# Patient Record
Sex: Male | Born: 1935
Health system: Southern US, Community
[De-identification: ages and names within clinical notes are randomized; demographics above are authoritative.]

## PROBLEM LIST (undated history)

## (undated) DIAGNOSIS — N189 Chronic kidney disease, unspecified: Secondary | ICD-10-CM

## (undated) DIAGNOSIS — S82401A Unspecified fracture of shaft of right fibula, initial encounter for closed fracture: Secondary | ICD-10-CM

## (undated) DIAGNOSIS — T884XXA Failed or difficult intubation, initial encounter: Secondary | ICD-10-CM

## (undated) DIAGNOSIS — R296 Repeated falls: Secondary | ICD-10-CM

## (undated) DIAGNOSIS — T4145XA Adverse effect of unspecified anesthetic, initial encounter: Secondary | ICD-10-CM

## (undated) DIAGNOSIS — E7800 Pure hypercholesterolemia, unspecified: Secondary | ICD-10-CM

## (undated) DIAGNOSIS — E039 Hypothyroidism, unspecified: Secondary | ICD-10-CM

## (undated) DIAGNOSIS — Z9289 Personal history of other medical treatment: Secondary | ICD-10-CM

## (undated) DIAGNOSIS — I2510 Atherosclerotic heart disease of native coronary artery without angina pectoris: Secondary | ICD-10-CM

## (undated) DIAGNOSIS — I82409 Acute embolism and thrombosis of unspecified deep veins of unspecified lower extremity: Secondary | ICD-10-CM

## (undated) DIAGNOSIS — I8390 Asymptomatic varicose veins of unspecified lower extremity: Secondary | ICD-10-CM

## (undated) DIAGNOSIS — J302 Other seasonal allergic rhinitis: Secondary | ICD-10-CM

## (undated) DIAGNOSIS — R32 Unspecified urinary incontinence: Secondary | ICD-10-CM

## (undated) DIAGNOSIS — I499 Cardiac arrhythmia, unspecified: Secondary | ICD-10-CM

## (undated) DIAGNOSIS — J439 Emphysema, unspecified: Secondary | ICD-10-CM

## (undated) DIAGNOSIS — IMO0001 Reserved for inherently not codable concepts without codable children: Secondary | ICD-10-CM

## (undated) DIAGNOSIS — I4891 Unspecified atrial fibrillation: Secondary | ICD-10-CM

## (undated) DIAGNOSIS — F32A Depression, unspecified: Secondary | ICD-10-CM

## (undated) DIAGNOSIS — R7989 Other specified abnormal findings of blood chemistry: Secondary | ICD-10-CM

## (undated) DIAGNOSIS — N400 Enlarged prostate without lower urinary tract symptoms: Secondary | ICD-10-CM

## (undated) DIAGNOSIS — F419 Anxiety disorder, unspecified: Secondary | ICD-10-CM

## (undated) DIAGNOSIS — T8859XA Other complications of anesthesia, initial encounter: Secondary | ICD-10-CM

## (undated) DIAGNOSIS — K219 Gastro-esophageal reflux disease without esophagitis: Secondary | ICD-10-CM

## (undated) DIAGNOSIS — R55 Syncope and collapse: Secondary | ICD-10-CM

## (undated) DIAGNOSIS — R011 Cardiac murmur, unspecified: Secondary | ICD-10-CM

## (undated) DIAGNOSIS — J449 Chronic obstructive pulmonary disease, unspecified: Secondary | ICD-10-CM

## (undated) DIAGNOSIS — I10 Essential (primary) hypertension: Secondary | ICD-10-CM

## (undated) DIAGNOSIS — M722 Plantar fascial fibromatosis: Secondary | ICD-10-CM

## (undated) DIAGNOSIS — F329 Major depressive disorder, single episode, unspecified: Secondary | ICD-10-CM

## (undated) DIAGNOSIS — M1990 Unspecified osteoarthritis, unspecified site: Secondary | ICD-10-CM

## (undated) DIAGNOSIS — G4733 Obstructive sleep apnea (adult) (pediatric): Secondary | ICD-10-CM

## (undated) DIAGNOSIS — I351 Nonrheumatic aortic (valve) insufficiency: Secondary | ICD-10-CM

## (undated) DIAGNOSIS — I429 Cardiomyopathy, unspecified: Secondary | ICD-10-CM

## (undated) DIAGNOSIS — R350 Frequency of micturition: Secondary | ICD-10-CM

## (undated) HISTORY — DX: Asymptomatic varicose veins of unspecified lower extremity: I83.90

## (undated) HISTORY — DX: Essential (primary) hypertension: I10

## (undated) HISTORY — DX: Personal history of other medical treatment: Z92.89

## (undated) HISTORY — DX: Nonrheumatic aortic (valve) insufficiency: I35.1

## (undated) HISTORY — DX: Cardiomyopathy, unspecified: I42.9

## (undated) HISTORY — DX: Hypothyroidism, unspecified: E03.9

## (undated) HISTORY — DX: Depression, unspecified: F32.A

## (undated) HISTORY — PX: CARDIAC CATHETERIZATION: SHX172

## (undated) HISTORY — DX: Other specified abnormal findings of blood chemistry: R79.89

## (undated) HISTORY — DX: Other seasonal allergic rhinitis: J30.2

## (undated) HISTORY — DX: Unspecified fracture of shaft of right fibula, initial encounter for closed fracture: S82.401A

## (undated) HISTORY — PX: TONSILLECTOMY: SUR1361

## (undated) HISTORY — PX: NOSE SURGERY: SHX723

## (undated) HISTORY — DX: Syncope and collapse: R55

## (undated) HISTORY — DX: Obstructive sleep apnea (adult) (pediatric): G47.33

## (undated) HISTORY — DX: Pure hypercholesterolemia, unspecified: E78.00

## (undated) HISTORY — PX: OTHER SURGICAL HISTORY: SHX169

## (undated) HISTORY — DX: Plantar fascial fibromatosis: M72.2

## (undated) HISTORY — DX: Major depressive disorder, single episode, unspecified: F32.9

## (undated) HISTORY — PX: HERNIA REPAIR: SHX51

## (undated) HISTORY — PX: ABDOMINAL AORTIC ANEURYSM REPAIR: SUR1152

## (undated) HISTORY — PX: BRAIN SURGERY: SHX531

---

## 1958-08-30 DIAGNOSIS — Z9289 Personal history of other medical treatment: Secondary | ICD-10-CM

## 1958-08-30 HISTORY — DX: Personal history of other medical treatment: Z92.89

## 2006-01-19 ENCOUNTER — Encounter: Admission: RE | Admit: 2006-01-19 | Discharge: 2006-01-19 | Payer: Self-pay | Admitting: Vascular Surgery

## 2006-01-31 ENCOUNTER — Ambulatory Visit (HOSPITAL_COMMUNITY): Admission: RE | Admit: 2006-01-31 | Discharge: 2006-01-31 | Payer: Self-pay | Admitting: Vascular Surgery

## 2006-03-10 ENCOUNTER — Inpatient Hospital Stay (HOSPITAL_COMMUNITY): Admission: RE | Admit: 2006-03-10 | Discharge: 2006-03-14 | Payer: Self-pay | Admitting: Vascular Surgery

## 2006-03-10 ENCOUNTER — Encounter (INDEPENDENT_AMBULATORY_CARE_PROVIDER_SITE_OTHER): Payer: Self-pay | Admitting: Specialist

## 2006-04-11 ENCOUNTER — Inpatient Hospital Stay (HOSPITAL_COMMUNITY): Admission: EM | Admit: 2006-04-11 | Discharge: 2006-04-13 | Payer: Self-pay | Admitting: Emergency Medicine

## 2007-06-13 ENCOUNTER — Ambulatory Visit: Payer: Self-pay | Admitting: Vascular Surgery

## 2008-11-06 ENCOUNTER — Encounter: Admission: RE | Admit: 2008-11-06 | Discharge: 2008-11-06 | Payer: Self-pay | Admitting: Specialist

## 2009-07-22 ENCOUNTER — Encounter: Admission: RE | Admit: 2009-07-22 | Discharge: 2009-07-22 | Payer: Self-pay | Admitting: Surgery

## 2009-08-30 HISTORY — PX: LAPAROSCOPIC CHOLECYSTECTOMY: SUR755

## 2009-09-16 ENCOUNTER — Ambulatory Visit (HOSPITAL_COMMUNITY): Admission: RE | Admit: 2009-09-16 | Discharge: 2009-09-17 | Payer: Self-pay | Admitting: Surgery

## 2009-09-16 ENCOUNTER — Encounter (INDEPENDENT_AMBULATORY_CARE_PROVIDER_SITE_OTHER): Payer: Self-pay | Admitting: Surgery

## 2009-10-10 ENCOUNTER — Emergency Department (HOSPITAL_COMMUNITY): Admission: EM | Admit: 2009-10-10 | Discharge: 2009-10-11 | Payer: Self-pay | Admitting: Emergency Medicine

## 2009-11-08 ENCOUNTER — Inpatient Hospital Stay (HOSPITAL_COMMUNITY): Admission: EM | Admit: 2009-11-08 | Discharge: 2009-11-15 | Payer: Self-pay | Admitting: Emergency Medicine

## 2009-11-28 HISTORY — PX: ERCP: SHX60

## 2009-12-03 ENCOUNTER — Ambulatory Visit (HOSPITAL_COMMUNITY): Admission: RE | Admit: 2009-12-03 | Discharge: 2009-12-03 | Payer: Self-pay | Admitting: Gastroenterology

## 2010-02-27 HISTORY — PX: LAPAROSCOPIC INCISIONAL / UMBILICAL / VENTRAL HERNIA REPAIR: SUR789

## 2010-03-27 ENCOUNTER — Inpatient Hospital Stay (HOSPITAL_COMMUNITY): Admission: RE | Admit: 2010-03-27 | Discharge: 2010-03-30 | Payer: Self-pay | Admitting: Surgery

## 2010-09-02 ENCOUNTER — Encounter
Admission: RE | Admit: 2010-09-02 | Discharge: 2010-09-02 | Payer: Self-pay | Source: Home / Self Care | Attending: Family Medicine | Admitting: Family Medicine

## 2010-11-14 LAB — CBC
HCT: 36.8 % — ABNORMAL LOW (ref 39.0–52.0)
HCT: 41.5 % (ref 39.0–52.0)
MCHC: 34.4 g/dL (ref 30.0–36.0)
MCV: 92.3 fL (ref 78.0–100.0)
Platelets: 180 10*3/uL (ref 150–400)
Platelets: 194 10*3/uL (ref 150–400)
RBC: 4.5 MIL/uL (ref 4.22–5.81)
RDW: 13.1 % (ref 11.5–15.5)
WBC: 6.2 10*3/uL (ref 4.0–10.5)
WBC: 6.4 10*3/uL (ref 4.0–10.5)

## 2010-11-14 LAB — BASIC METABOLIC PANEL
BUN: 24 mg/dL — ABNORMAL HIGH (ref 6–23)
Calcium: 8.6 mg/dL (ref 8.4–10.5)
Creatinine, Ser: 1.89 mg/dL — ABNORMAL HIGH (ref 0.4–1.5)
GFR calc non Af Amer: 35 mL/min — ABNORMAL LOW (ref >60)
Glucose, Bld: 86 mg/dL (ref 70–99)
Potassium: 4.2 mEq/L (ref 3.5–5.1)

## 2010-11-14 LAB — DIFFERENTIAL
Basophils Absolute: 0 10*3/uL (ref 0.0–0.1)
Basophils Relative: 1 % (ref 0–1)
Eosinophils Absolute: 0.1 10*3/uL (ref 0.0–0.7)
Monocytes Absolute: 0.4 10*3/uL (ref 0.1–1.0)
Neutro Abs: 3.4 10*3/uL (ref 1.7–7.7)
Neutrophils Relative %: 54 % (ref 43–77)

## 2010-11-14 LAB — COMPREHENSIVE METABOLIC PANEL
AST: 22 U/L (ref 0–37)
Alkaline Phosphatase: 72 U/L (ref 39–117)
Chloride: 108 mEq/L (ref 96–112)
GFR calc non Af Amer: 36 mL/min — ABNORMAL LOW (ref >60)
Potassium: 4.5 mEq/L (ref 3.5–5.1)
Sodium: 141 mEq/L (ref 135–145)
Total Bilirubin: 0.9 mg/dL (ref 0.3–1.2)

## 2010-11-14 LAB — SURGICAL PCR SCREEN: Staphylococcus aureus: NEGATIVE

## 2010-11-16 LAB — DIFFERENTIAL
Basophils Relative: 0 % (ref 0–1)
Eosinophils Absolute: 0.1 10*3/uL (ref 0.0–0.7)
Eosinophils Relative: 2 % (ref 0–5)
Lymphs Abs: 2.3 10*3/uL (ref 0.7–4.0)
Neutrophils Relative %: 53 % (ref 43–77)

## 2010-11-16 LAB — COMPREHENSIVE METABOLIC PANEL
ALT: 21 U/L (ref 0–53)
AST: 23 U/L (ref 0–37)
Alkaline Phosphatase: 72 U/L (ref 39–117)
CO2: 27 mEq/L (ref 19–32)
Calcium: 9.2 mg/dL (ref 8.4–10.5)
Chloride: 106 mEq/L (ref 96–112)
GFR calc Af Amer: 52 mL/min — ABNORMAL LOW (ref >60)
GFR calc non Af Amer: 43 mL/min — ABNORMAL LOW (ref >60)
Glucose, Bld: 84 mg/dL (ref 70–99)
Potassium: 4.5 mEq/L (ref 3.5–5.1)
Sodium: 141 mEq/L (ref 135–145)

## 2010-11-16 LAB — CBC
Hemoglobin: 13.7 g/dL (ref 13.0–17.0)
RBC: 4.43 MIL/uL (ref 4.22–5.81)
WBC: 6 10*3/uL (ref 4.0–10.5)

## 2010-11-18 LAB — COMPREHENSIVE METABOLIC PANEL
ALT: 31 U/L (ref 0–53)
AST: 41 U/L — ABNORMAL HIGH (ref 0–37)
Albumin: 3.4 g/dL — ABNORMAL LOW (ref 3.5–5.2)
Calcium: 8.9 mg/dL (ref 8.4–10.5)
Chloride: 107 mEq/L (ref 96–112)
Creatinine, Ser: 1.86 mg/dL — ABNORMAL HIGH (ref 0.4–1.5)
GFR calc Af Amer: 43 mL/min — ABNORMAL LOW (ref >60)
Sodium: 136 mEq/L (ref 135–145)

## 2010-11-18 LAB — URINALYSIS, ROUTINE W REFLEX MICROSCOPIC
Bilirubin Urine: NEGATIVE
Glucose, UA: NEGATIVE mg/dL
Hgb urine dipstick: NEGATIVE
Nitrite: NEGATIVE
Specific Gravity, Urine: 1.028 (ref 1.005–1.030)
pH: 5 (ref 5.0–8.0)

## 2010-11-18 LAB — DIFFERENTIAL
Eosinophils Absolute: 0.1 10*3/uL (ref 0.0–0.7)
Eosinophils Relative: 1 % (ref 0–5)
Lymphocytes Relative: 19 % (ref 12–46)
Lymphs Abs: 1.5 10*3/uL (ref 0.7–4.0)
Monocytes Absolute: 0.3 10*3/uL (ref 0.1–1.0)

## 2010-11-18 LAB — CBC
MCHC: 34.4 g/dL (ref 30.0–36.0)
MCV: 92.2 fL (ref 78.0–100.0)
Platelets: 272 10*3/uL (ref 150–400)
RBC: 4.17 MIL/uL — ABNORMAL LOW (ref 4.22–5.81)
WBC: 8.1 10*3/uL (ref 4.0–10.5)

## 2010-11-18 LAB — POCT CARDIAC MARKERS
CKMB, poc: 2.2 ng/mL (ref 1.0–8.0)
Myoglobin, poc: 140 ng/mL (ref 12–200)
Troponin i, poc: <0.05 ng/mL (ref 0.00–0.09)

## 2010-11-22 LAB — DIFFERENTIAL
Basophils Absolute: 0 10*3/uL (ref 0.0–0.1)
Eosinophils Absolute: 0 10*3/uL (ref 0.0–0.7)
Eosinophils Relative: 0 % (ref 0–5)
Eosinophils Relative: 1 % (ref 0–5)
Lymphocytes Relative: 15 % (ref 12–46)
Lymphs Abs: 1.2 10*3/uL (ref 0.7–4.0)
Lymphs Abs: 2 10*3/uL (ref 0.7–4.0)
Monocytes Relative: 5 % (ref 3–12)
Neutrophils Relative %: 78 % — ABNORMAL HIGH (ref 43–77)

## 2010-11-22 LAB — HEPATIC FUNCTION PANEL
ALT: 64 U/L — ABNORMAL HIGH (ref 0–53)
AST: 62 U/L — ABNORMAL HIGH (ref 0–37)
Bilirubin, Direct: 0.4 mg/dL — ABNORMAL HIGH (ref 0.0–0.3)
Indirect Bilirubin: 0.3 mg/dL (ref 0.3–0.9)
Total Bilirubin: 0.7 mg/dL (ref 0.3–1.2)
Total Bilirubin: 1.3 mg/dL — ABNORMAL HIGH (ref 0.3–1.2)
Total Protein: 6 g/dL (ref 6.0–8.3)

## 2010-11-22 LAB — COMPREHENSIVE METABOLIC PANEL
ALT: 216 U/L — ABNORMAL HIGH (ref 0–53)
AST: 171 U/L — ABNORMAL HIGH (ref 0–37)
AST: 22 U/L (ref 0–37)
AST: 246 U/L — ABNORMAL HIGH (ref 0–37)
Albumin: 2.5 g/dL — ABNORMAL LOW (ref 3.5–5.2)
BUN: 13 mg/dL (ref 6–23)
BUN: 13 mg/dL (ref 6–23)
CO2: 22 mEq/L (ref 19–32)
CO2: 26 mEq/L (ref 19–32)
Calcium: 8.3 mg/dL — ABNORMAL LOW (ref 8.4–10.5)
Calcium: 8.3 mg/dL — ABNORMAL LOW (ref 8.4–10.5)
Calcium: 9.2 mg/dL (ref 8.4–10.5)
Chloride: 103 mEq/L (ref 96–112)
Chloride: 99 mEq/L (ref 96–112)
Creatinine, Ser: 1.3 mg/dL (ref 0.4–1.5)
Creatinine, Ser: 1.38 mg/dL (ref 0.4–1.5)
GFR calc Af Amer: 53 mL/min — ABNORMAL LOW (ref >60)
GFR calc Af Amer: >60 mL/min (ref >60)
GFR calc non Af Amer: 44 mL/min — ABNORMAL LOW (ref >60)
GFR calc non Af Amer: 50 mL/min — ABNORMAL LOW (ref >60)
Glucose, Bld: 87 mg/dL (ref 70–99)
Potassium: 3.7 mEq/L (ref 3.5–5.1)
Sodium: 137 mEq/L (ref 135–145)
Total Bilirubin: 1 mg/dL (ref 0.3–1.2)
Total Protein: 6.1 g/dL (ref 6.0–8.3)

## 2010-11-22 LAB — CK TOTAL AND CKMB (NOT AT ARMC)
CK, MB: 2.1 ng/mL (ref 0.3–4.0)
Relative Index: INVALID (ref 0.0–2.5)

## 2010-11-22 LAB — CBC
HCT: 38.8 % — ABNORMAL LOW (ref 39.0–52.0)
Hemoglobin: 13.1 g/dL (ref 13.0–17.0)
MCHC: 33.9 g/dL (ref 30.0–36.0)
MCHC: 34.2 g/dL (ref 30.0–36.0)
MCHC: 34.8 g/dL (ref 30.0–36.0)
MCV: 93.7 fL (ref 78.0–100.0)
MCV: 93.9 fL (ref 78.0–100.0)
Platelets: 244 10*3/uL (ref 150–400)
RBC: 4.13 MIL/uL — ABNORMAL LOW (ref 4.22–5.81)
RBC: 4.23 MIL/uL (ref 4.22–5.81)
RDW: 14.7 % (ref 11.5–15.5)
WBC: 6.9 10*3/uL (ref 4.0–10.5)
WBC: 7.7 10*3/uL (ref 4.0–10.5)
WBC: 8.1 10*3/uL (ref 4.0–10.5)

## 2010-11-22 LAB — URINALYSIS, ROUTINE W REFLEX MICROSCOPIC
Glucose, UA: NEGATIVE mg/dL
Hgb urine dipstick: NEGATIVE
Specific Gravity, Urine: 1.01 (ref 1.005–1.030)

## 2010-11-22 LAB — RAPID URINE DRUG SCREEN, HOSP PERFORMED
Amphetamines: NOT DETECTED
Barbiturates: NOT DETECTED
Benzodiazepines: NOT DETECTED
Opiates: NOT DETECTED

## 2010-11-22 LAB — HEPATITIS PANEL, ACUTE: Hep B C IgM: NEGATIVE

## 2010-11-22 LAB — APTT: aPTT: 30 seconds (ref 24–37)

## 2010-11-22 LAB — LIPASE, BLOOD
Lipase: 1429 U/L — ABNORMAL HIGH (ref 11–59)
Lipase: 165 U/L — ABNORMAL HIGH (ref 11–59)
Lipase: 6078 U/L — ABNORMAL HIGH (ref 11–59)

## 2011-01-05 ENCOUNTER — Other Ambulatory Visit: Payer: Self-pay | Admitting: Family Medicine

## 2011-01-05 DIAGNOSIS — I714 Abdominal aortic aneurysm, without rupture: Secondary | ICD-10-CM

## 2011-01-08 ENCOUNTER — Other Ambulatory Visit: Payer: Self-pay | Admitting: Family Medicine

## 2011-01-08 ENCOUNTER — Ambulatory Visit
Admission: RE | Admit: 2011-01-08 | Discharge: 2011-01-08 | Disposition: A | Discharge status: Home / Self Care | Payer: Medicare Other | Source: Ambulatory Visit | Attending: Family Medicine | Admitting: Family Medicine

## 2011-01-08 ENCOUNTER — Other Ambulatory Visit: Payer: Medicare Other

## 2011-01-08 DIAGNOSIS — Z8679 Personal history of other diseases of the circulatory system: Secondary | ICD-10-CM

## 2011-01-08 DIAGNOSIS — I714 Abdominal aortic aneurysm, without rupture, unspecified: Secondary | ICD-10-CM

## 2011-01-08 DIAGNOSIS — Z9889 Other specified postprocedural states: Secondary | ICD-10-CM

## 2011-01-12 NOTE — Assessment & Plan Note (Signed)
OFFICE VISIT   MADOC, HOLQUIN  DOB:  [birth year]                                       06/13/2007  GEXBM#:[id number]   I saw the patient in the office today for continued followup after  aneurysm repair back in July of 2007.  Since I saw him last, he has had  no significant abdominal or back pain.  He did injure his left calf  muscle and tore this.   REVIEW OF SYSTEMS:  He has had no chest pain, chest pressure,  palpitations, or arrhythmias.  He does admit to some dyspnea on  exertion.  PULMONARY:  He has had no bronchitis, asthma, or wheezing.   PHYSICAL EXAMINATION:  Blood pressure 127/79, heart rate is 76.  I do  not detect any carotid bruits.  Lungs are clear bilaterally to  auscultation.  On cardiac exam, he has a regular rate and rhythm.  Abdomen is obese.  His abdomen is nontender.  He does have a ventral  hernia, which is easily reducible.  He has palpable femoral pulses and  palpable pedal pulses on the right, the left foot is in a splint.   Overall, I am pleased with his progress and I think he has done well  from the standpoint of repair of his aneurysm.  I will see him back  p.r.n.  I have stressed the importance of receiving prophylactic  antibiotics for any invasive procedures.   Di Kindle. Edilia Bo, M.D.  Electronically Signed   CSD/MEDQ  D:  06/13/2007  T:  06/14/2007  Job:  [id number]

## 2011-01-15 NOTE — Discharge Summary (Signed)
NAMEALBINO, [name]                ACCOUNT NO.:  [id number]   MEDICAL RECORD NO.:  [id number]          PATIENT TYPE:  INP   LOCATION:  2008                         FACILITY:  [facility]   PHYSICIAN:  Di Kindle. Edilia Bo, M.D.DATE OF BIRTH:  04-03-1932   DATE OF ADMISSION:  03/10/2006  DATE OF DISCHARGE:                                 DISCHARGE SUMMARY   PRIMARY DIAGNOSIS:  5.5 cm abdominal aortic aneurysm.   SECONDARY DIAGNOSES:  1.  Hypertension.  2.  History of obesity.  3.  Hyperlipidemia.   HOSPITAL OPERATIONS AND PROCEDURES:  Repair of abdominal aortic aneurysm  with 60 mm Dacron tube graft.   HISTORY AND PHYSICAL AND HOSPITAL COURSE:  The patient is a pleasant 77-year-  old Caucasian male who was seen originally by Dr. Edilia Bo in November 2006  with a 4.5 cm abdominal aortic aneurysm.  At that time, it was recommended  to follow up CT scan in 6 months.  When the patient returned in May for a  follow-up, the aneurysm had enlarged to 5.5 cm.  He subsequently had  undergone cardiac workup with Cardiolite by Cardiology which showed a normal  stress nuclear test with no ischemia.  There was normal LV function.  The  patient also underwent arteriogram.  Due to the size of the aneurysm, Dr.  Edilia Bo discussed with the patient undergoing elective repair.  He discussed  the risks and benefits of surgery.  The patient acknowledged understanding  and agreed to proceed.  Surgery was scheduled for March 10, 2006.   The patient was taken to the operating room March 10, 2006, where he  underwent repair of abdominal aortic aneurysm with 16 mm Dacron tube graft.  The patient tolerated this procedure well and was transferred up to the  intensive care unit in stable condition.  Following surgery, the patient was  seen to be hemodynamically stable.  Hematocrit was at 37%.  The patient was  extubated late evening following surgery.  Following extubation, the patient  was seen to be alert and  oriented x3.  Neuro intact.  The patient's  postoperative course was pretty much unremarkable.  He was transferred out  to __________  postop day #1.  He was out of bed ambulating well.  Nasogastric tube was DCd postop day #2 and the patient was started on clear  liquid diet due to the patient's return of bowel sounds.  The patient  tolerated clear liquids without any nausea, vomiting.  He received a  Dulcolax suppository postop day #2.  The patient was able to have a bowel  movement following.  By postop day #3, the patient was doing well.  He was  advanced to a regular diet.  The patient had no nausea or vomiting following  initiation of regular diet.  He continued to increase activity.  Vital signs  were monitored and seen to be stable.  He was able to be weaned off oxygen  sating greater than 90% on room air.  The patient was afebrile during  postoperative course.  Incisions are clean, dry and intact and healing well.  Bilateral extremities are warm and well-perfused.  The patient remained  hemodynamically stable.   The patient is tentatively ready for discharge home March 14, 2006, postop  day #4.  A follow-up appointment will be arranged with Dr. Edilia Bo in 3  weeks.  Office will contact the patient with this information.  An  appointment with the nurse for staple removal will be in 2 weeks and office  will again contact the patient with this information.  [name] received  instructions on diet, activity and incisional care.  He was told no driving  until released to do so, no heavy lifting over 10 pounds.  The patient is  told he is allowed to shower washing his incisions using soap and water.  He  is to contact the office if he develops any drainage or opening from any of  his incision sites.  He was told to ambulate 3-4 times per day, progress as  tolerated, and to continue his breathing exercises.  The patient was  educated on diet to be low-fat, low-salt.  He acknowledges  understanding.   DISCHARGE MEDICATIONS:  1.  Tylox 1-2 tabs q.4-6 h p.r.n. pain.  2.  Flomax 0.4 mg daily.  3.  Lexapro 30 mg daily.  4.  Synthroid 175 mcg daily.  5.  Lisinopril 40 mg daily.  6.  Enteric-coated aspirin 81 mg daily.  7.  Potassium, magnesium daily.  8.  Multivitamin daily.  9.  Astelin nasal spray as used at home.  10. Advair as used at home.  11. Fish oil daily.  12. Vitamin B daily.      Theda Belfast, PA      Di Kindle. Edilia Bo, M.D.  Electronically Signed    KMD/MEDQ  D:  03/13/2006  T:  03/13/2006  Job:  [id number]
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
## 2011-01-15 NOTE — Op Note (Signed)
NAMEDEMETRIO, LEIGHTY                ACCOUNT NO.:  [id number]   MEDICAL RECORD NO.:  [id number]          PATIENT TYPE:  INP   LOCATION:  2550                         FACILITY:  [facility]   PHYSICIAN:  Di Kindle. Edilia Bo, M.D.DATE OF BIRTH:  [birth year]   DATE OF PROCEDURE:  03/10/2006  DATE OF DISCHARGE:                                 OPERATIVE REPORT   PREOPERATIVE DIAGNOSIS:  5.5 cm abdominal aortic aneurysm.   POSTOPERATIVE DIAGNOSIS:  5.5 cm abdominal aortic aneurysm.   PROCEDURE:  Repair of abdominal aortic aneurysm with 16 mm Dacron tube  graft.   SURGEON:  Di Kindle. Edilia Bo, M.D.   ASSISTANT:  Rowe Clack, P.A.-C.   ANESTHESIA:  General.   INDICATIONS:  This is a 77 year old gentleman whom I had originally seen in  November 2006 with a 75 cm aneurysm.  On a 75-month follow-up study, this  had enlarged to 5.5 cm.  Given the approximate 5-10% per year risk of  rupture, elective repair was recommended.  We discussed open repair versus  endovascular repair, and after extensive discussion ultimately the patient  elected to proceed with open repair.  The procedure and potential  complications had been discussed with the patient preoperatively.  All of  his questions were answered, and he was agreeable to proceed.   TECHNIQUE:  The patient was taken to the operating room after [place]  catheter and arterial line were placed by anesthesia.  The patient received  a general anesthetic.  Of note, the intubation was somewhat difficult, and a  stylet was used, but the patient was intubated uneventfully.  The groins  were then prepped and draped in the usual sterile fashion.  The abdomen was  entered via a midline incision, and on exploration no other intra-abdominal  pathology was noted except for the large abdominal aortic aneurysm.  The  patient was quite obese and required extensive dissection of the  retroperitoneal tissue to get down to the aorta. The neck of the  aneurysm  which was quite long was dissected free up to the level of the renal  arteries.  This was dissected free circumferentially, and an umbilical tape  was passed around the neck.  The dissection was continued down to the  bifurcation.  The aneurysm ended above the bifurcation, and it looked that  we could sew a tube graft.  There was some calcium distally, so I elected to  clamp it distal.  It looks like there was an area to clamp above the  bifurcation.  The patient was then heparinized and received 25 grams of  mannitol.  The infrarenal aorta was clamped, and then the distal aorta was  clamped.  The aneurysm was opened, and lumbars were oversewn with 2-0 silk  ties.  Proximal neck was T-ed off circumferentially, as was the distal  aorta.  A 16 mm graft was selected and was sewn end-to-end to the infrarenal  aorta using a felt cuff with 3-0 Prolene suture.  The proximal anastomosis  was tested and was hemostatic, and the graft was flushed.  The graft was  then pulled  to the appropriate length.  The aortic aorta was then reclamped,  and then the graft pulled for anastomosis to the distal aorta.  The graft  was cut to the appropriate length and sewn end-to-end to the distal aorta  using continuous 3-0 Prolene suture.  Again, prior to completing the  anastomosis, the arteries were backbled and flushed appropriately and the  anastomosis completed.  Flow was reestablished to the leg which the patient  tolerated from a hemodynamic standpoint.  Hemostasis was obtained in the  wounds, and the heparin was reversed with protamine.  The IMA had been  ligated.  The aneurysm sac was closed over the graft with running 2-0 Vicryl  suture.  The retroperitoneal tissue was closed with running 2-0 Vicryl  suture.  The abdominal contents were returned to their normal position, and  the fascial layer was closed with two #1 PDS sutures.  The skin was closed  with staples.  Subcutaneous layer was closed  with running 3-0 Vicryl.  Sterile dressing was applied.  The patient tolerated the procedure well and  was transferred to the recovery room in satisfactory condition.  All needle  and sponge counts were correct.      Di Kindle. Edilia Bo, M.D.  Electronically Signed     CSD/MEDQ  D:  03/10/2006  T:  03/10/2006  Job:  [id number]   cc:   Vesta Mixer, M.D.  Fax: [phone]   Lavonda Jumbo, M.D.  Fax: [phone]

## 2011-01-15 NOTE — H&P (Signed)
[name] [name], [name] [name]                ACCOUNT NO.:  [id number]   MEDICAL RECORD NO.:  [id number]          PATIENT TYPE:  INP   LOCATION:  2008                         FACILITY:  [facility]   PHYSICIAN:  Di Kindle. Edilia Bo, M.D.DATE OF BIRTH:  1931-09-24   DATE OF ADMISSION:  03/10/2006  DATE OF DISCHARGE:  03/14/2006                                HISTORY & PHYSICAL   REASON FOR ADMISSION:  Abdominal aortic aneurysm.   HISTORY:  This is a pleasant 78 year old gentleman who I had seen in  consultation in November with a 4.5 cm abdominal aortic aneurysm.  He had  been evaluated for pancreatitis and this was an incidental finding.  I  recommended follow-up ultrasound in 6 months.  He came in for his follow-up  ultrasound on Jan 19, 2006, and at that time he was asymptomatic.  However,  the aneurysm had enlarged to 5.5 cm.  He has had no further problems with  pancreatitis.  He has had no abdominal or back pain.  He was scheduled for  elective surgery after we had discussed endovascular versus open repair.   PAST MEDICAL HISTORY:  1. Hypertension.  2. He denies any history of diabetes, history of previous myocardial      infarction, history of congestive heart failure or history of      emphysema.  3. He does have a history of elevated cholesterol.  4. He is followed by Dr. Duard Brady in [place], [place], with a heart      murmur and apparently has some valvular insufficiency and he was      cleared by Dr. Duard Brady preoperatively.   REVIEW OF SYSTEMS:  GENERAL:  He has had no weight loss, weight gain,  problems with his appetite or fever.  CARDIAC:  He had no chest pain, chest  pressure.  He does admit to dyspnea on exertion.  He had no history of  arrhythmias.  PULMONARY:  He has had no bronchitis, asthma or wheezing.  GI:  He has had no recent change in his bowel habits and has no history of peptic  ulcer disease.  GU: He has had no dysuria or frequency VASCULAR:  He has had  no  claudication, rest pain or nonhealing ulcers.  He has had a history of  phlebitis in the past.  NEURO:  He has occasional dizziness.  He has had no  blackouts, headaches or seizures. ORTHO/SKIN:  He has a history of arthritis  and occasional muscle pain.  PSYCHIATRIC:  History of depression in the  past.  HEMATOLOGIC:  No bleeding problems or clotting disorders.   MEDICATIONS:  1. Flomax 0.4 mg p.o. daily.  2. Lexapro 30 mg p.o. daily.  3. Synthroid 175 mcg p.o. daily.  4. Lisinopril 40 mg p.o. daily.  5. Aspirin 81 mg p.o. daily.   ALLERGIES:  NO KNOWN DRUG ALLERGIES.   SOCIAL HISTORY:  He is married.  He has one child.  He quit tobacco 25 years  ago.   FAMILY HISTORY:  No history of premature cardiovascular disease.   PHYSICAL EXAMINATION:  VITAL SIGNS:  Blood pressure 120/70, heart rate 72.  NECK:  Do not detect any carotid bruits.  LUNGS:  Clear bilaterally to auscultation.  CARDIAC:  He has a regular rate and rhythm.  ABDOMEN:  Soft and nontender.  His aneurysm is nontender.  VASCULAR:  He has palpable femoral, popliteal and posterior tibial pulses  bilaterally.  EXTREMITIES:  He has no significant lower extremity swelling.   This patient presents with a 5.5 cm abdominal aortic aneurysm which is  enlarged fairly significantly over a six month period.  Elective repair was  recommended and after extensive discussion, he elected proceed with open  repair.  He underwent preoperative cardiac clearance and scheduled for  elective surgery.  The procedure and potential complications were discussed  with the patient preoperatively, all his questions were answered and he is  agreeable to proceed.      Di Kindle. Edilia Bo, M.D.  Electronically Signed     CSD/MEDQ  D:  05/17/2006  T:  05/17/2006  Job:  [id number]

## 2011-01-15 NOTE — H&P (Signed)
[name] [name], [name] [name]                ACCOUNT NO.:  [id number]   MEDICAL RECORD NO.:  [id number]          PATIENT TYPE:  INP   LOCATION:  4733                         FACILITY:  [facility]   PHYSICIAN:  Kela Millin, M.D.DATE OF BIRTH:  08/12/1932   DATE OF ADMISSION:  04/11/2006  DATE OF DISCHARGE:                                HISTORY & PHYSICAL   PRIMARY CARE PHYSICIAN:  Lavonda Jumbo, M.D.   CHIEF COMPLAINT:  Upper abdominal pain.   HISTORY OF PRESENT ILLNESS:  The patient is a 77 year old white male with  past medical history significant for hypertension, hypothyroidism,  hyperlipidemia and obesity who presents with complaints of upper abdominal  pain x30 minutes.  He describes the pain as a cramp, 10/10 in intensity,  associated with eating and shortness of breath.  He admits that the pain  radiated to his back.  He denies nausea, vomiting, diarrhea, fevers,  dysuria, melena.  No hematochezia.  The patient denies chest pain.  He  states that he had surgery about a month ago by Dr. Edilia Bo for an aortic  aneurysm and he has had some lower abdominal pain and some pain around the  incision area periodically but this pain today was different from any of  that.  The patient resolved by the time he was seen in the ER.  The patient  also states that he was diagnosed with pancreatitis in the past and was told  that it was caused by a virus.  He denies alcohol.   In the ER, the patient's lab work showed a mildly elevated lipase at 60 and  his urinalysis was negative for infection.  D-dimer was elevated at 1.26.  He is admitted to the [facility] for further evaluation and  management.   PAST MEDICAL HISTORY:  1. As stated above.  2. History of pancreatitis.  3. History of BPH.  4. History of depression.   MEDICATIONS:  1. Aspirin 81 mg.  2. Flomax 0.4 mg daily.  3. Lexapro 30 mg.  4. Lisinopril 40 mg daily.  5. Synthroid 175 mcg.   ALLERGIES:  NKDA.   SOCIAL  HISTORY:  He denies alcohol.  States that he quit tobacco about 30  years ago.   FAMILY HISTORY:  Father deceased at age 76 with coronary artery disease and  renal dysfunction.   REVIEW OF SYSTEMS:  As per HPI.   PHYSICAL EXAMINATION:  GENERAL:  The patient is a pleasant, obese, elderly  white male.  Alert and oriented in no apparent distress.  VITAL SIGNS:  Temperature is 98.2, blood pressure is 153/74 with a pulse of  67, respiratory rate 22.  O2 saturation of 97%.  HEENT:  PERRL.  EOMI.  Sclerae anicteric.  Moist mucus membranes.  No oral  exudates.  NECK:  Supple.  No adenopathy.  No thyromegaly.  No JVD.  LUNGS:  Clear to auscultation bilaterally.  No crackles and no wheezes.  CARDIOVASCULAR:  Regular rate and rhythm.  Normal S1 and S2 nor S3  appreciated.  ABDOMEN:  Obese.  He has a well-healed midline  scar, soft, nondistended.  Left upper quadrant tenderness.  No rebound tenderness.  Also a mild right  lower quadrant tenderness.  No rebound tenderness.  No masses palpable and  no organomegaly.  EXTREMITIES:  No cyanosis and no edema.  NEUROLOGICAL:  Alert and oriented x3.  Cranial nerves II through XII grossly  intact.  Nonfocal exam.   LABORATORY DATA:  Urinalysis:  Specific gravity 1.020, leukocyte esterase is  negative.  Urine WBC is 0 to 2, urine RBC 7 to 10, bacteria rare.  Point of  care markers negative.  D-dimer 1.26.  His sodium is 140, potassium 4.5,  chloride 107, CO2 26, glucose 143, BUN 16, creatinine 1.2, calcium 8.7.  AST  is 87, ALT 59, alkaline phosphate 104.  His amylase is 68, lipase 60, white  cell count 5.8, hemoglobin 12.8, hematocrit 37.4, platelets 184,000.  Neutrophil count 63%.  Abdominal films and chest x-ray:  Elevated right  hemidiaphragm.  No bowel obstruction.  No free air.   ASSESSMENT/PLAN:  1. Pancreatitis, mild:  We will keep the patient n.p.o., pain management,      hydrate with IV fluids.  Obtain an abdominal ultrasound.  Also recheck       amylase and lipase in the morning.  The patient denies alcohol use.  2. Abnormal liver function tests:  Following review, abdominal ultrasound      as above.  Consider further evaluation and management pending results.  3. Elevated d-dimer:  It is noted that the patient is status post      abdominal aortic aneurysm about a month ago.  We will obtain CT      angiogram of chest.  Also obtain cardiac enzymes on follow up.  4. Hypertension:  IV Vasotec while n.p.o.  Follow and resume medications      when appropriate.  5. Hypothyroidism:  Continue Synthroid.  6. Status post abdominal aortic aneurysm repair:  Per Dr. Edilia Bo.      Kela Millin, M.D.  Electronically Signed     ACV/MEDQ  D:  04/12/2006  T:  04/12/2006  Job:  [id number]   cc:   Lavonda Jumbo, M.D.  Di Kindle. Edilia Bo, M.D.

## 2011-01-15 NOTE — Discharge Summary (Addendum)
[name] [name], HENDERSHOTT                ACCOUNT NO.:  [id number]   MEDICAL RECORD NO.:  [id number]          PATIENT TYPE:  INP   LOCATION:  4733                         FACILITY:  [facility]   PHYSICIAN:  Jackie Plum, M.D.DATE OF BIRTH:  [birth year]   DATE OF ADMISSION:  04/11/2006  DATE OF DISCHARGE:                                 DISCHARGE SUMMARY   FINAL DIAGNOSES:  1. Abdominal pain felt to be secondary to pancreatitis probably cause      unrelated.  Resolved.  2. History of hypertension.  3. Hypothyroidism.  4. Dyslipidemia.  5. Obesity.  6. Benign prostatic hypertrophy.  7. Depression.   DISCHARGE MEDICATIONS:  The patient is going to continue his aspirin,  Flomax, Lexapro, lisinopril and Synthroid as previously.   CONSULTANTS:  Not applicable.   PROCEDURES:  Not applicable.   CONDITION ON DISCHARGE:  Improve, satisfactory.   WORKUP OF NOTE:  1. CT scan of the chest, which was done because of in-stent and elevated D-      dimer, indicated no acute cardiopulmonary process.  There was      questionable colicystitis in the setting of abdominal pain on      presentation with indistinctiveness of the gallbladder with      pericholecystic stranding.  2. Followup abdominal ultrasound done on April 12, 2006.  Indicated      gallbladder sludge of fatty liver.  No evidence of acute cholecystitis.      There was poor visualization of the pancreas.   DISCHARGE LABS:  Sodium 138, potassium 4.0, chloride 105, CO2 24, glucose  95, BUN 13, creatinine 1.3.  Total bilirubin 0.7, alkaline phosphatase 107.  SGOT 46, SGPT 82 (peak of alkaline phosphatase was 120 during  hospitalization, peak of AST was 103 and peak of ALT was 107).  Total  protein 5.9, albumin 3.0.  Amylase 60 with lipase of 46.   REASON FOR ADMISSION:  Abdominal pain without any nausea and vomiting,  diarrhea, dysuria or any fever.   HISTORY OF PRESENT ILLNESS:  During my initial rounds with him he had  indicated  that his abdominal pain had actually almost resolved and had  improved significantly by the time he reached the ED.  In the emergency  room, the patient was noted to have a mildly elevated lipase.  His UA was  negative for any infection.  He was therefore admitted by the [facility] for further evaluation and management of his  pancreatitis.  Please see regarding patient's presenting signs and symptoms  and assessment and plan at the time of evaluation and admitting H&P dictated  by Dr. Donna Bernard, dated April 11, 2006.   On admission, the patient was continued on IV supplementation which  supported, ____ QA MARKER: 299 ____.  He   the time I saw the patient, they said the patient's abdomen was  significantly improved in terms of tenderness.  His bowel sounds are present  and was feeling better already.  We started him on liquids and advanced his  diet, which he tolerated very well.  Patient was able to  tolerate food diet  today without any problems.  He is pain free.  His abdomen is soft today.  Bowel sounds are present, moving more active.  There was no rebound  tenderness.  Did not have any guarding on exam today and lab work done today  indicated normal cardiac enzymes with significant improvement in his liver  function testing.  Because of patient's abdominal pain, it is not very  clear, but I believe that respectively the patient might have passed a  stone, which is quite sequential secondary ____ QA MARKER: 360 ____ which  was mild.  He will need continued followup of his liver function testing in  outpatient setting.  Patient to followup with his PCP in 7 to 10 days.  In  the meantime, he will need followup liver function testing at the time.  The  patient has had a previous history of pancreatitis.  He is obese and the  risk of gallstones and it might be expedient to refer him to  gastroenterology for further evaluation in terms of possible avenues  including  cholecystectomy if deemed appropriate, but I will defer this to  PCP, Elesa Massed.  Patient is discharged in stable condition.  On exam today,  he is alert and oriented x3.  Comfortable at discharge.  His discharge blood  pressure was 136/64.  Pulse 61.  Respirations 20.  Temp 98.0 degrees  Fahrenheit.  O2 saturation of 96% on room air.  Clear to auscultation.  Abdomen is soft as dictated above and extremities have no cyanosis.  In  addition to his regular medications, we will prescribe for him to take  Protonix daily.  Patient declines an antidepressant at this time.      Jackie Plum, M.D.  Electronically Signed     GO/MEDQ  D:  04/13/2006  T:  04/13/2006  Job:  [id number]   cc:   Lavonda Jumbo, M.D.

## 2011-01-15 NOTE — Op Note (Signed)
[name], [name]                ACCOUNT NO.:  [id number]   MEDICAL RECORD NO.:  [id number]          PATIENT TYPE:  AMB   LOCATION:  SDS                          FACILITY:  [facility]   PHYSICIAN:  Di Kindle. Edilia Bo, M.D.DATE OF BIRTH:  1931/11/29   DATE OF PROCEDURE:  01/31/2006  DATE OF DISCHARGE:                                 OPERATIVE REPORT   HISTORY:  This is a 77 year old gentleman who was originally seen in  November with a 4.5 cm abdominal aortic aneurysm.  On a six-month follow-up  study this had expanded to 5.5 cm.  He is brought for diagnostic  arteriography in order to plan elective repair and to determine if he is a  candidate for endovascular versus open repair.  The procedure and potential  complications including but not limited to bleeding, arterial injury, dye  reaction, and kidney failure were discussed with the patient.  All his  questions were answered.  He was agreeable to proceed.   TECHNIQUE:  The patient was taken to the PV lab at [facility] and sedated with a  milligram of Versed and 50 mcg of fentanyl.  Both groins were prepped and  draped in usual sterile fashion.  After the skin was infiltrated with 1%  lidocaine, the right common femoral artery was cannulated and guidewire  introduced into the infrarenal aorta under fluoroscopic control.  The 5-  French sheath was introduced over the wire and then the dilator removed.  Pigtail catheter was positioned at the L1 vertebral body and flush aortogram  obtained.  The catheter was then repositioned above the aortic bifurcation.  Oblique iliac projections were obtained.  Bilateral lower extremity runoff  films were then obtained.   FINDINGS:  There are single renal arteries bilaterally with no significant  renal artery stenosis identified.  The neck of the aneurysm measured  approximately 2.5 cm in length.  The aneurysm ends above the aortic  bifurcation.  The common iliac artery, external iliac and hypogastric  artery  on the right are widely patent.  On the left side there is irregular plaque  at the proximal left common iliac artery that does not produce significant  narrowing.  The common iliac artery below this is patent and the hypogastric  and external iliac arteries are patent on the left.   Lateral projection demonstrates that the superior mesenteric artery is  widely patent.  The celiac axis is poorly visualized at its origin, but  appears to have brisk flow through the celiac axis.   The LAO projection demonstrates the ulcerated plaque in the left common  iliac artery the best.  This shows a deeply ulcerated plaque in the proximal  common iliac artery.  Next bilaterally, the common femoral, superficial  femoral and deep femoral arteries are widely patent as are the popliteal  arteries.  Next, there is three-vessel runoff bilaterally though distally  there is poor visualization because of his size.   CONCLUSIONS:  1.  Infrarenal abdominal aortic aneurysm with a 2.5 cm neck below the renal      arteries.  2.  The aneurysm  appears to end above the bifurcation.  3.  Ulcerated plaque in the proximal left common iliac artery but no      significant infrainguinal arterial occlusive disease visualized on this      study.      Di Kindle. Edilia Bo, M.D.  Electronically Signed     CSD/MEDQ  D:  01/31/2006  T:  01/31/2006  Job:  [id number]   cc:   Lavonda Jumbo, M.D.  Fax: [phone]

## 2011-09-06 DIAGNOSIS — N401 Enlarged prostate with lower urinary tract symptoms: Secondary | ICD-10-CM | POA: Diagnosis not present

## 2011-09-08 DIAGNOSIS — N400 Enlarged prostate without lower urinary tract symptoms: Secondary | ICD-10-CM | POA: Insufficient documentation

## 2011-09-08 DIAGNOSIS — K219 Gastro-esophageal reflux disease without esophagitis: Secondary | ICD-10-CM | POA: Insufficient documentation

## 2011-09-08 DIAGNOSIS — I714 Abdominal aortic aneurysm, without rupture: Secondary | ICD-10-CM | POA: Insufficient documentation

## 2011-10-27 DIAGNOSIS — I129 Hypertensive chronic kidney disease with stage 1 through stage 4 chronic kidney disease, or unspecified chronic kidney disease: Secondary | ICD-10-CM | POA: Diagnosis not present

## 2011-10-27 DIAGNOSIS — D649 Anemia, unspecified: Secondary | ICD-10-CM | POA: Diagnosis not present

## 2011-10-27 DIAGNOSIS — N2581 Secondary hyperparathyroidism of renal origin: Secondary | ICD-10-CM | POA: Diagnosis not present

## 2012-01-13 DIAGNOSIS — I10 Essential (primary) hypertension: Secondary | ICD-10-CM | POA: Diagnosis not present

## 2012-01-13 DIAGNOSIS — Z79899 Other long term (current) drug therapy: Secondary | ICD-10-CM | POA: Diagnosis not present

## 2012-01-13 DIAGNOSIS — E7800 Pure hypercholesterolemia, unspecified: Secondary | ICD-10-CM | POA: Diagnosis not present

## 2012-01-27 DIAGNOSIS — E7800 Pure hypercholesterolemia, unspecified: Secondary | ICD-10-CM | POA: Diagnosis not present

## 2012-01-27 DIAGNOSIS — I10 Essential (primary) hypertension: Secondary | ICD-10-CM | POA: Diagnosis not present

## 2012-01-27 DIAGNOSIS — L989 Disorder of the skin and subcutaneous tissue, unspecified: Secondary | ICD-10-CM | POA: Diagnosis not present

## 2012-01-27 DIAGNOSIS — N183 Chronic kidney disease, stage 3 unspecified: Secondary | ICD-10-CM | POA: Diagnosis not present

## 2012-01-27 DIAGNOSIS — G4733 Obstructive sleep apnea (adult) (pediatric): Secondary | ICD-10-CM | POA: Diagnosis not present

## 2012-01-27 DIAGNOSIS — E039 Hypothyroidism, unspecified: Secondary | ICD-10-CM | POA: Diagnosis not present

## 2012-01-27 DIAGNOSIS — E663 Overweight: Secondary | ICD-10-CM | POA: Diagnosis not present

## 2012-02-15 DIAGNOSIS — G4733 Obstructive sleep apnea (adult) (pediatric): Secondary | ICD-10-CM | POA: Diagnosis not present

## 2012-03-09 DIAGNOSIS — F339 Major depressive disorder, recurrent, unspecified: Secondary | ICD-10-CM | POA: Diagnosis not present

## 2012-03-17 DIAGNOSIS — E785 Hyperlipidemia, unspecified: Secondary | ICD-10-CM | POA: Diagnosis not present

## 2012-03-17 DIAGNOSIS — I10 Essential (primary) hypertension: Secondary | ICD-10-CM | POA: Diagnosis not present

## 2012-03-17 DIAGNOSIS — I359 Nonrheumatic aortic valve disorder, unspecified: Secondary | ICD-10-CM | POA: Diagnosis not present

## 2012-03-17 DIAGNOSIS — Z79899 Other long term (current) drug therapy: Secondary | ICD-10-CM | POA: Diagnosis not present

## 2012-03-17 DIAGNOSIS — I714 Abdominal aortic aneurysm, without rupture: Secondary | ICD-10-CM | POA: Diagnosis not present

## 2012-03-17 DIAGNOSIS — R0609 Other forms of dyspnea: Secondary | ICD-10-CM | POA: Diagnosis not present

## 2012-03-26 DIAGNOSIS — F331 Major depressive disorder, recurrent, moderate: Secondary | ICD-10-CM | POA: Diagnosis present

## 2012-03-26 DIAGNOSIS — R0989 Other specified symptoms and signs involving the circulatory and respiratory systems: Secondary | ICD-10-CM | POA: Diagnosis not present

## 2012-03-26 DIAGNOSIS — K219 Gastro-esophageal reflux disease without esophagitis: Secondary | ICD-10-CM | POA: Diagnosis present

## 2012-03-26 DIAGNOSIS — I509 Heart failure, unspecified: Secondary | ICD-10-CM | POA: Diagnosis not present

## 2012-03-26 DIAGNOSIS — R918 Other nonspecific abnormal finding of lung field: Secondary | ICD-10-CM | POA: Diagnosis not present

## 2012-03-26 DIAGNOSIS — J479 Bronchiectasis, uncomplicated: Secondary | ICD-10-CM | POA: Diagnosis not present

## 2012-03-26 DIAGNOSIS — I10 Essential (primary) hypertension: Secondary | ICD-10-CM | POA: Diagnosis present

## 2012-03-26 DIAGNOSIS — R45851 Suicidal ideations: Secondary | ICD-10-CM | POA: Diagnosis not present

## 2012-03-26 DIAGNOSIS — J9819 Other pulmonary collapse: Secondary | ICD-10-CM | POA: Diagnosis not present

## 2012-03-26 DIAGNOSIS — E785 Hyperlipidemia, unspecified: Secondary | ICD-10-CM | POA: Diagnosis present

## 2012-03-26 DIAGNOSIS — N400 Enlarged prostate without lower urinary tract symptoms: Secondary | ICD-10-CM | POA: Diagnosis present

## 2012-03-26 DIAGNOSIS — IMO0002 Reserved for concepts with insufficient information to code with codable children: Secondary | ICD-10-CM | POA: Diagnosis not present

## 2012-03-26 DIAGNOSIS — E039 Hypothyroidism, unspecified: Secondary | ICD-10-CM | POA: Diagnosis present

## 2012-03-26 DIAGNOSIS — I498 Other specified cardiac arrhythmias: Secondary | ICD-10-CM | POA: Diagnosis present

## 2012-03-26 DIAGNOSIS — I359 Nonrheumatic aortic valve disorder, unspecified: Secondary | ICD-10-CM | POA: Diagnosis not present

## 2012-03-26 DIAGNOSIS — R0602 Shortness of breath: Secondary | ICD-10-CM | POA: Diagnosis not present

## 2012-03-27 DIAGNOSIS — I509 Heart failure, unspecified: Secondary | ICD-10-CM | POA: Diagnosis not present

## 2012-03-27 DIAGNOSIS — R0602 Shortness of breath: Secondary | ICD-10-CM | POA: Diagnosis not present

## 2012-03-27 DIAGNOSIS — I359 Nonrheumatic aortic valve disorder, unspecified: Secondary | ICD-10-CM | POA: Diagnosis not present

## 2012-04-18 DIAGNOSIS — I872 Venous insufficiency (chronic) (peripheral): Secondary | ICD-10-CM | POA: Diagnosis not present

## 2012-04-28 DIAGNOSIS — I428 Other cardiomyopathies: Secondary | ICD-10-CM | POA: Diagnosis not present

## 2012-04-28 DIAGNOSIS — I129 Hypertensive chronic kidney disease with stage 1 through stage 4 chronic kidney disease, or unspecified chronic kidney disease: Secondary | ICD-10-CM | POA: Diagnosis not present

## 2012-04-28 DIAGNOSIS — I10 Essential (primary) hypertension: Secondary | ICD-10-CM | POA: Diagnosis not present

## 2012-05-12 DIAGNOSIS — M79609 Pain in unspecified limb: Secondary | ICD-10-CM | POA: Diagnosis not present

## 2012-05-12 DIAGNOSIS — B351 Tinea unguium: Secondary | ICD-10-CM | POA: Diagnosis not present

## 2012-05-15 DIAGNOSIS — I8310 Varicose veins of unspecified lower extremity with inflammation: Secondary | ICD-10-CM | POA: Diagnosis not present

## 2012-05-19 DIAGNOSIS — I359 Nonrheumatic aortic valve disorder, unspecified: Secondary | ICD-10-CM | POA: Diagnosis not present

## 2012-05-19 DIAGNOSIS — I714 Abdominal aortic aneurysm, without rupture: Secondary | ICD-10-CM | POA: Diagnosis not present

## 2012-05-19 DIAGNOSIS — I10 Essential (primary) hypertension: Secondary | ICD-10-CM | POA: Diagnosis not present

## 2012-05-19 DIAGNOSIS — E785 Hyperlipidemia, unspecified: Secondary | ICD-10-CM | POA: Diagnosis not present

## 2012-05-25 DIAGNOSIS — I781 Nevus, non-neoplastic: Secondary | ICD-10-CM | POA: Diagnosis not present

## 2012-05-25 DIAGNOSIS — L219 Seborrheic dermatitis, unspecified: Secondary | ICD-10-CM | POA: Diagnosis not present

## 2012-06-01 DIAGNOSIS — F339 Major depressive disorder, recurrent, unspecified: Secondary | ICD-10-CM | POA: Diagnosis not present

## 2012-06-02 DIAGNOSIS — N401 Enlarged prostate with lower urinary tract symptoms: Secondary | ICD-10-CM | POA: Diagnosis not present

## 2012-06-02 DIAGNOSIS — N433 Hydrocele, unspecified: Secondary | ICD-10-CM | POA: Diagnosis not present

## 2012-06-22 DIAGNOSIS — J019 Acute sinusitis, unspecified: Secondary | ICD-10-CM | POA: Diagnosis not present

## 2012-06-22 DIAGNOSIS — M545 Low back pain, unspecified: Secondary | ICD-10-CM | POA: Diagnosis not present

## 2012-06-22 DIAGNOSIS — E039 Hypothyroidism, unspecified: Secondary | ICD-10-CM | POA: Diagnosis not present

## 2012-06-22 DIAGNOSIS — E7800 Pure hypercholesterolemia, unspecified: Secondary | ICD-10-CM | POA: Diagnosis not present

## 2012-06-22 DIAGNOSIS — I10 Essential (primary) hypertension: Secondary | ICD-10-CM | POA: Diagnosis not present

## 2012-06-22 DIAGNOSIS — Z79899 Other long term (current) drug therapy: Secondary | ICD-10-CM | POA: Diagnosis not present

## 2012-06-22 DIAGNOSIS — Z23 Encounter for immunization: Secondary | ICD-10-CM | POA: Diagnosis not present

## 2012-06-22 DIAGNOSIS — N183 Chronic kidney disease, stage 3 unspecified: Secondary | ICD-10-CM | POA: Diagnosis not present

## 2012-07-04 DIAGNOSIS — F339 Major depressive disorder, recurrent, unspecified: Secondary | ICD-10-CM | POA: Diagnosis not present

## 2012-07-14 DIAGNOSIS — N433 Hydrocele, unspecified: Secondary | ICD-10-CM | POA: Insufficient documentation

## 2012-07-14 DIAGNOSIS — N401 Enlarged prostate with lower urinary tract symptoms: Secondary | ICD-10-CM | POA: Diagnosis not present

## 2012-08-03 DIAGNOSIS — R9431 Abnormal electrocardiogram [ECG] [EKG]: Secondary | ICD-10-CM | POA: Diagnosis not present

## 2012-08-03 DIAGNOSIS — F411 Generalized anxiety disorder: Secondary | ICD-10-CM | POA: Diagnosis not present

## 2012-08-03 DIAGNOSIS — I10 Essential (primary) hypertension: Secondary | ICD-10-CM | POA: Diagnosis not present

## 2012-08-03 DIAGNOSIS — F419 Anxiety disorder, unspecified: Secondary | ICD-10-CM | POA: Insufficient documentation

## 2012-08-03 DIAGNOSIS — K219 Gastro-esophageal reflux disease without esophagitis: Secondary | ICD-10-CM | POA: Diagnosis not present

## 2012-08-03 DIAGNOSIS — F329 Major depressive disorder, single episode, unspecified: Secondary | ICD-10-CM | POA: Diagnosis not present

## 2012-08-03 DIAGNOSIS — G4733 Obstructive sleep apnea (adult) (pediatric): Secondary | ICD-10-CM | POA: Diagnosis not present

## 2012-08-03 DIAGNOSIS — E039 Hypothyroidism, unspecified: Secondary | ICD-10-CM | POA: Diagnosis not present

## 2012-08-08 DIAGNOSIS — F411 Generalized anxiety disorder: Secondary | ICD-10-CM | POA: Diagnosis not present

## 2012-08-08 DIAGNOSIS — Z79899 Other long term (current) drug therapy: Secondary | ICD-10-CM | POA: Diagnosis not present

## 2012-08-08 DIAGNOSIS — N4340 Spermatocele of epididymis, unspecified: Secondary | ICD-10-CM | POA: Diagnosis not present

## 2012-08-08 DIAGNOSIS — G4733 Obstructive sleep apnea (adult) (pediatric): Secondary | ICD-10-CM | POA: Diagnosis not present

## 2012-08-08 DIAGNOSIS — F329 Major depressive disorder, single episode, unspecified: Secondary | ICD-10-CM | POA: Diagnosis not present

## 2012-08-08 DIAGNOSIS — N401 Enlarged prostate with lower urinary tract symptoms: Secondary | ICD-10-CM | POA: Diagnosis not present

## 2012-08-08 DIAGNOSIS — E039 Hypothyroidism, unspecified: Secondary | ICD-10-CM | POA: Diagnosis not present

## 2012-08-08 DIAGNOSIS — N432 Other hydrocele: Secondary | ICD-10-CM | POA: Diagnosis not present

## 2012-08-08 DIAGNOSIS — Z6836 Body mass index (BMI) 36.0-36.9, adult: Secondary | ICD-10-CM | POA: Diagnosis not present

## 2012-08-08 DIAGNOSIS — K219 Gastro-esophageal reflux disease without esophagitis: Secondary | ICD-10-CM | POA: Diagnosis not present

## 2012-08-08 DIAGNOSIS — I10 Essential (primary) hypertension: Secondary | ICD-10-CM | POA: Diagnosis not present

## 2012-08-08 DIAGNOSIS — E669 Obesity, unspecified: Secondary | ICD-10-CM | POA: Diagnosis not present

## 2012-09-02 DIAGNOSIS — S5000XA Contusion of unspecified elbow, initial encounter: Secondary | ICD-10-CM | POA: Diagnosis not present

## 2012-09-20 DIAGNOSIS — S2341XA Sprain of ribs, initial encounter: Secondary | ICD-10-CM | POA: Diagnosis not present

## 2012-10-03 DIAGNOSIS — S20219A Contusion of unspecified front wall of thorax, initial encounter: Secondary | ICD-10-CM | POA: Diagnosis not present

## 2012-10-26 DIAGNOSIS — I129 Hypertensive chronic kidney disease with stage 1 through stage 4 chronic kidney disease, or unspecified chronic kidney disease: Secondary | ICD-10-CM | POA: Diagnosis not present

## 2012-10-26 DIAGNOSIS — D649 Anemia, unspecified: Secondary | ICD-10-CM | POA: Diagnosis not present

## 2012-11-27 DIAGNOSIS — F339 Major depressive disorder, recurrent, unspecified: Secondary | ICD-10-CM | POA: Diagnosis not present

## 2012-11-30 DIAGNOSIS — Z79899 Other long term (current) drug therapy: Secondary | ICD-10-CM | POA: Diagnosis not present

## 2012-11-30 DIAGNOSIS — J019 Acute sinusitis, unspecified: Secondary | ICD-10-CM | POA: Diagnosis not present

## 2012-11-30 DIAGNOSIS — N183 Chronic kidney disease, stage 3 unspecified: Secondary | ICD-10-CM | POA: Diagnosis not present

## 2012-11-30 DIAGNOSIS — N400 Enlarged prostate without lower urinary tract symptoms: Secondary | ICD-10-CM | POA: Diagnosis not present

## 2012-11-30 DIAGNOSIS — G4733 Obstructive sleep apnea (adult) (pediatric): Secondary | ICD-10-CM | POA: Diagnosis not present

## 2012-11-30 DIAGNOSIS — E7800 Pure hypercholesterolemia, unspecified: Secondary | ICD-10-CM | POA: Diagnosis not present

## 2012-11-30 DIAGNOSIS — I10 Essential (primary) hypertension: Secondary | ICD-10-CM | POA: Diagnosis not present

## 2012-11-30 DIAGNOSIS — E039 Hypothyroidism, unspecified: Secondary | ICD-10-CM | POA: Diagnosis not present

## 2012-12-12 DIAGNOSIS — F339 Major depressive disorder, recurrent, unspecified: Secondary | ICD-10-CM | POA: Diagnosis not present

## 2012-12-18 DIAGNOSIS — R05 Cough: Secondary | ICD-10-CM | POA: Diagnosis not present

## 2012-12-18 DIAGNOSIS — J209 Acute bronchitis, unspecified: Secondary | ICD-10-CM | POA: Diagnosis not present

## 2013-01-01 DIAGNOSIS — F339 Major depressive disorder, recurrent, unspecified: Secondary | ICD-10-CM | POA: Diagnosis not present

## 2013-01-26 DIAGNOSIS — H2510 Age-related nuclear cataract, unspecified eye: Secondary | ICD-10-CM | POA: Diagnosis not present

## 2013-01-26 DIAGNOSIS — H524 Presbyopia: Secondary | ICD-10-CM | POA: Diagnosis not present

## 2013-01-26 DIAGNOSIS — H25019 Cortical age-related cataract, unspecified eye: Secondary | ICD-10-CM | POA: Diagnosis not present

## 2013-01-26 DIAGNOSIS — H52209 Unspecified astigmatism, unspecified eye: Secondary | ICD-10-CM | POA: Diagnosis not present

## 2013-02-06 DIAGNOSIS — F339 Major depressive disorder, recurrent, unspecified: Secondary | ICD-10-CM | POA: Diagnosis not present

## 2013-02-16 DIAGNOSIS — Z136 Encounter for screening for cardiovascular disorders: Secondary | ICD-10-CM | POA: Diagnosis not present

## 2013-02-16 DIAGNOSIS — Z0000 Encounter for general adult medical examination without abnormal findings: Secondary | ICD-10-CM | POA: Diagnosis not present

## 2013-02-16 DIAGNOSIS — Z1211 Encounter for screening for malignant neoplasm of colon: Secondary | ICD-10-CM | POA: Diagnosis not present

## 2013-03-05 DIAGNOSIS — E039 Hypothyroidism, unspecified: Secondary | ICD-10-CM | POA: Diagnosis not present

## 2013-03-09 DIAGNOSIS — N433 Hydrocele, unspecified: Secondary | ICD-10-CM | POA: Diagnosis not present

## 2013-04-25 DIAGNOSIS — N2581 Secondary hyperparathyroidism of renal origin: Secondary | ICD-10-CM | POA: Diagnosis not present

## 2013-04-25 DIAGNOSIS — I129 Hypertensive chronic kidney disease with stage 1 through stage 4 chronic kidney disease, or unspecified chronic kidney disease: Secondary | ICD-10-CM | POA: Diagnosis not present

## 2013-04-25 DIAGNOSIS — I10 Essential (primary) hypertension: Secondary | ICD-10-CM | POA: Diagnosis not present

## 2013-04-25 DIAGNOSIS — N183 Chronic kidney disease, stage 3 unspecified: Secondary | ICD-10-CM | POA: Diagnosis not present

## 2013-04-25 DIAGNOSIS — D649 Anemia, unspecified: Secondary | ICD-10-CM | POA: Diagnosis not present

## 2013-05-01 DIAGNOSIS — I8310 Varicose veins of unspecified lower extremity with inflammation: Secondary | ICD-10-CM | POA: Diagnosis not present

## 2013-05-07 DIAGNOSIS — F339 Major depressive disorder, recurrent, unspecified: Secondary | ICD-10-CM | POA: Diagnosis not present

## 2013-08-17 DIAGNOSIS — E039 Hypothyroidism, unspecified: Secondary | ICD-10-CM | POA: Diagnosis not present

## 2013-08-17 DIAGNOSIS — Z23 Encounter for immunization: Secondary | ICD-10-CM | POA: Diagnosis not present

## 2013-08-17 DIAGNOSIS — G4733 Obstructive sleep apnea (adult) (pediatric): Secondary | ICD-10-CM | POA: Diagnosis not present

## 2013-08-17 DIAGNOSIS — I719 Aortic aneurysm of unspecified site, without rupture: Secondary | ICD-10-CM | POA: Diagnosis not present

## 2013-08-17 DIAGNOSIS — E7800 Pure hypercholesterolemia, unspecified: Secondary | ICD-10-CM | POA: Diagnosis not present

## 2013-08-17 DIAGNOSIS — N183 Chronic kidney disease, stage 3 unspecified: Secondary | ICD-10-CM | POA: Diagnosis not present

## 2013-08-17 DIAGNOSIS — N400 Enlarged prostate without lower urinary tract symptoms: Secondary | ICD-10-CM | POA: Diagnosis not present

## 2013-08-17 DIAGNOSIS — G4700 Insomnia, unspecified: Secondary | ICD-10-CM | POA: Diagnosis not present

## 2013-08-17 DIAGNOSIS — I10 Essential (primary) hypertension: Secondary | ICD-10-CM | POA: Diagnosis not present

## 2013-09-05 DIAGNOSIS — I8310 Varicose veins of unspecified lower extremity with inflammation: Secondary | ICD-10-CM | POA: Diagnosis not present

## 2013-09-10 DIAGNOSIS — N401 Enlarged prostate with lower urinary tract symptoms: Secondary | ICD-10-CM | POA: Diagnosis not present

## 2013-09-10 DIAGNOSIS — N139 Obstructive and reflux uropathy, unspecified: Secondary | ICD-10-CM | POA: Diagnosis not present

## 2013-09-10 DIAGNOSIS — R972 Elevated prostate specific antigen [PSA]: Secondary | ICD-10-CM | POA: Diagnosis not present

## 2013-09-24 DIAGNOSIS — N401 Enlarged prostate with lower urinary tract symptoms: Secondary | ICD-10-CM | POA: Diagnosis not present

## 2013-09-25 DIAGNOSIS — K5730 Diverticulosis of large intestine without perforation or abscess without bleeding: Secondary | ICD-10-CM | POA: Diagnosis not present

## 2013-09-25 DIAGNOSIS — Z09 Encounter for follow-up examination after completed treatment for conditions other than malignant neoplasm: Secondary | ICD-10-CM | POA: Diagnosis not present

## 2013-09-25 DIAGNOSIS — Z86010 Personal history of colonic polyps: Secondary | ICD-10-CM | POA: Diagnosis not present

## 2013-09-25 LAB — HM COLONOSCOPY

## 2013-10-02 DIAGNOSIS — I10 Essential (primary) hypertension: Secondary | ICD-10-CM | POA: Diagnosis not present

## 2013-10-02 DIAGNOSIS — N183 Chronic kidney disease, stage 3 unspecified: Secondary | ICD-10-CM | POA: Diagnosis not present

## 2013-10-02 DIAGNOSIS — E785 Hyperlipidemia, unspecified: Secondary | ICD-10-CM | POA: Diagnosis not present

## 2013-10-19 DIAGNOSIS — L820 Inflamed seborrheic keratosis: Secondary | ICD-10-CM | POA: Diagnosis not present

## 2013-10-26 DIAGNOSIS — S5290XA Unspecified fracture of unspecified forearm, initial encounter for closed fracture: Secondary | ICD-10-CM | POA: Diagnosis not present

## 2013-10-26 DIAGNOSIS — S63509A Unspecified sprain of unspecified wrist, initial encounter: Secondary | ICD-10-CM | POA: Diagnosis not present

## 2013-10-29 DIAGNOSIS — S52599A Other fractures of lower end of unspecified radius, initial encounter for closed fracture: Secondary | ICD-10-CM | POA: Diagnosis not present

## 2013-11-06 DIAGNOSIS — F339 Major depressive disorder, recurrent, unspecified: Secondary | ICD-10-CM | POA: Diagnosis not present

## 2013-11-08 DIAGNOSIS — S52599A Other fractures of lower end of unspecified radius, initial encounter for closed fracture: Secondary | ICD-10-CM | POA: Diagnosis not present

## 2013-11-13 DIAGNOSIS — I359 Nonrheumatic aortic valve disorder, unspecified: Secondary | ICD-10-CM | POA: Diagnosis not present

## 2013-11-13 DIAGNOSIS — F17200 Nicotine dependence, unspecified, uncomplicated: Secondary | ICD-10-CM | POA: Diagnosis not present

## 2013-11-13 DIAGNOSIS — E785 Hyperlipidemia, unspecified: Secondary | ICD-10-CM | POA: Diagnosis not present

## 2013-11-13 DIAGNOSIS — I10 Essential (primary) hypertension: Secondary | ICD-10-CM | POA: Diagnosis not present

## 2013-11-22 DIAGNOSIS — S52599A Other fractures of lower end of unspecified radius, initial encounter for closed fracture: Secondary | ICD-10-CM | POA: Diagnosis not present

## 2013-12-11 DIAGNOSIS — S52599A Other fractures of lower end of unspecified radius, initial encounter for closed fracture: Secondary | ICD-10-CM | POA: Diagnosis not present

## 2014-01-10 DIAGNOSIS — S52599A Other fractures of lower end of unspecified radius, initial encounter for closed fracture: Secondary | ICD-10-CM | POA: Diagnosis not present

## 2014-01-31 DIAGNOSIS — F339 Major depressive disorder, recurrent, unspecified: Secondary | ICD-10-CM | POA: Diagnosis not present

## 2014-02-08 DIAGNOSIS — I714 Abdominal aortic aneurysm, without rupture, unspecified: Secondary | ICD-10-CM | POA: Diagnosis not present

## 2014-02-08 DIAGNOSIS — I4891 Unspecified atrial fibrillation: Secondary | ICD-10-CM | POA: Diagnosis not present

## 2014-02-08 DIAGNOSIS — I10 Essential (primary) hypertension: Secondary | ICD-10-CM | POA: Diagnosis not present

## 2014-02-08 DIAGNOSIS — E785 Hyperlipidemia, unspecified: Secondary | ICD-10-CM | POA: Diagnosis not present

## 2014-02-08 DIAGNOSIS — I359 Nonrheumatic aortic valve disorder, unspecified: Secondary | ICD-10-CM | POA: Diagnosis not present

## 2014-02-08 DIAGNOSIS — I499 Cardiac arrhythmia, unspecified: Secondary | ICD-10-CM | POA: Diagnosis not present

## 2014-02-20 DIAGNOSIS — I359 Nonrheumatic aortic valve disorder, unspecified: Secondary | ICD-10-CM | POA: Diagnosis not present

## 2014-02-20 DIAGNOSIS — I10 Essential (primary) hypertension: Secondary | ICD-10-CM | POA: Diagnosis not present

## 2014-02-20 DIAGNOSIS — I4891 Unspecified atrial fibrillation: Secondary | ICD-10-CM | POA: Diagnosis not present

## 2014-04-05 ENCOUNTER — Emergency Department (HOSPITAL_COMMUNITY): Payer: Medicare Other

## 2014-04-05 ENCOUNTER — Emergency Department (HOSPITAL_COMMUNITY)
Admission: EM | Admit: 2014-04-05 | Discharge: 2014-04-05 | Disposition: A | Discharge status: Home / Self Care | Payer: Medicare Other | Attending: Emergency Medicine | Admitting: Emergency Medicine

## 2014-04-05 ENCOUNTER — Encounter (HOSPITAL_COMMUNITY): Payer: Self-pay | Admitting: Emergency Medicine

## 2014-04-05 DIAGNOSIS — R062 Wheezing: Secondary | ICD-10-CM | POA: Diagnosis not present

## 2014-04-05 DIAGNOSIS — IMO0002 Reserved for concepts with insufficient information to code with codable children: Secondary | ICD-10-CM | POA: Insufficient documentation

## 2014-04-05 DIAGNOSIS — I4891 Unspecified atrial fibrillation: Secondary | ICD-10-CM | POA: Diagnosis not present

## 2014-04-05 DIAGNOSIS — Z87891 Personal history of nicotine dependence: Secondary | ICD-10-CM | POA: Diagnosis not present

## 2014-04-05 DIAGNOSIS — R0602 Shortness of breath: Secondary | ICD-10-CM | POA: Diagnosis not present

## 2014-04-05 DIAGNOSIS — Z79899 Other long term (current) drug therapy: Secondary | ICD-10-CM | POA: Diagnosis not present

## 2014-04-05 DIAGNOSIS — J9819 Other pulmonary collapse: Secondary | ICD-10-CM | POA: Diagnosis not present

## 2014-04-05 DIAGNOSIS — J984 Other disorders of lung: Secondary | ICD-10-CM | POA: Diagnosis not present

## 2014-04-05 HISTORY — DX: Unspecified atrial fibrillation: I48.91

## 2014-04-05 LAB — CBC WITH DIFFERENTIAL/PLATELET
BASOS ABS: 0 10*3/uL (ref 0.0–0.1)
BASOS PCT: 0 % (ref 0–1)
Eosinophils Absolute: 0.1 10*3/uL (ref 0.0–0.7)
Eosinophils Relative: 2 % (ref 0–5)
HCT: 42 % (ref 39.0–52.0)
Hemoglobin: 13.8 g/dL (ref 13.0–17.0)
Lymphocytes Relative: 31 % (ref 12–46)
Lymphs Abs: 1.8 10*3/uL (ref 0.7–4.0)
MCH: 30.2 pg (ref 26.0–34.0)
MCHC: 32.9 g/dL (ref 30.0–36.0)
MCV: 91.9 fL (ref 78.0–100.0)
MONO ABS: 0.4 10*3/uL (ref 0.1–1.0)
Monocytes Relative: 7 % (ref 3–12)
NEUTROS ABS: 3.5 10*3/uL (ref 1.7–7.7)
NEUTROS PCT: 60 % (ref 43–77)
PLATELETS: 185 10*3/uL (ref 150–400)
RBC: 4.57 MIL/uL (ref 4.22–5.81)
RDW: 13.6 % (ref 11.5–15.5)
WBC: 5.8 10*3/uL (ref 4.0–10.5)

## 2014-04-05 LAB — BASIC METABOLIC PANEL
Anion gap: 14 (ref 5–15)
BUN: 22 mg/dL (ref 6–23)
CO2: 23 mEq/L (ref 19–32)
Calcium: 9.5 mg/dL (ref 8.4–10.5)
Chloride: 104 mEq/L (ref 96–112)
Creatinine, Ser: 1.55 mg/dL — ABNORMAL HIGH (ref 0.50–1.35)
GFR, EST AFRICAN AMERICAN: 47 mL/min — AB (ref >90)
GFR, EST NON AFRICAN AMERICAN: 41 mL/min — AB (ref >90)
Glucose, Bld: 106 mg/dL — ABNORMAL HIGH (ref 70–99)
POTASSIUM: 4.3 meq/L (ref 3.7–5.3)
SODIUM: 141 meq/L (ref 137–147)

## 2014-04-05 LAB — PRO B NATRIURETIC PEPTIDE: Pro B Natriuretic peptide (BNP): 4566 pg/mL — ABNORMAL HIGH (ref 0–450)

## 2014-04-05 MED ORDER — ALBUTEROL SULFATE (2.5 MG/3ML) 0.083% IN NEBU
5.0000 mg | INHALATION_SOLUTION | Freq: Once | RESPIRATORY_TRACT | Status: AC
  Administered 2014-04-05: 5 mg via RESPIRATORY_TRACT
  Filled 2014-04-05: qty 6

## 2014-04-05 MED ORDER — PREDNISONE 10 MG PO TABS: 40.0000 mg | ORAL_TABLET | Freq: Every day | ORAL | Status: DC

## 2014-04-05 MED ORDER — ALBUTEROL SULFATE HFA 108 (90 BASE) MCG/ACT IN AERS
2.0000 | INHALATION_SPRAY | RESPIRATORY_TRACT | Status: DC | PRN
  Administered 2014-04-05: 2 via RESPIRATORY_TRACT
  Filled 2014-04-05: qty 6.7

## 2014-04-05 MED ORDER — PREDNISONE 20 MG PO TABS
60.0000 mg | ORAL_TABLET | Freq: Once | ORAL | Status: AC
  Administered 2014-04-05: 60 mg via ORAL
  Filled 2014-04-05: qty 3

## 2014-04-05 MED ORDER — IPRATROPIUM BROMIDE 0.02 % IN SOLN
0.5000 mg | Freq: Once | RESPIRATORY_TRACT | Status: AC
  Administered 2014-04-05: 0.5 mg via RESPIRATORY_TRACT
  Filled 2014-04-05: qty 2.5

## 2014-04-05 NOTE — ED Provider Notes (Signed)
Pt seen and evaluated.  Lungs clear after neb.  C/o Dyspnea over several days, now resolved.  Care D/W PA T. Neva SeatGreene. I agree with her assessment and plan.  Rolland PorterMark Charisma Charlot, MD 04/15/14 [phone]

## 2014-04-05 NOTE — ED Provider Notes (Signed)
Medical screening examination/treatment/procedure(s) were performed by non-physician practitioner and as supervising physician I was immediately available for consultation/collaboration.   EKG Interpretation   Date/Time:  Friday April 05 2014 11:13:39 EDT Ventricular Rate:  105 PR Interval:    QRS Duration: 107 QT Interval:  348 QTC Calculation: 460 R Axis:   -8 Text Interpretation:  Atrial fibrillation LVH with secondary  repolarization abnormality Confirmed by Fayrene FearingJAMES  MD, Yavonne Kiss ([id number]) on  04/05/2014 11:27:09 AM        Rolland PorterMark Melania Kirks, MD 04/05/14 1539

## 2014-04-05 NOTE — Discharge Instructions (Signed)
How to Use an Inhaler Proper inhaler technique is very important. Good technique ensures that the medicine reaches the lungs. Poor technique results in depositing the medicine on the tongue and back of the throat rather than in the airways. If you do not use the inhaler with good technique, the medicine will not help you. STEPS TO FOLLOW IF USING AN INHALER WITHOUT AN EXTENSION TUBE 1. Remove the cap from the inhaler. 2. If you are using the inhaler for the first time, you will need to prime it. Shake the inhaler for 5 seconds and release four puffs into the air, away from your face. Ask your health care provider or pharmacist if you have questions about priming your inhaler. 3. Shake the inhaler for 5 seconds before each breath in (inhalation). 4. Position the inhaler so that the top of the canister faces up. 5. Put your index finger on the top of the medicine canister. Your thumb supports the bottom of the inhaler. 6. Open your mouth. 7. Either place the inhaler between your teeth and place your lips tightly around the mouthpiece, or hold the inhaler 1-2 inches away from your open mouth. If you are unsure of which technique to use, ask your health care provider. 8. Breathe out (exhale) normally and as completely as possible. 9. Press the canister down with your index finger to release the medicine. 10. At the same time as the canister is pressed, inhale deeply and slowly until your lungs are completely filled. This should take 4-6 seconds. Keep your tongue down. 11. Hold the medicine in your lungs for 5-10 seconds (10 seconds is best). This helps the medicine get into the small airways of your lungs. 12. Breathe out slowly, through pursed lips. Whistling is an example of pursed lips. 13. Wait at least 15-30 seconds between puffs. Continue with the above steps until you have taken the number of puffs your health care provider has ordered. Do not use the inhaler more than your health care provider  tells you. 14. Replace the cap on the inhaler. 15. Follow the directions from your health care provider or the inhaler insert for cleaning the inhaler. STEPS TO FOLLOW IF USING AN INHALER WITH AN EXTENSION (SPACER) 1. Remove the cap from the inhaler. 2. If you are using the inhaler for the first time, you will need to prime it. Shake the inhaler for 5 seconds and release four puffs into the air, away from your face. Ask your health care provider or pharmacist if you have questions about priming your inhaler. 3. Shake the inhaler for 5 seconds before each breath in (inhalation). 4. Place the open end of the spacer onto the mouthpiece of the inhaler. 5. Position the inhaler so that the top of the canister faces up and the spacer mouthpiece faces you. 6. Put your index finger on the top of the medicine canister. Your thumb supports the bottom of the inhaler and the spacer. 7. Breathe out (exhale) normally and as completely as possible. 8. Immediately after exhaling, place the spacer between your teeth and into your mouth. Close your lips tightly around the spacer. 9. Press the canister down with your index finger to release the medicine. 10. At the same time as the canister is pressed, inhale deeply and slowly until your lungs are completely filled. This should take 4-6 seconds. Keep your tongue down and out of the way. 11. Hold the medicine in your lungs for 5-10 seconds (10 seconds is best). This helps the   medicine get into the small airways of your lungs. Exhale. 12. Repeat inhaling deeply through the spacer mouthpiece. Again hold that breath for up to 10 seconds (10 seconds is best). Exhale slowly. If it is difficult to take this second deep breath through the spacer, breathe normally several times through the spacer. Remove the spacer from your mouth. 13. Wait at least 15-30 seconds between puffs. Continue with the above steps until you have taken the number of puffs your health care provider has  ordered. Do not use the inhaler more than your health care provider tells you. 14. Remove the spacer from the inhaler, and place the cap on the inhaler. 15. Follow the directions from your health care provider or the inhaler insert for cleaning the inhaler and spacer. If you are using different kinds of inhalers, use your quick relief medicine to open the airways 10-15 minutes before using a steroid if instructed to do so by your health care provider. If you are unsure which inhalers to use and the order of using them, ask your health care provider, nurse, or respiratory therapist. If you are using a steroid inhaler, always rinse your mouth with water after your last puff, then gargle and spit out the water. Do not swallow the water. AVOID:  Inhaling before or after starting the spray of medicine. It takes practice to coordinate your breathing with triggering the spray.  Inhaling through the nose (rather than the mouth) when triggering the spray. HOW TO DETERMINE IF YOUR INHALER IS FULL OR NEARLY EMPTY You cannot know when an inhaler is empty by shaking it. A few inhalers are now being made with dose counters. Ask your health care provider for a prescription that has a dose counter if you feel you need that extra help. If your inhaler does not have a counter, ask your health care provider to help you determine the date you need to refill your inhaler. Write the refill date on a calendar or your inhaler canister. Refill your inhaler 7-10 days before it runs out. Be sure to keep an adequate supply of medicine. This includes making sure it is not expired, and that you have a spare inhaler.  SEEK MEDICAL CARE IF:   Your symptoms are only partially relieved with your inhaler.  You are having trouble using your inhaler.  You have some increase in phlegm. SEEK IMMEDIATE MEDICAL CARE IF:   You feel little or no relief with your inhalers. You are still wheezing and are feeling shortness of breath or  tightness in your chest or both.  You have dizziness, headaches, or a fast heart rate.  You have chills, fever, or night sweats.  You have a noticeable increase in phlegm production, or there is blood in the phlegm. MAKE SURE YOU:   Understand these instructions.  Will watch your condition.  Will get help right away if you are not doing well or get worse. Document Released: 08/13/2000 Document Revised: 06/06/2013 Document Reviewed: 03/15/2013 Exit[employer] Patient Information 2015 [employer]. This information is not intended to replace advice given to you by your health care provider. Make sure you discuss any questions you have with your health care provider.  

## 2014-04-05 NOTE — ED Provider Notes (Signed)
CSN: [id number]     Arrival date & time 04/05/14  1042 History   First MD Initiated Contact with Patient 04/05/14 1056     Chief Complaint  Patient presents with  . Shortness of Breath     (Consider location/radiation/quality/duration/timing/severity/associated sxs/prior Treatment) HPI  PCP: DR. Hildred Priest  Patient to the ER with the complaints of shortness of breath and fatigue for the past 1 month that his progressively worsening. The past 5 days he says have been severe with sputum production, and extertional SOB. He says he was told a week or so ago that he has atrial fibrillation but what given medicine for it. He has never been symptomatic from this. He is having bilateral lower rib pain and orthopnea. Vital signs are stable and he is in NAD.  Past Medical History  Diagnosis Date  . Atrial fibrillation    Past Surgical History  Procedure Laterality Date  . Hernia repair    . Cholecystectomy    . Abdominal aortic aneurysm repair     No family history on file. History  Substance Use Topics  . Smoking status: Former Smoker -- 1.00 packs/day for 20 years    Types: Cigarettes  . Smokeless tobacco: Not on file  . Alcohol Use: No    Review of Systems   Review of Systems  Gen: no weight loss, fevers, chills, night sweats  Eyes: no discharge or drainage, no occular pain or visual changes  Nose: no epistaxis or rhinorrhea  Mouth: no dental pain, no sore throat  Neck: no neck pain  Lungs: No hemoptysis + coughing, wheezing and orthopnea CV:  No palpitations, dependent edema or orthopnea. No chest pain Abd: no diarrhea. No nausea or vomiting, No abdominal pain  GU: no dysuria or gross hematuria  MSK:  No muscle weakness, No  pain Neuro: no headache, no focal neurologic deficits  Skin: no rash , no wounds Psyche: no complaints    Allergies  Review of patient's allergies indicates no known allergies.  Home Medications   Prior to Admission medications   Medication  Sig Start Date End Date Taking? Authorizing Provider  apixaban (ELIQUIS) 5 MG TABS tablet Take 5 mg by mouth 2 (two) times daily.   Yes Historical Provider, MD  buPROPion (WELLBUTRIN XL) 300 MG 24 hr tablet Take 300 mg by mouth daily.   Yes Historical Provider, MD  busPIRone (BUSPAR) 15 MG tablet Take 15 mg by mouth 2 (two) times daily.   Yes Historical Provider, MD  cholecalciferol (VITAMIN D) 1000 UNITS tablet Take 2,000 Units by mouth daily.   Yes Historical Provider, MD  clonazePAM (KLONOPIN) 0.5 MG tablet Take 0.5 mg by mouth at bedtime as needed for anxiety.   Yes Historical Provider, MD  diltiazem (CARDIZEM CD) 180 MG 24 hr capsule Take 180 mg by mouth daily.   Yes Historical Provider, MD  diphenhydrAMINE (BENADRYL) 25 mg capsule Take 25 mg by mouth every 6 (six) hours as needed for sleep.   Yes Historical Provider, MD  dutasteride (AVODART) 0.5 MG capsule Take 0.5 mg by mouth daily.   Yes Historical Provider, MD  escitalopram (LEXAPRO) 20 MG tablet Take 20 mg by mouth daily.   Yes Historical Provider, MD  Eszopiclone (ESZOPICLONE) 3 MG TABS Take 6 mg by mouth at bedtime. Take immediately before bedtime   Yes Historical Provider, MD  folic acid (FOLVITE) 1 MG tablet Take 1 mg by mouth daily.   Yes Historical Provider, MD  levothyroxine (SYNTHROID, LEVOTHROID)  150 MCG tablet Take 150 mcg by mouth daily before breakfast.   Yes Historical Provider, MD  lisinopril (PRINIVIL,ZESTRIL) 40 MG tablet Take 40 mg by mouth daily.   Yes Historical Provider, MD  omega-3 acid ethyl esters (LOVAZA) 1 G capsule Take 2 g by mouth 2 (two) times daily.   Yes Historical Provider, MD  simvastatin (ZOCOR) 20 MG tablet Take 20 mg by mouth daily at 6 PM.   Yes Historical Provider, MD  predniSONE (DELTASONE) 10 MG tablet Take 4 tablets (40 mg total) by mouth daily. 04/05/14   Geovani Tootle Irine Seal, PA-C   BP 132/78  Pulse 106  Temp(Src) 97.6 F (36.4 C) (Oral)  Resp 17  SpO2 99% Physical Exam  Nursing note and vitals  reviewed. Constitutional: He appears well-developed and well-nourished. No distress.  HENT:  Head: Normocephalic and atraumatic.  Eyes: Pupils are equal, round, and reactive to light.  Neck: Normal range of motion. Neck supple.  Cardiovascular: Normal rate and regular rhythm.   Pulmonary/Chest: Effort normal. He has decreased breath sounds. He has wheezes. He has rhonchi (left lower lung base).  Pt has mildly increased effort of breathing  Abdominal: Soft.  Neurological: He is alert.  Skin: Skin is warm and dry.    ED Course  Procedures (including critical care time) Labs Review Labs Reviewed  PRO B NATRIURETIC PEPTIDE - Abnormal; Notable for the following:    Pro B Natriuretic peptide (BNP) 4566.0 (*)    All other components within normal limits  BASIC METABOLIC PANEL - Abnormal; Notable for the following:    Glucose, Bld 106 (*)    Creatinine, Ser 1.55 (*)    GFR calc non Af Amer 41 (*)    GFR calc Af Amer 47 (*)    All other components within normal limits  CBC WITH DIFFERENTIAL    Imaging Review Dg Chest 2 View  04/05/2014   CLINICAL DATA:  Cough, congestion and shortness of breath.  EXAM: CHEST - 2 VIEW  COMPARISON:  09/15/2009  FINDINGS: Lungs show scattered areas of parenchymal scarring and atelectasis, right greater than left. There is elevation of the right hemidiaphragm. There is no evidence of pulmonary edema, consolidation, pneumothorax, nodule or pleural fluid.  The heart size is at the upper limits of normal. The aorta shows mild tortuosity. The bony thorax shows stable mild degenerative changes of the thoracic spine.  IMPRESSION: No active disease. Scattered parenchymal scarring and atelectasis bilaterally, right greater than left.   Electronically Signed   By: Irish Lack M.D.   On: 04/05/2014 11:48     EKG Interpretation   Date/Time:  Friday April 05 2014 11:13:39 EDT Ventricular Rate:  105 PR Interval:    QRS Duration: 107 QT Interval:  348 QTC  Calculation: 460 R Axis:   -8 Text Interpretation:  Atrial fibrillation LVH with secondary  repolarization abnormality Confirmed by Fayrene Fearing  MD, MARK ([id number]) on  04/05/2014 11:27:09 AM      MDM   Final diagnoses:  Wheezing    Medications  albuterol (PROVENTIL HFA;VENTOLIN HFA) 108 (90 BASE) MCG/ACT inhaler 2 puff (not administered)  albuterol (PROVENTIL) (2.5 MG/3ML) 0.083% nebulizer solution 5 mg (5 mg Nebulization Given 04/05/14 1116)  ipratropium (ATROVENT) nebulizer solution 0.5 mg (0.5 mg Nebulization Given 04/05/14 1116)  predniSONE (DELTASONE) tablet 60 mg (60 mg Oral Given 04/05/14 1116)  albuterol (PROVENTIL) (2.5 MG/3ML) 0.083% nebulizer solution 5 mg (5 mg Nebulization Given 04/05/14 1339)  ipratropium (ATROVENT) nebulizer solution 0.5 mg (0.5  mg Nebulization Given 04/05/14 1339)    14:13:23 ED Notes  Pt was at 91%-92% while ambulating in hallway.   Patient seen by Dr. Fayrene FearingJames, M. Patients exam is consistent with COPD exacerbation, his labs and xray are reassuring. No previous BNP to compare but chest xray and PE are not consistent with CHF. Patient feels like he is able to ambulate much easier and is comfortable going home. Dr. Fayrene FearingJames agrees with my physical exam findings, work -up and plan.  80 y.o.[name] [name]'s evaluation in the Emergency Department is complete. It has been determined that no acute conditions requiring further emergency intervention are present at this time. The patient/guardian have been advised of the diagnosis and plan. We have discussed signs and symptoms that warrant return to the ED, such as changes or worsening in symptoms.  Vital signs are stable at discharge. Filed Vitals:   04/05/14 1439  BP: 132/78  Pulse: 106  Temp:   Resp: 17    Patient/guardian has voiced understanding and agreed to follow-up with the PCP or specialist.   Dorthula Matasiffany G Delancey Moraes, PA-C 04/05/14 1447

## 2014-04-05 NOTE — ED Notes (Signed)
Pt reports productive cough white sputum x1 month, increased SOB x5 days, for last 3 days weakness has just been laying on the couch. Reports lower bil rib pain 3/10.

## 2014-04-05 NOTE — ED Notes (Signed)
Pt was at 91%-92% while ambulating in hallway.

## 2014-04-09 DIAGNOSIS — J449 Chronic obstructive pulmonary disease, unspecified: Secondary | ICD-10-CM | POA: Diagnosis not present

## 2014-04-09 DIAGNOSIS — R4789 Other speech disturbances: Secondary | ICD-10-CM | POA: Diagnosis not present

## 2014-04-09 DIAGNOSIS — J4489 Other specified chronic obstructive pulmonary disease: Secondary | ICD-10-CM | POA: Diagnosis not present

## 2014-04-09 DIAGNOSIS — I714 Abdominal aortic aneurysm, without rupture, unspecified: Secondary | ICD-10-CM | POA: Diagnosis not present

## 2014-04-24 DIAGNOSIS — E785 Hyperlipidemia, unspecified: Secondary | ICD-10-CM | POA: Diagnosis not present

## 2014-04-24 DIAGNOSIS — I10 Essential (primary) hypertension: Secondary | ICD-10-CM | POA: Diagnosis not present

## 2014-04-24 DIAGNOSIS — N183 Chronic kidney disease, stage 3 unspecified: Secondary | ICD-10-CM | POA: Diagnosis not present

## 2014-04-29 ENCOUNTER — Encounter: Payer: Self-pay | Admitting: Pulmonary Disease

## 2014-04-29 ENCOUNTER — Ambulatory Visit (INDEPENDENT_AMBULATORY_CARE_PROVIDER_SITE_OTHER): Payer: Medicare Other | Admitting: Pulmonary Disease

## 2014-04-29 VITALS — BP 122/64 | HR 99 | Ht 72.0 in | Wt 255.0 lb

## 2014-04-29 DIAGNOSIS — R059 Cough, unspecified: Secondary | ICD-10-CM

## 2014-04-29 DIAGNOSIS — R0602 Shortness of breath: Secondary | ICD-10-CM | POA: Diagnosis not present

## 2014-04-29 DIAGNOSIS — R05 Cough: Secondary | ICD-10-CM | POA: Diagnosis not present

## 2014-04-29 DIAGNOSIS — J441 Chronic obstructive pulmonary disease with (acute) exacerbation: Secondary | ICD-10-CM

## 2014-04-29 DIAGNOSIS — R0609 Other forms of dyspnea: Secondary | ICD-10-CM

## 2014-04-29 NOTE — Progress Notes (Signed)
Subjective:    Patient ID: [name] [name], male    DOB: [birth year], 80 y.o.   MRN: [id number]  HPI  [name] [name] is here to see me so he "can have a scan of his aneurysm".  He has been seeing a heart doctor at [facility] and was sent to back to his PCP for further management.  It sounds lke he is confused as to why he is here as he has recently had an episode trouble breathing.    He tells me that 2 weeks ago he was seen in the emergency department for the sudden onset of shortness of breath with chest congestion, chest tightness, wheezing.  He was discharged after receiving albuterol which apparently helped. His dyspnea improved.  However, he was noted to have an elevated proBNP and apparently he was given a diuretic recently.  He says that the chest congestion has returned a bit in the last few days  He notes that dyspnea has worsened in the last few years.  He states that trying to climb a flight of stairs with groceries is very difficult.  He has experienced a cough recently, but this has not been a long standing complaint.  He has recently had some leg swelling that has improved with the recent addition of a diuretic.  He has afib and follows with a cardiologist at [facility].    Past Medical History  Diagnosis Date  . Atrial fibrillation   . OSA (obstructive sleep apnea)   . Hypertension   . Hypothyroidism   . Depression   . Hypercholesteremia   . Plantar fasciitis   . Seasonal allergies   . Decreased testosterone level   . H/O ventral hernia   . emphysema   . Varicose veins      Family History  Problem Relation Age of Onset  . Heart disease Father   . Rheum arthritis Mother   . Rheum arthritis Father   . Cancer Sister      History   Social History  . Marital Status: Married    Spouse Name: N/A    Number of Children: N/A  . Years of Education: N/A   Occupational History  . Not on file.   Social History Main Topics  . Smoking status: Former Smoker -- 2.00 packs/day  for 22 years    Types: Cigarettes    Quit date: 04/29/1974  . Smokeless tobacco: Never Used  . Alcohol Use: No  . Drug Use: No  . Sexual Activity: Not on file   Other Topics Concern  . Not on file   Social History Narrative  . No narrative on file     No Known Allergies   Outpatient Prescriptions Prior to Visit  Medication Sig Dispense Refill  . apixaban (ELIQUIS) 5 MG TABS tablet Take 5 mg by mouth 2 (two) times daily.      Marland Kitchen buPROPion (WELLBUTRIN XL) 300 MG 24 hr tablet Take 300 mg by mouth daily.      . busPIRone (BUSPAR) 15 MG tablet Take 15 mg by mouth 2 (two) times daily.      . cholecalciferol (VITAMIN D) 1000 UNITS tablet Take 2,000 Units by mouth daily.      . clonazePAM (KLONOPIN) 0.5 MG tablet Take 0.5 mg by mouth at bedtime as needed for anxiety.      Marland Kitchen diltiazem (CARDIZEM CD) 180 MG 24 hr capsule Take 180 mg by mouth daily.      . diphenhydrAMINE (BENADRYL) 25 mg  capsule Take 25 mg by mouth every 6 (six) hours as needed for sleep.      Marland Kitchen dutasteride (AVODART) 0.5 MG capsule Take 0.5 mg by mouth daily.      Marland Kitchen escitalopram (LEXAPRO) 20 MG tablet Take 20 mg by mouth daily.      . Eszopiclone (ESZOPICLONE) 3 MG TABS Take 6 mg by mouth at bedtime. Take immediately before bedtime      . folic acid (FOLVITE) 1 MG tablet Take 1 mg by mouth daily.      Marland Kitchen levothyroxine (SYNTHROID, LEVOTHROID) 150 MCG tablet Take 150 mcg by mouth daily before breakfast.      . lisinopril (PRINIVIL,ZESTRIL) 40 MG tablet Take 40 mg by mouth daily.      Marland Kitchen omega-3 acid ethyl esters (LOVAZA) 1 G capsule Take 2 g by mouth 2 (two) times daily.      . simvastatin (ZOCOR) 20 MG tablet Take 20 mg by mouth daily at 6 PM.      . predniSONE (DELTASONE) 10 MG tablet Take 4 tablets (40 mg total) by mouth daily.  20 tablet  0   No facility-administered medications prior to visit.      Review of Systems  Constitutional: Negative for fever and unexpected weight change.  HENT: Positive for dental problem,  sinus pressure and trouble swallowing. Negative for congestion, ear pain, nosebleeds, postnasal drip, rhinorrhea, sneezing and sore throat.   Eyes: Negative for redness and itching.  Respiratory: Positive for cough and shortness of breath. Negative for chest tightness and wheezing.   Cardiovascular: Negative for palpitations and leg swelling.  Gastrointestinal: Negative for nausea and vomiting.  Genitourinary: Negative for dysuria.  Musculoskeletal: Negative for joint swelling.  Skin: Negative for rash.  Neurological: Negative for headaches.  Hematological: Does not bruise/bleed easily.  Psychiatric/Behavioral: Positive for dysphoric mood. The patient is nervous/anxious.        Objective:   Physical Exam Filed Vitals:   04/29/14 1540  BP: 122/64  Pulse: 99  Height: 6' (1.829 m)  Weight: 255 lb (115.667 kg)  SpO2: 94%   RA  Gen: chronically il appearing, no acute distress HEENT: NCAT, PERRL, EOMi, OP clear, neck supple without masses PULM: CTA B CV: Irreg irreg, no mgr, no JVD AB: BS+, soft, nontender, no hsm Ext: warm, no edema, no clubbing, no cyanosis Derm: no rash or skin breakdown Neuro: A&Ox4, CN II-XII intact, strength 5/5 in all 4 extremities  02/2014 CXR > mild scarring RLL, no infiltrate, no pleural thickening      Assessment & Plan:   Cough Mr. Squier was referred to me for the possibility of COPD because he was recently seen for cough, shortness of breath, and chest congestion. Today on physical exam his lungs were normal, his recent chest x-ray only showed mild scarring in the right lung, his ambulatory oximetry was normal, and his simple spirometry was completely normal. Therefore based on this I do not see evidence of COPD nor is there clear evidence of other underlying lung disease.  I believe that the recent cough and chest congestion are do to a viral illness and will pass. However, if the cough persists then we could give thought to discontinuing his ACE  inhibitor.    Shortness of breath I worry that his poorly-controlled atrial fibrillation is contributing to shortness of breath. Just sitting at rest today in clinic he briefly had a spike in his heart rate to the 120 range. He describes worsening dyspnea over the last  several years. As noted above he does not have evidence of COPD.  Plan: -I have asked him to discuss this with his cardiologist and to consider whether or not his diltiazem either needs to be titrated or he needs an alternative agent for rate control of his atrial fibrillation -Continue Eliquis    Updated Medication List Outpatient Encounter Prescriptions as of 04/29/2014  Medication Sig  . apixaban (ELIQUIS) 5 MG TABS tablet Take 5 mg by mouth 2 (two) times daily.  Marland Kitchen buPROPion (WELLBUTRIN XL) 300 MG 24 hr tablet Take 300 mg by mouth daily.  . busPIRone (BUSPAR) 15 MG tablet Take 15 mg by mouth 2 (two) times daily.  . cholecalciferol (VITAMIN D) 1000 UNITS tablet Take 2,000 Units by mouth daily.  . clonazePAM (KLONOPIN) 0.5 MG tablet Take 0.5 mg by mouth at bedtime as needed for anxiety.  Marland Kitchen diltiazem (CARDIZEM CD) 180 MG 24 hr capsule Take 180 mg by mouth daily.  . diphenhydrAMINE (BENADRYL) 25 mg capsule Take 25 mg by mouth every 6 (six) hours as needed for sleep.  Marland Kitchen dutasteride (AVODART) 0.5 MG capsule Take 0.5 mg by mouth daily.  Marland Kitchen escitalopram (LEXAPRO) 20 MG tablet Take 20 mg by mouth daily.  . Eszopiclone (ESZOPICLONE) 3 MG TABS Take 6 mg by mouth at bedtime. Take immediately before bedtime  . folic acid (FOLVITE) 1 MG tablet Take 1 mg by mouth daily.  Marland Kitchen levothyroxine (SYNTHROID, LEVOTHROID) 150 MCG tablet Take 150 mcg by mouth daily before breakfast.  . lisinopril (PRINIVIL,ZESTRIL) 40 MG tablet Take 40 mg by mouth daily.  Marland Kitchen omega-3 acid ethyl esters (LOVAZA) 1 G capsule Take 2 g by mouth 2 (two) times daily.  . simvastatin (ZOCOR) 20 MG tablet Take 20 mg by mouth daily at 6 PM.  . [DISCONTINUED] predniSONE  (DELTASONE) 10 MG tablet Take 4 tablets (40 mg total) by mouth daily.

## 2014-04-29 NOTE — Patient Instructions (Signed)
You do not have COPD Take mucinex on days that you have more mucus production Follow up with your cardiologist regarding your atrial fibrillation We will see you back as needed

## 2014-04-29 NOTE — Assessment & Plan Note (Signed)
I worry that his poorly-controlled atrial fibrillation is contributing to shortness of breath. Just sitting at rest today in clinic he briefly had a spike in his heart rate to the 120 range. He describes worsening dyspnea over the last several years. As noted above he does not have evidence of COPD.  Plan: -I have asked him to discuss this with his cardiologist and to consider whether or not his diltiazem either needs to be titrated or he needs an alternative agent for rate control of his atrial fibrillation -Continue Eliquis

## 2014-04-29 NOTE — Assessment & Plan Note (Signed)
Mr. Langenderfer was referred to me for the possibility of COPD because he was recently seen for cough, shortness of breath, and chest congestion. Today on physical exam his lungs were normal, his recent chest x-ray only showed mild scarring in the right lung, his ambulatory oximetry was normal, and his simple spirometry was completely normal. Therefore based on this I do not see evidence of COPD nor is there clear evidence of other underlying lung disease.  I believe that the recent cough and chest congestion are do to a viral illness and will pass. However, if the cough persists then we could give thought to discontinuing his ACE inhibitor.

## 2014-05-02 ENCOUNTER — Other Ambulatory Visit (HOSPITAL_COMMUNITY): Payer: Self-pay | Admitting: Family Medicine

## 2014-05-02 ENCOUNTER — Ambulatory Visit (HOSPITAL_COMMUNITY)
Admission: RE | Admit: 2014-05-02 | Discharge: 2014-05-02 | Disposition: A | Discharge status: Home / Self Care | Payer: Medicare Other | Source: Ambulatory Visit | Attending: Vascular Surgery | Admitting: Vascular Surgery

## 2014-05-02 DIAGNOSIS — Z48812 Encounter for surgical aftercare following surgery on the circulatory system: Secondary | ICD-10-CM | POA: Insufficient documentation

## 2014-05-02 DIAGNOSIS — I714 Abdominal aortic aneurysm, without rupture, unspecified: Secondary | ICD-10-CM

## 2014-05-03 DIAGNOSIS — N401 Enlarged prostate with lower urinary tract symptoms: Secondary | ICD-10-CM | POA: Diagnosis not present

## 2014-05-03 DIAGNOSIS — N138 Other obstructive and reflux uropathy: Secondary | ICD-10-CM | POA: Diagnosis not present

## 2014-06-04 DIAGNOSIS — F321 Major depressive disorder, single episode, moderate: Secondary | ICD-10-CM | POA: Diagnosis not present

## 2014-06-20 ENCOUNTER — Inpatient Hospital Stay (HOSPITAL_COMMUNITY)
Admission: EM | Admit: 2014-06-20 | Discharge: 2014-06-22 | DRG: 310 | Disposition: A | Discharge status: Home Health | Payer: Medicare Other | Attending: Internal Medicine | Admitting: Internal Medicine

## 2014-06-20 ENCOUNTER — Emergency Department (HOSPITAL_COMMUNITY): Payer: Medicare Other

## 2014-06-20 ENCOUNTER — Encounter (HOSPITAL_COMMUNITY): Payer: Self-pay | Admitting: Emergency Medicine

## 2014-06-20 DIAGNOSIS — M722 Plantar fascial fibromatosis: Secondary | ICD-10-CM | POA: Diagnosis present

## 2014-06-20 DIAGNOSIS — R0600 Dyspnea, unspecified: Secondary | ICD-10-CM | POA: Diagnosis present

## 2014-06-20 DIAGNOSIS — Z9181 History of falling: Secondary | ICD-10-CM

## 2014-06-20 DIAGNOSIS — S299XXA Unspecified injury of thorax, initial encounter: Secondary | ICD-10-CM | POA: Diagnosis not present

## 2014-06-20 DIAGNOSIS — I10 Essential (primary) hypertension: Secondary | ICD-10-CM | POA: Diagnosis present

## 2014-06-20 DIAGNOSIS — R0609 Other forms of dyspnea: Secondary | ICD-10-CM | POA: Diagnosis present

## 2014-06-20 DIAGNOSIS — Z9049 Acquired absence of other specified parts of digestive tract: Secondary | ICD-10-CM | POA: Diagnosis present

## 2014-06-20 DIAGNOSIS — E780 Pure hypercholesterolemia: Secondary | ICD-10-CM | POA: Diagnosis present

## 2014-06-20 DIAGNOSIS — S24109A Unspecified injury at unspecified level of thoracic spinal cord, initial encounter: Secondary | ICD-10-CM | POA: Diagnosis not present

## 2014-06-20 DIAGNOSIS — F329 Major depressive disorder, single episode, unspecified: Secondary | ICD-10-CM | POA: Diagnosis present

## 2014-06-20 DIAGNOSIS — I480 Paroxysmal atrial fibrillation: Secondary | ICD-10-CM | POA: Diagnosis present

## 2014-06-20 DIAGNOSIS — R05 Cough: Secondary | ICD-10-CM | POA: Diagnosis present

## 2014-06-20 DIAGNOSIS — R51 Headache: Secondary | ICD-10-CM | POA: Diagnosis not present

## 2014-06-20 DIAGNOSIS — Z79899 Other long term (current) drug therapy: Secondary | ICD-10-CM

## 2014-06-20 DIAGNOSIS — E039 Hypothyroidism, unspecified: Secondary | ICD-10-CM | POA: Diagnosis present

## 2014-06-20 DIAGNOSIS — M549 Dorsalgia, unspecified: Secondary | ICD-10-CM | POA: Diagnosis present

## 2014-06-20 DIAGNOSIS — M25551 Pain in right hip: Secondary | ICD-10-CM | POA: Diagnosis not present

## 2014-06-20 DIAGNOSIS — E785 Hyperlipidemia, unspecified: Secondary | ICD-10-CM | POA: Diagnosis present

## 2014-06-20 DIAGNOSIS — G4733 Obstructive sleep apnea (adult) (pediatric): Secondary | ICD-10-CM | POA: Diagnosis present

## 2014-06-20 DIAGNOSIS — R42 Dizziness and giddiness: Secondary | ICD-10-CM | POA: Diagnosis not present

## 2014-06-20 DIAGNOSIS — Z87891 Personal history of nicotine dependence: Secondary | ICD-10-CM | POA: Diagnosis not present

## 2014-06-20 DIAGNOSIS — J302 Other seasonal allergic rhinitis: Secondary | ICD-10-CM | POA: Diagnosis present

## 2014-06-20 DIAGNOSIS — R059 Cough, unspecified: Secondary | ICD-10-CM

## 2014-06-20 DIAGNOSIS — I517 Cardiomegaly: Secondary | ICD-10-CM | POA: Diagnosis not present

## 2014-06-20 DIAGNOSIS — R079 Chest pain, unspecified: Secondary | ICD-10-CM | POA: Diagnosis not present

## 2014-06-20 DIAGNOSIS — M545 Low back pain: Secondary | ICD-10-CM | POA: Diagnosis not present

## 2014-06-20 DIAGNOSIS — R0602 Shortness of breath: Secondary | ICD-10-CM | POA: Diagnosis not present

## 2014-06-20 DIAGNOSIS — R069 Unspecified abnormalities of breathing: Secondary | ICD-10-CM | POA: Diagnosis not present

## 2014-06-20 DIAGNOSIS — R000 Tachycardia, unspecified: Secondary | ICD-10-CM | POA: Diagnosis not present

## 2014-06-20 DIAGNOSIS — M546 Pain in thoracic spine: Secondary | ICD-10-CM | POA: Diagnosis not present

## 2014-06-20 DIAGNOSIS — I4891 Unspecified atrial fibrillation: Secondary | ICD-10-CM

## 2014-06-20 DIAGNOSIS — I8390 Asymptomatic varicose veins of unspecified lower extremity: Secondary | ICD-10-CM | POA: Diagnosis present

## 2014-06-20 DIAGNOSIS — Z7901 Long term (current) use of anticoagulants: Secondary | ICD-10-CM

## 2014-06-20 DIAGNOSIS — S3982XA Other specified injuries of lower back, initial encounter: Secondary | ICD-10-CM | POA: Diagnosis not present

## 2014-06-20 DIAGNOSIS — S0990XA Unspecified injury of head, initial encounter: Secondary | ICD-10-CM | POA: Diagnosis not present

## 2014-06-20 LAB — CBC WITH DIFFERENTIAL/PLATELET
Basophils Absolute: 0 10*3/uL (ref 0.0–0.1)
Basophils Relative: 0 % (ref 0–1)
EOS ABS: 0.1 10*3/uL (ref 0.0–0.7)
EOS PCT: 1 % (ref 0–5)
HEMATOCRIT: 41.3 % (ref 39.0–52.0)
Hemoglobin: 13.8 g/dL (ref 13.0–17.0)
LYMPHS PCT: 16 % (ref 12–46)
Lymphs Abs: 1.5 10*3/uL (ref 0.7–4.0)
MCH: 30.7 pg (ref 26.0–34.0)
MCHC: 33.4 g/dL (ref 30.0–36.0)
MCV: 92 fL (ref 78.0–100.0)
MONO ABS: 0.7 10*3/uL (ref 0.1–1.0)
Monocytes Relative: 7 % (ref 3–12)
Neutro Abs: 7.4 10*3/uL (ref 1.7–7.7)
Neutrophils Relative %: 76 % (ref 43–77)
PLATELETS: 196 10*3/uL (ref 150–400)
RBC: 4.49 MIL/uL (ref 4.22–5.81)
RDW: 14.9 % (ref 11.5–15.5)
WBC: 9.7 10*3/uL (ref 4.0–10.5)

## 2014-06-20 LAB — TROPONIN I: Troponin I: <0.3 ng/mL (ref <0.30)

## 2014-06-20 LAB — URINALYSIS, ROUTINE W REFLEX MICROSCOPIC
BILIRUBIN URINE: NEGATIVE
GLUCOSE, UA: NEGATIVE mg/dL
Ketones, ur: NEGATIVE mg/dL
Leukocytes, UA: NEGATIVE
Nitrite: NEGATIVE
Protein, ur: NEGATIVE mg/dL
SPECIFIC GRAVITY, URINE: 1.023 (ref 1.005–1.030)
Urobilinogen, UA: 1 mg/dL (ref 0.0–1.0)
pH: 6 (ref 5.0–8.0)

## 2014-06-20 LAB — URINE MICROSCOPIC-ADD ON

## 2014-06-20 LAB — COMPREHENSIVE METABOLIC PANEL
ALBUMIN: 3.4 g/dL — AB (ref 3.5–5.2)
ALT: 18 U/L (ref 0–53)
AST: 21 U/L (ref 0–37)
Alkaline Phosphatase: 91 U/L (ref 39–117)
Anion gap: 14 (ref 5–15)
BUN: 21 mg/dL (ref 6–23)
CHLORIDE: 101 meq/L (ref 96–112)
CO2: 22 meq/L (ref 19–32)
CREATININE: 1.72 mg/dL — AB (ref 0.50–1.35)
Calcium: 9.2 mg/dL (ref 8.4–10.5)
GFR calc Af Amer: 41 mL/min — ABNORMAL LOW (ref >90)
GFR, EST NON AFRICAN AMERICAN: 36 mL/min — AB (ref >90)
Glucose, Bld: 141 mg/dL — ABNORMAL HIGH (ref 70–99)
Potassium: 4.5 mEq/L (ref 3.7–5.3)
SODIUM: 137 meq/L (ref 137–147)
Total Bilirubin: 0.5 mg/dL (ref 0.3–1.2)
Total Protein: 7.4 g/dL (ref 6.0–8.3)

## 2014-06-20 LAB — PRO B NATRIURETIC PEPTIDE: Pro B Natriuretic peptide (BNP): 930.1 pg/mL — ABNORMAL HIGH (ref 0–450)

## 2014-06-20 LAB — TSH: TSH: 6.59 u[IU]/mL — ABNORMAL HIGH (ref 0.350–4.500)

## 2014-06-20 LAB — PROTIME-INR
INR: 1.17 (ref 0.00–1.49)
Prothrombin Time: 15 seconds (ref 11.6–15.2)

## 2014-06-20 MED ORDER — DILTIAZEM HCL 100 MG IV SOLR
5.0000 mg/h | INTRAVENOUS | Status: DC
  Administered 2014-06-20: 5 mg/h via INTRAVENOUS
  Administered 2014-06-20: 10 mg/h via INTRAVENOUS
  Administered 2014-06-21: 5 mg/h via INTRAVENOUS
  Filled 2014-06-20 (×2): qty 100

## 2014-06-20 MED ORDER — APIXABAN 5 MG PO TABS
5.0000 mg | ORAL_TABLET | Freq: Two times a day (BID) | ORAL | Status: DC
  Administered 2014-06-20: 5 mg via ORAL
  Filled 2014-06-20 (×3): qty 1

## 2014-06-20 MED ORDER — BUSPIRONE HCL 15 MG PO TABS
15.0000 mg | ORAL_TABLET | Freq: Two times a day (BID) | ORAL | Status: DC
  Administered 2014-06-21 – 2014-06-22 (×3): 15 mg via ORAL
  Filled 2014-06-20 (×4): qty 1

## 2014-06-20 MED ORDER — BUPROPION HCL ER (XL) 300 MG PO TB24
300.0000 mg | ORAL_TABLET | Freq: Every day | ORAL | Status: DC
  Administered 2014-06-21 – 2014-06-22 (×2): 300 mg via ORAL
  Filled 2014-06-20 (×2): qty 1

## 2014-06-20 MED ORDER — DUTASTERIDE 0.5 MG PO CAPS
0.5000 mg | ORAL_CAPSULE | Freq: Every day | ORAL | Status: DC
  Administered 2014-06-20 – 2014-06-22 (×3): 0.5 mg via ORAL
  Filled 2014-06-20 (×3): qty 1

## 2014-06-20 MED ORDER — CLONAZEPAM 0.5 MG PO TABS: 0.5000 mg | ORAL_TABLET | Freq: Every evening | ORAL | Status: DC | PRN

## 2014-06-20 MED ORDER — SODIUM CHLORIDE 0.9 % IJ SOLN
3.0000 mL | Freq: Two times a day (BID) | INTRAMUSCULAR | Status: DC
  Administered 2014-06-21 – 2014-06-22 (×3): 3 mL via INTRAVENOUS

## 2014-06-20 MED ORDER — FOLIC ACID 1 MG PO TABS
1.0000 mg | ORAL_TABLET | Freq: Every day | ORAL | Status: DC
  Administered 2014-06-20 – 2014-06-22 (×3): 1 mg via ORAL
  Filled 2014-06-20 (×3): qty 1

## 2014-06-20 MED ORDER — CETYLPYRIDINIUM CHLORIDE 0.05 % MT LIQD
7.0000 mL | Freq: Two times a day (BID) | OROMUCOSAL | Status: DC
  Administered 2014-06-21 – 2014-06-22 (×2): 7 mL via OROMUCOSAL

## 2014-06-20 MED ORDER — DILTIAZEM LOAD VIA INFUSION
10.0000 mg | Freq: Once | INTRAVENOUS | Status: AC
  Administered 2014-06-20: 10 mg via INTRAVENOUS
  Filled 2014-06-20: qty 10

## 2014-06-20 MED ORDER — LEVOTHYROXINE SODIUM 150 MCG PO TABS
150.0000 ug | ORAL_TABLET | Freq: Every day | ORAL | Status: DC
  Administered 2014-06-21 – 2014-06-22 (×2): 150 ug via ORAL
  Filled 2014-06-20 (×3): qty 1

## 2014-06-20 MED ORDER — DILTIAZEM HCL 60 MG PO TABS
60.0000 mg | ORAL_TABLET | Freq: Four times a day (QID) | ORAL | Status: DC
  Administered 2014-06-20 – 2014-06-22 (×7): 60 mg via ORAL
  Filled 2014-06-20 (×11): qty 1

## 2014-06-20 MED ORDER — ZOLPIDEM TARTRATE 5 MG PO TABS
5.0000 mg | ORAL_TABLET | Freq: Every evening | ORAL | Status: DC | PRN
  Administered 2014-06-21 (×2): 5 mg via ORAL
  Filled 2014-06-20 (×2): qty 1

## 2014-06-20 MED ORDER — ESCITALOPRAM OXALATE 20 MG PO TABS
20.0000 mg | ORAL_TABLET | Freq: Every day | ORAL | Status: DC
  Administered 2014-06-20 – 2014-06-22 (×3): 20 mg via ORAL
  Filled 2014-06-20 (×3): qty 1

## 2014-06-20 MED ORDER — SIMVASTATIN 20 MG PO TABS
20.0000 mg | ORAL_TABLET | Freq: Every day | ORAL | Status: DC
  Administered 2014-06-20 – 2014-06-21 (×2): 20 mg via ORAL
  Filled 2014-06-20 (×3): qty 1

## 2014-06-20 NOTE — ED Notes (Signed)
Spoke with EDP  No parameters placed EDP stated if HR less than 100 leave rate as is, IF HR above 100 titrate by 5

## 2014-06-20 NOTE — ED Notes (Signed)
Patient was at the doctor's office and found Tachy and SOB. Patient has a history of A-fib and Congestive heart failure. Patient denies Chest pain n/v . Patient has productive cough. EMS placed 18 L AC  Vitals are 146/76 Patient was 90 on RA and 95% on 4L.

## 2014-06-20 NOTE — Progress Notes (Signed)
Called and received report from ED nurse.  Hermina BartersBOWMAN, Rosalia Mcavoy M, RN

## 2014-06-20 NOTE — H&P (Addendum)
History and Physical    [name] [name] JYN:[id number] DOB: May 16, 1932 DOA: 06/20/2014  Referring physician: Dr. Littie Deeds  PCP: Cain Saupe, MD  Specialists: none   Chief Complaint: elevated heart rate  HPI: [name] [name] is a 80 y.o. male has a past medical history significant for A fib, HTN, hypothyroidism, HLD, is being brought from his PCP's office where he was found short of breath and with elevated heart rate. Patient is relatively asymptomatic and he endorses mild SOB and back pain. He has had several falls at home in the past month and has been having more back pain after a most recent fall few days ago. That was the main reason why he saw his PCP today. He denies any chest pain, denies any abdominal pain, nausea or vomiting. He denies any fevers or chills. He has been having a chronic cough for the past few months and mild dyspnea and was evaluated by Pulmonology in August for presumed COPD, however his PFTs did not support that.  In the ED patient's heart rate was initially in the 120-130s, started on a diltiazem gtt with subsequent improvement in his heart rate. His blood pressure has remained stable and TR[facility] asked for admission.   Patient also describing progressive dyspnea on exertion worsening for the past 1-2 months. He is able to drive and carry his groceries from the car to his home (going up a flight of stairs) but is more and more dyspneic.   Review of Systems: as per HPI otherwise negative  Past Medical History  Diagnosis Date  . Atrial fibrillation   . OSA (obstructive sleep apnea)   . Hypertension   . Hypothyroidism   . Depression   . Hypercholesteremia   . Plantar fasciitis   . Seasonal allergies   . Decreased testosterone level   . H/O ventral hernia   . emphysema   . Varicose veins    Past Surgical History  Procedure Laterality Date  . Hernia repair    . Cholecystectomy    . Abdominal aortic aneurysm repair      X2   Social History:   reports that he quit smoking about 40 years ago. His smoking use included Cigarettes. He has a 44 pack-year smoking history. He has never used smokeless tobacco. He reports that he does not drink alcohol or use illicit drugs.  No Known Allergies  Family History  Problem Relation Age of Onset  . Heart disease Father   . Rheum arthritis Mother   . Rheum arthritis Father   . Cancer Sister     Prior to Admission medications   Medication Sig Start Date End Date Taking? Authorizing Provider  apixaban (ELIQUIS) 5 MG TABS tablet Take 5 mg by mouth 2 (two) times daily.   Yes Historical Provider, MD  buPROPion (WELLBUTRIN XL) 300 MG 24 hr tablet Take 300 mg by mouth daily.   Yes Historical Provider, MD  busPIRone (BUSPAR) 15 MG tablet Take 15 mg by mouth 2 (two) times daily.   Yes Historical Provider, MD  cholecalciferol (VITAMIN D) 1000 UNITS tablet Take 2,000 Units by mouth daily.   Yes Historical Provider, MD  clonazePAM (KLONOPIN) 0.5 MG tablet Take 0.5 mg by mouth at bedtime as needed for anxiety.   Yes Historical Provider, MD  diltiazem (CARDIZEM CD) 180 MG 24 hr capsule Take 180 mg by mouth daily.   Yes Historical Provider, MD  diphenhydrAMINE (BENADRYL) 25 mg capsule Take 25 mg by mouth every 6 (six)  hours as needed for sleep.   Yes Historical Provider, MD  dutasteride (AVODART) 0.5 MG capsule Take 0.5 mg by mouth daily.   Yes Historical Provider, MD  escitalopram (LEXAPRO) 20 MG tablet Take 20 mg by mouth daily.   Yes Historical Provider, MD  Eszopiclone (ESZOPICLONE) 3 MG TABS Take 6 mg by mouth at bedtime. Take immediately before bedtime   Yes Historical Provider, MD  folic acid (FOLVITE) 1 MG tablet Take 1 mg by mouth daily.   Yes Historical Provider, MD  levothyroxine (SYNTHROID, LEVOTHROID) 150 MCG tablet Take 150 mcg by mouth daily before breakfast.   Yes Historical Provider, MD  lisinopril (PRINIVIL,ZESTRIL) 40 MG tablet Take 40 mg by mouth daily.   Yes Historical Provider, MD    omega-3 acid ethyl esters (LOVAZA) 1 G capsule Take 2 g by mouth 2 (two) times daily.   Yes Historical Provider, MD  simvastatin (ZOCOR) 20 MG tablet Take 20 mg by mouth daily at 6 PM.   Yes Historical Provider, MD   Physical Exam: Filed Vitals:   06/20/14 1200 06/20/14 1330 06/20/14 1345 06/20/14 1414  BP: 134/74 137/71 153/83 139/81  Pulse: 116 105 103 109  Temp:      TempSrc:      Resp:  32  25  SpO2: 93% 93% 97% 98%     General:  No apparent distress  Eyes: PERRL, EOMI, no scleral icterus  ENT: moist oropharynx  Neck: supple, no JVD  Cardiovascular: irregularly irregular  Respiratory: CTA biL, good air movement without wheezing or crackles  Abdomen: soft, non tender to palpation, positive bowel sounds, no guarding, no rebound  Skin: no rashes  Musculoskeletal: no peripheral edema  Psychiatric: normal mood and affect  Neurologic: non focal  Labs on Admission:  Basic Metabolic Panel:  Recent Labs Lab 06/20/14 1142  NA 137  K 4.5  CL 101  CO2 22  GLUCOSE 141*  BUN 21  CREATININE 1.72*  CALCIUM 9.2   Liver Function Tests:  Recent Labs Lab 06/20/14 1142  AST 21  ALT 18  ALKPHOS 91  BILITOT 0.5  PROT 7.4  ALBUMIN 3.4*   CBC:  Recent Labs Lab 06/20/14 1142  WBC 9.7  NEUTROABS 7.4  HGB 13.8  HCT 41.3  MCV 92.0  PLT 196   Cardiac Enzymes:  Recent Labs Lab 06/20/14 1142  TROPONINI <0.30    BNP (last 3 results)  Recent Labs  04/05/14 1127 06/20/14 1148  PROBNP 4566.0* 930.1*   Radiological Exams on Admission: Dg Chest 2 View  06/20/2014   CLINICAL DATA:  Status post fall 06/16/2014. Mid chest and lower thoracic pain.  EXAM: CHEST  2 VIEW  COMPARISON:  PA and lateral chest 02/03/2014.  FINDINGS: The lungs are clear. Heart size is mildly enlarged. The aorta is tortuous and ectatic. No pneumothorax or pleural effusion. No focal bony abnormality.  IMPRESSION: No acute disease.  Stable compared to prior exam.   Electronically Signed    By: Drusilla Kannerhomas  Dalessio M.D.   On: 06/20/2014 13:03   Dg Thoracic Spine 2 View  06/20/2014   CLINICAL DATA:  Status post fall 06/16/2014. Mid and lower thoracic pain.  EXAM: THORACIC SPINE - 2 VIEW  COMPARISON:  PA and lateral chest 04/05/2014.  FINDINGS: Vertebral body height and alignment are maintained. Mild degenerative disease is noted. Paraspinous structures show no acute abnormality.  IMPRESSION: No acute finding.   Electronically Signed   By: Drusilla Kannerhomas  Dalessio M.D.   On: 06/20/2014 13:04  Dg Lumbar Spine Complete  06/20/2014   CLINICAL DATA:  Acute lower back spine pain after fall.  EXAM: LUMBAR SPINE - COMPLETE 4+ VIEW  COMPARISON:  None.  FINDINGS: There is no evidence of lumbar spine fracture. Alignment is normal. Intervertebral disc spaces are maintained. Minimal spurring is noted anteriorly at L2-3, L3-4 and L4-5.  IMPRESSION: Minimal degenerative changes as described above. No acute abnormality seen in the lumbar spine.   Electronically Signed   By: Roque Lias M.D.   On: 06/20/2014 13:05   Ct Head Wo Contrast  06/20/2014   CLINICAL DATA:  80 year old male with history of trauma from a fall complaining of headache.  EXAM: CT HEAD WITHOUT CONTRAST  TECHNIQUE: Contiguous axial images were obtained from the base of the skull through the vertex without intravenous contrast.  COMPARISON:  No priors.  FINDINGS: Moderate cerebral and mild cerebellar atrophy. Patchy and confluent areas of decreased attenuation are noted throughout the deep and periventricular white matter of the cerebral hemispheres bilaterally, compatible with chronic microvascular ischemic disease. Postoperative changes in the it left frontal bone, deep to which there are several surgical clips. Aneurysm wing clip in the region of the left ICA. No acute displaced skull fracture. No acute intracranial abnormality. Specifically, no definite signs of acute posttraumatic intracranial hemorrhage, no large vessel region of acute/  subacute cerebral ischemia, no mass, mass effect, hydrocephalus or abnormal intra or extra-axial fluid collections. Mastoids are well pneumatized bilaterally. Complete opacification and mucoperiosteal thickening in the right maxillary sinus. Multifocal mucosal thickening in the right ethmoid sinuses. Right turbinates have been resected. Status post right maxillary antrectomy.  IMPRESSION: 1. No signs of significant acute traumatic injury to the skull or brain. 2. Postoperative changes from prior left-sided aneurysm clipping. 3. Moderate cerebral and mild cerebellar atrophy with extensive chronic microvascular ischemic changes in the cerebral white matter, as above. 4. Extensive paranasal sinus disease and postoperative changes, as above.   Electronically Signed   By: Trudie Reed M.D.   On: 06/20/2014 12:51    EKG: Independently reviewed. A fib w/ RVR  Assessment/Plan Active Problems:   Cough   Shortness of breath   Atrial fibrillation with RVR   Hypothyroidism   DOE (dyspnea on exertion)  A fib with RVR - patient with elevated heart rate however relatively asymptomatic - he is on Cardizem 180 mg at home, will start 60 mg every 6 hours (240 total) and wean off his gtt - Per outpatient notes when he saw his pulmonologist 2 months ago it was noted that his heart rate spiked to 120 range and was advised to see his cardiologist to have his medications probably adjusted as he might not have his rate well controlled  Dyspnea on exertion - I called and discussed with patient's cardiologist to [facility], Dr. Malissa Hippo and patient is well known to him, he has AI for some time however not much symptomatic from this, last time he was seen in June. Apparently patient also had a cath in 2014 without major findings. - his DOE might be multifactorial from AI as well as poor rate control in the past 1-2 months - obtain a 2D echo, based on findings may need inpatient cardiology consult - BNP elevated to  900 - cycle troponin  Hypothyroidism - will check a TSH  Chronic cough - ?related to Lisinopril, will hold for now - just evaluated by Pulm - CXR without acute findings, no wheezing on exam, no evidence of COPD or chronic  lung problems.   Recent falls - may be related to his poorly controlled heart rate - he is on anticoagulation and will maintain that for now, I discussed with his cardiologyst - obtain PT evaluation   Diet: heart healthy Fluids: none  DVT Prophylaxis: Apixaban   Code Status: Full  Family Communication: wife and son bedside  Disposition Plan: home when ready   Time spent: 2170  Costin M. Elvera LennoxGherghe, MD [facility] Pager [phone]  If 7PM-7AM, please contact night-coverage [email or web address] Password [email or web address] 06/20/2014, 3:52 PM

## 2014-06-20 NOTE — ED Provider Notes (Signed)
CSN: [id number]     Arrival date & time 06/20/14  1112 History   First MD Initiated Contact with Patient 06/20/14 1113     Chief Complaint  Patient presents with  . Tachycardia  . Shortness of Breath     (Consider location/radiation/quality/duration/timing/severity/associated sxs/prior Treatment) Patient is a 80 y.o. male presenting with shortness of breath.  Shortness of Breath Severity:  Moderate Onset quality:  Gradual Duration:  2 weeks Timing:  Constant Progression:  Worsening Chronicity:  New Context: not URI   Relieved by:  Nothing Worsened by:  Nothing tried Ineffective treatments:  None tried Associated symptoms: no abdominal pain and no chest pain   Associated symptoms comment:  Back pain after fall 3 days ago   Past Medical History  Diagnosis Date  . Atrial fibrillation   . OSA (obstructive sleep apnea)   . Hypertension   . Hypothyroidism   . Depression   . Hypercholesteremia   . Plantar fasciitis   . Seasonal allergies   . Decreased testosterone level   . H/O ventral hernia   . emphysema   . Varicose veins    Past Surgical History  Procedure Laterality Date  . Hernia repair    . Cholecystectomy    . Abdominal aortic aneurysm repair      X2   Family History  Problem Relation Age of Onset  . Heart disease Father   . Rheum arthritis Mother   . Rheum arthritis Father   . Cancer Sister    History  Substance Use Topics  . Smoking status: Former Smoker -- 2.00 packs/day for 22 years    Types: Cigarettes    Quit date: 04/29/1974  . Smokeless tobacco: Never Used  . Alcohol Use: No    Review of Systems  Respiratory: Positive for shortness of breath.   Cardiovascular: Negative for chest pain.  Gastrointestinal: Negative for abdominal pain.  All other systems reviewed and are negative.     Allergies  Review of patient's allergies indicates no known allergies.  Home Medications   Prior to Admission medications   Medication Sig Start Date  End Date Taking? Authorizing Provider  buPROPion (WELLBUTRIN XL) 300 MG 24 hr tablet Take 300 mg by mouth daily.   Yes Historical Provider, MD  busPIRone (BUSPAR) 15 MG tablet Take 15 mg by mouth 2 (two) times daily.   Yes Historical Provider, MD  cholecalciferol (VITAMIN D) 1000 UNITS tablet Take 2,000 Units by mouth daily.   Yes Historical Provider, MD  clonazePAM (KLONOPIN) 0.5 MG tablet Take 0.5 mg by mouth at bedtime as needed for anxiety.   Yes Historical Provider, MD  diphenhydrAMINE (BENADRYL) 25 mg capsule Take 25 mg by mouth every 6 (six) hours as needed for sleep.   Yes Historical Provider, MD  dutasteride (AVODART) 0.5 MG capsule Take 0.5 mg by mouth daily.   Yes Historical Provider, MD  escitalopram (LEXAPRO) 20 MG tablet Take 20 mg by mouth daily.   Yes Historical Provider, MD  Eszopiclone (ESZOPICLONE) 3 MG TABS Take 6 mg by mouth at bedtime. Take immediately before bedtime   Yes Historical Provider, MD  folic acid (FOLVITE) 1 MG tablet Take 1 mg by mouth daily.   Yes Historical Provider, MD  levothyroxine (SYNTHROID, LEVOTHROID) 150 MCG tablet Take 150 mcg by mouth daily before breakfast.   Yes Historical Provider, MD  omega-3 acid ethyl esters (LOVAZA) 1 G capsule Take 2 g by mouth 2 (two) times daily.   Yes Historical Provider, MD  simvastatin (ZOCOR) 20 MG tablet Take 20 mg by mouth daily at 6 PM.   Yes Historical Provider, MD  apixaban (ELIQUIS) 2.5 MG TABS tablet Take 1 tablet (2.5 mg total) by mouth 2 (two) times daily. 06/22/14   Osvaldo ShipperGokul Krishnan, MD  diltiazem (CARDIZEM CD) 240 MG 24 hr capsule Take 1 capsule (240 mg total) by mouth daily. 06/22/14   Osvaldo ShipperGokul Krishnan, MD  fluticasone (FLONASE) 50 MCG/ACT nasal spray Place 1 spray into both nostrils daily. 06/22/14   Osvaldo ShipperGokul Krishnan, MD   BP 103/50  Pulse 103  Temp(Src) 98.3 F (36.8 C) (Oral)  Resp 18  Ht 6' (1.829 m)  Wt 265 lb 6.4 oz (120.385 kg)  BMI 35.99 kg/m2  SpO2 92% Physical Exam  Vitals  reviewed. Constitutional: He is oriented to person, place, and time. He appears well-developed and well-nourished.  HENT:  Head: Normocephalic and atraumatic.  Eyes: Conjunctivae and EOM are normal.  Neck: Normal range of motion. Neck supple.  Cardiovascular: Normal heart sounds.  An irregularly irregular rhythm present. Tachycardia present.   Pulmonary/Chest: Effort normal. No respiratory distress. He has rales in the right lower field and the left lower field.  Abdominal: He exhibits no distension. There is no tenderness. There is no rebound and no guarding.  Musculoskeletal: Normal range of motion.       Thoracic back: He exhibits tenderness and bony tenderness.       Lumbar back: He exhibits tenderness and bony tenderness.  Neurological: He is alert and oriented to person, place, and time.  Skin: Skin is warm and dry.    ED Course  Procedures (including critical care time) Labs Review Labs Reviewed  COMPREHENSIVE METABOLIC PANEL - Abnormal; Notable for the following:    Glucose, Bld 141 (*)    Creatinine, Ser 1.72 (*)    Albumin 3.4 (*)    GFR calc non Af Amer 36 (*)    GFR calc Af Amer 41 (*)    All other components within normal limits  URINALYSIS, ROUTINE W REFLEX MICROSCOPIC - Abnormal; Notable for the following:    Hgb urine dipstick TRACE (*)    All other components within normal limits  PRO B NATRIURETIC PEPTIDE - Abnormal; Notable for the following:    Pro B Natriuretic peptide (BNP) 930.1 (*)    All other components within normal limits  TSH - Abnormal; Notable for the following:    TSH 6.590 (*)    All other components within normal limits  URINE MICROSCOPIC-ADD ON - Abnormal; Notable for the following:    Squamous Epithelial / LPF FEW (*)    All other components within normal limits  BASIC METABOLIC PANEL - Abnormal; Notable for the following:    Sodium 136 (*)    Glucose, Bld 105 (*)    Creatinine, Ser 1.57 (*)    GFR calc non Af Amer 40 (*)    GFR calc Af  Amer 46 (*)    All other components within normal limits  CBC - Abnormal; Notable for the following:    RBC 4.06 (*)    Hemoglobin 12.7 (*)    HCT 37.7 (*)    All other components within normal limits  BASIC METABOLIC PANEL - Abnormal; Notable for the following:    Glucose, Bld 115 (*)    Creatinine, Ser 1.54 (*)    GFR calc non Af Amer 41 (*)    GFR calc Af Amer 47 (*)    All other components within normal  limits  CBC WITH DIFFERENTIAL  PROTIME-INR  TROPONIN I  TROPONIN I  TROPONIN I  TROPONIN I  T4, FREE    Imaging Review No results found.   EKG Interpretation   Date/Time:  Thursday June 20 2014 11:38:45 EDT Ventricular Rate:  108 PR Interval:    QRS Duration: 111 QT Interval:  298 QTC Calculation: 399 R Axis:   -8 Text Interpretation:  Atrial fibrillation Abnormal R-wave progression,  early transition Repol abnrm suggests ischemia, anterolateral Confirmed by  Mirian MoGentry, Matthew [phone]) on 06/20/2014 12:15:28 PM      MDM   Final diagnoses:  Atrial fibrillation with RVR  Cough  DOE (dyspnea on exertion)  Hypothyroidism, unspecified hypothyroidism type    80 y.o. male with pertinent PMH of afib with rvr presents with recurrent symptoms of dyspnea and chest pressure similar to prior afib with rvr.  On arrival vitals and physical exam as above.  Given dilt and dilt drip with improvement in rate and resolution of symptoms.  He also reports a fall last week with hit to head.  Imaging unremarkable for same.  Consulted medicine for admission.    1. Atrial fibrillation with RVR   2. Cough   3. DOE (dyspnea on exertion)   4. Hypothyroidism, unspecified hypothyroidism type         Mirian MoMatthew Gentry, MD 06/23/14 [phone]

## 2014-06-20 NOTE — ED Notes (Signed)
Patient HR is 124 before Cardizem

## 2014-06-21 DIAGNOSIS — R05 Cough: Secondary | ICD-10-CM

## 2014-06-21 DIAGNOSIS — I4891 Unspecified atrial fibrillation: Principal | ICD-10-CM

## 2014-06-21 DIAGNOSIS — I517 Cardiomegaly: Secondary | ICD-10-CM

## 2014-06-21 DIAGNOSIS — R0609 Other forms of dyspnea: Secondary | ICD-10-CM

## 2014-06-21 DIAGNOSIS — E039 Hypothyroidism, unspecified: Secondary | ICD-10-CM

## 2014-06-21 LAB — BASIC METABOLIC PANEL
ANION GAP: 13 (ref 5–15)
BUN: 22 mg/dL (ref 6–23)
CO2: 22 meq/L (ref 19–32)
CREATININE: 1.57 mg/dL — AB (ref 0.50–1.35)
Calcium: 9.2 mg/dL (ref 8.4–10.5)
Chloride: 101 mEq/L (ref 96–112)
GFR calc Af Amer: 46 mL/min — ABNORMAL LOW (ref >90)
GFR calc non Af Amer: 40 mL/min — ABNORMAL LOW (ref >90)
Glucose, Bld: 105 mg/dL — ABNORMAL HIGH (ref 70–99)
Potassium: 4.8 mEq/L (ref 3.7–5.3)
SODIUM: 136 meq/L — AB (ref 137–147)

## 2014-06-21 LAB — TROPONIN I
Troponin I: <0.3 ng/mL (ref <0.30)
Troponin I: <0.3 ng/mL (ref <0.30)

## 2014-06-21 LAB — T4, FREE: FREE T4: 0.94 ng/dL (ref 0.80–1.80)

## 2014-06-21 MED ORDER — APIXABAN 2.5 MG PO TABS
2.5000 mg | ORAL_TABLET | Freq: Two times a day (BID) | ORAL | Status: DC
  Administered 2014-06-21 – 2014-06-22 (×3): 2.5 mg via ORAL
  Filled 2014-06-21 (×4): qty 1

## 2014-06-21 MED ORDER — FLUTICASONE PROPIONATE 50 MCG/ACT NA SUSP
1.0000 | Freq: Every day | NASAL | Status: DC
  Administered 2014-06-21 – 2014-06-22 (×2): 1 via NASAL
  Filled 2014-06-21: qty 16

## 2014-06-21 MED ORDER — TRAZODONE HCL 50 MG PO TABS
50.0000 mg | ORAL_TABLET | Freq: Every evening | ORAL | Status: DC | PRN
  Administered 2014-06-21: 50 mg via ORAL
  Filled 2014-06-21: qty 1

## 2014-06-21 NOTE — Evaluation (Signed)
Physical Therapy Evaluation Patient Details Name: [name] [name] MRN: [id number] DOB: [birth year] [name] Date: 06/21/2014   History of Present Illness  [name] [name] is a 80 y.o. male has a past medical history significant for A fib, HTN, hypothyroidism, HLD, is being brought from his P[name] office where he was found short of breath and with elevated heart rate. He has had several falls at home in the past month.  Clinical Impression  Pt admitted with the above. Pt currently with functional limitations due to the deficits listed below (see PT Problem List). Ambulates majority of distance at min guard level but had 2 bouts of loss of balance requiring mod assist to prevent from falling. Pt is a high fall risk. He is highly motivated to return home but understands he will require further rehabilitation due to his history frequent falls at home, that appear to be increasing in frequency. Pt will benefit from skilled PT to increase their independence and safety with mobility to allow discharge to the venue listed below.       Follow Up Recommendations CIR;Supervision for mobility/OOB    Equipment Recommendations  Rolling walker with 5" wheels    Recommendations for Other Services Rehab consult;OT consult     Precautions / Restrictions Precautions Precautions: Fall Restrictions Weight Bearing Restrictions: No      Mobility  Bed Mobility Overal bed mobility: Needs Assistance Bed Mobility: Rolling;Sidelying to Sit Rolling: Min guard Sidelying to sit: Min guard       General bed mobility comments: Close min guard for safety. VC for technique. Requires considerable effort to achieve seated position.  Transfers Overall transfer level: Needs assistance Equipment used: Rolling walker (2 wheeled) Transfers: Sit to/from Stand Sit to Stand: Mod assist         General transfer comment: Min assist for balance to rise from lowest bed setting. Loss of balance anteriorly  once standing requiring Mod assist to correct.  Ambulation/Gait Ambulation/Gait assistance: Mod assist Ambulation Distance (Feet): 100 Feet Assistive device: Rolling walker (2 wheeled) Gait Pattern/deviations: Step-through pattern;Staggering right;Decreased stride length   Gait velocity interpretation: Below normal speed for age/gender General Gait Details: Educated on safe DME use with a rolling walker. Min guard throughout majority of ambulatory bout however patient had 2 bouts of loss of balance requiring mod assist to prevent falling. Cues to maintain WB through UEs for prevention of tipping walker over. Pt denies any dizziness but states he feels like he is "in a tunnel."  Stairs            Wheelchair Mobility    Modified Rankin (Stroke Patients Only)       Balance Overall balance assessment: Needs assistance;History of Falls Sitting-balance support: No upper extremity supported;Feet supported Sitting balance-Leahy Scale: Fair     Standing balance support: No upper extremity supported Standing balance-Leahy Scale: Fair                               Pertinent Vitals/Pain Pain Assessment: 0-10 Pain Score: 5  Pain Location: Head, right shoulder, right hip (hip hurts the worst) Pain Descriptors / Indicators: Aching;Dull Pain Intervention([name]): Monitored during session;Repositioned    Home Living Family/patient expects to be discharged to:: Private residence Living Arrangements: Spouse/significant other Available Help at Discharge: Family;Available 24 hours/day Type of Home: House Home Access: Stairs to enter Entrance Stairs-Rails: None Entrance Stairs-Number of Steps: 4 Home Layout: Multi-level Home Equipment: Walker - 4 wheels;Cane -  quad      Prior Function Level of Independence: Independent               Hand Dominance   Dominant Hand: Right    Extremity/Trunk Assessment   Upper Extremity Assessment: Defer to OT evaluation            Lower Extremity Assessment: Overall WFL for tasks assessed         Communication   Communication: No difficulties  Cognition Arousal/Alertness: Awake/alert Behavior During Therapy: WFL for tasks assessed/performed Overall Cognitive Status: Within Functional Limits for tasks assessed                      General Comments General comments (skin integrity, edema, etc.): Reports feeling like he is "in a tunnel"    Exercises        Assessment/Plan    PT Assessment Patient needs continued PT services  PT Diagnosis Difficulty walking;Abnormality of gait;Acute pain   PT Problem List Decreased activity tolerance;Decreased balance;Decreased mobility;Decreased knowledge of use of DME;Decreased knowledge of precautions;Pain;Obesity  PT Treatment Interventions DME instruction;Gait training;Stair training;Functional mobility training;Therapeutic activities;Therapeutic exercise;Balance training;Neuromuscular re-education;Patient/family education;Modalities   PT Goals (Current goals can be found in the Care Plan section) Acute Rehab PT Goals Patient Stated Goal: Be able to take care of my wife PT Goal Formulation: With patient Time For Goal Achievement: 06/28/14 Potential to Achieve Goals: Good    Frequency Min 3X/week   Barriers to discharge        Co-evaluation               End of Session Equipment Utilized During Treatment: Gait belt Activity Tolerance: Patient tolerated treatment well Patient left: in chair;with call bell/phone within reach;with family/visitor present Nurse Communication: Mobility status;Precautions         Time: [id number] PT Time Calculation (min): 31 min   Charges:   PT Evaluation $Initial PT Evaluation Tier I: 1 Procedure PT Treatments $Gait Training: 8-22 mins   PT G Codes:         [name] [employer] Happy [name], [place]PT [phone]  [name] [name], [name] [name] 06/21/2014, 10:38 AM

## 2014-06-21 NOTE — Consult Note (Signed)
Physical Medicine and Rehabilitation Consult Reason for Consult: Debilitation Referring Physician: [facility]   HPI: [name] [name] is a 80 y.o. right-handed male with history of atrialf fibrillation Eliquis, hypertension and obstructive sleep apnea. Patient independent living with his wife prior to admission. Admitted 06/20/2014 with increasing shortness of breath and recent falls. Cranial CT scan negative for acute changes. Thoracic lumbar spine films negative for fracture. Echocardiogram is pending. EKG atrial fibrillation with RVR and Troponin negative. Urine study unremarkable. Placed on intravenous Cardizem. Physical therapy evaluation completed 06/21/2014 with recommendations of physical medicine rehabilitation consult.   Review of Systems  Cardiovascular: Positive for palpitations and leg swelling.  Gastrointestinal: Positive for constipation.  Musculoskeletal: Positive for back pain and falls.  Psychiatric/Behavioral: Positive for depression.  All other systems reviewed and are negative.  Past Medical History  Diagnosis Date  . Atrial fibrillation   . OSA (obstructive sleep apnea)   . Hypertension   . Hypothyroidism   . Depression   . Hypercholesteremia   . Plantar fasciitis   . Seasonal allergies   . Decreased testosterone level   . H/O ventral hernia   . emphysema   . Varicose veins    Past Surgical History  Procedure Laterality Date  . Hernia repair    . Cholecystectomy    . Abdominal aortic aneurysm repair      X2   Family History  Problem Relation Age of Onset  . Heart disease Father   . Rheum arthritis Mother   . Rheum arthritis Father   . Cancer Sister    Social History:  reports that he quit smoking about 40 years ago. His smoking use included Cigarettes. He has a 44 pack-year smoking history. He has never used smokeless tobacco. He reports that he does not drink alcohol or use illicit drugs. Allergies: No Known Allergies Medications Prior to  Admission  Medication Sig Dispense Refill  . apixaban (ELIQUIS) 5 MG TABS tablet Take 5 mg by mouth 2 (two) times daily.      Marland Kitchen buPROPion (WELLBUTRIN XL) 300 MG 24 hr tablet Take 300 mg by mouth daily.      . busPIRone (BUSPAR) 15 MG tablet Take 15 mg by mouth 2 (two) times daily.      . cholecalciferol (VITAMIN D) 1000 UNITS tablet Take 2,000 Units by mouth daily.      . clonazePAM (KLONOPIN) 0.5 MG tablet Take 0.5 mg by mouth at bedtime as needed for anxiety.      Marland Kitchen diltiazem (CARDIZEM CD) 180 MG 24 hr capsule Take 180 mg by mouth daily.      . diphenhydrAMINE (BENADRYL) 25 mg capsule Take 25 mg by mouth every 6 (six) hours as needed for sleep.      Marland Kitchen dutasteride (AVODART) 0.5 MG capsule Take 0.5 mg by mouth daily.      Marland Kitchen escitalopram (LEXAPRO) 20 MG tablet Take 20 mg by mouth daily.      . Eszopiclone (ESZOPICLONE) 3 MG TABS Take 6 mg by mouth at bedtime. Take immediately before bedtime      . folic acid (FOLVITE) 1 MG tablet Take 1 mg by mouth daily.      Marland Kitchen levothyroxine (SYNTHROID, LEVOTHROID) 150 MCG tablet Take 150 mcg by mouth daily before breakfast.      . lisinopril (PRINIVIL,ZESTRIL) 40 MG tablet Take 40 mg by mouth daily.      Marland Kitchen omega-3 acid ethyl esters (LOVAZA) 1 G capsule Take 2 g by  mouth 2 (two) times daily.      . simvastatin (ZOCOR) 20 MG tablet Take 20 mg by mouth daily at 6 PM.        Home: Home Living Family/patient expects to be discharged to:: Private residence Living Arrangements: Spouse/significant other Available Help at Discharge: Family;Available 24 hours/day Type of Home: House Home Access: Stairs to enter Entergy CorporationEntrance Stairs-Number of Steps: 4 Entrance Stairs-Rails: None Home Layout: Multi-level Alternate Level Stairs-Number of Steps: 13 Alternate Level Stairs-Rails: Left Home Equipment: Walker - 4 wheels;Cane - quad  Functional History: Prior Function Level of Independence: Independent Functional Status:  Mobility: Bed Mobility Overal bed mobility:  Needs Assistance Bed Mobility: Rolling;Sidelying to Sit Rolling: Min guard Sidelying to sit: Min guard General bed mobility comments: Close min guard for safety. VC for technique. Requires considerable effort to achieve seated position. Transfers Overall transfer level: Needs assistance Equipment used: Rolling walker (2 wheeled) Transfers: Sit to/from Stand Sit to Stand: Mod assist General transfer comment: Min assist for balance to rise from lowest bed setting. Loss of balance anteriorly once standing requiring Mod assist to correct. Ambulation/Gait Ambulation/Gait assistance: Mod assist Ambulation Distance (Feet): 100 Feet Assistive device: Rolling walker (2 wheeled) Gait Pattern/deviations: Step-through pattern;Staggering right;Decreased stride length Gait velocity interpretation: Below normal speed for age/gender General Gait Details: Educated on safe DME use with a rolling walker. Min guard throughout majority of ambulatory bout however patient had 2 bouts of loss of balance requiring mod assist to prevent falling. Cues to maintain WB through UEs for prevention of tipping walker over. Pt denies any dizziness but states he feels like he is "in a tunnel."    ADL:    Cognition: Cognition Overall Cognitive Status: Within Functional Limits for tasks assessed Cognition Arousal/Alertness: Awake/alert Behavior During Therapy: WFL for tasks assessed/performed Overall Cognitive Status: Within Functional Limits for tasks assessed  Blood pressure 118/59, pulse 76, temperature 98.2 F (36.8 C), temperature source Oral, resp. rate 19, height 6' (1.829 m), weight 120.385 kg (265 lb 6.4 oz), SpO2 94.00%. Physical Exam  Constitutional: He is oriented to person, place, and time.  HENT:  Head: Normocephalic.  Eyes: EOM are normal.  Neck: Normal range of motion. Neck supple. No thyromegaly present.  Cardiovascular:  Cardiac rate controlled  Respiratory: Effort normal and breath sounds  normal.  GI: Soft. Bowel sounds are normal. He exhibits no distension.  Neurological: He is alert and oriented to person, place, and time.  Skin: Skin is warm and dry.    Results for orders placed during the hospital encounter of 06/20/14 (from the past 24 hour(s))  COMPREHENSIVE METABOLIC PANEL     Status: Abnormal   Collection Time    06/20/14 11:42 AM      Result Value Ref Range   Sodium 137  137 - 147 mEq/L   Potassium 4.5  3.7 - 5.3 mEq/L   Chloride 101  96 - 112 mEq/L   CO2 22  19 - 32 mEq/L   Glucose, Bld 141 (*) 70 - 99 mg/dL   BUN 21  6 - 23 mg/dL   Creatinine, Ser 6.961.72 (*) 0.50 - 1.35 mg/dL   Calcium 9.2  8.4 - 29.510.5 mg/dL   Total Protein 7.4  6.0 - 8.3 g/dL   Albumin 3.4 (*) 3.5 - 5.2 g/dL   AST 21  0 - 37 U/L   ALT 18  0 - 53 U/L   Alkaline Phosphatase 91  39 - 117 U/L   Total Bilirubin 0.5  0.3 - 1.2 mg/dL   GFR calc non Af Amer 36 (*) >90 mL/min   GFR calc Af Amer 41 (*) >90 mL/min   Anion gap 14  5 - 15  CBC WITH DIFFERENTIAL     Status: None   Collection Time    06/20/14 11:42 AM      Result Value Ref Range   WBC 9.7  4.0 - 10.5 K/uL   RBC 4.49  4.22 - 5.81 MIL/uL   Hemoglobin 13.8  13.0 - 17.0 g/dL   HCT 16.1  09.6 - 04.5 %   MCV 92.0  78.0 - 100.0 fL   MCH 30.7  26.0 - 34.0 pg   MCHC 33.4  30.0 - 36.0 g/dL   RDW 40.9  81.1 - 91.4 %   Platelets 196  150 - 400 K/uL   Neutrophils Relative % 76  43 - 77 %   Neutro Abs 7.4  1.7 - 7.7 K/uL   Lymphocytes Relative 16  12 - 46 %   Lymphs Abs 1.5  0.7 - 4.0 K/uL   Monocytes Relative 7  3 - 12 %   Monocytes Absolute 0.7  0.1 - 1.0 K/uL   Eosinophils Relative 1  0 - 5 %   Eosinophils Absolute 0.1  0.0 - 0.7 K/uL   Basophils Relative 0  0 - 1 %   Basophils Absolute 0.0  0.0 - 0.1 K/uL  PROTIME-INR     Status: None   Collection Time    06/20/14 11:42 AM      Result Value Ref Range   Prothrombin Time 15.0  11.6 - 15.2 seconds   INR 1.17  0.00 - 1.49  TROPONIN I     Status: None   Collection Time     06/20/14 11:42 AM      Result Value Ref Range   Troponin I <0.30  <0.30 ng/mL  PRO B NATRIURETIC PEPTIDE     Status: Abnormal   Collection Time    06/20/14 11:48 AM      Result Value Ref Range   Pro B Natriuretic peptide (BNP) 930.1 (*) 0 - 450 pg/mL  URINALYSIS, ROUTINE W REFLEX MICROSCOPIC     Status: Abnormal   Collection Time    06/20/14  3:42 PM      Result Value Ref Range   Color, Urine YELLOW  YELLOW   APPearance CLEAR  CLEAR   Specific Gravity, Urine 1.023  1.005 - 1.030   pH 6.0  5.0 - 8.0   Glucose, UA NEGATIVE  NEGATIVE mg/dL   Hgb urine dipstick TRACE (*) NEGATIVE   Bilirubin Urine NEGATIVE  NEGATIVE   Ketones, ur NEGATIVE  NEGATIVE mg/dL   Protein, ur NEGATIVE  NEGATIVE mg/dL   Urobilinogen, UA 1.0  0.0 - 1.0 mg/dL   Nitrite NEGATIVE  NEGATIVE   Leukocytes, UA NEGATIVE  NEGATIVE  URINE MICROSCOPIC-ADD ON     Status: Abnormal   Collection Time    06/20/14  3:42 PM      Result Value Ref Range   Squamous Epithelial / LPF FEW (*) RARE   WBC, UA 0-2  <3 WBC/hpf   RBC / HPF 0-2  <3 RBC/hpf  TSH     Status: Abnormal   Collection Time    06/20/14  6:40 PM      Result Value Ref Range   TSH 6.590 (*) 0.350 - 4.500 uIU/mL  TROPONIN I     Status: None   Collection  Time    06/20/14  6:40 PM      Result Value Ref Range   Troponin I <0.30  <0.30 ng/mL  TROPONIN I     Status: None   Collection Time    06/21/14 12:01 AM      Result Value Ref Range   Troponin I <0.30  <0.30 ng/mL  TROPONIN I     Status: None   Collection Time    06/21/14  4:36 AM      Result Value Ref Range   Troponin I <0.30  <0.30 ng/mL   Dg Chest 2 View  06/20/2014   CLINICAL DATA:  Status post fall 06/16/2014. Mid chest and lower thoracic pain.  EXAM: CHEST  2 VIEW  COMPARISON:  PA and lateral chest 02/03/2014.  FINDINGS: The lungs are clear. Heart size is mildly enlarged. The aorta is tortuous and ectatic. No pneumothorax or pleural effusion. No focal bony abnormality.  IMPRESSION: No acute  disease.  Stable compared to prior exam.   Electronically Signed   By: Drusilla Kanner M.D.   On: 06/20/2014 13:03   Dg Thoracic Spine 2 View  06/20/2014   CLINICAL DATA:  Status post fall 06/16/2014. Mid and lower thoracic pain.  EXAM: THORACIC SPINE - 2 VIEW  COMPARISON:  PA and lateral chest 04/05/2014.  FINDINGS: Vertebral body height and alignment are maintained. Mild degenerative disease is noted. Paraspinous structures show no acute abnormality.  IMPRESSION: No acute finding.   Electronically Signed   By: Drusilla Kanner M.D.   On: 06/20/2014 13:04   Dg Lumbar Spine Complete  06/20/2014   CLINICAL DATA:  Acute lower back spine pain after fall.  EXAM: LUMBAR SPINE - COMPLETE 4+ VIEW  COMPARISON:  None.  FINDINGS: There is no evidence of lumbar spine fracture. Alignment is normal. Intervertebral disc spaces are maintained. Minimal spurring is noted anteriorly at L2-3, L3-4 and L4-5.  IMPRESSION: Minimal degenerative changes as described above. No acute abnormality seen in the lumbar spine.   Electronically Signed   By: Roque Lias M.D.   On: 06/20/2014 13:05   Ct Head Wo Contrast  06/20/2014   CLINICAL DATA:  80 year old male with history of trauma from a fall complaining of headache.  EXAM: CT HEAD WITHOUT CONTRAST  TECHNIQUE: Contiguous axial images were obtained from the base of the skull through the vertex without intravenous contrast.  COMPARISON:  No priors.  FINDINGS: Moderate cerebral and mild cerebellar atrophy. Patchy and confluent areas of decreased attenuation are noted throughout the deep and periventricular white matter of the cerebral hemispheres bilaterally, compatible with chronic microvascular ischemic disease. Postoperative changes in the it left frontal bone, deep to which there are several surgical clips. Aneurysm wing clip in the region of the left ICA. No acute displaced skull fracture. No acute intracranial abnormality. Specifically, no definite signs of acute  posttraumatic intracranial hemorrhage, no large vessel region of acute/ subacute cerebral ischemia, no mass, mass effect, hydrocephalus or abnormal intra or extra-axial fluid collections. Mastoids are well pneumatized bilaterally. Complete opacification and mucoperiosteal thickening in the right maxillary sinus. Multifocal mucosal thickening in the right ethmoid sinuses. Right turbinates have been resected. Status post right maxillary antrectomy.  IMPRESSION: 1. No signs of significant acute traumatic injury to the skull or brain. 2. Postoperative changes from prior left-sided aneurysm clipping. 3. Moderate cerebral and mild cerebellar atrophy with extensive chronic microvascular ischemic changes in the cerebral white matter, as above. 4. Extensive paranasal sinus disease and postoperative changes,  as above.   Electronically Signed   By: Trudie Reedaniel  Entrikin M.D.   On: 06/20/2014 12:51    Assessment/Plan: Diagnosis: 80 yo male admitted with DOE, multifactorial gait disorder 1. Does the need for close, 24 hr/day medical supervision in concert with the patient's rehab needs make it unreasonable for this patient to be served in a less intensive setting? Potentially 2. Co-Morbidities requiring supervision/potential complications: afib, htn, osa 3. Due to bladder management, bowel management, safety, skin/wound care, disease management and medication administration, does the patient require 24 hr/day rehab nursing? Potentially 4. Does the patient require coordinated care of a physician, rehab nurse, PT, OT to address physical and functional deficits in the context of the above medical diagnosis(es)? Potentially Addressing deficits in the following areas: balance, endurance, locomotion, strength, transferring, bowel/bladder control, bathing and dressing 5. Can the patient actively participate in an intensive therapy program of at least 3 hrs of therapy per day at least 5 days per week? Potentially 6. The potential  for patient to make measurable gains while on inpatient rehab is TBD 7. Anticipated functional outcomes upon discharge from inpatient rehab are TBD  with PT, TBD with OT, TBD with SLP. 8. Estimated rehab length of stay to reach the above functional goals is: TBD 9. Does the patient have adequate social supports to accommodate these discharge functional goals? Yes 10. Anticipated D/C setting: Home 11. Anticipated post D/C treatments: HH therapy 12. Overall Rehab/Functional Prognosis: good  RECOMMENDATIONS: This patient's condition is appropriate for continued rehabilitative care in the following setting: Likely home health Patient has agreed to participate in recommended program. Potentially Note that insurance prior authorization may be required for reimbursement for recommended care.  Comment: This patient was admitted yesterday with DOE. Work up ongoing. Will follow for functional and medical progress. I don't expect, however, that he will require an inpatient rehab stay.   Ranelle OysterZachary T. Vallen Calabrese, MD, [facility] Rehabilitation 06/21/2014     06/21/2014

## 2014-06-21 NOTE — Progress Notes (Signed)
[facility] PROGRESS NOTE  Shari ProwsRobert Chrysler Bannan WUJ:[id number] DOB: September 14, 1931 DOA: 06/20/2014  PCP: Cain SaupeFULP, CAMMIE, MD  Brief HPI: Shari ProwsRobert Chrysler Vandruff is a 80 y.o. male with a past medical history significant for A fib, HTN, hypothyroidism, HLD. He was brought from his PCP's office where he was found short of breath and with elevated heart rate. He was in Afib with RVR.  Past medical history:  Past Medical History  Diagnosis Date  . Atrial fibrillation   . OSA (obstructive sleep apnea)   . Hypertension   . Hypothyroidism   . Depression   . Hypercholesteremia   . Plantar fasciitis   . Seasonal allergies   . Decreased testosterone level   . H/O ventral hernia   . emphysema   . Varicose veins     Consultants: None. Admitting MD discussed with patient's cardiologist over phone.  Procedures:  2 D ECHO Pending  Antibiotics: None  Subjective: Patient feels better. His back pain is chronic. Denies chest pain. Has been unsteady at home with near syncopal episodes though he and his wife are not sure.  Objective: Vital Signs  Filed Vitals:   06/20/14 1601 06/20/14 2019 06/21/14 0419 06/21/14 1147  BP: 113/46 135/56 118/59 136/46  Pulse: 95 78 76 84  Temp: 99.8 F (37.7 C) 98.8 F (37.1 C) 98.2 F (36.8 C)   TempSrc: Oral Oral Oral   Resp: 20 19 19    Height: 6' (1.829 m)     Weight: 120.385 kg (265 lb 6.4 oz)     SpO2: 96% 96% 94%     Intake/Output Summary (Last 24 hours) at 06/21/14 1340 Last data filed at 06/21/14 0900  Gross per 24 hour  Intake    120 ml  Output      0 ml  Net    120 ml   Filed Weights   06/20/14 1601  Weight: 120.385 kg (265 lb 6.4 oz)    General appearance: alert, cooperative, appears stated age and no distress Head: Normocephalic, without obvious abnormality, atraumatic Resp: clear to auscultation bilaterally Cardio: Irregularly irregular. Systolic murmur over the apex. Minimal pedal edema. GI: soft, non-tender; bowel  sounds normal; no masses,  no organomegaly Extremities: edema minimal Neurologic: No facial asymmetry. Tongue is midline. Strength is equal, bilateral upper and lower extremities.  Lab Results:  Basic Metabolic Panel:  Recent Labs Lab 06/20/14 1142  NA 137  K 4.5  CL 101  CO2 22  GLUCOSE 141*  BUN 21  CREATININE 1.72*  CALCIUM 9.2   Liver Function Tests:  Recent Labs Lab 06/20/14 1142  AST 21  ALT 18  ALKPHOS 91  BILITOT 0.5  PROT 7.4  ALBUMIN 3.4*   CBC:  Recent Labs Lab 06/20/14 1142  WBC 9.7  NEUTROABS 7.4  HGB 13.8  HCT 41.3  MCV 92.0  PLT 196   Cardiac Enzymes:  Recent Labs Lab 06/20/14 1142 06/20/14 1840 06/21/14 0001 06/21/14 0436  TROPONINI <0.30 <0.30 <0.30 <0.30   BNP (last 3 results)  Recent Labs  04/05/14 1127 06/20/14 1148  PROBNP 4566.0* 930.1*     Studies/Results: Dg Chest 2 View  06/20/2014   CLINICAL DATA:  Status post fall 06/16/2014. Mid chest and lower thoracic pain.  EXAM: CHEST  2 VIEW  COMPARISON:  PA and lateral chest 02/03/2014.  FINDINGS: The lungs are clear. Heart size is mildly enlarged. The aorta is tortuous and ectatic. No pneumothorax or pleural effusion. No focal bony abnormality.  IMPRESSION: No acute  disease.  Stable compared to prior exam.   Electronically Signed   By: Drusilla Kannerhomas  Dalessio M.D.   On: 06/20/2014 13:03   Dg Thoracic Spine 2 View  06/20/2014   CLINICAL DATA:  Status post fall 06/16/2014. Mid and lower thoracic pain.  EXAM: THORACIC SPINE - 2 VIEW  COMPARISON:  PA and lateral chest 04/05/2014.  FINDINGS: Vertebral body height and alignment are maintained. Mild degenerative disease is noted. Paraspinous structures show no acute abnormality.  IMPRESSION: No acute finding.   Electronically Signed   By: Drusilla Kannerhomas  Dalessio M.D.   On: 06/20/2014 13:04   Dg Lumbar Spine Complete  06/20/2014   CLINICAL DATA:  Acute lower back spine pain after fall.  EXAM: LUMBAR SPINE - COMPLETE 4+ VIEW  COMPARISON:  None.   FINDINGS: There is no evidence of lumbar spine fracture. Alignment is normal. Intervertebral disc spaces are maintained. Minimal spurring is noted anteriorly at L2-3, L3-4 and L4-5.  IMPRESSION: Minimal degenerative changes as described above. No acute abnormality seen in the lumbar spine.   Electronically Signed   By: Roque LiasJames  Green M.D.   On: 06/20/2014 13:05   Ct Head Wo Contrast  06/20/2014   CLINICAL DATA:  80 year old male with history of trauma from a fall complaining of headache.  EXAM: CT HEAD WITHOUT CONTRAST  TECHNIQUE: Contiguous axial images were obtained from the base of the skull through the vertex without intravenous contrast.  COMPARISON:  No priors.  FINDINGS: Moderate cerebral and mild cerebellar atrophy. Patchy and confluent areas of decreased attenuation are noted throughout the deep and periventricular white matter of the cerebral hemispheres bilaterally, compatible with chronic microvascular ischemic disease. Postoperative changes in the it left frontal bone, deep to which there are several surgical clips. Aneurysm wing clip in the region of the left ICA. No acute displaced skull fracture. No acute intracranial abnormality. Specifically, no definite signs of acute posttraumatic intracranial hemorrhage, no large vessel region of acute/ subacute cerebral ischemia, no mass, mass effect, hydrocephalus or abnormal intra or extra-axial fluid collections. Mastoids are well pneumatized bilaterally. Complete opacification and mucoperiosteal thickening in the right maxillary sinus. Multifocal mucosal thickening in the right ethmoid sinuses. Right turbinates have been resected. Status post right maxillary antrectomy.  IMPRESSION: 1. No signs of significant acute traumatic injury to the skull or brain. 2. Postoperative changes from prior left-sided aneurysm clipping. 3. Moderate cerebral and mild cerebellar atrophy with extensive chronic microvascular ischemic changes in the cerebral white matter, as  above. 4. Extensive paranasal sinus disease and postoperative changes, as above.   Electronically Signed   By: Trudie Reedaniel  Entrikin M.D.   On: 06/20/2014 12:51    Medications:  Scheduled: . antiseptic oral rinse  7 mL Mouth Rinse BID  . apixaban  2.5 mg Oral BID  . buPROPion  300 mg Oral Daily  . busPIRone  15 mg Oral BID  . diltiazem  60 mg Oral 4 times per day  . dutasteride  0.5 mg Oral Daily  . escitalopram  20 mg Oral Daily  . folic acid  1 mg Oral Daily  . levothyroxine  150 mcg Oral QAC breakfast  . simvastatin  20 mg Oral q1800  . sodium chloride  3 mL Intravenous Q12H   Continuous:  ZOX:[id number]:clonazePAM, traZODone, zolpidem  Assessment/Plan:  Active Problems:   Cough   Shortness of breath   Atrial fibrillation with RVR   Hypothyroidism   DOE (dyspnea on exertion)    A fib with RVR  Patient was  started on cardizem infusion. HR is well controlled now. Will transition to oral cardizem. Apparently was diagnosed with Afib about 1-2 months ago. On Eliquis. Await ECHO.  Dyspnea on exertion  Possibly due to Afib. Await ECHO. No overt pulmonary edema on CXR. Admitting MD spoke with patient's cardiologist at [facility], Dr. Malissa Hippo and patient is well known to him. Patient has had AI for some time however not much symptomatic from this. Last time he was seen in June. Apparently patient also had a cath in 2014 without major findings. His DOE might be multifactorial from AI as well as poor rate control in the past 1-2 months. Trops are normal.   Hypothyroidism TSH is 6.5. Check Ft4. Continue home meds.  Chronic cough Possibly due to Lisinopril. Admits to sinus drainage. Try Flonase. CXR was clear. Lungs are clear to auscultation. Recently evaluated by Pulm.   Recent falls/Possible Near Syncopal Episodes This may be related to his poorly controlled heart rate. He is on anticoagulation and will maintain that for now. PT/OT to see.   DVT Prophylaxis: Eliquis    Code Status: Full Code    Family Communication: Discussed with patient and wife.  Disposition Plan: Await PT/OT.    LOS: 1 day   [facility]  [facility] Pager [phone] 06/21/2014, 1:40 PM  If 8PM-8AM, please contact night-coverage at [email or web address], password [facility]

## 2014-06-21 NOTE — Progress Notes (Signed)
  Echocardiogram 2D Echocardiogram has been performed.  [name] [name] FRANCES 06/21/2014, 9:08 AM

## 2014-06-21 NOTE — Care Management Note (Signed)
    Page 1 of 2   06/22/2014     3:37:51 PM CARE MANAGEMENT NOTE 06/22/2014  Patient:  [name] [name],[name] [name]   Account Number:  [id number]  Date Initiated:  06/21/2014  Documentation initiated by:  AMERSON,JULIE  Subjective/Objective Assessment:   Pt adm on 06/20/14 with SOB, Afib, RVR.  Recent hx of several falls at home.  PTA, pt resides at home with wife.     Action/Plan:   PT recommending CIR; rehab consult ordered.   Anticipated DC Date:  06/22/2014   Anticipated DC Plan:  HOME W HOME HEALTH SERVICES      DC Planning Services  CM consult      PAC Choice  HOME HEALTH   Choice offered to / List presented to:  C-1 Patient   DME arranged  [facility]      DME agency  [facility]     HH arranged  HH-1 RN  HH-2 PT  HH-3 OT      HH agency  [facility]   Status of service:  Completed, signed off Medicare Important Message given?   (If response is "NO", the following Medicare IM given date fields will be blank) Date Medicare IM given:   Medicare IM given by:   Date Additional Medicare IM given:   Additional Medicare IM given by:    Discharge Disposition:  HOME W HOME HEALTH SERVICES  Per UR Regulation:  Reviewed for med. necessity/level of care/duration of stay  If discussed at Long Length of Stay Meetings, dates discussed:    Comments:  06/22/14 11:52 CM met with pt in room to offer choice of home health agency. Pt chooses [facility] to render HHPT/OT/RN. Address and contact informaiton verified with pt.  Referral called to [facility] rep, Germaine.  DME rep, James to deliver RW to room prior to dishcarge.  No other CM needs were communicated.   , BSN, CM [phone].   

## 2014-06-22 LAB — CBC
HEMATOCRIT: 37.7 % — AB (ref 39.0–52.0)
Hemoglobin: 12.7 g/dL — ABNORMAL LOW (ref 13.0–17.0)
MCH: 31.3 pg (ref 26.0–34.0)
MCHC: 33.7 g/dL (ref 30.0–36.0)
MCV: 92.9 fL (ref 78.0–100.0)
Platelets: 196 10*3/uL (ref 150–400)
RBC: 4.06 MIL/uL — ABNORMAL LOW (ref 4.22–5.81)
RDW: 14.7 % (ref 11.5–15.5)
WBC: 9.3 10*3/uL (ref 4.0–10.5)

## 2014-06-22 LAB — BASIC METABOLIC PANEL
Anion gap: 15 (ref 5–15)
BUN: 20 mg/dL (ref 6–23)
CHLORIDE: 103 meq/L (ref 96–112)
CO2: 21 mEq/L (ref 19–32)
Calcium: 9 mg/dL (ref 8.4–10.5)
Creatinine, Ser: 1.54 mg/dL — ABNORMAL HIGH (ref 0.50–1.35)
GFR calc Af Amer: 47 mL/min — ABNORMAL LOW (ref >90)
GFR calc non Af Amer: 41 mL/min — ABNORMAL LOW (ref >90)
GLUCOSE: 115 mg/dL — AB (ref 70–99)
POTASSIUM: 4.4 meq/L (ref 3.7–5.3)
Sodium: 139 mEq/L (ref 137–147)

## 2014-06-22 MED ORDER — APIXABAN 2.5 MG PO TABS: 2.5000 mg | ORAL_TABLET | Freq: Two times a day (BID) | ORAL | Status: DC

## 2014-06-22 MED ORDER — DILTIAZEM HCL ER COATED BEADS 240 MG PO CP24
240.0000 mg | ORAL_CAPSULE | Freq: Every day | ORAL | Status: DC
  Administered 2014-06-22: 240 mg via ORAL
  Filled 2014-06-22: qty 1

## 2014-06-22 MED ORDER — DILTIAZEM HCL ER COATED BEADS 240 MG PO CP24: 240.0000 mg | ORAL_CAPSULE | Freq: Every day | ORAL | Status: DC

## 2014-06-22 MED ORDER — ACETAMINOPHEN 325 MG PO TABS
650.0000 mg | ORAL_TABLET | Freq: Once | ORAL | Status: AC
  Administered 2014-06-22: 650 mg via ORAL

## 2014-06-22 MED ORDER — FLUTICASONE PROPIONATE 50 MCG/ACT NA SUSP: 1.0000 | Freq: Every day | NASAL | Status: DC

## 2014-06-22 NOTE — Progress Notes (Signed)
Noted orders to Dc patient home.Case manager contacted for home health needs.DC instructions given to patient and patient mother.Verbalized understanding

## 2014-06-22 NOTE — Discharge Summary (Signed)
[facility]  Physician Discharge Summary   Patient ID: [name] ProwsRobert Chrysler Wenker MRN: [id number] DOB/AGE: 220-Apr-1933 80 y.o.  Admit date: 06/20/2014 Discharge date: 06/22/2014  PCP: Cain SaupeFULP, CAMMIE, MD  DISCHARGE DIAGNOSES:  Active Problems:   Cough   Shortness of breath   Atrial fibrillation with RVR   Hypothyroidism   DOE (dyspnea on exertion)   RECOMMENDATIONS FOR OUTPATIENT FOLLOW UP: 1. Lisinopril has been stopped due to cough and renal isufficiency.  2. Home health RN, PT and OT 3. [facility] He is to call on Monday for appointment. 4. Check Thyroid function tests in 4 weeks  DISCHARGE CONDITION: fair  Diet recommendation: Heart healthy  Filed Weights   06/20/14 1601  Weight: 120.385 kg (265 lb 6.4 oz)    INITIAL HISTORY: [name] [name] is a 80 y.o. male with a past medical history significant for A fib, HTN, hypothyroidism, HLD. He was brought from his PCP's office where he was found short of breath and with elevated heart rate. He was in Afib with RVR.  Consultations:  None  Procedures: 2D ECHO Study Conclusions - Left ventricle: The cavity size was moderately dilated. Wall thickness was increased in a pattern of mild LVH. The estimated ejection fraction was 60%. Wall motion was normal; there were no regional wall motion abnormalities.  - Aortic valve: There is AI that is at least moderate. Sclerosis without stenosis. - Left atrium: The atrium was mildly dilated. - Right ventricle: The cavity size was normal. RV systolic pressure (S, est): 42 mm Hg. - Pulmonary arteries: PA peak pressure: 42 mm Hg (S).  HOSPITAL COURSE:   A fib with RVR  Patient was started on cardizem infusion. HR responded well. He was transitioned to oral cardizem. He has been stable the last 24 hours. He will be changed over to a higher dose of LA cardizem. He is on Eliquis for anticoagulation, the dose of which was adjusted as he is  80 years old and has creatinine higher than 1.5. ECHO as above.  Dyspnea on exertion  Most likely due to Afib. ECHO shows normal EF and moderate AI. Admitting MD spoke with patient's cardiologist at [facility], Dr. Malissa Hippohomas Gehrig and patient is well known to him. Patient has had AI for some time however not much symptomatic from this. Last time he was seen in June. Apparently patient also had a cath in 2014 without major findings. His DOE might be multifactorial from AI as well as poor rate control in the past 1-2 months. Trops are normal. Saturating well on RA.  Hypothyroidism  TSH is 6.5. Ft4 is 0.9. Continue home meds. Follow up as OP.  Chronic cough  Possibly due to Lisinopril. Admits to sinus drainage. Flonase has been prescribed. CXR was clear. Lungs are clear to auscultation. Recently evaluated by Pulm as well. Have stopped lisinopril as well.  Elevated Creatinine Likely chronic. ACEI held for now. His cardiologist and PCP to pursue further.  Recent falls/Possible Near Syncopal Episodes  This may be related to his poorly controlled heart rate. He is on anticoagulation and will maintain that for now as per his cardiologist. PT evaluated and recommended inpatient rehab. Rehab MD evaluated and does not feel patient needs inpatient rehab. Home health was recommended and will be ordered.  Overall he remains stable for discharge.   PERTINENT LABS:  The results of significant diagnostics from this hospitalization (including imaging, microbiology, ancillary and laboratory) are listed below for reference.  Labs: Basic Metabolic Panel:  Recent Labs Lab 06/20/14 1142 06/21/14 1606 06/22/14 0251  NA 137 136* 139  K 4.5 4.8 4.4  CL 101 101 103  CO2 22 22 21   GLUCOSE 141* 105* 115*  BUN 21 22 20   CREATININE 1.72* 1.57* 1.54*  CALCIUM 9.2 9.2 9.0   Liver Function Tests:  Recent Labs Lab 06/20/14 1142  AST 21  ALT 18  ALKPHOS 91  BILITOT 0.5  PROT 7.4  ALBUMIN 3.4*    CBC:  Recent Labs Lab 06/20/14 1142 06/22/14 0251  WBC 9.7 9.3  NEUTROABS 7.4  --   HGB 13.8 12.7*  HCT 41.3 37.7*  MCV 92.0 92.9  PLT 196 196   Cardiac Enzymes:  Recent Labs Lab 06/20/14 1142 06/20/14 1840 06/21/14 0001 06/21/14 0436  TROPONINI <0.30 <0.30 <0.30 <0.30   BNP: BNP (last 3 results)  Recent Labs  04/05/14 1127 06/20/14 1148  PROBNP 4566.0* 930.1*     IMAGING STUDIES Dg Chest 2 View  06/20/2014   CLINICAL DATA:  Status post fall 06/16/2014. Mid chest and lower thoracic pain.  EXAM: CHEST  2 VIEW  COMPARISON:  PA and lateral chest 02/03/2014.  FINDINGS: The lungs are clear. Heart size is mildly enlarged. The aorta is tortuous and ectatic. No pneumothorax or pleural effusion. No focal bony abnormality.  IMPRESSION: No acute disease.  Stable compared to prior exam.   Electronically Signed   By: Drusilla Kannerhomas  Dalessio M.D.   On: 06/20/2014 13:03   Dg Thoracic Spine 2 View  06/20/2014   CLINICAL DATA:  Status post fall 06/16/2014. Mid and lower thoracic pain.  EXAM: THORACIC SPINE - 2 VIEW  COMPARISON:  PA and lateral chest 04/05/2014.  FINDINGS: Vertebral body height and alignment are maintained. Mild degenerative disease is noted. Paraspinous structures show no acute abnormality.  IMPRESSION: No acute finding.   Electronically Signed   By: Drusilla Kannerhomas  Dalessio M.D.   On: 06/20/2014 13:04   Dg Lumbar Spine Complete  06/20/2014   CLINICAL DATA:  Acute lower back spine pain after fall.  EXAM: LUMBAR SPINE - COMPLETE 4+ VIEW  COMPARISON:  None.  FINDINGS: There is no evidence of lumbar spine fracture. Alignment is normal. Intervertebral disc spaces are maintained. Minimal spurring is noted anteriorly at L2-3, L3-4 and L4-5.  IMPRESSION: Minimal degenerative changes as described above. No acute abnormality seen in the lumbar spine.   Electronically Signed   By: Roque LiasJames  Green M.D.   On: 06/20/2014 13:05   Ct Head Wo Contrast  06/20/2014   CLINICAL DATA:  80 year old male  with history of trauma from a fall complaining of headache.  EXAM: CT HEAD WITHOUT CONTRAST  TECHNIQUE: Contiguous axial images were obtained from the base of the skull through the vertex without intravenous contrast.  COMPARISON:  No priors.  FINDINGS: Moderate cerebral and mild cerebellar atrophy. Patchy and confluent areas of decreased attenuation are noted throughout the deep and periventricular white matter of the cerebral hemispheres bilaterally, compatible with chronic microvascular ischemic disease. Postoperative changes in the it left frontal bone, deep to which there are several surgical clips. Aneurysm wing clip in the region of the left ICA. No acute displaced skull fracture. No acute intracranial abnormality. Specifically, no definite signs of acute posttraumatic intracranial hemorrhage, no large vessel region of acute/ subacute cerebral ischemia, no mass, mass effect, hydrocephalus or abnormal intra or extra-axial fluid collections. Mastoids are well pneumatized bilaterally. Complete opacification and mucoperiosteal thickening in the right maxillary sinus. Multifocal mucosal  thickening in the right ethmoid sinuses. Right turbinates have been resected. Status post right maxillary antrectomy.  IMPRESSION: 1. No signs of significant acute traumatic injury to the skull or brain. 2. Postoperative changes from prior left-sided aneurysm clipping. 3. Moderate cerebral and mild cerebellar atrophy with extensive chronic microvascular ischemic changes in the cerebral white matter, as above. 4. Extensive paranasal sinus disease and postoperative changes, as above.   Electronically Signed   By: Trudie Reed M.D.   On: 06/20/2014 12:51    DISCHARGE EXAMINATION: Filed Vitals:   06/21/14 1353 06/21/14 1709 06/21/14 2026 06/22/14 0500  BP: 136/52 134/61 124/61 103/50  Pulse: 70 87  103  Temp: 97.9 F (36.6 C)  99.3 F (37.4 C) 98.3 F (36.8 C)  TempSrc: Oral  Oral Oral  Resp: 18   18  Height:       Weight:      SpO2: 96% 92% 95% 92%   General appearance: alert, cooperative and no distress Resp: clear to auscultation bilaterally Cardio: irregularly irregular.  GI: soft, non-tender; bowel sounds normal; no masses,  no organomegaly  DISPOSITION: Home with home health  Discharge Instructions   Call MD for:  difficulty breathing, headache or visual disturbances    Complete by:  As directed      Call MD for:  persistant dizziness or light-headedness    Complete by:  As directed      Call MD for:  persistant nausea and vomiting    Complete by:  As directed      Call MD for:  severe uncontrolled pain    Complete by:  As directed      Diet - low sodium heart healthy    Complete by:  As directed      Discharge instructions    Complete by:  As directed   Please call your cardiologist on Monday to schedule follow up appointment.     Increase activity slowly    Complete by:  As directed            ALLERGIES: No Known Allergies  Current Discharge Medication List    START taking these medications   Details  fluticasone (FLONASE) 50 MCG/ACT nasal spray Place 1 spray into both nostrils daily. Qty: 9.9 g, Refills: 2      CONTINUE these medications which have CHANGED   Details  apixaban (ELIQUIS) 2.5 MG TABS tablet Take 1 tablet (2.5 mg total) by mouth 2 (two) times daily. Qty: 60 tablet, Refills: 3    diltiazem (CARDIZEM CD) 240 MG 24 hr capsule Take 1 capsule (240 mg total) by mouth daily. Qty: 30 capsule, Refills: 3      CONTINUE these medications which have NOT CHANGED   Details  buPROPion (WELLBUTRIN XL) 300 MG 24 hr tablet Take 300 mg by mouth daily.    busPIRone (BUSPAR) 15 MG tablet Take 15 mg by mouth 2 (two) times daily.    cholecalciferol (VITAMIN D) 1000 UNITS tablet Take 2,000 Units by mouth daily.    clonazePAM (KLONOPIN) 0.5 MG tablet Take 0.5 mg by mouth at bedtime as needed for anxiety.    diphenhydrAMINE (BENADRYL) 25 mg capsule Take 25 mg by mouth  every 6 (six) hours as needed for sleep.    dutasteride (AVODART) 0.5 MG capsule Take 0.5 mg by mouth daily.    escitalopram (LEXAPRO) 20 MG tablet Take 20 mg by mouth daily.    Eszopiclone (ESZOPICLONE) 3 MG TABS Take 6 mg by mouth at bedtime.  Take immediately before bedtime    folic acid (FOLVITE) 1 MG tablet Take 1 mg by mouth daily.    levothyroxine (SYNTHROID, LEVOTHROID) 150 MCG tablet Take 150 mcg by mouth daily before breakfast.    omega-3 acid ethyl esters (LOVAZA) 1 G capsule Take 2 g by mouth 2 (two) times daily.    simvastatin (ZOCOR) 20 MG tablet Take 20 mg by mouth daily at 6 PM.      STOP taking these medications     lisinopril (PRINIVIL,ZESTRIL) 40 MG tablet        Follow-up Information   Follow up with FULP, CAMMIE, MD. Schedule an appointment as soon as possible for a visit in 1 week. (post hospitalization follow up)    Specialty:  Family Medicine   Contact information:   [address] [place] [address] [place] [address] [phone]       Call Oralia Manis (for appointment)    Specialty:  Cardiology   Contact information:   [address] [place] [address] [phone]       TOTAL DISCHARGE TIME: 35 mins  [employer] [facility]  [facility] Pager [phone]  06/22/2014, 11:32 AM

## 2014-06-22 NOTE — Discharge Instructions (Signed)
Atrial Fibrillation °Atrial fibrillation is a condition that causes your heart to beat irregularly. It may also cause your heart to beat faster than normal. Atrial fibrillation can prevent your heart from pumping blood normally. It increases your risk of stroke and heart problems. °HOME CARE °· Take medications as told by your doctor. °· Only take medications that your doctor says are safe. Some medications can make the condition worse or happen again. °· If blood thinners were prescribed by your doctor, take them exactly as told. Too much can cause bleeding. Too little and you will not have the needed protection against stroke and other problems. °· Perform blood tests at home if told by your doctor. °· Perform blood tests exactly as told by your doctor. °· Do not drink alcohol. °· Do not drink beverages with caffeine such as coffee, soda, and some teas. °· Maintain a healthy weight. °· Do not use diet pills unless your doctor says they are safe. They may make heart problems worse. °· Follow diet instructions as told by your doctor. °· Exercise regularly as told by your doctor. °· Keep all follow-up appointments. °GET HELP IF: °· You notice a change in the speed, rhythm, or strength of your heartbeat. °· You suddenly begin peeing (urinating) more often. °· You get tired more easily when moving or exercising. °GET HELP RIGHT AWAY IF:  °· You have chest or belly (abdominal) pain. °· You feel sick to your stomach (nauseous). °· You are short of breath. °· You suddenly have swollen feet and ankles. °· You feel dizzy. °· You face, arms, or legs feel numb or weak. °· There is a change in your vision or speech. °MAKE SURE YOU:  °· Understand these instructions. °· Will watch your condition. °· Will get help right away if you are not doing well or get worse. °Document Released: 05/25/2008 Document Revised: 12/31/2013 Document Reviewed: 09/26/2012 °ExitCare® Patient Information ©2015 [employer]. This information is not  intended to replace advice given to you by your health care provider. Make sure you discuss any questions you have with your health care provider. ° ° ° ° ° °Apixaban oral tablets °What is this medicine? °APIXABAN (a PIX a ban) is an anticoagulant (blood thinner). It is used to lower the chance of stroke in people with a medical condition called atrial fibrillation. It is also used to treat or prevent blood clots in the lungs or in the veins. °This medicine may be used for other purposes; ask your health care provider or pharmacist if you have questions. °COMMON BRAND NAME(S): Eliquis °What should I tell my health care provider before I take this medicine? °They need to know if you have any of these conditions: °-bleeding disorders °-bleeding in the brain °-blood in your stools (black or tarry stools) or if you have blood in your vomit °-history of stomach bleeding °-kidney disease °-liver disease °-mechanical heart valve °-an unusual or allergic reaction to apixaban, other medicines, foods, dyes, or preservatives °-pregnant or trying to get pregnant °-breast-feeding °How should I use this medicine? °Take this medicine by mouth with a glass of water. Follow the directions on the prescription label. You can take it with or without food. If it upsets your stomach, take it with food. Take your medicine at regular intervals. Do not take it more often than directed. Do not stop taking except on your doctor's advice. Stopping this medicine may increase your risk of a blot clot. Be sure to refill your prescription before   you run out of medicine. °Talk to your pediatrician regarding the use of this medicine in children. Special care may be needed. °Overdosage: If you think you have taken too much of this medicine contact a poison control center or emergency room at once. °NOTE: This medicine is only for you. Do not share this medicine with others. °What if I miss a dose? °If you miss a dose, take it as soon as you can. If  it is almost time for your next dose, take only that dose. Do not take double or extra doses. °What may interact with this medicine? °This medicine may interact with the following: °-aspirin and aspirin-like medicines °-certain medicines for fungal infections like ketoconazole and itraconazole °-certain medicines for seizures like carbamazepine and phenytoin °-certain medicines that treat or prevent blood clots like warfarin, enoxaparin, and dalteparin °-clarithromycin °-NSAIDs, medicines for pain and inflammation, like ibuprofen or naproxen °-rifampin °-ritonavir °-St. John's wort °This list may not describe all possible interactions. Give your health care provider a list of all the medicines, herbs, non-prescription drugs, or dietary supplements you use. Also tell them if you smoke, drink alcohol, or use illegal drugs. Some items may interact with your medicine. °What should I watch for while using this medicine? °Notify your doctor or health care professional and seek emergency treatment if you develop breathing problems; changes in vision; chest pain; severe, sudden headache; pain, swelling, warmth in the leg; trouble speaking; sudden numbness or weakness of the face, arm, or leg. These can be signs that your condition has gotten worse. °If you are going to have surgery, tell your doctor or health care professional that you are taking this medicine. °Tell your health care professional that you use this medicine before you have a spinal or epidural procedure. Sometimes people who take this medicine have bleeding problems around the spine when they have a spinal or epidural procedure. This bleeding is very rare. If you have a spinal or epidural procedure while on this medicine, call your health care professional immediately if you have back pain, numbness or tingling (especially in your legs and feet), muscle weakness, paralysis, or loss of bladder or bowel control. °Avoid sports and activities that might cause  injury while you are using this medicine. Severe falls or injuries can cause unseen bleeding. Be careful when using sharp tools or knives. Consider using an electric razor. Take special care brushing or flossing your teeth. Report any injuries, bruising, or red spots on the skin to your doctor or health care professional. °What side effects may I notice from receiving this medicine? °Side effects that you should report to your doctor or health care professional as soon as possible: °-allergic reactions like skin rash, itching or hives, swelling of the face, lips, or tongue °-signs and symptoms of bleeding such as bloody or black, tarry stools; red or dark-brown urine; spitting up blood or brown material that looks like coffee grounds; red spots on the skin; unusual bruising or bleeding from the eye, gums, or nose °This list may not describe all possible side effects. Call your doctor for medical advice about side effects. You may report side effects to FDA at 1-800-FDA-1088. °Where should I keep my medicine? °Keep out of the reach of children. °Store at room temperature between 20 and 25 degrees C (68 and 77 degrees F). Throw away any unused medicine after the expiration date. °NOTE: This sheet is a summary. It may not cover all possible information. If you have questions about this   medicine, talk to your doctor, pharmacist, or health care provider. °© 2015, [employer]. (2013-04-20 11:59:24) ° °

## 2014-06-22 NOTE — Progress Notes (Signed)
Rolling walker provided for DC

## 2014-06-25 DIAGNOSIS — E039 Hypothyroidism, unspecified: Secondary | ICD-10-CM | POA: Diagnosis not present

## 2014-06-25 DIAGNOSIS — Z7901 Long term (current) use of anticoagulants: Secondary | ICD-10-CM | POA: Diagnosis not present

## 2014-06-25 DIAGNOSIS — Z9181 History of falling: Secondary | ICD-10-CM | POA: Diagnosis not present

## 2014-06-25 DIAGNOSIS — I480 Paroxysmal atrial fibrillation: Secondary | ICD-10-CM | POA: Diagnosis not present

## 2014-06-25 DIAGNOSIS — I10 Essential (primary) hypertension: Secondary | ICD-10-CM | POA: Diagnosis not present

## 2014-07-01 DIAGNOSIS — I10 Essential (primary) hypertension: Secondary | ICD-10-CM | POA: Diagnosis not present

## 2014-07-01 DIAGNOSIS — Z7901 Long term (current) use of anticoagulants: Secondary | ICD-10-CM | POA: Diagnosis not present

## 2014-07-01 DIAGNOSIS — I480 Paroxysmal atrial fibrillation: Secondary | ICD-10-CM | POA: Diagnosis not present

## 2014-07-01 DIAGNOSIS — E039 Hypothyroidism, unspecified: Secondary | ICD-10-CM | POA: Diagnosis not present

## 2014-07-01 DIAGNOSIS — Z9181 History of falling: Secondary | ICD-10-CM | POA: Diagnosis not present

## 2014-07-05 DIAGNOSIS — I10 Essential (primary) hypertension: Secondary | ICD-10-CM | POA: Diagnosis not present

## 2014-07-05 DIAGNOSIS — Z7901 Long term (current) use of anticoagulants: Secondary | ICD-10-CM | POA: Diagnosis not present

## 2014-07-05 DIAGNOSIS — I480 Paroxysmal atrial fibrillation: Secondary | ICD-10-CM | POA: Diagnosis not present

## 2014-07-05 DIAGNOSIS — Z9181 History of falling: Secondary | ICD-10-CM | POA: Diagnosis not present

## 2014-07-05 DIAGNOSIS — E039 Hypothyroidism, unspecified: Secondary | ICD-10-CM | POA: Diagnosis not present

## 2014-07-10 DIAGNOSIS — I481 Persistent atrial fibrillation: Secondary | ICD-10-CM | POA: Diagnosis not present

## 2014-07-12 DIAGNOSIS — I10 Essential (primary) hypertension: Secondary | ICD-10-CM | POA: Diagnosis not present

## 2014-07-12 DIAGNOSIS — I480 Paroxysmal atrial fibrillation: Secondary | ICD-10-CM | POA: Diagnosis not present

## 2014-07-12 DIAGNOSIS — Z9181 History of falling: Secondary | ICD-10-CM | POA: Diagnosis not present

## 2014-07-12 DIAGNOSIS — Z7901 Long term (current) use of anticoagulants: Secondary | ICD-10-CM | POA: Diagnosis not present

## 2014-07-12 DIAGNOSIS — E039 Hypothyroidism, unspecified: Secondary | ICD-10-CM | POA: Diagnosis not present

## 2014-08-01 DIAGNOSIS — I480 Paroxysmal atrial fibrillation: Secondary | ICD-10-CM | POA: Diagnosis not present

## 2014-08-01 DIAGNOSIS — Z7901 Long term (current) use of anticoagulants: Secondary | ICD-10-CM | POA: Diagnosis not present

## 2014-08-01 DIAGNOSIS — E039 Hypothyroidism, unspecified: Secondary | ICD-10-CM | POA: Diagnosis not present

## 2014-08-01 DIAGNOSIS — Z9181 History of falling: Secondary | ICD-10-CM | POA: Diagnosis not present

## 2014-08-01 DIAGNOSIS — I10 Essential (primary) hypertension: Secondary | ICD-10-CM | POA: Diagnosis not present

## 2014-08-15 DIAGNOSIS — N183 Chronic kidney disease, stage 3 (moderate): Secondary | ICD-10-CM | POA: Diagnosis not present

## 2014-08-15 DIAGNOSIS — R296 Repeated falls: Secondary | ICD-10-CM | POA: Diagnosis not present

## 2014-08-15 DIAGNOSIS — Z23 Encounter for immunization: Secondary | ICD-10-CM | POA: Diagnosis not present

## 2014-08-15 DIAGNOSIS — I4891 Unspecified atrial fibrillation: Secondary | ICD-10-CM | POA: Diagnosis not present

## 2014-08-15 DIAGNOSIS — R0609 Other forms of dyspnea: Secondary | ICD-10-CM | POA: Diagnosis not present

## 2014-08-15 DIAGNOSIS — E039 Hypothyroidism, unspecified: Secondary | ICD-10-CM | POA: Diagnosis not present

## 2014-08-16 DIAGNOSIS — I351 Nonrheumatic aortic (valve) insufficiency: Secondary | ICD-10-CM | POA: Diagnosis not present

## 2014-08-16 DIAGNOSIS — I714 Abdominal aortic aneurysm, without rupture: Secondary | ICD-10-CM | POA: Diagnosis not present

## 2014-08-16 DIAGNOSIS — I481 Persistent atrial fibrillation: Secondary | ICD-10-CM | POA: Diagnosis not present

## 2014-08-16 DIAGNOSIS — R0602 Shortness of breath: Secondary | ICD-10-CM | POA: Diagnosis not present

## 2014-08-20 DIAGNOSIS — E039 Hypothyroidism, unspecified: Secondary | ICD-10-CM | POA: Diagnosis not present

## 2014-08-20 DIAGNOSIS — I10 Essential (primary) hypertension: Secondary | ICD-10-CM | POA: Diagnosis not present

## 2014-08-20 DIAGNOSIS — Z7901 Long term (current) use of anticoagulants: Secondary | ICD-10-CM | POA: Diagnosis not present

## 2014-08-20 DIAGNOSIS — Z9181 History of falling: Secondary | ICD-10-CM | POA: Diagnosis not present

## 2014-08-20 DIAGNOSIS — I480 Paroxysmal atrial fibrillation: Secondary | ICD-10-CM | POA: Diagnosis not present

## 2014-09-11 DIAGNOSIS — F339 Major depressive disorder, recurrent, unspecified: Secondary | ICD-10-CM | POA: Diagnosis not present

## 2014-09-11 DIAGNOSIS — F411 Generalized anxiety disorder: Secondary | ICD-10-CM | POA: Diagnosis not present

## 2014-09-11 DIAGNOSIS — F39 Unspecified mood [affective] disorder: Secondary | ICD-10-CM | POA: Diagnosis not present

## 2014-09-11 DIAGNOSIS — F332 Major depressive disorder, recurrent severe without psychotic features: Secondary | ICD-10-CM | POA: Diagnosis not present

## 2014-09-16 DIAGNOSIS — Z79899 Other long term (current) drug therapy: Secondary | ICD-10-CM | POA: Diagnosis not present

## 2014-09-16 DIAGNOSIS — I350 Nonrheumatic aortic (valve) stenosis: Secondary | ICD-10-CM | POA: Diagnosis not present

## 2014-09-16 DIAGNOSIS — S81809A Unspecified open wound, unspecified lower leg, initial encounter: Secondary | ICD-10-CM | POA: Diagnosis not present

## 2014-09-16 DIAGNOSIS — I10 Essential (primary) hypertension: Secondary | ICD-10-CM | POA: Diagnosis not present

## 2014-09-16 DIAGNOSIS — E039 Hypothyroidism, unspecified: Secondary | ICD-10-CM | POA: Diagnosis not present

## 2014-09-16 LAB — BASIC METABOLIC PANEL
CREATININE: 1.9 mg/dL — AB (ref 0.6–1.3)
GLUCOSE: 108 mg/dL
Potassium: 4.1 mmol/L (ref 3.4–5.3)
SODIUM: 137 mmol/L (ref 137–147)

## 2014-09-18 DIAGNOSIS — Z7901 Long term (current) use of anticoagulants: Secondary | ICD-10-CM | POA: Diagnosis not present

## 2014-09-18 DIAGNOSIS — N400 Enlarged prostate without lower urinary tract symptoms: Secondary | ICD-10-CM | POA: Diagnosis present

## 2014-09-18 DIAGNOSIS — I10 Essential (primary) hypertension: Secondary | ICD-10-CM | POA: Diagnosis not present

## 2014-09-18 DIAGNOSIS — R1312 Dysphagia, oropharyngeal phase: Secondary | ICD-10-CM | POA: Diagnosis not present

## 2014-09-18 DIAGNOSIS — I2510 Atherosclerotic heart disease of native coronary artery without angina pectoris: Secondary | ICD-10-CM | POA: Diagnosis not present

## 2014-09-18 DIAGNOSIS — Z87891 Personal history of nicotine dependence: Secondary | ICD-10-CM | POA: Diagnosis not present

## 2014-09-18 DIAGNOSIS — I4891 Unspecified atrial fibrillation: Secondary | ICD-10-CM | POA: Diagnosis present

## 2014-09-18 DIAGNOSIS — R9089 Other abnormal findings on diagnostic imaging of central nervous system: Secondary | ICD-10-CM | POA: Diagnosis not present

## 2014-09-18 DIAGNOSIS — Z7982 Long term (current) use of aspirin: Secondary | ICD-10-CM | POA: Diagnosis not present

## 2014-09-18 DIAGNOSIS — I482 Chronic atrial fibrillation: Secondary | ICD-10-CM | POA: Diagnosis not present

## 2014-09-18 DIAGNOSIS — R278 Other lack of coordination: Secondary | ICD-10-CM | POA: Diagnosis not present

## 2014-09-18 DIAGNOSIS — R918 Other nonspecific abnormal finding of lung field: Secondary | ICD-10-CM | POA: Diagnosis not present

## 2014-09-18 DIAGNOSIS — I2789 Other specified pulmonary heart diseases: Secondary | ICD-10-CM | POA: Diagnosis not present

## 2014-09-18 DIAGNOSIS — R55 Syncope and collapse: Secondary | ICD-10-CM | POA: Diagnosis not present

## 2014-09-18 DIAGNOSIS — R296 Repeated falls: Secondary | ICD-10-CM | POA: Diagnosis not present

## 2014-09-18 DIAGNOSIS — E039 Hypothyroidism, unspecified: Secondary | ICD-10-CM | POA: Diagnosis present

## 2014-09-18 DIAGNOSIS — F0390 Unspecified dementia without behavioral disturbance: Secondary | ICD-10-CM | POA: Diagnosis present

## 2014-09-18 DIAGNOSIS — I351 Nonrheumatic aortic (valve) insufficiency: Secondary | ICD-10-CM | POA: Diagnosis not present

## 2014-09-18 DIAGNOSIS — I951 Orthostatic hypotension: Secondary | ICD-10-CM | POA: Diagnosis present

## 2014-09-18 DIAGNOSIS — I5033 Acute on chronic diastolic (congestive) heart failure: Secondary | ICD-10-CM | POA: Diagnosis present

## 2014-09-18 DIAGNOSIS — Z9181 History of falling: Secondary | ICD-10-CM | POA: Diagnosis not present

## 2014-09-18 DIAGNOSIS — F329 Major depressive disorder, single episode, unspecified: Secondary | ICD-10-CM | POA: Diagnosis present

## 2014-09-18 DIAGNOSIS — I714 Abdominal aortic aneurysm, without rupture: Secondary | ICD-10-CM | POA: Diagnosis not present

## 2014-09-18 DIAGNOSIS — F419 Anxiety disorder, unspecified: Secondary | ICD-10-CM | POA: Diagnosis present

## 2014-09-18 DIAGNOSIS — I481 Persistent atrial fibrillation: Secondary | ICD-10-CM | POA: Diagnosis not present

## 2014-09-18 DIAGNOSIS — Z01818 Encounter for other preprocedural examination: Secondary | ICD-10-CM | POA: Diagnosis not present

## 2014-09-18 DIAGNOSIS — Z87898 Personal history of other specified conditions: Secondary | ICD-10-CM | POA: Diagnosis not present

## 2014-09-18 DIAGNOSIS — G4700 Insomnia, unspecified: Secondary | ICD-10-CM | POA: Diagnosis present

## 2014-09-18 DIAGNOSIS — R2689 Other abnormalities of gait and mobility: Secondary | ICD-10-CM | POA: Diagnosis not present

## 2014-09-18 DIAGNOSIS — G3184 Mild cognitive impairment, so stated: Secondary | ICD-10-CM | POA: Diagnosis not present

## 2014-09-18 DIAGNOSIS — M6281 Muscle weakness (generalized): Secondary | ICD-10-CM | POA: Diagnosis not present

## 2014-09-18 DIAGNOSIS — N189 Chronic kidney disease, unspecified: Secondary | ICD-10-CM | POA: Diagnosis not present

## 2014-09-18 DIAGNOSIS — Q253 Supravalvular aortic stenosis: Secondary | ICD-10-CM | POA: Diagnosis not present

## 2014-09-18 DIAGNOSIS — I129 Hypertensive chronic kidney disease with stage 1 through stage 4 chronic kidney disease, or unspecified chronic kidney disease: Secondary | ICD-10-CM | POA: Diagnosis present

## 2014-09-18 DIAGNOSIS — R42 Dizziness and giddiness: Secondary | ICD-10-CM | POA: Diagnosis not present

## 2014-09-18 DIAGNOSIS — E785 Hyperlipidemia, unspecified: Secondary | ICD-10-CM | POA: Diagnosis present

## 2014-09-20 LAB — PULMONARY FUNCTION TEST

## 2014-09-23 DIAGNOSIS — E038 Other specified hypothyroidism: Secondary | ICD-10-CM | POA: Diagnosis not present

## 2014-09-23 DIAGNOSIS — R296 Repeated falls: Secondary | ICD-10-CM | POA: Diagnosis not present

## 2014-09-23 DIAGNOSIS — Z7982 Long term (current) use of aspirin: Secondary | ICD-10-CM | POA: Diagnosis not present

## 2014-09-23 DIAGNOSIS — Z7901 Long term (current) use of anticoagulants: Secondary | ICD-10-CM | POA: Diagnosis not present

## 2014-09-23 DIAGNOSIS — R1312 Dysphagia, oropharyngeal phase: Secondary | ICD-10-CM | POA: Diagnosis not present

## 2014-09-23 DIAGNOSIS — F329 Major depressive disorder, single episode, unspecified: Secondary | ICD-10-CM | POA: Diagnosis not present

## 2014-09-23 DIAGNOSIS — I10 Essential (primary) hypertension: Secondary | ICD-10-CM | POA: Diagnosis not present

## 2014-09-23 DIAGNOSIS — I25119 Atherosclerotic heart disease of native coronary artery with unspecified angina pectoris: Secondary | ICD-10-CM | POA: Diagnosis not present

## 2014-09-23 DIAGNOSIS — E780 Pure hypercholesterolemia: Secondary | ICD-10-CM | POA: Diagnosis not present

## 2014-09-23 DIAGNOSIS — I2510 Atherosclerotic heart disease of native coronary artery without angina pectoris: Secondary | ICD-10-CM | POA: Diagnosis not present

## 2014-09-23 DIAGNOSIS — M6281 Muscle weakness (generalized): Secondary | ICD-10-CM | POA: Diagnosis not present

## 2014-09-23 DIAGNOSIS — G3184 Mild cognitive impairment, so stated: Secondary | ICD-10-CM | POA: Diagnosis not present

## 2014-09-23 DIAGNOSIS — I482 Chronic atrial fibrillation: Secondary | ICD-10-CM | POA: Diagnosis not present

## 2014-09-23 DIAGNOSIS — E039 Hypothyroidism, unspecified: Secondary | ICD-10-CM | POA: Diagnosis not present

## 2014-09-23 DIAGNOSIS — R55 Syncope and collapse: Secondary | ICD-10-CM | POA: Diagnosis not present

## 2014-09-23 DIAGNOSIS — I351 Nonrheumatic aortic (valve) insufficiency: Secondary | ICD-10-CM | POA: Diagnosis not present

## 2014-09-23 DIAGNOSIS — R278 Other lack of coordination: Secondary | ICD-10-CM | POA: Diagnosis not present

## 2014-09-23 DIAGNOSIS — E785 Hyperlipidemia, unspecified: Secondary | ICD-10-CM | POA: Diagnosis not present

## 2014-09-23 DIAGNOSIS — R42 Dizziness and giddiness: Secondary | ICD-10-CM | POA: Diagnosis not present

## 2014-09-23 DIAGNOSIS — Z9181 History of falling: Secondary | ICD-10-CM | POA: Diagnosis not present

## 2014-09-27 ENCOUNTER — Non-Acute Institutional Stay (SKILLED_NURSING_FACILITY): Payer: Medicare Other | Admitting: Internal Medicine

## 2014-09-27 DIAGNOSIS — E038 Other specified hypothyroidism: Secondary | ICD-10-CM | POA: Diagnosis not present

## 2014-09-27 DIAGNOSIS — R296 Repeated falls: Secondary | ICD-10-CM

## 2014-09-27 DIAGNOSIS — F32A Depression, unspecified: Secondary | ICD-10-CM

## 2014-09-27 DIAGNOSIS — I10 Essential (primary) hypertension: Secondary | ICD-10-CM

## 2014-09-27 DIAGNOSIS — E7800 Pure hypercholesterolemia, unspecified: Secondary | ICD-10-CM

## 2014-09-27 DIAGNOSIS — I351 Nonrheumatic aortic (valve) insufficiency: Secondary | ICD-10-CM | POA: Diagnosis not present

## 2014-09-27 DIAGNOSIS — F329 Major depressive disorder, single episode, unspecified: Secondary | ICD-10-CM | POA: Diagnosis not present

## 2014-09-27 DIAGNOSIS — I25119 Atherosclerotic heart disease of native coronary artery with unspecified angina pectoris: Secondary | ICD-10-CM | POA: Diagnosis not present

## 2014-09-27 DIAGNOSIS — I482 Chronic atrial fibrillation, unspecified: Secondary | ICD-10-CM

## 2014-09-27 DIAGNOSIS — E034 Atrophy of thyroid (acquired): Secondary | ICD-10-CM

## 2014-09-27 NOTE — Progress Notes (Signed)
MRN: [id number] Name: [name] [name]  Sex: male Age: 81 y.o. DOB: 03/23/1932  [facility] Facility/Room:112 Level Of Care: SNF Provider: Merrilee Seashore D Emergency Contacts: Extended Emergency Contact Information Primary Emergency Contact: Caryl Comes of [place] Home Phone: [phone] Relation: Son Secondary Emergency Contact: Riling,[name] Address: [address], [place] Home Phone: [phone] Relation: Other  Code Status:FULL   Allergies: Review of patient's allergies indicates no known allergies.  Chief Complaint  Patient presents with  . New Admit To SNF    HPI: Patient is 81 y.o. male who is admitted to SNF for generalized weaknes for OT/PT while waiting re-eval at [facility] for CABG and SVR in 1 month.  Past Medical History  Diagnosis Date  . Atrial fibrillation   . OSA (obstructive sleep apnea)   . Hypothyroidism   . Plantar fasciitis   . Seasonal allergies   . Decreased testosterone level   . H/O ventral hernia   . emphysema   . Varicose veins   . Hypertension   . Hypercholesteremia   . Aortic valve insufficiency   . Depression     Past Surgical History  Procedure Laterality Date  . Hernia repair    . Cholecystectomy    . Abdominal aortic aneurysm repair      X2      Medication List       This list is accurate as of: 09/27/14 11:59 PM.  Always use your most recent med list.               albuterol 108 (90 BASE) MCG/ACT inhaler  Commonly known as:  PROVENTIL HFA;VENTOLIN HFA  Inhale 2 puffs into the lungs every 6 (six) hours as needed for wheezing or shortness of breath.     apixaban 2.5 MG Tabs tablet  Commonly known as:  ELIQUIS  Take 1 tablet (2.5 mg total) by mouth 2 (two) times daily.     aspirin 81 MG tablet  Take 81 mg by mouth daily.     atorvastatin 10 MG tablet  Commonly known as:  LIPITOR  Take 10 mg by mouth daily at 6 PM.     b complex vitamins tablet  Take 1 tablet by  mouth daily.     buPROPion 300 MG 24 hr tablet  Commonly known as:  WELLBUTRIN XL  Take 300 mg by mouth daily.     busPIRone 15 MG tablet  Commonly known as:  BUSPAR  Take 15 mg by mouth 2 (two) times daily.     cholecalciferol 1000 UNITS tablet  Commonly known as:  VITAMIN D  Take 2,000 Units by mouth daily.     clonazePAM 0.5 MG tablet  Commonly known as:  KLONOPIN  Take 0.5 mg by mouth at bedtime as needed for anxiety.     digoxin 0.125 MG tablet  Commonly known as:  LANOXIN  Take 0.125 mg by mouth daily.     diltiazem 240 MG 24 hr capsule  Commonly known as:  CARDIZEM CD  Take 1 capsule (240 mg total) by mouth daily.     dutasteride 0.5 MG capsule  Commonly known as:  AVODART  Take 0.5 mg by mouth daily.     escitalopram 20 MG tablet  Commonly known as:  LEXAPRO  Take 20 mg by mouth daily.     eszopiclone 3 MG Tabs  Generic drug:  Eszopiclone  Take 3 mg by mouth at  bedtime. Take immediately before bedtime     fluticasone 50 MCG/ACT nasal spray  Commonly known as:  FLONASE  Place 1 spray into both nostrils daily.     folic acid 1 MG tablet  Commonly known as:  FOLVITE  Take 1 mg by mouth daily.     furosemide 40 MG tablet  Commonly known as:  LASIX  Take 40 mg by mouth.     lamoTRIgine 100 MG tablet  Commonly known as:  LAMICTAL  Take 100 mg by mouth 2 (two) times daily. Breakfast and dinner     levothyroxine 150 MCG tablet  Commonly known as:  SYNTHROID, LEVOTHROID  Take 150 mcg by mouth daily before breakfast.     lisinopril 40 MG tablet  Commonly known as:  PRINIVIL,ZESTRIL  Take 40 mg by mouth daily.     Melatonin 3 MG Tabs  Take 1 tablet by mouth at bedtime.     naproxen sodium 220 MG tablet  Commonly known as:  ANAPROX  Take 220 mg by mouth 2 (two) times daily with a meal.     omega-3 acid ethyl esters 1 G capsule  Commonly known as:  LOVAZA  Take 2 g by mouth 2 (two) times daily.        Meds ordered this encounter  Medications  .  atorvastatin (LIPITOR) 10 MG tablet    Sig: Take 10 mg by mouth daily at 6 PM.  . Melatonin 3 MG TABS    Sig: Take 1 tablet by mouth at bedtime.  Marland Kitchen lisinopril (PRINIVIL,ZESTRIL) 40 MG tablet    Sig: Take 40 mg by mouth daily.  . furosemide (LASIX) 40 MG tablet    Sig: Take 40 mg by mouth.  Marland Kitchen b complex vitamins tablet    Sig: Take 1 tablet by mouth daily.  Marland Kitchen albuterol (PROVENTIL HFA;VENTOLIN HFA) 108 (90 BASE) MCG/ACT inhaler    Sig: Inhale 2 puffs into the lungs every 6 (six) hours as needed for wheezing or shortness of breath.  Marland Kitchen aspirin 81 MG tablet    Sig: Take 81 mg by mouth daily.  . naproxen sodium (ANAPROX) 220 MG tablet    Sig: Take 220 mg by mouth 2 (two) times daily with a meal.  . digoxin (LANOXIN) 0.125 MG tablet    Sig: Take 0.125 mg by mouth daily.  Marland Kitchen lamoTRIgine (LAMICTAL) 100 MG tablet    Sig: Take 100 mg by mouth 2 (two) times daily. Breakfast and dinner    Immunization History  Administered Date(s) Administered  . Influenza, High Dose Seasonal PF 05/29/2013  . Pneumococcal Polysaccharide-23 04/29/2010    History  Substance Use Topics  . Smoking status: Former Smoker -- 2.00 packs/day for 22 years    Types: Cigarettes    Quit date: 04/29/1974  . Smokeless tobacco: Never Used  . Alcohol Use: No    Family history is noncontributory    Review of Systems  DATA OBTAINED: from patient, medical record GENERAL:  no fevers, fatigue, appetite changes SKIN: No itching, rash or wounds EYES: No eye pain, redness, discharge EARS: No earache, tinnitus, change in hearing NOSE: No congestion, drainage or bleeding  MOUTH/THROAT: No mouth or tooth pain, No sore throat RESPIRATORY: No cough, wheezing, SOB CARDIAC: No chest pain, palpitations, lower extremity edema  GI: No abdominal pain, No N/V/D or constipation, No heartburn or reflux  GU: No dysuria, frequency or urgency, or incontinence  MUSCULOSKELETAL: No unrelieved bone/joint pain NEUROLOGIC: No headache,  dizziness or focal  weakness PSYCHIATRIC: No overt anxiety or sadness, No behavior issue.   Filed Vitals:   09/27/14 1912  BP: 130/70  Pulse: 60  Temp: 96.7 F (35.9 C)  Resp: 20    Physical Exam  GENERAL APPEARANCE: Alert, conversant,  No acute distress.  SKIN: No diaphoresis rash HEAD: Normocephalic, atraumatic  EYES: Conjunctiva/lids clear. Pupils round, reactive. EOMs intact.  EARS: External exam WNL, canals clear. Hearing grossly normal.  NOSE: No deformity or discharge.  MOUTH/THROAT: Lips w/o lesions  RESPIRATORY: Breathing is even, unlabored. Lung sounds are clear   CARDIOVASCULAR: Heart irreg with 2/6 systolic murmurs,no rubs or gallops. No peripheral edema.   GASTROINTESTINAL: Abdomen is soft, non-tender, not distended w/ normal bowel sounds. GENITOURINARY: Bladder non tender, not distended  MUSCULOSKELETAL: No abnormal joints or musculature NEUROLOGIC:  Cranial nerves 2-12 grossly intact. Moves all extremities  PSYCHIATRIC: Mood and affect appropriate to situation, no behavioral issues  Patient Active Problem List   Diagnosis Date Noted  . CAD (coronary artery disease), native coronary artery 09/29/2014  . Falls frequently 09/29/2014  . Hypertension   . Hypercholesteremia   . Aortic valve insufficiency   . Depression   . Atrial fibrillation, chronic 06/20/2014  . Hypothyroidism 06/20/2014  . DOE (dyspnea on exertion) 06/20/2014  . Cough 04/29/2014  . Shortness of breath 04/29/2014    CBC    Component Value Date/Time   WBC 9.3 06/22/2014 0251   RBC 4.06* 06/22/2014 0251   HGB 12.7* 06/22/2014 0251   HCT 37.7* 06/22/2014 0251   PLT 196 06/22/2014 0251   MCV 92.9 06/22/2014 0251   LYMPHSABS 1.5 06/20/2014 1142   MONOABS 0.7 06/20/2014 1142   EOSABS 0.1 06/20/2014 1142   BASOSABS 0.0 06/20/2014 1142    CMP     Component Value Date/Time   NA 139 06/22/2014 0251   K 4.4 06/22/2014 0251   CL 103 06/22/2014 0251   CO2 21 06/22/2014 0251   GLUCOSE  115* 06/22/2014 0251   BUN 20 06/22/2014 0251   CREATININE 1.54* 06/22/2014 0251   CALCIUM 9.0 06/22/2014 0251   PROT 7.4 06/20/2014 1142   ALBUMIN 3.4* 06/20/2014 1142   AST 21 06/20/2014 1142   ALT 18 06/20/2014 1142   ALKPHOS 91 06/20/2014 1142   BILITOT 0.5 06/20/2014 1142   GFRNONAA 41* 06/22/2014 0251   GFRAA 47* 06/22/2014 0251    Assessment and Plan  Aortic valve insufficiency Severe AI presenting with increasing DOE;plan is SNF then reeval 1 month re valve repair   CAD (coronary artery disease), native coronary artery Plan is CABG in a month;until then on ASA, diltazem, lisinopril and lipitor   Atrial fibrillation, chronic Rate controlled dilt and dig; eliquis for prophylaxis   Hypothyroidism Cont 150 mcg for normal T4 in 05/2014   Falls frequently Neuro finds no problem;felt to be from weakness;admit SNF for OT/PT   Hypercholesteremia Lipitor 10 mg and lovaza 1000mg  daily   Hypertension Cardizem, lisnopril and Lasix for control   Depression Pt d/c from [facility] on lexapro, Wellbutrin and lamictal;plan cont all;pt will be back at [facility] in a month     Margit HanksALEXANDER, Ulysess Witz D, MD

## 2014-09-29 ENCOUNTER — Encounter: Payer: Self-pay | Admitting: Internal Medicine

## 2014-09-29 DIAGNOSIS — R296 Repeated falls: Secondary | ICD-10-CM | POA: Insufficient documentation

## 2014-09-29 DIAGNOSIS — I2510 Atherosclerotic heart disease of native coronary artery without angina pectoris: Secondary | ICD-10-CM | POA: Insufficient documentation

## 2014-09-29 DIAGNOSIS — I351 Nonrheumatic aortic (valve) insufficiency: Secondary | ICD-10-CM | POA: Insufficient documentation

## 2014-09-29 DIAGNOSIS — E785 Hyperlipidemia, unspecified: Secondary | ICD-10-CM | POA: Insufficient documentation

## 2014-09-29 DIAGNOSIS — I10 Essential (primary) hypertension: Secondary | ICD-10-CM | POA: Insufficient documentation

## 2014-09-29 DIAGNOSIS — F32A Depression, unspecified: Secondary | ICD-10-CM | POA: Insufficient documentation

## 2014-09-29 DIAGNOSIS — F329 Major depressive disorder, single episode, unspecified: Secondary | ICD-10-CM | POA: Insufficient documentation

## 2014-09-29 NOTE — Assessment & Plan Note (Signed)
Cardizem, lisnopril and Lasix for control

## 2014-09-29 NOTE — Assessment & Plan Note (Signed)
Lipitor 10 mg and lovaza 1000mg  daily

## 2014-09-29 NOTE — Assessment & Plan Note (Signed)
Neuro finds no problem;felt to be from weakness;admit SNF for OT/PT

## 2014-09-29 NOTE — Assessment & Plan Note (Signed)
Cont 150 mcg for normal T4 in 05/2014

## 2014-09-29 NOTE — Assessment & Plan Note (Signed)
Pt d/c from [facility] on lexapro, Wellbutrin and lamictal;plan cont all;[facility]

## 2014-09-29 NOTE — Assessment & Plan Note (Signed)
Plan is CABG in a month;until then on ASA, diltazem, lisinopril and lipitor

## 2014-09-29 NOTE — Assessment & Plan Note (Signed)
Severe AI presenting with increasing DOE;plan is SNF then reeval 1 month re valve repair

## 2014-09-29 NOTE — Assessment & Plan Note (Signed)
Rate controlled dilt and dig; eliquis for prophylaxis

## 2014-10-07 ENCOUNTER — Non-Acute Institutional Stay (SKILLED_NURSING_FACILITY): Payer: Medicare Other | Admitting: Internal Medicine

## 2014-10-07 ENCOUNTER — Encounter: Payer: Self-pay | Admitting: Internal Medicine

## 2014-10-07 DIAGNOSIS — E7800 Pure hypercholesterolemia, unspecified: Secondary | ICD-10-CM

## 2014-10-07 DIAGNOSIS — R296 Repeated falls: Secondary | ICD-10-CM | POA: Diagnosis not present

## 2014-10-07 DIAGNOSIS — E038 Other specified hypothyroidism: Secondary | ICD-10-CM

## 2014-10-07 DIAGNOSIS — I10 Essential (primary) hypertension: Secondary | ICD-10-CM | POA: Diagnosis not present

## 2014-10-07 DIAGNOSIS — I482 Chronic atrial fibrillation, unspecified: Secondary | ICD-10-CM

## 2014-10-07 DIAGNOSIS — I25119 Atherosclerotic heart disease of native coronary artery with unspecified angina pectoris: Secondary | ICD-10-CM | POA: Diagnosis not present

## 2014-10-07 DIAGNOSIS — I351 Nonrheumatic aortic (valve) insufficiency: Secondary | ICD-10-CM | POA: Diagnosis not present

## 2014-10-07 DIAGNOSIS — F329 Major depressive disorder, single episode, unspecified: Secondary | ICD-10-CM

## 2014-10-07 DIAGNOSIS — F32A Depression, unspecified: Secondary | ICD-10-CM

## 2014-10-07 DIAGNOSIS — E034 Atrophy of thyroid (acquired): Secondary | ICD-10-CM

## 2014-10-07 NOTE — Progress Notes (Signed)
MRN: [id number] Name: [name] [name]  Sex: male Age: 81 y.o. DOB: 1931-10-24  [facility] Facility/Room: 112 Level Of Care: SNF Provider: Merrilee SeashoreALEXANDER, ANNE D Emergency Contacts: Extended Emergency Contact Information Primary Emergency Contact: Caryl ComesAshby,[name]  [place] Home Phone: [phone] Relation: Son Secondary Emergency Contact: Youngblood,[name] Address: [address]          [place], [place] Home Phone: [phone] Relation: Other  Code Status: FULL  Allergies: Review of patient's allergies indicates no known allergies.  Chief Complaint  Patient presents with  . Discharge Note    HPI: Patient is 81 y.o. male who was hospitalized and [facility] for AVI and CP then admitted to SNF for generalized weakness for OT/PT while [facility] whether pt can survive a CABG and AVR, who is stable to be d/c to home.  Past Medical History  Diagnosis Date  . Atrial fibrillation   . OSA (obstructive sleep apnea)   . Hypothyroidism   . Plantar fasciitis   . Seasonal allergies   . Decreased testosterone level   . H/O ventral hernia   . emphysema   . Varicose veins   . Hypertension   . Hypercholesteremia   . Aortic valve insufficiency   . Depression     Past Surgical History  Procedure Laterality Date  . Hernia repair    . Cholecystectomy    . Abdominal aortic aneurysm repair      X2      Medication List       This list is accurate as of: 10/07/14 12:55 PM.  Always use your most recent med list.               albuterol 108 (90 BASE) MCG/ACT inhaler  Commonly known as:  PROVENTIL HFA;VENTOLIN HFA  Inhale 2 puffs into the lungs every 6 (six) hours as needed for wheezing or shortness of breath.     apixaban 2.5 MG Tabs tablet  Commonly known as:  ELIQUIS  Take 1 tablet (2.5 mg total) by mouth 2 (two) times daily.     aspirin 81 MG tablet  Take 81 mg by mouth daily.     atorvastatin 10 MG tablet  Commonly known as:  LIPITOR   Take 10 mg by mouth daily at 6 PM.     b complex vitamins tablet  Take 1 tablet by mouth daily.     buPROPion 300 MG 24 hr tablet  Commonly known as:  WELLBUTRIN XL  Take 300 mg by mouth daily.     busPIRone 15 MG tablet  Commonly known as:  BUSPAR  Take 15 mg by mouth 2 (two) times daily.     calcium carbonate 500 MG chewable tablet  Commonly known as:  TUMS - dosed in mg elemental calcium  Chew 1 tablet by mouth as needed for indigestion or heartburn.     cholecalciferol 1000 UNITS tablet  Commonly known as:  VITAMIN D  Take 2,000 Units by mouth daily.     clonazePAM 0.5 MG tablet  Commonly known as:  KLONOPIN  Take 0.5 mg by mouth at bedtime as needed for anxiety.     digoxin 0.125 MG tablet  Commonly known as:  LANOXIN  Take 0.125 mg by mouth daily.     diltiazem 120 MG 24 hr capsule  Commonly known as:  TIAZAC  Take 120 mg by mouth daily.     dutasteride 0.5 MG capsule  Commonly known as:  AVODART  Take 0.5 mg by mouth daily.     escitalopram 20 MG tablet  Commonly known as:  LEXAPRO  Take 20 mg by mouth daily.     eszopiclone 3 MG Tabs  Generic drug:  Eszopiclone  Take 3 mg by mouth at bedtime. Take immediately before bedtime     fluticasone 50 MCG/ACT nasal spray  Commonly known as:  FLONASE  Place 1 spray into both nostrils daily.     folic acid 1 MG tablet  Commonly known as:  FOLVITE  Take 1 mg by mouth daily.     furosemide 40 MG tablet  Commonly known as:  LASIX  Take 40 mg by mouth.     lamoTRIgine 100 MG tablet  Commonly known as:  LAMICTAL  Take 100 mg by mouth 2 (two) times daily. Breakfast and dinner     levothyroxine 150 MCG tablet  Commonly known as:  SYNTHROID, LEVOTHROID  Take 150 mcg by mouth daily before breakfast.     lisinopril 40 MG tablet  Commonly known as:  PRINIVIL,ZESTRIL  Take 40 mg by mouth daily.     Melatonin 3 MG Tabs  Take 1 tablet by mouth at bedtime.     omega-3 acid ethyl esters 1 G capsule  Commonly  known as:  LOVAZA  Take 2 g by mouth 2 (two) times daily.        Meds ordered this encounter  Medications  . calcium carbonate (TUMS - DOSED IN MG ELEMENTAL CALCIUM) 500 MG chewable tablet    Sig: Chew 1 tablet by mouth as needed for indigestion or heartburn.  . diltiazem (TIAZAC) 120 MG 24 hr capsule    Sig: Take 120 mg by mouth daily.    Immunization History  Administered Date(s) Administered  . Influenza, High Dose Seasonal PF 05/29/2013  . Pneumococcal Polysaccharide-23 04/29/2010    History  Substance Use Topics  . Smoking status: Former Smoker -- 2.00 packs/day for 22 years    Types: Cigarettes    Quit date: 04/29/1974  . Smokeless tobacco: Never Used  . Alcohol Use: No    There were no vitals filed for this visit.  Physical Exam  GENERAL APPEARANCE: Alert, conversant. No acute distress.  HEENT: Unremarkable. RESPIRATORY: Breathing is even, unlabored. Lung sounds are clear   CARDIOVASCULAR: Heart irreg with 2/6 systolic murmur,no  rubs or gallops. No peripheral edema.  GASTROINTESTINAL: Abdomen is soft, non-tender, not distended w/ normal bowel sounds.  NEUROLOGIC: Cranial nerves 2-12 grossly intact. Moves all extremities  Patient Active Problem List   Diagnosis Date Noted  . CAD (coronary artery disease), native coronary artery 09/29/2014  . Falls frequently 09/29/2014  . Hypertension   . Hypercholesteremia   . Aortic valve insufficiency   . Depression   . Atrial fibrillation, chronic 06/20/2014  . Hypothyroidism 06/20/2014  . DOE (dyspnea on exertion) 06/20/2014  . Cough 04/29/2014  . Shortness of breath 04/29/2014    CBC    Component Value Date/Time   WBC 9.3 06/22/2014 0251   RBC 4.06* 06/22/2014 0251   HGB 12.7* 06/22/2014 0251   HCT 37.7* 06/22/2014 0251   PLT 196 06/22/2014 0251   MCV 92.9 06/22/2014 0251   LYMPHSABS 1.5 06/20/2014 1142   MONOABS 0.7 06/20/2014 1142   EOSABS 0.1 06/20/2014 1142   BASOSABS 0.0 06/20/2014 1142    CMP      Component Value Date/Time   NA 139 06/22/2014 0251   K 4.4 06/22/2014 0251   CL 103 06/22/2014  0251   CO2 21 06/22/2014 0251   GLUCOSE 115* 06/22/2014 0251   BUN 20 06/22/2014 0251   CREATININE 1.54* 06/22/2014 0251   CALCIUM 9.0 06/22/2014 0251   PROT 7.4 06/20/2014 1142   ALBUMIN 3.4* 06/20/2014 1142   AST 21 06/20/2014 1142   ALT 18 06/20/2014 1142   ALKPHOS 91 06/20/2014 1142   BILITOT 0.5 06/20/2014 1142   GFRNONAA 41* 06/22/2014 0251   GFRAA 47* 06/22/2014 0251    Assessment and Plan  Pt is stable to be d/c to home with HH/OT/PT and rolling walker.  Margit Hanks, MD

## 2014-10-14 DIAGNOSIS — I4891 Unspecified atrial fibrillation: Secondary | ICD-10-CM | POA: Diagnosis not present

## 2014-10-14 DIAGNOSIS — I340 Nonrheumatic mitral (valve) insufficiency: Secondary | ICD-10-CM | POA: Diagnosis not present

## 2014-10-14 DIAGNOSIS — I10 Essential (primary) hypertension: Secondary | ICD-10-CM | POA: Diagnosis not present

## 2014-10-14 DIAGNOSIS — M6281 Muscle weakness (generalized): Secondary | ICD-10-CM | POA: Diagnosis not present

## 2014-10-14 DIAGNOSIS — Z9181 History of falling: Secondary | ICD-10-CM | POA: Diagnosis not present

## 2014-10-14 DIAGNOSIS — I2510 Atherosclerotic heart disease of native coronary artery without angina pectoris: Secondary | ICD-10-CM | POA: Diagnosis not present

## 2014-10-14 DIAGNOSIS — R269 Unspecified abnormalities of gait and mobility: Secondary | ICD-10-CM | POA: Diagnosis not present

## 2014-10-15 ENCOUNTER — Emergency Department (HOSPITAL_COMMUNITY): Payer: Medicare Other

## 2014-10-15 ENCOUNTER — Encounter (HOSPITAL_COMMUNITY): Payer: Self-pay | Admitting: *Deleted

## 2014-10-15 ENCOUNTER — Inpatient Hospital Stay (HOSPITAL_COMMUNITY)
Admission: EM | Admit: 2014-10-15 | Discharge: 2014-10-18 | DRG: 307 | Disposition: A | Discharge status: Skilled Nursing Facility | Payer: Medicare Other | Attending: Internal Medicine | Admitting: Internal Medicine

## 2014-10-15 ENCOUNTER — Inpatient Hospital Stay (HOSPITAL_COMMUNITY): Payer: Medicare Other

## 2014-10-15 DIAGNOSIS — M25571 Pain in right ankle and joints of right foot: Secondary | ICD-10-CM | POA: Diagnosis not present

## 2014-10-15 DIAGNOSIS — S82431A Displaced oblique fracture of shaft of right fibula, initial encounter for closed fracture: Secondary | ICD-10-CM | POA: Diagnosis not present

## 2014-10-15 DIAGNOSIS — M6281 Muscle weakness (generalized): Secondary | ICD-10-CM | POA: Diagnosis not present

## 2014-10-15 DIAGNOSIS — S0990XA Unspecified injury of head, initial encounter: Secondary | ICD-10-CM | POA: Diagnosis not present

## 2014-10-15 DIAGNOSIS — H5316 Psychophysical visual disturbances: Secondary | ICD-10-CM | POA: Diagnosis not present

## 2014-10-15 DIAGNOSIS — I2510 Atherosclerotic heart disease of native coronary artery without angina pectoris: Secondary | ICD-10-CM | POA: Diagnosis present

## 2014-10-15 DIAGNOSIS — S82831A Other fracture of upper and lower end of right fibula, initial encounter for closed fracture: Secondary | ICD-10-CM | POA: Diagnosis present

## 2014-10-15 DIAGNOSIS — Z7982 Long term (current) use of aspirin: Secondary | ICD-10-CM | POA: Diagnosis not present

## 2014-10-15 DIAGNOSIS — R0902 Hypoxemia: Secondary | ICD-10-CM | POA: Diagnosis present

## 2014-10-15 DIAGNOSIS — R011 Cardiac murmur, unspecified: Secondary | ICD-10-CM | POA: Diagnosis present

## 2014-10-15 DIAGNOSIS — S3993XA Unspecified injury of pelvis, initial encounter: Secondary | ICD-10-CM | POA: Diagnosis not present

## 2014-10-15 DIAGNOSIS — J449 Chronic obstructive pulmonary disease, unspecified: Secondary | ICD-10-CM | POA: Diagnosis present

## 2014-10-15 DIAGNOSIS — S82402A Unspecified fracture of shaft of left fibula, initial encounter for closed fracture: Secondary | ICD-10-CM | POA: Diagnosis not present

## 2014-10-15 DIAGNOSIS — S3992XA Unspecified injury of lower back, initial encounter: Secondary | ICD-10-CM | POA: Diagnosis not present

## 2014-10-15 DIAGNOSIS — R1312 Dysphagia, oropharyngeal phase: Secondary | ICD-10-CM | POA: Diagnosis not present

## 2014-10-15 DIAGNOSIS — I129 Hypertensive chronic kidney disease with stage 1 through stage 4 chronic kidney disease, or unspecified chronic kidney disease: Secondary | ICD-10-CM | POA: Diagnosis present

## 2014-10-15 DIAGNOSIS — N179 Acute kidney failure, unspecified: Secondary | ICD-10-CM | POA: Diagnosis present

## 2014-10-15 DIAGNOSIS — N189 Chronic kidney disease, unspecified: Secondary | ICD-10-CM | POA: Diagnosis present

## 2014-10-15 DIAGNOSIS — Z87891 Personal history of nicotine dependence: Secondary | ICD-10-CM

## 2014-10-15 DIAGNOSIS — M722 Plantar fascial fibromatosis: Secondary | ICD-10-CM | POA: Diagnosis present

## 2014-10-15 DIAGNOSIS — Z79899 Other long term (current) drug therapy: Secondary | ICD-10-CM

## 2014-10-15 DIAGNOSIS — W19XXXA Unspecified fall, initial encounter: Secondary | ICD-10-CM | POA: Diagnosis present

## 2014-10-15 DIAGNOSIS — S82401D Unspecified fracture of shaft of right fibula, subsequent encounter for closed fracture with routine healing: Secondary | ICD-10-CM | POA: Diagnosis not present

## 2014-10-15 DIAGNOSIS — Z86718 Personal history of other venous thrombosis and embolism: Secondary | ICD-10-CM

## 2014-10-15 DIAGNOSIS — G4733 Obstructive sleep apnea (adult) (pediatric): Secondary | ICD-10-CM | POA: Diagnosis present

## 2014-10-15 DIAGNOSIS — R441 Visual hallucinations: Secondary | ICD-10-CM | POA: Diagnosis not present

## 2014-10-15 DIAGNOSIS — R55 Syncope and collapse: Secondary | ICD-10-CM | POA: Diagnosis not present

## 2014-10-15 DIAGNOSIS — J302 Other seasonal allergic rhinitis: Secondary | ICD-10-CM | POA: Diagnosis present

## 2014-10-15 DIAGNOSIS — I482 Chronic atrial fibrillation: Secondary | ICD-10-CM | POA: Diagnosis present

## 2014-10-15 DIAGNOSIS — F329 Major depressive disorder, single episode, unspecified: Secondary | ICD-10-CM | POA: Diagnosis present

## 2014-10-15 DIAGNOSIS — I6521 Occlusion and stenosis of right carotid artery: Secondary | ICD-10-CM | POA: Diagnosis present

## 2014-10-15 DIAGNOSIS — E860 Dehydration: Secondary | ICD-10-CM | POA: Diagnosis present

## 2014-10-15 DIAGNOSIS — M25559 Pain in unspecified hip: Secondary | ICD-10-CM | POA: Diagnosis not present

## 2014-10-15 DIAGNOSIS — M6282 Rhabdomyolysis: Secondary | ICD-10-CM | POA: Diagnosis present

## 2014-10-15 DIAGNOSIS — E785 Hyperlipidemia, unspecified: Secondary | ICD-10-CM | POA: Diagnosis not present

## 2014-10-15 DIAGNOSIS — R42 Dizziness and giddiness: Secondary | ICD-10-CM | POA: Diagnosis not present

## 2014-10-15 DIAGNOSIS — Z9049 Acquired absence of other specified parts of digestive tract: Secondary | ICD-10-CM | POA: Diagnosis present

## 2014-10-15 DIAGNOSIS — E780 Pure hypercholesterolemia: Secondary | ICD-10-CM | POA: Diagnosis present

## 2014-10-15 DIAGNOSIS — I351 Nonrheumatic aortic (valve) insufficiency: Secondary | ICD-10-CM | POA: Diagnosis not present

## 2014-10-15 DIAGNOSIS — Z9181 History of falling: Secondary | ICD-10-CM

## 2014-10-15 DIAGNOSIS — I8390 Asymptomatic varicose veins of unspecified lower extremity: Secondary | ICD-10-CM | POA: Diagnosis present

## 2014-10-15 DIAGNOSIS — E039 Hypothyroidism, unspecified: Secondary | ICD-10-CM | POA: Diagnosis present

## 2014-10-15 DIAGNOSIS — Z7901 Long term (current) use of anticoagulants: Secondary | ICD-10-CM

## 2014-10-15 DIAGNOSIS — S199XXA Unspecified injury of neck, initial encounter: Secondary | ICD-10-CM | POA: Diagnosis not present

## 2014-10-15 DIAGNOSIS — S82401A Unspecified fracture of shaft of right fibula, initial encounter for closed fracture: Secondary | ICD-10-CM

## 2014-10-15 DIAGNOSIS — R296 Repeated falls: Secondary | ICD-10-CM | POA: Diagnosis not present

## 2014-10-15 DIAGNOSIS — R278 Other lack of coordination: Secondary | ICD-10-CM | POA: Diagnosis not present

## 2014-10-15 DIAGNOSIS — R531 Weakness: Secondary | ICD-10-CM | POA: Diagnosis not present

## 2014-10-15 DIAGNOSIS — I951 Orthostatic hypotension: Secondary | ICD-10-CM | POA: Diagnosis present

## 2014-10-15 DIAGNOSIS — I480 Paroxysmal atrial fibrillation: Secondary | ICD-10-CM | POA: Diagnosis present

## 2014-10-15 DIAGNOSIS — I10 Essential (primary) hypertension: Secondary | ICD-10-CM | POA: Diagnosis not present

## 2014-10-15 DIAGNOSIS — R262 Difficulty in walking, not elsewhere classified: Secondary | ICD-10-CM | POA: Diagnosis not present

## 2014-10-15 DIAGNOSIS — M1389 Other specified arthritis, multiple sites: Secondary | ICD-10-CM | POA: Diagnosis present

## 2014-10-15 DIAGNOSIS — F419 Anxiety disorder, unspecified: Secondary | ICD-10-CM | POA: Diagnosis present

## 2014-10-15 DIAGNOSIS — S99911A Unspecified injury of right ankle, initial encounter: Secondary | ICD-10-CM | POA: Diagnosis not present

## 2014-10-15 HISTORY — DX: Unspecified osteoarthritis, unspecified site: M19.90

## 2014-10-15 HISTORY — DX: Personal history of other medical treatment: Z92.89

## 2014-10-15 HISTORY — DX: Chronic kidney disease, unspecified: N18.9

## 2014-10-15 HISTORY — DX: Cardiac murmur, unspecified: R01.1

## 2014-10-15 HISTORY — DX: Chronic obstructive pulmonary disease, unspecified: J44.9

## 2014-10-15 HISTORY — DX: Syncope and collapse: R55

## 2014-10-15 HISTORY — DX: Acute embolism and thrombosis of unspecified deep veins of unspecified lower extremity: I82.409

## 2014-10-15 HISTORY — DX: Unspecified fracture of shaft of right fibula, initial encounter for closed fracture: S82.401A

## 2014-10-15 HISTORY — DX: Anxiety disorder, unspecified: F41.9

## 2014-10-15 HISTORY — DX: Repeated falls: R29.6

## 2014-10-15 HISTORY — DX: Emphysema, unspecified: J43.9

## 2014-10-15 LAB — URINALYSIS, ROUTINE W REFLEX MICROSCOPIC
BILIRUBIN URINE: NEGATIVE
GLUCOSE, UA: NEGATIVE mg/dL
Ketones, ur: NEGATIVE mg/dL
Leukocytes, UA: NEGATIVE
Nitrite: NEGATIVE
Protein, ur: NEGATIVE mg/dL
Specific Gravity, Urine: 1.013 (ref 1.005–1.030)
UROBILINOGEN UA: 1 mg/dL (ref 0.0–1.0)
pH: 5.5 (ref 5.0–8.0)

## 2014-10-15 LAB — BASIC METABOLIC PANEL
Anion gap: 5 (ref 5–15)
BUN: 12 mg/dL (ref 6–23)
CALCIUM: 8.8 mg/dL (ref 8.4–10.5)
CHLORIDE: 104 mmol/L (ref 96–112)
CO2: 31 mmol/L (ref 19–32)
Creatinine, Ser: 1.99 mg/dL — ABNORMAL HIGH (ref 0.50–1.35)
GFR calc non Af Amer: 30 mL/min — ABNORMAL LOW (ref >90)
GFR, EST AFRICAN AMERICAN: 35 mL/min — AB (ref >90)
Glucose, Bld: 115 mg/dL — ABNORMAL HIGH (ref 70–99)
Potassium: 3.5 mmol/L (ref 3.5–5.1)
SODIUM: 140 mmol/L (ref 135–145)

## 2014-10-15 LAB — URINE MICROSCOPIC-ADD ON

## 2014-10-15 LAB — CK: Total CK: 1548 U/L — ABNORMAL HIGH (ref 7–232)

## 2014-10-15 LAB — LIPASE, BLOOD: Lipase: 24 U/L (ref 11–59)

## 2014-10-15 LAB — TSH: TSH: 3.763 u[IU]/mL (ref 0.350–4.500)

## 2014-10-15 LAB — TROPONIN I
Troponin I: 0.04 ng/mL — ABNORMAL HIGH (ref <0.031)
Troponin I: 0.05 ng/mL — ABNORMAL HIGH (ref <0.031)
Troponin I: 0.04 ng/mL — ABNORMAL HIGH (ref <0.031)

## 2014-10-15 LAB — I-STAT TROPONIN, ED: Troponin i, poc: 0.03 ng/mL (ref 0.00–0.08)

## 2014-10-15 LAB — CBC
HCT: 37.8 % — ABNORMAL LOW (ref 39.0–52.0)
HEMOGLOBIN: 12.4 g/dL — AB (ref 13.0–17.0)
MCH: 31 pg (ref 26.0–34.0)
MCHC: 32.8 g/dL (ref 30.0–36.0)
MCV: 94.5 fL (ref 78.0–100.0)
Platelets: 193 10*3/uL (ref 150–400)
RBC: 4 MIL/uL — ABNORMAL LOW (ref 4.22–5.81)
RDW: 14.5 % (ref 11.5–15.5)
WBC: 6.4 10*3/uL (ref 4.0–10.5)

## 2014-10-15 LAB — BRAIN NATRIURETIC PEPTIDE: B Natriuretic Peptide: 164.8 pg/mL — ABNORMAL HIGH (ref 0.0–100.0)

## 2014-10-15 LAB — DIGOXIN LEVEL
DIGOXIN LVL: 0.8 ng/mL (ref 0.8–2.0)
Digoxin Level: 0.8 ng/mL (ref 0.8–2.0)

## 2014-10-15 MED ORDER — ALUM & MAG HYDROXIDE-SIMETH 200-200-20 MG/5ML PO SUSP
30.0000 mL | Freq: Four times a day (QID) | ORAL | Status: DC | PRN
  Administered 2014-10-16: 30 mL via ORAL
  Filled 2014-10-15: qty 30

## 2014-10-15 MED ORDER — CLONAZEPAM 0.5 MG PO TABS
0.5000 mg | ORAL_TABLET | Freq: Two times a day (BID) | ORAL | Status: DC | PRN
  Administered 2014-10-16 – 2014-10-18 (×3): 0.5 mg via ORAL
  Filled 2014-10-15 (×3): qty 1

## 2014-10-15 MED ORDER — VITAMIN D3 25 MCG (1000 UNIT) PO TABS
2000.0000 [IU] | ORAL_TABLET | Freq: Every day | ORAL | Status: DC
  Administered 2014-10-15 – 2014-10-18 (×4): 2000 [IU] via ORAL
  Filled 2014-10-15 (×4): qty 2

## 2014-10-15 MED ORDER — DOCUSATE SODIUM 100 MG PO CAPS
100.0000 mg | ORAL_CAPSULE | Freq: Two times a day (BID) | ORAL | Status: DC
  Administered 2014-10-15 – 2014-10-18 (×5): 100 mg via ORAL
  Filled 2014-10-15 (×6): qty 1

## 2014-10-15 MED ORDER — ATORVASTATIN CALCIUM 10 MG PO TABS
10.0000 mg | ORAL_TABLET | Freq: Every day | ORAL | Status: DC
  Administered 2014-10-15 – 2014-10-16 (×2): 10 mg via ORAL
  Filled 2014-10-15 (×3): qty 1

## 2014-10-15 MED ORDER — ONDANSETRON HCL 4 MG PO TABS: 4.0000 mg | ORAL_TABLET | Freq: Four times a day (QID) | ORAL | Status: DC | PRN

## 2014-10-15 MED ORDER — LAMOTRIGINE 100 MG PO TABS
100.0000 mg | ORAL_TABLET | Freq: Two times a day (BID) | ORAL | Status: DC
  Administered 2014-10-15 – 2014-10-18 (×7): 100 mg via ORAL
  Filled 2014-10-15 (×8): qty 1

## 2014-10-15 MED ORDER — ESCITALOPRAM OXALATE 20 MG PO TABS
20.0000 mg | ORAL_TABLET | Freq: Every day | ORAL | Status: DC
  Administered 2014-10-15 – 2014-10-18 (×4): 20 mg via ORAL
  Filled 2014-10-15 (×4): qty 1

## 2014-10-15 MED ORDER — MORPHINE SULFATE 2 MG/ML IJ SOLN: 1.0000 mg | INTRAMUSCULAR | Status: DC | PRN

## 2014-10-15 MED ORDER — LEVOTHYROXINE SODIUM 150 MCG PO TABS
150.0000 ug | ORAL_TABLET | Freq: Every day | ORAL | Status: DC
  Administered 2014-10-15 – 2014-10-18 (×4): 150 ug via ORAL
  Filled 2014-10-15 (×5): qty 1

## 2014-10-15 MED ORDER — ONDANSETRON HCL 4 MG/2ML IJ SOLN: 4.0000 mg | Freq: Four times a day (QID) | INTRAMUSCULAR | Status: DC | PRN

## 2014-10-15 MED ORDER — DILTIAZEM HCL ER BEADS 120 MG PO CP24
120.0000 mg | ORAL_CAPSULE | Freq: Every day | ORAL | Status: DC
  Administered 2014-10-15 – 2014-10-18 (×4): 120 mg via ORAL
  Filled 2014-10-15 (×4): qty 1

## 2014-10-15 MED ORDER — B COMPLEX PO TABS
1.0000 | ORAL_TABLET | Freq: Every day | ORAL | Status: DC
Start: 2014-10-15 — End: 2014-10-15

## 2014-10-15 MED ORDER — ASPIRIN EC 81 MG PO TBEC
81.0000 mg | DELAYED_RELEASE_TABLET | Freq: Every day | ORAL | Status: DC
  Administered 2014-10-15 – 2014-10-18 (×4): 81 mg via ORAL
  Filled 2014-10-15 (×4): qty 1

## 2014-10-15 MED ORDER — TRAMADOL HCL 50 MG PO TABS
50.0000 mg | ORAL_TABLET | Freq: Once | ORAL | Status: AC
  Administered 2014-10-15: 50 mg via ORAL
  Filled 2014-10-15: qty 1

## 2014-10-15 MED ORDER — ACETAMINOPHEN 650 MG RE SUPP: 650.0000 mg | Freq: Four times a day (QID) | RECTAL | Status: DC | PRN

## 2014-10-15 MED ORDER — ACETAMINOPHEN 325 MG PO TABS: 650.0000 mg | ORAL_TABLET | Freq: Four times a day (QID) | ORAL | Status: DC | PRN

## 2014-10-15 MED ORDER — SODIUM CHLORIDE 0.9 % IJ SOLN
3.0000 mL | Freq: Two times a day (BID) | INTRAMUSCULAR | Status: DC
  Administered 2014-10-16 – 2014-10-18 (×5): 3 mL via INTRAVENOUS

## 2014-10-15 MED ORDER — SODIUM CHLORIDE 0.9 % IV SOLN
INTRAVENOUS | Status: AC
  Administered 2014-10-15: 15:00:00 via INTRAVENOUS

## 2014-10-15 MED ORDER — APIXABAN 2.5 MG PO TABS
2.5000 mg | ORAL_TABLET | Freq: Two times a day (BID) | ORAL | Status: DC
  Administered 2014-10-15 – 2014-10-18 (×7): 2.5 mg via ORAL
  Filled 2014-10-15 (×8): qty 1

## 2014-10-15 MED ORDER — FLUTICASONE PROPIONATE 50 MCG/ACT NA SUSP: 2.0000 | Freq: Every day | NASAL | Status: DC

## 2014-10-15 MED ORDER — BUPROPION HCL ER (XL) 300 MG PO TB24
300.0000 mg | ORAL_TABLET | Freq: Every day | ORAL | Status: DC
Start: 2014-10-15 — End: 2014-10-18
  Administered 2014-10-15 – 2014-10-18 (×4): 300 mg via ORAL
  Filled 2014-10-15 (×4): qty 1

## 2014-10-15 MED ORDER — OXYCODONE HCL 5 MG PO TABS
5.0000 mg | ORAL_TABLET | ORAL | Status: DC | PRN
  Administered 2014-10-16 – 2014-10-18 (×3): 5 mg via ORAL
  Filled 2014-10-15 (×3): qty 1

## 2014-10-15 MED ORDER — ALBUTEROL SULFATE (2.5 MG/3ML) 0.083% IN NEBU: 2.5000 mg | INHALATION_SOLUTION | Freq: Four times a day (QID) | RESPIRATORY_TRACT | Status: DC | PRN

## 2014-10-15 MED ORDER — FLUTICASONE PROPIONATE 50 MCG/ACT NA SUSP
1.0000 | Freq: Every day | NASAL | Status: DC
  Administered 2014-10-16: 1 via NASAL
  Filled 2014-10-15: qty 16

## 2014-10-15 MED ORDER — BUSPIRONE HCL 15 MG PO TABS
15.0000 mg | ORAL_TABLET | Freq: Two times a day (BID) | ORAL | Status: DC
  Administered 2014-10-15 – 2014-10-18 (×7): 15 mg via ORAL
  Filled 2014-10-15 (×8): qty 1

## 2014-10-15 MED ORDER — FENTANYL CITRATE 0.05 MG/ML IJ SOLN
50.0000 ug | Freq: Once | INTRAMUSCULAR | Status: AC
  Administered 2014-10-15: 50 ug via INTRAVENOUS
  Filled 2014-10-15: qty 2

## 2014-10-15 MED ORDER — ALBUTEROL SULFATE HFA 108 (90 BASE) MCG/ACT IN AERS: 2.0000 | INHALATION_SPRAY | Freq: Four times a day (QID) | RESPIRATORY_TRACT | Status: DC | PRN

## 2014-10-15 MED ORDER — ZOLPIDEM TARTRATE 5 MG PO TABS
5.0000 mg | ORAL_TABLET | Freq: Once | ORAL | Status: AC
  Administered 2014-10-15: 5 mg via ORAL
  Filled 2014-10-15: qty 1

## 2014-10-15 MED ORDER — DIGOXIN 125 MCG PO TABS
0.1250 mg | ORAL_TABLET | Freq: Every day | ORAL | Status: DC
  Administered 2014-10-15 – 2014-10-18 (×4): 0.125 mg via ORAL
  Filled 2014-10-15 (×4): qty 1

## 2014-10-15 MED ORDER — DUTASTERIDE 0.5 MG PO CAPS
0.5000 mg | ORAL_CAPSULE | Freq: Every day | ORAL | Status: DC
Start: 2014-10-15 — End: 2014-10-18
  Administered 2014-10-16 – 2014-10-18 (×3): 0.5 mg via ORAL
  Filled 2014-10-15 (×4): qty 1

## 2014-10-15 MED ORDER — OMEGA-3-ACID ETHYL ESTERS 1 G PO CAPS
2.0000 g | ORAL_CAPSULE | Freq: Two times a day (BID) | ORAL | Status: DC
  Administered 2014-10-15 – 2014-10-18 (×7): 2 g via ORAL
  Filled 2014-10-15 (×8): qty 2

## 2014-10-15 MED ORDER — CALCIUM CARBONATE ANTACID 500 MG PO CHEW: 1.0000 | CHEWABLE_TABLET | ORAL | Status: DC | PRN

## 2014-10-15 NOTE — Progress Notes (Signed)
Pt. Refused cpap. Pt. States he used to wear cpap but has weaned himself from cpap. Pt. States he has not been wearing it at home. RT informed pt. To notify if he changes his mind and decides to wear cpap. RN present.

## 2014-10-15 NOTE — ED Notes (Signed)
Family at bedside. 

## 2014-10-15 NOTE — Progress Notes (Signed)
Attempted to see patient, but patient is not in room.  X-rays demonstrate fibular shaft fracture proximally, okay to use cam boot and weight-bear as tolerated with physical therapy. Orders written. I will try and find him later today, otherwise he can follow-up as an outpatient with me in 1-2 weeks.  Eulas PostLANDAU,Josten Warmuth P, MD

## 2014-10-15 NOTE — ED Provider Notes (Signed)
CSN: [id number]     Arrival date & time 10/15/14  0256 History  This chart was scribed for [name] Mackielga M Arlow Spiers, MD by Evon Slackerrance Branch, ED Scribe. This patient was seen in room D36C/D36C and the patient's care was started at 3:03 AM.    Chief Complaint  Patient presents with  . Loss of Consciousness   The history is provided by the patient. No language interpreter was used.   HPI Comments: [name] [name] ClusterRobert [name] is a 81 y.o. male who presents to the Emergency Department complaining of syncopal episode tonight. Pt states that he did fall but states that he is not sure what happened. Pt states that he did hit his head on the door tonight. Pt reports he was walking across the dining room when he felt "swimmy headed" and fell, striking his head on the door frame.  Pt reports about 10-30 seconds of "everything going black"  Pt states that he has fallen about 10 times in the past year. Pt states that he is having some back pain and right hip pain, right calf pain.  Son reports pt recently evaluated by [facility] for valvular heart disease, CAD, and deemed not strong enough for surgery.  Pt was sent to rehab for pt/ot, but pt wished to try therapy at home.  Pt has had 6 falls in the last 3-4 days at home.  His wife is frail and is having trouble getting him back up when he falls.  Pt is on eliquis for afib.   Past Medical History  Diagnosis Date  . Atrial fibrillation   . OSA (obstructive sleep apnea)   . Hypothyroidism   . Plantar fasciitis   . Seasonal allergies   . Decreased testosterone level   . H/O ventral hernia   . emphysema   . Varicose veins   . Hypertension   . Hypercholesteremia   . Aortic valve insufficiency   . Depression    Past Surgical History  Procedure Laterality Date  . Hernia repair    . Cholecystectomy    . Abdominal aortic aneurysm repair      X2   Family History  Problem Relation Age of Onset  . Heart disease Father   . Rheum arthritis Mother   . Rheum arthritis Father   .  Cancer Sister    History  Substance Use Topics  . Smoking status: Former Smoker -- 2.00 packs/day for 22 years    Types: Cigarettes    Quit date: 04/29/1974  . Smokeless tobacco: Never Used  . Alcohol Use: No    Review of Systems  Constitutional: Positive for fatigue.  Musculoskeletal: Positive for back pain and arthralgias.  Neurological: Positive for dizziness, syncope, weakness and light-headedness.  Psychiatric/Behavioral: Positive for confusion.     Allergies  Review of patient's allergies indicates no known allergies.  Home Medications   Prior to Admission medications   Medication Sig Start Date End Date Taking? Authorizing Provider  albuterol (PROVENTIL HFA;VENTOLIN HFA) 108 (90 BASE) MCG/ACT inhaler Inhale 2 puffs into the lungs every 6 (six) hours as needed for wheezing or shortness of breath.    Historical Provider, MD  apixaban (ELIQUIS) 2.5 MG TABS tablet Take 1 tablet (2.5 mg total) by mouth 2 (two) times daily. 06/22/14   Osvaldo ShipperGokul Krishnan, MD  aspirin 81 MG tablet Take 81 mg by mouth daily.    Historical Provider, MD  atorvastatin (LIPITOR) 10 MG tablet Take 10 mg by mouth daily at 6 PM.    Historical  Provider, MD  b complex vitamins tablet Take 1 tablet by mouth daily.    Historical Provider, MD  buPROPion (WELLBUTRIN XL) 300 MG 24 hr tablet Take 300 mg by mouth daily.    Historical Provider, MD  busPIRone (BUSPAR) 15 MG tablet Take 15 mg by mouth 2 (two) times daily.    Historical Provider, MD  calcium carbonate (TUMS - DOSED IN MG ELEMENTAL CALCIUM) 500 MG chewable tablet Chew 1 tablet by mouth as needed for indigestion or heartburn.    Historical Provider, MD  cholecalciferol (VITAMIN D) 1000 UNITS tablet Take 2,000 Units by mouth daily.    Historical Provider, MD  clonazePAM (KLONOPIN) 0.5 MG tablet Take 0.5 mg by mouth at bedtime as needed for anxiety.    Historical Provider, MD  digoxin (LANOXIN) 0.125 MG tablet Take 0.125 mg by mouth daily.    Historical  Provider, MD  diltiazem (TIAZAC) 120 MG 24 hr capsule Take 120 mg by mouth daily.    Historical Provider, MD  dutasteride (AVODART) 0.5 MG capsule Take 0.5 mg by mouth daily.    Historical Provider, MD  escitalopram (LEXAPRO) 20 MG tablet Take 20 mg by mouth daily.    Historical Provider, MD  Eszopiclone (ESZOPICLONE) 3 MG TABS Take 3 mg by mouth at bedtime. Take immediately before bedtime    Historical Provider, MD  fluticasone (FLONASE) 50 MCG/ACT nasal spray Place 1 spray into both nostrils daily. 06/22/14   Osvaldo Shipper, MD  folic acid (FOLVITE) 1 MG tablet Take 1 mg by mouth daily.    Historical Provider, MD  furosemide (LASIX) 40 MG tablet Take 40 mg by mouth.    Historical Provider, MD  lamoTRIgine (LAMICTAL) 100 MG tablet Take 100 mg by mouth 2 (two) times daily. Breakfast and dinner    Historical Provider, MD  levothyroxine (SYNTHROID, LEVOTHROID) 150 MCG tablet Take 150 mcg by mouth daily before breakfast.    Historical Provider, MD  lisinopril (PRINIVIL,ZESTRIL) 40 MG tablet Take 40 mg by mouth daily.    Historical Provider, MD  Melatonin 3 MG TABS Take 1 tablet by mouth at bedtime.    Historical Provider, MD  omega-3 acid ethyl esters (LOVAZA) 1 G capsule Take 2 g by mouth 2 (two) times daily.    Historical Provider, MD   BP 167/47 mmHg  Pulse 67  Temp(Src) 98 F (36.7 C) (Oral)  Resp 20  SpO2 92%   Physical Exam  Constitutional: He is oriented to person, place, and time. He appears well-developed and well-nourished. No distress.  HENT:  Head: Normocephalic and atraumatic.  Right Ear: External ear normal.  Left Ear: External ear normal.  Nose: Nose normal.  Mouth/Throat: Oropharynx is clear and moist.  Eyes: Conjunctivae and EOM are normal. Pupils are equal, round, and reactive to light.  Neck: Normal range of motion. Neck supple. No JVD present. No tracheal deviation present. No thyromegaly present.  Cardiovascular: Normal rate, normal heart sounds and intact distal  pulses.  Exam reveals no gallop and no friction rub.   No murmur heard. Irregular rhythm  Pulmonary/Chest: Effort normal and breath sounds normal. No stridor. No respiratory distress. He has no wheezes. He has no rales. He exhibits no tenderness.  Abdominal: Soft. Bowel sounds are normal. He exhibits no distension and no mass. There is no tenderness. There is no rebound and no guarding.  Musculoskeletal: Normal range of motion. He exhibits tenderness (tender to palpation over right upper calf). He exhibits no edema.  Lymphadenopathy:  He has no cervical adenopathy.  Neurological: He is alert and oriented to person, place, and time. He displays normal reflexes. No cranial nerve deficit. He exhibits normal muscle tone. Coordination normal.  Skin: Skin is warm and dry. Rash noted. He is not diaphoretic. No erythema. No pallor.  Diffuse abrasions to lower extremities  Psychiatric: He has a normal mood and affect. His behavior is normal. Judgment and thought content normal.  Nursing note and vitals reviewed.   ED Course  Procedures (including critical care time) DIAGNOSTIC STUDIES: Oxygen Saturation is 92% on RA, normal by my interpretation.    COORDINATION OF CARE: 3:10 AM-Discussed treatment plan with pt at bedside and pt agreed to plan.     Labs Review Labs Reviewed  CBC - Abnormal; Notable for the following:    RBC 4.00 (*)    Hemoglobin 12.4 (*)    HCT 37.8 (*)    All other components within normal limits  BASIC METABOLIC PANEL - Abnormal; Notable for the following:    Glucose, Bld 115 (*)    Creatinine, Ser 1.99 (*)    GFR calc non Af Amer 30 (*)    GFR calc Af Amer 35 (*)    All other components within normal limits  URINALYSIS, ROUTINE W REFLEX MICROSCOPIC - Abnormal; Notable for the following:    Hgb urine dipstick MODERATE (*)    All other components within normal limits  URINE CULTURE  DIGOXIN LEVEL  LIPASE, BLOOD  URINE MICROSCOPIC-ADD ON  Rosezena Sensor, ED     Imaging Review Dg Lumbar Spine Complete  10/15/2014   CLINICAL DATA:  Larey Seat tonight.  EXAM: LUMBAR SPINE - COMPLETE 4+ VIEW  COMPARISON:  06/20/2014  FINDINGS: There is no evidence of lumbar spine fracture. Alignment is normal. Intervertebral disc spaces are maintained.  IMPRESSION: Negative.   Electronically Signed   By: Ellery Plunk M.D.   On: 10/15/2014 05:57   Dg Pelvis 1-2 Views  10/15/2014   CLINICAL DATA:  Fall, loss of consciousness, pain.  EXAM: PELVIS - 1-2 VIEW  COMPARISON:  None.  FINDINGS: There is no evidence of pelvic fracture or diastasis. No pelvic bone lesions are seen.  IMPRESSION: Negative.   Electronically Signed   By: Awilda Metro   On: 10/15/2014 05:56   Ct Head Wo Contrast  10/15/2014   CLINICAL DATA:  Felt lightheaded and fell.  EXAM: CT HEAD WITHOUT CONTRAST  CT CERVICAL SPINE WITHOUT CONTRAST  TECHNIQUE: Multidetector CT imaging of the head and cervical spine was performed following the standard protocol without intravenous contrast. Multiplanar CT image reconstructions of the cervical spine were also generated.  COMPARISON:  06/20/2014  FINDINGS: CT HEAD FINDINGS  There is a left circle of Willis aneurysm clip. There is left frontal craniotomy. There is no intracranial hemorrhage, mass or evidence of acute infarction. There is no extra-axial fluid collection. There is moderate atrophy and chronic microvascular ischemic disease. There is no interval change from 06/20/2014. No acute bony abnormalities are evident. The calvarium and skullbase are intact. There is prior right maxillary antrectomy. There is resection of the right terminates.  CT CERVICAL SPINE FINDINGS  There are intact appearances of the vertebral column, pedicles and facet articulations. There is no cervical spine fracture. No acute soft tissue abnormalities are evident.  IMPRESSION: *Negative for acute intracranial traumatic injury. Moderate atrophy and chronic microvascular ischemic disease.  *Negative for acute cervical spine fracture *Left circle of Willis aneurysm clip   Electronically Signed   By:  Ellery Plunk M.D.   On: 10/15/2014 03:47   Ct Cervical Spine Wo Contrast  10/15/2014   CLINICAL DATA:  Felt lightheaded and fell.  EXAM: CT HEAD WITHOUT CONTRAST  CT CERVICAL SPINE WITHOUT CONTRAST  TECHNIQUE: Multidetector CT imaging of the head and cervical spine was performed following the standard protocol without intravenous contrast. Multiplanar CT image reconstructions of the cervical spine were also generated.  COMPARISON:  06/20/2014  FINDINGS: CT HEAD FINDINGS  There is a left circle of Willis aneurysm clip. There is left frontal craniotomy. There is no intracranial hemorrhage, mass or evidence of acute infarction. There is no extra-axial fluid collection. There is moderate atrophy and chronic microvascular ischemic disease. There is no interval change from 06/20/2014. No acute bony abnormalities are evident. The calvarium and skullbase are intact. There is prior right maxillary antrectomy. There is resection of the right terminates.  CT CERVICAL SPINE FINDINGS  There are intact appearances of the vertebral column, pedicles and facet articulations. There is no cervical spine fracture. No acute soft tissue abnormalities are evident.  IMPRESSION: *Negative for acute intracranial traumatic injury. Moderate atrophy and chronic microvascular ischemic disease. *Negative for acute cervical spine fracture *Left circle of Willis aneurysm clip   Electronically Signed   By: Ellery Plunk M.D.   On: 10/15/2014 03:47   Dg Knee Complete 4 Views Right  10/15/2014   CLINICAL DATA:  Larey Seat tonight  EXAM: RIGHT KNEE - COMPLETE 4+ VIEW  COMPARISON:  None.  FINDINGS: There is an oblique fracture of the proximal fibular diaphysis. No other acute fractures are evident. No acute soft tissue abnormalities are evident. Calcification adjacent to the medial femoral condyle likely represents sequela from old MCL  trauma. Mild osteoarthritic changes are present.  IMPRESSION: Acute fracture of the proximal fibular diaphysis   Electronically Signed   By: Ellery Plunk M.D.   On: 10/15/2014 05:59     EKG Interpretation   Date/Time:  Tuesday October 15 2014 02:59:03 EST Ventricular Rate:  65 PR Interval:    QRS Duration: 112 QT Interval:  399 QTC Calculation: 415 R Axis:   5 Text Interpretation:  Atrial fibrillation Abnormal R-wave progression,  early transition LVH with secondary repolarization abnormality Since last  tracing rate faster Confirmed by Demontrez Rindfleisch  MD, Justise Ehmann ([id number]) on 10/15/2014  3:11:00 AM      MDM   Final diagnoses:  Syncope, unspecified syncope type  Frequent falls  Closed fracture of proximal fibula, right, initial encounter      I personally performed the services described in this documentation, which was scribed in my presence. The recorded information has been reviewed and is accurate.  81 yo male with syncope, multiple recent falls.  Has h/o afib, currently controlled, CAD, aortic insufficiency.  Plan for CT head,cspine given fall, labs, xrays of pelvis.  Feel patient will need admission.   6:55 AM Case discussed with Dr Dion Saucier on call for orthopedics regarding right proximal fibular fracture.  He recommends adding on right ankle to assess for Maisonneuve fracture.  He also recommends cam walker.  [name] Mackie, MD 10/15/14 [phone]

## 2014-10-15 NOTE — Progress Notes (Signed)
PT Cancellation Note  Patient Details Name: Shari ProwsChrysler Avi Burda MRN: [id number] DOB: [birth year]   Cancelled Treatment:    Reason Eval/Treat Not Completed: Patient not medically ready   Noted serial troponins ordered after PT order and 1st report returned with slightly elevated troponin. Noted bedrest order written after activity and PT order.  Await result of further troponins and MD to incr activity status. Will follow-up 10/16/14   Ko Bardon 10/15/2014, 2:09 PM Pager [phone]

## 2014-10-15 NOTE — ED Notes (Signed)
Patient transported to CT 

## 2014-10-15 NOTE — Progress Notes (Addendum)
Bilateral carotid artery duplex completed.  Right:  40-59% ICA stenosis.  Left:  Common and internal carotid artery not insonated.  Bilateral:  Vertebral artery flow is antegrade.

## 2014-10-15 NOTE — ED Notes (Signed)
Patient transported to X-ray 

## 2014-10-15 NOTE — Discharge Instructions (Signed)

## 2014-10-15 NOTE — H&P (Signed)
[facility] History and Physical                                                                                    [name] [name], is a 81 y.o. male  MRN: [id number]   DOB - [birth year]  Admit Date - 10/15/2014  Outpatient Primary MD for the patient is FULP, CAMMIE, MD  With History of -  Past Medical History  Diagnosis Date  . Atrial fibrillation   . OSA (obstructive sleep apnea)   . Hypothyroidism   . Plantar fasciitis   . Seasonal allergies   . Decreased testosterone level   . H/O ventral hernia   . emphysema   . Varicose veins   . Hypertension   . Hypercholesteremia   . Aortic valve insufficiency   . Depression       Past Surgical History  Procedure Laterality Date  . Hernia repair    . Cholecystectomy    . Abdominal aortic aneurysm repair      X2    in for   Chief Complaint  Patient presents with  . Loss of Consciousness     HPI  [name] [name]  is a 82 y.o. male, with a history of afib, OSA, emphysema, hypothyroidism, craniotomy/aneurysm clip and severe aortic regurgitation.  He is currently undergoing cardiac work up for aortic repair at [facility].  He presents to [facility] today with Syncope and collapse.  He was discharged from [facility] recently and admitted to rehab to gain strength prior to surgery.   He was unhappy in rehab after 1 week and moved back to his home with his wife.  Per his son he has not been doing well since returning home.  [name] [name] reports falling down over 20x in the past year. He states he will have a momentary period of visual disturbance and then will unexpectedly fall or run into something - such as a door frame.  Over the past 6 days he reports an increasing cough and he has developed "lung pain".  His son reports the patient has been confused and slightly agitated recently.  On 2/15 he fell and was on the floor for several hours.  This morning at approximately 2:00 am, he got up then fell and was out for only a short period.    In the ER Mr.  [name] was orthostatic on exam.  SBP 160 lying and 125 when standing.  He has acute renal failure with a creatinine of 1.99 compared to baseline of approximately 1.5.  His BNP is minimally elevated as is his troponin (0.04).  Digoxin level is normal (0.8).  CT head and spine do not show acute changes. Xray revealed a fracture of the right proximal fibular diaphysis.  Dr. Dion Saucier was consulted.   Review of Systems   In addition to the HPI above,  No Fever-chills, No Headache, No changes with Vision or hearing, No problems swallowing food or Liquids, +cough No Abdominal pain, No Nausea or Vomiting, Bowel movements are regular, No Blood in stool or Urine, No dysuria, No new skin rashes or bruises, No new joints pains-aches,   A full 10 point Review of Systems was  done, except as stated above, all other Review of Systems were negative.  Social History History  Substance Use Topics  . Smoking status: Former Smoker -- 2.00 packs/day for 22 years    Types: Cigarettes    Quit date: 04/29/1974  . Smokeless tobacco: Never Used  . Alcohol Use: No    Family History Family History  Problem Relation Age of Onset  . Heart disease Father   . Rheum arthritis Mother   . Rheum arthritis Father   . Cancer Sister     Prior to Admission medications   Medication Sig Start Date End Date Taking? Authorizing Provider  albuterol (PROVENTIL HFA;VENTOLIN HFA) 108 (90 BASE) MCG/ACT inhaler Inhale 2 puffs into the lungs every 6 (six) hours as needed for wheezing or shortness of breath.   Yes Historical Provider, MD  apixaban (ELIQUIS) 2.5 MG TABS tablet Take 1 tablet (2.5 mg total) by mouth 2 (two) times daily. 06/22/14  Yes Osvaldo Shipper, MD  aspirin 81 MG tablet Take 81 mg by mouth daily.   Yes Historical Provider, MD  atorvastatin (LIPITOR) 10 MG tablet Take 10 mg by mouth daily at 6 PM.   Yes Historical Provider, MD  b complex vitamins tablet Take 1 tablet by mouth daily.   Yes Historical Provider, MD   buPROPion (WELLBUTRIN XL) 300 MG 24 hr tablet Take 300 mg by mouth daily.   Yes Historical Provider, MD  busPIRone (BUSPAR) 15 MG tablet Take 15 mg by mouth 2 (two) times daily.   Yes Historical Provider, MD  calcium carbonate (TUMS - DOSED IN MG ELEMENTAL CALCIUM) 500 MG chewable tablet Chew 1 tablet by mouth as needed for indigestion or heartburn.   Yes Historical Provider, MD  cholecalciferol (VITAMIN D) 1000 UNITS tablet Take 2,000 Units by mouth daily.   Yes Historical Provider, MD  clonazePAM (KLONOPIN) 0.5 MG tablet Take 0.5 mg by mouth at bedtime as needed for anxiety.   Yes Historical Provider, MD  digoxin (LANOXIN) 0.125 MG tablet Take 0.125 mg by mouth daily.   Yes Historical Provider, MD  diltiazem (TIAZAC) 120 MG 24 hr capsule Take 120 mg by mouth daily.   Yes Historical Provider, MD  dutasteride (AVODART) 0.5 MG capsule Take 0.5 mg by mouth daily.   Yes Historical Provider, MD  escitalopram (LEXAPRO) 20 MG tablet Take 20 mg by mouth daily.   Yes Historical Provider, MD  Eszopiclone (ESZOPICLONE) 3 MG TABS Take 3 mg by mouth at bedtime. Take immediately before bedtime   Yes Historical Provider, MD  fluticasone (FLONASE) 50 MCG/ACT nasal spray Place 1 spray into both nostrils daily. 06/22/14  Yes Osvaldo Shipper, MD  folic acid (FOLVITE) 1 MG tablet Take 1 mg by mouth daily.   Yes Historical Provider, MD  furosemide (LASIX) 40 MG tablet Take 40 mg by mouth.   Yes Historical Provider, MD  lamoTRIgine (LAMICTAL) 100 MG tablet Take 100 mg by mouth 2 (two) times daily. Breakfast and dinner   Yes Historical Provider, MD  levothyroxine (SYNTHROID, LEVOTHROID) 150 MCG tablet Take 150 mcg by mouth daily before breakfast.   Yes Historical Provider, MD  lisinopril (PRINIVIL,ZESTRIL) 40 MG tablet Take 40 mg by mouth daily.   Yes Historical Provider, MD  Melatonin 3 MG TABS Take 1 tablet by mouth at bedtime.   Yes Historical Provider, MD  naproxen sodium (ANAPROX) 220 MG tablet Take 220 mg by mouth  2 (two) times daily as needed (for pain).   Yes Historical Provider, MD  omega-3 acid ethyl esters (LOVAZA) 1 G capsule Take 2 g by mouth 2 (two) times daily.   Yes Historical Provider, MD    No Known Allergies  Physical Exam  Vitals  Blood pressure 120/68, pulse 98, temperature 97.9 F (36.6 C), temperature source Oral, resp. rate 19, SpO2 97 %.   General:  Elderly, overweight pleasant male, lying in bed in NAD, difficult historian (Tangential)  Psych:  Normal affect and insight, Not Suicidal or Homicidal, Awake Alert, Oriented X 3.  Neuro:   No F.N deficits, ALL C.Nerves Intact, Strength 5/5 all 4 extremities, Sensation intact all 4 extremities.  ENT:  Ears and Eyes appear Normal, Conjunctivae clear  Neck:  Supple, No lymphadenopathy appreciated  Respiratory:  Symmetrical chest wall movement, Good air movement bilaterally, CTAB.  Cardiac: irreg rhythm, rate controlled, No Murmurs, no LE edema noted, no JVD.    Abdomen:  Positive bowel sounds, Soft, Non tender, Non distended,  No masses appreciated  Skin:  No Cyanosis, Normal Skin Turgor, No Skin Rash or Bruise.  Extremities:  Right lower extremity in "cam walker" boot.  Able to move other extremities 5/5 strength in each.  Data Review  CBC  Recent Labs Lab 10/15/14 0300  WBC 6.4  HGB 12.4*  HCT 37.8*  PLT 193  MCV 94.5  MCH 31.0  MCHC 32.8  RDW 14.5    Chemistries   Recent Labs Lab 10/15/14 0300  NA 140  K 3.5  CL 104  CO2 31  GLUCOSE 115*  BUN 12  CREATININE 1.99*  CALCIUM 8.8     Recent Labs  10/15/14 1117  TSH 3.763    Cardiac Enzymes  Recent Labs Lab 10/15/14 1117  TROPONINI 0.04*    Invalid input(s): POCBNP  Urinalysis    Component Value Date/Time   COLORURINE YELLOW 10/15/2014 0503   APPEARANCEUR CLEAR 10/15/2014 0503   LABSPEC 1.013 10/15/2014 0503   PHURINE 5.5 10/15/2014 0503   GLUCOSEU NEGATIVE 10/15/2014 0503   HGBUR MODERATE* 10/15/2014 0503   BILIRUBINUR  NEGATIVE 10/15/2014 0503   KETONESUR NEGATIVE 10/15/2014 0503   PROTEINUR NEGATIVE 10/15/2014 0503   UROBILINOGEN 1.0 10/15/2014 0503   NITRITE NEGATIVE 10/15/2014 0503   LEUKOCYTESUR NEGATIVE 10/15/2014 0503    Imaging results:   Dg Lumbar Spine Complete  10/15/2014   CLINICAL DATA:  Larey Seat tonight.  EXAM: LUMBAR SPINE - COMPLETE 4+ VIEW  COMPARISON:  06/20/2014  FINDINGS: There is no evidence of lumbar spine fracture. Alignment is normal. Intervertebral disc spaces are maintained.  IMPRESSION: Negative.   Electronically Signed   By: Ellery Plunk M.D.   On: 10/15/2014 05:57   Dg Pelvis 1-2 Views  10/15/2014   CLINICAL DATA:  Fall, loss of consciousness, pain.  EXAM: PELVIS - 1-2 VIEW  COMPARISON:  None.  FINDINGS: There is no evidence of pelvic fracture or diastasis. No pelvic bone lesions are seen.  IMPRESSION: Negative.   Electronically Signed   By: Awilda Metro   On: 10/15/2014 05:56   Dg Ankle 2 Views Right  10/15/2014   CLINICAL DATA:  81 year old male with history of fall 1 week ago complaining of pain in the right ankle.  EXAM: RIGHT ANKLE - 2 VIEW  COMPARISON:  No priors.  FINDINGS: Two views of the right ankle demonstrate no acute displaced fracture, subluxation or dislocation. Numerous phleboliths are noted.  IMPRESSION: 1. No acute findings.   Electronically Signed   By: Trudie Reed M.D.   On: 10/15/2014 07:53  Ct Head Wo Contrast  10/15/2014   CLINICAL DATA:  Felt lightheaded and fell.  EXAM: CT HEAD WITHOUT CONTRAST  CT CERVICAL SPINE WITHOUT CONTRAST  TECHNIQUE: Multidetector CT imaging of the head and cervical spine was performed following the standard protocol without intravenous contrast. Multiplanar CT image reconstructions of the cervical spine were also generated.  COMPARISON:  06/20/2014  FINDINGS: CT HEAD FINDINGS  There is a left circle of Willis aneurysm clip. There is left frontal craniotomy. There is no intracranial hemorrhage, mass or evidence of  acute infarction. There is no extra-axial fluid collection. There is moderate atrophy and chronic microvascular ischemic disease. There is no interval change from 06/20/2014. No acute bony abnormalities are evident. The calvarium and skullbase are intact. There is prior right maxillary antrectomy. There is resection of the right terminates.  CT CERVICAL SPINE FINDINGS  There are intact appearances of the vertebral column, pedicles and facet articulations. There is no cervical spine fracture. No acute soft tissue abnormalities are evident.  IMPRESSION: *Negative for acute intracranial traumatic injury. Moderate atrophy and chronic microvascular ischemic disease. *Negative for acute cervical spine fracture *Left circle of Willis aneurysm clip   Electronically Signed   By: Ellery Plunkaniel R Mitchell M.D.   On: 10/15/2014 03:47   Ct Cervical Spine Wo Contrast  10/15/2014   CLINICAL DATA:  Felt lightheaded and fell.  EXAM: CT HEAD WITHOUT CONTRAST  CT CERVICAL SPINE WITHOUT CONTRAST  TECHNIQUE: Multidetector CT imaging of the head and cervical spine was performed following the standard protocol without intravenous contrast. Multiplanar CT image reconstructions of the cervical spine were also generated.  COMPARISON:  06/20/2014  FINDINGS: CT HEAD FINDINGS  There is a left circle of Willis aneurysm clip. There is left frontal craniotomy. There is no intracranial hemorrhage, mass or evidence of acute infarction. There is no extra-axial fluid collection. There is moderate atrophy and chronic microvascular ischemic disease. There is no interval change from 06/20/2014. No acute bony abnormalities are evident. The calvarium and skullbase are intact. There is prior right maxillary antrectomy. There is resection of the right terminates.  CT CERVICAL SPINE FINDINGS  There are intact appearances of the vertebral column, pedicles and facet articulations. There is no cervical spine fracture. No acute soft tissue abnormalities are evident.   IMPRESSION: *Negative for acute intracranial traumatic injury. Moderate atrophy and chronic microvascular ischemic disease. *Negative for acute cervical spine fracture *Left circle of Willis aneurysm clip   Electronically Signed   By: Ellery Plunkaniel R Mitchell M.D.   On: 10/15/2014 03:47   Dg Knee Complete 4 Views Right  10/15/2014   CLINICAL DATA:  Larey SeatFell tonight  EXAM: RIGHT KNEE - COMPLETE 4+ VIEW  COMPARISON:  None.  FINDINGS: There is an oblique fracture of the proximal fibular diaphysis. No other acute fractures are evident. No acute soft tissue abnormalities are evident. Calcification adjacent to the medial femoral condyle likely represents sequela from old MCL trauma. Mild osteoarthritic changes are present.  IMPRESSION: Acute fracture of the proximal fibular diaphysis   Electronically Signed   By: Ellery Plunkaniel R Mitchell M.D.   On: 10/15/2014 05:59    My personal review of EKG: afib rate controlled.   Assessment & Plan  Principal Problem:   Syncope Active Problems:   Atrial fibrillation, chronic   Hypothyroidism   Hypertension   Hypercholesteremia   Aortic valve insufficiency   CAD (coronary artery disease), native coronary artery   Falls frequently   Acute on chronic renal failure   Right fibular fracture  Hypoxia   Syncope and collapse    Syncope Multiple possible etiologies:   Orthostasis, Dehydration, Polypharmacy and cardiac. Patient is currently undergoing an extensive cardiac work up at [facility] so I will not obtain a 2D echo here.  However, after talking to his son Cleone Slim, I learned that he has an appt scheduled with Dr. Elease Hashimoto on 2/23 in order to establish cardiology care in [place].  So please contact cardiology here if needed. Check Carotid dopplers, daily Orthostatics, and dig level. Admit to tele bed.  Gently hydrate.  Cycle troponin PT / OT will need SNF on discharge.  Per son, he will likely need encouragement to go to SNF.  Acute kidney failure Baseline creatinine 1.5 now  1.99.   Will hydrate.  Hold lisinopril and lasix.   Check a CK for possible Rhabdo given that he was on the floor for several hours on 2/15.  Proximal Fibula Fracture. Dr. Dion Saucier consulted.  Boot placed.  Recommend rehab.  AFib. Rate controlled.  On eliquis  Hypothyroidism.  Continue Synthroid.  TSH WNL.  Orthostatic Hypotension Hold lasix and lisinopril.  Give IVF.  Will continue Digoxin, Avodart and diltiazem.  Consider further decreases of BP meds if he remains orthostatic after hydration.  Depression/Psychotropics On lamictal, klonopin, Eszopiclone, Lexapro, buspar, wellbutrin.  May consider psych consultation   OSA Will order CPAP QHS.  Urine culture  Per EDP urine had a strong odor and a U/A and culture was ordered.   U/A appeared negative for infection - other than some RBCs.  Please follow culture.  Coronary artery disease On ASA  Cough and "lung pain" From the notes it appears the patient has a chronic cough. Will check a CXR and add flonase for sinus drainage. Cycling troponins.    DVT Prophylaxis: Eliquis  AM Labs Ordered, also please review Full Orders  Family Communication:   None bedside.  Patient is alert and orientated.  Code Status:  DNI, but will to be defibrillated.  Condition:  Guarded but stable.  Time spent in minutes : [address]  PA-C on 10/15/2014 at 2:28 PM  Between 7am to 7pm - Pager - [phone]  After 7pm go to [email or web address] - password [email or web address]  And look for the night coverage person covering me after hours  [facility]

## 2014-10-15 NOTE — ED Notes (Signed)
Pt in via EMS from home, pt was in his kitchen and felt "foggy headed", then fell, pt thinks he passed out for a second, c/o pain to lower back and right hip pain, no deformity noted for EMS, pt laid on the floor for an hour before calling EMS, alert and oriented, denies chest pain, CBG 155

## 2014-10-15 NOTE — ED Notes (Signed)
Patient is back to D-36 from CT.

## 2014-10-15 NOTE — Progress Notes (Signed)
Utilization review completed.  

## 2014-10-16 DIAGNOSIS — R441 Visual hallucinations: Secondary | ICD-10-CM | POA: Diagnosis present

## 2014-10-16 DIAGNOSIS — I482 Chronic atrial fibrillation: Secondary | ICD-10-CM

## 2014-10-16 DIAGNOSIS — H5316 Psychophysical visual disturbances: Secondary | ICD-10-CM

## 2014-10-16 LAB — BASIC METABOLIC PANEL
Anion gap: 6 (ref 5–15)
BUN: 12 mg/dL (ref 6–23)
CHLORIDE: 105 mmol/L (ref 96–112)
CO2: 29 mmol/L (ref 19–32)
Calcium: 8.5 mg/dL (ref 8.4–10.5)
Creatinine, Ser: 1.6 mg/dL — ABNORMAL HIGH (ref 0.50–1.35)
GFR calc non Af Amer: 39 mL/min — ABNORMAL LOW (ref >90)
GFR, EST AFRICAN AMERICAN: 45 mL/min — AB (ref >90)
Glucose, Bld: 104 mg/dL — ABNORMAL HIGH (ref 70–99)
Potassium: 3.5 mmol/L (ref 3.5–5.1)
Sodium: 140 mmol/L (ref 135–145)

## 2014-10-16 LAB — URINE CULTURE
Colony Count: NO GROWTH
Culture: NO GROWTH

## 2014-10-16 NOTE — Evaluation (Signed)
Physical Therapy Evaluation Patient Details Name: [name] [name] [name] [name] MRN: [id number] DOB: 1932-02-06 Today's Date: 10/16/2014   History of Present Illness  [name] was admitted 10/15/14 w/ dx syncope, Acute kidney failure, fibular fx; He is currently undergoing cardiac work up for [facility] repair at [facility]. [facility] with Syncope and collapse. He was discharged from [facility] recently and admitted to rehab to gain strength prior to surgery. He was unhappy in rehab after 1 week and moved back to his home with his wife. Per his son he has not been doing well since returning home. [name] reports falling down over 20x in the past year. He states he will have a momentary period of visual disturbance and then will unexpectedly fall or run into something - such as a door frame. Over the past 6 days he reports an increasing cough and he has developed "lung pain". His son reports the patient has been confused and slightly agitated recently. On 2/15 he fell and was on the floor for several hours. This morning at approximately 2:00 am, he got up then fell and was out for only a short period.   Clinical Impression  Pt was seen for continuation of therapy and noted his unsafe gait on RW.  Pt has trouble with limits and is quite weak already, with reduced strength 4- BLE's hams, DF.  He  Maintained O2 sat with supplemental tank for gait but only walked 12' x 2.  Will anticipate dc to SNF for follow up     Follow Up Recommendations SNF;Supervision/Assistance - 24 hour    Equipment Recommendations  None recommended by PT    Recommendations for Other Services       Precautions / Restrictions Precautions Precautions: Fall Precaution Comments: Pt will frequent falls at home; boot RLE 2* proximal R fibular diaphysis fx Required Braces or Orthoses: Other Brace/Splint Other Brace/Splint: Boot R LE (see above) Restrictions Weight Bearing Restrictions: Yes RUE Weight Bearing: Weight bearing  as tolerated LUE Weight Bearing: Weight bearing as tolerated      Mobility  Bed Mobility Overal bed mobility: Needs Assistance Bed Mobility: Supine to Sit;Sidelying to Sit   Sidelying to sit: Mod assist;HOB elevated Supine to sit: Mod assist;HOB elevated     General bed mobility comments: up when PT entered  Transfers Overall transfer level: Needs assistance Equipment used: Rolling walker (2 wheeled) Transfers: Sit to/from [employer] CorporationStand;Stand Pivot Transfers Sit to Stand: Mod assist;+2 physical assistance;+2 safety/equipment Stand pivot transfers: Mod assist       General transfer comment: PT saw alone but had nursing nearby to be available to help, would not go farther to walk without 2 person assist  Ambulation/Gait Ambulation/Gait assistance: Min assist;Mod assist Ambulation Distance (Feet): 12 Feet Assistive device: Rolling walker (2 wheeled) Gait Pattern/deviations: Step-through pattern;Decreased step length - right;Decreased step length - left;Decreased stride length;Decreased stance time - right;Decreased dorsiflexion - right (cam boot RLE) Gait velocity: slow and halting Gait velocity interpretation: Below normal speed for age/gender General Gait Details: pt is painful initally on RLE and could only walk at a reduced speed with very controlled use of walker, unsafe wtih his awareness of controllng walker and turns  Careers information officertairs            Wheelchair Mobility    Modified Rankin (Stroke Patients Only)       Balance Overall balance assessment: Needs assistance Sitting-balance support: Feet supported Sitting balance-Leahy Scale: Good   Postural control: Posterior lean Standing balance support: Bilateral  upper extremity supported Standing balance-Leahy Scale: Poor Standing balance comment: tremors with use of UE's on walker                             Pertinent Vitals/Pain Pain Assessment: Faces Faces Pain Scale: Hurts little more Pain Location: R  fibula Pain Intervention(s): Limited activity within patient's tolerance;Monitored during session;Repositioned    Home Living Family/patient expects to be discharged to:: Private residence Living Arrangements: Spouse/significant other Available Help at Discharge: Family;Available 24 hours/day Type of Home: House Home Access: Stairs to enter Entrance Stairs-Rails: None Entrance Stairs-Number of Steps: 4 Home Layout: Multi-level Home Equipment: Walker - 4 wheels;Walker - 2 wheels;Cane - quad;Cane - single point Additional Comments: may benefit from shower bench and 3 in one commode seat    Prior Function Level of Independence: Needs assistance   Gait / Transfers Assistance Needed: falls recently  ADL's / Homemaking Assistance Needed: Meal prep and homemaking; pt reports that he is I bathing/dressing No family present to verify.        Hand Dominance   Dominant Hand: Right    Extremity/Trunk Assessment   Upper Extremity Assessment: Overall WFL for tasks assessed           Lower Extremity Assessment: Generalized weakness      Cervical / Trunk Assessment: Normal  Communication   Communication: No difficulties  Cognition Arousal/Alertness: Awake/alert Behavior During Therapy: WFL for tasks assessed/performed Overall Cognitive Status: Impaired/Different from baseline Area of Impairment: Attention;Following commands;Safety/judgement;Awareness;Problem solving Orientation Level: Place;Situation Current Attention Level: Alternating Memory: Decreased short-term memory Following Commands: Follows one step commands inconsistently;Follows one step commands with increased time Safety/Judgement: Decreased awareness of safety Awareness: Anticipatory;Intellectual Problem Solving: Slow processing;Requires verbal cues;Decreased initiation;Difficulty sequencing (PT set up transfers each time, pt forgetting hand placmeemtn) General Comments: son was present to verify that pt's wife  wont be able to physically assist him.  He is 6' tall and very heavy, not an easy transfer for wife    General Comments General comments (skin integrity, edema, etc.): Pt is weak and a little rattled about using the cam boot.    Exercises General Exercises - Lower Extremity Ankle Circles/Pumps: AAROM;Left;5 reps Quad Sets: AROM;Both;10 reps Gluteal Sets: AROM;Both;10 reps Short Arc Quad: Strengthening;Left;10 reps Hip ABduction/ADduction: AROM;Both;10 reps      Assessment/Plan    PT Assessment Patient needs continued PT services  PT Diagnosis Difficulty walking;Acute pain   PT Problem List Decreased strength;Decreased range of motion;Decreased activity tolerance;Decreased balance;Decreased mobility;Decreased coordination;Decreased cognition;Decreased knowledge of use of DME;Decreased safety awareness;Decreased knowledge of precautions;Cardiopulmonary status limiting activity;Obesity;Pain  PT Treatment Interventions DME instruction;Gait training;Stair training;Functional mobility training;Therapeutic activities;Therapeutic exercise;Balance training;Neuromuscular re-education;Cognitive remediation;Patient/family education   PT Goals (Current goals can be found in the Care Plan section) Acute Rehab PT Goals Patient Stated Goal: Go home PT Goal Formulation: With patient Time For Goal Achievement: 10/30/14 Potential to Achieve Goals: Good    Frequency Min 3X/week   Barriers to discharge Inaccessible home environment;Decreased caregiver support      Co-evaluation               End of Session Equipment Utilized During Treatment: Gait belt Activity Tolerance: Patient tolerated treatment well;Patient limited by pain;Patient limited by fatigue Patient left: in chair;with call bell/phone within reach;with chair alarm set Nurse Communication: Mobility status         Time: 1610-9604 PT Time Calculation (min) (ACUTE ONLY): 28 min   Charges:  PT Evaluation $Initial PT  Evaluation Tier I: 1 Procedure PT Treatments $Gait Training: 8-22 mins   PT G Codes:        Ivar Drape 11/09/2014, 11:42 AM   Samul Dada, PT MS Acute Rehab Dept. Number: [phone]

## 2014-10-16 NOTE — Evaluation (Signed)
Occupational Therapy Evaluation Patient Details Name: [name] [name] MRN: [id number] DOB: [birth year] Today's Date: 10/16/2014    History of Present Illness [name] was admitted 10/15/14 w/ dx syncope, Acute kidney failure, fibular fx; He is currently undergoing cardiac work [facility] at [facility]. [facility] with Syncope and collapse. He was discharged from [facility] recently and admitted to rehab to gain strength prior to surgery. He was unhappy in rehab after 1 week and moved back to his home with his wife. Per his son he has not been doing well since returning home. [name] reports falling down over 20x in the past year. He states he will have a momentary period of visual disturbance and then will unexpectedly fall or run into something - such as a door frame. Over the past 6 days he reports an increasing cough and he has developed "lung pain". His son reports the patient has been confused and slightly agitated recently. On 2/15 he fell and was on the floor for several hours. This morning at approximately 2:00 am, he got up then fell and was out for only a short period.    Clinical Impression   Pt is an 81 y/o admitted above w/ h/o frequent falls & is currently Mod A for sit to stand & SPT (pt is +2 for safety/equipment) and w/ decreased cognition/hallucinations and decreased awareness of deficits. Pt should benefit from SNF Rehab if he is agreeable as he reports that his wife is not in good health. Will follow acutely for OT (see OT problem list below) to assist with maximizing I ADL's & functional transfers.    Follow Up Recommendations  SNF;Supervision/Assistance - 24 hour    Equipment Recommendations  Other (comment) (Defer to next venue)    Recommendations for Other Services       Precautions / Restrictions Precautions Precautions: Fall Precaution Comments: Pt will frequent falls at home; boot RLE 2* proximal R fibular diaphysis fx Required  Braces or Orthoses: Other Brace/Splint Other Brace/Splint: Boot R LE (see above) Restrictions Weight Bearing Restrictions: Yes RUE Weight Bearing: Weight bearing as tolerated LUE Weight Bearing: Weight bearing as tolerated      Mobility Bed Mobility Overal bed mobility: Needs Assistance Bed Mobility: Supine to Sit;Sidelying to Sit   Sidelying to sit: Mod assist;HOB elevated Supine to sit: Mod assist;HOB elevated     General bed mobility comments: Physical assist of UB and then assist w/ pad as pt required assist to scoot to EOB  Transfers Overall transfer level: Needs assistance Equipment used: Rolling walker (2 wheeled) Transfers: Sit to/from [employer]GI CorporationStand;Stand Pivot Transfers Sit to Stand: Mod assist;+2 safety/equipment Stand pivot transfers: Mod assist;+2 safety/equipment       General transfer comment: Pt with Mod A for transfers and significant h/o falls at home, he would benefit from +2 for safety and equipment during transfers    Balance Overall balance assessment: Needs assistance;History of Falls Sitting-balance support: Bilateral upper extremity supported;Feet supported Sitting balance-Leahy Scale: Good     Standing balance support: Bilateral upper extremity supported Standing balance-Leahy Scale: Poor Standing balance comment: Pt reports feeling "shaky" in UE's and LE's in static standing using RW                            ADL Overall ADL's : Needs assistance/impaired Eating/Feeding: Modified independent;Sitting;Bed level   Grooming: Wash/dry hands;Wash/dry face;Set up;Sitting;Supervision/safety   Upper Body Bathing: Set up;Sitting  Lower Body Bathing: Moderate assistance;Sit to/from stand   Upper Body Dressing : Set up;Supervision/safety;Sitting   Lower Body Dressing: Moderate assistance;Sit to/from stand   Toilet Transfer: Stand-pivot;BSC;Moderate assistance;RW;Requires wide/bariatric;Cueing for safety;Cueing for sequencing   Toileting-  Architect and Hygiene: Moderate assistance;Sit to/from stand;Cueing for safety;Cueing for sequencing       Functional mobility during ADLs: Moderate assistance;Cueing for safety;Cueing for sequencing;Rolling walker (For SPT from EOB to chair) General ADL Comments: Pt was educated in role of OT. He participated in brief ADL retraining session for functional mobility and transfers in preparation for increased participation in ADL's. Pt currently Mod A for sit to stand and w/ decreased cognition/hallucinations and decreased awareness of deficits. Pt should benefit from SNF Rehab if he is agreeable as he reports that his wife is not in good health. Will follow acutely for OT     Vision  Wears glasses for reading; no changes from baseline, however, pt reports hallucinations. RN and MD made aware.   Perception     Praxis      Pertinent Vitals/Pain Pain Assessment: No/denies pain     Hand Dominance Right   Extremity/Trunk Assessment Upper Extremity Assessment Upper Extremity Assessment: Overall WFL for tasks assessed (Pt with generalized resting tremor noted bilateral UE's)   Lower Extremity Assessment Lower Extremity Assessment: Defer to PT evaluation       Communication Communication Communication: No difficulties   Cognition Arousal/Alertness: Awake/alert Behavior During Therapy: WFL for tasks assessed/performed Overall Cognitive Status: Impaired/Different from baseline Area of Impairment: Orientation;Memory;Awareness;Problem solving;Safety/judgement (Pt with decreased awareness of deficits) Orientation Level: Disoriented to;Place;Time   Memory: Decreased short-term memory       Problem Solving: Slow processing;Requires verbal cues;Requires tactile cues;Decreased initiation General Comments: Pt reports having hallucinations to this therapist and RN and MD were notified. Pt states that he "sees things sometimes" to include people, teenagers singing and dancing and  his walls and wallpaper border moving. Pt indicated to MD that this has been going on for "about 2 months now" Pt also states that family is aware, but he has not told any other doctor or medical personel until now.   General Comments       Exercises       Shoulder Instructions      Home Living Family/patient expects to be discharged to:: Private residence (Per chart review, pt has been confused per H&P, no family present to verify but info is from  3 months ago.) Living Arrangements: Spouse/significant other Available Help at Discharge: Family;Available 24 hours/day Type of Home: House Home Access: Stairs to enter [employer] of Steps: 4 Entrance Stairs-Rails: None Home Layout: Multi-level Alternate Level Stairs-Number of Steps: 13 Alternate Level Stairs-Rails: Left Bathroom Shower/Tub: Walk-in shower;Door   [employer] Toilet: Standard     Home Equipment: Environmental consultant - 4 wheels;Cane - quad          Prior Functioning/Environment Level of Independence: Needs assistance  Gait / Transfers Assistance Needed: "I have trouble with my walking" Pt reports significant amount of falls at home (recently d/c himself from Rehab (see visit information in chart). ADL's / Homemaking Assistance Needed: Meal prep and homemaking; pt reports that he is I bathing/dressing No family present to verify. Communication / Swallowing Assistance Needed: WFL      OT Diagnosis: Generalized weakness;Altered mental status (Pt reportsing recent hallucinations over last 2 months)   OT Problem List: Decreased strength;Decreased activity tolerance;Impaired balance (sitting and/or standing);Decreased safety awareness;Decreased knowledge of use of DME or AE;Decreased knowledge  of precautions;Obesity;Decreased cognition;Cardiopulmonary status limiting activity;Other (comment) (Significant PMH including h/o frequent falls)   OT Treatment/Interventions: Self-care/ADL training;DME and/or AE  instruction;Patient/family education;Cognitive remediation/compensation;Therapeutic activities;Therapeutic exercise;Balance training    OT Goals(Current goals can be found in the care plan section) Acute Rehab OT Goals Patient Stated Goal: Go home Time For Goal Achievement: 10/30/14 Potential to Achieve Goals: Good  OT Frequency: Min 2X/week   Barriers to D/C: Decreased caregiver support  Pt states that his wife is not able to assist much, stating that she is not in good health       Co-evaluation              End of Session Equipment Utilized During Treatment: Gait belt;Rolling walker;Other (comment) (R LE boot) Nurse Communication: Mobility status;Other (comment) (Pt reports hallucinations; RN/MD made aware)  Activity Tolerance: Patient tolerated treatment well Patient left: in chair;with call bell/phone within reach;with chair alarm set;with nursing/sitter in room   Time: 0815-0859 OT Time Calculation (min): 44 min Charges:  OT General Charges $OT Visit: 1 Procedure OT Evaluation $Initial OT Evaluation Tier I: 1 Procedure OT Treatments $Therapeutic Activity: 23-37 mins (25 min) G-Codes:    Roselie Awkward Dixon, OTR/L 10/16/2014, 9:56 AM

## 2014-10-16 NOTE — Progress Notes (Signed)
Pt refuses to wear CPAP. Pt encouraged to call RT if pt changes mind. No distress noted. 

## 2014-10-16 NOTE — Progress Notes (Signed)
Patient Demographics  [name] [name], is a 81 y.o. male, DOB - 04-06-1932, ZOX:[id number]  Admit date - 10/15/2014   Admitting Physician Eddie North, MD  Outpatient Primary MD for the patient is FULP, CAMMIE, MD  LOS - 1   Chief Complaint  Patient presents with  . Loss of Consciousness      Admission history of present illness/brief narrative:  [name] [name] is a 81 y.o. male, with a history of afib, OSA, emphysema, hypothyroidism, craniotomy/aneurysm clip and severe aortic regurgitation. with recent cardiac work up for aortic repair at [facility].  Presents with Syncope and collapse. He was discharged from [facility] recently and admitted to rehab to gain strength prior to surgery.Patient recent admission to [facility] was secondary to presyncope and syncope, [name] reports falling down over 20x in the past year. He states he will have a momentary period of visual disturbance and then will unexpectedly fall or run into something - such as a door frame. Over the past 6 days he reports an increasing cough and he has developed "lung pain". His son reports the patient has been confused and slightly agitated recently. On 2/15 he fell and was on the floor for several hours. This morning at approximately 2:00 am, he got up then fell and was out for only a short period.   In the ER [name] was orthostatic on exam. SBP 160 lying and 125 when standing. He has acute renal failure with a creatinine of 1.99 compared to baseline of approximately 1.5. His BNP is minimally elevated as is his troponin (0.04). Digoxin level is normal (0.8). CT head and spine do not show acute changes. Xray revealed a fracture of the right proximal fibular diaphysis. Dr. Dion Saucier was consulted.  Subjective:   [name] [name] today has, No headache, No chest pain, No abdominal pain - No Nausea, No new  weakness tingling or numbness, reports SOME visual hallucinations. Assessment & Plan    Principal Problem:   Episodes of formed visual hallucinations Active Problems:   Atrial fibrillation, chronic   Hypothyroidism   Hypertension   Hypercholesteremia   Aortic valve insufficiency   CAD (coronary artery disease), native coronary artery   Falls frequently   Syncope   Acute on chronic renal failure   Right fibular fracture   Hypoxia   Syncope and collapse  Syncope: - Multiple possible etiologies: Orthostasis, Dehydration, Polypharmacy and cardiac - This is most likely related to his severe aortic regurgitation, recent echo done at [facility] showing EF 50%, severe AR - Patient was orthostatic initially -[facility]ology. - Carotid Doppler showing Right: 40-59% ICA stenosis. Left: Common and internal carotid artery not insonated. Bilateral: Vertebral artery flow is antegrade. Will need repeat carotid Doppler in 6 months as discussed with Dr. Arbie Cookey from vascular surgery.  Acute on chronic renal failure - Baseline creatinine is 1.5, was 1.9 on admission, improving with hydration - Continue to hold Lasix and lisinopril - Secondary to rhabdo.  Rhabdomyolysis - Continue with IV fluid, hold nephrotoxic medication.  Abnormal carotid Doppler: - Carotid Doppler showing Right: 40-59% ICA stenosis. Left: Common and internal carotid artery not insonated. Bilateral: Vertebral artery flow is antegrade. Will need repeat carotid Doppler in 6 months as discussed with Dr. Arbie Cookey  from vascular surgery. - We'll hold on CT head and neck angiogram , given poor kidney function, and rhabdomyolysis   Proximal Fibula Fracture. Dr. Dion Saucier consulted. Boot placed. Recommend rehab.  AFib. Rate controlled. On eliquis  Hypothyroidism.  Continue Synthroid. TSH WNL.  Orthostatic Hypotension Hold lasix and lisinopril. Give IVF. Will continue Digoxin, Avodart and diltiazem.  Consider further decreases of BP meds if he remains orthostatic after hydration.  Depression/Psychotropics On lamictal, klonopin, Eszopiclone, Lexapro, buspar, wellbutrin. May consider psych consultation   OSA Will order CPAP QHS.  Urine culture  Per EDP urine had a strong odor and a U/A and culture was ordered.  U/A appeared negative for infection - other than some RBCs. Please follow culture.  Coronary artery disease On ASA  Cough and "lung pain" -From the notes it appears the patient has a chronic cough. - troponins 0.04>> 0.05>> 0.04 -Check CXR  Visual hallucinations - Psychiatry consult appreciated, no change in medications  Code Status: Partial  Family Communication: spoke with son  Disposition Plan: SNF when stable   Procedures  none   Consults   cardiology   Medications  Scheduled Meds: . apixaban  2.5 mg Oral BID  . aspirin EC  81 mg Oral Daily  . atorvastatin  10 mg Oral q1800  . buPROPion  300 mg Oral Daily  . busPIRone  15 mg Oral BID  . cholecalciferol  2,000 Units Oral Daily  . digoxin  0.125 mg Oral Daily  . diltiazem  120 mg Oral Daily  . docusate sodium  100 mg Oral BID  . dutasteride  0.5 mg Oral Daily  . escitalopram  20 mg Oral Daily  . fluticasone  1 spray Each Nare Daily  . lamoTRIgine  100 mg Oral BID  . levothyroxine  150 mcg Oral QAC breakfast  . omega-3 acid ethyl esters  2 g Oral BID  . sodium chloride  3 mL Intravenous Q12H   Continuous Infusions:  PRN Meds:.acetaminophen **OR** acetaminophen, albuterol, alum & mag hydroxide-simeth, calcium carbonate, clonazePAM, morphine injection, ondansetron **OR** ondansetron (ZOFRAN) IV, oxyCODONE  DVT Prophylaxis on Eliquis  Lab Results  Component Value Date   PLT 193 10/15/2014    Antibiotics    Anti-infectives    None          Objective:   Filed Vitals:   10/15/14 2003 10/16/14 0544 10/16/14 1018 10/16/14 1245  BP: 155/49 146/44 143/54 157/54  Pulse: 69 66 66 72   Temp: 98.3 F (36.8 C) 97.8 F (36.6 C) 97.8 F (36.6 C) 98.4 F (36.9 C)  TempSrc: Oral Oral Oral Oral  Resp: Weight:  117.5 kg (259 lb 0.7 oz)    SpO2: 97% 94% 95% 90%    Wt Readings from Last 3 Encounters:  10/16/14 117.5 kg (259 lb 0.7 oz)  06/20/14 120.385 kg (265 lb 6.4 oz)  04/29/14 115.667 kg (255 lb)     Intake/Output Summary (Last 24 hours) at 10/16/14 1439 Last data filed at 10/16/14 1230  Gross per 24 hour  Intake 1673.75 ml  Output      0 ml  Net 1673.75 ml     Physical Exam  Awake Alert, Oriented , No new F.N deficits, Normal affect [place].AT,PERRAL Supple Neck,No JVD, No cervical lymphadenopathy appriciated.  Symmetrical Chest wall movement, Good air movement bilaterally, CTAB Irregular,No Gallops,Rubs , No Parasternal Heave +ve B.Sounds, Abd Soft, No tenderness, No organomegaly appriciated, No rebound - guarding or rigidity. No Cyanosis, Clubbing  or edema, No new Rash or bruise     Data Review   Micro Results Recent Results (from the past 240 hour(s))  Urine culture     Status: None   Collection Time: 10/15/14  5:03 AM  Result Value Ref Range Status   Specimen Description URINE, CATHETERIZED  Final   Special Requests NONE  Final   Colony Count NO GROWTH Performed at [facility]   Final   Culture NO GROWTH Performed at [facility]   Final   Report Status 10/16/2014 FINAL  Final    Radiology Reports Dg Lumbar Spine Complete  10/15/2014   CLINICAL DATA:  Larey Seat tonight.  EXAM: LUMBAR SPINE - COMPLETE 4+ VIEW  COMPARISON:  06/20/2014  FINDINGS: There is no evidence of lumbar spine fracture. Alignment is normal. Intervertebral disc spaces are maintained.  IMPRESSION: Negative.   Electronically Signed   By: Ellery Plunk M.D.   On: 10/15/2014 05:57   Dg Pelvis 1-2 Views  10/15/2014   CLINICAL DATA:  Fall, loss of consciousness, pain.  EXAM: PELVIS - 1-2 VIEW  COMPARISON:  None.  FINDINGS: There is no evidence of  pelvic fracture or diastasis. No pelvic bone lesions are seen.  IMPRESSION: Negative.   Electronically Signed   By: Awilda Metro   On: 10/15/2014 05:56   Dg Ankle 2 Views Right  10/15/2014   CLINICAL DATA:  81 year old male with history of fall 1 week ago complaining of pain in the right ankle.  EXAM: RIGHT ANKLE - 2 VIEW  COMPARISON:  No priors.  FINDINGS: Two views of the right ankle demonstrate no acute displaced fracture, subluxation or dislocation. Numerous phleboliths are noted.  IMPRESSION: 1. No acute findings.   Electronically Signed   By: Trudie Reed M.D.   On: 10/15/2014 07:53   Ct Head Wo Contrast  10/15/2014   CLINICAL DATA:  Felt lightheaded and fell.  EXAM: CT HEAD WITHOUT CONTRAST  CT CERVICAL SPINE WITHOUT CONTRAST  TECHNIQUE: Multidetector CT imaging of the head and cervical spine was performed following the standard protocol without intravenous contrast. Multiplanar CT image reconstructions of the cervical spine were also generated.  COMPARISON:  06/20/2014  FINDINGS: CT HEAD FINDINGS  There is a left circle of Willis aneurysm clip. There is left frontal craniotomy. There is no intracranial hemorrhage, mass or evidence of acute infarction. There is no extra-axial fluid collection. There is moderate atrophy and chronic microvascular ischemic disease. There is no interval change from 06/20/2014. No acute bony abnormalities are evident. The calvarium and skullbase are intact. There is prior right maxillary antrectomy. There is resection of the right terminates.  CT CERVICAL SPINE FINDINGS  There are intact appearances of the vertebral column, pedicles and facet articulations. There is no cervical spine fracture. No acute soft tissue abnormalities are evident.  IMPRESSION: *Negative for acute intracranial traumatic injury. Moderate atrophy and chronic microvascular ischemic disease. *Negative for acute cervical spine fracture *Left circle of Willis aneurysm clip    Electronically Signed   By: Ellery Plunk M.D.   On: 10/15/2014 03:47   Ct Cervical Spine Wo Contrast  10/15/2014   CLINICAL DATA:  Felt lightheaded and fell.  EXAM: CT HEAD WITHOUT CONTRAST  CT CERVICAL SPINE WITHOUT CONTRAST  TECHNIQUE: Multidetector CT imaging of the head and cervical spine was performed following the standard protocol without intravenous contrast. Multiplanar CT image reconstructions of the cervical spine were also generated.  COMPARISON:  06/20/2014  FINDINGS: CT HEAD  FINDINGS  There is a left circle of Willis aneurysm clip. There is left frontal craniotomy. There is no intracranial hemorrhage, mass or evidence of acute infarction. There is no extra-axial fluid collection. There is moderate atrophy and chronic microvascular ischemic disease. There is no interval change from 06/20/2014. No acute bony abnormalities are evident. The calvarium and skullbase are intact. There is prior right maxillary antrectomy. There is resection of the right terminates.  CT CERVICAL SPINE FINDINGS  There are intact appearances of the vertebral column, pedicles and facet articulations. There is no cervical spine fracture. No acute soft tissue abnormalities are evident.  IMPRESSION: *Negative for acute intracranial traumatic injury. Moderate atrophy and chronic microvascular ischemic disease. *Negative for acute cervical spine fracture *Left circle of Willis aneurysm clip   Electronically Signed   By: Ellery Plunkaniel R Mitchell M.D.   On: 10/15/2014 03:47   Dg Knee Complete 4 Views Right  10/15/2014   CLINICAL DATA:  Larey SeatFell tonight  EXAM: RIGHT KNEE - COMPLETE 4+ VIEW  COMPARISON:  None.  FINDINGS: There is an oblique fracture of the proximal fibular diaphysis. No other acute fractures are evident. No acute soft tissue abnormalities are evident. Calcification adjacent to the medial femoral condyle likely represents sequela from old MCL trauma. Mild osteoarthritic changes are present.  IMPRESSION: Acute fracture of  the proximal fibular diaphysis   Electronically Signed   By: Ellery Plunkaniel R Mitchell M.D.   On: 10/15/2014 05:59    CBC  Recent Labs Lab 10/15/14 0300  WBC 6.4  HGB 12.4*  HCT 37.8*  PLT 193  MCV 94.5  MCH 31.0  MCHC 32.8  RDW 14.5    Chemistries   Recent Labs Lab 10/15/14 0300 10/16/14 0500  NA 140 140  K 3.5 3.5  CL 104 105  CO2 31 29  GLUCOSE 115* 104*  BUN 12 12  CREATININE 1.99* 1.60*  CALCIUM 8.8 8.5   ------------------------------------------------------------------------------------------------------------------ estimated creatinine clearance is 47.9 mL/min (by C-G formula based on Cr of 1.6). ------------------------------------------------------------------------------------------------------------------ No results for input(s): HGBA1C in the last 72 hours. ------------------------------------------------------------------------------------------------------------------ No results for input(s): CHOL, HDL, LDLCALC, TRIG, CHOLHDL, LDLDIRECT in the last 72 hours. ------------------------------------------------------------------------------------------------------------------  Recent Labs  10/15/14 1117  TSH 3.763   ------------------------------------------------------------------------------------------------------------------ No results for input(s): VITAMINB12, FOLATE, FERRITIN, TIBC, IRON, RETICCTPCT in the last 72 hours.  Coagulation profile No results for input(s): INR, PROTIME in the last 168 hours.  No results for input(s): DDIMER in the last 72 hours.  Cardiac Enzymes  Recent Labs Lab 10/15/14 1117 10/15/14 1430 10/15/14 2158  TROPONINI 0.04* 0.05* 0.04*   ------------------------------------------------------------------------------------------------------------------ Invalid input(s): POCBNP     Time Spent in minutes  35 minutes   Ravindra Baranek M.D on 10/16/2014 at 2:39 PM  Between 7am to 7pm - Pager - [phone]  After  7pm go to [email or web address] - password [email or web address]  And look for the night coverage person covering for me after hours  [facility] Office  [phone]   **Disclaimer: This note may have been dictated with voice recognition software. Similar sounding words can inadvertently be transcribed and this note may contain transcription errors which may not have been corrected upon publication of note.**

## 2014-10-16 NOTE — Clinical Social Work Placement (Addendum)
Clinical Social Work Department CLINICAL SOCIAL WORK PLACEMENT NOTE 10/16/2014  Patient:  [name] [name],[name] [name]  Account Number:  [id number] Admit date:  10/15/2014  Clinical Social Worker:  Merlyn LotJENNA HOLOMAN, CLINICAL SOCIAL WORKER  Date/time:  10/16/2014 02:49 PM  Clinical Social Work is seeking post-discharge placement for this patient at the following level of care:   SKILLED NURSING   (*CSW will update this form in Epic as items are completed)   10/16/2014  Patient/family provided with [facility] Department of Clinical Social Work's list of facilities offering this level of care within the geographic area requested by the patient (or if unable, by the patient's family).  10/16/2014  Patient/family informed of their freedom to choose among providers that offer the needed level of care, that participate in Medicare, Medicaid or managed care program needed by the patient, have an available bed and are willing to accept the patient.  10/16/2014  Patient/family informed of [facility]' ownership interest in [facility], as well as of the fact that they are under no obligation to receive care at this facility.  PASARR submitted to EDS on 10/16/2014 PASARR number received on 10/16/2014  FL2 transmitted to all facilities in geographic area requested by pt/family on  10/16/2014 FL2 transmitted to all facilities within larger geographic area on   Patient informed that his/her managed care company has contracts with or will negotiate with  certain facilities, including the following:     Patient/family informed of bed offers received:   [facility] Physician recommends and patient chooses bed at    [facility]  10/18/14 Patient to be transferred to facility by family Patient and family notified of transfer on 10/18/14 Name of family member notified:  Johnette Abrahamob Fullbright  The following physician request were entered in Epic:   Additional  Comments: Merlyn LotJenna Holoman, [facility] Clinical Social Worker [phone]

## 2014-10-16 NOTE — Consult Note (Signed)
CARDIOLOGY CONSULT NOTE   Patient ID: [name] [name] MRN: [id number] DOB/AGE: Sep 12, 1931 81 y.o.  Admit date: 10/15/2014  Primary Physician   Cain Saupe, MD Primary Cardiologist   Dr. Duard Brady ([facility]) Reason for Consultation   Syncope   HPI: [name] [name] is a 81 y.o. male with a history of chronic afib on Eliquis, HTN, OSA, emphysema, hypothyroidism, craniotomy/aneurysm clip and severe aortic regurgitation with CAD who presented to [facility] ED yesterday with syncope.   He is currently undergoing cardiac work up for AVR and CABG at [facility]. TTE on 09/18/2014 showed an LVEF 50%, LVIDs 4.4 cm, severe AR. RHC/LHC on 09/20/2014 showed a RA 11 RV 50/13 PA 47/25 (33) PCWP 18 CI 1.6 PVR 4.0 and a 70% distal RCA lesion. The decision was made to send him to a SNF given his frailty and have him re-evaluated in clinic by Dr. [name] for CABG+AVR. He was felt to be a poor TAVR candidate given the absence of Ca in his AoV annulus (CT Chest obtained while in house). He was discharged from [facility] on 09/23/14 and admitted to SNF to gain strength prior to surgery.He was unhappy in rehab after 1 week and moved back to his home with his wife. Per his son he has not been doing well since returning home. [name] [name] reports falling down over 20x in the past year. He states he will have a momentary period of visual disturbance and then will unexpectedly fall or run into something - such as a door frame.Over the last 4 days they have become more frequent, occuring at least once a day. They are not related to any position, sitting to standing or exertion. He "looses consciousness for a millisecond" becomes disoriented and the next thing he knows is that he is on the ground or falling down the stairs." His wife reports that he is disoriented for a few minutes after these episodes. He denies CP, SOB or palpitations. No lightheadedness or dizziness or nausea. No prodrome that he can pinpoint. On the night of admission he  got up from sleep to get something to eat (~2am) and then fell and was out for only a short period before calling his wife to come help him. He says that moving his head to one side sometimes makes it better. He was too weak to get up on his own. She then called EMS. Apparently the day prior he had had a fall and was left on the floor for approximately two hours.   In the ER [name] [name] was orthostatic on exam. SBP 160 lying and 125 when standing. He has acute renal failure with a creatinine of 1.99 compared to baseline of approximately 1.5. His BNP is minimally elevated as is his troponin (0.04). Digoxin level is normal (0.8). CT head and spine do not show acute changes. Xray revealed a fracture of the right proximal fibular diaphysis.Patient states that he had an episode yesterday while laying in the hospital bed. Tele with no events.    Past Medical History  Diagnosis Date  . Atrial fibrillation   . Hypothyroidism   . Plantar fasciitis   . Seasonal allergies   . Decreased testosterone level   . Varicose veins   . Hypertension   . Hypercholesteremia   . Aortic valve insufficiency   . Depression   . Heart murmur   . DVT (deep venous thrombosis) ~ 2013    "BLE"  . OSA (obstructive sleep apnea)     "  suppose to wear mask; I throw it off in my sleep" (10/15/2014)  . COPD (chronic obstructive pulmonary disease)   . Emphysema of lung   . History of blood transfusion 1960    "lots; related to an accident"  . Arthritis     "wee bit; knees, elbows" (10/15/2014)  . Anxiety   . Chronic kidney disease     "down to ~ 1/2 of their regular use; I see kidney dr. in [place]" (10/15/2014)  . Falls frequently     > 20 times in the last year/notes 10/15/2014  . Syncope and collapse "several times"     Past Surgical History  Procedure Laterality Date  . Abdominal aortic aneurysm repair  01/2006; 03/10/2006    Hattie Perch 01/12/2011  . Tonsillectomy    . Hernia repair    . Laparoscopic incisional /  umbilical / ventral hernia repair  02/2010    VHR w/mesh/notes 03/28/2010  . Laparoscopic cholecystectomy  08/2009    w/IOC/notes 09/18/2009  . Ercp  11/2009    Hattie Perch 12/05/2009  . Cardiac catheterization  1990's X 1; 09/2014  . Brain surgery  1960 X 4    "S/P got my skull busted"    No Known Allergies  I have reviewed the patient's current medications . apixaban  2.5 mg Oral BID  . aspirin EC  81 mg Oral Daily  . atorvastatin  10 mg Oral q1800  . buPROPion  300 mg Oral Daily  . busPIRone  15 mg Oral BID  . cholecalciferol  2,000 Units Oral Daily  . digoxin  0.125 mg Oral Daily  . diltiazem  120 mg Oral Daily  . docusate sodium  100 mg Oral BID  . dutasteride  0.5 mg Oral Daily  . escitalopram  20 mg Oral Daily  . fluticasone  1 spray Each Nare Daily  . lamoTRIgine  100 mg Oral BID  . levothyroxine  150 mcg Oral QAC breakfast  . omega-3 acid ethyl esters  2 g Oral BID  . sodium chloride  3 mL Intravenous Q12H     acetaminophen **OR** acetaminophen, albuterol, alum & mag hydroxide-simeth, calcium carbonate, clonazePAM, morphine injection, ondansetron **OR** ondansetron (ZOFRAN) IV, oxyCODONE  Prior to Admission medications   Medication Sig Start Date End Date Taking? Authorizing Provider  albuterol (PROVENTIL HFA;VENTOLIN HFA) 108 (90 BASE) MCG/ACT inhaler Inhale 2 puffs into the lungs every 6 (six) hours as needed for wheezing or shortness of breath.   Yes Historical Provider, MD  apixaban (ELIQUIS) 2.5 MG TABS tablet Take 1 tablet (2.5 mg total) by mouth 2 (two) times daily. 06/22/14  Yes Osvaldo Shipper, MD  aspirin 81 MG tablet Take 81 mg by mouth daily.   Yes Historical Provider, MD  atorvastatin (LIPITOR) 10 MG tablet Take 10 mg by mouth daily at 6 PM.   Yes Historical Provider, MD  b complex vitamins tablet Take 1 tablet by mouth daily.   Yes Historical Provider, MD  buPROPion (WELLBUTRIN XL) 300 MG 24 hr tablet Take 300 mg by mouth daily.   Yes Historical Provider, MD    busPIRone (BUSPAR) 15 MG tablet Take 15 mg by mouth 2 (two) times daily.   Yes Historical Provider, MD  calcium carbonate (TUMS - DOSED IN MG ELEMENTAL CALCIUM) 500 MG chewable tablet Chew 1 tablet by mouth as needed for indigestion or heartburn.   Yes Historical Provider, MD  cholecalciferol (VITAMIN D) 1000 UNITS tablet Take 2,000 Units by mouth daily.   Yes Historical Provider,  MD  clonazePAM (KLONOPIN) 0.5 MG tablet Take 0.5 mg by mouth at bedtime as needed for anxiety.   Yes Historical Provider, MD  digoxin (LANOXIN) 0.125 MG tablet Take 0.125 mg by mouth daily.   Yes Historical Provider, MD  diltiazem (TIAZAC) 120 MG 24 hr capsule Take 120 mg by mouth daily.   Yes Historical Provider, MD  dutasteride (AVODART) 0.5 MG capsule Take 0.5 mg by mouth daily.   Yes Historical Provider, MD  escitalopram (LEXAPRO) 20 MG tablet Take 20 mg by mouth daily.   Yes Historical Provider, MD  Eszopiclone (ESZOPICLONE) 3 MG TABS Take 3 mg by mouth at bedtime. Take immediately before bedtime   Yes Historical Provider, MD  fluticasone (FLONASE) 50 MCG/ACT nasal spray Place 1 spray into both nostrils daily. 06/22/14  Yes Osvaldo Shipper, MD  folic acid (FOLVITE) 1 MG tablet Take 1 mg by mouth daily.   Yes Historical Provider, MD  furosemide (LASIX) 40 MG tablet Take 40 mg by mouth.   Yes Historical Provider, MD  lamoTRIgine (LAMICTAL) 100 MG tablet Take 100 mg by mouth 2 (two) times daily. Breakfast and dinner   Yes Historical Provider, MD  levothyroxine (SYNTHROID, LEVOTHROID) 150 MCG tablet Take 150 mcg by mouth daily before breakfast.   Yes Historical Provider, MD  lisinopril (PRINIVIL,ZESTRIL) 40 MG tablet Take 40 mg by mouth daily.   Yes Historical Provider, MD  Melatonin 3 MG TABS Take 1 tablet by mouth at bedtime.   Yes Historical Provider, MD  naproxen sodium (ANAPROX) 220 MG tablet Take 220 mg by mouth 2 (two) times daily as needed (for pain).   Yes Historical Provider, MD  omega-3 acid ethyl esters  (LOVAZA) 1 G capsule Take 2 g by mouth 2 (two) times daily.   Yes Historical Provider, MD     History   Social History  . Marital Status: Married    Spouse Name: N/A  . Number of Children: N/A  . Years of Education: N/A   Occupational History  . Not on file.   Social History Main Topics  . Smoking status: Former Smoker -- 2.00 packs/day for 22 years    Types: Cigarettes    Quit date: 04/29/1974  . Smokeless tobacco: Never Used  . Alcohol Use: No  . Drug Use: No  . Sexual Activity: Not Currently   Other Topics Concern  . Not on file   Social History Narrative    No family status information on file.   Family History  Problem Relation Age of Onset  . Heart disease Father   . Rheum arthritis Mother   . Rheum arthritis Father   . Cancer Sister      ROS:  Full 14 point review of systems complete and found to be negative unless listed above.  Physical Exam: Blood pressure 157/54, pulse 72, temperature 98.4 F (36.9 C), temperature source Oral, resp. rate 18, weight 259 lb 0.7 oz (117.5 kg), SpO2 90 %.  General: Well developed, well nourished, male in no acute distress. Obese, chronically ill appearing Head: Eyes PERRLA, No xanthomas.   Normocephalic and atraumatic, oropharynx without edema or exudate. :  Lungs: CTAB Heart: HRRR S1 S2, no rub/gallop, Heart irregular rate and rhythm with S1, S2.  2/6 diastolic murmur RUSB. pulses are 2+ extrem.   Neck: No carotid bruits. No lymphadenopathy.  No JVD. Abdomen: Bowel sounds present, abdomen soft and non-tender without masses or hernias noted. Msk:  No spine or cva tenderness. No weakness, no joint  deformities or effusions. Extremities: No clubbing or cyanosis.  No edema. RLE with boot Neuro: Alert and oriented X 3. No focal deficits noted. Psych:  Good affect, responds appropriately Skin: No rashes or lesions noted.  Labs:   Lab Results  Component Value Date   WBC 6.4 10/15/2014   HGB 12.4* 10/15/2014   HCT 37.8*  10/15/2014   MCV 94.5 10/15/2014   PLT 193 10/15/2014   No results for input(s): INR in the last 72 hours.  Recent Labs Lab 10/16/14 0500  NA 140  K 3.5  CL 105  CO2 29  BUN 12  CREATININE 1.60*  CALCIUM 8.5  GLUCOSE 104*   No results found for: MG  Recent Labs  10/15/14 1117 10/15/14 1430 10/15/14 2158  CKTOTAL  --  1548*  --   TROPONINI 0.04* 0.05* 0.04*    Recent Labs  10/15/14 0307  TROPIPOC 0.03    TSH  Date/Time Value Ref Range Status  10/15/2014 11:17 AM 3.763 0.350 - 4.500 uIU/mL Final     Echo: ECHOCARDIOGRAPHIC MEASUREMENTS 2D DIMENSIONS AORTA Values Normal Range MAIN PA Values Normal Range Annulus: nm* cm [2.3 - 2.9] PA Main: nm* cm [1.5 - 2.1] Aorta Sin: 3.8 cm [3.1 - 3.7] RIGHT VENTRICLE ST Junction: nm* cm [2.6 - 3.2] RV Base: nm* cm [<= 4.2] Asc.Aorta: 4.0 cm [2.6 - 3.4] RV Mid: nm* cm [<= 3.5] LEFT VENTRICLE RV Length: nm* cm [<= 8.6] LVIDd: 6.0 cm [4.2 - 5.9] INFERIOR VENA CAVA LVIDs: 4.4 cm Max.IVC: nm* cm [<= 2.1] FS: 27% [> 25%] Min.IVC: nm* cm [<= 1.7] SWT: 1.1 cm [0.6 - 1.0] __________________ PWT: 1.0 cm [0.6 - 1.0] nm* - not measured LEFT ATRIUM LA Diam: 5.9 cm [3.0 - 4.0] LA Area: 39 cm2 [< 20] LA Volume: 151 mL [18 - 58]  ECHOCARDIOGRAPHIC DESCRIPTIONS AORTIC ROOT Size: DILATED Dissection: INDETERM FOR DISSECTION  AORTIC VALVE Leaflets: Tricuspid Morphology: MILDLY THICKENED Mobility: Fully Mobile  LEFT VENTRICLE Anterior: Normal Size: Normal Lateral: Normal Contraction: REGIONALLY IMPAIRED Septal: HYPOCONTRACTILE Closest EF: 50% (Estimated) Apical: Normal LV masses: No Masses Inferior: Normal LVH: None Posterior: Normal Dias.FxClass: Normal  MITRAL VALVE Leaflets: Normal Mobility: Fully mobile Morphology: ANNULAR CALC  LEFT ATRIUM Size: MODERATELY ENLARGED LA masses: No masses Normal IAS  MAIN PA Size: Normal  PULMONIC VALVE Morphology: Normal Mobility: Fully Mobile  RIGHT VENTRICLE Size: Normal  Free wall: Normal Contraction: Normal RV masses: No Masses TAPSE: 2.6 cm, Normal Range [>= 1.6 cm]  TRICUSPID VALVE Leaflets: Normal Mobility: Fully mobile Morphology: Normal  RIGHT ATRIUM Size: Normal RA Other: None RA masses: No masses  PERICARDIUM Fluid: No effusion  INFERIOR VENACAVA Size: Normal Normal respiratory collapse  DOPPLER ECHO and OTHER SPECIAL PROCEDURES Aortic: SEVERE AR No AS Mitral: No MR No MS MV Inflow E Vel.= nm* cm/s MV Annulus E'Vel.= nm* cm/s E/E'Ratio= nm* Tricuspid: TRIVIAL TR No TS Pulmonary: No PR No PS INTERPRETATION MILD LV DYSFUNCTION (See above) NORMAL LA PRESSURES WITH NORMAL DIASTOLIC FUNCTION NORMAL RIGHT VENTRICULAR SYSTOLIC FUNCTION VALVULAR REGURGITATION: SEVERE AR, TRIVIAL TR NO VALVULAR STENOSIS Compared with prior Echo study on 11/13/2013: LEFT VENTRICULAR DIMENSIONS ARE INCREASED; ASCENDING AORTA MEASURES LARGER BUT IS CONSISTENT WITH MEASUREMENT FROM 03/17/2012 STUDY.   ECG: HR 64 Atrial fibrillation Incomplete left bundle branch block LVH with secondary repolarization abnormality No significant change since last tracing   Radiology:  Dg Lumbar Spine Complete  10/15/2014   CLINICAL DATA:  Larey Seat tonight.  EXAM: LUMBAR SPINE - COMPLETE 4+  VIEW  COMPARISON:  06/20/2014  FINDINGS: There is no evidence of lumbar spine fracture. Alignment is normal. Intervertebral disc spaces are maintained.  IMPRESSION: Negative.   Electronically Signed   By: Ellery Plunk M.D.   On: 10/15/2014 05:57   Dg Pelvis 1-2 Views  10/15/2014   CLINICAL DATA:  Fall, loss of consciousness, pain.  EXAM: PELVIS - 1-2 VIEW  COMPARISON:  None.  FINDINGS: There is no evidence of pelvic fracture or diastasis. No pelvic bone lesions are seen.  IMPRESSION: Negative.   Electronically Signed   By: Awilda Metro   On: 10/15/2014 05:56   Dg Ankle 2 Views Right  10/15/2014   CLINICAL DATA:  81 year old male with history of fall 1 week ago complaining of  pain in the right ankle.  EXAM: RIGHT ANKLE - 2 VIEW  COMPARISON:  No priors.  FINDINGS: Two views of the right ankle demonstrate no acute displaced fracture, subluxation or dislocation. Numerous phleboliths are noted.  IMPRESSION: 1. No acute findings.   Electronically Signed   By: Trudie Reed M.D.   On: 10/15/2014 07:53   Ct Head Wo Contrast  10/15/2014   CLINICAL DATA:  Felt lightheaded and fell.  EXAM: CT HEAD WITHOUT CONTRAST  CT CERVICAL SPINE WITHOUT CONTRAST  TECHNIQUE: Multidetector CT imaging of the head and cervical spine was performed following the standard protocol without intravenous contrast. Multiplanar CT image reconstructions of the cervical spine were also generated.  COMPARISON:  06/20/2014  FINDINGS: CT HEAD FINDINGS  There is a left circle of Willis aneurysm clip. There is left frontal craniotomy. There is no intracranial hemorrhage, mass or evidence of acute infarction. There is no extra-axial fluid collection. There is moderate atrophy and chronic microvascular ischemic disease. There is no interval change from 06/20/2014. No acute bony abnormalities are evident. The calvarium and skullbase are intact. There is prior right maxillary antrectomy. There is resection of the right terminates.  CT CERVICAL SPINE FINDINGS  There are intact appearances of the vertebral column, pedicles and facet articulations. There is no cervical spine fracture. No acute soft tissue abnormalities are evident.  IMPRESSION: *Negative for acute intracranial traumatic injury. Moderate atrophy and chronic microvascular ischemic disease. *Negative for acute cervical spine fracture *Left circle of Willis aneurysm clip   Electronically Signed   By: Ellery Plunk M.D.   On: 10/15/2014 03:47   Ct Cervical Spine Wo Contrast  10/15/2014   CLINICAL DATA:  Felt lightheaded and fell.  EXAM: CT HEAD WITHOUT CONTRAST  CT CERVICAL SPINE WITHOUT CONTRAST  TECHNIQUE: Multidetector CT imaging of the head and cervical  spine was performed following the standard protocol without intravenous contrast. Multiplanar CT image reconstructions of the cervical spine were also generated.  COMPARISON:  06/20/2014  FINDINGS: CT HEAD FINDINGS  There is a left circle of Willis aneurysm clip. There is left frontal craniotomy. There is no intracranial hemorrhage, mass or evidence of acute infarction. There is no extra-axial fluid collection. There is moderate atrophy and chronic microvascular ischemic disease. There is no interval change from 06/20/2014. No acute bony abnormalities are evident. The calvarium and skullbase are intact. There is prior right maxillary antrectomy. There is resection of the right terminates.  CT CERVICAL SPINE FINDINGS  There are intact appearances of the vertebral column, pedicles and facet articulations. There is no cervical spine fracture. No acute soft tissue abnormalities are evident.  IMPRESSION: *Negative for acute intracranial traumatic injury. Moderate atrophy and chronic microvascular ischemic disease. *Negative for  acute cervical spine fracture *Left circle of Willis aneurysm clip   Electronically Signed   By: Ellery Plunkaniel R Mitchell M.D.   On: 10/15/2014 03:47   Dg Knee Complete 4 Views Right  10/15/2014   CLINICAL DATA:  Larey SeatFell tonight  EXAM: RIGHT KNEE - COMPLETE 4+ VIEW  COMPARISON:  None.  FINDINGS: There is an oblique fracture of the proximal fibular diaphysis. No other acute fractures are evident. No acute soft tissue abnormalities are evident. Calcification adjacent to the medial femoral condyle likely represents sequela from old MCL trauma. Mild osteoarthritic changes are present.  IMPRESSION: Acute fracture of the proximal fibular diaphysis   Electronically Signed   By: Ellery Plunkaniel R Mitchell M.D.   On: 10/15/2014 05:59    ASSESSMENT AND PLAN:    Principal Problem:   Episodes of formed visual hallucinations Active Problems:   Atrial fibrillation, chronic   Hypothyroidism   Hypertension    Hypercholesteremia   Aortic valve insufficiency   CAD (coronary artery disease), native coronary artery   Falls frequently   Syncope   Acute on chronic renal failure   Right fibular fracture   Hypoxia   Syncope and collapse  [name] [name] is a 81 y.o. male with a history of chronic afib on Eliquis, HTN, OSA, emphysema, hypothyroidism, craniotomy/aneurysm clip and severe aortic regurgitation with CAD who [facility] with [facility].   Syncope: - Multiple possible etiologies: Orthostasis, Dehydration, Polypharmacy and cardiac VS vertigo - This does not sound cardiac in nature and neurology consult may be necessary (sounds like it could be vertigo) - Patient was orthostatic initially, improving with IVFs - Carotid Doppler showing Right: 40-59% ICA stenosis. Left: Common and internal carotid artery not insonated. Bilateral: Vertebral artery flow is antegrade. Will need repeat carotid Doppler in 6 months as discussed with Dr. Arbie CookeyEarly from vascular surgery.  Acute on chronic renal failure - Baseline creatinine is 1.5, was 1.9 on admission, improving with hydration (1.6 today) - Continue to hold Lasix and lisinopril - Secondary to rhabdo.  Rhabdomyolysis - Continue with IV fluid, hold nephrotoxic medication.  Abnormal carotid Doppler: -- Carotid Doppler showing Right: 40-59% ICA stenosis. Left: Common and internal carotid artery not insonated. Bilateral: Vertebral artery flow is antegrade. Will need repeat carotid Doppler in 6 months as discussed with Dr. Arbie CookeyEarly from vascular surgery.  Proximal Fibula Fracture. -- Dr. Dion SaucierLandau consulted. Boot placed. Recommend rehab.  AFib. -- Rate  Well controlled.Continue eliquis  Hypothyroidism.  -- Continue Synthroid. TSH WNL.  Orthostatic Hypotension -- Hold lasix and lisinopril. Give IVF. Continued on Digoxin, Avodart and diltiazem.  Depression/Psychotropics -- On lamictal, klonopin, Eszopiclone,  Lexapro, buspar, wellbutrin.  OSA -- CPAP QHS.   Coronary artery disease -- 70% RCA stenosis. Referred for CABG with AVR. -- Troponins 0.04>> 0.05>> 0.04. Non specific in the setting of Rhabdo   Visual hallucinations - Psychiatry consulted, no change in medications  Signed: Allena KatzHOMPSON, KATHRYN R, PA-C 10/16/2014 3:22 PM  Pager [phone]  Co-Sign MD  Patient seen with PA, agree with the above note.  1. Syncope: Difficult to figure out his symptoms.  He seems to describe very brief episodes of loss of consciousness versus loss of balance.  If he is standing, he will fall.  He also says that if he turns his head a particular way, he can abort the episodes.  He was orthostatic at admission, but I think that this is likely a separate issue.  He has had no arrhythmic events on telemetry other than  chronic afib, but he did have one of the "spells" while lying in bed.  This was aborted by turning his head to one side.  He has fallen a number of times and has a resultant right fibular fracture.  I wonder if these episodes are positional vertigo with loss of balance and falls.   - Continue to monitor on telemetry. - I think he needs a neurology evaluation for ?positional vertigo or other noncardiac cause for his spells.  2. Severe aortic insufficiency: Most recent echo in 1/16 at [facility] with EF 50% and severe AI.  No significant AS.  He was not thought to be a good TAVR candidate at [facility]. He has a diastolic murmur.  At this point, he is not a very good AVR candidate with recent fibular fracture.  This needs to heal and he will likely need physical therapy to get his strength up prior to operation.  He is thinking about trying to have any procedure done in [place].  We could reassess him for TAVR as well.  3. Orthostatic hypotension: Hold Lasix and lisinopril for now.  4. AKI: Holding Lasix and lisinopril.  5. Atrial fibrillation: Chronic.  Continue Eliquis and diltiazem CD.    Marca Ancona    10/16/2014 4:55 PM

## 2014-10-16 NOTE — Clinical Social Work Psychosocial (Signed)
Clinical Social Work Department BRIEF PSYCHOSOCIAL ASSESSMENT 10/16/2014  Patient:  [name] [name],[name] [name]     Account Number:  [id number]     Admit date:  10/15/2014  Clinical Social Worker:  Merlyn LotHOLOMAN,Prather Failla, CLINICAL SOCIAL WORKER  Date/Time:  10/16/2014 02:46 PM  Referred by:  Physician  Date Referred:  10/16/2014 Referred for  SNF Placement   Other Referral:   Interview type:  Patient Other interview type:    PSYCHOSOCIAL DATA Living Status:  WIFE Admitted from facility:   Level of care:   Primary support name:  [name] [name] Primary support relationship to patient:  SPOUSE Degree of support available:   high level of support- wife at bedside    CURRENT CONCERNS Current Concerns  Post-Acute Placement   Other Concerns:    SOCIAL WORK ASSESSMENT / PLAN CSW spoke with patient and wife at bedside concerning SNF placement- patient and wife are agreeable to SNF for short term rehab and state that patient has been [facility] the [facility]- patient is agreeable to return to [facility] at time of DC.  CSW will continue to follow.   Assessment/plan status:  Psychosocial Support/Ongoing Assessment of Needs Other assessment/ plan:   FL2   Information/referral to community resources:    PATIENT'S/FAMILY'S RESPONSE TO PLAN OF CARE: Patient and wife are agreeable to plan for patient to go to rehab for a few weeks and are hopeful that rehab will strengthen him enough to return home.       Merlyn LotJenna Holoman, LCSWA Clinical Social Worker [phone]

## 2014-10-16 NOTE — Progress Notes (Signed)
[facility]  Patient Status: Active (receiving services up to time of hospitalization)  [facility] is providing the following services: PT and OT  If patient discharges after hours, please call [phone].   [name] BourgeoisMarie [name] 10/16/2014, 11:33 AM

## 2014-10-16 NOTE — Consult Note (Signed)
[facility] Face-to-Face Psychiatry Consult   Reason for Consult:  Visual hallucination Referring Physician:  Dr. Randol Kern  Patient Identification: [name] [name] MRN:  [id number] Principal Diagnosis: Episodes of formed visual hallucinations Diagnosis:   Patient Active Problem List   Diagnosis Date Noted  . Episodes of formed visual hallucinations [H53.16] 10/16/2014  . Syncope [R55] 10/15/2014  . Acute on chronic renal failure [N17.9, N18.9] 10/15/2014  . Right fibular fracture [S82.401A] 10/15/2014  . Hypoxia [R09.02] 10/15/2014  . Syncope and collapse [R55] 10/15/2014  . CAD (coronary artery disease), native coronary artery [I25.10] 09/29/2014  . Falls frequently [R29.6] 09/29/2014  . Hypertension [I10]   . Hypercholesteremia [E78.0]   . Aortic valve insufficiency [I35.1]   . Depression [F32.9]   . Atrial fibrillation, chronic [I48.2] 06/20/2014  . Hypothyroidism [E03.9] 06/20/2014  . DOE (dyspnea on exertion) [R06.09] 06/20/2014  . Cough [R05] 04/29/2014  . Shortness of breath [R06.02] 04/29/2014    Total Time spent with patient: 45 minutes  Subjective:   [name] [name] is a 81 y.o. male patient admitted with Visual hallucination.  HPI:  [name] [name] is a 81 y.o. male, seen, chart reviewed for psychiatric consultation and evaluation of episodic visual hallucinations. Patient has been compliant with his medication without adverse affects. Patient reported he feels like he see people, animals and sometimes something else also from the corners of his eyes when he is reading. Patient reported he has no history of depression, anxiety, schizophrenia, suicidal, homicidal ideation, intention or plans. Patient is also reported his wife has been diagnosed with bipolar disorder and has been receiving treatment. 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 Patient reported he has syncopal  episodes especially when he is walking with his dog and scared of getting on steps into his home. Patient reported he has a plans of developing the Railing and using a cane in near future.   Medical history: Patient with multiple medical problems including history of afib, OSA, emphysema, hypothyroidism, craniotomy/aneurysm clip and severe aortic regurgitation. He is currently undergoing cardiac work up for aortic repair at [facility]. He presents to [facility] today with Syncope and collapse. He was discharged from [facility] recently and admitted to rehab to gain strength prior to surgery. He was unhappy in rehab after 1 week and moved back to his home with his wife. Per his son he has not been doing well since returning home. [name] [name] reports falling down over 20x in the past year. He states he will have a momentary period of visual disturbance and then will unexpectedly fall or run into something - such as a door frame. Over the past 6 days he reports an increasing cough and he has developed "lung pain". His son reports the patient has been confused and slightly agitated recently. On 2/15 he fell and was on the floor for several hours. This morning at approximately 2:00 am, he got up then fell and was out for only a short period.   In the ER [name] [name] was orthostatic on exam. SBP 160 lying and 125 when standing. He has acute renal failure with a creatinine of 1.99 compared to baseline of approximately 1.5. His BNP is minimally elevated as is his troponin (0.04). Digoxin level is normal (0.8). CT head and spine do not show acute changes. Xray revealed a fracture of the right proximal fibular diaphysis. Dr. Dion Saucier was consulted.  HPI Elements:  Location:  Visual hallucinations. Quality:  Episodic. Severity:  Does not bother him or stress him. Timing:  Associated with the syncopal episodes or reading books.  Past Medical History:  Past Medical History  Diagnosis Date  . Atrial fibrillation   .  Hypothyroidism   . Plantar fasciitis   . Seasonal allergies   . Decreased testosterone level   . Varicose veins   . Hypertension   . Hypercholesteremia   . Aortic valve insufficiency   . Depression   . Heart murmur   . DVT (deep venous thrombosis) ~ 2013    "BLE"  . OSA (obstructive sleep apnea)     "suppose to wear mask; I throw it off in my sleep" (10/15/2014)  . COPD (chronic obstructive pulmonary disease)   . Emphysema of lung   . History of blood transfusion 1960    "lots; related to an accident"  . Arthritis     "wee bit; knees, elbows" (10/15/2014)  . Anxiety   . Chronic kidney disease     "down to ~ 1/2 of their regular use; I see kidney dr. in [place]" (10/15/2014)  . Falls frequently     > 20 times in the last year/notes 10/15/2014  . Syncope and collapse "several times"    Past Surgical History  Procedure Laterality Date  . Abdominal aortic aneurysm repair  01/2006; 03/10/2006    Hattie Perch/notes 01/12/2011  . Tonsillectomy    . Hernia repair    . Laparoscopic incisional / umbilical / ventral hernia repair  02/2010    VHR w/mesh/notes 03/28/2010  . Laparoscopic cholecystectomy  08/2009    w/IOC/notes 09/18/2009  . Ercp  11/2009    Hattie Perch/notes 12/05/2009  . Cardiac catheterization  1990's X 1; 09/2014  . Brain surgery  1960 X 4    "S/P got my skull busted"   Family History:  Family History  Problem Relation Age of Onset  . Heart disease Father   . Rheum arthritis Mother   . Rheum arthritis Father   . Cancer Sister    Social History:  History  Alcohol Use No     History  Drug Use No    History   Social History  . Marital Status: Married    Spouse Name: N/A  . Number of Children: N/A  . Years of Education: N/A   Social History Main Topics  . Smoking status: Former Smoker -- 2.00 packs/day for 22 years    Types: Cigarettes    Quit date: 04/29/1974  . Smokeless tobacco: Never Used  . Alcohol Use: No  . Drug Use: No  . Sexual Activity: Not Currently   Other Topics  Concern  . None   Social History Narrative   Additional Social History:     Allergies:  No Known Allergies  Vitals: Blood pressure 143/54, pulse 66, temperature 97.8 F (36.6 C), temperature source Oral, resp. rate 18, weight 117.5 kg (259 lb 0.7 oz), SpO2 95 %.  Risk to Self: Is patient at risk for suicide?: No Risk to Others:   Prior Inpatient Therapy:   Prior Outpatient Therapy:    Current Facility-Administered Medications  Medication Dose Route Frequency Provider Last Rate Last Dose  . acetaminophen (TYLENOL) tablet 650 mg  650 mg Oral Q6H PRN Stephani PoliceMarianne L York, PA-C       Or  . acetaminophen (TYLENOL) suppository 650 mg  650 mg Rectal Q6H PRN Tora KindredMarianne L York, PA-C      . albuterol (PROVENTIL) (2.5 MG/3ML)  0.083% nebulizer solution 2.5 mg  2.5 mg Nebulization Q6H PRN Darl Householder Masters, [facility]      . alum & mag hydroxide-simeth (MAALOX/MYLANTA) 200-200-20 MG/5ML suspension 30 mL  30 mL Oral Q6H PRN Stephani Police, PA-C      . apixaban (ELIQUIS) tablet 2.5 mg  2.5 mg Oral BID Stephani Police, PA-C   2.5 mg at 10/16/14 1022  . aspirin EC tablet 81 mg  81 mg Oral Daily Stephani Police, PA-C   81 mg at 10/16/14 1021  . atorvastatin (LIPITOR) tablet 10 mg  10 mg Oral q1800 Stephani Police, PA-C   10 mg at 10/15/14 1725  . buPROPion (WELLBUTRIN XL) 24 hr tablet 300 mg  300 mg Oral Daily Stephani Police, PA-C   300 mg at 10/16/14 1021  . busPIRone (BUSPAR) tablet 15 mg  15 mg Oral BID Stephani Police, PA-C   15 mg at 10/16/14 1022  . calcium carbonate (TUMS - dosed in mg elemental calcium) chewable tablet 200 mg of elemental calcium  1 tablet Oral PRN Stephani Police, PA-C      . cholecalciferol (VITAMIN D) tablet 2,000 Units  2,000 Units Oral Daily Stephani Police, PA-C   2,000 Units at 10/16/14 1021  . clonazePAM (KLONOPIN) tablet 0.5 mg  0.5 mg Oral BID BM & HS PRN Stephani Police, PA-C      . digoxin (LANOXIN) tablet 0.125 mg  0.125 mg Oral Daily Stephani Police, PA-C   0.125 mg at  10/16/14 1021  . diltiazem (TIAZAC) 24 hr capsule 120 mg  120 mg Oral Daily Stephani Police, PA-C   120 mg at 10/16/14 1022  . docusate sodium (COLACE) capsule 100 mg  100 mg Oral BID Stephani Police, PA-C   100 mg at 10/16/14 1021  . dutasteride (AVODART) capsule 0.5 mg  0.5 mg Oral Daily Stephani Police, PA-C   0.5 mg at 10/16/14 1021  . escitalopram (LEXAPRO) tablet 20 mg  20 mg Oral Daily Stephani Police, PA-C   20 mg at 10/16/14 1022  . fluticasone (FLONASE) 50 MCG/ACT nasal spray 1 spray  1 spray Each Nare Daily Stephani Police, PA-C   1 spray at 10/16/14 1026  . lamoTRIgine (LAMICTAL) tablet 100 mg  100 mg Oral BID Stephani Police, PA-C   100 mg at 10/16/14 1021  . levothyroxine (SYNTHROID, LEVOTHROID) tablet 150 mcg  150 mcg Oral QAC breakfast Stephani Police, PA-C   150 mcg at 10/16/14 1610  . morphine 2 MG/ML injection 1 mg  1 mg Intravenous Q3H PRN Stephani Police, PA-C      . omega-3 acid ethyl esters (LOVAZA) capsule 2 g  2 g Oral BID Stephani Police, PA-C   2 g at 10/16/14 1023  . ondansetron (ZOFRAN) tablet 4 mg  4 mg Oral Q6H PRN Stephani Police, PA-C       Or  . ondansetron [facility]) injection 4 mg  4 mg Intravenous Q6H PRN Stephani Police, PA-C      . oxyCODONE (Oxy IR/ROXICODONE) immediate release tablet 5 mg  5 mg Oral Q4H PRN Stephani Police, PA-C   5 mg at 10/16/14 9604  . sodium chloride 0.9 % injection 3 mL  3 mL Intravenous Q12H Stephani Police, PA-C   3 mL at 10/16/14 1023    Musculoskeletal: Strength & Muscle Tone: decreased Gait & Station: unable to stand Patient leans: N/A  Psychiatric Specialty Exam: Physical Exam as per the history and physical   ROS anxiety and visual hallucinations   Blood pressure 143/54, pulse 66, temperature 97.8 F (36.6 C), temperature source Oral, resp. rate 18, weight 117.5 kg (259 lb 0.7 oz), SpO2 95 %.Body mass index is 35.12 kg/(m^2).  General Appearance: Casual  Eye Contact::  Good  Speech:  Clear and Coherent  Volume:  Normal   Mood:  Anxious  Affect:  Appropriate and Congruent  Thought Process:  Coherent and Goal Directed  Orientation:  Full (Time, Place, and Person)  Thought Content:  WDL  Suicidal Thoughts:  No  Homicidal Thoughts:  No  Memory:  Immediate;   Good Recent;   Good  Judgement:  Intact  Insight:  Good  Psychomotor Activity:  Decreased  Concentration:  Good  Recall:  Good  Fund of Knowledge:Good  Language: Good  Akathisia:  NA  Handed:  Right  AIMS (if indicated):     Assets:  Communication Skills Desire for Improvement Financial Resources/Insurance Housing Intimacy Leisure Time Resilience Social Support Talents/Skills Transportation  ADL's:  Intact  Cognition: WNL  Sleep:      Medical Decision Making: Review of Psycho-Social Stressors (1), Review or order clinical lab tests (1), Established Problem, Worsening (2), Review of Last Therapy Session (1), Review of Medication Regimen & Side Effects (2) and Review of New Medication or Change in Dosage (2)  Treatment Plan Summary: Daily contact with patient to assess and evaluate symptoms and progress in treatment and Medication management  Plan: Continue medication management Wellbutrin XL 300 mg daily and BuSpar 15 mg twice daily for anxiety disorder and has not required antipsychotic medications. May discontinue medication that causes anticholinergic affects Patient does not meet criteria for psychiatric inpatient admission. Supportive therapy provided about ongoing stressors.   Disposition: May BE discharged home when medically stable and does not required psychiatric services at this time  [facility] R. 10/16/2014 11:38 AM

## 2014-10-17 ENCOUNTER — Inpatient Hospital Stay (HOSPITAL_COMMUNITY): Payer: Medicare Other

## 2014-10-17 DIAGNOSIS — I10 Essential (primary) hypertension: Secondary | ICD-10-CM

## 2014-10-17 LAB — BASIC METABOLIC PANEL
Anion gap: 7 (ref 5–15)
BUN: 13 mg/dL (ref 6–23)
CHLORIDE: 107 mmol/L (ref 96–112)
CO2: 25 mmol/L (ref 19–32)
Calcium: 8.6 mg/dL (ref 8.4–10.5)
Creatinine, Ser: 1.64 mg/dL — ABNORMAL HIGH (ref 0.50–1.35)
GFR, EST AFRICAN AMERICAN: 44 mL/min — AB (ref >90)
GFR, EST NON AFRICAN AMERICAN: 38 mL/min — AB (ref >90)
Glucose, Bld: 110 mg/dL — ABNORMAL HIGH (ref 70–99)
POTASSIUM: 3.8 mmol/L (ref 3.5–5.1)
SODIUM: 139 mmol/L (ref 135–145)

## 2014-10-17 LAB — CBC
HCT: 33.2 % — ABNORMAL LOW (ref 39.0–52.0)
Hemoglobin: 11.1 g/dL — ABNORMAL LOW (ref 13.0–17.0)
MCH: 31.5 pg (ref 26.0–34.0)
MCHC: 33.4 g/dL (ref 30.0–36.0)
MCV: 94.3 fL (ref 78.0–100.0)
Platelets: 175 10*3/uL (ref 150–400)
RBC: 3.52 MIL/uL — ABNORMAL LOW (ref 4.22–5.81)
RDW: 14.5 % (ref 11.5–15.5)
WBC: 5.7 10*3/uL (ref 4.0–10.5)

## 2014-10-17 MED ORDER — SODIUM CHLORIDE 0.9 % IV SOLN
INTRAVENOUS | Status: AC
  Administered 2014-10-17 (×2): via INTRAVENOUS

## 2014-10-17 NOTE — Progress Notes (Signed)
Patient Demographics  [name] [name], is a 81 y.o. male, DOB - 10-Jul-1932, EAV:[id number]  Admit date - 10/15/2014   Admitting Physician Eddie North, MD  Outpatient Primary MD for the patient is FULP, CAMMIE, MD  LOS - 2   Chief Complaint  Patient presents with  . Loss of Consciousness      Admission history of present illness/brief narrative:  [name] [name] is a 81 y.o. male, with a history of afib, OSA, emphysema, hypothyroidism, craniotomy/aneurysm clip and severe aortic regurgitation. with recent cardiac work up for aortic repair at [facility].  Presents with Syncope and collapse. He was discharged from [facility] recently and admitted to rehab to gain strength prior to surgery.Patient recent admission to [facility] was secondary to presyncope and syncope, [name] [name] reports falling down over 20x in the past year. He states he will have a momentary period of visual disturbance and then will unexpectedly fall or run into something - such as a door frame. Over the past 6 days he reports an increasing cough and he has developed "lung pain". His son reports the patient has been confused and slightly agitated recently. On 2/15 he fell and was on the floor for several hours. This morning at approximately 2:00 am, he got up then fell and was out for only a short period.   In the ER [name] [name] was orthostatic on exam. SBP 160 lying and 125 when standing. He has acute renal failure with a creatinine of 1.99 compared to baseline of approximately 1.5. His BNP is minimally elevated as is his troponin (0.04). Digoxin level is normal (0.8). CT head and spine do not show acute changes. Xray revealed a fracture of the right proximal fibular diaphysis. Dr. Dion Saucier was consulted. Patient is known to have history of severe aortic regurgitation, she's been followed at [facility], with recent  hospitalization they are 09/18/14, secondary to presyncope/syncope thought to be secondary to his aortic regurgitation.   Subjective:   [name] [name] today has, No headache, No chest pain, No abdominal pain - No Nausea, No new weakness tingling or numbness . Assessment & Plan    Principal Problem:   Episodes of formed visual hallucinations Active Problems:   Atrial fibrillation, chronic   Hypothyroidism   Hypertension   Hypercholesteremia   Aortic valve insufficiency   CAD (coronary artery disease), native coronary artery   Falls frequently   Syncope   Acute on chronic renal failure   Right fibular fracture   Hypoxia   Syncope and collapse   Faintness  Syncope: - Multiple possible etiologies: Orthostasis, Dehydration, Polypharmacy and cardiac, versus vertigo . - This is most likely related to his severe aortic regurgitation, recent echo done at [facility] showing EF 50%, severe AR - Patient was orthostatic initially on admission  - Cardiology consult appreciated, they think cardiovascular etiologies are unlikely. - Neurology consult appreciated, will obtain EEG. - carotid Doppler showing Right: 40-59% ICA stenosis. Left: Common and internal carotid artery not insonated. Bilateral: Vertebral artery flow is antegrade. Will need repeat carotid Doppler in 6 months as discussed with Dr. Arbie Cookey from vascular surgery.  Acute on chronic renal failure - Baseline creatinine is 1.5, was 1.9 on admission, improving with hydration - Continue to hold Lasix and lisinopril -  Secondary to rhabdo. continue with gentle hydration .   Rhabdomyolysis - Continue with IV fluid, hold nephrotoxic medication, creatinine improving .  Abnormal carotid Doppler: - Carotid Doppler showing Right: 40-59% ICA stenosis. Left: Common and internal carotid artery not insonated. Bilateral: Vertebral artery flow is antegrade. Will need repeat carotid Doppler in 6 months as discussed with Dr. Arbie Cookey  from vascular surgery. - We'll hold on CT head and neck angiogram , given poor kidney function, and  patient is recovering from rhabdomyolysis   Proximal Fibula Fracture. Dr. Dion Saucier consulted. Boot placed. Recommend rehab.  AFib. Rate controlled. On eliquis  Hypothyroidism.  Continue Synthroid. TSH WNL.  Orthostatic Hypotension Hold lasix and lisinopril. Give IVF. Will continue Digoxin, Avodart and diltiazem. Consider further decreases of BP meds if he remains orthostatic after hydration.  Depression/Psychotropics On lamictal, klonopin, Eszopiclone, Lexapro, buspar, wellbutrin. May consider psych consultation   OSA Will order CPAP QHS.  Urine culture  Per EDP urine had a strong odor and a U/A and culture was ordered.  U/A appeared negative for infection - other than some RBCs. Please follow culture.  Coronary artery disease On ASA  Cough and "lung pain" -From the notes it appears the patient has a chronic cough. - troponins 0.04>> 0.05>> 0.04 -Check CXR  Visual hallucinations - Psychiatry consult appreciated, no change in medications  Code Status: Partial  Family Communication: spoke with son  Disposition Plan: SNF when stable   Procedures  none   Consults   cardiology   Medications  Scheduled Meds: . apixaban  2.5 mg Oral BID  . aspirin EC  81 mg Oral Daily  . atorvastatin  10 mg Oral q1800  . buPROPion  300 mg Oral Daily  . busPIRone  15 mg Oral BID  . cholecalciferol  2,000 Units Oral Daily  . digoxin  0.125 mg Oral Daily  . diltiazem  120 mg Oral Daily  . docusate sodium  100 mg Oral BID  . dutasteride  0.5 mg Oral Daily  . escitalopram  20 mg Oral Daily  . fluticasone  1 spray Each Nare Daily  . lamoTRIgine  100 mg Oral BID  . levothyroxine  150 mcg Oral QAC breakfast  . omega-3 acid ethyl esters  2 g Oral BID  . sodium chloride  3 mL Intravenous Q12H   Continuous Infusions:  PRN Meds:.acetaminophen **OR** acetaminophen,  albuterol, alum & mag hydroxide-simeth, calcium carbonate, clonazePAM, morphine injection, ondansetron **OR** ondansetron (ZOFRAN) IV, oxyCODONE  DVT Prophylaxis on Eliquis  Lab Results  Component Value Date   PLT 175 10/17/2014    Antibiotics    Anti-infectives    None          Objective:   Filed Vitals:   10/16/14 1018 10/16/14 1245 10/16/14 1955 10/17/14 0500  BP: 143/54 157/54 169/74 169/59  Pulse: 66 72 72 75  Temp: 97.8 F (36.6 C) 98.4 F (36.9 C) 97.9 F (36.6 C) 98.1 F (36.7 C)  TempSrc: Oral Oral Oral Oral  Resp: Weight:    117.2 kg (258 lb 6.1 oz)  SpO2: 95% 90% 92% 95%    Wt Readings from Last 3 Encounters:  10/17/14 117.2 kg (258 lb 6.1 oz)  06/20/14 120.385 kg (265 lb 6.4 oz)  04/29/14 115.667 kg (255 lb)     Intake/Output Summary (Last 24 hours) at 10/17/14 1323 Last data filed at 10/17/14 0811  Gross per 24 hour  Intake 2408.75 ml  Output    300  ml  Net 2108.75 ml     Physical Exam  Awake Alert, Oriented , No new F.N deficits, Normal affect [name] [name].AT,PERRAL Supple Neck,No JVD, No cervical lymphadenopathy appriciated.  Symmetrical Chest wall movement, Good air movement bilaterally, CTAB Irregular,No Gallops,Rubs , No Parasternal Heave +ve B.Sounds, Abd Soft, No tenderness, No organomegaly appriciated, No rebound - guarding or rigidity. No Cyanosis, Clubbing or edema, No new Rash or bruise     Data Review   Micro Results Recent Results (from the past 240 hour(s))  Urine culture     Status: None   Collection Time: 10/15/14  5:03 AM  Result Value Ref Range Status   Specimen Description URINE, CATHETERIZED  Final   Special Requests NONE  Final   Colony Count NO GROWTH Performed at [facility]   Final   Culture NO GROWTH Performed at [facility]   Final   Report Status 10/16/2014 FINAL  Final    Radiology Reports No results found.  CBC  Recent Labs Lab 10/15/14 0300 10/17/14 0500  WBC 6.4  5.7  HGB 12.4* 11.1*  HCT 37.8* 33.2*  PLT 193 175  MCV 94.5 94.3  MCH 31.0 31.5  MCHC 32.8 33.4  RDW 14.5 14.5    Chemistries   Recent Labs Lab 10/15/14 0300 10/16/14 0500 10/17/14 0500  NA 140 140 139  K 3.5 3.5 3.8  CL 104 105 107  CO2 31 29 25   GLUCOSE 115* 104* 110*  BUN 12 12 13   CREATININE 1.99* 1.60* 1.64*  CALCIUM 8.8 8.5 8.6   ------------------------------------------------------------------------------------------------------------------ estimated creatinine clearance is 46.7 mL/min (by C-G formula based on Cr of 1.64). ------------------------------------------------------------------------------------------------------------------ No results for input(s): HGBA1C in the last 72 hours. ------------------------------------------------------------------------------------------------------------------ No results for input(s): CHOL, HDL, LDLCALC, TRIG, CHOLHDL, LDLDIRECT in the last 72 hours. ------------------------------------------------------------------------------------------------------------------  Recent Labs  10/15/14 1117  TSH 3.763   ------------------------------------------------------------------------------------------------------------------ No results for input(s): VITAMINB12, FOLATE, FERRITIN, TIBC, IRON, RETICCTPCT in the last 72 hours.  Coagulation profile No results for input(s): INR, PROTIME in the last 168 hours.  No results for input(s): DDIMER in the last 72 hours.  Cardiac Enzymes  Recent Labs Lab 10/15/14 1117 10/15/14 1430 10/15/14 2158  TROPONINI 0.04* 0.05* 0.04*   ------------------------------------------------------------------------------------------------------------------ Invalid input(s): POCBNP     Time Spent in minutes  35 minutes   Nic Lampe M.D on 10/17/2014 at 1:23 PM  Between 7am to 7pm - Pager - [phone]  After 7pm go to [email or web address] - password [email or web address]  And look for the night coverage  person covering for me after hours  [facility] Office  [phone]   **Disclaimer: This note may have been dictated with voice recognition software. Similar sounding words can inadvertently be transcribed and this note may contain transcription errors which may not have been corrected upon publication of note.**

## 2014-10-17 NOTE — Clinical Social Work Psych Note (Signed)
Patient has been cleared from psychiatry (please seen Dr. Shela CommonsJ. Jonnalagadda's documentation from 10/16/2014 1:55pm).  Unit CSW following for possible SNF placement.  [name] [name], LCSWA [phone]  Psychiatric & Orthopedics (5N 1-16) Clinical Social Worker

## 2014-10-17 NOTE — Progress Notes (Signed)
PT Cancellation Note  Patient Details Name: [name] [name] MRN: [id number] DOB: [birth year]   Cancelled Treatment:    Reason Eval/Treat Not Completed: Patient at procedure or test/unavailable Patient being transported for test/procedure. Will follow up as time allows. Likely tomorrow for continued therapy.  [name], Gervis Gaba S 10/17/2014, [name], [name]

## 2014-10-17 NOTE — Clinical Social Work Note (Signed)
Patient and wife are agreeable [facility]- facility is able to accept patient when medically stable- anticipated DC tomorrow 2/19  CSW will continue to follow.  Merlyn LotJenna Holoman, LCSWA Clinical Social Worker [phone]

## 2014-10-17 NOTE — Procedures (Signed)
EEG report.  Brief clinical history:  81 YO male with multiple falls and transient periods of loss of consciousness. Patient states events occur with no warning and are very short. Patient did show orthostatic hypotension on admission. Etiology at this time is unclear.  Technique: this is a 17 channel routine scalp EEG performed at the bedside with bipolar and monopolar montages arranged in accordance to the international 10/20 system of electrode placement. One channel was dedicated to EKG recording.  The study was performed during wakefulness, drowsiness, and stage 2 sleep. No activating procedures performed.  Description:In the wakeful state, the best background consisted of a medium amplitude, posterior dominant, well sustained, symmetric and reactive 8.5 Hz rhythm. Drowsiness demonstrated dropout of the alpha rhythm. Stage 2 sleep showed symmetric and synchronous sleep spindles without intermixed epileptiform discharges.  No focal or generalized epileptiform discharges noted.  No pathologic areas of slowing seen.  EKG showed sinus rhythm.  Impression: this is a normal awake and asleep EEG. Please, be aware that a normal EEG does not exclude the possibility of epilepsy.  Clinical correlation is advised.   [name] Portelasvaldo Camilo, MD [facility]

## 2014-10-17 NOTE — Consult Note (Signed)
NEURO HOSPITALIST CONSULT NOTE    Reason for Consult: Syncope   HPI:                                                                                                                                          [name] [name] is an 81 y.o. male with a history of afib, OSA, emphysema, hypothyroidism, craniotomy/aneurysm clip and severe aortic regurgitation. with recent cardiac work up for aortic repair at [facility]. Presents with Syncope and collapse. He was discharged from [facility] recently and admitted to rehab to gain strength prior to surgery.Patient recent admission to [facility] was secondary to presyncope and syncope, [name] [name] reports falling down over 20x in the past year. He states he will have a momentary period of visual disturbance and then will unexpectedly fall or run into something.  Patient  fell on the day of admission.  On admission to ED he was noted to have orthostatic BP 160/54 llying and 128/83 sitting. Recent echo done at [facility] showing EF 50%, severe AR. Carotid doppler while in hospital showed right ICA 40-59% stenosis and non visualized left ICA.   In further discussion about his falls, he states he gets no aura or sensation that he is going to fall, but will suddenly feel like he passed out and find himself on the floor.  This has occurred also while in bed where he will suddenly have a lost peroid of time.  HE states he feels it only occurs for a few seconds and is not confused after.  HE has never had incontinence during these episodes. The most recent event was while he was in the hospital.  HE was lying in the bed and felt as though the wall was moving "up in small increments" this lasted for a few seconds and resolved. He states if he "turns his head it will stop".  He states in the past year these episodes have only occurred about 10 times and less then once a week.   Past Medical History  Diagnosis Date  . Atrial fibrillation   . Hypothyroidism    . Plantar fasciitis   . Seasonal allergies   . Decreased testosterone level   . Varicose veins   . Hypertension   . Hypercholesteremia   . Aortic valve insufficiency   . Depression   . Heart murmur   . DVT (deep venous thrombosis) ~ 2013    "BLE"  . OSA (obstructive sleep apnea)     "suppose to wear mask; I throw it off in my sleep" (10/15/2014)  . COPD (chronic obstructive pulmonary disease)   . Emphysema of lung   . History of blood transfusion 1960    "lots; related to an accident"  . Arthritis     "wee bit;  knees, elbows" (10/15/2014)  . Anxiety   . Chronic kidney disease     "down to ~ 1/2 of their regular use; [place] (10/15/2014)  . Falls frequently     > 20 times in the last year/notes 10/15/2014  . Syncope and collapse "several times"    Past Surgical History  Procedure Laterality Date  . Abdominal aortic aneurysm repair  01/2006; 03/10/2006    [name] [name]/notes 01/12/2011  . Tonsillectomy    . Hernia repair    . Laparoscopic incisional / umbilical / ventral hernia repair  02/2010    VHR w/mesh/notes 03/28/2010  . Laparoscopic cholecystectomy  08/2009    w/IOC/notes 09/18/2009  . Ercp  11/2009    [name] [name]/notes 12/05/2009  . Cardiac catheterization  1990's X 1; 09/2014  . Brain surgery  1960 X 4    "S/P got my skull busted"    Family History  Problem Relation Age of Onset  . Heart disease Father   . Rheum arthritis Mother   . Rheum arthritis Father   . Cancer Sister      Social History:  reports that he quit smoking about 40 years ago. His smoking use included Cigarettes. He has a 44 pack-year smoking history. He has never used smokeless tobacco. He reports that he does not drink alcohol or use illicit drugs.  No Known Allergies  MEDICATIONS:                                                                                                                     Prior to Admission:  Prescriptions prior to admission  Medication Sig Dispense Refill Last Dose  .  albuterol (PROVENTIL HFA;VENTOLIN HFA) 108 (90 BASE) MCG/ACT inhaler Inhale 2 puffs into the lungs every 6 (six) hours as needed for wheezing or shortness of breath.   10/14/2014 at Unknown time  . apixaban (ELIQUIS) 2.5 MG TABS tablet Take 1 tablet (2.5 mg total) by mouth 2 (two) times daily. 60 tablet 3 10/14/2014 at Unknown time  . aspirin 81 MG tablet Take 81 mg by mouth daily.   10/14/2014 at Unknown time  . atorvastatin (LIPITOR) 10 MG tablet Take 10 mg by mouth daily at 6 PM.   10/14/2014 at Unknown time  . b complex vitamins tablet Take 1 tablet by mouth daily.   10/14/2014 at Unknown time  . buPROPion (WELLBUTRIN XL) 300 MG 24 hr tablet Take 300 mg by mouth daily.   10/14/2014 at Unknown time  . busPIRone (BUSPAR) 15 MG tablet Take 15 mg by mouth 2 (two) times daily.   10/14/2014 at Unknown time  . calcium carbonate (TUMS - DOSED IN MG ELEMENTAL CALCIUM) 500 MG chewable tablet Chew 1 tablet by mouth as needed for indigestion or heartburn.   10/14/2014 at Unknown time  . cholecalciferol (VITAMIN D) 1000 UNITS tablet Take 2,000 Units by mouth daily.   10/14/2014 at Unknown time  . clonazePAM (KLONOPIN) 0.5 MG tablet Take 0.5 mg by mouth at  bedtime as needed for anxiety.   10/14/2014 at Unknown time  . digoxin (LANOXIN) 0.125 MG tablet Take 0.125 mg by mouth daily.   10/14/2014 at Unknown time  . diltiazem (TIAZAC) 120 MG 24 hr capsule Take 120 mg by mouth daily.   10/14/2014 at Unknown time  . dutasteride (AVODART) 0.5 MG capsule Take 0.5 mg by mouth daily.   10/14/2014 at Unknown time  . escitalopram (LEXAPRO) 20 MG tablet Take 20 mg by mouth daily.   10/14/2014 at Unknown time  . Eszopiclone (ESZOPICLONE) 3 MG TABS Take 3 mg by mouth at bedtime. Take immediately before bedtime   10/14/2014 at Unknown time  . fluticasone (FLONASE) 50 MCG/ACT nasal spray Place 1 spray into both nostrils daily. 9.9 g 2 10/14/2014 at Unknown time  . folic acid (FOLVITE) 1 MG tablet Take 1 mg by mouth daily.   10/14/2014 at  Unknown time  . furosemide (LASIX) 40 MG tablet Take 40 mg by mouth.   10/14/2014 at Unknown time  . lamoTRIgine (LAMICTAL) 100 MG tablet Take 100 mg by mouth 2 (two) times daily. Breakfast and dinner   10/14/2014 at Unknown time  . levothyroxine (SYNTHROID, LEVOTHROID) 150 MCG tablet Take 150 mcg by mouth daily before breakfast.   10/14/2014 at Unknown time  . lisinopril (PRINIVIL,ZESTRIL) 40 MG tablet Take 40 mg by mouth daily.   10/14/2014 at Unknown time  . Melatonin 3 MG TABS Take 1 tablet by mouth at bedtime.   10/14/2014 at Unknown time  . naproxen sodium (ANAPROX) 220 MG tablet Take 220 mg by mouth 2 (two) times daily as needed (for pain).   10/14/2014 at Unknown time  . omega-3 acid ethyl esters (LOVAZA) 1 G capsule Take 2 g by mouth 2 (two) times daily.   10/14/2014 at Unknown time   Scheduled: . apixaban  2.5 mg Oral BID  . aspirin EC  81 mg Oral Daily  . atorvastatin  10 mg Oral q1800  . buPROPion  300 mg Oral Daily  . busPIRone  15 mg Oral BID  . cholecalciferol  2,000 Units Oral Daily  . digoxin  0.125 mg Oral Daily  . diltiazem  120 mg Oral Daily  . docusate sodium  100 mg Oral BID  . dutasteride  0.5 mg Oral Daily  . escitalopram  20 mg Oral Daily  . fluticasone  1 spray Each Nare Daily  . lamoTRIgine  100 mg Oral BID  . levothyroxine  150 mcg Oral QAC breakfast  . omega-3 acid ethyl esters  2 g Oral BID  . sodium chloride  3 mL Intravenous Q12H     ROS:                                                                                                                                       History obtained from the patient  General ROS: negative for - chills,  fatigue, fever, night sweats, weight gain or weight loss Psychological ROS: negative for - behavioral disorder, hallucinations, memory difficulties, mood swings or suicidal ideation Ophthalmic ROS: negative for - blurry vision, double vision, eye pain or loss of vision ENT ROS: negative for - epistaxis, nasal discharge,  oral lesions, sore throat, tinnitus or vertigo Allergy and Immunology ROS: negative for - hives or itchy/watery eyes Hematological and Lymphatic ROS: negative for - bleeding problems, bruising or swollen lymph nodes Endocrine ROS: negative for - galactorrhea, hair pattern changes, polydipsia/polyuria or temperature intolerance Respiratory ROS: negative for - cough, hemoptysis, shortness of breath or wheezing Cardiovascular ROS: negative for - chest pain, dyspnea on exertion, edema or irregular heartbeat Gastrointestinal ROS: negative for - abdominal pain, diarrhea, hematemesis, nausea/vomiting or stool incontinence Genito-Urinary ROS: negative for - dysuria, hematuria, incontinence or urinary frequency/urgency Musculoskeletal ROS: negative for - joint swelling or muscular weakness Neurological ROS: as noted in HPI Dermatological ROS: negative for rash and skin lesion changes   Blood pressure 169/59, pulse 75, temperature 98.1 F (36.7 C), temperature source Oral, resp. rate 18, weight 117.2 kg (258 lb 6.1 oz), SpO2 95 %.   Examination:                                                                                                      HEENT-  Normocephalic, no lesions, without obvious abnormality.  Normal external eye and conjunctiva.  Normal TM's bilaterally.  Normal auditory canals and external ears. Normal external nose, mucus membranes and septum.  Normal pharynx. Cardiovascular- irregularly irregular rhythm, pulses palpable throughout   Lungs- chest clear, no wheezing, rales, normal symmetric air entry Abdomen- normal findings: bowel sounds normal Extremities- no edema Lymph-no adenopathy palpable Musculoskeletal-no joint tenderness, deformity or swelling Skin-warm and dry, no hyperpigmentation, vitiligo, or suspicious lesions  Neurological Examination Mental Status: Alert, oriented, thought content appropriate.  Speech fluent without evidence of aphasia.  Able to follow 3 step  commands without difficulty. Cranial Nerves: II: Discs flat bilaterally; Visual fields grossly normal, pupils equal, round, reactive to light and accommodation III,IV, VI: ptosis not present, extra-ocular motions intact bilaterally V,VII: smile symmetric, facial light touch sensation normal bilaterally VIII: hearing normal bilaterally IX,X: gag reflex present XI: bilateral shoulder shrug XII: midline tongue extension Motor: Right : Upper extremity   5/5    Left:     Upper extremity   5/5  Lower extremity   5/5     Lower extremity   5/5 --intention tremor with finger to nose.  Tone and bulk:normal tone throughout; no atrophy noted Sensory: Pinprick and light touch intact throughout, bilaterally Deep Tendon Reflexes: 1+ and symmetric throughout Plantars: Right: downgoing   Left: downgoing Cerebellar: normal finger-to-nose, and normal heel-to-shin test Gait: not tested due to safety.     Lab Results: Basic Metabolic Panel:  Recent Labs Lab 10/15/14 0300 10/16/14 0500 10/17/14 0500  NA 140 140 139  K 3.5 3.5 3.8  CL 104 105 107  CO2 GLUCOSE 115* 104* 110*  BUN CREATININE 1.99* 1.60* 1.64*  CALCIUM 8.8 8.5 8.6    Liver Function Tests: No results for input(s): AST, ALT, ALKPHOS, BILITOT, PROT, ALBUMIN in the last 168 hours.  Recent Labs Lab 10/15/14 0300  LIPASE 24   No results for input(s): AMMONIA in the last 168 hours.  CBC:  Recent Labs Lab 10/15/14 0300 10/17/14 0500  WBC 6.4 5.7  HGB 12.4* 11.1*  HCT 37.8* 33.2*  MCV 94.5 94.3  PLT 193 175    Cardiac Enzymes:  Recent Labs Lab 10/15/14 1117 10/15/14 1430 10/15/14 2158  CKTOTAL  --  1548*  --   TROPONINI 0.04* 0.05* 0.04*    Lipid Panel: No results for input(s): CHOL, TRIG, HDL, CHOLHDL, VLDL, LDLCALC in the last 168 hours.  CBG: No results for input(s): GLUCAP in the last 168 hours.  Microbiology: Results for orders placed or performed during the hospital encounter  of 10/15/14  Urine culture     Status: None   Collection Time: 10/15/14  5:03 AM  Result Value Ref Range Status   Specimen Description URINE, CATHETERIZED  Final   Special Requests NONE  Final   Colony Count NO GROWTH Performed at [facility]   Final   Culture NO GROWTH Performed at [facility]   Final   Report Status 10/16/2014 FINAL  Final    Coagulation Studies: No results for input(s): LABPROT, INR in the last 72 hours.  Imaging: No results found.  Felicie Morn PA-C [facility] [phone]  10/17/2014, 11:06 AM   Assessment/Plan: 81 YO male with multiple falls and transient periods of loss of consciousness. Patient states events occur with no warning and are very short. Patient did show orthostatic hypotension on admission. Etiology at this time is unclear. Description of events do not resemble vertigo.  Given his prior intracranial surgery will evaluate for possible seizure.   Recommend: 1) EEG--If EEG negative no further neurodiagnostic testing recommended while in hospital.   2) consider long-term cardiac monitor due to unpredictable nature and infrequency of events.

## 2014-10-17 NOTE — Consult Note (Signed)
ORTHOPAEDIC CONSULTATION  REQUESTING PHYSICIAN: Huey Bienenstockawood Elgergawy, MD  Chief Complaint: Right leg pain  HPI: [name] [name] is a 81 y.o. male who complains of  right leg pain after a fall a couple of days ago. It's located directly over the proximal fibula, worse with weightbearing, better with rest. He has been in a cam boot, but says that this doesn't really help. He denies any other injuries in the fall. I attempted to see him a couple of days ago, but he was away for a procedure, and have finally had a chance to meet him in person. He has had multiple recent falls, and is reported transient loss of consciousness as well.  Past Medical History  Diagnosis Date  . Atrial fibrillation   . Hypothyroidism   . Plantar fasciitis   . Seasonal allergies   . Decreased testosterone level   . Varicose veins   . Hypertension   . Hypercholesteremia   . Aortic valve insufficiency   . Depression   . Heart murmur   . DVT (deep venous thrombosis) ~ 2013    "BLE"  . OSA (obstructive sleep apnea)     "suppose to wear mask; I throw it off in my sleep" (10/15/2014)  . COPD (chronic obstructive pulmonary disease)   . Emphysema of lung   . History of blood transfusion 1960    "lots; related to an accident"  . Arthritis     "wee bit; knees, elbows" (10/15/2014)  . Anxiety   . Chronic kidney disease     "down to ~ 1/2 of their regular use; [place]" (10/15/2014)  . Falls frequently     > 20 times in the last year/notes 10/15/2014  . Syncope and collapse "several times"   Past Surgical History  Procedure Laterality Date  . Abdominal aortic aneurysm repair  01/2006; 03/10/2006    Hattie Perch/notes 01/12/2011  . Tonsillectomy    . Hernia repair    . Laparoscopic incisional / umbilical / ventral hernia repair  02/2010    VHR w/mesh/notes 03/28/2010  . Laparoscopic cholecystectomy  08/2009    w/IOC/notes 09/18/2009  . Ercp  11/2009    Hattie Perch/notes 12/05/2009  . Cardiac catheterization  1990's X  1; 09/2014  . Brain surgery  1960 X 4    "S/P got my skull busted"   History   Social History  . Marital Status: Married    Spouse Name: N/A  . Number of Children: N/A  . Years of Education: N/A   Social History Main Topics  . Smoking status: Former Smoker -- 2.00 packs/day for 22 years    Types: Cigarettes    Quit date: 04/29/1974  . Smokeless tobacco: Never Used  . Alcohol Use: No  . Drug Use: No  . Sexual Activity: Not Currently   Other Topics Concern  . None   Social History Narrative   Family History  Problem Relation Age of Onset  . Heart disease Father   . Rheum arthritis Mother   . Rheum arthritis Father   . Cancer Sister    No Known Allergies Prior to Admission medications   Medication Sig Start Date End Date Taking? Authorizing Provider  albuterol (PROVENTIL HFA;VENTOLIN HFA) 108 (90 BASE) MCG/ACT inhaler Inhale 2 puffs into the lungs every 6 (six) hours as needed for wheezing or shortness of breath.   Yes Historical Provider, MD  apixaban (ELIQUIS) 2.5 MG TABS tablet Take 1 tablet (2.5 mg total) by mouth 2 (two)  times daily. 06/22/14  Yes Osvaldo Shipper, MD  aspirin 81 MG tablet Take 81 mg by mouth daily.   Yes Historical Provider, MD  atorvastatin (LIPITOR) 10 MG tablet Take 10 mg by mouth daily at 6 PM.   Yes Historical Provider, MD  b complex vitamins tablet Take 1 tablet by mouth daily.   Yes Historical Provider, MD  buPROPion (WELLBUTRIN XL) 300 MG 24 hr tablet Take 300 mg by mouth daily.   Yes Historical Provider, MD  busPIRone (BUSPAR) 15 MG tablet Take 15 mg by mouth 2 (two) times daily.   Yes Historical Provider, MD  calcium carbonate (TUMS - DOSED IN MG ELEMENTAL CALCIUM) 500 MG chewable tablet Chew 1 tablet by mouth as needed for indigestion or heartburn.   Yes Historical Provider, MD  cholecalciferol (VITAMIN D) 1000 UNITS tablet Take 2,000 Units by mouth daily.   Yes Historical Provider, MD  clonazePAM (KLONOPIN) 0.5 MG tablet Take 0.5 mg by mouth  at bedtime as needed for anxiety.   Yes Historical Provider, MD  digoxin (LANOXIN) 0.125 MG tablet Take 0.125 mg by mouth daily.   Yes Historical Provider, MD  diltiazem (TIAZAC) 120 MG 24 hr capsule Take 120 mg by mouth daily.   Yes Historical Provider, MD  dutasteride (AVODART) 0.5 MG capsule Take 0.5 mg by mouth daily.   Yes Historical Provider, MD  escitalopram (LEXAPRO) 20 MG tablet Take 20 mg by mouth daily.   Yes Historical Provider, MD  Eszopiclone (ESZOPICLONE) 3 MG TABS Take 3 mg by mouth at bedtime. Take immediately before bedtime   Yes Historical Provider, MD  fluticasone (FLONASE) 50 MCG/ACT nasal spray Place 1 spray into both nostrils daily. 06/22/14  Yes Osvaldo Shipper, MD  folic acid (FOLVITE) 1 MG tablet Take 1 mg by mouth daily.   Yes Historical Provider, MD  furosemide (LASIX) 40 MG tablet Take 40 mg by mouth.   Yes Historical Provider, MD  lamoTRIgine (LAMICTAL) 100 MG tablet Take 100 mg by mouth 2 (two) times daily. Breakfast and dinner   Yes Historical Provider, MD  levothyroxine (SYNTHROID, LEVOTHROID) 150 MCG tablet Take 150 mcg by mouth daily before breakfast.   Yes Historical Provider, MD  lisinopril (PRINIVIL,ZESTRIL) 40 MG tablet Take 40 mg by mouth daily.   Yes Historical Provider, MD  Melatonin 3 MG TABS Take 1 tablet by mouth at bedtime.   Yes Historical Provider, MD  naproxen sodium (ANAPROX) 220 MG tablet Take 220 mg by mouth 2 (two) times daily as needed (for pain).   Yes Historical Provider, MD  omega-3 acid ethyl esters (LOVAZA) 1 G capsule Take 2 g by mouth 2 (two) times daily.   Yes Historical Provider, MD   No results found.  Positive ROS: All other systems have been reviewed and were otherwise negative with the exception of those mentioned in the HPI and as above.  Physical Exam: General: Alert, no acute distress Cardiovascular: He has mild soft tissue swelling in the right leg. This is fairly similar to the contralateral side however. Respiratory: No  cyanosis, no use of accessory musculature GI: No organomegaly, abdomen is soft and non-tender Skin: Both lower extremities have multiple areas of excoriation, but no full-thickness ulceration or skin breakdown. Neurologic: Sensation intact on the dorsum of the foot on the right side, although he says he does not have completely normal sensation on the plantar aspect of foot. Psychiatric: Patient is competent for consent with normal mood and affect Lymphatic: No axillary or cervical lymphadenopathy  MUSCULOSKELETAL: Right leg has positive pain to palpation over the proximal third of the fibula. The knee has no effusion and is nontender. LCL feels stable, no tenderness. Ankle is nontender with no effusion. No bruising in that location either.  Assessment: Acute right proximal fibula fracture isolated, multiple coexisting comorbidities as listed above, with syncope  Plan: This is an acute injury and will take probably 2-3 months to fully recover from. He is okay to weight-bear as tolerated, and the cam boot is really supportive, more than required. He can certainly have it off always in bed, and if it is helpful he can use it while ambulating, although he is also capable of ambulating without the boot. It is really optional. He himself doesn't really want. I've also offered him a cast which he has declined. His ankle and knee seems to be okay.  I'll plan to see him in the office in the next week or 2, for follow-up x-rays and repeat examination.    Eulas Post, MD Cell [phone]   10/17/2014 5:56 PM

## 2014-10-17 NOTE — Progress Notes (Signed)
EEG Completed; Results Pending  

## 2014-10-17 NOTE — Progress Notes (Signed)
Patient Profile: [name] [name] is a 81 y.o. male with a history of chronic afib on Eliquis, HTN, OSA, emphysema, hypothyroidism, craniotomy/aneurysm clip and severe aortic regurgitation with CAD who presented to [facility] ED 10/15/14 with syncope.   Subjective: Resting comfortably. He reports 2 recurrent spells since admission. He noted the walls in his room were moving and he felt a bit dizzy. Symptoms were relived by changing head position.   Objective: Vital signs in last 24 hours: Temp:  [97.8 F (36.6 C)-98.4 F (36.9 C)] 98.1 F (36.7 C) (02/18 0500) Pulse Rate:  [66-75] 75 (02/18 0500) Resp:  [18] 18 (02/18 0500) BP: (143-169)/(54-74) 169/59 mmHg (02/18 0500) SpO2:  [90 %-95 %] 95 % (02/18 0500) Weight:  [258 lb 6.1 oz (117.2 kg)] 258 lb 6.1 oz (117.2 kg) (02/18 0500) Last BM Date: 10/16/14  Intake/Output from previous day: 02/17 0701 - 02/18 0700 In: 2388.8 [P.O.:1050; I.V.:1338.8] Out: 300 [Urine:300] Intake/Output this shift: Total I/O In: 360 [P.O.:360] Out: -   Medications Current Facility-Administered Medications  Medication Dose Route Frequency Provider Last Rate Last Dose  . acetaminophen (TYLENOL) tablet 650 mg  650 mg Oral Q6H PRN Stephani Police, PA-C       Or  . acetaminophen (TYLENOL) suppository 650 mg  650 mg Rectal Q6H PRN Stephani Police, PA-C      . albuterol (PROVENTIL) (2.5 MG/3ML) 0.083% nebulizer solution 2.5 mg  2.5 mg Nebulization Q6H PRN Darl Householder Masters, [facility]      . alum & mag hydroxide-simeth (MAALOX/MYLANTA) 200-200-20 MG/5ML suspension 30 mL  30 mL Oral Q6H PRN Stephani Police, PA-C   30 mL at 10/16/14 2136  . apixaban (ELIQUIS) tablet 2.5 mg  2.5 mg Oral BID Stephani Police, PA-C   2.5 mg at 10/16/14 2100  . aspirin EC tablet 81 mg  81 mg Oral Daily Stephani Police, PA-C   81 mg at 10/16/14 1021  . atorvastatin (LIPITOR) tablet 10 mg  10 mg Oral q1800 Stephani Police, PA-C   10 mg at 10/16/14 1616  . buPROPion (WELLBUTRIN XL) 24 hr  tablet 300 mg  300 mg Oral Daily Stephani Police, PA-C   300 mg at 10/16/14 1021  . busPIRone (BUSPAR) tablet 15 mg  15 mg Oral BID Stephani Police, PA-C   15 mg at 10/16/14 2100  . calcium carbonate (TUMS - dosed in mg elemental calcium) chewable tablet 200 mg of elemental calcium  1 tablet Oral PRN Stephani Police, PA-C      . cholecalciferol (VITAMIN D) tablet 2,000 Units  2,000 Units Oral Daily Stephani Police, PA-C   2,000 Units at 10/16/14 1021  . clonazePAM (KLONOPIN) tablet 0.5 mg  0.5 mg Oral BID BM & HS PRN Stephani Police, PA-C   0.5 mg at 10/17/14 0111  . digoxin (LANOXIN) tablet 0.125 mg  0.125 mg Oral Daily Stephani Police, PA-C   0.125 mg at 10/16/14 1021  . diltiazem (TIAZAC) 24 hr capsule 120 mg  120 mg Oral Daily Stephani Police, PA-C   120 mg at 10/16/14 1022  . docusate sodium (COLACE) capsule 100 mg  100 mg Oral BID Stephani Police, PA-C   100 mg at 10/16/14 2100  . dutasteride (AVODART) capsule 0.5 mg  0.5 mg Oral Daily Stephani Police, PA-C   0.5 mg at 10/16/14 1021  . escitalopram (LEXAPRO) tablet 20 mg  20 mg Oral Daily Stephani Police, PA-C  20 mg at 10/16/14 1022  . fluticasone (FLONASE) 50 MCG/ACT nasal spray 1 spray  1 spray Each Nare Daily Stephani Police, PA-C   1 spray at 10/16/14 1026  . lamoTRIgine (LAMICTAL) tablet 100 mg  100 mg Oral BID Stephani Police, PA-C   100 mg at 10/16/14 2100  . levothyroxine (SYNTHROID, LEVOTHROID) tablet 150 mcg  150 mcg Oral QAC breakfast Stephani Police, PA-C   150 mcg at 10/17/14 2841  . morphine 2 MG/ML injection 1 mg  1 mg Intravenous Q3H PRN Stephani Police, PA-C      . omega-3 acid ethyl esters (LOVAZA) capsule 2 g  2 g Oral BID Stephani Police, PA-C   2 g at 10/16/14 2100  . ondansetron (ZOFRAN) tablet 4 mg  4 mg Oral Q6H PRN Stephani Police, PA-C       Or  . ondansetron [facility]) injection 4 mg  4 mg Intravenous Q6H PRN Stephani Police, PA-C      . oxyCODONE (Oxy IR/ROXICODONE) immediate release tablet 5 mg  5 mg Oral Q4H PRN  Stephani Police, PA-C   5 mg at 10/16/14 2136  . sodium chloride 0.9 % injection 3 mL  3 mL Intravenous Q12H Stephani Police, PA-C   3 mL at 10/16/14 2100    PE: General appearance: alert, cooperative and no distress Neck: no carotid bruit and no JVD Lungs: clear to auscultation bilaterally Heart: irregularly irregular rhythm and 2/6 diastolic murmur best heard at the apex Extremities: no LEE Pulses: 2+ and symmetric Skin: warm and dry Neurologic: Grossly normal  Lab Results:   Recent Labs  10/15/14 0300 10/17/14 0500  WBC 6.4 5.7  HGB 12.4* 11.1*  HCT 37.8* 33.2*  PLT 193 175   BMET  Recent Labs  10/15/14 0300 10/16/14 0500 10/17/14 0500  NA 140 140 139  K 3.5 3.5 3.8  CL 104 105 107  CO2 GLUCOSE 115* 104* 110*  BUN CREATININE 1.99* 1.60* 1.64*  CALCIUM 8.8 8.5 8.6     Assessment/Plan  Principal Problem:   Episodes of formed visual hallucinations Active Problems:   Atrial fibrillation, chronic   Hypothyroidism   Hypertension   Hypercholesteremia   Aortic valve insufficiency   CAD (coronary artery disease), native coronary artery   Falls frequently   Syncope   Acute on chronic renal failure   Right fibular fracture   Hypoxia   Syncope and collapse   Faintness   1. ? Syncope: other than Afib no other arrhthymias noted. Afib has been rate controlled. No pauses or bradycardia. Carotid dopplers revealed only mild-moderate right ICA stenosis of 40-59%. Cardiovascular etiologies seem unlikely. Story seems more consistent with vertigo. Continue neuro consult.   2. Severe Aortic Insufficiency: echo 1/16 at [facility] revelaed EF of 50% w/ severe AI. No significant AS. He was not thought to be a good TAVR candidate at [facility]. He has a diastolic murmur. At this point, he is not a very good AVR candidate with recent fibular fracture. This needs to heal and he will likely need physical therapy to get his strength up prior to operation. He is  thinking about trying to have any procedure done in [place]. We could reassess him for TAVR as well.   3.  Orthostatic Hypotension: IVFs given. Hold Lasix and lisinopril for now.   4. AKI: Improving. Scr 1.99--> 1.64. Baseline SCr is 1.5. Continue to hold Lasix and lisinopril  5. Chronic Atrial Fibrillation: rate is well controlled. Continue Cardizem for rate control and Eliquis for stroke prophylaxis.     LOS: 2 days    Brittainy M. Delmer IslamSimmons, PA-C 10/17/2014 8:36 AM

## 2014-10-17 NOTE — Progress Notes (Signed)
Pt refuses to wear CPAP tonight. He states that he does not wear it at home. Pt understands to call RN/RT if he changes his mind. RT will monitor.

## 2014-10-18 DIAGNOSIS — I350 Nonrheumatic aortic (valve) stenosis: Secondary | ICD-10-CM | POA: Diagnosis not present

## 2014-10-18 DIAGNOSIS — I10 Essential (primary) hypertension: Secondary | ICD-10-CM | POA: Diagnosis not present

## 2014-10-18 DIAGNOSIS — M79675 Pain in left toe(s): Secondary | ICD-10-CM | POA: Diagnosis not present

## 2014-10-18 DIAGNOSIS — R9439 Abnormal result of other cardiovascular function study: Secondary | ICD-10-CM | POA: Diagnosis not present

## 2014-10-18 DIAGNOSIS — L089 Local infection of the skin and subcutaneous tissue, unspecified: Secondary | ICD-10-CM | POA: Diagnosis not present

## 2014-10-18 DIAGNOSIS — M6282 Rhabdomyolysis: Secondary | ICD-10-CM | POA: Diagnosis not present

## 2014-10-18 DIAGNOSIS — J449 Chronic obstructive pulmonary disease, unspecified: Secondary | ICD-10-CM | POA: Diagnosis not present

## 2014-10-18 DIAGNOSIS — R411 Anterograde amnesia: Secondary | ICD-10-CM | POA: Diagnosis not present

## 2014-10-18 DIAGNOSIS — I25119 Atherosclerotic heart disease of native coronary artery with unspecified angina pectoris: Secondary | ICD-10-CM | POA: Diagnosis not present

## 2014-10-18 DIAGNOSIS — I351 Nonrheumatic aortic (valve) insufficiency: Secondary | ICD-10-CM | POA: Diagnosis not present

## 2014-10-18 DIAGNOSIS — G4733 Obstructive sleep apnea (adult) (pediatric): Secondary | ICD-10-CM | POA: Diagnosis not present

## 2014-10-18 DIAGNOSIS — F39 Unspecified mood [affective] disorder: Secondary | ICD-10-CM | POA: Diagnosis not present

## 2014-10-18 DIAGNOSIS — Z9181 History of falling: Secondary | ICD-10-CM | POA: Diagnosis not present

## 2014-10-18 DIAGNOSIS — N189 Chronic kidney disease, unspecified: Secondary | ICD-10-CM | POA: Diagnosis not present

## 2014-10-18 DIAGNOSIS — R262 Difficulty in walking, not elsewhere classified: Secondary | ICD-10-CM | POA: Diagnosis not present

## 2014-10-18 DIAGNOSIS — I482 Chronic atrial fibrillation: Secondary | ICD-10-CM | POA: Diagnosis not present

## 2014-10-18 DIAGNOSIS — R296 Repeated falls: Secondary | ICD-10-CM | POA: Diagnosis not present

## 2014-10-18 DIAGNOSIS — R269 Unspecified abnormalities of gait and mobility: Secondary | ICD-10-CM | POA: Diagnosis not present

## 2014-10-18 DIAGNOSIS — I129 Hypertensive chronic kidney disease with stage 1 through stage 4 chronic kidney disease, or unspecified chronic kidney disease: Secondary | ICD-10-CM | POA: Diagnosis not present

## 2014-10-18 DIAGNOSIS — R05 Cough: Secondary | ICD-10-CM | POA: Diagnosis not present

## 2014-10-18 DIAGNOSIS — Z7901 Long term (current) use of anticoagulants: Secondary | ICD-10-CM | POA: Diagnosis not present

## 2014-10-18 DIAGNOSIS — S82401D Unspecified fracture of shaft of right fibula, subsequent encounter for closed fracture with routine healing: Secondary | ICD-10-CM | POA: Diagnosis not present

## 2014-10-18 DIAGNOSIS — M79674 Pain in right toe(s): Secondary | ICD-10-CM | POA: Diagnosis not present

## 2014-10-18 DIAGNOSIS — N179 Acute kidney failure, unspecified: Secondary | ICD-10-CM | POA: Diagnosis not present

## 2014-10-18 DIAGNOSIS — E785 Hyperlipidemia, unspecified: Secondary | ICD-10-CM | POA: Diagnosis not present

## 2014-10-18 DIAGNOSIS — E039 Hypothyroidism, unspecified: Secondary | ICD-10-CM | POA: Diagnosis not present

## 2014-10-18 DIAGNOSIS — R278 Other lack of coordination: Secondary | ICD-10-CM | POA: Diagnosis not present

## 2014-10-18 DIAGNOSIS — H5316 Psychophysical visual disturbances: Secondary | ICD-10-CM | POA: Diagnosis not present

## 2014-10-18 DIAGNOSIS — I2510 Atherosclerotic heart disease of native coronary artery without angina pectoris: Secondary | ICD-10-CM | POA: Diagnosis not present

## 2014-10-18 DIAGNOSIS — J302 Other seasonal allergic rhinitis: Secondary | ICD-10-CM | POA: Diagnosis not present

## 2014-10-18 DIAGNOSIS — I4891 Unspecified atrial fibrillation: Secondary | ICD-10-CM | POA: Diagnosis not present

## 2014-10-18 DIAGNOSIS — R55 Syncope and collapse: Secondary | ICD-10-CM | POA: Diagnosis not present

## 2014-10-18 DIAGNOSIS — B351 Tinea unguium: Secondary | ICD-10-CM | POA: Diagnosis not present

## 2014-10-18 DIAGNOSIS — E038 Other specified hypothyroidism: Secondary | ICD-10-CM | POA: Diagnosis not present

## 2014-10-18 DIAGNOSIS — J439 Emphysema, unspecified: Secondary | ICD-10-CM | POA: Diagnosis not present

## 2014-10-18 DIAGNOSIS — M6281 Muscle weakness (generalized): Secondary | ICD-10-CM | POA: Diagnosis not present

## 2014-10-18 DIAGNOSIS — E780 Pure hypercholesterolemia: Secondary | ICD-10-CM | POA: Diagnosis not present

## 2014-10-18 DIAGNOSIS — N183 Chronic kidney disease, stage 3 (moderate): Secondary | ICD-10-CM | POA: Diagnosis not present

## 2014-10-18 DIAGNOSIS — F329 Major depressive disorder, single episode, unspecified: Secondary | ICD-10-CM | POA: Diagnosis not present

## 2014-10-18 DIAGNOSIS — E034 Atrophy of thyroid (acquired): Secondary | ICD-10-CM | POA: Diagnosis not present

## 2014-10-18 DIAGNOSIS — F419 Anxiety disorder, unspecified: Secondary | ICD-10-CM | POA: Diagnosis not present

## 2014-10-18 DIAGNOSIS — Z7982 Long term (current) use of aspirin: Secondary | ICD-10-CM | POA: Diagnosis not present

## 2014-10-18 DIAGNOSIS — R1312 Dysphagia, oropharyngeal phase: Secondary | ICD-10-CM | POA: Diagnosis not present

## 2014-10-18 LAB — BASIC METABOLIC PANEL
Anion gap: 5 (ref 5–15)
BUN: 10 mg/dL (ref 6–23)
CO2: 30 mmol/L (ref 19–32)
Calcium: 8.5 mg/dL (ref 8.4–10.5)
Chloride: 106 mmol/L (ref 96–112)
Creatinine, Ser: 1.61 mg/dL — ABNORMAL HIGH (ref 0.50–1.35)
GFR calc Af Amer: 45 mL/min — ABNORMAL LOW (ref >90)
GFR, EST NON AFRICAN AMERICAN: 38 mL/min — AB (ref >90)
GLUCOSE: 99 mg/dL (ref 70–99)
Potassium: 3.4 mmol/L — ABNORMAL LOW (ref 3.5–5.1)
Sodium: 141 mmol/L (ref 135–145)

## 2014-10-18 MED ORDER — ACETAMINOPHEN 325 MG PO TABS: 650.0000 mg | ORAL_TABLET | Freq: Four times a day (QID) | ORAL | Status: DC | PRN

## 2014-10-18 MED ORDER — CLONAZEPAM 0.5 MG PO TABS: 0.5000 mg | ORAL_TABLET | Freq: Every evening | ORAL | Status: DC | PRN

## 2014-10-18 MED ORDER — DOCUSATE SODIUM 100 MG PO CAPS: 100.0000 mg | ORAL_CAPSULE | Freq: Every day | ORAL | Status: DC | PRN

## 2014-10-18 MED ORDER — OXYCODONE HCL 5 MG PO TABS: 5.0000 mg | ORAL_TABLET | Freq: Four times a day (QID) | ORAL | Status: DC | PRN

## 2014-10-18 NOTE — Clinical Social Work Note (Signed)
[facility] Anticipated discharge date:10/18/14 Family notified: - at bedside Transportation by family  CSW signing off.  Merlyn LotJenna Holoman, LCSWA Clinical Social Worker [phone]

## 2014-10-18 NOTE — Discharge Summary (Signed)
[name] [name], is a 81 y.o. male  DOB 1931/09/21  MRN [id number].  Admission date:  10/15/2014  Admitting Physician  No admitting provider for patient encounter.  Discharge Date:  10/18/2014   Primary MD  Cain Saupe, MD  Recommendations for primary care physician for things to follow:  - Please follow on CBC, BMP within 3 days from discharge, patient is recovering from acute renal failure. - Patient will need to follow with cardiology on his previously scheduled appointment on 2/23 with Dr. Eden Emms. - Agent will need to follow with orthopedic Dr. Dion Saucier in 1 week from discharge, regarding acute right proximal fibula fracture. -Patient okay to weight-bear as tolerated at right lower extremity, can use cam boot on ambulation, can be taken off when patient at rest. - will need repeat carotid Doppler in 6 months.  Admission Diagnosis  Frequent falls [R29.6] Closed fracture of proximal fibula, right, initial encounter [S82.831A] Syncope, unspecified syncope type [R55]   Discharge Diagnosis  Frequent falls [R29.6] Closed fracture of proximal fibula, right, initial encounter [S82.831A] Syncope, unspecified syncope type [R55]    Principal Problem:   Episodes of formed visual hallucinations Active Problems:   Atrial fibrillation, chronic   Hypothyroidism   Hypertension   Hypercholesteremia   Aortic valve insufficiency   CAD (coronary artery disease), native coronary artery   Falls frequently   Syncope   Acute on chronic renal failure   Right fibular fracture   Hypoxia   Syncope and collapse   Faintness      Past Medical History  Diagnosis Date  . Atrial fibrillation   . Hypothyroidism   . Plantar fasciitis   . Seasonal allergies   . Decreased testosterone level   . Varicose veins   . Hypertension    . Hypercholesteremia   . Aortic valve insufficiency   . Depression   . Heart murmur   . DVT (deep venous thrombosis) ~ 2013    "BLE"  . OSA (obstructive sleep apnea)     "suppose to wear mask; I throw it off in my sleep" (10/15/2014)  . COPD (chronic obstructive pulmonary disease)   . Emphysema of lung   . History of blood transfusion 1960    "lots; related to an accident"  . Arthritis     "wee bit; knees, elbows" (10/15/2014)  . Anxiety   . Chronic kidney disease     "down to ~ 1/2 of their regular use; I see kidney dr. in [place]" (10/15/2014)  . Falls frequently     > 20 times in the last year/notes 10/15/2014  . Syncope and collapse "several times"    Past Surgical History  Procedure Laterality Date  . Abdominal aortic aneurysm repair  01/2006; 03/10/2006    Hattie Perch 01/12/2011  . Tonsillectomy    . Hernia repair    . Laparoscopic incisional / umbilical / ventral hernia repair  02/2010    VHR w/mesh/notes 03/28/2010  . Laparoscopic cholecystectomy  08/2009    w/IOC/notes 09/18/2009  . Ercp  11/2009    /  notes 12/05/2009  . Cardiac catheterization  1990's X 1; 09/2014  . Brain surgery  1960 X 4    "S/P got my skull busted"       History of present illness and  Hospital Course:     Kindly see H&P for history of present illness and admission details, please review complete Labs, Consult reports and Test reports for all details in brief [name] [name] is a 81 y.o. male, with a history of afib, OSA, emphysema, hypothyroidism, craniotomy/aneurysm clip and severe aortic regurgitation. with recent cardiac work up for aortic repair at [facility]. Presents with Syncope and collapse. He was discharged from [facility] recently and admitted to rehab to gain strength prior to surgery.Patient recent admission to [facility] was secondary to presyncope and syncope, [name] [name] reports falling down over 20x in the past year. He states he will have a momentary period of visual disturbance and then will  unexpectedly fall or run into something - such as a door frame. Over the past 6 days he reports an increasing cough and he has developed "lung pain". His son reports the patient has been confused and slightly agitated recently. On 2/15 he fell and was on the floor for several hours. This morning at approximately 2:00 am, he got up then fell and was out for only a short period.   In the ER [name] [name] was orthostatic on exam. SBP 160 lying and 125 when standing. He has acute renal failure with a creatinine of 1.99 compared to baseline of approximately 1.5. His BNP is minimally elevated as is his troponin (0.04). Digoxin level is normal (0.8). CT head and spine do not show acute changes. Xray revealed a fracture of the right proximal fibular diaphysis. Dr. Dion Saucier was consulted. Patient is known to have history of severe aortic regurgitation, she's been followed at [facility], with recent hospitalization they are 09/18/14, secondary to presyncope/syncope thought to be secondary to his aortic regurgitation   Syncope: - Multiple possible etiologies: Orthostasis, Dehydration, Polypharmacy and cardiac, versus vertigo . - This is most likely related to his severe aortic regurgitation, recent echo done at [facility] showing EF 50%, severe AR - Patient was orthostatic initially on admission  - Cardiology consult appreciated, they think cardiovascular etiologies are unlikely. - Neurology consult appreciated, EEG was obtained as their recommendation, normal EEG. - carotid Doppler showing Right: 40-59% ICA stenosis. Left: Common and internal carotid artery not insonated. Bilateral: Vertebral artery flow is antegrade. Will need repeat carotid Doppler in 6 months as discussed with Dr. Arbie Cookey from vascular surgery.  Acute on chronic renal failure - Baseline creatinine is 1.5, was 1.9 on admission, improving with hydration - Continue to hold Lasix and lisinopril on discharge - Secondary to rhabdo.  -  Improving, back to baseline 1.6   Rhabdomyolysis - Continue with IV fluid, hold nephrotoxic medication, creatinine improving .  Abnormal carotid Doppler: - Carotid Doppler showing Right: 40-59% ICA stenosis. Left: Common and internal carotid artery not insonated. Bilateral: Vertebral artery flow is antegrade. Will need repeat carotid Doppler in 6 months as discussed with Dr. Arbie Cookey from vascular surgery. - We'll hold on CT head and neck angiogram , given poor kidney function, and patient is recovering from rhabdomyolysis   Proximal Fibula Fracture. - Phlebitic consult appreciated, PT OT, weightbearing as tolerated on right lower extremity, may use cam boot on ambulation.  AFib. Rate controlled. On eliquis  Hypothyroidism.  Continue Synthroid. TSH WNL.  Hypertension - Blood pressure is elevated, but patient remains  orthostatic by numbers , so will continue to hold Lasix and lisinopril on discharge, continue with diltiazem on discharge.  Depression/Psychotropics On lamictal, klonopin, Eszopiclone, Lexapro, buspar, wellbutrin.  Psych consult appreciated  OSA Patient is noncompliant with C Pap, has been refusing it, did not use it for years.  Urine culture  Per EDP urine had a strong odor and a U/A and culture was ordered.  U/A appeared negative for infection - other than some RBCs.  Coronary artery disease On ASA  Cough and "lung pain" -From the notes it appears the patient has a chronic cough. - troponins 0.04>> 0.05>> 0.04   Visual hallucinations - Psychiatry consult appreciated, no change in medications  Code Status: Partial      Follow UP  Follow-up Information    Follow up with LANDAU,JOSHUA P, MD. Schedule an appointment as soon as possible for a visit in 1 week.   Specialty:  Orthopedic Surgery   Contact information:   [address] [place] [address] [phone]       Follow up with Charlton Haws, MD.   Specialty:   Cardiology   Contact information:   [address] [place] [address] [phone]         Discharge Instructions  and  Discharge Medications   - Please follow on CBC, BMP within 3 days from discharge, patient is recovering from acute renal failure. - Patient will need to follow with cardiology on his previously scheduled appointment on 2/23 with Dr. Eden Emms. - Agent will need to follow with orthopedic Dr. Dion Saucier in 1 week from discharge, regarding acute right proximal fibula fracture. -Patient okay to weight-bear as tolerated at right lower extremity, can use cam boot on ambulation, can be taken off when patient at rest. - will need repeat carotid Doppler in 6 months.       Discharge Instructions    Diet - low sodium heart healthy    Complete by:  As directed      Discharge instructions    Complete by:  As directed   Follow with Primary MD FULP, CAMMIE, MD or skilled nursing facility physician.  Get CBC, CMP, 2 view Chest X ray checked   within 3 days.    Activity: As tolerated with Full fall precautions use walker/cane & assistance as needed, with weight bearing as tolerated on right lower extremity, may use boot upon ambulation, and be taking off while patient in bed.   Disposition Home **   Diet: Heart Healthy  , with feeding assistance and aspiration precautions as needed.  For Heart failure patients - Check your Weight same time everyday, if you gain over 2 pounds, or you develop in leg swelling, experience more shortness of breath or chest pain, call your Primary MD immediately. Follow Cardiac Low Salt Diet and 1.5 lit/day fluid restriction.   On your next visit with your primary care physician please Get Medicines reviewed and adjusted.   Please request your Prim.MD to go over all Hospital Tests and Procedure/Radiological results at the follow up, please get all Hospital records sent to your Prim MD by signing hospital release before you go  home.   If you experience worsening of your admission symptoms, develop shortness of breath, life threatening emergency, suicidal or homicidal thoughts you must seek medical attention immediately by calling 911 or calling your MD immediately  if symptoms less severe.  You Must read complete instructions/literature along with all the possible adverse reactions/side effects  for all the Medicines you take and that have been prescribed to you. Take any new Medicines after you have completely understood and accpet all the possible adverse reactions/side effects.   Do not drive, operating heavy machinery, perform activities at heights, swimming or participation in water activities or provide baby sitting services if your were admitted for syncope or siezures until you have seen by Primary MD or a Neurologist and advised to do so again.  Do not drive when taking Pain medications.    Do not take more than prescribed Pain, Sleep and Anxiety Medications  Special Instructions: If you have smoked or chewed Tobacco  in the last 2 yrs please stop smoking, stop any regular Alcohol  and or any Recreational drug use.  Wear Seat belts while driving.   Please note  You were cared for by a hospitalist during your hospital stay. If you have any questions about your discharge medications or the care you received while you were in the hospital after you are discharged, you can call the unit and asked to speak with the hospitalist on call if the hospitalist that took care of you is not available. Once you are discharged, your primary care physician will handle any further medical issues. Please note that NO REFILLS for any discharge medications will be authorized once you are discharged, as it is imperative that you return to your primary care physician (or establish a relationship with a primary care physician if you do not have one) for your aftercare needs so that they can reassess your need for medications and  monitor your lab values.     Weight bearing as tolerated    Complete by:  As directed   Use of cam boot is optional, only as desired by the patient for both bed and ambulation.  Laterality:  bilateral  Extremity:  Lower            Medication List    STOP taking these medications        furosemide 40 MG tablet  Commonly known as:  LASIX     lisinopril 40 MG tablet  Commonly known as:  PRINIVIL,ZESTRIL     naproxen sodium 220 MG tablet  Commonly known as:  ANAPROX      TAKE these medications        acetaminophen 325 MG tablet  Commonly known as:  TYLENOL  Take 2 tablets (650 mg total) by mouth every 6 (six) hours as needed for mild pain (or Fever >/= 101).     albuterol 108 (90 BASE) MCG/ACT inhaler  Commonly known as:  PROVENTIL HFA;VENTOLIN HFA  Inhale 2 puffs into the lungs every 6 (six) hours as needed for wheezing or shortness of breath.     apixaban 2.5 MG Tabs tablet  Commonly known as:  ELIQUIS  Take 1 tablet (2.5 mg total) by mouth 2 (two) times daily.     aspirin 81 MG tablet  Take 81 mg by mouth daily.     atorvastatin 10 MG tablet  Commonly known as:  LIPITOR  Take 10 mg by mouth daily at 6 PM.     b complex vitamins tablet  Take 1 tablet by mouth daily.     buPROPion 300 MG 24 hr tablet  Commonly known as:  WELLBUTRIN XL  Take 300 mg by mouth daily.     busPIRone 15 MG tablet  Commonly known as:  BUSPAR  Take 15 mg by mouth 2 (two) times daily.  calcium carbonate 500 MG chewable tablet  Commonly known as:  TUMS - dosed in mg elemental calcium  Chew 1 tablet by mouth as needed for indigestion or heartburn.     cholecalciferol 1000 UNITS tablet  Commonly known as:  VITAMIN D  Take 2,000 Units by mouth daily.     clonazePAM 0.5 MG tablet  Commonly known as:  KLONOPIN  Take 1 tablet (0.5 mg total) by mouth at bedtime as needed for anxiety.     digoxin 0.125 MG tablet  Commonly known as:  LANOXIN  Take 0.125 mg by mouth daily.      diltiazem 120 MG 24 hr capsule  Commonly known as:  TIAZAC  Take 120 mg by mouth daily.     docusate sodium 100 MG capsule  Commonly known as:  COLACE  Take 1 capsule (100 mg total) by mouth daily as needed for mild constipation.     dutasteride 0.5 MG capsule  Commonly known as:  AVODART  Take 0.5 mg by mouth daily.     escitalopram 20 MG tablet  Commonly known as:  LEXAPRO  Take 20 mg by mouth daily.     eszopiclone 3 MG Tabs  Generic drug:  Eszopiclone  Take 3 mg by mouth at bedtime. Take immediately before bedtime     fluticasone 50 MCG/ACT nasal spray  Commonly known as:  FLONASE  Place 1 spray into both nostrils daily.     folic acid 1 MG tablet  Commonly known as:  FOLVITE  Take 1 mg by mouth daily.     lamoTRIgine 100 MG tablet  Commonly known as:  LAMICTAL  Take 100 mg by mouth 2 (two) times daily. Breakfast and dinner     levothyroxine 150 MCG tablet  Commonly known as:  SYNTHROID, LEVOTHROID  Take 150 mcg by mouth daily before breakfast.     Melatonin 3 MG Tabs  Take 1 tablet by mouth at bedtime.     omega-3 acid ethyl esters 1 G capsule  Commonly known as:  LOVAZA  Take 2 g by mouth 2 (two) times daily.     oxyCODONE 5 MG immediate release tablet  Commonly known as:  Oxy IR/ROXICODONE  Take 1 tablet (5 mg total) by mouth every 6 (six) hours as needed for severe pain.          Diet and Activity recommendation: See Discharge Instructions above   Consults obtained -  Orthopedic Cardiology next psychiatrically   Major procedures and Radiology Reports - PLEASE review detailed and final reports for all details, in brief -     Dg Lumbar Spine Complete  10/15/2014   CLINICAL DATA:  Larey SeatFell tonight.  EXAM: LUMBAR SPINE - COMPLETE 4+ VIEW  COMPARISON:  06/20/2014  FINDINGS: There is no evidence of lumbar spine fracture. Alignment is normal. Intervertebral disc spaces are maintained.  IMPRESSION: Negative.   Electronically Signed   By: Ellery Plunkaniel R Mitchell  M.D.   On: 10/15/2014 05:57   Dg Pelvis 1-2 Views  10/15/2014   CLINICAL DATA:  Fall, loss of consciousness, pain.  EXAM: PELVIS - 1-2 VIEW  COMPARISON:  None.  FINDINGS: There is no evidence of pelvic fracture or diastasis. No pelvic bone lesions are seen.  IMPRESSION: Negative.   Electronically Signed   By: Awilda Metroourtnay  Bloomer   On: 10/15/2014 05:56   Dg Ankle 2 Views Right  10/15/2014   CLINICAL DATA:  60ighty-one year old male with history of fall 1 week ago complaining  of pain in the right ankle.  EXAM: RIGHT ANKLE - 2 VIEW  COMPARISON:  No priors.  FINDINGS: Two views of the right ankle demonstrate no acute displaced fracture, subluxation or dislocation. Numerous phleboliths are noted.  IMPRESSION: 1. No acute findings.   Electronically Signed   By: Trudie Reed M.D.   On: 10/15/2014 07:53   Ct Head Wo Contrast  10/15/2014   CLINICAL DATA:  Felt lightheaded and fell.  EXAM: CT HEAD WITHOUT CONTRAST  CT CERVICAL SPINE WITHOUT CONTRAST  TECHNIQUE: Multidetector CT imaging of the head and cervical spine was performed following the standard protocol without intravenous contrast. Multiplanar CT image reconstructions of the cervical spine were also generated.  COMPARISON:  06/20/2014  FINDINGS: CT HEAD FINDINGS  There is a left circle of Willis aneurysm clip. There is left frontal craniotomy. There is no intracranial hemorrhage, mass or evidence of acute infarction. There is no extra-axial fluid collection. There is moderate atrophy and chronic microvascular ischemic disease. There is no interval change from 06/20/2014. No acute bony abnormalities are evident. The calvarium and skullbase are intact. There is prior right maxillary antrectomy. There is resection of the right terminates.  CT CERVICAL SPINE FINDINGS  There are intact appearances of the vertebral column, pedicles and facet articulations. There is no cervical spine fracture. No acute soft tissue abnormalities are evident.  IMPRESSION: *Negative  for acute intracranial traumatic injury. Moderate atrophy and chronic microvascular ischemic disease. *Negative for acute cervical spine fracture *Left circle of Willis aneurysm clip   Electronically Signed   By: Ellery Plunk M.D.   On: 10/15/2014 03:47   Ct Cervical Spine Wo Contrast  10/15/2014   CLINICAL DATA:  Felt lightheaded and fell.  EXAM: CT HEAD WITHOUT CONTRAST  CT CERVICAL SPINE WITHOUT CONTRAST  TECHNIQUE: Multidetector CT imaging of the head and cervical spine was performed following the standard protocol without intravenous contrast. Multiplanar CT image reconstructions of the cervical spine were also generated.  COMPARISON:  06/20/2014  FINDINGS: CT HEAD FINDINGS  There is a left circle of Willis aneurysm clip. There is left frontal craniotomy. There is no intracranial hemorrhage, mass or evidence of acute infarction. There is no extra-axial fluid collection. There is moderate atrophy and chronic microvascular ischemic disease. There is no interval change from 06/20/2014. No acute bony abnormalities are evident. The calvarium and skullbase are intact. There is prior right maxillary antrectomy. There is resection of the right terminates.  CT CERVICAL SPINE FINDINGS  There are intact appearances of the vertebral column, pedicles and facet articulations. There is no cervical spine fracture. No acute soft tissue abnormalities are evident.  IMPRESSION: *Negative for acute intracranial traumatic injury. Moderate atrophy and chronic microvascular ischemic disease. *Negative for acute cervical spine fracture *Left circle of Willis aneurysm clip   Electronically Signed   By: Ellery Plunk M.D.   On: 10/15/2014 03:47   Dg Knee Complete 4 Views Right  10/15/2014   CLINICAL DATA:  Larey Seat tonight  EXAM: RIGHT KNEE - COMPLETE 4+ VIEW  COMPARISON:  None.  FINDINGS: There is an oblique fracture of the proximal fibular diaphysis. No other acute fractures are evident. No acute soft tissue abnormalities  are evident. Calcification adjacent to the medial femoral condyle likely represents sequela from old MCL trauma. Mild osteoarthritic changes are present.  IMPRESSION: Acute fracture of the proximal fibular diaphysis   Electronically Signed   By: Ellery Plunk M.D.   On: 10/15/2014 05:59    Micro Results  Recent Results (from the past 240 hour(s))  Urine culture     Status: None   Collection Time: 10/15/14  5:03 AM  Result Value Ref Range Status   Specimen Description URINE, CATHETERIZED  Final   Special Requests NONE  Final   Colony Count NO GROWTH Performed at [facility]   Final   Culture NO GROWTH Performed at [facility]   Final   Report Status 10/16/2014 FINAL  Final       Today   Subjective:   [name] [name] today has no headache,no chest abdominal pain,no new weakness tingling or numbness, feels much better today.  Objective:   Blood pressure 171/52, pulse 62, temperature 97.9 F (36.6 C), temperature source Oral, resp. rate 18, height 6' (1.829 m), weight 116.7 kg (257 lb 4.4 oz), SpO2 97 %.   Intake/Output Summary (Last 24 hours) at 10/18/14 1520 Last data filed at 10/18/14 1326  Gross per 24 hour  Intake    340 ml  Output      0 ml  Net    340 ml    Exam Awake Alert, Oriented , No new F.N deficits, Normal affect [name] [name],[name] [name] Supple Neck,No JVD, No cervical lymphadenopathy appriciated.  Symmetrical Chest wall movement, Good air movement bilaterally, CTAB Irregular,No Gallops,Rubs , No Parasternal Heave +ve B.Sounds, Abd Soft, No tenderness, No organomegaly appriciated, No rebound - guarding or rigidity. No Cyanosis, Clubbing or edema,   Data Review   CBC w Diff:  Lab Results  Component Value Date   WBC 5.7 10/17/2014   HGB 11.1* 10/17/2014   HCT 33.2* 10/17/2014   PLT 175 10/17/2014   LYMPHOPCT 16 06/20/2014   MONOPCT 7 06/20/2014   EOSPCT 1 06/20/2014   BASOPCT 0 06/20/2014    CMP:  Lab Results  Component Value  Date   NA 141 10/18/2014   K 3.4* 10/18/2014   CL 106 10/18/2014   CO2 30 10/18/2014   BUN 10 10/18/2014   CREATININE 1.61* 10/18/2014   PROT 7.4 06/20/2014   ALBUMIN 3.4* 06/20/2014   BILITOT 0.5 06/20/2014   ALKPHOS 91 06/20/2014   AST 21 06/20/2014   ALT 18 06/20/2014  .   Total Time in preparing paper work, data evaluation and todays exam - 35 minutes  Cyrene Gharibian M.D on 10/18/2014 at 3:20 PM  [facility] Office  [phone]

## 2014-10-18 NOTE — Progress Notes (Signed)
Pt d/cd to snf for rehab.  Alert and oriented x4.  No c/o pain.  Education will be transported with pt on diet, activity, meds, and follow-up care and instructions.  IV and tele were discontinued.

## 2014-10-18 NOTE — Progress Notes (Signed)
Medicare Important Message given? YES  (If response is "NO", the following Medicare IM given date fields will be blank)  Date Medicare IM given: 10/18/14 Medicare IM given by:  Lerlene Treadwell  

## 2014-10-18 NOTE — Progress Notes (Signed)
MD contacted nursing to obtain settings from pt's CPAP.  Pt refuses to have a CPAP, stated "I haven't worn it in years and I'm not going to wear it.  There are no settings I weaned myself off of it."  Pt also has not been set up with a CPAP here since the pt refused it saying he hasn't used a CPAP in years.

## 2014-10-18 NOTE — Clinical Social Work Note (Signed)
CSW spoke with patients son Cleone SlimRob- [phone] who was concerned [facility] had [facility] and wanted [facility].  CSW spoke with patient who is still agreeable to SNF placement though would prefer to go home for a few days first- CSW explained that this is not an option at this time. Patient is agreeable to going straight to SNF.  Son will transport pt to facility at around 2:30pm  CSW will continue to follow.  Merlyn LotJenna Holoman, LCSWA Clinical Social Worker [phone]

## 2014-10-18 NOTE — Progress Notes (Signed)
Occupational Therapy Treatment Patient Details Name: [name] ProwsChrysler Jaquell Birkeland MRN: [id number] DOB: 1932-08-23 Today's Date: 10/18/2014    History of present illness [name] [name] is a 81 y.o. male, with a history of afib, OSA, emphysema, hypothyroidism, craniotomy/aneurysm clip and severe aortic regurgitation, recent cardiac work up for aortic repair at [facility]. Presents with Syncope and collapse. He recently was at SNF x 1wk to gain strength prior to surgery. Pt wilth fall 2/15 with R fibula fx   OT comments  Pt declining use of CAM boot. Performing sit to stand with supervision from toilet and recliner with verbal cues for technique and assist to control descent. Performed standing grooming before returning to bed. Plan continues to be SNF as pt's wife is not able to physically assist him.  Follow Up Recommendations  SNF;Supervision/Assistance - 24 hour    Equipment Recommendations       Recommendations for Other Services      Precautions / Restrictions Precautions Precautions: Fall Required Braces or Orthoses: Other Brace/Splint (CAM boot as desired, optional) Restrictions Weight Bearing Restrictions: Yes RLE Weight Bearing: Weight bearing as tolerated       Mobility Bed Mobility Overal bed mobility: Modified Independent             General bed mobility comments: returned to supine in bed  Transfers Overall transfer level: Needs assistance Equipment used: Rolling walker (2 wheeled) Transfers: Sit to/from Stand Sit to Stand: Supervision         General transfer comment: cues for hand placement and safety, decreased control of descent.    Balance                                   ADL Overall ADL's : Needs assistance/impaired     Grooming: Min guard;Wash/dry hands;Standing                   StatisticianToilet Transfer: Min guard;Ambulation;RW   Toileting- ArchitectClothing Manipulation and Hygiene: Min guard;Sit to/from stand         General ADL  Comments: Pt sleeping in chair.  Requesting to use bathroom and return to bed. Pt declining use of CAM boot, stating it is more of a hindrance.      Vision                     Perception     Praxis      Cognition   Behavior During Therapy: WFL for tasks assessed/performed Overall Cognitive Status: Impaired/Different from baseline Area of Impairment: Memory Orientation Level: Time   Memory: Decreased short-term memory  Following Commands: Follows one step commands consistently     Problem Solving: Difficulty sequencing      Extremity/Trunk Assessment               Exercises    Shoulder Instructions       General Comments      Pertinent Vitals/ Pain       Pain Assessment: No/denies pain Pain Score: 4  Pain Location: RLE with gait only Pain Intervention(s): Repositioned;Limited activity within patient's tolerance  Home Living                                          Prior Functioning/Environment  Frequency Min 2X/week     Progress Toward Goals  OT Goals(current goals can now be found in the care plan section)  Progress towards OT goals: Progressing toward goals  Acute Rehab OT Goals Time For Goal Achievement: 10/30/14 Potential to Achieve Goals: Good  Plan Discharge plan remains appropriate    Co-evaluation                 End of Session Equipment Utilized During Treatment: Rolling walker;Gait belt   Activity Tolerance Patient tolerated treatment well   Patient Left in bed;with call bell/phone within reach;with bed alarm set   Nurse Communication          Time: [id number] OT Time Calculation (min): 31 min  Charges: OT General Charges $OT Visit: 1 Procedure OT Treatments $Self Care/Home Management : 23-37 mins  Evern Bio 10/18/2014, 1:52 PM  [phone]

## 2014-10-18 NOTE — Progress Notes (Signed)
Physical Therapy Treatment Patient Details Name: [name] ProwsChrysler Nigil Muffley MRN: [id number] DOB: 12/30/1931 Today's Date: 10/18/2014    History of Present Illness Chrysler Cleon Gustinshby is a 81 y.o. male, with a history of afib, OSA, emphysema, hypothyroidism, craniotomy/aneurysm clip and severe aortic regurgitation, recent cardiac work up for aortic repair at [facility]. Presents with Syncope and collapse. He recently was at SNF x 1wk to gain strength prior to surgery. Pt wilth fall 2/15 with R fibula fx    PT Comments    Pt with increased gait and transfers today but remains limited by pain. Pt reports he was [facility] long hall distances at [facility] prior to return home. No dizziness or significant LOB in standing today. Pt reports he prefers not to wear CAM boot. Will continue to follow.   Follow Up Recommendations  Supervision for mobility/OOB;SNF     Equipment Recommendations       Recommendations for Other Services       Precautions / Restrictions Precautions Precautions: Fall Required Braces or Orthoses: Other Brace/Splint (CAM boot as desired, optional) Restrictions Weight Bearing Restrictions: Yes RLE Weight Bearing: Weight bearing as tolerated    Mobility  Bed Mobility Overal bed mobility: Modified Independent                Transfers Overall transfer level: Needs assistance   Transfers: Sit to/from Stand Sit to Stand: Supervision         General transfer comment: cues for hand placement and safety  Ambulation/Gait Ambulation/Gait assistance: Supervision Ambulation Distance (Feet): 24 Feet Assistive device: Rolling walker (2 wheeled);None Gait Pattern/deviations: Step-through pattern;Decreased stride length   Gait velocity interpretation: Below normal speed for age/gender General Gait Details: pt initially using RW with cues for sequence to off weight RLE to decrease pain, however pt would not follow commands and maintained a step through pattern. 2nd gait trial  pt requested to perform without DME no LOB but limited by pain   Stairs            Wheelchair Mobility    Modified Rankin (Stroke Patients Only)       Balance                                    Cognition Arousal/Alertness: Awake/alert Behavior During Therapy: WFL for tasks assessed/performed Overall Cognitive Status: Impaired/Different from baseline   Orientation Level: Time   Memory: Decreased short-term memory Following Commands: Follows one step commands consistently     Problem Solving: Difficulty sequencing      Exercises General Exercises - Lower Extremity Long Arc Quad: AROM;Both;20 reps;Seated Hip Flexion/Marching: AROM;Both;20 reps;Seated    General Comments        Pertinent Vitals/Pain Pain Assessment: 0-10 Pain Score: 4  Pain Location: RLE with gait only Pain Intervention(s): Repositioned;Limited activity within patient's tolerance    Home Living                      Prior Function            PT Goals (current goals can now be found in the care plan section) Progress towards PT goals: Progressing toward goals    Frequency       PT Plan Current plan remains appropriate    Co-evaluation             End of Session Equipment Utilized During Treatment: Gait belt Activity Tolerance: Patient tolerated treatment well  Patient left: in chair;with call bell/phone within reach;with chair alarm set     Time: [phone] PT Time Calculation (min) (ACUTE ONLY): 20 min  Charges:  $Gait Training: 8-22 mins                    G Codes:      Delorse Lek 10/28/2014, 12:28 PM Delaney Meigs, PT [phone]

## 2014-10-21 ENCOUNTER — Non-Acute Institutional Stay (SKILLED_NURSING_FACILITY): Payer: Medicare Other | Admitting: Internal Medicine

## 2014-10-21 DIAGNOSIS — N179 Acute kidney failure, unspecified: Secondary | ICD-10-CM | POA: Diagnosis not present

## 2014-10-21 DIAGNOSIS — G4733 Obstructive sleep apnea (adult) (pediatric): Secondary | ICD-10-CM

## 2014-10-21 DIAGNOSIS — E7800 Pure hypercholesterolemia, unspecified: Secondary | ICD-10-CM

## 2014-10-21 DIAGNOSIS — I482 Chronic atrial fibrillation, unspecified: Secondary | ICD-10-CM

## 2014-10-21 DIAGNOSIS — R9439 Abnormal result of other cardiovascular function study: Secondary | ICD-10-CM | POA: Diagnosis not present

## 2014-10-21 DIAGNOSIS — E038 Other specified hypothyroidism: Secondary | ICD-10-CM

## 2014-10-21 DIAGNOSIS — R55 Syncope and collapse: Secondary | ICD-10-CM | POA: Diagnosis not present

## 2014-10-21 DIAGNOSIS — F39 Unspecified mood [affective] disorder: Secondary | ICD-10-CM

## 2014-10-21 DIAGNOSIS — R059 Cough, unspecified: Secondary | ICD-10-CM

## 2014-10-21 DIAGNOSIS — S82401D Unspecified fracture of shaft of right fibula, subsequent encounter for closed fracture with routine healing: Secondary | ICD-10-CM

## 2014-10-21 DIAGNOSIS — I10 Essential (primary) hypertension: Secondary | ICD-10-CM | POA: Diagnosis not present

## 2014-10-21 DIAGNOSIS — N189 Chronic kidney disease, unspecified: Secondary | ICD-10-CM

## 2014-10-21 DIAGNOSIS — E034 Atrophy of thyroid (acquired): Secondary | ICD-10-CM

## 2014-10-21 DIAGNOSIS — R05 Cough: Secondary | ICD-10-CM

## 2014-10-21 NOTE — Progress Notes (Signed)
MRN: [id number] Name: [name] [name]  Sex: male Age: 81 y.o. DOB: 1932-08-29  PSC #: [name] Facility/Room:107 Level Of Care: SNF Provider: Merrilee Seashore D Emergency Contacts: Extended Emergency Contact Information Primary Emergency Contact: Jonas,[name]  [place] Mobile Phone: [phone] Relation: Son Secondary Emergency Contact: Lares,[name] Address: [address]          [place], [place] Home Phone: [phone] Relation: Spouse     Allergies: Review of patient's allergies indicates no known allergies.  Chief Complaint  Patient presents with  . New Admit To SNF    HPI: Patient is 81 y.o. male who has multiple cardiac issues , who falls frequently and syncopied and broke his L fibula and is admitted to SNF for PT/PT.  Past Medical History  Diagnosis Date  . Atrial fibrillation   . Hypothyroidism   . Plantar fasciitis   . Seasonal allergies   . Decreased testosterone level   . Varicose veins   . Hypertension   . Hypercholesteremia   . Aortic valve insufficiency   . Depression   . Heart murmur   . DVT (deep venous thrombosis) ~ 2013    "BLE"  . OSA (obstructive sleep apnea)     "suppose to wear mask; I throw it off in my sleep" (10/15/2014)  . COPD (chronic obstructive pulmonary disease)   . Emphysema of lung   . History of blood transfusion 1960    "lots; related to an accident"  . Arthritis     "wee bit; knees, elbows" (10/15/2014)  . Anxiety   . Chronic kidney disease     "down to ~ 1/2 of their regular use; I see kidney dr. in [place]" (10/15/2014)  . Falls frequently     > 20 times in the last year/notes 10/15/2014  . Syncope and collapse "several times"    Past Surgical History  Procedure Laterality Date  . Abdominal aortic aneurysm repair  01/2006; 03/10/2006    Hattie Perch 01/12/2011  . Tonsillectomy    . Hernia repair    . Laparoscopic incisional / umbilical / ventral hernia repair  02/2010     VHR w/mesh/notes 03/28/2010  . Laparoscopic cholecystectomy  08/2009    w/IOC/notes 09/18/2009  . Ercp  11/2009    Hattie Perch 12/05/2009  . Cardiac catheterization  1990's X 1; 09/2014  . Brain surgery  1960 X 4    "S/P got my skull busted"      Medication List       This list is accurate as of: 10/21/14 11:59 PM.  Always use your most recent med list.               acetaminophen 325 MG tablet  Commonly known as:  TYLENOL  Take 2 tablets (650 mg total) by mouth every 6 (six) hours as needed for mild pain (or Fever >/= 101).     albuterol 108 (90 BASE) MCG/ACT inhaler  Commonly known as:  PROVENTIL HFA;VENTOLIN HFA  Inhale 2 puffs into the lungs every 6 (six) hours as needed for wheezing or shortness of breath.     apixaban 2.5 MG Tabs tablet  Commonly known as:  ELIQUIS  Take 1 tablet (2.5 mg total) by mouth 2 (two) times daily.     aspirin 81 MG tablet  Take 81 mg by mouth daily.     atorvastatin 10 MG tablet  Commonly known as:  LIPITOR  Take 10 mg by mouth daily at 6 PM.  b complex vitamins tablet  Take 1 tablet by mouth daily.     buPROPion 300 MG 24 hr tablet  Commonly known as:  WELLBUTRIN XL  Take 300 mg by mouth daily.     busPIRone 15 MG tablet  Commonly known as:  BUSPAR  Take 15 mg by mouth 2 (two) times daily.     calcium carbonate 500 MG chewable tablet  Commonly known as:  TUMS - dosed in mg elemental calcium  Chew 1 tablet by mouth as needed for indigestion or heartburn.     cholecalciferol 1000 UNITS tablet  Commonly known as:  VITAMIN D  Take 2,000 Units by mouth daily.     clonazePAM 0.5 MG tablet  Commonly known as:  KLONOPIN  Take 1 tablet (0.5 mg total) by mouth at bedtime as needed for anxiety.     digoxin 0.125 MG tablet  Commonly known as:  LANOXIN  Take 0.125 mg by mouth daily.     diltiazem 120 MG 24 hr capsule  Commonly known as:  TIAZAC  Take 120 mg by mouth daily.     docusate sodium 100 MG capsule  Commonly known as:  COLACE   Take 1 capsule (100 mg total) by mouth daily as needed for mild constipation.     dutasteride 0.5 MG capsule  Commonly known as:  AVODART  Take 0.5 mg by mouth daily.     escitalopram 20 MG tablet  Commonly known as:  LEXAPRO  Take 20 mg by mouth daily.     eszopiclone 3 MG Tabs  Generic drug:  Eszopiclone  Take 3 mg by mouth at bedtime. Take immediately before bedtime     fluticasone 50 MCG/ACT nasal spray  Commonly known as:  FLONASE  Place 1 spray into both nostrils daily.     folic acid 1 MG tablet  Commonly known as:  FOLVITE  Take 1 mg by mouth daily.     lamoTRIgine 100 MG tablet  Commonly known as:  LAMICTAL  Take 100 mg by mouth 2 (two) times daily. Breakfast and dinner     levothyroxine 150 MCG tablet  Commonly known as:  SYNTHROID, LEVOTHROID  Take 150 mcg by mouth daily before breakfast.     Melatonin 3 MG Tabs  Take 1 tablet by mouth at bedtime.     omega-3 acid ethyl esters 1 G capsule  Commonly known as:  LOVAZA  Take 2 g by mouth 2 (two) times daily.     oxyCODONE 5 MG immediate release tablet  Commonly known as:  Oxy IR/ROXICODONE  Take 1 tablet (5 mg total) by mouth every 6 (six) hours as needed for severe pain.        No orders of the defined types were placed in this encounter.    Immunization History  Administered Date(s) Administered  . Influenza, High Dose Seasonal PF 05/29/2013  . Influenza-Unspecified 04/30/2014  . Pneumococcal Polysaccharide-23 04/29/2010    History  Substance Use Topics  . Smoking status: Former Smoker -- 2.00 packs/day for 22 years    Types: Cigarettes    Quit date: 04/29/1974  . Smokeless tobacco: Never Used  . Alcohol Use: No    Family history is noncontributory    Review of Systems  DATA OBTAINED: from patient GENERAL:  no fevers, fatigue, appetite changes SKIN: No itching, rash EYES: No eye pain, redness, discharge EARS: No earache, tinnitus, change in hearing NOSE: No congestion, drainage or  bleeding  MOUTH/THROAT: No mouth or  tooth pain, No sore throat RESPIRATORY: No cough, wheezing, SOB CARDIAC: No chest pain, palpitations, lower extremity edema  GI: No abdominal pain, No N/V/D or constipation, No heartburn or reflux  GU: No dysuria, frequency or urgency, or incontinence  MUSCULOSKELETAL: No unrelieved bone/joint pain NEUROLOGIC: No headache, dizziness or focal weakness PSYCHIATRIC: No overt anxiety or sadness,mild dementia No behavior issue.   Filed Vitals:   10/21/14 1916  BP: 171/52  Pulse: 62  Temp: 97.9 F (36.6 C)  Resp: 18    Physical Exam  GENERAL APPEARANCE: Alert, conversant,  No acute distress.  SKIN: No diaphoresis rash HEAD: Normocephalic, atraumatic  EYES: Conjunctiva/lids clear. Pupils round, reactive. EOMs intact.  EARS: External exam WNL, canals clear. Hearing grossly normal.  NOSE: No deformity or discharge.  MOUTH/THROAT: Lips w/o lesions  RESPIRATORY: Breathing is even, unlabored. Lung sounds are clear   CARDIOVASCULAR: Heart RRR no murmurs, rubs or gallops. No peripheral edema.   GASTROINTESTINAL: Abdomen is soft, non-tender, not distended w/ normal bowel sounds. GENITOURINARY: Bladder non tender, not distended  MUSCULOSKELETAL: No abnormal joints or musculature NEUROLOGIC:  Cranial nerves 2-12 grossly intact. Moves all extremities  PSYCHIATRIC: Mood and affect appropriate to situation, no behavioral issues  Patient Active Problem List   Diagnosis Date Noted  . Abnormal carotid duplex scan 10/28/2014  . OSA (obstructive sleep apnea) 10/28/2014  . Mood disorder with psychosis 10/28/2014  . Episodes of formed visual hallucinations 10/16/2014  . Faintness   . Syncope 10/15/2014  . Acute on chronic renal failure 10/15/2014  . Right fibular fracture 10/15/2014  . Hypoxia 10/15/2014  . Syncope and collapse 10/15/2014  . CAD (coronary artery disease), native coronary artery 09/29/2014  . Falls frequently 09/29/2014  . Hypertension   .  Hypercholesteremia   . Aortic valve insufficiency   . Depression   . Atrial fibrillation, chronic 06/20/2014  . Hypothyroidism 06/20/2014  . DOE (dyspnea on exertion) 06/20/2014  . Cough 04/29/2014  . Shortness of breath 04/29/2014    CBC    Component Value Date/Time   WBC 5.7 10/17/2014 0500   RBC 3.52* 10/17/2014 0500   HGB 11.1* 10/17/2014 0500   HCT 33.2* 10/17/2014 0500   PLT 175 10/17/2014 0500   MCV 94.3 10/17/2014 0500   LYMPHSABS 1.5 06/20/2014 1142   MONOABS 0.7 06/20/2014 1142   EOSABS 0.1 06/20/2014 1142   BASOSABS 0.0 06/20/2014 1142    CMP     Component Value Date/Time   NA 141 10/18/2014 0535   K 3.4* 10/18/2014 0535   CL 106 10/18/2014 0535   CO2 30 10/18/2014 0535   GLUCOSE 99 10/18/2014 0535   BUN 10 10/18/2014 0535   CREATININE 1.61* 10/18/2014 0535   CALCIUM 8.5 10/18/2014 0535   PROT 7.4 06/20/2014 1142   ALBUMIN 3.4* 06/20/2014 1142   AST 21 06/20/2014 1142   ALT 18 06/20/2014 1142   ALKPHOS 91 06/20/2014 1142   BILITOT 0.5 06/20/2014 1142   GFRNONAA 38* 10/18/2014 0535   GFRAA 45* 10/18/2014 0535    Assessment and Plan  Syncope Multiple possible etiologies: Orthostasis, Dehydration, Polypharmacy and cardiac, versus vertigo . - This is most likely related to his severe aortic regurgitation, recent echo done at [facility] showing EF 50%, severe AR - Patient was orthostatic initially on admission  - Cardiology consult appreciated, they think cardiovascular etiologies are unlikely. - Neurology consult appreciated, EEG was obtained as their recommendation, normal EEG. - carotid Doppler showing Right: 40-59% ICA stenosis. Left: Common and internal  carotid artery not insonated. Bilateral: Vertebral artery flow is antegrade. Will need repeat carotid Doppler in 6 months as discussed with Dr. Arbie Cookey from vascular surgery.   Acute on chronic renal failure Multiple possible etiologies: Orthostasis, Dehydration, Polypharmacy and cardiac,  versus vertigo . - This is most likely related to his severe aortic regurgitation, recent echo done at [facility] showing EF 50%, severe AR - Patient was orthostatic initially on admission  - Cardiology consult appreciated, they think cardiovascular etiologies are unlikely. - Neurology consult appreciated, EEG was obtained as their recommendation, normal EEG. - carotid Doppler showing Right: 40-59% ICA stenosis. Left: Common and internal carotid artery not insonated. Bilateral: Vertebral artery flow is antegrade. Will need repeat carotid Doppler in 6 months as discussed with Dr. Arbie Cookey from vascular surgery.   Right fibular fracture Ortho consult , PT OT, weightbearing as tolerated on right lower extremity, may use cam boot on ambulation.    Hypertension Not controlled on current;will not use ACE, will add norvasc   Atrial fibrillation, chronic Rate controlled and on eliquis   Hypothyroidism Continue replacement; TSH 3.73   Hypercholesteremia Continue lovaza and lipitor   OSA (obstructive sleep apnea) Pt refuses CPAP for years   Abnormal carotid duplex scan Carotid Doppler showing Right: 40-59% ICA stenosis. Left: Common and internal carotid artery not insonated. Bilateral: Vertebral artery flow is antegrade. Will need repeat carotid Doppler in 6 months as discussed with Dr. Arbie Cookey from vascular surgery. - We'll hold on CT head and neck angiogram , given poor kidney function, and patient is recovering from rhabdomyolysis     Mood disorder with psychosis Pt on multiple meds for depression and psychosis, had visual psychosis in hospital and psych was consulted.   Cough Workup negative, felt to be chronic     Margit Hanks, MD

## 2014-10-25 ENCOUNTER — Ambulatory Visit: Payer: Medicare Other | Admitting: Cardiovascular Disease

## 2014-10-28 ENCOUNTER — Encounter: Payer: Self-pay | Admitting: Internal Medicine

## 2014-10-28 DIAGNOSIS — R9439 Abnormal result of other cardiovascular function study: Secondary | ICD-10-CM | POA: Insufficient documentation

## 2014-10-28 DIAGNOSIS — F39 Unspecified mood [affective] disorder: Secondary | ICD-10-CM | POA: Insufficient documentation

## 2014-10-28 DIAGNOSIS — G4733 Obstructive sleep apnea (adult) (pediatric): Secondary | ICD-10-CM | POA: Insufficient documentation

## 2014-10-28 NOTE — Assessment & Plan Note (Signed)
Ortho consult , PT OT, weightbearing as tolerated on right lower extremity, may use cam boot on ambulation.

## 2014-10-28 NOTE — Assessment & Plan Note (Signed)
Continue lovaza and lipitor

## 2014-10-28 NOTE — Assessment & Plan Note (Signed)
Pt on multiple meds for depression and psychosis, had visual psychosis in hospital and psych was consulted.

## 2014-10-28 NOTE — Assessment & Plan Note (Signed)
Carotid Doppler showing Right: 40-59% ICA stenosis. Left: Common and internal carotid artery not insonated. Bilateral: Vertebral artery flow is antegrade. Will need repeat carotid Doppler in 6 months as discussed with Dr. Arbie CookeyEarly from vascular surgery. - We'll hold on CT head and neck angiogram , given poor kidney function, and patient is recovering from rhabdomyolysis

## 2014-10-28 NOTE — Assessment & Plan Note (Signed)
Not controlled on current;will not use ACE, will add norvasc

## 2014-10-28 NOTE — Assessment & Plan Note (Signed)
Rate controlled and on eliquis 

## 2014-10-28 NOTE — Assessment & Plan Note (Signed)
Multiple possible etiologies: Orthostasis, Dehydration, Polypharmacy and cardiac, versus vertigo . - This is most likely related to his severe aortic regurgitation, recent echo done at [facility] showing EF 50%, severe AR - Patient was orthostatic initially on admission  - Cardiology consult appreciated, they think cardiovascular etiologies are unlikely. - Neurology consult appreciated, EEG was obtained as their recommendation, normal EEG. - carotid Doppler showing Right: 40-59% ICA stenosis. Left: Common and internal carotid artery not insonated. Bilateral: Vertebral artery flow is antegrade. Will need repeat carotid Doppler in 6 months as discussed with Dr. Arbie CookeyEarly from vascular surgery.

## 2014-10-28 NOTE — Assessment & Plan Note (Signed)
Pt refuses CPAP for years

## 2014-10-28 NOTE — Assessment & Plan Note (Signed)
Workup negative, felt to be chronic

## 2014-10-28 NOTE — Assessment & Plan Note (Signed)
Continue replacement; TSH 3.73

## 2014-10-28 NOTE — Assessment & Plan Note (Signed)
Multiple possible etiologies: Orthostasis, Dehydration, Polypharmacy and cardiac, versus vertigo . - This is most likely related to his severe aortic regurgitation, recent echo done at [facility] showing EF 50%, severe AR - Patient was orthostatic initially on admission  - Cardiology consult appreciated, they think cardiovascular etiologies are unlikely. - Neurology consult appreciated, EEG was obtained as their recommendation, normal EEG. - carotid Doppler showing Right: 40-59% ICA stenosis. Left: Common and internal carotid artery not insonated. Bilateral: Vertebral artery flow is antegrade. Will need repeat carotid Doppler in 6 months as discussed with Dr. Early from vascular surgery. 

## 2014-11-08 ENCOUNTER — Encounter: Payer: Self-pay | Admitting: Cardiothoracic Surgery

## 2014-11-08 ENCOUNTER — Institutional Professional Consult (permissible substitution) (INDEPENDENT_AMBULATORY_CARE_PROVIDER_SITE_OTHER): Payer: Medicare Other | Admitting: Cardiothoracic Surgery

## 2014-11-08 VITALS — BP 158/65 | HR 78 | Resp 20 | Ht 72.0 in | Wt 236.0 lb

## 2014-11-08 DIAGNOSIS — I351 Nonrheumatic aortic (valve) insufficiency: Secondary | ICD-10-CM | POA: Diagnosis not present

## 2014-11-08 DIAGNOSIS — M79675 Pain in left toe(s): Secondary | ICD-10-CM | POA: Diagnosis not present

## 2014-11-08 DIAGNOSIS — B351 Tinea unguium: Secondary | ICD-10-CM | POA: Diagnosis not present

## 2014-11-08 DIAGNOSIS — I4891 Unspecified atrial fibrillation: Secondary | ICD-10-CM | POA: Diagnosis not present

## 2014-11-08 DIAGNOSIS — M79674 Pain in right toe(s): Secondary | ICD-10-CM | POA: Diagnosis not present

## 2014-11-08 DIAGNOSIS — I25119 Atherosclerotic heart disease of native coronary artery with unspecified angina pectoris: Secondary | ICD-10-CM | POA: Diagnosis not present

## 2014-11-08 DIAGNOSIS — L089 Local infection of the skin and subcutaneous tissue, unspecified: Secondary | ICD-10-CM | POA: Diagnosis not present

## 2014-11-08 NOTE — Progress Notes (Signed)
[address]       [place] [address]             [phone]                    [name] Stellan [facility] Medical Record #[id number] Date of Birth: Oct 25, 1931  Referring: Oralia Manis,* Primary Care: Cain Saupe, MD  Chief Complaint:    Chief Complaint  Patient presents with  . Coronary Artery Disease    Surgical eval./ 2nd opinion for possible CABG/AVR   . Aortic Insuffiency  . Atrial Fibrillation    History of Present Illness:    [name] [name] 81 y.o. male is seen in the office   for second opinion concerning aortic valve replacement and coronary artery bypass grafting. Patient is an 81 year old seen as a second opinion. He has a history of aortic valve insufficiency atrial fibrillation and hypertension. The patient was recently hospitalized at [facility] in January with increasing dyspnea on exertion and difficulty carrying objects and caring for his usual activities. He reports being very unsteady on his feet. His no      Current Activity/ Functional Status:  Patient is not independent with mobility/ambulation, transfers, ADL's, IADL's. patient is ambulatory with a walker but is very slow, came in the office in a wheelchair. His son notes that he has been walking up to 200 feet at a time. His son also notes that he said numerous falls up to 20 in the last month. He denies chest pain  Cv(v) - severe AI - Ms. [name] presented for pre-hydration prior to RHC/LHC. TTE on 09/18/2014 showed an LVEF 50%, LVIDs 4.4 cm, severe AR. RHC/LHC on 09/20/2014 showed a RA 11 RV 50/13 PA 47/25 (33) PCWP 18 CI 1.6 PVR 4.0 and a 70% distal RCA lesion. After discussion among the heart team, given his frailty, the decision was made to send him to a SNF given his frailty and have him re-evaluated in clinic by Dr. [name] for CABG+AVR. He would likely be a poor TAVR candidate given the absence of Ca in his AoV annulus (CT Chest obtained while in house).   [name]  Score: At the time of surgery this patient's most appropriate activity status/level should be described as: []     0    Normal activity, no symptoms []     1    Restricted in physical strenuous activity but ambulatory, able to do out light work []     2    Ambulatory and capable of self care, unable to do work activities, up and about               >50 % of waking hours                              [x]     3    Only limited self care, in bed greater than 50% of waking hours []     4    Completely disabled, no self care, confined to bed or chair []     5    Moribund   Past Medical History  Diagnosis Date  . Atrial fibrillation   . Hypothyroidism   . Plantar fasciitis   . Seasonal allergies   . Decreased testosterone level   . Varicose veins   . Hypertension   . Hypercholesteremia   . Aortic valve insufficiency   . Depression   .  Heart murmur   . DVT (deep venous thrombosis) ~ 2013    "BLE"  . OSA (obstructive sleep apnea)     "suppose to wear mask; I throw it off in my sleep" (10/15/2014)  . COPD (chronic obstructive pulmonary disease)   . Emphysema of lung   . History of blood transfusion 1960    "lots; related to an accident"  . Arthritis     "wee bit; knees, elbows" (10/15/2014)  . Anxiety   . Chronic kidney disease     "down to ~ 1/2 of their regular use; I see kidney dr. in [place]" (10/15/2014)  . Falls frequently     > 20 times in the last year/notes 10/15/2014  . Syncope and collapse "several times"    Past Surgical History  Procedure Laterality Date  . Abdominal aortic aneurysm repair  01/2006; 03/10/2006    Hattie Perch 01/12/2011  . Tonsillectomy    . Hernia repair    . Laparoscopic incisional / umbilical / ventral hernia repair  02/2010    VHR w/mesh/notes 03/28/2010  . Laparoscopic cholecystectomy  08/2009    w/IOC/notes 09/18/2009  . Ercp  11/2009    Hattie Perch 12/05/2009  . Cardiac catheterization  1990's X 1; 09/2014  . Brain surgery  1960 X 4    "S/P got my skull busted"    previous head injury 1960 to hospitalize 93 days  Family History  Problem Relation Age of Onset  . Heart disease Father   . Rheum arthritis Mother   . Rheum arthritis Father   . Cancer Sister     History   Social History  . Marital Status: Married    Spouse Name: N/A  . Number of Children: N/A  . Years of Education: N/A   Occupational History  . Not on file.   Social History Main Topics  . Smoking status: Former Smoker -- 2.00 packs/day for 22 years    Types: Cigarettes    Quit date: 04/29/1974  . Smokeless tobacco: Never Used  . Alcohol Use: No  . Drug Use: No  . Sexual Activity: Not Currently   Other Topics Concern  . Not on file   Social History Narrative    History  Smoking status  . Former Smoker -- 2.00 packs/day for 22 years  . Types: Cigarettes  . Quit date: 04/29/1974  Smokeless tobacco  . Never Used    History  Alcohol Use No     No Known Allergies  Current Outpatient Prescriptions  Medication Sig Dispense Refill  . acetaminophen (TYLENOL) 325 MG tablet Take 2 tablets (650 mg total) by mouth every 6 (six) hours as needed for mild pain (or Fever >/= 101).    Marland Kitchen albuterol (PROVENTIL HFA;VENTOLIN HFA) 108 (90 BASE) MCG/ACT inhaler Inhale 2 puffs into the lungs every 6 (six) hours as needed for wheezing or shortness of breath.    Marland Kitchen apixaban (ELIQUIS) 2.5 MG TABS tablet Take 1 tablet (2.5 mg total) by mouth 2 (two) times daily. 60 tablet 3  . aspirin 81 MG tablet Take 81 mg by mouth daily.    Marland Kitchen atorvastatin (LIPITOR) 10 MG tablet Take 10 mg by mouth daily at 6 PM.    . b complex vitamins tablet Take 1 tablet by mouth daily.    Marland Kitchen buPROPion (WELLBUTRIN XL) 300 MG 24 hr tablet Take 300 mg by mouth daily.    . busPIRone (BUSPAR) 15 MG tablet Take 15 mg by mouth 2 (two) times daily.    Marland Kitchen  calcium carbonate (TUMS - DOSED IN MG ELEMENTAL CALCIUM) 500 MG chewable tablet Chew 1 tablet by mouth as needed for indigestion or heartburn.    . cholecalciferol  (VITAMIN D) 1000 UNITS tablet Take 2,000 Units by mouth daily.    . clonazePAM (KLONOPIN) 0.5 MG tablet Take 1 tablet (0.5 mg total) by mouth at bedtime as needed for anxiety. 30 tablet 0  . digoxin (LANOXIN) 0.125 MG tablet Take 0.125 mg by mouth daily.    Marland Kitchen diltiazem (TIAZAC) 120 MG 24 hr capsule Take 120 mg by mouth daily.    Marland Kitchen docusate sodium (COLACE) 100 MG capsule Take 1 capsule (100 mg total) by mouth daily as needed for mild constipation. 10 capsule 0  . dutasteride (AVODART) 0.5 MG capsule Take 0.5 mg by mouth daily.    Marland Kitchen escitalopram (LEXAPRO) 20 MG tablet Take 20 mg by mouth daily.    . Eszopiclone (ESZOPICLONE) 3 MG TABS Take 3 mg by mouth at bedtime. Take immediately before bedtime    . fluticasone (FLONASE) 50 MCG/ACT nasal spray Place 1 spray into both nostrils daily. 9.9 g 2  . folic acid (FOLVITE) 1 MG tablet Take 1 mg by mouth daily.    Marland Kitchen lamoTRIgine (LAMICTAL) 100 MG tablet Take 100 mg by mouth 2 (two) times daily. Breakfast and dinner    . levothyroxine (SYNTHROID, LEVOTHROID) 150 MCG tablet Take 150 mcg by mouth daily before breakfast.    . Melatonin 3 MG TABS Take 1 tablet by mouth at bedtime.    Marland Kitchen omega-3 acid ethyl esters (LOVAZA) 1 G capsule Take 2 g by mouth 2 (two) times daily.    Marland Kitchen oxyCODONE (OXY IR/ROXICODONE) 5 MG immediate release tablet Take 1 tablet (5 mg total) by mouth every 6 (six) hours as needed for severe pain. 20 tablet 0   No current facility-administered medications for this visit.      Review of Systems:     Cardiac Review of Systems: Y or N  Chest Pain [ n   ]  Resting SOB [ y  ] Exertional SOB  [ y ]  Orthopnea [place].Etienne  ]   Pedal Edema [ y  ]    Palpitations [ y ] Syncope  [ n ]   Presyncope [ y  ]  General Review of Systems: [Y] = yes [  ]=no Constitional: recent weight change [ y ];  Wt loss over the last 3 months [   ] anorexia [  ]; fatigue [  ]; nausea [  ]; night sweats [  ]; fever [  ]; or chills [  ];          Dental: poor dentition[  ]; Last  Dentist visit:   Eye : blurred vision [  ]; diplopia [   ]; vision changes [  ];  Amaurosis fugax[  ]; Resp: cough [ y ];  wheezingy[  ];  hemoptysis[n  ]; shortness of breath[ y ]; paroxysmal nocturnal dyspnea[ y ]; dyspnea on exertion[  y]; or orthopnea[ y ];  GI:  gallstones[  ], vomiting[  ];  dysphagia[  ]; melena[  ];  hematochezia [  ]; heartburn[  ];   Hx of  Colonoscopy[y  ]; GU: kidney stones [  ]; hematuria[  ];   dysuria [  ];  nocturia[  ];  history of     obstruction [  ]; urinary frequency [  ]  Skin: rash, swelling[  ];, hair loss[  ];  peripheral edema[  ];  or itching[  ]; Musculosketetal: myalgias[  ];  joint swelling[  ];  joint erythema[  ];  joint pain[  ];  back pain[  ];  Heme/Lymph: bruising[ y ];  bleeding[ n ];  anemia[ n ];  Neuro: TIA[  ];  headaches[  ];  stroke[n  ];  vertigo[ y ];  seizures[n  ];   paresthesias[  ];  difficulty walking[y  ];  Psych:depression[y  ]; anxiety[y  ];  Endocrine: diabetes[n  ];  thyroid dysfunction[ y ];  Immunizations: Flu up to date [  ]; Pneumococcal up to date [  ];  Other:  Physical Exam: BP 158/65 mmHg  Pulse 78  Resp 20  Ht 6' (1.829 m)  Wt 236 lb (107.049 kg)  BMI 32.00 kg/m2  SpO2 97%  PHYSICAL EXAMINATION: General appearance: alert, cooperative and slowed mentation Head: Normocephalic, without obvious abnormality, atraumatic Neck: no adenopathy, no carotid bruit, no JVD, supple, symmetrical, trachea midline, thyroid not enlarged, symmetric, no tenderness/mass/nodules and scar from left carotid surgery Lymph nodes: Cervical, supraclavicular, and axillary nodes normal. Resp: clear to auscultation bilaterally Back: symmetric, no curvature. ROM normal. No CVA tenderness. Cardio: irregularly irregular rhythm GI: soft, non-tender; bowel sounds normal; no masses,  no organomegaly Extremities: edema mild bilaterial edema Neurologic: Mental status: Alert, oriented, thought content appropriate, alertness: alert,  mildly confused about some events and clear about others Coordination:  walks very slowly mild cog wheel rigidity  arma/hands Gait: Abnormal  Diagnostic Studies & Laboratory data:     Recent Radiology Findings:   Dg Lumbar Spine Complete  10/15/2014   CLINICAL DATA:  Larey Seat tonight.  EXAM: LUMBAR SPINE - COMPLETE 4+ VIEW  COMPARISON:  06/20/2014  FINDINGS: There is no evidence of lumbar spine fracture. Alignment is normal. Intervertebral disc spaces are maintained.  IMPRESSION: Negative.   Electronically Signed   By: Ellery Plunk M.D.   On: 10/15/2014 05:57   Dg Pelvis 1-2 Views  10/15/2014   CLINICAL DATA:  Fall, loss of consciousness, pain.  EXAM: PELVIS - 1-2 VIEW  COMPARISON:  None.  FINDINGS: There is no evidence of pelvic fracture or diastasis. No pelvic bone lesions are seen.  IMPRESSION: Negative.   Electronically Signed   By: Awilda Metro   On: 10/15/2014 05:56   Dg Ankle 2 Views Right  10/15/2014   CLINICAL DATA:  81 year old male with history of fall 1 week ago complaining of pain in the right ankle.  EXAM: RIGHT ANKLE - 2 VIEW  COMPARISON:  No priors.  FINDINGS: Two views of the right ankle demonstrate no acute displaced fracture, subluxation or dislocation. Numerous phleboliths are noted.  IMPRESSION: 1. No acute findings.   Electronically Signed   By: Trudie Reed M.D.   On: 10/15/2014 07:53   Ct Head Wo Contrast  10/15/2014   CLINICAL DATA:  Felt lightheaded and fell.  EXAM: CT HEAD WITHOUT CONTRAST  CT CERVICAL SPINE WITHOUT CONTRAST  TECHNIQUE: Multidetector CT imaging of the head and cervical spine was performed following the standard protocol without intravenous contrast. Multiplanar CT image reconstructions of the cervical spine were also generated.  COMPARISON:  06/20/2014  FINDINGS: CT HEAD FINDINGS  There is a left circle of Willis aneurysm clip. There is left frontal craniotomy. There is no intracranial hemorrhage, mass or evidence of acute infarction.  There is no extra-axial fluid collection. There is moderate atrophy and  chronic microvascular ischemic disease. There is no interval change from 06/20/2014. No acute bony abnormalities are evident. The calvarium and skullbase are intact. There is prior right maxillary antrectomy. There is resection of the right terminates.  CT CERVICAL SPINE FINDINGS  There are intact appearances of the vertebral column, pedicles and facet articulations. There is no cervical spine fracture. No acute soft tissue abnormalities are evident.  IMPRESSION: *Negative for acute intracranial traumatic injury. Moderate atrophy and chronic microvascular ischemic disease. *Negative for acute cervical spine fracture *Left circle of Willis aneurysm clip   Electronically Signed   By: Ellery Plunk M.D.   On: 10/15/2014 03:47   Ct Cervical Spine Wo Contrast  10/15/2014   CLINICAL DATA:  Felt lightheaded and fell.  EXAM: CT HEAD WITHOUT CONTRAST  CT CERVICAL SPINE WITHOUT CONTRAST  TECHNIQUE: Multidetector CT imaging of the head and cervical spine was performed following the standard protocol without intravenous contrast. Multiplanar CT image reconstructions of the cervical spine were also generated.  COMPARISON:  06/20/2014  FINDINGS: CT HEAD FINDINGS  There is a left circle of Willis aneurysm clip. There is left frontal craniotomy. There is no intracranial hemorrhage, mass or evidence of acute infarction. There is no extra-axial fluid collection. There is moderate atrophy and chronic microvascular ischemic disease. There is no interval change from 06/20/2014. No acute bony abnormalities are evident. The calvarium and skullbase are intact. There is prior right maxillary antrectomy. There is resection of the right terminates.  CT CERVICAL SPINE FINDINGS  There are intact appearances of the vertebral column, pedicles and facet articulations. There is no cervical spine fracture. No acute soft tissue abnormalities are evident.  IMPRESSION:  *Negative for acute intracranial traumatic injury. Moderate atrophy and chronic microvascular ischemic disease. *Negative for acute cervical spine fracture *Left circle of Willis aneurysm clip   Electronically Signed   By: Ellery Plunk M.D.   On: 10/15/2014 03:47   Dg Knee Complete 4 Views Right  10/15/2014   CLINICAL DATA:  Larey Seat tonight  EXAM: RIGHT KNEE - COMPLETE 4+ VIEW  COMPARISON:  None.  FINDINGS: There is an oblique fracture of the proximal fibular diaphysis. No other acute fractures are evident. No acute soft tissue abnormalities are evident. Calcification adjacent to the medial femoral condyle likely represents sequela from old MCL trauma. Mild osteoarthritic changes are present.  IMPRESSION: Acute fracture of the proximal fibular diaphysis   Electronically Signed   By: Ellery Plunk M.D.   On: 10/15/2014 05:59     I have independently reviewed the above radiologic studies.  CTscan at [facility]: Report only Chest CT without contrast  Indication: I48.2 Chronic atrial fibrillation, I71.4 Abdominal aortic aneurysm, without rupture, preop  Comparison: March 27, 2012  Protocol:  Volumetric non-contrast chest CT was performed from the lower neck through the adrenal glands.  This study was acquired without intravenous contrast given the patients indications for the examination. 2D multiplanar reconstruction including sagittal and coronal reconstruction was utilized to enhance assessment of the vasculature including sagittal and coronal reconstructed images. 5 x 5 mm axial maximum intensity projection images (MIPS) were reconstructed to facilitate lung nodule detection.  Findings: Vasculature The ascending thoracic aorta measures up to 4.0 cm. There are moderate mural calcifications. Fat planes between the descending aorta, pulmonary artery and right ventricle and the sternum are preserved. The right ventricle is 2.1 cm posterior to the xiphoid. Brachiocephalic vein is 2  cm posterior to the sternal cortex. The ascending aorta is 4.3 cm posterior to the  sternal cortex. Moderate coronary artery calcifications.  Non vascular structures: Debris is seen within the mid trachea. The central airways are patent. Again seen is linear opacities in the right lower lobe with mild bronchiectasis with elevation the right hemidiaphragm, likely related to scarring. Mild centrilobular emphysema. Unchanged 4 mm right middle lobe pulmonary nodule (image 248, series 4). Unchanged 4 mm right lower lobe pulmonary nodule abutting the accessory fissure (image 261, series 4) . Unchanged 4 mm subpleural right lower lobe pulmonary nodule (image 298, series 4). Unchanged 5 mm right lower lobe pulmonary nodule (image 186, series 4). No pleural effusion.  Neck base is normal.  Mild cardiomegaly. No pericardial effusion. No mediastinal, hilar, or axillary lymphadenopathy.  No suspicious lytic or sclerotic osseous lesions. Multilevel degenerative changes of the thoracolumbar spine.  Small hiatal hernia. The patient is status post cholecystectomy. Surgical clips are seen in the gallbladder fossa. Limited images of the upper abdomen are unremarkable.  Impression: 1.Sternotomy measurements as described above. 2.Coronary artery calcifications. 3.Stable pulmonary nodules.   Electronically Reviewed by:  Larry Sierras, MD Electronically Reviewed on:  09/19/2014 11:20 AM  I have reviewed the images and concur with the above findings.  Electronically Signed by:  Beckie Salts, MD Electronically Signed on:  09/20/2014 12:06 PM  ECHO:  [facility]            [place], [place]                                                      [id number]    DOB: June 11, 1932                CARDIAC DIAGNOSTIC UNIT               Date: 09/18/2014 16:00:00                                                      Adult     Male   Age: 61                  ECHO-DOPPLER REPORT                  Inpatient                                                      [id number] ---------------------------------------------------- MD1: Ward, Lajoyce Corners     STUDY: Chest Wall          TAPE: 0000:00:0:00:00 BP: 143/66      ECHO: Yes   DOPPLER: Yes  FILE: [id number]:   HR: 98     COLOR: Yes  CONTRAST: No      MACHINE: IE33 #17  Height: 72 in RV BIOPSY: No         3D: No   SOUND QLTY: Moderate  Weight: 259 lbs    MEDIUM: None  BSA: 2.44,  BMI: 35.10 ------------------------------------------------------------------------------    HISTORY:  Arrhythmia and Valvular disease     REASON: Assess Aortic Regurgitation INDICATION: I48.2 - Chronic atrial fibrillation. I35.1 - Nonrheumatic aortic             (valve) insufficiency. Q25.3 - Supravalvular aortic stenosis.             I48.91 - Unspecified atrial fibrillation.   ECHOCARDIOGRAPHIC MEASUREMENTS ----------------------------------------------- 2D DIMENSIONS AORTA          Values   Normal Range       MAIN PA      Values   Normal Range      Annulus:  nm*  cm  [2.3 - 2.9]           PA Main:  nm*  cm  [1.5 - 2.1]    Aorta Sin:   3.8 cm  [3.1 - 3.7]        RIGHT VENTRICLE  ST Junction:  nm*  cm  [2.6 - 3.2]           RV Base:  nm*  cm  [<= 4.2]    Asc.Aorta:   4.0 cm  [2.6 - 3.4]            RV Mid:  nm*  cm  [<= 3.5] LEFT VENTRICLE                              RV Length:  nm*  cm  [<= 8.6]        LVIDd:   6.0 cm  [4.2 - 5.9]        INFERIOR VENA CAVA        LVIDs:   4.4 cm                        Max.IVC:  nm*  cm  [<= 2.1]           FS:  27%      [> 25%]               Min.IVC:  nm*  cm  [<= 1.7]          SWT:   1.1 cm  [0.6 - 1.0]        __________________          PWT:   1.0 cm  [0.6 - 1.0]        nm* - not measured LEFT ATRIUM      LA Diam:   5.9 cm  [3.0 - 4.0]      LA Area:  39   cm2 [< 20]    LA Volume: 151   mL  [18 - 58]  ECHOCARDIOGRAPHIC DESCRIPTIONS ----------------------------------------------- AORTIC  ROOT         Size: DILATED   Dissection: INDETERM FOR DISSECTION  AORTIC VALVE     Leaflets: Tricuspid             Morphology: MILDLY THICKENED     Mobility: Fully Mobile  LEFT VENTRICLE                                      Anterior: Normal         Size: Normal  Lateral: Normal  Contraction: REGIONALLY IMPAIRED                     Septal: HYPOCONTRACTILE   Closest EF: 50%  (Estimated)                        Apical: Normal    LV masses: No Masses                             Inferior: Normal          LVH: None                                 Posterior: Normal Dias.FxClass: Normal  MITRAL VALVE     Leaflets: Normal                  Mobility: Fully mobile   Morphology: ANNULAR CALC  LEFT ATRIUM         Size: MODERATELY ENLARGED    LA masses: No masses               Normal IAS  MAIN PA         Size: Normal  PULMONIC VALVE   Morphology: Normal     Mobility: Fully Mobile  RIGHT VENTRICLE         Size: Normal                    Free wall: Normal  Contraction: Normal                    RV masses: No Masses        TAPSE:   2.6 cm,  Normal Range [>= 1.6 cm]  TRICUSPID VALVE     Leaflets: Normal                  Mobility: Fully mobile   Morphology: Normal  RIGHT ATRIUM         Size: Normal                     RA Other: None    RA masses: No masses  PERICARDIUM        Fluid: No effusion  INFERIOR VENACAVA         Size: Normal     Normal respiratory collapse  DOPPLER ECHO and OTHER SPECIAL PROCEDURES ------------------------------------    Aortic: SEVERE AR              No AS     Mitral: No MR                  No MS    MV Inflow E Vel.= nm* cm/s  MV Annulus E'Vel.= nm* cm/s  E/E'Ratio= nm*  Tricuspid: TRIVIAL TR             No TS  Pulmonary: No PR                  No PS      Other:  INTERPRETATION ---------------------------------------------------------------   MILD LV DYSFUNCTION (See above)   NORMAL LA PRESSURES WITH NORMAL  DIASTOLIC FUNCTION   NORMAL RIGHT VENTRICULAR SYSTOLIC FUNCTION   VALVULAR REGURGITATION: SEVERE AR, TRIVIAL TR   NO VALVULAR STENOSIS   Compared with prior Echo study on 11/13/2013: LEFT VENTRICULAR  DIMENSIONS   ARE INCREASED; ASCENDING AORTA MEASURES LARGER BUT IS CONSISTENT WITH   MEASUREMENT FROM 03/17/2012 STUDY.   (Report version 3.0)                    Interpreted and Electronically signed  Perform. by: Percell LocusAshlee Davis, RDCS                  by: Earlie LouSvati Hasmukh Shah, MD  Resp.Person: Percell Locusshlee Davis, RDCS                  On: 09/18/2014 17:18:19  Recent Lab Findings: Lab Results  Component Value Date   WBC 5.7 10/17/2014   HGB 11.1* 10/17/2014   HCT 33.2* 10/17/2014   PLT 175 10/17/2014   GLUCOSE 99 10/18/2014   ALT 18 06/20/2014   AST 21 06/20/2014   NA 141 10/18/2014   K 3.4* 10/18/2014   CL 106 10/18/2014   CREATININE 1.61* 10/18/2014   BUN 10 10/18/2014   CO2 30 10/18/2014   TSH 3.763 10/15/2014   INR 1.17 06/20/2014   Chronic Kidney Disease   Stage I     GFR >90  Stage II    GFR 60-89  Stage IIIA GFR 45-59  Stage IIIB GFR 30-44  Stage IV   GFR 15-29  Stage V    GFR  <15   Assessment / Plan:   1/ Acute on chronic diastolic Heart failure, with preserved EF likely from symptomatic AI Patient appears euvolemic on exam today On  lasix  And linopril  2/confusion/presyncope  3/Afib: rate controled and on eloquis - EKG, tele On  eloquis   4/CKD: patient baseline seems to be around 1.6, currently at baseline   5/depression/anxiety/insomnia  on lexapro, lamictal, lunesta   6/ HTN , HL  Treated  7/ hypothyroidism: TSH is  3.7      on synthroid     8/ stable pulmonary nodules per report from [facility]  I have seen the patient and reviewed his chart including cardiac cath and echo films from [facility]. With the patient's age and very frail condition he would be a very poor candidate for any major surgical intervention at this point. I've discussed this with the patient and  his son in detail. He continues to be staying at a rehabilitation facility because of his difficulty in getting around and fragility. I'll plan to see him back in 3 weeks to reassess.  I  spent 40 minutes counseling the patient face to face and 50% or more the  time was spent in counseling and coordination of care. The total time spent in the appointment was 60 minutes.  Delight OvensEdward B Sirron Francesconi MD      [address] [place] [id number] Office [phone]   Beeper [phone]  11/08/2014 6:13 PM

## 2014-11-11 ENCOUNTER — Encounter: Payer: Self-pay | Admitting: Internal Medicine

## 2014-11-11 ENCOUNTER — Non-Acute Institutional Stay (SKILLED_NURSING_FACILITY): Payer: Medicare Other | Admitting: Internal Medicine

## 2014-11-11 DIAGNOSIS — N183 Chronic kidney disease, stage 3 (moderate): Secondary | ICD-10-CM | POA: Diagnosis not present

## 2014-11-11 DIAGNOSIS — I482 Chronic atrial fibrillation, unspecified: Secondary | ICD-10-CM

## 2014-11-11 DIAGNOSIS — I25119 Atherosclerotic heart disease of native coronary artery with unspecified angina pectoris: Secondary | ICD-10-CM | POA: Diagnosis not present

## 2014-11-11 DIAGNOSIS — R55 Syncope and collapse: Secondary | ICD-10-CM

## 2014-11-11 DIAGNOSIS — R9439 Abnormal result of other cardiovascular function study: Secondary | ICD-10-CM

## 2014-11-11 DIAGNOSIS — G4733 Obstructive sleep apnea (adult) (pediatric): Secondary | ICD-10-CM | POA: Diagnosis not present

## 2014-11-11 DIAGNOSIS — E7800 Pure hypercholesterolemia, unspecified: Secondary | ICD-10-CM

## 2014-11-11 DIAGNOSIS — I10 Essential (primary) hypertension: Secondary | ICD-10-CM

## 2014-11-11 DIAGNOSIS — I350 Nonrheumatic aortic (valve) stenosis: Secondary | ICD-10-CM | POA: Diagnosis not present

## 2014-11-11 DIAGNOSIS — E034 Atrophy of thyroid (acquired): Secondary | ICD-10-CM

## 2014-11-11 DIAGNOSIS — E038 Other specified hypothyroidism: Secondary | ICD-10-CM | POA: Diagnosis not present

## 2014-11-11 DIAGNOSIS — S82401D Unspecified fracture of shaft of right fibula, subsequent encounter for closed fracture with routine healing: Secondary | ICD-10-CM

## 2014-11-11 DIAGNOSIS — R269 Unspecified abnormalities of gait and mobility: Secondary | ICD-10-CM | POA: Diagnosis not present

## 2014-11-11 DIAGNOSIS — Z7901 Long term (current) use of anticoagulants: Secondary | ICD-10-CM | POA: Diagnosis not present

## 2014-11-11 DIAGNOSIS — R296 Repeated falls: Secondary | ICD-10-CM | POA: Diagnosis not present

## 2014-11-11 DIAGNOSIS — F39 Unspecified mood [affective] disorder: Secondary | ICD-10-CM | POA: Diagnosis not present

## 2014-11-11 DIAGNOSIS — I4891 Unspecified atrial fibrillation: Secondary | ICD-10-CM | POA: Diagnosis not present

## 2014-11-11 NOTE — Progress Notes (Signed)
MRN: [id number] Name: [name] [name]  Sex: male Age: 81 y.o. DOB: 07-28-1932  PSC #: [name] Facility/Room: 107A Level Of Care: SNF Provider: Merrilee Seashore D Emergency Contacts: Extended Emergency Contact Information Primary Emergency Contact: DakotaRobb  [place] Mobile Phone: [phone] Relation: Son Secondary Emergency Contact: DakotaKrause Address: [address]          [name] [name] [place] Home Phone: [phone] Relation: Spouse  Code Status:FULL   Allergies: Review of patient's allergies indicates no known allergies.  Chief Complaint  Patient presents with  . Discharge Note    HPI: Patient is 81 y.o. male who  Past Medical History  Diagnosis Date  . Atrial fibrillation   . Hypothyroidism   . Plantar fasciitis   . Seasonal allergies   . Decreased testosterone level   . Varicose veins   . Hypertension   . Hypercholesteremia   . Aortic valve insufficiency   . Depression   . Heart murmur   . DVT (deep venous thrombosis) ~ 2013    "BLE"  . OSA (obstructive sleep apnea)     "suppose to wear mask; I throw it off in my sleep" (10/15/2014)  . COPD (chronic obstructive pulmonary disease)   . Emphysema of lung   . History of blood transfusion 1960    "lots; related to an accident"  . Arthritis     "wee bit; knees, elbows" (10/15/2014)  . Anxiety   . Chronic kidney disease     "down to ~ 1/2 of their regular use; I see kidney dr. in [place]" (10/15/2014)  . Falls frequently     > 20 times in the last year/notes 10/15/2014  . Syncope and collapse "several times"    Past Surgical History  Procedure Laterality Date  . Abdominal aortic aneurysm repair  01/2006; 03/10/2006    Hattie Perch 01/12/2011  . Tonsillectomy    . Hernia repair    . Laparoscopic incisional / umbilical / ventral hernia repair  02/2010    VHR w/mesh/notes 03/28/2010  . Laparoscopic cholecystectomy  08/2009    w/IOC/notes 09/18/2009  . Ercp   11/2009    Hattie Perch 12/05/2009  . Cardiac catheterization  1990's X 1; 09/2014  . Brain surgery  1960 X 4    "S/P got my skull busted"      Medication List       This list is accurate as of: 11/11/14  4:35 PM.  Always use your most recent med list.               acetaminophen 325 MG tablet  Commonly known as:  TYLENOL  Take 2 tablets (650 mg total) by mouth every 6 (six) hours as needed for mild pain (or Fever >/= 101).     albuterol 108 (90 BASE) MCG/ACT inhaler  Commonly known as:  PROVENTIL HFA;VENTOLIN HFA  Inhale 2 puffs into the lungs every 6 (six) hours as needed for wheezing or shortness of breath.     amLODipine 5 MG tablet  Commonly known as:  NORVASC  Take 5 mg by mouth daily.     apixaban 2.5 MG Tabs tablet  Commonly known as:  ELIQUIS  Take 1 tablet (2.5 mg total) by mouth 2 (two) times daily.     aspirin 81 MG tablet  Take 81 mg by mouth daily.     atorvastatin 10 MG tablet  Commonly known as:  LIPITOR  Take 10 mg by mouth daily at 6 PM.  b complex vitamins tablet  Take 1 tablet by mouth daily.     buPROPion 300 MG 24 hr tablet  Commonly known as:  WELLBUTRIN XL  Take 300 mg by mouth daily.     busPIRone 15 MG tablet  Commonly known as:  BUSPAR  Take 15 mg by mouth 2 (two) times daily.     calcium carbonate 500 MG chewable tablet  Commonly known as:  TUMS - dosed in mg elemental calcium  Chew 1 tablet by mouth as needed for indigestion or heartburn.     cholecalciferol 1000 UNITS tablet  Commonly known as:  VITAMIN D  Take 2,000 Units by mouth daily.     clonazePAM 0.5 MG tablet  Commonly known as:  KLONOPIN  Take 1 tablet (0.5 mg total) by mouth at bedtime as needed for anxiety.     digoxin 0.125 MG tablet  Commonly known as:  LANOXIN  Take 0.125 mg by mouth daily.     diltiazem 120 MG 24 hr capsule  Commonly known as:  TIAZAC  Take 120 mg by mouth daily.     docusate sodium 100 MG capsule  Commonly known as:  COLACE  Take 1 capsule  (100 mg total) by mouth daily as needed for mild constipation.     dutasteride 0.5 MG capsule  Commonly known as:  AVODART  Take 0.5 mg by mouth daily.     escitalopram 20 MG tablet  Commonly known as:  LEXAPRO  Take 20 mg by mouth daily.     eszopiclone 3 MG Tabs  Generic drug:  Eszopiclone  Take 3 mg by mouth at bedtime. Take immediately before bedtime     fluticasone 50 MCG/ACT nasal spray  Commonly known as:  FLONASE  Place 1 spray into both nostrils daily.     folic acid 1 MG tablet  Commonly known as:  FOLVITE  Take 1 mg by mouth daily.     lamoTRIgine 100 MG tablet  Commonly known as:  LAMICTAL  Take 100 mg by mouth 2 (two) times daily. Breakfast and dinner     levothyroxine 150 MCG tablet  Commonly known as:  SYNTHROID, LEVOTHROID  Take 150 mcg by mouth daily before breakfast.     Melatonin 3 MG Tabs  Take 1 tablet by mouth at bedtime.     omega-3 acid ethyl esters 1 G capsule  Commonly known as:  LOVAZA  Take 2 g by mouth 2 (two) times daily.     oxyCODONE 5 MG immediate release tablet  Commonly known as:  Oxy IR/ROXICODONE  Take 1 tablet (5 mg total) by mouth every 6 (six) hours as needed for severe pain.        Meds ordered this encounter  Medications  . amLODipine (NORVASC) 5 MG tablet    Sig: Take 5 mg by mouth daily.    Immunization History  Administered Date(s) Administered  . Influenza, High Dose Seasonal PF 05/29/2013  . Influenza-Unspecified 04/30/2014  . Pneumococcal Polysaccharide-23 04/29/2010    History  Substance Use Topics  . Smoking status: Former Smoker -- 2.00 packs/day for 22 years    Types: Cigarettes    Quit date: 04/29/1974  . Smokeless tobacco: Never Used  . Alcohol Use: No    Filed Vitals:   11/11/14 1618  BP: 121/78  Pulse: 82  Temp: 97.1 F (36.2 C)  Resp: 20    Physical Exam  GENERAL APPEARANCE: Alert, conversant. No acute distress.  HEENT: Unremarkable. RESPIRATORY: Breathing  is even, unlabored. Lung  sounds are clear   CARDIOVASCULAR: Heart RRR no murmurs, rubs or gallops. No peripheral edema.  GASTROINTESTINAL: Abdomen is soft, non-tender, not distended w/ normal bowel sounds.  NEUROLOGIC: Cranial nerves 2-12 grossly intact. Moves all extremities  Patient Active Problem List   Diagnosis Date Noted  . Abnormal carotid duplex scan 10/28/2014  . OSA (obstructive sleep apnea) 10/28/2014  . Mood disorder with psychosis 10/28/2014  . Episodes of formed visual hallucinations 10/16/2014  . Faintness   . Syncope 10/15/2014  . Acute on chronic renal failure 10/15/2014  . Right fibular fracture 10/15/2014  . Hypoxia 10/15/2014  . Syncope and collapse 10/15/2014  . CAD (coronary artery disease), native coronary artery 09/29/2014  . Falls frequently 09/29/2014  . Hypertension   . Hypercholesteremia   . Aortic valve insufficiency   . Depression   . Atrial fibrillation, chronic 06/20/2014  . Hypothyroidism 06/20/2014  . DOE (dyspnea on exertion) 06/20/2014  . Cough 04/29/2014  . Shortness of breath 04/29/2014    CBC    Component Value Date/Time   WBC 5.7 10/17/2014 0500   RBC 3.52* 10/17/2014 0500   HGB 11.1* 10/17/2014 0500   HCT 33.2* 10/17/2014 0500   PLT 175 10/17/2014 0500   MCV 94.3 10/17/2014 0500   LYMPHSABS 1.5 06/20/2014 1142   MONOABS 0.7 06/20/2014 1142   EOSABS 0.1 06/20/2014 1142   BASOSABS 0.0 06/20/2014 1142    CMP     Component Value Date/Time   NA 141 10/18/2014 0535   K 3.4* 10/18/2014 0535   CL 106 10/18/2014 0535   CO2 30 10/18/2014 0535   GLUCOSE 99 10/18/2014 0535   BUN 10 10/18/2014 0535   CREATININE 1.61* 10/18/2014 0535   CALCIUM 8.5 10/18/2014 0535   PROT 7.4 06/20/2014 1142   ALBUMIN 3.4* 06/20/2014 1142   AST 21 06/20/2014 1142   ALT 18 06/20/2014 1142   ALKPHOS 91 06/20/2014 1142   BILITOT 0.5 06/20/2014 1142   GFRNONAA 38* 10/18/2014 0535   GFRAA 45* 10/18/2014 0535    Assessment and Plan  Pt is d/c to home with  HH/OT/PT.  Margit HanksALEXANDER, ANNE D, MD

## 2014-11-12 ENCOUNTER — Telehealth: Payer: Self-pay | Admitting: Cardiovascular Disease

## 2014-11-12 NOTE — Telephone Encounter (Signed)
Dr. Debroah BallerFulp's office is closed. Pt is scheduled to see Dr. [name] for new pt appt on 11/25/14

## 2014-11-12 NOTE — Telephone Encounter (Signed)
New message      Dr [name]'s office ask that the nurse call the son, Willette ClusterRobert Lacina [phone], to answer questions about cardiac rehab.  They were asking Dr Virgilio FreesFullp questions that she could not answer.  I told Dr Debroah BallerFulp's office that we normally do not answer questions when we have not seen the pt before.  She wanted the msg to go to the nurse anyway.

## 2014-11-13 NOTE — Telephone Encounter (Signed)
Called Dr. [name]'s office and spoke w/[name] [name] who states the son has questions regarding cardiac rehab and would like for us to call him.  Called [name] at [phone].  He states there must have been some miscommunication.  He wanted to know about ordering Cardiac PT but understands that it will be addressed at office appointment.  Advised that we could not place orders since he has never been seen by Dr. [name].  He verbalizes understanding and will discuss with Dr. [name] at visit.

## 2014-11-14 DIAGNOSIS — I10 Essential (primary) hypertension: Secondary | ICD-10-CM | POA: Diagnosis not present

## 2014-11-14 DIAGNOSIS — I2510 Atherosclerotic heart disease of native coronary artery without angina pectoris: Secondary | ICD-10-CM | POA: Diagnosis not present

## 2014-11-14 DIAGNOSIS — I4891 Unspecified atrial fibrillation: Secondary | ICD-10-CM | POA: Diagnosis not present

## 2014-11-14 DIAGNOSIS — R269 Unspecified abnormalities of gait and mobility: Secondary | ICD-10-CM | POA: Diagnosis not present

## 2014-11-14 DIAGNOSIS — M6281 Muscle weakness (generalized): Secondary | ICD-10-CM | POA: Diagnosis not present

## 2014-11-14 DIAGNOSIS — I340 Nonrheumatic mitral (valve) insufficiency: Secondary | ICD-10-CM | POA: Diagnosis not present

## 2014-11-21 ENCOUNTER — Encounter: Payer: Self-pay | Admitting: *Deleted

## 2014-11-25 ENCOUNTER — Encounter: Payer: Self-pay | Admitting: Cardiovascular Disease

## 2014-11-25 ENCOUNTER — Ambulatory Visit (INDEPENDENT_AMBULATORY_CARE_PROVIDER_SITE_OTHER): Payer: Medicare Other | Admitting: Cardiovascular Disease

## 2014-11-25 VITALS — BP 100/48 | HR 72 | Ht 72.0 in | Wt 244.1 lb

## 2014-11-25 DIAGNOSIS — I25119 Atherosclerotic heart disease of native coronary artery with unspecified angina pectoris: Secondary | ICD-10-CM | POA: Diagnosis not present

## 2014-11-25 DIAGNOSIS — I10 Essential (primary) hypertension: Secondary | ICD-10-CM

## 2014-11-25 DIAGNOSIS — I482 Chronic atrial fibrillation, unspecified: Secondary | ICD-10-CM

## 2014-11-25 DIAGNOSIS — I351 Nonrheumatic aortic (valve) insufficiency: Secondary | ICD-10-CM | POA: Diagnosis not present

## 2014-11-25 NOTE — Patient Instructions (Signed)
Your physician recommends that you continue on your current medications as directed. Please refer to the Current Medication list given to you today.  Your physician recommends that you schedule a follow-up appointment in: 3 months with Dr. Nahser.   

## 2014-11-25 NOTE — Progress Notes (Addendum)
Cardiology Office Note   Date:  11/25/2014   ID:  [name] [name], DOB 06/02/1932, MRN [id number]  PCP:  [name] [name], CAMMIE, MD  Cardiologist:   [name] [name], [name] J, MD   Chief Complaint  Patient presents with  . Cardiac Valve Problem    s/p aortic valve replacement.    Problem List: 1. Aortic insufficiency 2. Hyperlipidemia 3. HTN 4. Obstructive sleep apnea 5. AAA - 2007 , [name] [name] 6. Hypothyroidism 7. COPD  8. Atrial fib:      History of Present Illness: [name] [name] is a 81 y.o. male who presents for evaluation for possible aortic valve replacement . He has been followed by the cardiologist at [name] for years.   He wants to have his AVR here. Has chronic shortness of breath.  Gets worn out taking a load of laundry up the stairs.  Has had recent echos at [name].  (have been incorporated in our system )   His overall condition has declined over the past 6 months.  Has some balance issues.  Has orthostatic hypotension.   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   Can walk 1/4 mile  Can climb 2 flights of stairs.  No PND or orthopnea.   No syncope.     Past Medical History  Diagnosis Date  . Atrial fibrillation   . Hypothyroidism   . Plantar fasciitis   . Seasonal allergies   . Decreased testosterone level   . Varicose veins   . Hypertension   . Hypercholesteremia   . Aortic valve insufficiency   . Depression   . Heart murmur   . DVT (deep venous thrombosis) ~ 2013    "BLE"  . OSA (obstructive sleep apnea)     "suppose to wear mask; I throw it off in my sleep" (10/15/2014)  . COPD (chronic obstructive pulmonary disease)   . Emphysema of lung   . History of blood transfusion 1960    "lots; related to an accident"  . Arthritis     "wee bit; knees, elbows" (10/15/2014)  . Anxiety   . Chronic kidney disease     "down to ~ 1/2 of their regular use; I  [place] dr. in [place]" (10/15/2014)  . Falls frequently     > 20 times in the last year/notes 10/15/2014  . Syncope and collapse "several times"    Past Surgical History  Procedure Laterality Date  . Abdominal aortic aneurysm repair  01/2006; 03/10/2006    [name] [name]/notes 01/12/2011  . Tonsillectomy    . Hernia repair    . Laparoscopic incisional / umbilical / ventral hernia repair  02/2010    VHR w/mesh/notes 03/28/2010  . Laparoscopic cholecystectomy  08/2009    w/IOC/notes 09/18/2009  . Ercp  11/2009    [name] [name]/notes 12/05/2009  . Cardiac catheterization  1990's X 1; 09/2014  . Brain surgery  1960 X 4    "S/P got my skull busted"     Current Outpatient Prescriptions  Medication Sig Dispense Refill  . acetaminophen (TYLENOL) 325 MG tablet Take 2 tablets (650 mg total) by mouth every 6 (six) hours as needed for mild pain (or Fever >/= 101).    Marland Kitchen. albuterol (PROVENTIL HFA;VENTOLIN HFA) 108 (90 BASE) MCG/ACT inhaler Inhale 2 puffs into the lungs every 6 (six) hours as needed for wheezing or shortness of breath.    .Marland Kitchen  amLODipine (NORVASC) 5 MG tablet Take 5 mg by mouth daily.    Marland Kitchen apixaban (ELIQUIS) 2.5 MG TABS tablet Take 1 tablet (2.5 mg total) by mouth 2 (two) times daily. 60 tablet 3  . atorvastatin (LIPITOR) 10 MG tablet Take 10 mg by mouth daily at 6 PM.    . buPROPion (WELLBUTRIN XL) 300 MG 24 hr tablet Take 300 mg by mouth daily.    . busPIRone (BUSPAR) 15 MG tablet Take 15 mg by mouth 2 (two) times daily.    . calcium carbonate (TUMS - DOSED IN MG ELEMENTAL CALCIUM) 500 MG chewable tablet Chew 1 tablet by mouth as needed for indigestion or heartburn.    . clonazePAM (KLONOPIN) 0.5 MG tablet Take 1 tablet (0.5 mg total) by mouth at bedtime as needed for anxiety. 30 tablet 0  . digoxin (LANOXIN) 0.125 MG tablet Take 0.125 mg by mouth daily.    Marland Kitchen diltiazem (TIAZAC) 120 MG 24 hr capsule Take 120 mg by mouth daily.    Marland Kitchen dutasteride (AVODART) 0.5 MG capsule Take 0.5 mg by mouth daily.    Marland Kitchen  escitalopram (LEXAPRO) 20 MG tablet Take 20 mg by mouth daily.    . Eszopiclone (ESZOPICLONE) 3 MG TABS Take 3 mg by mouth at bedtime. Take immediately before bedtime    . fluticasone (FLONASE) 50 MCG/ACT nasal spray Place 1 spray into both nostrils daily. 9.9 g 2  . folic acid (FOLVITE) 1 MG tablet Take 1 mg by mouth daily.    Marland Kitchen lamoTRIgine (LAMICTAL) 100 MG tablet Take 100 mg by mouth 2 (two) times daily. Breakfast and dinner    . levothyroxine (SYNTHROID, LEVOTHROID) 150 MCG tablet Take 150 mcg by mouth daily before breakfast.    . Melatonin 3 MG TABS Take 1 tablet by mouth at bedtime.     No current facility-administered medications for this visit.    Allergies:   Review of patient's allergies indicates no known allergies.    Social History:  The patient  reports that he quit smoking about 40 years ago. His smoking use included Cigarettes. He has a 44 pack-year smoking history. He has never used smokeless tobacco. He reports that he does not drink alcohol or use illicit drugs.   Family History:  The patient's family history includes Cancer in his sister; Heart disease in his father; Rheum arthritis in his father and mother.    ROS:  Please see the history of present illness.    Review of Systems: Constitutional:  denies fever, chills, diaphoresis, appetite change and fatigue.  HEENT: denies photophobia, eye pain, redness, hearing loss, ear pain, congestion, sore throat, rhinorrhea, sneezing, neck pain, neck stiffness and tinnitus.  Respiratory: denies SOB, DOE, cough, chest tightness, and wheezing.  Cardiovascular: denies chest pain, palpitations and leg swelling.  Gastrointestinal: denies nausea, vomiting, abdominal pain, diarrhea, constipation, blood in stool.  Genitourinary: denies dysuria, urgency, frequency, hematuria, flank pain and difficulty urinating.  Musculoskeletal: denies  myalgias, back pain, joint swelling, arthralgias and gait problem.   Skin: denies pallor, rash and  wound.  Neurological: denies dizziness, seizures, syncope, weakness, light-headedness, numbness and headaches.   Hematological: denies adenopathy, easy bruising, personal or family bleeding history.  Psychiatric/ Behavioral: denies suicidal ideation, mood changes, confusion, nervousness, sleep disturbance and agitation.       All other systems are reviewed and negative.    PHYSICAL EXAM: VS:  BP 100/48 mmHg  Pulse 72  Ht 6' (1.829 m)  Wt 244 lb 1.9 oz (110.732  kg)  BMI 33.10 kg/m2  SpO2 93% , BMI Body mass index is 33.1 kg/(m^2). GEN: Well nourished, well developed, in no acute distress HEENT: normal Neck: no JVD, carotid bruits, or masses Cardiac: Irreg. Irreg. ; no murmurs, rubs, or gallops,no edema  Respiratory:  clear to auscultation bilaterally, normal work of breathing GI: soft, nontender, nondistended, + BS MS: no deformity or atrophy Skin: warm and dry, no rash Neuro:  Strength and sensation are intact Psych: normal   EKG:  EKG is not ordered today. The ekg ordered Feb. 17, 2016  demonstrates atrial fib with controlled rate.    Recent Labs: 06/20/2014: ALT 18; Pro B Natriuretic peptide (BNP) 930.1* 10/15/2014: B Natriuretic Peptide 164.8*; TSH 3.763 10/17/2014: Hemoglobin 11.1*; Platelets 175 10/18/2014: BUN 10; Creatinine 1.61*; Potassium 3.4*; Sodium 141    Lipid Panel No results found for: CHOL, TRIG, HDL, CHOLHDL, VLDL, LDLCALC, LDLDIRECT    Wt Readings from Last 3 Encounters:  11/25/14 244 lb 1.9 oz (110.732 kg)  11/08/14 236 lb (107.049 kg)  10/18/14 257 lb 4.4 oz (116.7 kg)      Other studies Reviewed: Additional studies/ records that were reviewed today include: . Review of the above records demonstrates:    ASSESSMENT AND PLAN:  1. Aortic insufficiency  His atrial fib has worsened from Sept. 2015 - Jan. 2016.   He has DOE that has worsened over the past couple months. He's been in a skilled nursing facility and has made some progress. His  endurance is much better.  He has an appointment to see Dr. Tyrone Sage next week. I think that we can anticipate proceeding with his valve surgery.  He had a cardiac catheterization in January which revealed moderate coronary artery disease and a moderate to severe stenosis in the distal right coronary artery. This should  be bypassed at the same time..  2. Hyperlipidemia  3. HTN - BP is well controlled.   4. Obstructive sleep apnea  5. AAA - 2007 , Cari Caraway  6. Hypothyroidism  7. COPD   8. Atrial fib:  -  He is in chronic atrial fibrillation. He's currently on Eliquis. His rate is well-controlled. Continue digoxin and diltiazem for now.   Current medicines are reviewed at length with the patient today.  The patient does not have concerns regarding medicines.  The following changes have been made:  no change  Labs/ tests ordered today include:  No orders of the defined types were placed in this encounter.     Disposition:   FU with me in 3 months.     Signed, [name], Deloris Ping, MD  11/25/2014 10:55 AM    [facility] [address] [place], [place], [place]  [address] Phone: [phone]; Fax: [phone]

## 2014-11-28 ENCOUNTER — Ambulatory Visit (INDEPENDENT_AMBULATORY_CARE_PROVIDER_SITE_OTHER): Payer: Medicare Other | Admitting: Cardiothoracic Surgery

## 2014-11-28 ENCOUNTER — Encounter: Payer: Self-pay | Admitting: Cardiothoracic Surgery

## 2014-11-28 VITALS — BP 110/57 | HR 96 | Resp 20 | Ht 72.0 in | Wt 244.0 lb

## 2014-11-28 DIAGNOSIS — I4891 Unspecified atrial fibrillation: Secondary | ICD-10-CM | POA: Diagnosis not present

## 2014-11-28 DIAGNOSIS — I351 Nonrheumatic aortic (valve) insufficiency: Secondary | ICD-10-CM | POA: Diagnosis not present

## 2014-11-28 DIAGNOSIS — I25119 Atherosclerotic heart disease of native coronary artery with unspecified angina pectoris: Secondary | ICD-10-CM | POA: Diagnosis not present

## 2014-11-28 NOTE — Progress Notes (Signed)
[address]       [place] [address]             [phone]                    [name] Medical Record #[id number] Date of Birth: 1932-01-08  Referring: [name] [name],* Primary Care: [name] Saupe, MD  Chief Complaint:    Chief Complaint  Patient presents with  . Coronary Artery Disease    3 weeek f/u to further discuss surgery  . Aortic Insuffiency  . Atrial Fibrillation    History of Present Illness:    [name] [name] 81 y.o. male is seen in the office  concerning aortic valve replacement and coronary artery bypass grafting.He has been followed at [facility] for many years with  aortic valve insufficiency atrial fibrillation and hypertension. The patient was  hospitalized at [facility] in January with increasing dyspnea on exertion and difficulty carrying objects and caring for his usual activities. He  underwent further evaluation for consideration of surgery including cardiac cath CT scan of the chest and echocardiogram. At that time it was recommended to him that he needed an aortic valve replacement and coronary artery bypass but was not felt to be strong enough and was discharge to a nursing home. He saw me 4 weeks ago for  opinion concerning   cardiac surgery . At that time he was still very weak and not independent .  He is now living at home and his functional status has improved, comes in the office today without a walker and ambulating adequately.   Current Activity/ Functional Status:  Patient is  independent with mobility/ambulation, transfers, ADL's, IADL's. . His son notes that he has been walking has markedly improved.  He denies chest pain   Note from [facility]. [name] [name] presented for pre-hydration prior to RHC/LHC. TTE on 09/18/2014 showed an LVEF 50%, LVIDs 4.4 cm, severe AR. RHC/LHC on 09/20/2014 showed a RA 11 RV 50/13 PA 47/25 (33) PCWP 18 CI 1.6 PVR 4.0 and a 70% distal RCA lesion. After discussion among the heart team,  given his frailty, the decision was made to send him to a SNF given his frailty and have him re-evaluated in clinic by [name] [name] for CABG+AVR. He would likely be a poor TAVR candidate given the absence of Ca in his AoV annulus (CT Chest obtained while in house).   [name] [name]: At the time of surgery this patient's most appropriate activity status/level should be described as: []     0    Normal activity, no symptoms []     1    Restricted in physical strenuous activity but ambulatory, able to do out light work [x]     2    Ambulatory and capable of self care, unable to do work activities, up and about               >50 % of waking hours                              []     3    Only limited self care, in bed greater than 50% of waking hours []     4    Completely disabled, no self care, confined to bed or chair []     5    Moribund   Past Medical History  Diagnosis Date  . Atrial  fibrillation   . Hypothyroidism   . Plantar fasciitis   . Seasonal allergies   . Decreased testosterone level   . Varicose veins   . Hypertension   . Hypercholesteremia   . Aortic valve insufficiency   . Depression   . Heart murmur   . DVT (deep venous thrombosis) ~ 2013    "BLE"  . OSA (obstructive sleep apnea)     "suppose to wear mask; I throw it off in my sleep" (10/15/2014)  . COPD (chronic obstructive pulmonary disease)   . Emphysema of lung   . History of blood transfusion 1960    "lots; related to an accident"  . Arthritis     "wee bit; knees, elbows" (10/15/2014)  . Anxiety   . Chronic kidney disease     "down to ~ 1/2 of their regular use; I see kidney dr. in [place]" (10/15/2014)  . Falls frequently     > 20 times in the last year/notes 10/15/2014  . Syncope and collapse "several times"    Past Surgical History  Procedure Laterality Date  . Abdominal aortic aneurysm repair  01/2006; 03/10/2006    [name] [name] 01/12/2011  . Tonsillectomy    . Hernia repair    . Laparoscopic incisional / umbilical /  ventral hernia repair  02/2010    VHR w/mesh/notes 03/28/2010  . Laparoscopic cholecystectomy  08/2009    w/IOC/notes 09/18/2009  . Ercp  11/2009    [name] [name] 12/05/2009  . Cardiac catheterization  1990's X 1; 09/2014  . Brain surgery  1960 X 4    "S/P got my skull busted"  previous head injury 1960 to hospitalize 93 days  Family History  Problem Relation Age of Onset  . Heart disease Father   . Rheum arthritis Mother   . Rheum arthritis Father   . Cancer Sister     History   Social History  . Marital Status: Married    Spouse Name: N/A  . Number of Children: N/A  . Years of Education: N/A   Occupational History  . Not on file.   Social History Main Topics  . Smoking status: Former Smoker -- 2.00 packs/day for 22 years    Types: Cigarettes    Quit date: 04/29/1974  . Smokeless tobacco: Never Used  . Alcohol Use: No  . Drug Use: No  . Sexual Activity: Not Currently   Other Topics Concern  . Not on file   Social History Narrative    History  Smoking status  . Former Smoker -- 2.00 packs/day for 22 years  . Types: Cigarettes  . Quit date: 04/29/1974  Smokeless tobacco  . Never Used    History  Alcohol Use No     No Known Allergies  Current Outpatient Prescriptions  Medication Sig Dispense Refill  . albuterol (PROVENTIL HFA;VENTOLIN HFA) 108 (90 BASE) MCG/ACT inhaler Inhale 2 puffs into the lungs every 6 (six) hours as needed for wheezing or shortness of breath.    Marland Kitchen amLODipine (NORVASC) 5 MG tablet Take 5 mg by mouth daily.    Marland Kitchen apixaban (ELIQUIS) 2.5 MG TABS tablet Take 1 tablet (2.5 mg total) by mouth 2 (two) times daily. 60 tablet 3  . atorvastatin (LIPITOR) 10 MG tablet Take 10 mg by mouth daily at 6 PM.    . buPROPion (WELLBUTRIN XL) 300 MG 24 hr tablet Take 300 mg by mouth daily.    . busPIRone (BUSPAR) 15 MG tablet Take 15 mg by mouth 2 (two) times  daily.    . calcium carbonate (TUMS - DOSED IN MG ELEMENTAL CALCIUM) 500 MG chewable tablet Chew 1 tablet by  mouth as needed for indigestion or heartburn.    . clonazePAM (KLONOPIN) 0.5 MG tablet Take 1 tablet (0.5 mg total) by mouth at bedtime as needed for anxiety. 30 tablet 0  . digoxin (LANOXIN) 0.125 MG tablet Take 0.125 mg by mouth daily.    Marland Kitchen diltiazem (TIAZAC) 120 MG 24 hr capsule Take 120 mg by mouth daily.    Marland Kitchen dutasteride (AVODART) 0.5 MG capsule Take 0.5 mg by mouth daily.    Marland Kitchen escitalopram (LEXAPRO) 20 MG tablet Take 20 mg by mouth daily.    . Eszopiclone (ESZOPICLONE) 3 MG TABS Take 3 mg by mouth at bedtime. Take immediately before bedtime    . fluticasone (FLONASE) 50 MCG/ACT nasal spray Place 1 spray into both nostrils daily. 9.9 g 2  . folic acid (FOLVITE) 1 MG tablet Take 1 mg by mouth daily.    Marland Kitchen lamoTRIgine (LAMICTAL) 100 MG tablet Take 100 mg by mouth 2 (two) times daily. Breakfast and dinner    . levothyroxine (SYNTHROID, LEVOTHROID) 150 MCG tablet Take 150 mcg by mouth daily before breakfast.    . Melatonin 3 MG TABS Take 1 tablet by mouth at bedtime.    Marland Kitchen acetaminophen (TYLENOL) 325 MG tablet Take 2 tablets (650 mg total) by mouth every 6 (six) hours as needed for mild pain (or Fever >/= 101). (Patient not taking: Reported on 11/28/2014)     No current facility-administered medications for this visit.      Review of Systems:     Cardiac Review of Systems: Y or N  Chest Pain [ n   ]  Resting SOB [ y  ] Exertional SOB  [ y ]  Orthopnea [place]ienne  ]   Pedal Edema [ y  ]    Palpitations [ y ] Syncope  [ n ]   Presyncope [ y  ]  General Review of Systems: [Y] = yes [  ]=no Constitional: recent weight change [ y ];  Wt loss over the last 3 months [   ] anorexia [  ]; fatigue [  ]; nausea [  ]; night sweats [  ]; fever [  ]; or chills [  ];          Dental: poor dentition[  ]; Last Dentist visit:   Eye : blurred vision [  ]; diplopia [   ]; vision changes [  ];  Amaurosis fugax[  ]; Resp: cough [ y ];  wheezingy[  ];  hemoptysis[n  ]; shortness of breath[ y ]; paroxysmal nocturnal  dyspnea[ y ]; dyspnea on exertion[  y]; or orthopnea[ y ];  GI:  gallstones[  ], vomiting[  ];  dysphagia[  ]; melena[  ];  hematochezia [  ]; heartburn[  ];   Hx of  Colonoscopy[y  ]; GU: kidney stones [  ]; hematuria[  ];   dysuria [  ];  nocturia[  ];  history of     obstruction [  ]; urinary frequency [  ]             Skin: rash, swelling[  ];, hair loss[  ];  peripheral edema[  ];  or itching[  ]; Musculosketetal: myalgias[  ];  joint swelling[  ];  joint erythema[  ];  joint pain[  ];  back pain[  ];  Heme/Lymph: bruising[ y ];  bleeding[ n ];  anemia[ n ];  Neuro: TIA[  ];  headaches[  ];  stroke[n  ];  vertigo[ y ];  seizures[n  ];   paresthesias[  ];  difficulty walking[y  ];  Psych:depression[y  ]; anxiety[y  ];  Endocrine: diabetes[n  ];  thyroid dysfunction[ y ];  Immunizations: Flu up to date [  ]; Pneumococcal up to date [  ];  Other:  Physical Exam: BP 110/57 mmHg  Pulse 96  Resp 20  Ht 6' (1.829 m)  Wt 244 lb (110.678 kg)  BMI 33.09 kg/m2  SpO2 96%  PHYSICAL EXAMINATION: General appearance: alert, cooperative and slowed mentation Head: Normocephalic, without obvious abnormality, atraumatic Neck: no adenopathy, no carotid bruit, no JVD, supple, symmetrical, trachea midline, thyroid not enlarged, symmetric, no tenderness/mass/nodules and scar from left carotid surgery Lymph nodes: Cervical, supraclavicular, and axillary nodes normal. Resp: clear to auscultation bilaterally Back: symmetric, no curvature. ROM normal. No CVA tenderness. Cardio: irregularly irregular rhythm GI: soft, non-tender; bowel sounds normal; no masses,  no organomegaly Extremities: edema mild bilaterial edema Neurologic: Mental status: Alert, oriented, thought content appropriate, alertness: alert, mildly confused about some events and clear about others Coordination:  walks  Gait:Patient's independently ambulatory in and out of the office improved from his previous visit   Diagnostic Studies &  Laboratory data:     Recent Radiology Findings:      [facility] at [facility]: Report only Chest CT without contrast  Indication: I48.2 Chronic atrial fibrillation, I71.4 Abdominal aortic aneurysm, without rupture, preop  Comparison: March 27, 2012  Protocol:  Volumetric non-contrast chest CT was performed from the lower neck through the adrenal glands.  This study was acquired without intravenous contrast given the patients indications for the examination. 2D multiplanar reconstruction including sagittal and coronal reconstruction was utilized to enhance assessment of the vasculature including sagittal and coronal reconstructed images. 5 x 5 mm axial maximum intensity projection images (MIPS) were reconstructed to facilitate lung nodule detection.  Findings: Vasculature The ascending thoracic aorta measures up to 4.0 cm. There are moderate mural calcifications. Fat planes between the descending aorta, pulmonary artery and right ventricle and the sternum are preserved. The right ventricle is 2.1 cm posterior to the xiphoid. Brachiocephalic vein is 2 cm posterior to the sternal cortex. The ascending aorta is 4.3 cm posterior to the sternal cortex. Moderate coronary artery calcifications.  Non vascular structures: Debris is seen within the mid trachea. The central airways are patent. Again seen is linear opacities in the right lower lobe with mild bronchiectasis with elevation the right hemidiaphragm, likely related to scarring. Mild centrilobular emphysema. Unchanged 4 mm right middle lobe pulmonary nodule (image 248, series 4). Unchanged 4 mm right lower lobe pulmonary nodule abutting the accessory fissure (image 261, series 4) . Unchanged 4 mm subpleural right lower lobe pulmonary nodule (image 298, series 4). Unchanged 5 mm right lower lobe pulmonary nodule (image 186, series 4). No pleural effusion.  Neck base is normal.  Mild cardiomegaly. No pericardial effusion.  No mediastinal, hilar, or axillary lymphadenopathy.  No suspicious lytic or sclerotic osseous lesions. Multilevel degenerative changes of the thoracolumbar spine.  Small hiatal hernia. The patient is status post cholecystectomy. Surgical clips are seen in the gallbladder fossa. Limited images of the upper abdomen are unremarkable.  Impression: 1.Sternotomy measurements as described above. 2.Coronary artery calcifications. 3.Stable pulmonary nodules.   Electronically Reviewed by:  Larry Sierras, MD Electronically Reviewed on:  09/19/2014 11:20 AM  I have reviewed the images  and concur with the above findings.  Electronically Signed by:  Beckie Salts, MD Electronically Signed on:  09/20/2014 12:06 PM  ECHO:  [facility]            [place], [place]                                                      [id number]    DOB: 1932/08/10                CARDIAC DIAGNOSTIC UNIT               Date: 09/18/2014 16:00:00                                                      Adult     Male   Age: 37                  ECHO-DOPPLER REPORT                 Inpatient                                                      [id number] ---------------------------------------------------- MD1: Ward, Lajoyce Corners     STUDY: Chest Wall          TAPE: 0000:00:0:00:00 BP: 143/66      ECHO: Yes   DOPPLER: Yes  FILE: [id number]:   HR: 98     COLOR: Yes  CONTRAST: No      MACHINE: IE33 #17  Height: 72 in RV BIOPSY: No         3D: No   SOUND QLTY: Moderate  Weight: 259 lbs    MEDIUM: None                                      BSA: 2.44,  BMI: 35.10 ------------------------------------------------------------------------------    HISTORY:  Arrhythmia and Valvular disease     REASON: Assess Aortic Regurgitation INDICATION: I48.2 - Chronic atrial fibrillation. I35.1 - Nonrheumatic aortic             (valve) insufficiency. Q25.3 - Supravalvular aortic stenosis.             I48.91 - Unspecified atrial  fibrillation.   ECHOCARDIOGRAPHIC MEASUREMENTS ----------------------------------------------- 2D DIMENSIONS AORTA          Values   Normal Range       MAIN PA      Values   Normal Range      Annulus:  nm*  cm  [2.3 - 2.9]           PA Main:  nm*  cm  [1.5 - 2.1]    Aorta Sin:   3.8 cm  [3.1 - 3.7]        RIGHT VENTRICLE  ST Junction:  nm*  cm  [2.6 - 3.2]  RV Base:  nm*  cm  [<= 4.2]    Asc.Aorta:   4.0 cm  [2.6 - 3.4]            RV Mid:  nm*  cm  [<= 3.5] LEFT VENTRICLE                              RV Length:  nm*  cm  [<= 8.6]        LVIDd:   6.0 cm  [4.2 - 5.9]        INFERIOR VENA CAVA        LVIDs:   4.4 cm                        Max.IVC:  nm*  cm  [<= 2.1]           FS:  27%      [> 25%]               Min.IVC:  nm*  cm  [<= 1.7]          SWT:   1.1 cm  [0.6 - 1.0]        __________________          PWT:   1.0 cm  [0.6 - 1.0]        nm* - not measured LEFT ATRIUM      LA Diam:   5.9 cm  [3.0 - 4.0]      LA Area:  39   cm2 [< 20]    LA Volume: 151   mL  [18 - 58]  ECHOCARDIOGRAPHIC DESCRIPTIONS ----------------------------------------------- AORTIC ROOT         Size: DILATED   Dissection: INDETERM FOR DISSECTION  AORTIC VALVE     Leaflets: Tricuspid             Morphology: MILDLY THICKENED     Mobility: Fully Mobile  LEFT VENTRICLE                                      Anterior: Normal         Size: Normal                                 Lateral: Normal  Contraction: REGIONALLY IMPAIRED                     Septal: HYPOCONTRACTILE   Closest EF: 50%  (Estimated)                        Apical: Normal    LV masses: No Masses                             Inferior: Normal          LVH: None                                 Posterior: Normal Dias.FxClass: Normal  MITRAL VALVE     Leaflets: Normal                  Mobility: Fully  mobile   Morphology: ANNULAR CALC  LEFT ATRIUM         Size: MODERATELY ENLARGED    LA masses: No masses               Normal IAS  MAIN  PA         Size: Normal  PULMONIC VALVE   Morphology: Normal     Mobility: Fully Mobile  RIGHT VENTRICLE         Size: Normal                    Free wall: Normal  Contraction: Normal                    RV masses: No Masses        TAPSE:   2.6 cm,  Normal Range [>= 1.6 cm]  TRICUSPID VALVE     Leaflets: Normal                  Mobility: Fully mobile   Morphology: Normal  RIGHT ATRIUM         Size: Normal                     RA Other: None    RA masses: No masses  PERICARDIUM        Fluid: No effusion  INFERIOR VENACAVA         Size: Normal     Normal respiratory collapse  DOPPLER ECHO and OTHER SPECIAL PROCEDURES ------------------------------------    Aortic: SEVERE AR              No AS     Mitral: No MR                  No MS    MV Inflow E Vel.= nm* cm/s  MV Annulus E'Vel.= nm* cm/s  E/E'Ratio= nm*  Tricuspid: TRIVIAL TR             No TS  Pulmonary: No PR                  No PS      Other:  INTERPRETATION ---------------------------------------------------------------   MILD LV DYSFUNCTION (See above)   NORMAL LA PRESSURES WITH NORMAL DIASTOLIC FUNCTION   NORMAL RIGHT VENTRICULAR SYSTOLIC FUNCTION   VALVULAR REGURGITATION: SEVERE AR, TRIVIAL TR   NO VALVULAR STENOSIS   Compared with prior Echo study on 11/13/2013: LEFT VENTRICULAR DIMENSIONS   ARE INCREASED; ASCENDING AORTA MEASURES LARGER BUT IS CONSISTENT WITH   MEASUREMENT FROM 03/17/2012 STUDY.   (Report version 3.0)                    Interpreted and Electronically signed  Perform. by: Percell Locus, RDCS                  by: Earlie Lou, MD  Resp.Person: Percell Locus, RDCS                  On: 09/18/2014 17:18:19  Recent Lab Findings: Lab Results  Component Value Date   WBC 5.7 10/17/2014   HGB 11.1* 10/17/2014   HCT 33.2* 10/17/2014   PLT 175 10/17/2014   GLUCOSE 99 10/18/2014   ALT 18 06/20/2014   AST 21 06/20/2014   NA 141 10/18/2014   K 3.4* 10/18/2014   CL 106 10/18/2014    CREATININE 1.61* 10/18/2014   BUN 10 10/18/2014  CO2 30 10/18/2014   TSH 3.763 10/15/2014   INR 1.17 06/20/2014   Chronic Kidney Disease   Stage I     GFR >90  Stage II    GFR 60-89  Stage IIIA GFR 45-59  Stage IIIB GFR 30-44  Stage IV   GFR 15-29  Stage V    GFR  <15  Cardiac Cath films at [facility]  In [facility]- has disease in right coronary 60-70 %   Assessment / Plan:   1/ Acute on chronic diastolic Heart failure, with preserved EF likely from symptomatic AI Patient appears euvolemic on exam today On  lasix  And linopril   2/Afib: rate controled and on eloquis - EKG, tele On  eloquis   3/CKD: patient baseline seems to be around 1.6, currently at baseline   4/depression/anxiety/insomnia  on lexapro, lamictal, lunesta   5/ HTN , HL  Treated  6/ hypothyroidism: TSH is  3.7      on synthroid     7/ stable pulmonary nodules per report from [facility]  I have seen the patient and reviewed his chart including cardiac cath and [facility]. Since first seen 3-4 weeks the patient has improved significantly his overall functional status . He has an appointment for neurology evaluation early next week . Before making any definite plans about open aortic valve replacement and coronary artery bypass grafting in consideration of Maze procedure we will explore the option of TAVR. The patient does have aortic insufficiency and per the report from [facility] very little calcium in his aortic valve or root .  After input from neurology and TAVR.  I will see the patient next week  Finalize a treatment plan.  Delight OvensEdward B Sanora Cunanan MD      [address] [place] [id number] Office [phone]   Beeper [phone]  11/28/2014 2:56 PM

## 2014-12-05 ENCOUNTER — Ambulatory Visit (INDEPENDENT_AMBULATORY_CARE_PROVIDER_SITE_OTHER): Payer: Medicare Other | Admitting: Cardiothoracic Surgery

## 2014-12-05 ENCOUNTER — Encounter: Payer: Self-pay | Admitting: Cardiothoracic Surgery

## 2014-12-05 ENCOUNTER — Ambulatory Visit (INDEPENDENT_AMBULATORY_CARE_PROVIDER_SITE_OTHER): Payer: Medicare Other | Admitting: Neurology

## 2014-12-05 ENCOUNTER — Encounter: Payer: Self-pay | Admitting: Neurology

## 2014-12-05 VITALS — BP 117/62 | HR 95 | Temp 97.1°F | Ht 72.0 in | Wt 248.0 lb

## 2014-12-05 VITALS — BP 117/53 | HR 72 | Resp 20 | Ht 72.0 in | Wt 248.0 lb

## 2014-12-05 DIAGNOSIS — R269 Unspecified abnormalities of gait and mobility: Secondary | ICD-10-CM | POA: Diagnosis not present

## 2014-12-05 DIAGNOSIS — R270 Ataxia, unspecified: Secondary | ICD-10-CM | POA: Diagnosis not present

## 2014-12-05 DIAGNOSIS — I25119 Atherosclerotic heart disease of native coronary artery with unspecified angina pectoris: Secondary | ICD-10-CM

## 2014-12-05 DIAGNOSIS — I351 Nonrheumatic aortic (valve) insufficiency: Secondary | ICD-10-CM | POA: Diagnosis not present

## 2014-12-05 DIAGNOSIS — R292 Abnormal reflex: Secondary | ICD-10-CM | POA: Diagnosis not present

## 2014-12-05 DIAGNOSIS — I4891 Unspecified atrial fibrillation: Secondary | ICD-10-CM

## 2014-12-05 DIAGNOSIS — E539 Vitamin B deficiency, unspecified: Secondary | ICD-10-CM

## 2014-12-05 DIAGNOSIS — R7302 Impaired glucose tolerance (oral): Secondary | ICD-10-CM

## 2014-12-05 DIAGNOSIS — G609 Hereditary and idiopathic neuropathy, unspecified: Secondary | ICD-10-CM

## 2014-12-05 DIAGNOSIS — I5032 Chronic diastolic (congestive) heart failure: Secondary | ICD-10-CM | POA: Diagnosis not present

## 2014-12-05 DIAGNOSIS — R251 Tremor, unspecified: Secondary | ICD-10-CM | POA: Diagnosis not present

## 2014-12-05 DIAGNOSIS — M542 Cervicalgia: Secondary | ICD-10-CM | POA: Diagnosis not present

## 2014-12-05 DIAGNOSIS — G8929 Other chronic pain: Secondary | ICD-10-CM | POA: Diagnosis not present

## 2014-12-05 NOTE — Progress Notes (Signed)
[address]       [place] [id number]             [phone]                    [name] [name] [facility] Medical Record #[id number] Date of Birth: Nov 02, 1931  Referring: [name] [name],* Primary Care: [name] [name]  Chief Complaint:    Chief Complaint  Patient presents with  . Aortic Insuffiency    1 week f/u to further discuss surgery  . Coronary Artery Disease  . Atrial Fibrillation    History of Present Illness:    [name] [name] 81 y.o. male is seen in the office  concerning aortic valve replacement and coronary artery bypass grafting.He has been followed at [facility] for many years with  aortic valve insufficiency atrial fibrillation and hypertension. The patient was  hospital[facility] at [facility] with increasing dyspnea on exertion and difficulty carrying objects and caring for his usual activities. He  underwent further evaluation for consideration of surgery including cardiac cath CT scan of the chest and echocardiogram. At that time it was recommended to him that he needed an aortic valve replacement and coronary artery bypass but was not felt to be strong enough and was discharge to a nursing home. He saw me 4 weeks ago for  opinion concerning   cardiac surgery . At that time he was still very weak and not independent .    The patient comes into the office today on his own having driven to the office alone. He notes that he's moving residents today and has been actively doing this this morning. Overall marked improvement from the first visit us on. He has seen neurology but their evaluation is not complete an MRI scan of the brain is scheduled for the very near future.   Current Activity/ Functional Status:  Patient is  independent with mobility/ambulation, transfers, ADL's, IADL's. . His son notes that he has been walking has markedly improved.  He denies chest pain   Note from [facility]. [name] [name] presented for pre-hydration prior to  RHC/LHC. TTE on 09/18/2014 showed an LVEF 50%, LVIDs 4.4 cm, severe AR. RHC/LHC on 09/20/2014 showed a RA 11 RV 50/13 PA 47/25 (33) PCWP 18 CI 1.6 PVR 4.0 and a 70% distal RCA lesion. After discussion among the heart team, given his frailty, the decision was made to send him to a SNF given his frailty and have him re-evaluated in clinic by [name] [name] for CABG+AVR. He would likely be a poor TAVR candidate given the absence of Ca in his AoV annulus (CT Chest obtained while in house).   [name] Score: At the time of surgery this patient's most appropriate activity status/level should be described as: []     0    Normal activity, no symptoms [x]     1    Restricted in physical strenuous activity but ambulatory, able to do out light work []     2    Ambulatory and capable of self care, unable to do work activities, up and about               >50 % of waking hours                              []     3    Only limited self care, in bed greater than  50% of waking hours []     4    Completely disabled, no self care, confined to bed or chair []     5    Moribund   Past Medical History  Diagnosis Date  . Atrial fibrillation   . Hypothyroidism   . Plantar fasciitis   . Seasonal allergies   . Decreased testosterone level   . Varicose veins   . Hypertension   . Hypercholesteremia   . Aortic valve insufficiency   . Depression   . Heart murmur   . DVT (deep venous thrombosis) ~ 2013    "BLE"  . OSA (obstructive sleep apnea)     "suppose to wear mask; I throw it off in my sleep" (10/15/2014)  . COPD (chronic obstructive pulmonary disease)   . Emphysema of lung   . History of blood transfusion 1960    "lots; related to an accident"  . Arthritis     "wee bit; knees, elbows" (10/15/2014)  . Anxiety   . Chronic kidney disease     "down to ~ 1/2 of their regular use; I see kidney dr. in [place]" (10/15/2014)  . Falls frequently     > 20 times in the last year/notes 10/15/2014  . Syncope and collapse "several  times"    Past Surgical History  Procedure Laterality Date  . Abdominal aortic aneurysm repair  01/2006; 03/10/2006    [name] [name] 01/12/2011  . Tonsillectomy    . Hernia repair    . Laparoscopic incisional / umbilical / ventral hernia repair  02/2010    VHR w/mesh/notes 03/28/2010  . Laparoscopic cholecystectomy  08/2009    w/IOC/notes 09/18/2009  . Ercp  11/2009    [name] [name] 12/05/2009  . Cardiac catheterization  1990's X 1; 09/2014  . Brain surgery  1960 X 4    "S/P got my skull busted"  . Gall stone    previous head injury 1960 to hospitalize 93 days  Family History  Problem Relation Age of Onset  . Heart disease Father   . Rheum arthritis Mother   . Rheum arthritis Father   . Cancer Sister   . Stroke Sister     History   Social History  . Marital Status: Married    Spouse Name: [name] [name]  . Number of Children: 1  . Years of Education: Master's   Occupational History  . Not on file.   Social History Main Topics  . Smoking status: Former Smoker -- 2.00 packs/day for 22 years    Types: Cigarettes    Quit date: 04/29/1974  . Smokeless tobacco: Never Used  . Alcohol Use: 0.0 oz/week    0 Standard drinks or equivalent per week     Comment: 1 beer per year   . Drug Use: No  . Sexual Activity: Not Currently   Other Topics Concern  . Not on file   Social History Narrative   Lives at home with wife.   Right handed.   Caffeine use: 3 cups coffee per day.   Drinks 4 cups tea per day   Drinks soda very rarely     History  Smoking status  . Former Smoker -- 2.00 packs/day for 22 years  . Types: Cigarettes  . Quit date: 04/29/1974  Smokeless tobacco  . Never Used    History  Alcohol Use  . 0.0 oz/week  . 0 Standard drinks or equivalent per week    Comment: 1 beer per year      No Known  Allergies  Current Outpatient Prescriptions  Medication Sig Dispense Refill  . acetaminophen (TYLENOL) 325 MG tablet Take 2 tablets (650 mg total) by mouth every 6 (six) hours as  needed for mild pain (or Fever >/= 101).    Marland Kitchen albuterol (PROVENTIL HFA;VENTOLIN HFA) 108 (90 BASE) MCG/ACT inhaler Inhale 2 puffs into the lungs every 6 (six) hours as needed for wheezing or shortness of breath.    Marland Kitchen amLODipine (NORVASC) 5 MG tablet Take 5 mg by mouth daily.    Marland Kitchen apixaban (ELIQUIS) 2.5 MG TABS tablet Take 1 tablet (2.5 mg total) by mouth 2 (two) times daily. 60 tablet 3  . atorvastatin (LIPITOR) 10 MG tablet Take 10 mg by mouth daily at 6 PM.    . buPROPion (WELLBUTRIN XL) 300 MG 24 hr tablet Take 300 mg by mouth daily.    . busPIRone (BUSPAR) 15 MG tablet Take 15 mg by mouth 2 (two) times daily.    . calcium carbonate (TUMS - DOSED IN MG ELEMENTAL CALCIUM) 500 MG chewable tablet Chew 1 tablet by mouth as needed for indigestion or heartburn.    . clonazePAM (KLONOPIN) 0.5 MG tablet Take 1 tablet (0.5 mg total) by mouth at bedtime as needed for anxiety. 30 tablet 0  . digoxin (LANOXIN) 0.125 MG tablet Take 0.125 mg by mouth daily.    Marland Kitchen diltiazem (TIAZAC) 120 MG 24 hr capsule Take 120 mg by mouth daily.    Marland Kitchen dutasteride (AVODART) 0.5 MG capsule Take 0.5 mg by mouth daily.    Marland Kitchen escitalopram (LEXAPRO) 20 MG tablet Take 20 mg by mouth daily.    . Eszopiclone (ESZOPICLONE) 3 MG TABS Take 3 mg by mouth at bedtime. Take immediately before bedtime    . fluticasone (FLONASE) 50 MCG/ACT nasal spray Place 1 spray into both nostrils daily. 9.9 g 2  . folic acid (FOLVITE) 1 MG tablet Take 1 mg by mouth daily.    Marland Kitchen lamoTRIgine (LAMICTAL) 100 MG tablet Take 100 mg by mouth 2 (two) times daily. Breakfast and dinner    . levothyroxine (SYNTHROID, LEVOTHROID) 150 MCG tablet Take 150 mcg by mouth daily before breakfast.    . Melatonin 3 MG TABS Take 1 tablet by mouth at bedtime.     No current facility-administered medications for this visit.      Review of Systems:     Cardiac Review of Systems: Y or N  Chest Pain [ n   ]  Resting SOB [ y  ] Exertional SOB  [ y ]  Orthopnea [place]ienne  ]   Pedal  Edema [ y  ]    Palpitations [ y ] Syncope  [ n ]   Presyncope [ y  ]  General Review of Systems: [Y] = yes [  ]=no Constitional: recent weight change [ y ];  Wt loss over the last 3 months [   ] anorexia [  ]; fatigue [  ]; nausea [  ]; night sweats [  ]; fever [  ]; or chills [  ];          Dental: poor dentition[  ]; Last Dentist visit:   Eye : blurred vision [  ]; diplopia [   ]; vision changes [  ];  Amaurosis fugax[  ]; Resp: cough [ y ];  wheezingy[  ];  hemoptysis[n  ]; shortness of breath[ y ]; paroxysmal nocturnal dyspnea[ y ]; dyspnea on exertion[  y]; or orthopnea[ y ];  GI:  gallstones[  ], vomiting[  ];  dysphagia[  ]; melena[  ];  hematochezia [  ]; heartburn[  ];   Hx of  Colonoscopy[y  ]; GU: kidney stones [  ]; hematuria[  ];   dysuria [  ];  nocturia[  ];  history of     obstruction [  ]; urinary frequency [  ]             Skin: rash, swelling[  ];, hair loss[  ];  peripheral edema[  ];  or itching[  ]; Musculosketetal: myalgias[  ];  joint swelling[  ];  joint erythema[  ];  joint pain[  ];  back pain[  ];  Heme/Lymph: bruising[ y ];  bleeding[ n ];  anemia[ n ];  Neuro: TIA[  ];  headaches[  ];  stroke[n  ];  vertigo[ y ];  seizures[n  ];   paresthesias[  ];  difficulty walking[y  ];  Psych:depression[y  ]; anxiety[y  ];  Endocrine: diabetes[n  ];  thyroid dysfunction[ y ];  Immunizations: Flu up to date [  ]; Pneumococcal up to date [  ];  Other:  Physical Exam: BP 117/53 mmHg  Pulse 72  Resp 20  Ht 6' (1.829 m)  Wt 248 lb (112.492 kg)  BMI 33.63 kg/m2  SpO2 96%  PHYSICAL EXAMINATION: General appearance: alert, cooperative and patient is very alert today and talking about articles he read in the recent [employer] a bite hyperthermia  Head: Normocephalic, without obvious abnormality, atraumatic Neck: no adenopathy, no carotid bruit, no JVD, supple, symmetrical, trachea midline, thyroid not enlarged, symmetric, no tenderness/mass/nodules and scar from left  carotid surgery Lymph nodes: Cervical, supraclavicular, and axillary nodes normal. Resp: clear to auscultation bilaterally Back: symmetric, no curvature. ROM normal. No CVA tenderness. Cardio: irregularly irregular rhythm GI: soft, non-tender; bowel sounds normal; no masses,  no organomegaly Extremities: edema mild bilaterial edema Neurologic: Mental status: Alert, oriented, thought content appropriate, alertness: alert, mildly confused about some events and clear about others Coordination:  walks  Gait:Patient's independently ambulatory in and out of the office improved from his previous visit   Diagnostic Studies & Laboratory data:     Recent Radiology Findings:      [facility]scan at [facility]: Report only Chest CT without contrast  Indication: I48.2 Chronic atrial fibrillation, I71.4 Abdominal aortic aneurysm, without rupture, preop  Comparison: March 27, 2012  Protocol:  Volumetric non-contrast chest CT was performed from the lower neck through the adrenal glands.  This study was acquired without intravenous contrast given the patients indications for the examination. 2D multiplanar reconstruction including sagittal and coronal reconstruction was utilized to enhance assessment of the vasculature including sagittal and coronal reconstructed images. 5 x 5 mm axial maximum intensity projection images (MIPS) were reconstructed to facilitate lung nodule detection.  Findings: Vasculature The ascending thoracic aorta measures up to 4.0 cm. There are moderate mural calcifications. Fat planes between the descending aorta, pulmonary artery and right ventricle and the sternum are preserved. The right ventricle is 2.1 cm posterior to the xiphoid. Brachiocephalic vein is 2 cm posterior to the sternal cortex. The ascending aorta is 4.3 cm posterior to the sternal cortex. Moderate coronary artery calcifications.  Non vascular structures: Debris is seen within the mid trachea. The central  airways are patent. Again seen is linear opacities in the right lower lobe with mild bronchiectasis with elevation the right hemidiaphragm, likely related to scarring. Mild centrilobular emphysema. Unchanged 4 mm right middle lobe pulmonary  nodule (image 248, series 4). Unchanged 4 mm right lower lobe pulmonary nodule abutting the accessory fissure (image 261, series 4) . Unchanged 4 mm subpleural right lower lobe pulmonary nodule (image 298, series 4). Unchanged 5 mm right lower lobe pulmonary nodule (image 186, series 4). No pleural effusion.  Neck base is normal.  Mild cardiomegaly. No pericardial effusion. No mediastinal, hilar, or axillary lymphadenopathy.  No suspicious lytic or sclerotic osseous lesions. Multilevel degenerative changes of the thoracolumbar spine.  Small hiatal hernia. The patient is status post cholecystectomy. Surgical clips are seen in the gallbladder fossa. Limited images of the upper abdomen are unremarkable.  Impression: 1.Sternotomy measurements as described above. 2.Coronary artery calcifications. 3.Stable pulmonary nodules.   Electronically Reviewed by:  Larry Sierras, [name] Electronically Reviewed on:  09/19/2014 11:20 AM  I have reviewed the images and concur with the above findings.  Electronically Signed by:  Beckie Salts, [name] Electronically Signed on:  09/20/2014 12:06 PM  ECHO:  [facility]            [place], [place]                                                      [id number]    DOB: 11-10-1931                CARDIAC DIAGNOSTIC UNIT               Date: 09/18/2014 16:00:00                                                      Adult     Male   Age: 40                  ECHO-DOPPLER REPORT                 Inpatient                                                      [id number] ---------------------------------------------------- MD1: Ward, Lajoyce Corners     STUDY: Chest Wall          TAPE: 0000:00:0:00:00 BP: 143/66       ECHO: Yes   DOPPLER: Yes  FILE: [id number]:   HR: 98     COLOR: Yes  CONTRAST: No      MACHINE: IE33 #[id number]  Height: 72 in RV BIOPSY: No         3D: No   SOUND QLTY: Moderate  Weight: 259 lbs    MEDIUM: None                                      BSA: 2.44,  BMI: 35.10 ------------------------------------------------------------------------------    HISTORY:  Arrhythmia and Valvular disease     REASON: Assess Aortic Regurgitation INDICATION: I48.2 - Chronic atrial fibrillation. I35.1 - Nonrheumatic aortic             (  valve) insufficiency. Q25.3 - Supravalvular aortic stenosis.             I48.91 - Unspecified atrial fibrillation.   ECHOCARDIOGRAPHIC MEASUREMENTS ----------------------------------------------- 2D DIMENSIONS AORTA          Values   Normal Range       MAIN PA      Values   Normal Range      Annulus:  nm*  cm  [2.3 - 2.9]           PA Main:  nm*  cm  [1.5 - 2.1]    Aorta Sin:   3.8 cm  [3.1 - 3.7]        RIGHT VENTRICLE  ST Junction:  nm*  cm  [2.6 - 3.2]           RV Base:  nm*  cm  [<= 4.2]    Asc.Aorta:   4.0 cm  [2.6 - 3.4]            RV Mid:  nm*  cm  [<= 3.5] LEFT VENTRICLE                              RV Length:  nm*  cm  [<= 8.6]        LVIDd:   6.0 cm  [4.2 - 5.9]        INFERIOR VENA CAVA        LVIDs:   4.4 cm                        Max.IVC:  nm*  cm  [<= 2.1]           FS:  27%      [> 25%]               Min.IVC:  nm*  cm  [<= 1.7]          SWT:   1.1 cm  [0.6 - 1.0]        __________________          PWT:   1.0 cm  [0.6 - 1.0]        nm* - not measured LEFT ATRIUM      LA Diam:   5.9 cm  [3.0 - 4.0]      LA Area:  39   cm2 [< 20]    LA Volume: 151   mL  [18 - 58]  ECHOCARDIOGRAPHIC DESCRIPTIONS ----------------------------------------------- AORTIC ROOT         Size: DILATED   Dissection: INDETERM FOR DISSECTION  AORTIC VALVE     Leaflets: Tricuspid             Morphology: MILDLY THICKENED     Mobility: Fully Mobile  LEFT VENTRICLE                                       Anterior: Normal         Size: Normal                                 Lateral: Normal  Contraction: REGIONALLY IMPAIRED                     Septal: HYPOCONTRACTILE  Closest EF: 50%  (Estimated)                        Apical: Normal    LV masses: No Masses                             Inferior: Normal          LVH: None                                 Posterior: Normal Dias.FxClass: Normal  MITRAL VALVE     Leaflets: Normal                  Mobility: Fully mobile   Morphology: ANNULAR CALC  LEFT ATRIUM         Size: MODERATELY ENLARGED    LA masses: No masses               Normal IAS  MAIN PA         Size: Normal  PULMONIC VALVE   Morphology: Normal     Mobility: Fully Mobile  RIGHT VENTRICLE         Size: Normal                    Free wall: Normal  Contraction: Normal                    RV masses: No Masses        TAPSE:   2.6 cm,  Normal Range [>= 1.6 cm]  TRICUSPID VALVE     Leaflets: Normal                  Mobility: Fully mobile   Morphology: Normal  RIGHT ATRIUM         Size: Normal                     RA Other: None    RA masses: No masses  PERICARDIUM        Fluid: No effusion  INFERIOR VENACAVA         Size: Normal     Normal respiratory collapse  DOPPLER ECHO and OTHER SPECIAL PROCEDURES ------------------------------------    Aortic: SEVERE AR              No AS     Mitral: No MR                  No MS    MV Inflow E Vel.= nm* cm/s  MV Annulus E'Vel.= nm* cm/s  E/E'Ratio= nm*  Tricuspid: TRIVIAL TR             No TS  Pulmonary: No PR                  No PS      Other:  INTERPRETATION ---------------------------------------------------------------   MILD LV DYSFUNCTION (See above)   NORMAL LA PRESSURES WITH NORMAL DIASTOLIC FUNCTION   NORMAL RIGHT VENTRICULAR SYSTOLIC FUNCTION   VALVULAR REGURGITATION: SEVERE AR, TRIVIAL TR   NO VALVULAR STENOSIS   Compared with prior Echo study on 11/13/2013: LEFT VENTRICULAR DIMENSIONS   ARE  INCREASED; ASCENDING AORTA MEASURES LARGER BUT IS CONSISTENT WITH   MEASUREMENT FROM 03/17/2012 STUDY.   (Report version 3.0)  Interpreted and Electronically signed  Perform. by: Percell Locus, RDCS                  by: Earlie Lou, [name]  Resp.Person: Percell Locus, RDCS                  On: 09/18/2014 17:18:19  Recent Lab Findings: Lab Results  Component Value Date   WBC 5.7 10/17/2014   HGB 11.1* 10/17/2014   HCT 33.2* 10/17/2014   PLT 175 10/17/2014   GLUCOSE 99 10/18/2014   ALT 18 06/20/2014   AST 21 06/20/2014   NA 141 10/18/2014   K 3.4* 10/18/2014   CL 106 10/18/2014   CREATININE 1.61* 10/18/2014   BUN 10 10/18/2014   CO2 30 10/18/2014   TSH 3.763 10/15/2014   INR 1.17 06/20/2014   Chronic Kidney Disease   Stage I     GFR >90  Stage II    GFR 60-89  Stage IIIA GFR 45-59  Stage IIIB GFR 30-44  Stage IV   GFR 15-29  Stage V    GFR  <15  [facility]iac [facility]  [facility]- has disease in right coronary 60-70 %   Assessment / Plan:   1/ Acute on chronic diastolic Heart failure, with preserved EF likely from symptomatic AI Patient appears euvolemic on exam today On  lasix  And linopril   2/Afib: rate controled and on eloquis - EKG, tele On  eloquis   3/CKD: patient baseline seems to be around 1.6, currently at baseline   4/depression/anxiety/insomnia  on lexapro, lamictal, lunesta   5/ HTN , HL  Treated  6/ hypothyroidism: TSH is  3.7      on synthroid     7/ stable pulmonary nodules per report from [facility]  I have seen the patient and reviewed his chart including cardiac cath and echo films from [facility]. Since first seen 5 weeks ago weeks the patient has has made very significant improvement his overall functional status . He is now independent growth to the office on his own today. He had appointment for neurology evaluation early yesterday, there evaluation is not complete he's to have a MRI of the brain next week.   I have  reviewed the patient's information studies from [facility] with Dr. Excell Seltzer in regard to being a possible TAVER  candidate. Because of the patient's primary aortic insufficiency and very little calcium in the aortic root Or would not be technically suitable. I have discussed with the patient further open aortic valve replacement and coronary artery bypass grafting in consideration of Maze procedure. He is willing to proceed but would like to wait till the middle of the summer because of issues with his wife. I discouraged this space to considering the improvement he has made over the past 5 weeks and his rehabilitation. He will complete his neurologic evaluation with MRI of the brain. He will return to see me in 2 weeks with his son and we'll make a final decision about timing of surgery. The patient is agreeable with this   [name] [name]      [address] [place] [address] Office [phone]   Beeper [phone]  12/05/2014 3:23 PM

## 2014-12-05 NOTE — Progress Notes (Signed)
[facility]    Provider:  Dr Lucia Gaskins Referring Provider: Cain Saupe, MD Primary Care Physician:  Cain Saupe, MD  CC:  staggering  HPI:  [name] [name] is a 81 y.o. male here as a referral from Dr. Jillyn Hidden for abnormal gait, tremor.  He is walking and staggers. Started 2 months ago. Happens every time he walks, "constant". He is going to have heart surgery and he went to physical therapy and his walking has improved. Symptoms started after he fell and hurt his hip, he has been falling. Doesn't know why he is falling, he gets "woozy" and he needs bypass and has afib. He has had 12 falls. He refuses to use a walking aid. Doesn't know why he falls. Last time he fell was in rehab a month ago. His legs are so weak that if he comes to a "hump, it pushes me over one way". He has bad knees and that is affecting him as well. No low back pain. Has tremors in his hand but none today and says the tremors have gone away after changed medication. He shuffles a litle if he doesn't know it but normally walks pretty fast and goes up and down stairs. He can get out of low seats without his hands. No numbness or tingling or burning. Legs get fatigued but no real weakness. Has neck pain, no shooting pain down the arms and no weakness.   Reviewed notes, labs and imaging from outside physicians, which showed: CT of the head showed atrophy and non-specific white matter changes  Review of Systems: Patient complains of symptoms per HPI as well as the following symptoms: fatigue, murmur, SOB, cough, wheezing, snoring, hearing loss, ringing in ears, spinning sensation, trouble swallowing, incontinence, joint pain, aching muscles, itching, urintaion prollems, impotence, runny nose, memory loss, confusion, snoring, depression, anxiety, decreased energy, disinterest in activity, hallucinations. Pertinent negatives per HPI. All others negative.   History   Social History  . Marital Status: Married      Spouse Name: [name]  . Number of Children: 1  . Years of Education: Master's   Occupational History  . Not on file.   Social History Main Topics  . Smoking status: Former Smoker -- 2.00 packs/day for 22 years    Types: Cigarettes    Quit date: 04/29/1974  . Smokeless tobacco: Never Used  . Alcohol Use: 0.0 oz/week    0 Standard drinks or equivalent per week     Comment: 1 beer per year   . Drug Use: No  . Sexual Activity: Not Currently   Other Topics Concern  . Not on file   Social History Narrative   Lives at home with wife.   Right handed.   Caffeine use: 3 cups coffee per day.   Drinks 4 cups tea per day   Drinks soda very rarely     Family History  Problem Relation Age of Onset  . Heart disease Father   . Rheum arthritis Mother   . Rheum arthritis Father   . Cancer Sister   . Stroke Sister     Past Medical History  Diagnosis Date  . Atrial fibrillation   . Hypothyroidism   . Plantar fasciitis   . Seasonal allergies   . Decreased testosterone level   . Varicose veins   . Hypertension   . Hypercholesteremia   . Aortic valve insufficiency   . Depression   . Heart murmur   . DVT (deep venous thrombosis) ~ 2013    "  BLE"  . OSA (obstructive sleep apnea)     "suppose to wear mask; I throw it off in my sleep" (10/15/2014)  . COPD (chronic obstructive pulmonary disease)   . Emphysema of lung   . History of blood transfusion 1960    "lots; related to an accident"  . Arthritis     "wee bit; knees, elbows" (10/15/2014)  . Anxiety   . Chronic kidney disease     "down to ~ 1/2 of their regular use; I see kidney dr. in [place]" (10/15/2014)  . Falls frequently     > 20 times in the last year/notes 10/15/2014  . Syncope and collapse "several times"    Past Surgical History  Procedure Laterality Date  . Abdominal aortic aneurysm repair  01/2006; 03/10/2006    Hattie Perch/notes 01/12/2011  . Tonsillectomy    . Hernia repair    . Laparoscopic incisional / umbilical  / ventral hernia repair  02/2010    VHR w/mesh/notes 03/28/2010  . Laparoscopic cholecystectomy  08/2009    w/IOC/notes 09/18/2009  . Ercp  11/2009    Hattie Perch/notes 12/05/2009  . Cardiac catheterization  1990's X 1; 09/2014  . Brain surgery  1960 X 4    "S/P got my skull busted"  . Gall stone      Current Outpatient Prescriptions  Medication Sig Dispense Refill  . acetaminophen (TYLENOL) 325 MG tablet Take 2 tablets (650 mg total) by mouth every 6 (six) hours as needed for mild pain (or Fever >/= 101).    Marland Kitchen. albuterol (PROVENTIL HFA;VENTOLIN HFA) 108 (90 BASE) MCG/ACT inhaler Inhale 2 puffs into the lungs every 6 (six) hours as needed for wheezing or shortness of breath.    Marland Kitchen. amLODipine (NORVASC) 5 MG tablet Take 5 mg by mouth daily.    Marland Kitchen. apixaban (ELIQUIS) 2.5 MG TABS tablet Take 1 tablet (2.5 mg total) by mouth 2 (two) times daily. 60 tablet 3  . atorvastatin (LIPITOR) 10 MG tablet Take 10 mg by mouth daily at 6 PM.    . buPROPion (WELLBUTRIN XL) 300 MG 24 hr tablet Take 300 mg by mouth daily.    . busPIRone (BUSPAR) 15 MG tablet Take 15 mg by mouth 2 (two) times daily.    . calcium carbonate (TUMS - DOSED IN MG ELEMENTAL CALCIUM) 500 MG chewable tablet Chew 1 tablet by mouth as needed for indigestion or heartburn.    . clonazePAM (KLONOPIN) 0.5 MG tablet Take 1 tablet (0.5 mg total) by mouth at bedtime as needed for anxiety. 30 tablet 0  . digoxin (LANOXIN) 0.125 MG tablet Take 0.125 mg by mouth daily.    Marland Kitchen. diltiazem (TIAZAC) 120 MG 24 hr capsule Take 120 mg by mouth daily.    Marland Kitchen. dutasteride (AVODART) 0.5 MG capsule Take 0.5 mg by mouth daily.    Marland Kitchen. escitalopram (LEXAPRO) 20 MG tablet Take 20 mg by mouth daily.    . Eszopiclone (ESZOPICLONE) 3 MG TABS Take 3 mg by mouth at bedtime. Take immediately before bedtime    . fluticasone (FLONASE) 50 MCG/ACT nasal spray Place 1 spray into both nostrils daily. 9.9 g 2  . folic acid (FOLVITE) 1 MG tablet Take 1 mg by mouth daily.    Marland Kitchen. lamoTRIgine (LAMICTAL) 100  MG tablet Take 100 mg by mouth 2 (two) times daily. Breakfast and dinner    . levothyroxine (SYNTHROID, LEVOTHROID) 150 MCG tablet Take 150 mcg by mouth daily before breakfast.    . Melatonin 3 MG TABS Take  1 tablet by mouth at bedtime.     No current facility-administered medications for this visit.    Allergies as of 12/05/2014  . (No Known Allergies)    Vitals: BP 117/62 mmHg  Pulse 95  Temp(Src) 97.1 F (36.2 C)  Ht 6' (1.829 m)  Wt 248 lb (112.492 kg)  BMI 33.63 kg/m2 Last Weight:  Wt Readings from Last 1 Encounters:  12/05/14 248 lb (112.492 kg)   Last Height:   Ht Readings from Last 1 Encounters:  12/05/14 6' (1.829 m)   Physical exam: Exam: Gen: NAD, conversant, well nourised, obese, well groomed                     CV: irregular, +SEM. No Carotid Bruits. No peripheral edema, warm, nontender Eyes: Conjunctivae clear without exudates or hemorrhage  Neuro: Detailed Neurologic Exam  Speech:    Speech is normal; fluent and spontaneous with normal comprehension.  Cognition:    The patient is oriented to person, place, and time;     recent and remote grossly intact;     language fluent;     normal attention, concentration,     fund of knowledge grossly intact Cranial Nerves:    The pupils are equal, round, and reactive to light. The fundi are not visualized due to small pupils, attempted.  Visual fields are full to finger confrontation. Extraocular movements are intact. Trigeminal sensation is intact and the muscles of mastication are normal. The face is symmetric. The palate elevates in the midline. Hearing impaired . Voice is normal. Shoulder shrug is normal. The tongue has normal motion without fasciculations.   Coordination:    No dysmetria  Gait:    Good stride and arm swing. Not shuffling. Can heel and toe walk. dififculty with tandem.   Motor Observation:    Mild postural tremor  Tone:    Normal muscle tone.    Posture:    Posture is normal.       Strength:    Strength is V/V in the upper and lower limbs.      Sensation:  Intact pp Decreased temperature to mid calf in lowers, intact uppers Intact proprioception great toes 6 seconds vibration bilateral great toes     Reflex Exam:  DTR's: Absent achilles otherwise deep tendon reflexes in the upper and lower extremities are brisk bilaterally.   Toes:    The toes are downgoing bilaterally.   Clonus:    Clonus is absent.       Assessment/Plan:  81 year old male with PMHx severe OSA, HTN, hypothyroid, depression, HLD, , glucose intolerance, CRI, pancreatitis, emphysema p/w ataxia, postural tremor, mild distal sensory neuropathy. No parkinsonism. Unclear etiology of falls and ataxia. May be multifactorial including polypharmacy, arthritis and knee pain, neuropathy. Will order MRI of the brain and MRI of the cervical cord to eval for UMN lesions that can cause ataxia. Will also order a neuropathy serum screen. givem sensory neuropathy and tremor, will order anti-mag antibodies as well. Will hold on emg/ncs at this time, will re-evaluate after results from work up. F/u 3 months. He is not using wlking aid, he is suppsed to. Highly encouraged him as he is a fall risk.   Naomie Dean, MD  [facility] [address] [place], [place] [address]  Phone [phone] Fax [phone]

## 2014-12-05 NOTE — Patient Instructions (Addendum)
Overall you are doing fairly well but I do want to suggest a few things today:   Remember to drink plenty of fluid, eat healthy meals and do not skip any meals. Try to eat protein with a every meal and eat a healthy snack such as fruit or nuts in between meals. Try to keep a regular sleep-wake schedule and try to exercise daily, particularly in the form of walking, 20-30 minutes a day, if you can.   As far as diagnostic testing: labs, MRI of the brain and cervical spine. Encourage walking aids (cane or walker) to avoid falls  I would like to see you back in 4 months, sooner if we need to. Please call us with any interim questions, concerns, problems, updates or refill requests.   Please also call us for any test results so we can go over those with you on the phone.  My clinical assistant and will answer any of your questions and relay your messages to me and also relay most of my messages to you.   Our phone number is [phone]. We also have an after hours call service for urgent matters and there is a physician on-call for urgent questions. For any emergencies you know to call 911 or go to the nearest emergency room

## 2014-12-09 ENCOUNTER — Ambulatory Visit: Payer: Medicare Other | Attending: Family Medicine

## 2014-12-09 DIAGNOSIS — R269 Unspecified abnormalities of gait and mobility: Secondary | ICD-10-CM | POA: Diagnosis not present

## 2014-12-09 DIAGNOSIS — R2689 Other abnormalities of gait and mobility: Secondary | ICD-10-CM | POA: Insufficient documentation

## 2014-12-09 DIAGNOSIS — R262 Difficulty in walking, not elsewhere classified: Secondary | ICD-10-CM | POA: Diagnosis not present

## 2014-12-09 DIAGNOSIS — R279 Unspecified lack of coordination: Secondary | ICD-10-CM | POA: Diagnosis not present

## 2014-12-09 NOTE — Therapy (Signed)
[facility] [address] [place], [place], [address] Phone: [phone]   Fax:  [phone]  Physical Therapy Evaluation  Patient Details  Name: [name] [name] MRN: [id number] Date of Birth: 23-Jan-1932 Referring Provider:  Cain Saupe, MD  Encounter Date: 12/09/2014      PT End of Session - 12/09/14 1637    Visit Number 1   Number of Visits 17   Date for PT Re-Evaluation 02/07/15   Authorization Type Medicare-G codes required every 10th visit   PT Start Time 1534   PT Stop Time 1620   PT Time Calculation (min) 46 min      Past Medical History  Diagnosis Date  . Atrial fibrillation   . Hypothyroidism   . Plantar fasciitis   . Seasonal allergies   . Decreased testosterone level   . Varicose veins   . Hypertension   . Hypercholesteremia   . Aortic valve insufficiency   . Depression   . Heart murmur   . DVT (deep venous thrombosis) ~ 2013    "BLE"  . OSA (obstructive sleep apnea)     "suppose to wear mask; I throw it off in my sleep" (10/15/2014)  . COPD (chronic obstructive pulmonary disease)   . Emphysema of lung   . History of blood transfusion 1960    "lots; related to an accident"  . Arthritis     "wee bit; knees, elbows" (10/15/2014)  . Anxiety   . Chronic kidney disease     "down to ~ 1/2 of their regular use; I see kidney dr. in [place]" (10/15/2014)  . Falls frequently     > 20 times in the last year/notes 10/15/2014  . Syncope and collapse "several times"    Past Surgical History  Procedure Laterality Date  . Abdominal aortic aneurysm repair  01/2006; 03/10/2006    Hattie Perch 01/12/2011  . Tonsillectomy    . Hernia repair    . Laparoscopic incisional / umbilical / ventral hernia repair  02/2010    VHR w/mesh/notes 03/28/2010  . Laparoscopic cholecystectomy  08/2009    w/IOC/notes 09/18/2009  . Ercp  11/2009    Hattie Perch 12/05/2009  . Cardiac catheterization  1990's X 1; 09/2014  . Brain  surgery  1960 X 4    "S/P got my skull busted"  . Gall stone      There were no vitals filed for this visit.  Visit Diagnosis:  Imbalance  Lack of coordination  Abnormality of gait  Difficulty walking up stairs      Subjective Assessment - 12/09/14 1604    Subjective Pt will be having a heart surgery soon: valve replacement and a CABG x1. Pt reports that he will be having the surgery scheduled soon. He recently discharged from PT for balance impairment, but just received a new order for continued PT for balance impairment. Pt reports dizziness in the mornings and at night with bed mobility and with turning. He also reports having to acatch the walls when he is washing his hair in the shoulder. Pt also reports feeling feeling less steady when negotiating threshold to enter house and when on uneven surfaces.    Patient Stated Goals to improve balance   Currently in Pain? No/denies            [facility] PT Assessment - 12/09/14 0001    Assessment   Medical Diagnosis Recurrent Falls R29.6   Onset Date --  16 months ago   Prior Therapy --  PT for falls ended 2 weeks ago   Precautions   Precautions Fall;Other (comment)  check vitals as needed   Balance Screen   Has the patient fallen in the past 6 months Yes   How many times? 7   Has the patient had a decrease in activity level because of a fear of falling?  No   Is the patient reluctant to leave their home because of a fear of falling?  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   Overall Cognitive Status --  pt reports that he has had a decline in cognition   Ambulation/Gait   Ambulation/Gait Yes   Ambulation/Gait Assistance 5: Supervision  assistance to steady with DGI components   Ambulation Distance (Feet) 200 Feet   Assistive device None   Gait Pattern Step-through pattern;Trendelenburg  unsteady, doesn't maintain straight path   Ambulation Surface Level;Indoor   Stairs Yes   Stairs Assistance 5: Supervision   Stair Management Technique Two rails;Alternating pattern  toes catch on step when ascending, unsteady   Number of Stairs 4   Height of Stairs 6   Standardized Balance Assessment   Standardized Balance Assessment Dynamic Gait Index;Timed Up and Go Test   Dynamic Gait Index   Level Surface Mild Impairment   Change in Gait Speed Severe Impairment   Gait with Horizontal Head Turns Moderate Impairment   Gait with Vertical Head Turns Mild Impairment   Gait and Pivot Turn Normal   Step Over Obstacle Normal   Step Around Obstacles Normal   Steps Mild Impairment   Total Score 16   Timed Up and Go Test   TUG Normal TUG   Normal TUG (seconds) 10.56   TUG Comments dizzy during turn   High Level Balance   High Level Balance Comments standing on airex with feet 6" apart:   Eyes open 30 seconds supervision; closed 4 seconds LOB            Vestibular Assessment - 12/09/14 1633    Occulomotor Exam   Comment Convergence  R eye delay, pt reports difficulty focusing   Vestibulo-Occular Reflex   VOR 1 Head Only (x 1 viewing) horizontal - WNL; vertical impaired with retinal slip and "jumping of target"                         PT Short Term Goals - 12/09/14 1648    PT SHORT TERM GOAL #1   Title Demonstrate correct performance of HEP. Target: 01/10/15   PT SHORT TERM GOAL #2   Title Negative with positional testing for BPPV with pt also reporting no vertigo with bed mobility x1 week. Target: 01/10/15   PT  SHORT TERM GOAL #3   Title Improve score on DGI to 18/24 for improving balance. Target: 01/10/15   PT SHORT TERM GOAL #4   Title Demonstrate ability to maintain balance on compliant surface (airex) with feet 6" apart and eyes closed x 15  seconds with supervision and no loss of balance for improved function of vestibular system for balance. Target: 01/10/15           PT Long Term Goals - Jan 06, 2015 1651    PT LONG TERM GOAL #1   Title Verbalize understanding of fall prevention strategies in home environment. Target: 02/07/15   PT LONG TERM GOAL #2   Title Demonstrate ability to ambulate 300' on outdoor uneven surfaces independently for decreased fall risk. Target: 02/07/15   PT LONG TERM GOAL #3   Title Perform TUG test in 10.56 seconds without reports of dizziness with turn. Target: 02/07/15   PT LONG TERM GOAL #4   Title Ascend curb without loss of balance with supervision for improved home access. Target 02/07/15   PT LONG TERM GOAL #5   Title Negotiiate stairs with single hand rail and MOD I without catching feet on steps for decreased risk of falls on stairs. Target: 02/07/15   Additional Long Term Goals   Additional Long Term Goals Yes   PT LONG TERM GOAL #6   Title Increase DGI score to 21/24 for decreased risk of falls. Target 02/07/15               Plan - 01-06-2015 1638    Clinical Impression Statement Pt is an 81 year old male with recurrent falls. Upon evaluation, pt presents with decreased function of the vestibular system for balance, and his reported history is indicative of possible BPPV-will test for this next visit. Pt will benefir from skilled PT services to decrase fall risk and to improve balance ptrio to his upcoming heart surgery.    Pt will benefit from skilled therapeutic intervention in order to improve on the following deficits Abnormal gait;Decreased balance   Rehab Potential Good   Clinical Impairments Affecting Rehab Potential cardiopulmonary status--check  vitals PRN   PT Frequency 2x / week   PT Duration 8 weeks   PT Treatment/Interventions ADLs/Self Care Home Management;Patient/family education;Therapeutic activities;Therapeutic exercise;Gait training;Balance training;Stair training;Neuromuscular re-education;Other (comment)  canalith repositioning as needed   PT Next Visit Plan Assess for BPPV and treat accordingly, HEP to include gaze x1, convergence, walking with head turns, balance on pillows with eyes  closed   Consulted and Agree with Plan of Care Patient          G-Codes - Jan 06, 2015 1644    Functional Assessment Tool Used DGI 16/24   Functional Limitation Mobility: Walking and moving around   Mobility: Walking and Moving Around Current Status [phone]) At least 20 percent but less than 40 percent impaired, limited or restricted   Mobility: Walking and Moving Around Goal Status [phone]) At least 1 percent but less than 20 percent impaired, limited or restricted       Problem List Patient Active Problem List   Diagnosis Date Noted  . Ataxia 12/05/2014  . Abnormality of gait 12/05/2014  . Neck pain, chronic 12/05/2014  . Brisk deep tendon reflexes 12/05/2014  . Abnormal carotid duplex scan 10/28/2014  . OSA (obstructive sleep apnea) 10/28/2014  . Mood disorder with psychosis 10/28/2014  . Episodes of formed visual hallucinations 10/16/2014  . Faintness   . Syncope 10/15/2014  . Acute on chronic renal failure 10/15/2014  . Right fibular fracture 10/15/2014  . Hypoxia 10/15/2014  . Syncope and collapse 10/15/2014  . CAD (coronary artery disease), native coronary artery 09/29/2014  . Falls frequently 09/29/2014  . Hypertension   . Hypercholesteremia   . Aortic valve insufficiency   .  Depression   . Atrial fibrillation, chronic 06/20/2014  . Hypothyroidism 06/20/2014  . DOE (dyspnea on exertion) 06/20/2014  . Cough 04/29/2014  . Shortness of breath 04/29/2014   Lamar LaundryJennifer Xavion Muscat, PT,DPT,NCS 12/09/2014 4:57 PM Phone  [phone] FAX [phone]         [facility] [address] [place], [place], [address] Phone: [phone]   Fax:  [phone]

## 2014-12-10 ENCOUNTER — Other Ambulatory Visit: Payer: Self-pay | Admitting: Neurology

## 2014-12-10 DIAGNOSIS — R270 Ataxia, unspecified: Secondary | ICD-10-CM

## 2014-12-10 DIAGNOSIS — R269 Unspecified abnormalities of gait and mobility: Secondary | ICD-10-CM

## 2014-12-10 DIAGNOSIS — M542 Cervicalgia: Secondary | ICD-10-CM

## 2014-12-10 DIAGNOSIS — G8929 Other chronic pain: Secondary | ICD-10-CM

## 2014-12-10 DIAGNOSIS — R292 Abnormal reflex: Secondary | ICD-10-CM

## 2014-12-18 ENCOUNTER — Ambulatory Visit: Payer: Self-pay | Admitting: Cardiothoracic Surgery

## 2014-12-23 ENCOUNTER — Ambulatory Visit
Admission: RE | Admit: 2014-12-23 | Discharge: 2014-12-23 | Disposition: A | Discharge status: Home / Self Care | Payer: Medicare Other | Source: Ambulatory Visit | Attending: Neurology | Admitting: Neurology

## 2014-12-23 ENCOUNTER — Telehealth: Payer: Self-pay | Admitting: Neurology

## 2014-12-23 DIAGNOSIS — G8929 Other chronic pain: Secondary | ICD-10-CM

## 2014-12-23 DIAGNOSIS — R269 Unspecified abnormalities of gait and mobility: Secondary | ICD-10-CM

## 2014-12-23 DIAGNOSIS — F332 Major depressive disorder, recurrent severe without psychotic features: Secondary | ICD-10-CM | POA: Diagnosis not present

## 2014-12-23 DIAGNOSIS — M542 Cervicalgia: Secondary | ICD-10-CM

## 2014-12-23 DIAGNOSIS — R292 Abnormal reflex: Secondary | ICD-10-CM

## 2014-12-23 DIAGNOSIS — R270 Ataxia, unspecified: Secondary | ICD-10-CM

## 2014-12-23 DIAGNOSIS — F39 Unspecified mood [affective] disorder: Secondary | ICD-10-CM | POA: Diagnosis not present

## 2014-12-23 DIAGNOSIS — Z0189 Encounter for other specified special examinations: Secondary | ICD-10-CM | POA: Diagnosis not present

## 2014-12-23 DIAGNOSIS — F411 Generalized anxiety disorder: Secondary | ICD-10-CM | POA: Diagnosis not present

## 2014-12-23 NOTE — Telephone Encounter (Signed)
FYI-Christy with [facility] @ [phone], stated patient had a brain clip placed in 1960 and wasn't able to proceed with MRI scan.

## 2014-12-23 NOTE — Telephone Encounter (Signed)
He has already had a CT of his head and neck. Let him know we can talk about it at his next appointment. But we can't get an MRI. thanks

## 2014-12-24 NOTE — Telephone Encounter (Signed)
Talked with patient and told him Dr. Lucia GaskinsAhern is aware he could not get MRI done and okay to see him at his next appointment. Pt verbalized understanding.

## 2015-01-02 ENCOUNTER — Ambulatory Visit (INDEPENDENT_AMBULATORY_CARE_PROVIDER_SITE_OTHER): Payer: Medicare Other | Admitting: Cardiothoracic Surgery

## 2015-01-02 ENCOUNTER — Encounter: Payer: Self-pay | Admitting: Cardiothoracic Surgery

## 2015-01-02 VITALS — BP 126/90 | HR 86 | Resp 20 | Ht 72.0 in | Wt 254.0 lb

## 2015-01-02 DIAGNOSIS — I351 Nonrheumatic aortic (valve) insufficiency: Secondary | ICD-10-CM

## 2015-01-02 DIAGNOSIS — I5032 Chronic diastolic (congestive) heart failure: Secondary | ICD-10-CM | POA: Diagnosis not present

## 2015-01-02 DIAGNOSIS — I25119 Atherosclerotic heart disease of native coronary artery with unspecified angina pectoris: Secondary | ICD-10-CM

## 2015-01-02 DIAGNOSIS — I4891 Unspecified atrial fibrillation: Secondary | ICD-10-CM

## 2015-01-02 NOTE — Progress Notes (Signed)
[address]       [place] [address]             [phone]                    [name] Sanay [facility] Medical Record #[id number] Date of Birth: 10/28/1931  Referring: Oralia Manis,* Primary Care: Cain Saupe, MD  Chief Complaint:    Chief Complaint  Patient presents with  . Coronary Artery Disease    further discuss surgery and schedule date  . Aortic Insuffiency  . Atrial Fibrillation    History of Present Illness:    [name] [name] 81 y.o. male is seen in the office  concerning aortic valve replacement and coronary artery bypass grafting.He has been followed at [facility] for many years with  aortic valve insufficiency atrial fibrillation and hypertension. The patient was  hospitalized at [facility] in January with increasing dyspnea on exertion and difficulty carrying objects and caring for his usual activities. He  underwent further evaluation for consideration of surgery including cardiac cath CT scan of the chest and echocardiogram. At that time it was recommended to him that he needed an aortic valve replacement and coronary artery bypass but was not felt to be strong enough and was discharge to a nursing home. He saw me 4 weeks ago for  opinion concerning   cardiac surgery . At that time he was still very weak and not independent .    The patient comes into the office today on his own having driven to the office alone. He notes that he's moving residents today and has been actively doing this this morning. Overall marked improvement from the first visit [place] on. He has seen neurology , he was not able to have MRI because of metal in his brain.  He comes in today with his sone to discuss surgery   Current Activity/ Functional Status:  Patient is  independent with mobility/ambulation, transfers, ADL's, IADL's. . His son notes that he has been walking has markedly improved.  He denies chest pain   Note from [facility]. [name] presented for  pre-hydration prior to RHC/LHC. TTE on 09/18/2014 showed an LVEF 50%, LVIDs 4.4 cm, severe AR. RHC/LHC on 09/20/2014 showed a RA 11 RV 50/13 PA 47/25 (33) PCWP 18 CI 1.6 PVR 4.0 and a 70% distal RCA lesion. After discussion among the heart team, given his frailty, the decision was made to send him to a SNF given his frailty and have him re-evaluated in clinic by Dr. [name] for CABG+AVR. He would likely be a poor TAVR candidate given the absence of Ca in his AoV annulus (CT Chest obtained while in house).   [name] Score: At the time of surgery this patient's most appropriate activity status/level should be described as:     0    Normal activity, no symptoms     1    Restricted in physical strenuous activity but ambulatory, able to do out light work     2    Ambulatory and capable of self care, unable to do work activities, up and about               >50 % of waking hours                                  3    Only limited self  care, in bed greater than 50% of waking hours []     4    Completely disabled, no self care, confined to bed or chair []     5    Moribund   Past Medical History  Diagnosis Date  . Atrial fibrillation   . Hypothyroidism   . Plantar fasciitis   . Seasonal allergies   . Decreased testosterone level   . Varicose veins   . Hypertension   . Hypercholesteremia   . Aortic valve insufficiency   . Depression   . Heart murmur   . DVT (deep venous thrombosis) ~ 2013    "BLE"  . OSA (obstructive sleep apnea)     "suppose to wear mask; I throw it off in my sleep" (10/15/2014)  . COPD (chronic obstructive pulmonary disease)   . Emphysema of lung   . History of blood transfusion 1960    "lots; related to an accident"  . Arthritis     "wee bit; knees, elbows" (10/15/2014)  . Anxiety   . Chronic kidney disease     "down to ~ 1/2 of their regular use; I see kidney dr. in [place]" (10/15/2014)  . Falls frequently     > 20 times in the last year/notes 10/15/2014  . Syncope  and collapse "several times"    Past Surgical History  Procedure Laterality Date  . Abdominal aortic aneurysm repair  01/2006; 03/10/2006    Hattie Perch/notes 01/12/2011  . Tonsillectomy    . Hernia repair    . Laparoscopic incisional / umbilical / ventral hernia repair  02/2010    VHR w/mesh/notes 03/28/2010  . Laparoscopic cholecystectomy  08/2009    w/IOC/notes 09/18/2009  . Ercp  11/2009    Hattie Perch/notes 12/05/2009  . Cardiac catheterization  1990's X 1; 09/2014  . Brain surgery  1960 X 4    "S/P got my skull busted"  . Gall stone    previous head injury 1960 to hospitalize 93 days  Family History  Problem Relation Age of Onset  . Heart disease Father   . Rheum arthritis Mother   . Rheum arthritis Father   . Cancer Sister   . Stroke Sister     History   Social History  . Marital Status: Married    Spouse Name: Claris CheMargaret  . Number of Children: 1  . Years of Education: Master's   Occupational History  . Not on file.   Social History Main Topics  . Smoking status: Former Smoker -- 2.00 packs/day for 22 years    Types: Cigarettes    Quit date: 04/29/1974  . Smokeless tobacco: Never Used  . Alcohol Use: 0.0 oz/week    0 Standard drinks or equivalent per week     Comment: 1 beer per year   . Drug Use: No  . Sexual Activity: Not Currently   Other Topics Concern  . Not on file   Social History Narrative   Lives at home with wife.   Right handed.   Caffeine use: 3 cups coffee per day.   Drinks 4 cups tea per day   Drinks soda very rarely     History  Smoking status  . Former Smoker -- 2.00 packs/day for 22 years  . Types: Cigarettes  . Quit date: 04/29/1974  Smokeless tobacco  . Never Used    History  Alcohol Use  . 0.0 oz/week  . 0 Standard drinks or equivalent per week    Comment: 1 beer per year  No Known Allergies  Current Outpatient Prescriptions  Medication Sig Dispense Refill  . acetaminophen (TYLENOL) 325 MG tablet Take 2 tablets (650 mg total) by mouth  every 6 (six) hours as needed for mild pain (or Fever >/= 101).    Marland Kitchen albuterol (PROVENTIL HFA;VENTOLIN HFA) 108 (90 BASE) MCG/ACT inhaler Inhale 2 puffs into the lungs every 6 (six) hours as needed for wheezing or shortness of breath.    Marland Kitchen amLODipine (NORVASC) 5 MG tablet Take 5 mg by mouth daily.    Marland Kitchen apixaban (ELIQUIS) 2.5 MG TABS tablet Take 1 tablet (2.5 mg total) by mouth 2 (two) times daily. 60 tablet 3  . atorvastatin (LIPITOR) 10 MG tablet Take 10 mg by mouth daily at 6 PM.    . buPROPion (WELLBUTRIN XL) 300 MG 24 hr tablet Take 300 mg by mouth daily.    . busPIRone (BUSPAR) 15 MG tablet Take 15 mg by mouth 2 (two) times daily.    . calcium carbonate (TUMS - DOSED IN MG ELEMENTAL CALCIUM) 500 MG chewable tablet Chew 1 tablet by mouth as needed for indigestion or heartburn.    . clonazePAM (KLONOPIN) 0.5 MG tablet Take 1 tablet (0.5 mg total) by mouth at bedtime as needed for anxiety. 30 tablet 0  . digoxin (LANOXIN) 0.125 MG tablet Take 0.125 mg by mouth daily.    Marland Kitchen diltiazem (TIAZAC) 120 MG 24 hr capsule Take 120 mg by mouth daily.    Marland Kitchen dutasteride (AVODART) 0.5 MG capsule Take 0.5 mg by mouth daily.    Marland Kitchen escitalopram (LEXAPRO) 20 MG tablet Take 20 mg by mouth daily.    . Eszopiclone (ESZOPICLONE) 3 MG TABS Take 3 mg by mouth at bedtime. Take immediately before bedtime    . fluticasone (FLONASE) 50 MCG/ACT nasal spray Place 1 spray into both nostrils daily. 9.9 g 2  . folic acid (FOLVITE) 1 MG tablet Take 1 mg by mouth daily.    Marland Kitchen lamoTRIgine (LAMICTAL) 100 MG tablet Take 100 mg by mouth 2 (two) times daily. Breakfast and dinner    . levothyroxine (SYNTHROID, LEVOTHROID) 150 MCG tablet Take 150 mcg by mouth daily before breakfast.    . Melatonin 3 MG TABS Take 1 tablet by mouth at bedtime.     No current facility-administered medications for this visit.      Review of Systems:     Cardiac Review of Systems: Y or N  Chest Pain [ n   ]  Resting SOB [ y  ] Exertional SOB  [ y ]    Orthopnea [place].Etienne  ]   Pedal Edema [ y  ]    Palpitations [ y ] Syncope  [ n ]   Presyncope [ y  ]  General Review of Systems: [Y] = yes [  ]=no Constitional: recent weight change [ y ];  Wt loss over the last 3 months [   ] anorexia [  ]; fatigue [  ]; nausea [  ]; night sweats [  ]; fever [  ]; or chills [  ];          Dental: poor dentition[  ]; Last Dentist visit:   Eye : blurred vision [  ]; diplopia [   ]; vision changes [  ];  Amaurosis fugax[  ]; Resp: cough [ y ];  wheezingy[  ];  hemoptysis[n  ]; shortness of breath[ y ]; paroxysmal nocturnal dyspnea[ y ]; dyspnea on exertion[  y]; or orthopnea[ y ];  GI:  gallstones[  ], vomiting[  ];  dysphagia[  ]; melena[  ];  hematochezia [  ]; heartburn[  ];   Hx of  Colonoscopy[y  ]; GU: kidney stones [  ]; hematuria[  ];   dysuria [  ];  nocturia[  ];  history of     obstruction [  ]; urinary frequency [  ]             Skin: rash, swelling[  ];, hair loss[  ];  peripheral edema[  ];  or itching[  ]; Musculosketetal: myalgias[  ];  joint swelling[  ];  joint erythema[  ];  joint pain[  ];  back pain[  ];  Heme/Lymph: bruising[ y ];  bleeding[ n ];  anemia[ n ];  Neuro: TIA[  ];  headaches[  ];  stroke[n  ];  vertigo[ y ];  seizures[n  ];   paresthesias[  ];  difficulty walking[improved  ];  Psych:depression[y  ]; Kendell Bane  ];  Endocrine: diabetes[n  ];  thyroid dysfunction[ y ];  Immunizations: Flu up to date [ ? ]; Pneumococcal up to date [?  ];  Other:  Physical Exam: BP 126/90 mmHg  Pulse 86  Resp 20  Ht 6' (1.829 m)  Wt 254 lb (115.214 kg)  BMI 34.44 kg/m2  SpO2 95%  PHYSICAL EXAMINATION: General appearance: alert, cooperative and patient is very alert today  Head: Normocephalic, without obvious abnormality, atraumatic Neck: no adenopathy, no carotid bruit, no JVD, supple, symmetrical, trachea midline, thyroid not enlarged, symmetric, no tenderness/mass/nodules and scar from left carotid surgery Lymph nodes: Cervical, supraclavicular,  and axillary nodes normal. Resp: clear to auscultation bilaterally Back: symmetric, no curvature. ROM normal. No CVA tenderness. Cardio: irregularly irregular rhythm GI: soft, non-tender; bowel sounds normal; no masses,  no organomegaly Extremities: edema mild bilaterial edema Neurologic: Mental status: Alert, oriented, thought content appropriate, alertness: alert, mildly confused about some events and clear about others Coordination:  walks  Gait:Patient's independently ambulatory in and out of the office improved from his previous visit   Diagnostic Studies & Laboratory data:     Recent Radiology Findings:      [facility] at [facility]: Report only Chest CT without contrast  Indication: I48.2 Chronic atrial fibrillation, I71.4 Abdominal aortic aneurysm, without rupture, preop  Comparison: March 27, 2012  Protocol:  Volumetric non-contrast chest CT was performed from the lower neck through the adrenal glands.  This study was acquired without intravenous contrast given the patients indications for the examination. 2D multiplanar reconstruction including sagittal and coronal reconstruction was utilized to enhance assessment of the vasculature including sagittal and coronal reconstructed images. 5 x 5 mm axial maximum intensity projection images (MIPS) were reconstructed to facilitate lung nodule detection.  Findings: Vasculature The ascending thoracic aorta measures up to 4.0 cm. There are moderate mural calcifications. Fat planes between the descending aorta, pulmonary artery and right ventricle and the sternum are preserved. The right ventricle is 2.1 cm posterior to the xiphoid. Brachiocephalic vein is 2 cm posterior to the sternal cortex. The ascending aorta is 4.3 cm posterior to the sternal cortex. Moderate coronary artery calcifications.  Non vascular structures: Debris is seen within the mid trachea. The central airways are patent. Again seen is linear opacities in the  right lower lobe with mild bronchiectasis with elevation the right hemidiaphragm, likely related to scarring. Mild centrilobular emphysema. Unchanged 4 mm right middle lobe pulmonary nodule (image 248, series 4). Unchanged 4 mm right lower lobe pulmonary  nodule abutting the accessory fissure (image 261, series 4) . Unchanged 4 mm subpleural right lower lobe pulmonary nodule (image 298, series 4). Unchanged 5 mm right lower lobe pulmonary nodule (image 186, series 4). No pleural effusion.  Neck base is normal.  Mild cardiomegaly. No pericardial effusion. No mediastinal, hilar, or axillary lymphadenopathy.  No suspicious lytic or sclerotic osseous lesions. Multilevel degenerative changes of the thoracolumbar spine.  Small hiatal hernia. The patient is status post cholecystectomy. Surgical clips are seen in the gallbladder fossa. Limited images of the upper abdomen are unremarkable.  Impression: 1.Sternotomy measurements as described above. 2.Coronary artery calcifications. 3.Stable pulmonary nodules.   Electronically Reviewed by:  Larry Sierras, MD Electronically Reviewed on:  09/19/2014 11:20 AM  I have reviewed the images and concur with the above findings.  Electronically Signed by:  Beckie Salts, MD Electronically Signed on:  09/20/2014 12:06 PM  ECHO:  [facility]            [place], [place]                                                      [id number]    DOB: 12-23-1931                CARDIAC DIAGNOSTIC UNIT               Date: 09/18/2014 16:00:00                                                      Adult     Male   Age: 13                  ECHO-DOPPLER REPORT                 Inpatient                                                      [id number] ---------------------------------------------------- MD1: Ward, Lajoyce Corners     STUDY: Chest Wall          TAPE: 0000:00:0:00:00 BP: 143/66      ECHO: Yes   DOPPLER: Yes  FILE: [id number]:   HR: 98      COLOR: Yes  CONTRAST: No      MACHINE: [id number] #[id number]  Height: 72 in RV BIOPSY: No         3D: No   SOUND QLTY: Moderate  Weight: 259 lbs    MEDIUM: None                                      BSA: 2.44,  BMI: 35.10 ------------------------------------------------------------------------------    HISTORY:  Arrhythmia and Valvular disease     REASON: Assess Aortic Regurgitation INDICATION: I48.2 - Chronic atrial fibrillation. I35.1 - Nonrheumatic aortic             (valve) insufficiency. Q25.3 - Supravalvular aortic  stenosis.             I48.91 - Unspecified atrial fibrillation.   ECHOCARDIOGRAPHIC MEASUREMENTS ----------------------------------------------- 2D DIMENSIONS AORTA          Values   Normal Range       MAIN PA      Values   Normal Range      Annulus:  nm*  cm  [2.3 - 2.9]           PA Main:  nm*  cm  [1.5 - 2.1]    Aorta Sin:   3.8 cm  [3.1 - 3.7]        RIGHT VENTRICLE  ST Junction:  nm*  cm  [2.6 - 3.2]           RV Base:  nm*  cm  [<= 4.2]    Asc.Aorta:   4.0 cm  [2.6 - 3.4]            RV Mid:  nm*  cm  [<= 3.5] LEFT VENTRICLE                              RV Length:  nm*  cm  [<= 8.6]        LVIDd:   6.0 cm  [4.2 - 5.9]        INFERIOR VENA CAVA        LVIDs:   4.4 cm                        Max.IVC:  nm*  cm  [<= 2.1]           FS:  27%      [> 25%]               Min.IVC:  nm*  cm  [<= 1.7]          SWT:   1.1 cm  [0.6 - 1.0]        __________________          PWT:   1.0 cm  [0.6 - 1.0]        nm* - not measured LEFT ATRIUM      LA Diam:   5.9 cm  [3.0 - 4.0]      LA Area:  39   cm2 [< 20]    LA Volume: 151   mL  [18 - 58]  ECHOCARDIOGRAPHIC DESCRIPTIONS ----------------------------------------------- AORTIC ROOT         Size: DILATED   Dissection: INDETERM FOR DISSECTION  AORTIC VALVE     Leaflets: Tricuspid             Morphology: MILDLY THICKENED     Mobility: Fully Mobile  LEFT VENTRICLE                                      Anterior: Normal         Size: Normal                                  Lateral: Normal  Contraction: REGIONALLY IMPAIRED                     Septal: HYPOCONTRACTILE   Closest EF: 50%  (Estimated)  Apical: Normal    LV masses: No Masses                             Inferior: Normal          LVH: None                                 Posterior: Normal Dias.FxClass: Normal  MITRAL VALVE     Leaflets: Normal                  Mobility: Fully mobile   Morphology: ANNULAR CALC  LEFT ATRIUM         Size: MODERATELY ENLARGED    LA masses: No masses               Normal IAS  MAIN PA         Size: Normal  PULMONIC VALVE   Morphology: Normal     Mobility: Fully Mobile  RIGHT VENTRICLE         Size: Normal                    Free wall: Normal  Contraction: Normal                    RV masses: No Masses        TAPSE:   2.6 cm,  Normal Range [>= 1.6 cm]  TRICUSPID VALVE     Leaflets: Normal                  Mobility: Fully mobile   Morphology: Normal  RIGHT ATRIUM         Size: Normal                     RA Other: None    RA masses: No masses  PERICARDIUM        Fluid: No effusion  INFERIOR VENACAVA         Size: Normal     Normal respiratory collapse  DOPPLER ECHO and OTHER SPECIAL PROCEDURES ------------------------------------    Aortic: SEVERE AR              No AS     Mitral: No MR                  No MS    MV Inflow E Vel.= nm* cm/s  MV Annulus E'Vel.= nm* cm/s  E/E'Ratio= nm*  Tricuspid: TRIVIAL TR             No TS  Pulmonary: No PR                  No PS      Other:  INTERPRETATION ---------------------------------------------------------------   MILD LV DYSFUNCTION (See above)   NORMAL LA PRESSURES WITH NORMAL DIASTOLIC FUNCTION   NORMAL RIGHT VENTRICULAR SYSTOLIC FUNCTION   VALVULAR REGURGITATION: SEVERE AR, TRIVIAL TR   NO VALVULAR STENOSIS   Compared with prior Echo study on 11/13/2013: LEFT VENTRICULAR DIMENSIONS   ARE INCREASED; ASCENDING AORTA MEASURES LARGER BUT IS CONSISTENT  WITH   MEASUREMENT FROM 03/17/2012 STUDY.   (Report version 3.0)                    Interpreted and Electronically signed  Perform. by: Percell Locus, RDCS  by: Earlie LouSvati Hasmukh Shah, MD  Resp.Person: Percell Locusshlee Davis, RDCS                  On: 09/18/2014 17:18:19  Recent Lab Findings: Lab Results  Component Value Date   WBC 5.7 10/17/2014   HGB 11.1* 10/17/2014   HCT 33.2* 10/17/2014   PLT 175 10/17/2014   GLUCOSE 99 10/18/2014   ALT 18 06/20/2014   AST 21 06/20/2014   NA 141 10/18/2014   K 3.4* 10/18/2014   CL 106 10/18/2014   CREATININE 1.61* 10/18/2014   BUN 10 10/18/2014   CO2 30 10/18/2014   TSH 3.763 10/15/2014   INR 1.17 06/20/2014   Chronic Kidney Disease   Stage I     GFR >90  Stage II    GFR 60-89  Stage IIIA GFR 45-59  Stage IIIB GFR 30-44  Stage IV   GFR 15-29  Stage V    GFR  <15  Cardiac Cath films at [facility]  In [facility] system- has disease in right coronary 60-70 %   Assessment / Plan:   1/ Acute on chronic diastolic Heart failure, with preserved EF likely from symptomatic AI Patient appears euvolemic on exam today On  lasix  And linopril   2/Afib: rate controled and on eloquis - EKG, tele On  eloquis   3/CKD: patient baseline seems to be around 1.6, currently at baseline   4/depression/anxiety/insomnia  on lexapro, lamictal, lunesta   5/ HTN , HL  Treated  6/ hypothyroidism: TSH is  3.7      on synthroid     7/ stable pulmonary nodules per report from [facility]  I have seen the patient and reviewed his chart including cardiac cath and echo films from [facility]. Since first seen 10 weeks ago weeks the patient has has made very significant improvement his overall functional status . He is now independent growth to the office on his own today. He had appointment for neurology evaluation early yesterday,.   I have reviewed the patient's information studies from [facility] with Dr. Excell Seltzerooper in regard to being a possible TAVER  candidate. Because of the  patient's primary aortic insufficiency and very little calcium in the aortic root Or would not be technically suitable. I have discussed with the patient further open aortic valve replacement and coronary artery bypass grafting in consideration of atrial clipping  procedure. He is willing to proceed but would like to wait till the middle of the summer because of issues with his wife. I discouraged this space to considering the improvement he has made over the past 5 weeks and his rehabilitation. He comes intos with his sone. Risks and options of surgery are discussed. STS risk 22% combined death and morbidity .  Patient will discuss with his wife timing of surgery, I have offered him May 16. He will call back. We will then d/c Eliquis 5 days prio to surgery.  Delight OvensEdward B Yer Olivencia MD      [address] [place] [id number] Office [phone]   Beeper [phone]  01/02/2015 4:34 PM

## 2015-01-06 ENCOUNTER — Ambulatory Visit: Payer: Medicare Other | Attending: Family Medicine

## 2015-01-06 DIAGNOSIS — R262 Difficulty in walking, not elsewhere classified: Secondary | ICD-10-CM | POA: Insufficient documentation

## 2015-01-06 DIAGNOSIS — R279 Unspecified lack of coordination: Secondary | ICD-10-CM | POA: Insufficient documentation

## 2015-01-06 DIAGNOSIS — R269 Unspecified abnormalities of gait and mobility: Secondary | ICD-10-CM | POA: Insufficient documentation

## 2015-01-06 DIAGNOSIS — R2689 Other abnormalities of gait and mobility: Secondary | ICD-10-CM | POA: Diagnosis not present

## 2015-01-06 NOTE — Patient Instructions (Signed)
Oculomotor: Saccades (horizontal; and right up/ left down   Holding tyour two index fingers positioned side by side 12 inches apart, move eyes quickly from finger to finger as head stays still focus on the finger 2 seconds before looking at the other. Do this 20x, then hold fingers at a diagonal with right hand up and left hand down and move eyes as before 20x. Perform sitting. 3x per day.   Eye Tracking: Convergence   Hold the green band with the yellow knot close you you about 6-8" from your nose and the red knot farther out from you. Focus both eyes on the yellow knot for 2 seconds, then look at the red knot for 2 seconds. Perform this 20x, 3 sets per day.    Turning   Tilt head down 15-30, lead with head and eyes and slowly make quarter turn to left with eyes open. Hold position until symptoms subside. Perform for a total of 3 full turns to the left and 3 full turns to the right every day.    Walking Head Turn   Standing close to a counter, with one fingertip on counter, walk along the counter 3 laps, while turning your head right and left.

## 2015-01-06 NOTE — Therapy (Signed)
[facility] [address] [place], [place], [address] Phone: [phone]   Fax:  [phone]  Physical Therapy Treatment  Patient Details  Name: [name] [name] MRN: [id number] Date of Birth: August 25, 1932 Referring Provider:  Cain SaupeFulp, Cammie, MD  Encounter Date: 01/06/2015      PT End of Session - 01/06/15 1638    Visit Number 2   Number of Visits 17   Date for PT Re-Evaluation 02/07/15   Authorization Type Medicare-G codes required every 10th visit   PT Start Time 1400   PT Stop Time 1447   PT Time Calculation (min) 47 min      Past Medical History  Diagnosis Date  . Atrial fibrillation   . Hypothyroidism   . Plantar fasciitis   . Seasonal allergies   . Decreased testosterone level   . Varicose veins   . Hypertension   . Hypercholesteremia   . Aortic valve insufficiency   . Depression   . Heart murmur   . DVT (deep venous thrombosis) ~ 2013    "BLE"  . OSA (obstructive sleep apnea)     "suppose to wear mask; I throw it off in my sleep" (10/15/2014)  . COPD (chronic obstructive pulmonary disease)   . Emphysema of lung   . History of blood transfusion 1960    "lots; related to an accident"  . Arthritis     "wee bit; knees, elbows" (10/15/2014)  . Anxiety   . Chronic kidney disease     "down to ~ 1/2 of their regular use; [place] dr. in [place]" (10/15/2014)  . Falls frequently     > 20 times in the last year/notes 10/15/2014  . Syncope and collapse "several times"    Past Surgical History  Procedure Laterality Date  . Abdominal aortic aneurysm repair  01/2006; 03/10/2006    Hattie Perch/notes 01/12/2011  . Tonsillectomy    . Hernia repair    . Laparoscopic incisional / umbilical / ventral hernia repair  02/2010    VHR w/mesh/notes 03/28/2010  . Laparoscopic cholecystectomy  08/2009    w/IOC/notes 09/18/2009  . Ercp  11/2009    Hattie Perch/notes 12/05/2009  . Cardiac catheterization  1990's X 1; 09/2014  . Brain surgery   1960 X 4    "S/P got my skull busted"  . Gall stone      There were no vitals filed for this visit.  Visit Diagnosis:  Lack of coordination  Imbalance      Subjective Assessment - 01/06/15 1402    Subjective Pt believes his valve replacement surgery and CABG will be scheduled for mid June per pt report.    Currently in Pain? No/denies      See assessment section for all assessments completed today. Also, pt appears to have difficulty with gaze stabilization but denies any symptoms while performing horizontal or vertical gaze x1 and has difficulty coordinating or understanding how to perform this as an exercise. Will be treating gaze with other exercises.  See clinical impression statement for all exercises taught, performed and provided today. Extra time required to teach and explain the purpose of each exercise today.                            PT Education - 01/06/15 1642    Education provided Yes   Education Details HEP   Person(s) Educated Patient   Methods Handout;Explanation;Demonstration;Verbal cues   Comprehension Verbalized  understanding;Returned demonstration          PT Short Term Goals - 12/09/14 1648    PT SHORT TERM GOAL #1   Title Demonstrate correct performance of HEP. Target: 01/10/15   PT SHORT TERM GOAL #2   Title Negative with positional testing for BPPV with pt also reporting no vertigo with bed mobility x1 week. Target: 01/10/15   PT SHORT TERM GOAL #3   Title Improve score on DGI to 18/24 for improving balance. Target: 01/10/15   PT SHORT TERM GOAL #4   Title Demonstrate ability to maintain balance on compliant surface (airex) with feet 6" apart and eyes closed x 15 seconds with supervision and no loss of balance for improved function of vestibular system for balance. Target: 01/10/15           PT Long Term Goals - 12/09/14 1651    PT LONG TERM GOAL #1   Title Verbalize understanding of fall prevention strategies in home  environment. Target: 02/07/15   PT LONG TERM GOAL #2   Title Demonstrate ability to ambulate 300' on outdoor uneven surfaces independently for decreased fall risk. Target: 02/07/15   PT LONG TERM GOAL #3   Title Perform TUG test in 10.56 seconds without reports of dizziness with turn. Target: 02/07/15   PT LONG TERM GOAL #4   Title Ascend curb without loss of balance with supervision for improved home access. Target 02/07/15   PT LONG TERM GOAL #5   Title Negotiiate stairs with single hand rail and MOD I without catching feet on steps for decreased risk of falls on stairs. Target: 02/07/15   Additional Long Term Goals   Additional Long Term Goals Yes   PT LONG TERM GOAL #6   Title Increase DGI score to 21/24 for decreased risk of falls. Target 02/07/15               Plan - 01/06/15 1639    Clinical Impression Statement Pt continues to report dizziness with bed mobility but testing for BPPV was negative today and pt didn't have dizziness with bed mobility during session. This may be due to treatment at later time of day with decreased sensitivity of inner ear to stimulation. Pt has impaired convergence, saccades, and blurring of vision while walking with head turns and while turning. Exercises provided to address these impairments.    PT Next Visit Plan Re-assess for BPPV and treat accordingly, add balance on pillows with eyes closed to HEP        Problem List Patient Active Problem List   Diagnosis Date Noted  . Ataxia 12/05/2014  . Abnormality of gait 12/05/2014  . Neck pain, chronic 12/05/2014  . Brisk deep tendon reflexes 12/05/2014  . Abnormal carotid duplex scan 10/28/2014  . OSA (obstructive sleep apnea) 10/28/2014  . Mood disorder with psychosis 10/28/2014  . Episodes of formed visual hallucinations 10/16/2014  . Faintness   . Syncope 10/15/2014  . Acute on chronic renal failure 10/15/2014  . Right fibular fracture 10/15/2014  . Hypoxia 10/15/2014  . Syncope and  collapse 10/15/2014  . CAD (coronary artery disease), native coronary artery 09/29/2014  . Falls frequently 09/29/2014  . Hypertension   . Hypercholesteremia   . Aortic valve insufficiency   . Depression   . Atrial fibrillation, chronic 06/20/2014  . Hypothyroidism 06/20/2014  . DOE (dyspnea on exertion) 06/20/2014  . Cough 04/29/2014  . Shortness of breath 04/29/2014   Lamar LaundryJennifer Tazaria Dlugosz, PT,DPT,NCS 01/06/2015 4:45 PM Phone [phone]  [employer] [phone]         [facility] [address] [place], [place], [address] Phone: [phone]   Fax:  [phone]

## 2015-01-08 ENCOUNTER — Ambulatory Visit: Payer: Medicare Other

## 2015-01-08 DIAGNOSIS — R279 Unspecified lack of coordination: Secondary | ICD-10-CM | POA: Diagnosis not present

## 2015-01-08 DIAGNOSIS — R2689 Other abnormalities of gait and mobility: Secondary | ICD-10-CM | POA: Diagnosis not present

## 2015-01-08 DIAGNOSIS — R269 Unspecified abnormalities of gait and mobility: Secondary | ICD-10-CM | POA: Diagnosis not present

## 2015-01-08 DIAGNOSIS — R262 Difficulty in walking, not elsewhere classified: Secondary | ICD-10-CM | POA: Diagnosis not present

## 2015-01-08 NOTE — Therapy (Signed)
[facility] [address] [place], [place], [address] Phone: [phone]   Fax:  [phone]  Physical Therapy Treatment  Patient Details  Name: [name] [name] MRN: [id number] Date of Birth: 19-Jan-1932 Referring Provider:  Cain SaupeFulp, Cammie, MD  Encounter Date: 01/08/2015      PT End of Session - 01/08/15 1443    Visit Number 3   Number of Visits 17   Date for PT Re-Evaluation 02/07/15   Authorization Type Medicare-G codes required every 10th visit   PT Start Time 1401   PT Stop Time 1443   PT Time Calculation (min) 42 min      Past Medical History  Diagnosis Date  . Atrial fibrillation   . Hypothyroidism   . Plantar fasciitis   . Seasonal allergies   . Decreased testosterone level   . Varicose veins   . Hypertension   . Hypercholesteremia   . Aortic valve insufficiency   . Depression   . Heart murmur   . DVT (deep venous thrombosis) ~ 2013    "BLE"  . OSA (obstructive sleep apnea)     "suppose to wear mask; I throw it off in my sleep" (10/15/2014)  . COPD (chronic obstructive pulmonary disease)   . Emphysema of lung   . History of blood transfusion 1960    "lots; related to an accident"  . Arthritis     "wee bit; knees, elbows" (10/15/2014)  . Anxiety   . Chronic kidney disease     "down to ~ 1/2 of their regular use; [place] dr[place]" (10/15/2014)  . Falls frequently     > 20 times in the last year/notes 10/15/2014  . Syncope and collapse "several times"    Past Surgical History  Procedure Laterality Date  . Abdominal aortic aneurysm repair  01/2006; 03/10/2006    Hattie Perch/notes 01/12/2011  . Tonsillectomy    . Hernia repair    . Laparoscopic incisional / umbilical / ventral hernia repair  02/2010    VHR w/mesh/notes 03/28/2010  . Laparoscopic cholecystectomy  08/2009    w/IOC/notes 09/18/2009  . Ercp  11/2009    Hattie Perch/notes 12/05/2009  . Cardiac catheterization  1990's X 1; 09/2014  . Brain  surgery  1960 X 4    "S/P got my skull busted"  . Gall stone      There were no vitals filed for this visit.  Visit Diagnosis:  Lack of coordination      Subjective Assessment - 01/08/15 1409    Subjective Pt reportd he did not have dizziness this morning   Currently in Pain? No/denies      Corner balance on two stacked pillows head turns all directions  10x each with eyes open with single fingertip support and eye sclosed with two fingetip support.  Then on stacked pillows without UE support, turning to look over shoulder an reaching first with ipsilateral UE then across midline  Standing on airex next to counter modified tandem with left foot forward static with intermittent UE support with close supervision, then with bean bag toss activity; then on compliant balance beam with full tandem with right foot forward with bean bag toss and MIN A with single UE support  Lateral walking on compliant balance beam without UE support with CGA x3 laps left and right, then tandem walking on compliant balance beam with single UE support and close supervision x3 laps forwrad/backward  Forward then lateral stepping over 2"x4" obstacles without  UE support with CGA for lateral stepping due to increased unsteadiness                              PT Short Term Goals - 12/09/14 1648    PT SHORT TERM GOAL #1   Title Demonstrate correct performance of HEP. Target: 01/10/15   PT SHORT TERM GOAL #2   Title Negative with positional testing for BPPV with pt also reporting no vertigo with bed mobility x1 week. Target: 01/10/15   PT SHORT TERM GOAL #3   Title Improve score on DGI to 18/24 for improving balance. Target: 01/10/15   PT SHORT TERM GOAL #4   Title Demonstrate ability to maintain balance on compliant surface (airex) with feet 6" apart and eyes closed x 15 seconds with supervision and no loss of balance for improved function of vestibular system for balance. Target: 01/10/15            PT Long Term Goals - 12/09/14 1651    PT LONG TERM GOAL #1   Title Verbalize understanding of fall prevention strategies in home environment. Target: 02/07/15   PT LONG TERM GOAL #2   Title Demonstrate ability to ambulate 300' on outdoor uneven surfaces independently for decreased fall risk. Target: 02/07/15   PT LONG TERM GOAL #3   Title Perform TUG test in 10.56 seconds without reports of dizziness with turn. Target: 02/07/15   PT LONG TERM GOAL #4   Title Ascend curb without loss of balance with supervision for improved home access. Target 02/07/15   PT LONG TERM GOAL #5   Title Negotiiate stairs with single hand rail and MOD I without catching feet on steps for decreased risk of falls on stairs. Target: 02/07/15   Additional Long Term Goals   Additional Long Term Goals Yes   PT LONG TERM GOAL #6   Title Increase DGI score to 21/24 for decreased risk of falls. Target 02/07/15               Plan - 01/08/15 1444    Clinical Impression Statement Pt performed various activities on compliant surface today which creates notable challenge to his balance. Pt will conitnue to benefit from skilled PT services to improve balance and eye coordination.   PT Next Visit Plan review HEP        Problem List Patient Active Problem List   Diagnosis Date Noted  . Ataxia 12/05/2014  . Abnormality of gait 12/05/2014  . Neck pain, chronic 12/05/2014  . Brisk deep tendon reflexes 12/05/2014  . Abnormal carotid duplex scan 10/28/2014  . OSA (obstructive sleep apnea) 10/28/2014  . Mood disorder with psychosis 10/28/2014  . Episodes of formed visual hallucinations 10/16/2014  . Faintness   . Syncope 10/15/2014  . Acute on chronic renal failure 10/15/2014  . Right fibular fracture 10/15/2014  . Hypoxia 10/15/2014  . Syncope and collapse 10/15/2014  . CAD (coronary artery disease), native coronary artery 09/29/2014  . Falls frequently 09/29/2014  . Hypertension   .  Hypercholesteremia   . Aortic valve insufficiency   . Depression   . Atrial fibrillation, chronic 06/20/2014  . Hypothyroidism 06/20/2014  . DOE (dyspnea on exertion) 06/20/2014  . Cough 04/29/2014  . Shortness of breath 04/29/2014   Lamar LaundryJennifer Meredeth Furber, PT,DPT,NCS 01/08/2015 2:47 PM Phone [phone] FAX [phone]         [facility] [address] [place],  [place], [address] Phone: [phone]   Fax:  [phone]

## 2015-01-08 NOTE — Patient Instructions (Signed)
   Feet Together (Compliant Surface) Head Motion - Eyes Open then eyes closed   Stand on two stacked pillows with your back to a corner with a chair in front of you and with one fingertip on the chair. With eyes OPEN, move head slowly: up and down 10x, then horizontal 10x, then vertical 10x each direction  THEN:  Repeat the exercises but with eyes CLOSED  Perform daily

## 2015-01-13 ENCOUNTER — Other Ambulatory Visit: Payer: Self-pay | Admitting: *Deleted

## 2015-01-13 DIAGNOSIS — I2510 Atherosclerotic heart disease of native coronary artery without angina pectoris: Secondary | ICD-10-CM

## 2015-01-15 ENCOUNTER — Ambulatory Visit: Payer: Medicare Other

## 2015-01-15 DIAGNOSIS — R279 Unspecified lack of coordination: Secondary | ICD-10-CM | POA: Diagnosis not present

## 2015-01-15 DIAGNOSIS — R2689 Other abnormalities of gait and mobility: Secondary | ICD-10-CM

## 2015-01-15 DIAGNOSIS — R262 Difficulty in walking, not elsewhere classified: Secondary | ICD-10-CM | POA: Diagnosis not present

## 2015-01-15 DIAGNOSIS — R269 Unspecified abnormalities of gait and mobility: Secondary | ICD-10-CM | POA: Diagnosis not present

## 2015-01-15 NOTE — Therapy (Signed)
[facility] [address] [place], [place], [address] Phone: [phone]   Fax:  [phone]  Physical Therapy Treatment  Patient Details  Name: [name] [name] MRN: [id number] Date of Birth: 1931-11-02 Referring Provider:  Antony Blackbird, MD  Encounter Date: 01/15/2015      PT End of Session - 01/15/15 1634    Visit Number 4   Number of Visits 17   Date for PT Re-Evaluation 02/07/15   Authorization Type Medicare-G codes required every 10th visit   PT Start Time 1401   PT Stop Time 1446   PT Time Calculation (min) 45 min      Past Medical History  Diagnosis Date  . Atrial fibrillation   . Hypothyroidism   . Plantar fasciitis   . Seasonal allergies   . Decreased testosterone level   . Varicose veins   . Hypertension   . Hypercholesteremia   . Aortic valve insufficiency   . Depression   . Heart murmur   . DVT (deep venous thrombosis) ~ 2013    "BLE"  . OSA (obstructive sleep apnea)     "suppose to wear mask; I throw it off in my sleep" (10/15/2014)  . COPD (chronic obstructive pulmonary disease)   . Emphysema of lung   . History of blood transfusion 1960    "lots; related to an accident"  . Arthritis     "wee bit; knees, elbows" (10/15/2014)  . Anxiety   . Chronic kidney disease     "down to ~ 1/2 of their regular use; I see kidney dr. in [place]" (10/15/2014)  . Falls frequently     > 20 times in the last year/notes 10/15/2014  . Syncope and collapse "several times"    Past Surgical History  Procedure Laterality Date  . Abdominal aortic aneurysm repair  01/2006; 03/10/2006    Archie Endo 01/12/2011  . Tonsillectomy    . Hernia repair    . Laparoscopic incisional / umbilical / ventral hernia repair  02/2010    VHR w/mesh/notes 03/28/2010  . Laparoscopic cholecystectomy  08/2009    w/IOC/notes 09/18/2009  . Ercp  11/2009    Archie Endo 12/05/2009  . Cardiac catheterization  1990's X 1; 09/2014  . Brain  surgery  1960 X 4    "S/P got my skull busted"  . Gall stone      There were no vitals filed for this visit.  Visit Diagnosis:  Lack of coordination  Imbalance      Subjective Assessment - 01/15/15 1405    Subjective Pt reported he had dizziness when he laid down last night. States he got up very slowly this morning and did not have the dizziness.   Currently in Pain? No/denies            [facility] PT Assessment - 01/15/15 0001    Dynamic Gait Index   Level Surface Normal   Change in Gait Speed Normal   Gait with Horizontal Head Turns Moderate Impairment   Gait with Vertical Head Turns Mild Impairment   Gait and Pivot Turn Normal   Step Over Obstacle Normal   Step Around Obstacles Normal   Steps Normal   Total Score 21     Checked STGs see comments in goals section for details.  Marye Round + for L anterior canalithiasis BPPV Performed Epleys x2 cycle to treat with pt negative for dix hallpike followig the second cycle  Taught [employer] and handout provided to pt to  perform 3 cycles 3x per day until symptoms have been gone for 2 days straight  Completed DGI see above  Inverted bosu ball -static standing with then without UE support, atanding with head turns tried with single fingertip support and MOD A, then fingertip of BUE support and close supervision with 10x vertical and 10x horizontal head turns                        PT Short Term Goals - 01/15/15 1426    PT SHORT TERM GOAL #1   Title Demonstrate correct performance of HEP. Target: 01/10/15   PT SHORT TERM GOAL #2   Title Negative with positional testing for BPPV with pt also reporting no vertigo with bed mobility x1 week. Target: 01/10/15   Status Not Met  pt was not symptomatic initially, but reported symptoms today. Treated for this today. 01/15/15   PT SHORT TERM GOAL #3   Title Improve score on DGI to 18/24 for improving balance. Target: 01/10/15   Status Achieved  21/24 on 01/15/15    PT SHORT TERM GOAL #4   Title Demonstrate ability to maintain balance on compliant surface (airex) with feet 6" apart and eyes closed x 15 seconds with supervision and no loss of balance for improved function of vestibular system for balance. Target: 01/10/15   Status Achieved  23 seconds with supervision without loss of balance           PT Long Term Goals - 12/09/14 1651    PT LONG TERM GOAL #1   Title Verbalize understanding of fall prevention strategies in home environment. Target: 02/07/15   PT LONG TERM GOAL #2   Title Demonstrate ability to ambulate 300' on outdoor uneven surfaces independently for decreased fall risk. Target: 02/07/15   PT LONG TERM GOAL #3   Title Perform TUG test in 10.56 seconds without reports of dizziness with turn. Target: 02/07/15   PT LONG TERM GOAL #4   Title Ascend curb without loss of balance with supervision for improved home access. Target 02/07/15   PT LONG TERM GOAL #5   Title Negotiiate stairs with single hand rail and MOD I without catching feet on steps for decreased risk of falls on stairs. Target: 02/07/15   Additional Long Term Goals   Additional Long Term Goals Yes   PT LONG TERM GOAL #6   Title Increase DGI score to 21/24 for decreased risk of falls. Target 02/07/15               Plan - 01/15/15 1635    Clinical Impression Statement Pt had BPPV today, effectively treated with EPLEYs; was taught brandt daroff for HEP. Continue per POC   PT Next Visit Plan ask about dizziness; continue with dynamic balance on compliant surfaces        Problem List Patient Active Problem List   Diagnosis Date Noted  . Ataxia 12/05/2014  . Abnormality of gait 12/05/2014  . Neck pain, chronic 12/05/2014  . Brisk deep tendon reflexes 12/05/2014  . Abnormal carotid duplex scan 10/28/2014  . OSA (obstructive sleep apnea) 10/28/2014  . Mood disorder with psychosis 10/28/2014  . Episodes of formed visual hallucinations 10/16/2014  . Faintness    . Syncope 10/15/2014  . Acute on chronic renal failure 10/15/2014  . Right fibular fracture 10/15/2014  . Hypoxia 10/15/2014  . Syncope and collapse 10/15/2014  . CAD (coronary artery disease), native coronary artery 09/29/2014  . Falls frequently  09/29/2014  . Hypertension   . Hypercholesteremia   . Aortic valve insufficiency   . Depression   . Atrial fibrillation, chronic 06/20/2014  . Hypothyroidism 06/20/2014  . DOE (dyspnea on exertion) 06/20/2014  . Cough 04/29/2014  . Shortness of breath 04/29/2014   Delrae Sawyers, PT,DPT,NCS 01/15/2015 4:43 PM Phone [phone] FAX [phone]         [facility] [address] [place], [place], [address] Phone: [phone]   Fax:  [phone]

## 2015-01-20 ENCOUNTER — Ambulatory Visit: Payer: Medicare Other

## 2015-01-21 ENCOUNTER — Other Ambulatory Visit: Payer: Self-pay | Admitting: Adult Health

## 2015-01-22 ENCOUNTER — Ambulatory Visit: Payer: Medicare Other

## 2015-01-28 ENCOUNTER — Ambulatory Visit: Payer: Medicare Other | Admitting: Physical Therapy

## 2015-01-30 ENCOUNTER — Encounter: Payer: Medicare Other | Admitting: Rehabilitative and Restorative Service Providers"

## 2015-01-30 ENCOUNTER — Encounter: Payer: Self-pay | Admitting: Physical Therapy

## 2015-01-30 NOTE — Therapy (Signed)
[facility] [address] [place], [place], [address] Phone: [phone]   Fax:  [phone]  Patient Details  Name: [name] [name] MRN: [id number] Date of Birth: 1931-11-21 Referring Provider:  No ref. provider found  Encounter Date: 01/30/2015  PHYSICAL THERAPY DISCHARGE SUMMARY  Visits from Start of Care: 4  Current functional level related to goals / functional outcomes:     PT Long Term Goals - 12/09/14 1651    PT LONG TERM GOAL #1   Title Verbalize understanding of fall prevention strategies in home environment. Target: 02/07/15   PT LONG TERM GOAL #2   Title Demonstrate ability to ambulate 300' on outdoor uneven surfaces independently for decreased fall risk. Target: 02/07/15   PT LONG TERM GOAL #3   Title Perform TUG test in 10.56 seconds without reports of dizziness with turn. Target: 02/07/15   PT LONG TERM GOAL #4   Title Ascend curb without loss of balance with supervision for improved home access. Target 02/07/15   PT LONG TERM GOAL #5   Title Negotiiate stairs with single hand rail and MOD I without catching feet on steps for decreased risk of falls on stairs. Target: 02/07/15   Additional Long Term Goals   Additional Long Term Goals Yes   PT LONG TERM GOAL #6   Title Increase DGI score to 21/24 for decreased risk of falls. Target 02/07/15     * goals not reassessed as patient did not return for remaining visits.   Remaining deficits: See initial evaluation. Per therapy notes, patient treated for BPPV that resolved.   Education / Equipment: HEP.  Plan: Patient agrees to discharge.  Patient goals were not met. Patient is being discharged due to not returning since the last visit.  ?????               Thank you for the referral of this patient.  [place], [place] 01/30/2015, 8:42 PM  [facility] [address] [place], [place], [address] Phone: [phone]   Fax:  [phone]

## 2015-01-31 ENCOUNTER — Other Ambulatory Visit: Payer: Self-pay | Admitting: Adult Health

## 2015-02-03 ENCOUNTER — Encounter: Payer: Self-pay | Admitting: Cardiothoracic Surgery

## 2015-02-03 ENCOUNTER — Ambulatory Visit (INDEPENDENT_AMBULATORY_CARE_PROVIDER_SITE_OTHER): Payer: Medicare Other | Admitting: Cardiothoracic Surgery

## 2015-02-03 VITALS — BP 144/64 | HR 99 | Resp 20 | Ht 72.0 in | Wt 252.5 lb

## 2015-02-03 DIAGNOSIS — I351 Nonrheumatic aortic (valve) insufficiency: Secondary | ICD-10-CM | POA: Diagnosis not present

## 2015-02-03 DIAGNOSIS — I5032 Chronic diastolic (congestive) heart failure: Secondary | ICD-10-CM | POA: Diagnosis not present

## 2015-02-03 DIAGNOSIS — I25119 Atherosclerotic heart disease of native coronary artery with unspecified angina pectoris: Secondary | ICD-10-CM

## 2015-02-03 DIAGNOSIS — I4891 Unspecified atrial fibrillation: Secondary | ICD-10-CM | POA: Diagnosis not present

## 2015-02-03 NOTE — Progress Notes (Signed)
[address]       [place] [id number]             [phone]                    [name] [name] [facility] Medical Record #[id number] Date of Birth: 06-29-1932  Referring: Oralia ManisGehrig, Thomas Richard,* Primary Care: Cain SaupeFULP, CAMMIE, MD Cardiology: Dr Becky AugustaP Nasher   Chief Complaint:    Chief Complaint  Patient presents with  . Follow-up    discuss surgery further    History of Present Illness:    [name] [name] 81 y.o. male is seen in the office  concerning aortic valve replacement and coronary artery bypass grafting.He has [facility] with  aortic valve insufficiency atrial fibrillation and hypertension. The patient was  hospital[facility] at [facility] with increasing dyspnea [name] exertion and difficulty carrying objects and caring for his usual activities. He  underwent further evaluation for consideration of surgery including cardiac cath CT scan of the chest and echocardiogram. At that time it was recommended to him that he needed an aortic valve replacement and coronary artery bypass but was not felt to be strong enough and was discharge to a nursing home. He saw me 4 weeks ago for  opinion concerning   cardiac surgery . At that time he was still very weak and not independent .  Since he has improved and is now living independently.   The patient comes into the office today with his son to finalize plans to proceed with AVR /cabg. . Overall marked improvement from the first visit [name] [name]. He has seen neurology , he was not able to have MRI because of metal in his brain.     Current Activity/ Functional Status:  Patient is  independent with mobility/ambulation, transfers, ADL's, IADL's. . His son notes that he has been walking has markedly improved.  He denies chest pain   Note from [facility]. [name] [name] presented for pre-hydration prior to RHC/LHC. TTE [name] 09/18/2014 showed an LVEF 50%, LVIDs 4.4 cm, severe AR. RHC/LHC [name] 09/20/2014 showed a RA  11 RV 50/13 PA 47/25 (33) PCWP 18 CI 1.6 PVR 4.0 and a 70% distal RCA lesion. After discussion among the heart team, given his frailty, the decision was made to send him to a SNF given his frailty and have him re-evaluated in clinic by Dr. Zebedee IbaGaca for CABG+AVR. He would likely be a poor TAVR candidate given the absence of Ca in his AoV annulus (CT Chest obtained while in house).   [name] Score: At the time of surgery this patient's most appropriate activity status/level should be described as: []     0    Normal activity, no symptoms [x]     1    Restricted in physical strenuous activity but ambulatory, able to do out light work []     2    Ambulatory and capable of self care, unable to do work activities, up and about               >50 % of waking hours                              []     3    Only limited self care, in bed greater than 50% of waking hours []     4    Completely disabled, no  self care, confined to bed or chair []     5    Moribund   Past Medical History  Diagnosis Date  . Atrial fibrillation   . Hypothyroidism   . Plantar fasciitis   . Seasonal allergies   . Decreased testosterone level   . Varicose veins   . Hypertension   . Hypercholesteremia   . Aortic valve insufficiency   . Depression   . Heart murmur   . DVT (deep venous thrombosis) ~ 2013    "BLE"  . OSA (obstructive sleep apnea)     "suppose to wear mask; I throw it off in my sleep" (10/15/2014)  . COPD (chronic obstructive pulmonary disease)   . Emphysema of lung   . History of blood transfusion 1960    "lots; related to an accident"  . Arthritis     "wee bit; knees, elbows" (10/15/2014)  . Anxiety   . Chronic kidney disease     "down to ~ 1/2 of their regular use; I see kidney dr. in [place]" (10/15/2014)  . Falls frequently     > 20 times in the last year/notes 10/15/2014  . Syncope and collapse "several times"    Past Surgical History  Procedure Laterality Date  . Abdominal aortic aneurysm repair   01/2006; 03/10/2006    Hattie Perch 01/12/2011  . Tonsillectomy    . Hernia repair    . Laparoscopic incisional / umbilical / ventral hernia repair  02/2010    VHR w/mesh/notes 03/28/2010  . Laparoscopic cholecystectomy  08/2009    w/IOC/notes 09/18/2009  . Ercp  11/2009    Hattie Perch 12/05/2009  . Cardiac catheterization  1990's X 1; 09/2014  . Brain surgery  1960 X 4    "S/P got my skull busted"  . Gall stone    previous head injury 1960 to hospitalize 93 days  Family History  Problem Relation Age of Onset  . Heart disease Father   . Rheum arthritis Mother   . Rheum arthritis Father   . Cancer Sister   . Stroke Sister     History   Social History  . Marital Status: Married    Spouse Name: [name] [name]  . Number of Children: 1  . Years of Education: Master's   Occupational History  . Not [name] file.   Social History Main Topics  . Smoking status: Former Smoker -- 2.00 packs/day for 22 years    Types: Cigarettes    Quit date: 04/29/1974  . Smokeless tobacco: Never Used  . Alcohol Use: 0.0 oz/week    0 Standard drinks or equivalent per week     Comment: 1 beer per year   . Drug Use: No  . Sexual Activity: Not Currently   Other Topics Concern  . Not [name] file   Social History Narrative   Lives at home with wife.   Right handed.   Caffeine use: 3 cups coffee per day.   Drinks 4 cups tea per day   Drinks soda very rarely     History  Smoking status  . Former Smoker -- 2.00 packs/day for 22 years  . Types: Cigarettes  . Quit date: 04/29/1974  Smokeless tobacco  . Never Used    History  Alcohol Use  . 0.0 oz/week  . 0 Standard drinks or equivalent per week    Comment: 1 beer per year      No Known Allergies  Current Outpatient Prescriptions  Medication Sig Dispense Refill  . acetaminophen (TYLENOL) 325  MG tablet Take 2 tablets (650 mg total) by mouth every 6 (six) hours as needed for mild pain (or Fever >/= 101).    Marland Kitchen albuterol (PROVENTIL HFA;VENTOLIN HFA) 108 (90 BASE)  MCG/ACT inhaler Inhale 2 puffs into the lungs every 6 (six) hours as needed for wheezing or shortness of breath.    Marland Kitchen amLODipine (NORVASC) 5 MG tablet Take 5 mg by mouth daily.    Marland Kitchen apixaban (ELIQUIS) 2.5 MG TABS tablet Take 1 tablet (2.5 mg total) by mouth 2 (two) times daily. 60 tablet 3  . atorvastatin (LIPITOR) 10 MG tablet Take 10 mg by mouth daily at 6 PM.    . buPROPion (WELLBUTRIN XL) 300 MG 24 hr tablet Take 300 mg by mouth daily.    . busPIRone (BUSPAR) 15 MG tablet Take 15 mg by mouth 2 (two) times daily.    . calcium carbonate (TUMS - DOSED IN MG ELEMENTAL CALCIUM) 500 MG chewable tablet Chew 1 tablet by mouth as needed for indigestion or heartburn.    . clonazePAM (KLONOPIN) 0.5 MG tablet Take 1 tablet (0.5 mg total) by mouth at bedtime as needed for anxiety. 30 tablet 0  . digoxin (LANOXIN) 0.125 MG tablet Take 0.125 mg by mouth daily.    Marland Kitchen diltiazem (TIAZAC) 120 MG 24 hr capsule Take 120 mg by mouth daily.    Marland Kitchen dutasteride (AVODART) 0.5 MG capsule Take 0.5 mg by mouth daily.    Marland Kitchen escitalopram (LEXAPRO) 20 MG tablet Take 20 mg by mouth daily.    . Eszopiclone (ESZOPICLONE) 3 MG TABS Take 3 mg by mouth at bedtime. Take immediately before bedtime    . fluticasone (FLONASE) 50 MCG/ACT nasal spray Place 1 spray into both nostrils daily. 9.9 g 2  . folic acid (FOLVITE) 1 MG tablet Take 1 mg by mouth daily.    Marland Kitchen lamoTRIgine (LAMICTAL) 100 MG tablet Take 100 mg by mouth 2 (two) times daily. Breakfast and dinner    . levothyroxine (SYNTHROID, LEVOTHROID) 150 MCG tablet Take 150 mcg by mouth daily before breakfast.    . Melatonin 3 MG TABS Take 1 tablet by mouth at bedtime.     No current facility-administered medications for this visit.      Review of Systems:     Cardiac Review of Systems: Y or N  Chest Pain [ n   ]  Resting SOB [ y  ] Exertional SOB  [ y ]  Orthopnea [place].Etienne  ]   Pedal Edema [ y  ]    Palpitations [ y ] Syncope  [ n ]   Presyncope [ y  ]  General Review of Systems:  [Y] = yes [  ]=no Constitional: recent weight change [ y ];  Wt loss over the last 3 months [   ] anorexia [  ]; fatigue [  ]; nausea [  ]; night sweats [  ]; fever [  ]; or chills [  ];          Dental: poor dentition[  ]; Last Dentist visit:   Eye : blurred vision [  ]; diplopia [   ]; vision changes [  ];  Amaurosis fugax[  ]; Resp: cough [ y ];  wheezingy[  ];  hemoptysis[n  ]; shortness of breath[ y ]; paroxysmal nocturnal dyspnea[ y ]; dyspnea [name] exertion[  y]; or orthopnea[ y ];  GI:  gallstones[  ], vomiting[  ];  dysphagia[  ]; melena[  ];  hematochezia [  ];  heartburn[  ];   Hx of  Colonoscopy[y  ]; GU: kidney stones [  ]; hematuria[  ];   dysuria [  ];  nocturia[  ];  history of     obstruction [  ]; urinary frequency [  ]             Skin: rash, swelling[  ];, hair loss[  ];  peripheral edema[  ];  or itching[  ]; Musculosketetal: myalgias[  ];  joint swelling[  ];  joint erythema[  ];  joint pain[  ];  back pain[  ];  Heme/Lymph: bruising[ y ];  bleeding[ n ];  anemia[ n ];  Neuro: TIA[  ];  headaches[  ];  stroke[n  ];  vertigo[ y ];  seizures[n  ];   paresthesias[  ];  difficulty walking[improved  ];  Psych:depression[y  ]; Kendell Bane  ];  Endocrine: diabetes[n  ];  thyroid dysfunction[ y ];  Immunizations: Flu up to date [ ? ]; Pneumococcal up to date [?  ];  Other:  Physical Exam: BP 144/64 mmHg  Pulse 99  Resp 20  Ht 6' (1.829 m)  Wt 252 lb 8 oz (114.533 kg)  BMI 34.24 kg/m2  SpO2 97%  PHYSICAL EXAMINATION: General appearance: alert, cooperative and patient is very alert today  Head: Normocephalic, without obvious abnormality, atraumatic Neck: no adenopathy, no carotid bruit, no JVD, supple, symmetrical, trachea midline, thyroid not enlarged, symmetric, no tenderness/mass/nodules and scar from left carotid surgery Lymph nodes: Cervical, supraclavicular, and axillary nodes normal. Resp: clear to auscultation bilaterally Back: symmetric, no curvature. ROM normal. No CVA  tenderness. Cardio: irregularly irregular rhythm GI: soft, non-tender; bowel sounds normal; no masses,  no organomegaly Extremities: edema mild bilaterial edema Neurologic: Mental status: Alert, oriented, thought content appropriate, alertness: alert, mildly confused about some events and clear about others Coordination:  walks  Gait:Patient's independently ambulatory in and out of the office improved from his previous visit   Diagnostic Studies & Laboratory data:     Recent Radiology Findings:      [facility] at [facility]: Report only Chest CT without contrast  Indication: I48.2 Chronic atrial fibrillation, I71.4 Abdominal aortic aneurysm, without rupture, preop  Comparison: March 27, 2012  Protocol:  Volumetric non-contrast chest CT was performed from the lower neck through the adrenal glands.  This study was acquired without intravenous contrast given the patients indications for the examination. 2D multiplanar reconstruction including sagittal and coronal reconstruction was utilized to enhance assessment of the vasculature including sagittal and coronal reconstructed images. 5 x 5 mm axial maximum intensity projection images (MIPS) were reconstructed to facilitate lung nodule detection.  Findings: Vasculature The ascending thoracic aorta measures up to 4.0 cm. There are moderate mural calcifications. Fat planes between the descending aorta, pulmonary artery and right ventricle and the sternum are preserved. The right ventricle is 2.1 cm posterior to the xiphoid. Brachiocephalic vein is 2 cm posterior to the sternal cortex. The ascending aorta is 4.3 cm posterior to the sternal cortex. Moderate coronary artery calcifications.  Non vascular structures: Debris is seen within the mid trachea. The central airways are patent. Again seen is linear opacities in the right lower lobe with mild bronchiectasis with elevation the right hemidiaphragm, likely related to scarring. Mild  centrilobular emphysema. Unchanged 4 mm right middle lobe pulmonary nodule (image 248, series 4). Unchanged 4 mm right lower lobe pulmonary nodule abutting the accessory fissure (image 261, series 4) . Unchanged 4 mm subpleural right lower lobe pulmonary  nodule (image 298, series 4). Unchanged 5 mm right lower lobe pulmonary nodule (image 186, series 4). No pleural effusion.  Neck base is normal.  Mild cardiomegaly. No pericardial effusion. No mediastinal, hilar, or axillary lymphadenopathy.  No suspicious lytic or sclerotic osseous lesions. Multilevel degenerative changes of the thoracolumbar spine.  Small hiatal hernia. The patient is status post cholecystectomy. Surgical clips are seen in the gallbladder fossa. Limited images of the upper abdomen are unremarkable.  Impression: 1.Sternotomy measurements as described above. 2.Coronary artery calcifications. 3.Stable pulmonary nodules.   Electronically Reviewed by:  Larry Sierras, MD Electronically Reviewed [name]:  09/19/2014 11:20 AM  I have reviewed the images and concur with the above findings.  Electronically Signed by:  Beckie Salts, MD Electronically Signed [name]:  09/20/2014 12:06 PM  ECHO:  [facility]            [place], [place]                                                      [id number]    DOB: 1932-05-31                CARDIAC DIAGNOSTIC UNIT               Date: 09/18/2014 16:00:00                                                      Adult     Male   Age: 81                  ECHO-DOPPLER REPORT                 Inpatient                                                      [id number] ---------------------------------------------------- MD1: Ward, Lajoyce Corners     STUDY: Chest Wall          TAPE: 0000:00:0:00:00 BP: 143/66      ECHO: Yes   DOPPLER: Yes  FILE: [id number]:   HR: 98     COLOR: Yes  CONTRAST: No      MACHINE: IE33 #[id number]  Height: 72 in RV BIOPSY: No         3D: No   SOUND QLTY: Moderate   Weight: 259 lbs    MEDIUM: None                                      BSA: 2.44,  BMI: 35.10 ------------------------------------------------------------------------------    HISTORY:  Arrhythmia and Valvular disease     REASON: Assess Aortic Regurgitation INDICATION: I48.2 - Chronic atrial fibrillation. I35.1 - Nonrheumatic aortic             (valve) insufficiency. Q25.3 - Supravalvular aortic stenosis.             I48.91 - Unspecified atrial fibrillation.  ECHOCARDIOGRAPHIC MEASUREMENTS ----------------------------------------------- 2D DIMENSIONS AORTA          Values   Normal Range       MAIN PA      Values   Normal Range      Annulus:  nm*  cm  [2.3 - 2.9]           PA Main:  nm*  cm  [1.5 - 2.1]    Aorta Sin:   3.8 cm  [3.1 - 3.7]        RIGHT VENTRICLE  ST Junction:  nm*  cm  [2.6 - 3.2]           RV Base:  nm*  cm  [<= 4.2]    Asc.Aorta:   4.0 cm  [2.6 - 3.4]            RV Mid:  nm*  cm  [<= 3.5] LEFT VENTRICLE                              RV Length:  nm*  cm  [<= 8.6]        LVIDd:   6.0 cm  [4.2 - 5.9]        INFERIOR VENA CAVA        LVIDs:   4.4 cm                        Max.IVC:  nm*  cm  [<= 2.1]           FS:  27%      [> 25%]               Min.IVC:  nm*  cm  [<= 1.7]          SWT:   1.1 cm  [0.6 - 1.0]        __________________          PWT:   1.0 cm  [0.6 - 1.0]        nm* - not measured LEFT ATRIUM      LA Diam:   5.9 cm  [3.0 - 4.0]      LA Area:  39   cm2 [< 20]    LA Volume: 151   mL  [18 - 58]  ECHOCARDIOGRAPHIC DESCRIPTIONS ----------------------------------------------- AORTIC ROOT         Size: DILATED   Dissection: INDETERM FOR DISSECTION  AORTIC VALVE     Leaflets: Tricuspid             Morphology: MILDLY THICKENED     Mobility: Fully Mobile  LEFT VENTRICLE                                      Anterior: Normal         Size: Normal                                 Lateral: Normal  Contraction: REGIONALLY IMPAIRED                     Septal:  HYPOCONTRACTILE   Closest EF: 50%  (Estimated)  Apical: Normal    LV masses: No Masses                             Inferior: Normal          LVH: None                                 Posterior: Normal Dias.FxClass: Normal  MITRAL VALVE     Leaflets: Normal                  Mobility: Fully mobile   Morphology: ANNULAR CALC  LEFT ATRIUM         Size: MODERATELY ENLARGED    LA masses: No masses               Normal IAS  MAIN PA         Size: Normal  PULMONIC VALVE   Morphology: Normal     Mobility: Fully Mobile  RIGHT VENTRICLE         Size: Normal                    Free wall: Normal  Contraction: Normal                    RV masses: No Masses        TAPSE:   2.6 cm,  Normal Range [>= 1.6 cm]  TRICUSPID VALVE     Leaflets: Normal                  Mobility: Fully mobile   Morphology: Normal  RIGHT ATRIUM         Size: Normal                     RA Other: None    RA masses: No masses  PERICARDIUM        Fluid: No effusion  INFERIOR VENACAVA         Size: Normal     Normal respiratory collapse  DOPPLER ECHO and OTHER SPECIAL PROCEDURES ------------------------------------    Aortic: SEVERE AR              No AS     Mitral: No MR                  No MS    MV Inflow E Vel.= nm* cm/s  MV Annulus E'Vel.= nm* cm/s  E/E'Ratio= nm*  Tricuspid: TRIVIAL TR             No TS  Pulmonary: No PR                  No PS      Other:  INTERPRETATION ---------------------------------------------------------------   MILD LV DYSFUNCTION (See above)   NORMAL LA PRESSURES WITH NORMAL DIASTOLIC FUNCTION   NORMAL RIGHT VENTRICULAR SYSTOLIC FUNCTION   VALVULAR REGURGITATION: SEVERE AR, TRIVIAL TR   NO VALVULAR STENOSIS   Compared with prior Echo study [name] 11/13/2013: LEFT VENTRICULAR DIMENSIONS   ARE INCREASED; ASCENDING AORTA MEASURES LARGER BUT IS CONSISTENT WITH   MEASUREMENT FROM 03/17/2012 STUDY.   (Report version 3.0)                    Interpreted and  Electronically signed  Perform. by: Percell Locus, RDCS  by: Earlie Lou, MD  Resp.Person: Percell Locus, RDCS                  [name]: 09/18/2014 17:18:19  Recent Lab Findings: Lab Results  Component Value Date   WBC 5.7 10/17/2014   HGB 11.1* 10/17/2014   HCT 33.2* 10/17/2014   PLT 175 10/17/2014   GLUCOSE 99 10/18/2014   ALT 18 06/20/2014   AST 21 06/20/2014   NA 141 10/18/2014   K 3.4* 10/18/2014   CL 106 10/18/2014   CREATININE 1.61* 10/18/2014   BUN 10 10/18/2014   CO2 30 10/18/2014   TSH 3.763 10/15/2014   INR 1.17 06/20/2014    Chronic Kidney Disease   Stage I     GFR >90  Stage II    GFR 60-89  Stage IIIA GFR 45-59  Stage IIIB GFR 30-44  Stage IV   GFR 15-29  Stage V    GFR  <15  Lab Results  Component Value Date   CREATININE 1.61* 10/18/2014     [facility]iac [facility]  [facility]- has disease in right coronary 60-70 %   Assessment / Plan:   1/ Acute [name] chronic diastolic Heart failure, with preserved EF likely from symptomatic AI Patient appears euvolemic [name] exam today [name]  lasix  And linopril   2/Afib: rate controled and [name] eloquis - EKG, tele   3/CKD: patient baseline seems to be around 1.6, currently at baseline   4/depression/anxiety/insomnia  [name] lexapro, lamictal, lunesta   5/ HTN , HL  Treated  6/ hypothyroidism: TSH is  3.7      [name] synthroid     7/ stable pulmonary nodules per report from [facility]  I have seen the patient and reviewed his chart including cardiac cath and echo films from [facility]. Since first seen 3 months ago ago weeks the patient has has made very significant improvement his overall functional status . He is now independent growth to the office [name] his own today. He had appointment for neurology evaluation early yesterday,.   I have reviewed the patient's information studies from [facility] with Dr. Excell Seltzer in regard to being a possible TAVER  candidate. Because of the patient's primary aortic insufficiency and  very little calcium in the aortic root Or would not be technically suitable. I have discussed with the patient further open aortic valve replacement and coronary artery bypass grafting in consideration of atrial clipping  procedure.  Risks and options of surgery are discussed. STS risk 22% combined death and morbidity .  Patient willing to proceed with AVR,tissue and cabg and atrial clip.  Plan June 14 .  Wll then d/c Eliquis June 10 days  [name] MD      [address] [place] [place] [address] Office [phone]   Beeper [phone]  02/03/2015 7:14 PM

## 2015-02-04 ENCOUNTER — Encounter: Payer: Self-pay | Admitting: *Deleted

## 2015-02-04 NOTE — Progress Notes (Signed)
Cardiology Office Note   Date:  02/05/2015   ID:  [name] [name], DOB 1931/09/15, MRN [id number]  PCP:  [name] Saupe, MD  Cardiologist:   Vesta Mixer, MD   Chief Complaint  Patient presents with  . Coronary Artery Disease   Problem List: 1. Aortic insufficiency- he has severe AI.    2. Hyperlipidemia   3. HTN-  4. Obstructive sleep apnea 5. AAA - 2007 , [name] 6. Hypothyroidism 7. COPD  8. Atrial fib:  -    History of Present Illness: [name] [name] is a 81 y.o. male who presents for evaluation for possible aortic valve replacement . He has been followed by the cardiologist at [facility] for years.   He wants to have his AVR here. Has chronic shortness of breath.  Gets worn out taking a load of laundry up the stairs.  Has had recent echos at [facility].  (have been incorporated in our system )   His overall condition has declined over the past 6 months.  Has some balance issues.  Has orthostatic hypotension.   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   Can walk 1/4 mile  Can climb 2 flights of stairs.  No PND or orthopnea.   No syncope.    February 05, 2015: He has been seen by Dr. Tyrone Sage.  The plan is to consider AVR with possible MAZE  next week. He had an echo and cath at [facility] in January     Past Medical History  Diagnosis Date  . Atrial fibrillation   . Hypothyroidism   . Plantar fasciitis   . Seasonal allergies   . Decreased testosterone level   . Varicose veins   . Hypertension   . Hypercholesteremia   . Aortic valve insufficiency   . Depression   . Heart murmur   . DVT (deep venous thrombosis) ~ 2013    "BLE"  . OSA (obstructive sleep apnea)     "suppose to wear mask; I throw it off in my sleep" (10/15/2014)  . COPD (chronic obstructive pulmonary disease)   . Emphysema of lung   . History of blood transfusion 1960    "lots; related to an  accident"  . Arthritis     "wee bit; knees, elbows" (10/15/2014)  . Anxiety   . Chronic kidney disease     "down to ~ 1/2 of their regular use; I see kidney dr. in [place]" (10/15/2014)  . Falls frequently     > 20 times in the last year/notes 10/15/2014  . Syncope and collapse "several times"    Past Surgical History  Procedure Laterality Date  . Abdominal aortic aneurysm repair  01/2006; 03/10/2006    Hattie Perch 01/12/2011  . Tonsillectomy    . Hernia repair    . Laparoscopic incisional / umbilical / ventral hernia repair  02/2010    VHR w/mesh/notes 03/28/2010  . Laparoscopic cholecystectomy  08/2009    w/IOC/notes 09/18/2009  . Ercp  11/2009    Hattie Perch 12/05/2009  . Cardiac catheterization  1990's X 1; 09/2014  . Brain surgery  1960 X 4    "S/P got my skull busted"  . Gall stone       Current Outpatient Prescriptions  Medication Sig Dispense Refill  . acetaminophen (TYLENOL) 325 MG tablet Take 2 tablets (650 mg total) by mouth every 6 (six) hours  as needed for mild pain (or Fever >/= 101).    Marland Kitchen. albuterol (PROVENTIL HFA;VENTOLIN HFA) 108 (90 BASE) MCG/ACT inhaler Inhale 2 puffs into the lungs every 6 (six) hours as needed for wheezing or shortness of breath.    Marland Kitchen. amLODipine (NORVASC) 5 MG tablet Take 5 mg by mouth daily.    Marland Kitchen. apixaban (ELIQUIS) 2.5 MG TABS tablet Take 1 tablet (2.5 mg total) by mouth 2 (two) times daily. 60 tablet 3  . buPROPion (WELLBUTRIN XL) 300 MG 24 hr tablet Take 300 mg by mouth daily.    . busPIRone (BUSPAR) 15 MG tablet Take 15 mg by mouth 2 (two) times daily.    . calcium carbonate (TUMS - DOSED IN MG ELEMENTAL CALCIUM) 500 MG chewable tablet Chew 1 tablet by mouth as needed for indigestion or heartburn.    . clonazePAM (KLONOPIN) 0.5 MG tablet Take 1 tablet (0.5 mg total) by mouth at bedtime as needed for anxiety. 30 tablet 0  . digoxin (LANOXIN) 0.125 MG tablet Take 0.125 mg by mouth daily.    Marland Kitchen. diltiazem (TIAZAC) 120 MG 24 hr capsule Take 120 mg by mouth  daily.    Marland Kitchen. dutasteride (AVODART) 0.5 MG capsule Take 0.5 mg by mouth daily.    Marland Kitchen. escitalopram (LEXAPRO) 20 MG tablet Take 20 mg by mouth daily.    . Eszopiclone (ESZOPICLONE) 3 MG TABS Take 3 mg by mouth at bedtime. Take immediately before bedtime    . fluticasone (FLONASE) 50 MCG/ACT nasal spray Place 1 spray into both nostrils daily. 9.9 g 2  . folic acid (FOLVITE) 1 MG tablet Take 1 mg by mouth daily.    Marland Kitchen. lamoTRIgine (LAMICTAL) 100 MG tablet Take 100 mg by mouth 2 (two) times daily. Breakfast and dinner    . levothyroxine (SYNTHROID, LEVOTHROID) 150 MCG tablet Take 150 mcg by mouth daily before breakfast.    . lisinopril (PRINIVIL,ZESTRIL) 40 MG tablet Take 40 mg by mouth at bedtime.  3  . Melatonin 3 MG TABS Take 1 tablet by mouth at bedtime.     No current facility-administered medications for this visit.    Allergies:   Review of patient's allergies indicates no known allergies.    Social History:  The patient  reports that he quit smoking about 40 years ago. His smoking use included Cigarettes. He has a 44 pack-year smoking history. He has never used smokeless tobacco. He reports that he drinks alcohol. He reports that he does not use illicit drugs.   Family History:  The patient's family history includes Cancer in his sister; Heart disease in his father; Rheum arthritis in his father and mother; Stroke in his sister.    ROS:  Please see the history of present illness.    Review of Systems: Constitutional:  denies fever, chills, diaphoresis, appetite change and fatigue.  HEENT: denies photophobia, eye pain, redness, hearing loss, ear pain, congestion, sore throat, rhinorrhea, sneezing, neck pain, neck stiffness and tinnitus.  Respiratory: denies SOB, DOE, cough, chest tightness, and wheezing.  Cardiovascular: denies chest pain, palpitations and leg swelling.  Gastrointestinal: denies nausea, vomiting, abdominal pain, diarrhea, constipation, blood in stool.  Genitourinary:  denies dysuria, urgency, frequency, hematuria, flank pain and difficulty urinating.  Musculoskeletal: denies  myalgias, back pain, joint swelling, arthralgias and gait problem.   Skin: denies pallor, rash and wound.  Neurological: denies dizziness, seizures, syncope, weakness, light-headedness, numbness and headaches.   Hematological: denies adenopathy, easy bruising, personal or family bleeding history.  Psychiatric/ Behavioral:  denies suicidal ideation, mood changes, confusion, nervousness, sleep disturbance and agitation.       All other systems are reviewed and negative.    PHYSICAL EXAM: VS:  BP 130/66 mmHg  Pulse 83  Ht 6' (1.829 m)  Wt 115.577 kg (254 lb 12.8 oz)  BMI 34.55 kg/m2 , BMI Body mass index is 34.55 kg/(m^2). GEN: Well nourished, well developed, in no acute distress HEENT: normal Neck: no JVD, carotid bruits, or masses Cardiac: Irreg. Irreg. ; no murmurs, rubs, or gallops,no edema  Respiratory:  clear to auscultation bilaterally, normal work of breathing GI: soft, nontender, nondistended, + BS MS: no deformity or atrophy Skin: warm and dry, no rash Neuro:  Strength and sensation are intact Psych: normal   EKG:  EKG is not ordered today. The ekg ordered Feb. 17, 2016  demonstrates atrial fib with controlled rate.    Recent Labs: 06/20/2014: ALT 18; Pro B Natriuretic peptide (BNP) 930.1* 10/15/2014: B Natriuretic Peptide 164.8*; TSH 3.763 10/17/2014: Hemoglobin 11.1*; Platelets 175 10/18/2014: BUN 10; Creatinine, Ser 1.61*; Potassium 3.4*; Sodium 141    Lipid Panel No results found for: CHOL, TRIG, HDL, CHOLHDL, VLDL, LDLCALC, LDLDIRECT    Wt Readings from Last 3 Encounters:  02/05/15 115.577 kg (254 lb 12.8 oz)  02/03/15 114.533 kg (252 lb 8 oz)  01/02/15 115.214 kg (254 lb)      Other studies Reviewed: Additional studies/ records that were reviewed today include: . Review of the above records demonstrates:    ASSESSMENT AND PLAN:  1. Aortic  insufficiency  His atrial fib has worsened from Sept. 2015 - Jan. 2016.   He has DOE that has worsened over the past couple months. He's been in a skilled nursing facility and has made some progress. His endurance is much better.  He is scheduled to have AVR +/- MAZE next week.   2. Hyperlipidemia  3. HTN - BP is well controlled.   4. Obstructive sleep apnea  5. AAA - 2007 , Cari Caraway  6. Hypothyroidism  7. COPD   8. Atrial fib:  -  He is in chronic atrial fibrillation. He's currently on Eliquis. His rate is well-controlled. Will hold the Eliquis 3-4 days prior to surgery . Dr. Tyrone Sage may be able to do a MAZE procedure with the AVR.    Current medicines are reviewed at length with the patient today.  The patient does not have concerns regarding medicines.  The following changes have been made:  no change  Labs/ tests ordered today include:  No orders of the defined types were placed in this encounter.     Disposition:   FU with me in 3 months.     Signed, Nahser, Deloris Ping, MD  02/05/2015 4:47 PM    [facility] [address] [place], [place], [place]  [address] Phone: [phone]; Fax: [phone]

## 2015-02-05 ENCOUNTER — Ambulatory Visit (INDEPENDENT_AMBULATORY_CARE_PROVIDER_SITE_OTHER): Payer: Medicare Other | Admitting: Cardiovascular Disease

## 2015-02-05 ENCOUNTER — Encounter: Payer: Self-pay | Admitting: Cardiovascular Disease

## 2015-02-05 VITALS — BP 130/66 | HR 83 | Ht 72.0 in | Wt 254.8 lb

## 2015-02-05 DIAGNOSIS — I25119 Atherosclerotic heart disease of native coronary artery with unspecified angina pectoris: Secondary | ICD-10-CM | POA: Diagnosis not present

## 2015-02-05 DIAGNOSIS — I351 Nonrheumatic aortic (valve) insufficiency: Secondary | ICD-10-CM

## 2015-02-05 NOTE — Patient Instructions (Signed)
Medication Instructions:  Your physician recommends that you continue on your current medications as directed. Please refer to the Current Medication list given to you today.   Labwork: None Ordered   Testing/Procedures: None Ordered   Follow-Up: Your physician recommends that you schedule a follow-up appointment in: 3 months with Dr. Nahser      

## 2015-02-07 ENCOUNTER — Encounter (HOSPITAL_COMMUNITY)
Admission: RE | Admit: 2015-02-07 | Discharge: 2015-02-07 | Disposition: A | Discharge status: Home / Self Care | Payer: Medicare Other | Source: Ambulatory Visit | Attending: Cardiothoracic Surgery | Admitting: Cardiothoracic Surgery

## 2015-02-07 ENCOUNTER — Ambulatory Visit (HOSPITAL_COMMUNITY)
Admission: RE | Admit: 2015-02-07 | Discharge: 2015-02-07 | Disposition: A | Discharge status: Home / Self Care | Payer: Medicare Other | Source: Ambulatory Visit | Attending: Cardiothoracic Surgery | Admitting: Cardiothoracic Surgery

## 2015-02-07 ENCOUNTER — Encounter (HOSPITAL_COMMUNITY): Payer: Self-pay

## 2015-02-07 VITALS — BP 113/55 | HR 64 | Temp 97.6°F | Resp 20 | Ht 72.0 in | Wt 255.0 lb

## 2015-02-07 DIAGNOSIS — Z01818 Encounter for other preprocedural examination: Secondary | ICD-10-CM | POA: Insufficient documentation

## 2015-02-07 DIAGNOSIS — I351 Nonrheumatic aortic (valve) insufficiency: Secondary | ICD-10-CM | POA: Diagnosis not present

## 2015-02-07 DIAGNOSIS — J90 Pleural effusion, not elsewhere classified: Secondary | ICD-10-CM | POA: Diagnosis not present

## 2015-02-07 DIAGNOSIS — I352 Nonrheumatic aortic (valve) stenosis with insufficiency: Secondary | ICD-10-CM | POA: Diagnosis not present

## 2015-02-07 DIAGNOSIS — I5033 Acute on chronic diastolic (congestive) heart failure: Secondary | ICD-10-CM | POA: Diagnosis not present

## 2015-02-07 DIAGNOSIS — Z86718 Personal history of other venous thrombosis and embolism: Secondary | ICD-10-CM | POA: Diagnosis not present

## 2015-02-07 DIAGNOSIS — Z8249 Family history of ischemic heart disease and other diseases of the circulatory system: Secondary | ICD-10-CM | POA: Diagnosis not present

## 2015-02-07 DIAGNOSIS — I2510 Atherosclerotic heart disease of native coronary artery without angina pectoris: Secondary | ICD-10-CM

## 2015-02-07 DIAGNOSIS — K219 Gastro-esophageal reflux disease without esophagitis: Secondary | ICD-10-CM | POA: Diagnosis present

## 2015-02-07 DIAGNOSIS — Z87891 Personal history of nicotine dependence: Secondary | ICD-10-CM | POA: Diagnosis not present

## 2015-02-07 DIAGNOSIS — I482 Chronic atrial fibrillation: Secondary | ICD-10-CM | POA: Diagnosis not present

## 2015-02-07 DIAGNOSIS — I129 Hypertensive chronic kidney disease with stage 1 through stage 4 chronic kidney disease, or unspecified chronic kidney disease: Secondary | ICD-10-CM | POA: Diagnosis present

## 2015-02-07 DIAGNOSIS — I4891 Unspecified atrial fibrillation: Secondary | ICD-10-CM | POA: Diagnosis not present

## 2015-02-07 DIAGNOSIS — G4733 Obstructive sleep apnea (adult) (pediatric): Secondary | ICD-10-CM | POA: Diagnosis present

## 2015-02-07 DIAGNOSIS — I358 Other nonrheumatic aortic valve disorders: Secondary | ICD-10-CM | POA: Diagnosis not present

## 2015-02-07 DIAGNOSIS — Z7901 Long term (current) use of anticoagulants: Secondary | ICD-10-CM | POA: Diagnosis not present

## 2015-02-07 DIAGNOSIS — K760 Fatty (change of) liver, not elsewhere classified: Secondary | ICD-10-CM | POA: Diagnosis not present

## 2015-02-07 DIAGNOSIS — N390 Urinary tract infection, site not specified: Secondary | ICD-10-CM | POA: Diagnosis not present

## 2015-02-07 DIAGNOSIS — G9340 Encephalopathy, unspecified: Secondary | ICD-10-CM | POA: Diagnosis not present

## 2015-02-07 DIAGNOSIS — E780 Pure hypercholesterolemia: Secondary | ICD-10-CM | POA: Diagnosis present

## 2015-02-07 DIAGNOSIS — E039 Hypothyroidism, unspecified: Secondary | ICD-10-CM | POA: Diagnosis present

## 2015-02-07 DIAGNOSIS — Z954 Presence of other heart-valve replacement: Secondary | ICD-10-CM | POA: Diagnosis not present

## 2015-02-07 DIAGNOSIS — J9811 Atelectasis: Secondary | ICD-10-CM | POA: Diagnosis not present

## 2015-02-07 DIAGNOSIS — K859 Acute pancreatitis, unspecified: Secondary | ICD-10-CM | POA: Diagnosis not present

## 2015-02-07 DIAGNOSIS — D62 Acute posthemorrhagic anemia: Secondary | ICD-10-CM | POA: Diagnosis not present

## 2015-02-07 DIAGNOSIS — I6521 Occlusion and stenosis of right carotid artery: Secondary | ICD-10-CM | POA: Insufficient documentation

## 2015-02-07 DIAGNOSIS — Z952 Presence of prosthetic heart valve: Secondary | ICD-10-CM | POA: Diagnosis not present

## 2015-02-07 DIAGNOSIS — R0602 Shortness of breath: Secondary | ICD-10-CM | POA: Diagnosis not present

## 2015-02-07 DIAGNOSIS — R531 Weakness: Secondary | ICD-10-CM | POA: Diagnosis not present

## 2015-02-07 DIAGNOSIS — J449 Chronic obstructive pulmonary disease, unspecified: Secondary | ICD-10-CM | POA: Diagnosis present

## 2015-02-07 DIAGNOSIS — I350 Nonrheumatic aortic (valve) stenosis: Secondary | ICD-10-CM | POA: Diagnosis not present

## 2015-02-07 DIAGNOSIS — Z01810 Encounter for preprocedural cardiovascular examination: Secondary | ICD-10-CM | POA: Diagnosis not present

## 2015-02-07 DIAGNOSIS — R5381 Other malaise: Secondary | ICD-10-CM | POA: Diagnosis not present

## 2015-02-07 DIAGNOSIS — N400 Enlarged prostate without lower urinary tract symptoms: Secondary | ICD-10-CM | POA: Diagnosis present

## 2015-02-07 DIAGNOSIS — F329 Major depressive disorder, single episode, unspecified: Secondary | ICD-10-CM | POA: Diagnosis present

## 2015-02-07 DIAGNOSIS — Z9089 Acquired absence of other organs: Secondary | ICD-10-CM | POA: Diagnosis not present

## 2015-02-07 DIAGNOSIS — N183 Chronic kidney disease, stage 3 (moderate): Secondary | ICD-10-CM | POA: Diagnosis present

## 2015-02-07 DIAGNOSIS — Z79899 Other long term (current) drug therapy: Secondary | ICD-10-CM | POA: Diagnosis not present

## 2015-02-07 DIAGNOSIS — F419 Anxiety disorder, unspecified: Secondary | ICD-10-CM | POA: Diagnosis present

## 2015-02-07 DIAGNOSIS — G4700 Insomnia, unspecified: Secondary | ICD-10-CM | POA: Diagnosis present

## 2015-02-07 DIAGNOSIS — D696 Thrombocytopenia, unspecified: Secondary | ICD-10-CM | POA: Diagnosis not present

## 2015-02-07 DIAGNOSIS — R296 Repeated falls: Secondary | ICD-10-CM | POA: Diagnosis present

## 2015-02-07 HISTORY — DX: Benign prostatic hyperplasia without lower urinary tract symptoms: N40.0

## 2015-02-07 HISTORY — DX: Frequency of micturition: R35.0

## 2015-02-07 HISTORY — DX: Gastro-esophageal reflux disease without esophagitis: K21.9

## 2015-02-07 HISTORY — DX: Other complications of anesthesia, initial encounter: T88.59XA

## 2015-02-07 HISTORY — DX: Unspecified urinary incontinence: R32

## 2015-02-07 HISTORY — DX: Adverse effect of unspecified anesthetic, initial encounter: T41.45XA

## 2015-02-07 HISTORY — DX: Cardiac arrhythmia, unspecified: I49.9

## 2015-02-07 HISTORY — DX: Atherosclerotic heart disease of native coronary artery without angina pectoris: I25.10

## 2015-02-07 HISTORY — DX: Failed or difficult intubation, initial encounter: T88.4XXA

## 2015-02-07 HISTORY — DX: Reserved for inherently not codable concepts without codable children: IMO0001

## 2015-02-07 LAB — COMPREHENSIVE METABOLIC PANEL
ALT: 17 U/L (ref 17–63)
AST: 29 U/L (ref 15–41)
Albumin: 3.9 g/dL (ref 3.5–5.0)
Alkaline Phosphatase: 86 U/L (ref 38–126)
Anion gap: 11 (ref 5–15)
BUN: 24 mg/dL — ABNORMAL HIGH (ref 6–20)
CO2: 22 mmol/L (ref 22–32)
Calcium: 9.2 mg/dL (ref 8.9–10.3)
Chloride: 105 mmol/L (ref 101–111)
Creatinine, Ser: 2.03 mg/dL — ABNORMAL HIGH (ref 0.61–1.24)
GFR calc Af Amer: 34 mL/min — ABNORMAL LOW (ref >60)
GFR calc non Af Amer: 29 mL/min — ABNORMAL LOW (ref >60)
Glucose, Bld: 93 mg/dL (ref 65–99)
Potassium: 4.3 mmol/L (ref 3.5–5.1)
Sodium: 138 mmol/L (ref 135–145)
Total Bilirubin: 0.5 mg/dL (ref 0.3–1.2)
Total Protein: 7.5 g/dL (ref 6.5–8.1)

## 2015-02-07 LAB — TYPE AND SCREEN
ABO/RH(D): O NEG
Antibody Screen: NEGATIVE

## 2015-02-07 LAB — BLOOD GAS, ARTERIAL
Acid-base deficit: 0.2 mmol/L (ref 0.0–2.0)
Bicarbonate: 23.7 mEq/L (ref 20.0–24.0)
Drawn by: 206361
FIO2: 0.21 %
O2 Saturation: 96.2 %
Patient temperature: 98.6
TCO2: 24.8 mmol/L (ref 0–100)
pCO2 arterial: 37.2 mmHg (ref 35.0–45.0)
pH, Arterial: 7.42 (ref 7.350–7.450)
pO2, Arterial: 81.4 mmHg (ref 80.0–100.0)

## 2015-02-07 LAB — URINALYSIS, ROUTINE W REFLEX MICROSCOPIC
Bilirubin Urine: NEGATIVE
Glucose, UA: NEGATIVE mg/dL
Hgb urine dipstick: NEGATIVE
Ketones, ur: NEGATIVE mg/dL
Leukocytes, UA: NEGATIVE
Nitrite: NEGATIVE
Protein, ur: NEGATIVE mg/dL
Specific Gravity, Urine: 1.012 (ref 1.005–1.030)
Urobilinogen, UA: 0.2 mg/dL (ref 0.0–1.0)
pH: 5 (ref 5.0–8.0)

## 2015-02-07 LAB — CBC
HCT: 43.5 % (ref 39.0–52.0)
Hemoglobin: 14.6 g/dL (ref 13.0–17.0)
MCH: 30.6 pg (ref 26.0–34.0)
MCHC: 33.6 g/dL (ref 30.0–36.0)
MCV: 91.2 fL (ref 78.0–100.0)
Platelets: 203 10*3/uL (ref 150–400)
RBC: 4.77 MIL/uL (ref 4.22–5.81)
RDW: 13.8 % (ref 11.5–15.5)
WBC: 9.1 10*3/uL (ref 4.0–10.5)

## 2015-02-07 LAB — SURGICAL PCR SCREEN
MRSA, PCR: POSITIVE — AB
Staphylococcus aureus: POSITIVE — AB

## 2015-02-07 LAB — PROTIME-INR
INR: 1.23 (ref 0.00–1.49)
Prothrombin Time: 15.6 seconds — ABNORMAL HIGH (ref 11.6–15.2)

## 2015-02-07 LAB — APTT: aPTT: 39 seconds — ABNORMAL HIGH (ref 24–37)

## 2015-02-07 MED ORDER — CHLORHEXIDINE GLUCONATE 4 % EX LIQD: 30.0000 mL | CUTANEOUS | Status: DC

## 2015-02-07 NOTE — Pre-Procedure Instructions (Signed)
    [name] [name]  02/07/2015      [id number] [address] [place] [address] Phone: [phone] Fax: [phone]    Your procedure is scheduled on Tuesday, June 14th, 2016 at 7:30 AM.  Report to [facility] Admitting at 5:30 A.M.  Call this number if you have problems the morning of surgery:  [phone]   Remember:  Do not eat food or drink liquids after midnight.   Take these medicines the morning of surgery with A SIP OF WATER: Tylenol, all inhalers, Amlodipine (Norvasc), Wellbutrin, Buspirone (buspar), Clonazepam (Klonopin), Digoxin (Lanoxin), Diltiazem (Tiazac), Dutazteride (Avodart), Lexapro, Flonase, Synthroid, Lamictal.   Stop taking NSAIDS.    Do not wear jewelry, make-up or nail polish.  Do not wear lotions, powders, or perfumes.  You may wear deodorant.  Do not shave 48 hours prior to surgery.  Men may shave face and neck.  Do not bring valuables to the hospital.  [facility] is not responsible for any belongings or valuables.  Contacts, dentures or bridgework may not be worn into surgery.  Leave your suitcase in the car.  After surgery it may be brought to your room.  For patients admitted to the hospital, discharge time will be determined by your treatment team.  Patients discharged the day of surgery will not be allowed to drive home.   Special instructions:  See attached.   Please read over the following fact sheets that you were given. Pain Booklet, Coughing and Deep Breathing, Blood Transfusion Information, MRSA Information and Surgical Site Infection Prevention

## 2015-02-07 NOTE — Progress Notes (Signed)
I called a prescription for [facility]int[facility] [address], [place], [place]

## 2015-02-07 NOTE — Progress Notes (Signed)
VASCULAR LAB PRELIMINARY  PRELIMINARY  PRELIMINARY  PRELIMINARY  Pre-op Cardiac Surgery  Carotid Findings:  Right:  40-59% ICA stenosis.  Left:  ICA surgically removed per the patient.  Bilateral:  Vertebral artery flow is antegrade.  Upper Extremity Right Left  Brachial Pressures 147 triphasic 141 triphasic  Radial Waveforms triphasic triphasic  Ulnar Waveforms triphasic triphasic  Palmar Arch (Allen's Test) WNL WNL   Findings:  Doppler waveforms remain normal with ulnar and radial compressions bilaterally.     Alesia Banda, RVT 02/07/2015, 4:10 PM

## 2015-02-08 LAB — HEMOGLOBIN A1C
Hgb A1c MFr Bld: 6.2 % — ABNORMAL HIGH (ref 4.8–5.6)
Mean Plasma Glucose: 131 mg/dL

## 2015-02-10 ENCOUNTER — Encounter (HOSPITAL_COMMUNITY): Payer: Self-pay

## 2015-02-10 MED ORDER — DOPAMINE-DEXTROSE 3.2-5 MG/ML-% IV SOLN
0.0000 ug/kg/min | INTRAVENOUS | Status: AC
  Administered 2015-02-11: 3 ug/kg/min via INTRAVENOUS
  Filled 2015-02-10: qty 250

## 2015-02-10 MED ORDER — EPINEPHRINE HCL 1 MG/ML IJ SOLN
0.0000 ug/min | INTRAVENOUS | Status: DC
  Filled 2015-02-10: qty 4

## 2015-02-10 MED ORDER — PLASMA-LYTE 148 IV SOLN
INTRAVENOUS | Status: AC
  Administered 2015-02-11: 500 mL
  Filled 2015-02-10: qty 2.5

## 2015-02-10 MED ORDER — NITROGLYCERIN IN D5W 200-5 MCG/ML-% IV SOLN
2.0000 ug/min | INTRAVENOUS | Status: AC
  Administered 2015-02-11: 5 ug/min via INTRAVENOUS
  Filled 2015-02-10: qty 250

## 2015-02-10 MED ORDER — POTASSIUM CHLORIDE 2 MEQ/ML IV SOLN
80.0000 meq | INTRAVENOUS | Status: DC
  Filled 2015-02-10: qty 40

## 2015-02-10 MED ORDER — MAGNESIUM SULFATE 50 % IJ SOLN
40.0000 meq | INTRAMUSCULAR | Status: DC
  Filled 2015-02-10: qty 10

## 2015-02-10 MED ORDER — SODIUM CHLORIDE 0.9 % IV SOLN
INTRAVENOUS | Status: DC
  Filled 2015-02-10: qty 40

## 2015-02-10 MED ORDER — DEXTROSE 5 % IV SOLN
750.0000 mg | INTRAVENOUS | Status: DC
  Filled 2015-02-10: qty 750

## 2015-02-10 MED ORDER — PHENYLEPHRINE HCL 10 MG/ML IJ SOLN
30.0000 ug/min | INTRAVENOUS | Status: DC
  Administered 2015-02-11: 20 ug/min via INTRAVENOUS
  Filled 2015-02-10: qty 2

## 2015-02-10 MED ORDER — SODIUM CHLORIDE 0.9 % IV SOLN
INTRAVENOUS | Status: DC
  Administered 2015-02-11: 4.6 [IU]/h via INTRAVENOUS
  Filled 2015-02-10: qty 2.5

## 2015-02-10 MED ORDER — DEXMEDETOMIDINE HCL IN NACL 400 MCG/100ML IV SOLN
0.1000 ug/kg/h | INTRAVENOUS | Status: AC
  Administered 2015-02-11: .4 ug/kg/h via INTRAVENOUS
  Filled 2015-02-10: qty 100

## 2015-02-10 MED ORDER — DEXTROSE 5 % IV SOLN
1.5000 g | INTRAVENOUS | Status: AC
  Administered 2015-02-11: .75 g via INTRAVENOUS
  Administered 2015-02-11: 1.5 g via INTRAVENOUS
  Filled 2015-02-10 (×2): qty 1.5

## 2015-02-10 MED ORDER — HEPARIN SODIUM (PORCINE) 1000 UNIT/ML IJ SOLN
INTRAMUSCULAR | Status: DC
  Filled 2015-02-10: qty 30

## 2015-02-10 MED ORDER — VANCOMYCIN HCL 10 G IV SOLR
1500.0000 mg | INTRAVENOUS | Status: AC
  Administered 2015-02-11: 1500 mg via INTRAVENOUS
  Filled 2015-02-10: qty 1500

## 2015-02-10 NOTE — Progress Notes (Signed)
Anesthesia Chart Review:  Patient is a 81 year old male scheduled for AVR, CABG, atrial clipping 02/11/15 by Dr. Tyrone Sage.  History includes CAD, severe AI, afib, CKD, OSA, COPD, AAA open repair '07 (Dr. Waverly Ferrari), BLE DVT ~ 2013, hypothyroidism, HLD, brain surgery '60's (left circle of Willis aneurysm clip noted on 10/15/14 head CT), anxiety, BPH, GERD, former smoker, HTN, nasal surgery cholecystectomy 09/16/09, [facility] 03/27/10 and ERCP 12/03/09.   By notes, patient thought he may have had a previous anesthesia complication but couldn't provide details. I looked back at his last anesthesia records at [facility] from 12/03/09 and 03/27/10. Records can be found scanned in under the Notes tab, however, I did print the anesthesia records and placed them on patient's chart for review. On 12/03/09, it took three attempt to successfully intubate patient--failed attempt with bougie, then used Miller 3 (8mm ETT). On 03/27/10 there was successful intubation on second attempt using glidescope #4 (7mm ETT). For both procedures, the anesthesiologist did not comment on any future recommendations.   PCP is Dr. Cain Saupe. Local cardiologist is Dr. Kristeen Miss, but was previously being followed at [facility] (Dr. Malissa Hippo, see Care Everywhere). By notes, he sees a nephrologist is in [place].  Med include Eliquis (on hold), lisinopril, levothyroxine, Lamictal, Flonase, Eszopiclone, Lexapro, Avodart, diltiazem, digoxin, clonazepam, Buspar, Wellbutrin XL, Lipitor, amlodipine, albuterol.  02/07/15 EKG: Afib at 67 bpm, ST/T wave abnormality, consider inferolateral ischemia.  09/20/14 Cardiac cath: RHC/LHC on 09/20/2014 (scanned under Results Review tab): RA 11 RV 50/13 PA 47/25 (33) PCWP 18 CI 1.6 PVR 4.0 and a 70% distal RCA lesion. Insignificant LAD. Normal LM and CX.  Right dominant.  09/18/14 Echo TTE on 09/18/2014 (Care Everywhere):LVEF 50%, LVIDs 4.4 cm, severe AR, trivial TR.  10/15/14 Carotid duplex:  Summary: Right: 40-59% ICA stenosis by systolic velocities, probably due to vessel tortuosity. The ICA/CCA ratio is in the 60-79% range but plaque morphology does not support this. Left: CCA and ICA not insonated. Bilateral: Vertebral artery flow is antegrade.  05/12/14 Abdominal aortic duplex: AAA present involving the mid segment proximal to the tube graft anastomosis, measuring 3.53 AP X 3.64 cm TRV, no intramural thrombus.   09/19/14 PFTs (Care Everywhere):     PRE    POST (N/A)     Ref   Best  %Pred  FVC Liters   4.87  4.16    85 FEV1 Liters   3.73  2.94  79 FEV1/FVC %   76  71 FEF25-75% L/sec  3.28  1.94   59 PEF L/sec   8.81  7.55  86 MVV L/min   137  97  71 Spiro Tech Comment: Patient effort and cooperation good. Cleda Daub Interp: The flow volume loop and the isolated reduction in the expiratory flows at low lung volumes are consistant with small airway disease. Formal lung volume measurements not available but a normal spirometric vital capacity argues against significant restriction. The maximum voluntary ventilaton is normal.  02/07/15 CXR: No evidence of acute cardiopulmonary disease.  Preoperative labs noted. BUN 24, Cr 2.03 up from 10/1.61 on 10/18/14 (Cr range since 05/2014 1.54 - 1.99). PT 15.6, INR 1.23, PTT 39, A1C 6.2, CBC WNL. Patient with known CKD, but recent result seems higher then his baseline--or at least at the top of his usual baseline. I have called result to TCTS RN Ryan.  She will review with Dr. Tyrone Sage for additional recommendations. I will defer to him whether to repeat pre-operatively versus follow closely  post-operatively.  Velna Ochs [facility] Short Stay Center/Anesthesiology Phone [phone] 02/10/2015 10:42 AM

## 2015-02-11 ENCOUNTER — Inpatient Hospital Stay (HOSPITAL_COMMUNITY): Payer: Medicare Other | Admitting: Certified Registered"

## 2015-02-11 ENCOUNTER — Inpatient Hospital Stay (HOSPITAL_COMMUNITY): Payer: Medicare Other

## 2015-02-11 ENCOUNTER — Encounter (HOSPITAL_COMMUNITY): Payer: Self-pay | Admitting: *Deleted

## 2015-02-11 ENCOUNTER — Encounter (HOSPITAL_COMMUNITY)
Admission: RE | Disposition: A | Discharge status: Rehabilitation Facility | Payer: Medicare Other | Source: Ambulatory Visit | Attending: Cardiothoracic Surgery

## 2015-02-11 ENCOUNTER — Inpatient Hospital Stay (HOSPITAL_COMMUNITY): Payer: Medicare Other | Admitting: Emergency Medicine

## 2015-02-11 ENCOUNTER — Inpatient Hospital Stay (HOSPITAL_COMMUNITY)
Admission: RE | Admit: 2015-02-11 | Discharge: 2015-02-20 | DRG: 219 | Disposition: A | Discharge status: Rehabilitation Facility | Payer: Medicare Other | Source: Ambulatory Visit | Attending: Cardiothoracic Surgery | Admitting: Cardiothoracic Surgery

## 2015-02-11 DIAGNOSIS — N400 Enlarged prostate without lower urinary tract symptoms: Secondary | ICD-10-CM | POA: Diagnosis present

## 2015-02-11 DIAGNOSIS — I482 Chronic atrial fibrillation: Secondary | ICD-10-CM | POA: Diagnosis not present

## 2015-02-11 DIAGNOSIS — R0602 Shortness of breath: Secondary | ICD-10-CM

## 2015-02-11 DIAGNOSIS — K859 Acute pancreatitis without necrosis or infection, unspecified: Secondary | ICD-10-CM

## 2015-02-11 DIAGNOSIS — E039 Hypothyroidism, unspecified: Secondary | ICD-10-CM | POA: Diagnosis present

## 2015-02-11 DIAGNOSIS — Z8249 Family history of ischemic heart disease and other diseases of the circulatory system: Secondary | ICD-10-CM

## 2015-02-11 DIAGNOSIS — K219 Gastro-esophageal reflux disease without esophagitis: Secondary | ICD-10-CM | POA: Diagnosis present

## 2015-02-11 DIAGNOSIS — I129 Hypertensive chronic kidney disease with stage 1 through stage 4 chronic kidney disease, or unspecified chronic kidney disease: Secondary | ICD-10-CM | POA: Diagnosis present

## 2015-02-11 DIAGNOSIS — F419 Anxiety disorder, unspecified: Secondary | ICD-10-CM | POA: Diagnosis present

## 2015-02-11 DIAGNOSIS — I5033 Acute on chronic diastolic (congestive) heart failure: Secondary | ICD-10-CM | POA: Diagnosis present

## 2015-02-11 DIAGNOSIS — I351 Nonrheumatic aortic (valve) insufficiency: Principal | ICD-10-CM | POA: Diagnosis present

## 2015-02-11 DIAGNOSIS — J449 Chronic obstructive pulmonary disease, unspecified: Secondary | ICD-10-CM | POA: Diagnosis present

## 2015-02-11 DIAGNOSIS — I2510 Atherosclerotic heart disease of native coronary artery without angina pectoris: Secondary | ICD-10-CM | POA: Diagnosis not present

## 2015-02-11 DIAGNOSIS — F329 Major depressive disorder, single episode, unspecified: Secondary | ICD-10-CM | POA: Diagnosis present

## 2015-02-11 DIAGNOSIS — N183 Chronic kidney disease, stage 3 (moderate): Secondary | ICD-10-CM | POA: Diagnosis present

## 2015-02-11 DIAGNOSIS — E780 Pure hypercholesterolemia: Secondary | ICD-10-CM | POA: Diagnosis present

## 2015-02-11 DIAGNOSIS — G4700 Insomnia, unspecified: Secondary | ICD-10-CM | POA: Diagnosis present

## 2015-02-11 DIAGNOSIS — Z7901 Long term (current) use of anticoagulants: Secondary | ICD-10-CM

## 2015-02-11 DIAGNOSIS — D62 Acute posthemorrhagic anemia: Secondary | ICD-10-CM | POA: Diagnosis not present

## 2015-02-11 DIAGNOSIS — K760 Fatty (change of) liver, not elsewhere classified: Secondary | ICD-10-CM | POA: Diagnosis present

## 2015-02-11 DIAGNOSIS — R531 Weakness: Secondary | ICD-10-CM | POA: Diagnosis not present

## 2015-02-11 DIAGNOSIS — J9811 Atelectasis: Secondary | ICD-10-CM | POA: Diagnosis not present

## 2015-02-11 DIAGNOSIS — Z952 Presence of prosthetic heart valve: Secondary | ICD-10-CM

## 2015-02-11 DIAGNOSIS — D696 Thrombocytopenia, unspecified: Secondary | ICD-10-CM | POA: Diagnosis not present

## 2015-02-11 DIAGNOSIS — R5381 Other malaise: Secondary | ICD-10-CM | POA: Diagnosis not present

## 2015-02-11 DIAGNOSIS — Z79899 Other long term (current) drug therapy: Secondary | ICD-10-CM | POA: Diagnosis not present

## 2015-02-11 DIAGNOSIS — Z9089 Acquired absence of other organs: Secondary | ICD-10-CM | POA: Diagnosis not present

## 2015-02-11 DIAGNOSIS — I358 Other nonrheumatic aortic valve disorders: Secondary | ICD-10-CM | POA: Diagnosis not present

## 2015-02-11 DIAGNOSIS — Z86718 Personal history of other venous thrombosis and embolism: Secondary | ICD-10-CM | POA: Diagnosis not present

## 2015-02-11 DIAGNOSIS — Z87891 Personal history of nicotine dependence: Secondary | ICD-10-CM | POA: Diagnosis not present

## 2015-02-11 DIAGNOSIS — J90 Pleural effusion, not elsewhere classified: Secondary | ICD-10-CM | POA: Diagnosis not present

## 2015-02-11 DIAGNOSIS — N390 Urinary tract infection, site not specified: Secondary | ICD-10-CM | POA: Diagnosis not present

## 2015-02-11 DIAGNOSIS — G4733 Obstructive sleep apnea (adult) (pediatric): Secondary | ICD-10-CM | POA: Diagnosis present

## 2015-02-11 DIAGNOSIS — G9340 Encephalopathy, unspecified: Secondary | ICD-10-CM | POA: Diagnosis not present

## 2015-02-11 DIAGNOSIS — I4891 Unspecified atrial fibrillation: Secondary | ICD-10-CM | POA: Diagnosis not present

## 2015-02-11 DIAGNOSIS — Z954 Presence of other heart-valve replacement: Secondary | ICD-10-CM | POA: Diagnosis not present

## 2015-02-11 DIAGNOSIS — R296 Repeated falls: Secondary | ICD-10-CM | POA: Diagnosis present

## 2015-02-11 DIAGNOSIS — I350 Nonrheumatic aortic (valve) stenosis: Secondary | ICD-10-CM

## 2015-02-11 HISTORY — PX: CLIPPING OF ATRIAL APPENDAGE: SHX5773

## 2015-02-11 HISTORY — PX: TEE WITHOUT CARDIOVERSION: SHX5443

## 2015-02-11 HISTORY — PX: AORTIC VALVE REPLACEMENT: SHX41

## 2015-02-11 LAB — POCT I-STAT 3, ART BLOOD GAS (G3+)
Acid-base deficit: 6 mmol/L — ABNORMAL HIGH (ref 0.0–2.0)
Acid-base deficit: 4 mmol/L — ABNORMAL HIGH (ref 0.0–2.0)
Acid-base deficit: 2 mmol/L (ref 0.0–2.0)
Acid-base deficit: 1 mmol/L (ref 0.0–2.0)
Acid-base deficit: 3 mmol/L — ABNORMAL HIGH (ref 0.0–2.0)
Acid-base deficit: 3 mmol/L — ABNORMAL HIGH (ref 0.0–2.0)
Bicarbonate: 21.1 mEq/L (ref 20.0–24.0)
Bicarbonate: 22.3 mEq/L (ref 20.0–24.0)
Bicarbonate: 23.6 mEq/L (ref 20.0–24.0)
Bicarbonate: 24.9 mEq/L — ABNORMAL HIGH (ref 20.0–24.0)
Bicarbonate: 23.6 mEq/L (ref 20.0–24.0)
Bicarbonate: 23.6 mEq/L (ref 20.0–24.0)
O2 Saturation: 97 %
O2 Saturation: 87 %
O2 Saturation: 88 %
O2 Saturation: 92 %
O2 Saturation: 90 %
O2 Saturation: 90 %
Patient temperature: 35.6
Patient temperature: 36.8
Patient temperature: 37.4
Patient temperature: 37.5
Patient temperature: 37.5
TCO2: 22 mmol/L (ref 0–100)
TCO2: 24 mmol/L (ref 0–100)
TCO2: 25 mmol/L (ref 0–100)
TCO2: 26 mmol/L (ref 0–100)
TCO2: 25 mmol/L (ref 0–100)
TCO2: 25 mmol/L (ref 0–100)
pCO2 arterial: 45 mmHg (ref 35.0–45.0)
pCO2 arterial: 40.4 mmHg (ref 35.0–45.0)
pCO2 arterial: 42.9 mmHg (ref 35.0–45.0)
pCO2 arterial: 44.5 mmHg (ref 35.0–45.0)
pCO2 arterial: 48.4 mmHg — ABNORMAL HIGH (ref 35.0–45.0)
pCO2 arterial: 46.7 mmHg — ABNORMAL HIGH (ref 35.0–45.0)
pH, Arterial: 7.279 — ABNORMAL LOW (ref 7.350–7.450)
pH, Arterial: 7.343 — ABNORMAL LOW (ref 7.350–7.450)
pH, Arterial: 7.348 — ABNORMAL LOW (ref 7.350–7.450)
pH, Arterial: 7.358 (ref 7.350–7.450)
pH, Arterial: 7.298 — ABNORMAL LOW (ref 7.350–7.450)
pH, Arterial: 7.313 — ABNORMAL LOW (ref 7.350–7.450)
pO2, Arterial: 101 mmHg — ABNORMAL HIGH (ref 80.0–100.0)
pO2, Arterial: 53 mmHg — ABNORMAL LOW (ref 80.0–100.0)
pO2, Arterial: 58 mmHg — ABNORMAL LOW (ref 80.0–100.0)
pO2, Arterial: 68 mmHg — ABNORMAL LOW (ref 80.0–100.0)
pO2, Arterial: 68 mmHg — ABNORMAL LOW (ref 80.0–100.0)
pO2, Arterial: 66 mmHg — ABNORMAL LOW (ref 80.0–100.0)

## 2015-02-11 LAB — CBC
HCT: 32.5 % — ABNORMAL LOW (ref 39.0–52.0)
HEMATOCRIT: 37.8 % — AB (ref 39.0–52.0)
Hemoglobin: 12.5 g/dL — ABNORMAL LOW (ref 13.0–17.0)
Hemoglobin: 10.8 g/dL — ABNORMAL LOW (ref 13.0–17.0)
MCH: 30.1 pg (ref 26.0–34.0)
MCH: 30.3 pg (ref 26.0–34.0)
MCHC: 33.1 g/dL (ref 30.0–36.0)
MCHC: 33.2 g/dL (ref 30.0–36.0)
MCV: 91.1 fL (ref 78.0–100.0)
MCV: 91.3 fL (ref 78.0–100.0)
Platelets: 142 10*3/uL — ABNORMAL LOW (ref 150–400)
Platelets: 120 10*3/uL — ABNORMAL LOW (ref 150–400)
RBC: 4.15 MIL/uL — ABNORMAL LOW (ref 4.22–5.81)
RBC: 3.56 MIL/uL — ABNORMAL LOW (ref 4.22–5.81)
RDW: 14.1 % (ref 11.5–15.5)
RDW: 14 % (ref 11.5–15.5)
WBC: 14.7 10*3/uL — ABNORMAL HIGH (ref 4.0–10.5)
WBC: 12.8 10*3/uL — ABNORMAL HIGH (ref 4.0–10.5)

## 2015-02-11 LAB — POCT I-STAT, CHEM 8
BUN: 28 mg/dL — ABNORMAL HIGH (ref 6–20)
BUN: 25 mg/dL — ABNORMAL HIGH (ref 6–20)
BUN: 27 mg/dL — ABNORMAL HIGH (ref 6–20)
BUN: 25 mg/dL — ABNORMAL HIGH (ref 6–20)
BUN: 25 mg/dL — ABNORMAL HIGH (ref 6–20)
Calcium, Ion: 1.22 mmol/L (ref 1.13–1.30)
Calcium, Ion: 1.01 mmol/L — ABNORMAL LOW (ref 1.13–1.30)
Calcium, Ion: 1.09 mmol/L — ABNORMAL LOW (ref 1.13–1.30)
Calcium, Ion: 1.07 mmol/L — ABNORMAL LOW (ref 1.13–1.30)
Calcium, Ion: 1.12 mmol/L — ABNORMAL LOW (ref 1.13–1.30)
Chloride: 104 mmol/L (ref 101–111)
Chloride: 100 mmol/L — ABNORMAL LOW (ref 101–111)
Chloride: 101 mmol/L (ref 101–111)
Chloride: 101 mmol/L (ref 101–111)
Chloride: 104 mmol/L (ref 101–111)
Creatinine, Ser: 1.7 mg/dL — ABNORMAL HIGH (ref 0.61–1.24)
Creatinine, Ser: 1.5 mg/dL — ABNORMAL HIGH (ref 0.61–1.24)
Creatinine, Ser: 1.6 mg/dL — ABNORMAL HIGH (ref 0.61–1.24)
Creatinine, Ser: 1.5 mg/dL — ABNORMAL HIGH (ref 0.61–1.24)
Creatinine, Ser: 1.6 mg/dL — ABNORMAL HIGH (ref 0.61–1.24)
Glucose, Bld: 122 mg/dL — ABNORMAL HIGH (ref 65–99)
Glucose, Bld: 113 mg/dL — ABNORMAL HIGH (ref 65–99)
Glucose, Bld: 159 mg/dL — ABNORMAL HIGH (ref 65–99)
Glucose, Bld: 197 mg/dL — ABNORMAL HIGH (ref 65–99)
Glucose, Bld: 137 mg/dL — ABNORMAL HIGH (ref 65–99)
HCT: 36 % — ABNORMAL LOW (ref 39.0–52.0)
HCT: 28 % — ABNORMAL LOW (ref 39.0–52.0)
HCT: 33 % — ABNORMAL LOW (ref 39.0–52.0)
HCT: 33 % — ABNORMAL LOW (ref 39.0–52.0)
HCT: 32 % — ABNORMAL LOW (ref 39.0–52.0)
Hemoglobin: 12.2 g/dL — ABNORMAL LOW (ref 13.0–17.0)
Hemoglobin: 11.2 g/dL — ABNORMAL LOW (ref 13.0–17.0)
Hemoglobin: 9.5 g/dL — ABNORMAL LOW (ref 13.0–17.0)
Hemoglobin: 11.2 g/dL — ABNORMAL LOW (ref 13.0–17.0)
Hemoglobin: 10.9 g/dL — ABNORMAL LOW (ref 13.0–17.0)
Potassium: 4.2 mmol/L (ref 3.5–5.1)
Potassium: 3.9 mmol/L (ref 3.5–5.1)
Potassium: 4.5 mmol/L (ref 3.5–5.1)
Potassium: 4.5 mmol/L (ref 3.5–5.1)
Potassium: 4 mmol/L (ref 3.5–5.1)
Sodium: 138 mmol/L (ref 135–145)
Sodium: 136 mmol/L (ref 135–145)
Sodium: 139 mmol/L (ref 135–145)
Sodium: 137 mmol/L (ref 135–145)
Sodium: 140 mmol/L (ref 135–145)
TCO2: 22 mmol/L (ref 0–100)
TCO2: 22 mmol/L (ref 0–100)
TCO2: 22 mmol/L (ref 0–100)
TCO2: 17 mmol/L (ref 0–100)
TCO2: 22 mmol/L (ref 0–100)

## 2015-02-11 LAB — CREATININE, SERUM
Creatinine, Ser: 1.63 mg/dL — ABNORMAL HIGH (ref 0.61–1.24)
GFR calc Af Amer: 44 mL/min — ABNORMAL LOW (ref >60)
GFR calc non Af Amer: 38 mL/min — ABNORMAL LOW (ref >60)

## 2015-02-11 LAB — POCT I-STAT 4, (NA,K, GLUC, HGB,HCT)
Glucose, Bld: 151 mg/dL — ABNORMAL HIGH (ref 65–99)
HCT: 39 % (ref 39.0–52.0)
Hemoglobin: 13.3 g/dL (ref 13.0–17.0)
Potassium: 4 mmol/L (ref 3.5–5.1)
Sodium: 141 mmol/L (ref 135–145)

## 2015-02-11 LAB — GLUCOSE, CAPILLARY
Glucose-Capillary: 135 mg/dL — ABNORMAL HIGH (ref 65–99)
Glucose-Capillary: 131 mg/dL — ABNORMAL HIGH (ref 65–99)
Glucose-Capillary: 136 mg/dL — ABNORMAL HIGH (ref 65–99)
Glucose-Capillary: 132 mg/dL — ABNORMAL HIGH (ref 65–99)
Glucose-Capillary: 119 mg/dL — ABNORMAL HIGH (ref 65–99)
Glucose-Capillary: 135 mg/dL — ABNORMAL HIGH (ref 65–99)
Glucose-Capillary: 160 mg/dL — ABNORMAL HIGH (ref 65–99)
Glucose-Capillary: 138 mg/dL — ABNORMAL HIGH (ref 65–99)
Glucose-Capillary: 130 mg/dL — ABNORMAL HIGH (ref 65–99)

## 2015-02-11 LAB — MAGNESIUM: Magnesium: 2.7 mg/dL — ABNORMAL HIGH (ref 1.7–2.4)

## 2015-02-11 LAB — PROTIME-INR
INR: 1.5 — AB (ref 0.00–1.49)
PROTHROMBIN TIME: 18.2 s — AB (ref 11.6–15.2)

## 2015-02-11 LAB — POCT I-STAT GLUCOSE
Glucose, Bld: 121 mg/dL — ABNORMAL HIGH (ref 65–99)
Operator id: 3402

## 2015-02-11 LAB — PLATELET COUNT: Platelets: 161 10*3/uL (ref 150–400)

## 2015-02-11 LAB — HEMOGLOBIN AND HEMATOCRIT, BLOOD
HCT: 32.8 % — ABNORMAL LOW (ref 39.0–52.0)
Hemoglobin: 10.9 g/dL — ABNORMAL LOW (ref 13.0–17.0)

## 2015-02-11 LAB — APTT: aPTT: 35 seconds (ref 24–37)

## 2015-02-11 SURGERY — REPLACEMENT, AORTIC VALVE, OPEN
Anesthesia: General | Site: Chest

## 2015-02-11 MED ORDER — CHLORHEXIDINE GLUCONATE CLOTH 2 % EX PADS: 6.0000 | MEDICATED_PAD | Freq: Every day | CUTANEOUS | Status: DC

## 2015-02-11 MED ORDER — ATORVASTATIN CALCIUM 10 MG PO TABS
10.0000 mg | ORAL_TABLET | Freq: Every day | ORAL | Status: DC
  Administered 2015-02-12 – 2015-02-20 (×9): 10 mg via ORAL
  Filled 2015-02-11 (×10): qty 1

## 2015-02-11 MED ORDER — LACTATED RINGERS IV SOLN: 500.0000 mL | Freq: Once | INTRAVENOUS | Status: AC | PRN

## 2015-02-11 MED ORDER — LACTATED RINGERS IV SOLN
INTRAVENOUS | Status: DC
  Administered 2015-02-13: 01:00:00 via INTRAVENOUS

## 2015-02-11 MED ORDER — ACETAMINOPHEN 160 MG/5ML PO SOLN
1000.0000 mg | Freq: Four times a day (QID) | ORAL | Status: DC
  Filled 2015-02-11: qty 40

## 2015-02-11 MED ORDER — MIDAZOLAM HCL 2 MG/2ML IJ SOLN: 2.0000 mg | INTRAMUSCULAR | Status: DC | PRN

## 2015-02-11 MED ORDER — INSULIN REGULAR BOLUS VIA INFUSION
0.0000 [IU] | Freq: Three times a day (TID) | INTRAVENOUS | Status: DC
  Filled 2015-02-11: qty 10

## 2015-02-11 MED ORDER — ALBUMIN HUMAN 5 % IV SOLN
250.0000 mL | INTRAVENOUS | Status: AC | PRN
  Administered 2015-02-11 (×3): 250 mL via INTRAVENOUS
  Filled 2015-02-11: qty 250

## 2015-02-11 MED ORDER — INSULIN REGULAR HUMAN 100 UNIT/ML IJ SOLN
250.0000 [IU] | INTRAMUSCULAR | Status: DC | PRN
  Administered 2015-02-11: .9 [IU]/h via INTRAVENOUS

## 2015-02-11 MED ORDER — HEMOSTATIC AGENTS (NO CHARGE) OPTIME
TOPICAL | Status: DC | PRN
  Administered 2015-02-11: 1 via TOPICAL

## 2015-02-11 MED ORDER — BUPROPION HCL ER (XL) 300 MG PO TB24
300.0000 mg | ORAL_TABLET | Freq: Every day | ORAL | Status: DC
  Administered 2015-02-12 – 2015-02-20 (×9): 300 mg via ORAL
  Filled 2015-02-11 (×9): qty 1

## 2015-02-11 MED ORDER — PHENYLEPHRINE HCL 10 MG/ML IJ SOLN
0.0000 ug/min | INTRAVENOUS | Status: DC
  Administered 2015-02-12: 20 ug/min via INTRAVENOUS
  Filled 2015-02-11 (×2): qty 2

## 2015-02-11 MED ORDER — MUPIROCIN 2 % EX OINT
TOPICAL_OINTMENT | Freq: Two times a day (BID) | CUTANEOUS | Status: AC
  Administered 2015-02-11 – 2015-02-16 (×10): via NASAL
  Filled 2015-02-11 (×2): qty 22

## 2015-02-11 MED ORDER — MILRINONE IN DEXTROSE 20 MG/100ML IV SOLN
0.1250 ug/kg/min | INTRAVENOUS | Status: DC
  Administered 2015-02-11 (×2): 0.25 ug/kg/min via INTRAVENOUS
  Administered 2015-02-12: 0.125 ug/kg/min via INTRAVENOUS
  Filled 2015-02-11 (×3): qty 100

## 2015-02-11 MED ORDER — SODIUM CHLORIDE 0.9 % IV SOLN
INTRAVENOUS | Status: DC
  Administered 2015-02-11: 20:00:00 via INTRAVENOUS
  Filled 2015-02-11 (×2): qty 2.5

## 2015-02-11 MED ORDER — DOPAMINE-DEXTROSE 3.2-5 MG/ML-% IV SOLN
3.0000 ug/kg/min | INTRAVENOUS | Status: DC
  Administered 2015-02-11: 5 ug/kg/min via INTRAVENOUS
  Administered 2015-02-12: 3 ug/kg/min via INTRAVENOUS
  Filled 2015-02-11: qty 250

## 2015-02-11 MED ORDER — FAMOTIDINE IN NACL 20-0.9 MG/50ML-% IV SOLN
20.0000 mg | Freq: Two times a day (BID) | INTRAVENOUS | Status: AC
  Administered 2015-02-11: 20 mg via INTRAVENOUS

## 2015-02-11 MED ORDER — METOPROLOL TARTRATE 12.5 MG HALF TABLET
12.5000 mg | ORAL_TABLET | Freq: Once | ORAL | Status: AC
  Administered 2015-02-11: 12.5 mg via ORAL
  Filled 2015-02-11: qty 1

## 2015-02-11 MED ORDER — SODIUM BICARBONATE 8.4 % IV SOLN
INTRAVENOUS | Status: DC | PRN
  Administered 2015-02-11: 50 meq via INTRAVENOUS

## 2015-02-11 MED ORDER — ONDANSETRON HCL 4 MG/2ML IJ SOLN: 4.0000 mg | Freq: Four times a day (QID) | INTRAMUSCULAR | Status: DC | PRN

## 2015-02-11 MED ORDER — HEPARIN SODIUM (PORCINE) 1000 UNIT/ML IJ SOLN
INTRAMUSCULAR | Status: AC
  Filled 2015-02-11: qty 1

## 2015-02-11 MED ORDER — NITROGLYCERIN IN D5W 200-5 MCG/ML-% IV SOLN: 0.0000 ug/min | INTRAVENOUS | Status: DC

## 2015-02-11 MED ORDER — ACETAMINOPHEN 650 MG RE SUPP
650.0000 mg | Freq: Once | RECTAL | Status: AC
  Administered 2015-02-11: 650 mg via RECTAL

## 2015-02-11 MED ORDER — ACETAMINOPHEN 500 MG PO TABS
1000.0000 mg | ORAL_TABLET | Freq: Four times a day (QID) | ORAL | Status: DC
  Administered 2015-02-12 – 2015-02-16 (×19): 1000 mg via ORAL
  Filled 2015-02-11 (×22): qty 2

## 2015-02-11 MED ORDER — PROPOFOL 10 MG/ML IV BOLUS
INTRAVENOUS | Status: AC
  Filled 2015-02-11: qty 20

## 2015-02-11 MED ORDER — FLUTICASONE PROPIONATE 50 MCG/ACT NA SUSP
1.0000 | Freq: Every day | NASAL | Status: DC
  Administered 2015-02-14 – 2015-02-20 (×7): 1 via NASAL
  Filled 2015-02-11: qty 16

## 2015-02-11 MED ORDER — LAMOTRIGINE 100 MG PO TABS
100.0000 mg | ORAL_TABLET | Freq: Two times a day (BID) | ORAL | Status: DC
  Administered 2015-02-12 – 2015-02-20 (×17): 100 mg via ORAL
  Filled 2015-02-11 (×20): qty 1

## 2015-02-11 MED ORDER — PROTAMINE SULFATE 10 MG/ML IV SOLN
INTRAVENOUS | Status: DC | PRN
  Administered 2015-02-11: 350 mg via INTRAVENOUS

## 2015-02-11 MED ORDER — MIDAZOLAM HCL 5 MG/5ML IJ SOLN
INTRAMUSCULAR | Status: DC | PRN
  Administered 2015-02-11: 2 mg via INTRAVENOUS
  Administered 2015-02-11: 4 mg via INTRAVENOUS
  Administered 2015-02-11: 1 mg via INTRAVENOUS
  Administered 2015-02-11: 3 mg via INTRAVENOUS

## 2015-02-11 MED ORDER — CLONAZEPAM 0.5 MG PO TABS
0.5000 mg | ORAL_TABLET | Freq: Every day | ORAL | Status: DC
  Administered 2015-02-12 – 2015-02-19 (×8): 0.5 mg via ORAL
  Filled 2015-02-11 (×8): qty 1

## 2015-02-11 MED ORDER — VECURONIUM BROMIDE 10 MG IV SOLR
INTRAVENOUS | Status: DC | PRN
  Administered 2015-02-11: 3 mg via INTRAVENOUS
  Administered 2015-02-11: 10 mg via INTRAVENOUS
  Administered 2015-02-11 (×2): 2 mg via INTRAVENOUS

## 2015-02-11 MED ORDER — AMINOCAPROIC ACID 250 MG/ML IV SOLN
10.0000 g | INTRAVENOUS | Status: DC | PRN
  Administered 2015-02-11: 5 g/h via INTRAVENOUS

## 2015-02-11 MED ORDER — SODIUM CHLORIDE 0.9 % IJ SOLN: 3.0000 mL | INTRAMUSCULAR | Status: DC | PRN

## 2015-02-11 MED ORDER — CETYLPYRIDINIUM CHLORIDE 0.05 % MT LIQD
7.0000 mL | Freq: Two times a day (BID) | OROMUCOSAL | Status: DC
  Administered 2015-02-11 – 2015-02-13 (×5): 7 mL via OROMUCOSAL

## 2015-02-11 MED ORDER — METOPROLOL TARTRATE 25 MG/10 ML ORAL SUSPENSION
12.5000 mg | Freq: Two times a day (BID) | ORAL | Status: DC
  Filled 2015-02-11 (×5): qty 5

## 2015-02-11 MED ORDER — ASPIRIN 81 MG PO CHEW: 324.0000 mg | CHEWABLE_TABLET | Freq: Every day | ORAL | Status: DC

## 2015-02-11 MED ORDER — SODIUM CHLORIDE 0.9 % IV SOLN
INTRAVENOUS | Status: DC
  Administered 2015-02-12: 20 mL via INTRAVENOUS

## 2015-02-11 MED ORDER — FENTANYL CITRATE (PF) 250 MCG/5ML IJ SOLN
INTRAMUSCULAR | Status: AC
  Filled 2015-02-11: qty 5

## 2015-02-11 MED ORDER — PANTOPRAZOLE SODIUM 40 MG PO TBEC
40.0000 mg | DELAYED_RELEASE_TABLET | Freq: Every day | ORAL | Status: DC
  Administered 2015-02-13 – 2015-02-19 (×7): 40 mg via ORAL
  Filled 2015-02-11 (×7): qty 1

## 2015-02-11 MED ORDER — ASPIRIN EC 325 MG PO TBEC
325.0000 mg | DELAYED_RELEASE_TABLET | Freq: Every day | ORAL | Status: DC
  Filled 2015-02-11: qty 1

## 2015-02-11 MED ORDER — CHLORHEXIDINE GLUCONATE CLOTH 2 % EX PADS
6.0000 | MEDICATED_PAD | Freq: Every day | CUTANEOUS | Status: AC
  Administered 2015-02-11 – 2015-02-15 (×5): 6 via TOPICAL

## 2015-02-11 MED ORDER — VANCOMYCIN HCL IN DEXTROSE 1-5 GM/200ML-% IV SOLN
1000.0000 mg | Freq: Once | INTRAVENOUS | Status: AC
  Administered 2015-02-11: 1000 mg via INTRAVENOUS
  Filled 2015-02-11: qty 200

## 2015-02-11 MED ORDER — EPHEDRINE SULFATE 50 MG/ML IJ SOLN
INTRAMUSCULAR | Status: AC
  Filled 2015-02-11: qty 1

## 2015-02-11 MED ORDER — LACTATED RINGERS IV SOLN
INTRAVENOUS | Status: DC | PRN
  Administered 2015-02-11: 08:00:00 via INTRAVENOUS

## 2015-02-11 MED ORDER — SODIUM CHLORIDE 0.9 % IJ SOLN
OROMUCOSAL | Status: DC | PRN
  Administered 2015-02-11 (×3): 4 mL via TOPICAL

## 2015-02-11 MED ORDER — LACTATED RINGERS IV SOLN
INTRAVENOUS | Status: DC
  Administered 2015-02-11 – 2015-02-12 (×2): 20 mL/h via INTRAVENOUS

## 2015-02-11 MED ORDER — METOPROLOL TARTRATE 1 MG/ML IV SOLN: 2.5000 mg | INTRAVENOUS | Status: DC | PRN

## 2015-02-11 MED ORDER — LEVOTHYROXINE SODIUM 150 MCG PO TABS
150.0000 ug | ORAL_TABLET | Freq: Every day | ORAL | Status: DC
  Administered 2015-02-12 – 2015-02-20 (×9): 150 ug via ORAL
  Filled 2015-02-11 (×10): qty 1

## 2015-02-11 MED ORDER — DUTASTERIDE 0.5 MG PO CAPS
0.5000 mg | ORAL_CAPSULE | Freq: Every day | ORAL | Status: DC
  Administered 2015-02-12 – 2015-02-20 (×9): 0.5 mg via ORAL
  Filled 2015-02-11 (×9): qty 1

## 2015-02-11 MED ORDER — ARTIFICIAL TEARS OP OINT
TOPICAL_OINTMENT | OPHTHALMIC | Status: DC | PRN
Start: 2015-02-11 — End: 2015-02-11
  Administered 2015-02-11: 1 via OPHTHALMIC

## 2015-02-11 MED ORDER — LACTATED RINGERS IV SOLN
INTRAVENOUS | Status: DC | PRN
  Administered 2015-02-11: 07:00:00 via INTRAVENOUS

## 2015-02-11 MED ORDER — MIDAZOLAM HCL 10 MG/2ML IJ SOLN
INTRAMUSCULAR | Status: AC
  Filled 2015-02-11: qty 2

## 2015-02-11 MED ORDER — ESCITALOPRAM OXALATE 20 MG PO TABS
20.0000 mg | ORAL_TABLET | Freq: Every day | ORAL | Status: DC
  Administered 2015-02-12 – 2015-02-20 (×9): 20 mg via ORAL
  Filled 2015-02-11 (×9): qty 1

## 2015-02-11 MED ORDER — VECURONIUM BROMIDE 10 MG IV SOLR
INTRAVENOUS | Status: AC
  Filled 2015-02-11: qty 10

## 2015-02-11 MED ORDER — ALBUTEROL SULFATE (2.5 MG/3ML) 0.083% IN NEBU: 2.5000 mg | INHALATION_SOLUTION | Freq: Four times a day (QID) | RESPIRATORY_TRACT | Status: DC | PRN

## 2015-02-11 MED ORDER — ALBUTEROL SULFATE HFA 108 (90 BASE) MCG/ACT IN AERS: 2.0000 | INHALATION_SPRAY | Freq: Four times a day (QID) | RESPIRATORY_TRACT | Status: DC | PRN

## 2015-02-11 MED ORDER — MORPHINE SULFATE 2 MG/ML IJ SOLN: 1.0000 mg | INTRAMUSCULAR | Status: AC | PRN

## 2015-02-11 MED ORDER — BISACODYL 10 MG RE SUPP: 10.0000 mg | Freq: Every day | RECTAL | Status: DC

## 2015-02-11 MED ORDER — 0.9 % SODIUM CHLORIDE (POUR BTL) OPTIME
TOPICAL | Status: DC | PRN
  Administered 2015-02-11: 6000 mL

## 2015-02-11 MED ORDER — PROPOFOL 10 MG/ML IV BOLUS
INTRAVENOUS | Status: DC | PRN
  Administered 2015-02-11: 60 mg via INTRAVENOUS

## 2015-02-11 MED ORDER — MORPHINE SULFATE 2 MG/ML IJ SOLN
2.0000 mg | INTRAMUSCULAR | Status: DC | PRN
  Administered 2015-02-11: 2 mg via INTRAVENOUS
  Filled 2015-02-11: qty 1

## 2015-02-11 MED ORDER — DOCUSATE SODIUM 100 MG PO CAPS
200.0000 mg | ORAL_CAPSULE | Freq: Every day | ORAL | Status: DC
  Administered 2015-02-12 – 2015-02-20 (×8): 200 mg via ORAL
  Filled 2015-02-11 (×8): qty 2

## 2015-02-11 MED ORDER — ACETAMINOPHEN 160 MG/5ML PO SOLN: 650.0000 mg | Freq: Once | ORAL | Status: AC

## 2015-02-11 MED ORDER — SODIUM CHLORIDE 0.9 % IV SOLN
250.0000 mL | INTRAVENOUS | Status: DC
  Administered 2015-02-12: 250 mL via INTRAVENOUS

## 2015-02-11 MED ORDER — SODIUM CHLORIDE 0.9 % IJ SOLN
3.0000 mL | Freq: Two times a day (BID) | INTRAMUSCULAR | Status: DC
  Administered 2015-02-12: 10 mL via INTRAVENOUS
  Administered 2015-02-12 – 2015-02-14 (×4): 3 mL via INTRAVENOUS
  Administered 2015-02-14: 10 mL via INTRAVENOUS
  Administered 2015-02-15 – 2015-02-20 (×11): 3 mL via INTRAVENOUS

## 2015-02-11 MED ORDER — POTASSIUM CHLORIDE 10 MEQ/50ML IV SOLN: 10.0000 meq | INTRAVENOUS | Status: AC

## 2015-02-11 MED ORDER — DEXMEDETOMIDINE HCL IN NACL 200 MCG/50ML IV SOLN
0.0000 ug/kg/h | INTRAVENOUS | Status: DC
  Administered 2015-02-11: 0.7 ug/kg/h via INTRAVENOUS
  Filled 2015-02-11: qty 50

## 2015-02-11 MED ORDER — SODIUM CHLORIDE 0.9 % IV SOLN
INTRAVENOUS | Status: DC | PRN
  Administered 2015-02-11: 13:00:00 via INTRAVENOUS

## 2015-02-11 MED ORDER — DEXTROSE 5 % IV SOLN
1.5000 g | Freq: Two times a day (BID) | INTRAVENOUS | Status: AC
  Administered 2015-02-11 – 2015-02-13 (×4): 1.5 g via INTRAVENOUS
  Filled 2015-02-11 (×4): qty 1.5

## 2015-02-11 MED ORDER — TRAMADOL HCL 50 MG PO TABS: 50.0000 mg | ORAL_TABLET | ORAL | Status: DC | PRN

## 2015-02-11 MED ORDER — METOPROLOL TARTRATE 12.5 MG HALF TABLET
12.5000 mg | ORAL_TABLET | Freq: Two times a day (BID) | ORAL | Status: DC
  Administered 2015-02-13: 12.5 mg via ORAL
  Filled 2015-02-11 (×5): qty 1

## 2015-02-11 MED ORDER — MILRINONE IN DEXTROSE 20 MG/100ML IV SOLN
0.3750 ug/kg/min | INTRAVENOUS | Status: DC
  Administered 2015-02-11: 0.25 ug/kg/min via INTRAVENOUS
  Filled 2015-02-11: qty 100

## 2015-02-11 MED ORDER — SODIUM CHLORIDE 0.45 % IV SOLN
INTRAVENOUS | Status: DC | PRN
  Administered 2015-02-11: 14:00:00 via INTRAVENOUS

## 2015-02-11 MED ORDER — PROTAMINE SULFATE 10 MG/ML IV SOLN
INTRAVENOUS | Status: AC
  Filled 2015-02-11: qty 25

## 2015-02-11 MED ORDER — PHENYLEPHRINE HCL 10 MG/ML IJ SOLN
20.0000 mg | INTRAVENOUS | Status: DC | PRN
  Administered 2015-02-11: 20 ug/min via INTRAVENOUS
  Administered 2015-02-11: 80 ug/min via INTRAVENOUS

## 2015-02-11 MED ORDER — ALBUMIN HUMAN 5 % IV SOLN
INTRAVENOUS | Status: DC | PRN
  Administered 2015-02-11 (×2): via INTRAVENOUS

## 2015-02-11 MED ORDER — MAGNESIUM SULFATE 4 GM/100ML IV SOLN
4.0000 g | Freq: Once | INTRAVENOUS | Status: AC
  Administered 2015-02-11: 4 g via INTRAVENOUS
  Filled 2015-02-11: qty 100

## 2015-02-11 MED ORDER — BUSPIRONE HCL 15 MG PO TABS
15.0000 mg | ORAL_TABLET | Freq: Two times a day (BID) | ORAL | Status: DC
  Administered 2015-02-12 – 2015-02-20 (×17): 15 mg via ORAL
  Filled 2015-02-11 (×18): qty 1

## 2015-02-11 MED ORDER — OXYCODONE HCL 5 MG PO TABS
5.0000 mg | ORAL_TABLET | ORAL | Status: DC | PRN
  Administered 2015-02-12: 10 mg via ORAL
  Administered 2015-02-12 (×2): 5 mg via ORAL
  Administered 2015-02-13 (×2): 10 mg via ORAL
  Administered 2015-02-13 – 2015-02-14 (×2): 5 mg via ORAL
  Filled 2015-02-11 (×2): qty 1
  Filled 2015-02-11: qty 2
  Filled 2015-02-11 (×3): qty 1
  Filled 2015-02-11: qty 2

## 2015-02-11 MED ORDER — METOCLOPRAMIDE HCL 5 MG/ML IJ SOLN
10.0000 mg | Freq: Four times a day (QID) | INTRAMUSCULAR | Status: AC
  Administered 2015-02-11 – 2015-02-12 (×3): 10 mg via INTRAVENOUS
  Filled 2015-02-11 (×3): qty 2

## 2015-02-11 MED ORDER — HEPARIN SODIUM (PORCINE) 1000 UNIT/ML IJ SOLN
INTRAMUSCULAR | Status: DC | PRN
  Administered 2015-02-11: 35000 [IU] via INTRAVENOUS

## 2015-02-11 MED ORDER — BISACODYL 5 MG PO TBEC
10.0000 mg | DELAYED_RELEASE_TABLET | Freq: Every day | ORAL | Status: DC
  Administered 2015-02-12 – 2015-02-19 (×5): 10 mg via ORAL
  Filled 2015-02-11 (×5): qty 2

## 2015-02-11 MED ORDER — FENTANYL CITRATE (PF) 250 MCG/5ML IJ SOLN
INTRAMUSCULAR | Status: DC | PRN
  Administered 2015-02-11: 175 ug via INTRAVENOUS
  Administered 2015-02-11: 250 ug via INTRAVENOUS
  Administered 2015-02-11: 500 ug via INTRAVENOUS
  Administered 2015-02-11: 25 ug via INTRAVENOUS
  Administered 2015-02-11: 50 ug via INTRAVENOUS
  Administered 2015-02-11: 250 ug via INTRAVENOUS

## 2015-02-11 MED FILL — Sodium Chloride IV Soln 0.9%: INTRAVENOUS | Qty: 2000 | Status: AC

## 2015-02-11 MED FILL — Mannitol IV Soln 20%: INTRAVENOUS | Qty: 500 | Status: AC

## 2015-02-11 MED FILL — Electrolyte-R (PH 7.4) Solution: INTRAVENOUS | Qty: 3000 | Status: AC

## 2015-02-11 MED FILL — Lidocaine HCl IV Inj 20 MG/ML: INTRAVENOUS | Qty: 5 | Status: AC

## 2015-02-11 MED FILL — Sodium Bicarbonate IV Soln 8.4%: INTRAVENOUS | Qty: 50 | Status: AC

## 2015-02-11 MED FILL — Heparin Sodium (Porcine) Inj 1000 Unit/ML: INTRAMUSCULAR | Qty: 10 | Status: AC

## 2015-02-11 SURGICAL SUPPLY — 99 items
ADAPTER CARDIO PERF ANTE/RETRO (ADAPTER) ×3 IMPLANT
ADH SKN CLS APL DERMABOND .7 (GAUZE/BANDAGES/DRESSINGS) ×4
ADPR PRFSN 84XANTGRD RTRGD (ADAPTER) ×2
AGENT HMST MTR 8 SURGIFLO (HEMOSTASIS) ×2
APPLICATOR COTTON TIP 6IN STRL (MISCELLANEOUS) IMPLANT
ATRICLIP EXCLUSION 35 STD HAND (Clip) ×1 IMPLANT
BAG DECANTER FOR FLEXI CONT (MISCELLANEOUS) ×3 IMPLANT
BANDAGE ELASTIC 4 VELCRO ST LF (GAUZE/BANDAGES/DRESSINGS) ×4 IMPLANT
BANDAGE ELASTIC 6 VELCRO ST LF (GAUZE/BANDAGES/DRESSINGS) ×4 IMPLANT
BLADE STERNUM SYSTEM 6 (BLADE) ×3 IMPLANT
BLADE SURG 11 STRL SS (BLADE) ×1 IMPLANT
BLADE SURG 15 STRL LF DISP TIS (BLADE) ×2 IMPLANT
BLADE SURG 15 STRL SS (BLADE) ×3
BNDG GAUZE ELAST 4 BULKY (GAUZE/BANDAGES/DRESSINGS) ×4 IMPLANT
BOOT SUTURE AID YELLOW STND (SUTURE) IMPLANT
CANISTER SUCTION 2500CC (MISCELLANEOUS) ×3 IMPLANT
CANNULA GUNDRY RCSP 15FR (MISCELLANEOUS) ×3 IMPLANT
CATH CPB KIT GERHARDT (MISCELLANEOUS) ×3 IMPLANT
CATH HEART VENT LEFT (CATHETERS) ×2 IMPLANT
CATH ROBINSON RED A/P 18FR (CATHETERS) ×1 IMPLANT
CATH THORACIC 28FR (CATHETERS) ×3 IMPLANT
CATH/SQUID NICHOLS JEHLE COR (CATHETERS) ×1 IMPLANT
CONN ST 1/4X3/8  BEN (MISCELLANEOUS) ×1
CONN ST 1/4X3/8 BEN (MISCELLANEOUS) IMPLANT
CONT SPEC 4OZ CLIKSEAL STRL BL (MISCELLANEOUS) ×1 IMPLANT
COVER MAYO STAND STRL (DRAPES) ×1 IMPLANT
CRADLE DONUT ADULT HEAD (MISCELLANEOUS) ×3 IMPLANT
DERMABOND ADVANCED (GAUZE/BANDAGES/DRESSINGS) ×2
DERMABOND ADVANCED .7 DNX12 (GAUZE/BANDAGES/DRESSINGS) IMPLANT
DRAIN CHANNEL 28F RND 3/8 FF (WOUND CARE) ×3 IMPLANT
DRAPE CARDIOVASCULAR INCISE (DRAPES) ×3
DRAPE SLUSH/WARMER DISC (DRAPES) ×3 IMPLANT
DRAPE SRG 135X102X78XABS (DRAPES) ×2 IMPLANT
DRSG AQUACEL AG ADV 3.5X14 (GAUZE/BANDAGES/DRESSINGS) ×3 IMPLANT
ELECT BLADE 4.0 EZ CLEAN MEGAD (MISCELLANEOUS) ×3
ELECT CAUTERY BLADE 6.4 (BLADE) ×3 IMPLANT
ELECT REM PT RETURN 9FT ADLT (ELECTROSURGICAL) ×6
ELECTRODE BLDE 4.0 EZ CLN MEGD (MISCELLANEOUS) ×2 IMPLANT
ELECTRODE REM PT RTRN 9FT ADLT (ELECTROSURGICAL) ×4 IMPLANT
GAUZE SPONGE 4X4 12PLY STRL (GAUZE/BANDAGES/DRESSINGS) ×6 IMPLANT
GLOVE BIO SURGEON STRL SZ 6.5 (GLOVE) ×15 IMPLANT
GOWN STRL REUS W/ TWL LRG LVL3 (GOWN DISPOSABLE) ×8 IMPLANT
GOWN STRL REUS W/TWL LRG LVL3 (GOWN DISPOSABLE) ×12
HEMOSTAT POWDER SURGIFOAM 1G (HEMOSTASIS) ×9 IMPLANT
HEMOSTAT SURGICEL 2X14 (HEMOSTASIS) ×3 IMPLANT
INSERT FOGARTY XLG (MISCELLANEOUS) IMPLANT
KIT BASIN OR (CUSTOM PROCEDURE TRAY) ×3 IMPLANT
KIT CATH SUCT 8FR (CATHETERS) ×3 IMPLANT
KIT ROOM TURNOVER OR (KITS) ×3 IMPLANT
KIT SUCTION CATH 14FR (SUCTIONS) ×6 IMPLANT
KIT VASOVIEW W/TROCAR VH 2000 (KITS) ×3 IMPLANT
LEAD PACING MYOCARDI (MISCELLANEOUS) ×3 IMPLANT
LINE VENT (MISCELLANEOUS) ×2 IMPLANT
MARKER GRAFT CORONARY BYPASS (MISCELLANEOUS) ×9 IMPLANT
NS IRRIG 1000ML POUR BTL (IV SOLUTION) ×16 IMPLANT
PACK OPEN HEART (CUSTOM PROCEDURE TRAY) ×3 IMPLANT
PACK TRANSFER 300ML (TOM) (MISCELLANEOUS) ×3 IMPLANT
PAD ARMBOARD 7.5X6 YLW CONV (MISCELLANEOUS) ×6 IMPLANT
PAD ELECT DEFIB RADIOL ZOLL (MISCELLANEOUS) ×3 IMPLANT
PENCIL BUTTON HOLSTER BLD 10FT (ELECTRODE) ×3 IMPLANT
SET CARDIOPLEGIA MPS 5001102 (MISCELLANEOUS) ×1 IMPLANT
SET Y EXTENSION LINE CSP (IV SETS) ×1 IMPLANT
SOLUTION ANTI FOG 6CC (MISCELLANEOUS) ×1 IMPLANT
SPOGE SURGIFLO 8M (HEMOSTASIS) ×1
SPONGE GAUZE 4X4 12PLY STER LF (GAUZE/BANDAGES/DRESSINGS) ×2 IMPLANT
SPONGE LAP 18X18 X RAY DECT (DISPOSABLE) ×1 IMPLANT
SPONGE SURGIFLO 8M (HEMOSTASIS) IMPLANT
SUT BONE WAX W31G (SUTURE) ×3 IMPLANT
SUT ETHIBON 2 0 V 52N 30 (SUTURE) ×6 IMPLANT
SUT ETHIBOND 2 0 SH (SUTURE) ×3
SUT ETHIBOND 2 0 SH 36X2 (SUTURE) IMPLANT
SUT ETHILON 3 0 FSL (SUTURE) ×1 IMPLANT
SUT MNCRL AB 4-0 PS2 18 (SUTURE) ×2 IMPLANT
SUT PROLENE 3 0 RB 1 (SUTURE) ×3 IMPLANT
SUT PROLENE 3 0 SH1 36 (SUTURE) ×3 IMPLANT
SUT PROLENE 4 0 RB 1 (SUTURE) ×18
SUT PROLENE 4 0 TF (SUTURE) ×6 IMPLANT
SUT PROLENE 4-0 RB1 .5 CRCL 36 (SUTURE) IMPLANT
SUT PROLENE 6 0 CC (SUTURE) ×6 IMPLANT
SUT PROLENE 7 0 BV1 MDA (SUTURE) ×3 IMPLANT
SUT SILK 2 0 SH CR/8 (SUTURE) ×1 IMPLANT
SUT STEEL 6MS V (SUTURE) ×3 IMPLANT
SUT STEEL SZ 6 DBL 3X14 BALL (SUTURE) ×3 IMPLANT
SUT VIC AB 1 CTX 18 (SUTURE) ×6 IMPLANT
SUT VIC AB 2-0 CT1 27 (SUTURE) ×6
SUT VIC AB 2-0 CT1 TAPERPNT 27 (SUTURE) IMPLANT
SUTURE E-PAK OPEN HEART (SUTURE) ×3 IMPLANT
SYSTEM SAHARA CHEST DRAIN ATS (WOUND CARE) ×3 IMPLANT
TAPE CLOTH SURG 4X10 WHT LF (GAUZE/BANDAGES/DRESSINGS) ×1 IMPLANT
TAPE PAPER 2X10 WHT MICROPORE (GAUZE/BANDAGES/DRESSINGS) ×1 IMPLANT
TOWEL OR 17X24 6PK STRL BLUE (TOWEL DISPOSABLE) ×6 IMPLANT
TOWEL OR 17X26 10 PK STRL BLUE (TOWEL DISPOSABLE) ×6 IMPLANT
TRAY FOLEY IC TEMP SENS 16FR (CATHETERS) ×3 IMPLANT
TUBING INSUFFLATION (TUBING) ×3 IMPLANT
TUBING PVC 1/4X1/16 WALL 8 (MISCELLANEOUS) ×2 IMPLANT
UNDERPAD 30X30 INCONTINENT (UNDERPADS AND DIAPERS) ×3 IMPLANT
VALVE MAGNA EASE AORTIC 27MM (Prosthesis & Implant Heart) ×1 IMPLANT
VENT LEFT HEART 12002 (CATHETERS) ×3
WATER STERILE IRR 1000ML POUR (IV SOLUTION) ×6 IMPLANT

## 2015-02-11 NOTE — Transfer of Care (Signed)
Immediate Anesthesia Transfer of Care Note  Patient: [name] [name]  Procedure(s) Performed: Procedure(s): AORTIC VALVE REPLACEMENT (AVR) (N/A) CLIPPING OF ATRIAL APPENDAGE (N/A) TRANSESOPHAGEAL ECHOCARDIOGRAM (TEE) (N/A)  Patient Location: ICU  Anesthesia Type:General  Level of Consciousness: unresponsive and Patient remains intubated per anesthesia plan  Airway & Oxygen Therapy: Patient remains intubated per anesthesia plan and Patient placed on Ventilator (see vital sign flow sheet for setting)  Post-op Assessment: Report given to RN and Post -op Vital signs reviewed and stable  Post vital signs: Reviewed and stable  Last Vitals:  Filed Vitals:   02/11/15 0621  BP: 165/48  Pulse: 76  Temp: 36.5 C  Resp: 16    Complications: No apparent anesthesia complications

## 2015-02-11 NOTE — Anesthesia Preprocedure Evaluation (Addendum)
Anesthesia Evaluation  Patient identified by MRN, date of birth, ID band Patient awake    Reviewed: Allergy & Precautions, NPO status , Patient's Chart, lab work & pertinent test results, reviewed documented beta blocker date and time   History of Anesthesia Complications (+) DIFFICULT AIRWAY  Airway Mallampati: III  TM Distance: <3 FB Neck ROM: Full    Dental  (+) Teeth Intact, Dental Advisory Given   Pulmonary sleep apnea , COPD COPD inhaler, former smoker,  breath sounds clear to auscultation        Cardiovascular hypertension, Pt. on medications and Pt. on home beta blockers + CAD + dysrhythmias Atrial Fibrillation + Valvular Problems/Murmurs AI Rhythm:Regular Rate:Normal     Neuro/Psych    GI/Hepatic GERD-  Medicated and Controlled,  Endo/Other  Hypothyroidism   Renal/GU      Musculoskeletal  (+) Arthritis -,   Abdominal (+) + obese,   Peds  Hematology   Anesthesia Other Findings   Reproductive/Obstetrics                            Anesthesia Physical Anesthesia Plan  ASA: IV  Anesthesia Plan: General   Post-op Pain Management:    Induction: Intravenous  Airway Management Planned: Oral ETT and Video Laryngoscope Planned  Additional Equipment: Arterial line, CVP, PA Cath and TEE  Intra-op Plan:   Post-operative Plan: Post-operative intubation/ventilation  Informed Consent: I have reviewed the patients History and Physical, chart, labs and discussed the procedure including the risks, benefits and alternatives for the proposed anesthesia with the patient or authorized representative who has indicated his/her understanding and acceptance.   Dental advisory given  Plan Discussed with: CRNA, Anesthesiologist and Surgeon  Anesthesia Plan Comments:         Anesthesia Quick Evaluation

## 2015-02-11 NOTE — OR Nursing (Signed)
12:35- 1st call to SICU, 13:05 - 2nd call to SICU

## 2015-02-11 NOTE — Progress Notes (Signed)
Patient placed back on SIMV rate of 10 and 40%, due to VC of only 0.5 L achieved and low PaO2 on blood gas. Will reattempt in 30 minutes. RN aware

## 2015-02-11 NOTE — Brief Op Note (Addendum)
      [address]       [name] [address]             [phone]      02/11/2015  2:42 PM  PATIENT:  [name] [name]  81 y.o. male  PRE-OPERATIVE DIAGNOSIS:  SEVERE AI CAD AFIB  POST-OPERATIVE DIAGNOSIS:  SEVERE AI CAD AFIB  PROCEDURE:  Procedure(s):  AORTIC VALVE REPLACEMENT  -27 mm Edwards Magna Ease Pericardial Tissue Valve  CLIPPING OF ATRIAL APPENDAGE  -35 mm Atricure Clip  ENDOSCOPIC HARVEST GREATER SAPHENOUS VEIN  -Right leg- unsuitable for conduit -Left leg- explored, no suitable conduit identified TRANSESOPHAGEAL ECHOCARDIOGRAM (TEE) (N/A)  SURGEON:  Surgeon(s) and Role:    * [name], MD - Primary  PHYSICIAN ASSISTANT: Erin Barrett PA-C  ANESTHESIA:   general  EBL:  Total I/O In: 4051 [I.V.:3151; Blood:500; IV Piggyback:400] Out: 2825 [Urine:1725; Blood:1100]  BLOOD ADMINISTERED: CELLSAVER  DRAINS: Mediastinal Chest drains   LOCAL MEDICATIONS USED:  NONE  SPECIMEN:  Source of Specimen:  Aortic Valve   DISPOSITION OF SPECIMEN:  PATHOLOGY  COUNTS:  YES  TOURNIQUET:  * No tourniquets in log *  DICTATION: .Dragon Dictation  PLAN OF CARE: Admit to inpatient   PATIENT DISPOSITION:  ICU - intubated and hemodynamically stable.   Delay start of Pharmacological VTE agent (>24hrs) due to surgical blood loss or risk of bleeding: yes   Aortic Valve Etiology   Aortic Insufficiency:  Severe  Aortic Valve Disease:  Yes.  Aortic Stenosis:  No.  Etiology (Choose at least one and up to  5 etiologies):  Degenerative - Pure annular dilation without leaflet prolapse    Aortic Valve  Procedure Performed:  Replacement: Yes.  Bioprosthetic Valve. Implant Model Number:[id number], Size:27, Unique Device Identifier:[id number]  Repair/Reconstruction: No.   Aortic Annular Enlargement: No.

## 2015-02-11 NOTE — Progress Notes (Signed)
Patient ID: [name] [name], male   DOB: 1932-08-04, 81 y.o.   MRN: [id number] EVENING ROUNDS NOTE :     [address]       [place] [address]             [phone]                 Day of Surgery Procedure(s) (LRB): AORTIC VALVE REPLACEMENT (AVR) (N/A) CLIPPING OF ATRIAL APPENDAGE (N/A) TRANSESOPHAGEAL ECHOCARDIOGRAM (TEE) (N/A)  Total Length of Stay:  LOS: 0 days  BP 91/67 mmHg  Pulse 91  Temp(Src) 98.1 F (36.7 C) (Oral)  Resp 12  Ht 6' (1.829 m)  Wt 255 lb (115.667 kg)  BMI 34.58 kg/m2  SpO2 94%  .Intake/Output      06/13 0701 - 06/14 0700 06/14 0701 - 06/15 0700   I.V. (mL/kg)  3460.5 (29.9)   Blood  500   IV Piggyback  950   Total Intake(mL/kg)  4910.5 (42.5)   Urine (mL/kg/hr)  2400 (2.1)   Blood  1100 (0.9)   Chest Tube  119 (0.1)   Total Output   3619   Net   +1291.5          . sodium chloride 20 mL/hr at 02/11/15 1500  . [START ON 02/12/2015] sodium chloride    . sodium chloride    . dexmedetomidine 0.2 mcg/kg/hr (02/11/15 1704)  . DOPamine 5 mcg/kg/min (02/11/15 1500)  . insulin (NOVOLIN-R) infusion 3.6 Units/hr (02/11/15 1611)  . lactated ringers    . lactated ringers    . milrinone 0.25 mcg/kg/min (02/11/15 1400)  . nitroGLYCERIN Stopped (02/11/15 1500)  . phenylephrine (NEO-SYNEPHRINE) Adult infusion 30 mcg/min (02/11/15 1613)     Lab Results  Component Value Date   WBC 14.7* 02/11/2015   HGB 12.5* 02/11/2015   HCT 37.8* 02/11/2015   PLT 142* 02/11/2015   GLUCOSE 151* 02/11/2015   ALT 17 02/07/2015   AST 29 02/07/2015   NA 141 02/11/2015   K 4.0 02/11/2015   CL 101 02/11/2015   CREATININE 1.50* 02/11/2015   BUN 25* 02/11/2015   CO2 22 02/07/2015   TSH 3.763 10/15/2014   INR 1.50* 02/11/2015   HGBA1C 6.2* 02/07/2015   early post op Not bleeding   [name] MD  Beeper [phone] Office [phone] 02/11/2015 5:06 PM

## 2015-02-11 NOTE — Progress Notes (Signed)
RT attempted recruitment on 30 PC, rate 10, 100%, PEEP 5, and i-time 3.0, due to sats of 91%, however, patients BP began to drop. Patient placed back on SIMV rate of 12, sats up to 95%. Will continue to monitor.

## 2015-02-11 NOTE — Procedures (Signed)
Extubation Procedure Note  Patient Details:   Name: [name] [name] DOB: 1932/03/15 MRN: [id number]   Airway Documentation:     Evaluation  O2 sats: stable throughout Complications: No apparent complications Patient did tolerate procedure well. Bilateral Breath Sounds: Clear   Yes   Pt. Was extubated to a 5L [place] without any complications, dyspnea or stridor noted. Pt. Achieved a goal of 1L on VC & -20 on NIF. Pt. Was instructed on IS x 5, highest goal achieved was 1,055mL.  Carlynn Spry 02/11/2015, 8:19 PM

## 2015-02-11 NOTE — CV Procedure (Signed)
Intra-operative Transesophageal Echocardiography Report:  [name] is a an 81 year old male with a long-standing history of aortic insufficiency and atrial fibrillation. In January 2016 he was hospitalized with increasing dyspnea on exertion and weakness. Over the last 3 months he has shown improvement in his functional status and stamina. He is now scheduled to undergo aortic valve replacement and possible coronary artery bypass grafting by Dr. Tyrone Sage. Intraoperative transesophageal echocardiography was requested to evaluate the aortic valve, to assess for any other valvular pathology, to serve as a monitor for intraoperative volume status, and to serve as a monitor for right and left ventricular function.  The patient was brought to the operating room at [facility] and general anesthesia was induced without difficulty. Following endotracheal intubation and orogastric suctioning, the transesophageal echocardiography probe was inserted into the esophagus without difficulty.  Impression: Pre-bypass findings:  1. Aortic valve: There was severe aortic insufficiency. The aortic valve was trileaflet. However the right coronary cusp appeared to prolapse in systole into the LVOT with a resultant jet of severe aortic insufficiency which was directed posteriorly and tracked along the undersurface of the anteriorly leaflet of the mitral valve. The aortic insufficiency was graded as 4+. There was no thickening or calcification of the leaflets noted. There were no vegetations noted on the leaflets. There was no restriction to opening of the aortic valve.  2. Mitral valve: There was mild thickening of the mitral leaflets. The mitral valve opening appeared somewhat restricted due to the jet of aortic insufficiency  impinging upon the anterior leaflet. The anterior leaflet was fluttering  in diastole. There was trace mitral insufficiency.  3. Left ventricle: The left ventricular size was at the  upper limits of normal and measured 5.2 cm at end diastole at the mid-papillary level in the transgastric short axis view. There were no regional wall motion abnormalities and the ejection fraction was estimated at 55%. There was normal wall thickness. The posterior wall measured 9.2 mm and the anterior wall measured 10.0 mm at end diastole at the mid-papillary level in the transgastric short axis view.  3. Left right ventricle: There was normal right ventricular size and normal appearing contractility of the right ventricular free wall.  4.. Tricuspid valve: The tricuspid valve appeared structurally normal and there was 1+ tricuspid insufficiency.  5. Interatrial septum: The interatrial septum was intact without evidence of patent foramen ovale or atrial septal defect by color Doppler and bubble study.  6. Left atrium: There was no thrombus noted within the left atrium or left atrial appendage.  7. Ascending Aorta: The wall of the ascending aorta was thickened with moderate calcification but no protruding atheromata. The aortic diameter at the sinotubular ridge was 3.04 cm, at the sinuses of Valsalva was 4.2 cm. and  the proximal ascending aorta measured 3.5 cm.  8. Descending aorta: There was scattered atheromatous disease in the descending aorta. The descending aorta measured 3.15 cm in diameter.   Post-bypass findings:  1. Aortic valve: There was a bioprosthetic valve in aortic position. The leaflets opened normally. There was no aortic insufficiency.  2. Mitral valve: The mitral valve opening appeared improved. There was no fluttering of the anterior leaflet and no evident restriction to opening of the anterior leaflet. There was trace  mitral insufficiency.   3. Left ventricle: The left ventricular function somewhat difficult to assess due to a poor acoustic window and and dyssynchrony due to ventricular pacing. However there were no regional wall motion abnormalities. The ejection  fraction  appeared adequate.   4. Right ventricle: The right ventricular size appeared normal. There appeared to be normal contractility of the right ventricular free wall.  5. Tricuspid valve: There was 1+ tricuspid insufficiency.   10. Left atrium: The left atrial appendage could no longer peak of visualized.  Kipp Brood

## 2015-02-11 NOTE — Progress Notes (Signed)
  Echocardiogram Echocardiogram Transesophageal has been performed.  Arvil Chaco 02/11/2015, 9:25 AM

## 2015-02-11 NOTE — Progress Notes (Signed)
Utilization Review Completed.  

## 2015-02-11 NOTE — H&P (Signed)
[address]       [place] [address]             [phone]                    MONTE ZINNI [facility] Medical Record #[id number] Date of Birth: 20-Sep-1931  Referring: self Primary Care: Cain Saupe, MD Cardiology: Dr Becky Augusta   Chief Complaint:    Valve proplem.   History of Present Illness:    [name] [name] 81 y.o. male is seen in the office  concerning aortic valve replacement and coronary artery bypass grafting.He has been followed at [facility] for many years with  aortic valve insufficiency atrial fibrillation and hypertension. The patient was  hospitalized at [facility] in January with increasing dyspnea on exertion and difficulty carrying objects and caring for his usual activities. He  underwent further evaluation for consideration of surgery including cardiac cath CT scan of the chest and echocardiogram. At that time it was recommended to him that he needed an aortic valve replacement and coronary artery bypass but was not felt to be strong enough and was discharge to a nursing home. He saw me 4 weeks ago for  opinion concerning   cardiac surgery . At that time he was still very weak and not independent .  Since he has improved and is now living independently.    Overall marked improvement from the first visit [place] on. He has seen neurology , he was not able to have MRI because of metal in his brain.     Current Activity/ Functional Status:  Patient is  independent with mobility/ambulation, transfers, ADL's, IADL's. . His son notes that he has been walking has markedly improved.  He denies chest pain   Note from [facility]. [name] presented for pre-hydration prior to RHC/LHC. TTE on 09/18/2014 showed an LVEF 50%, LVIDs 4.4 cm, severe AR. RHC/LHC on 09/20/2014 showed a RA 11 RV 50/13 PA 47/25 (33) PCWP 18 CI 1.6 PVR 4.0 and a 70% distal RCA lesion. After discussion among the heart team, given his frailty, the decision was made to send him to a SNF given his frailty  and have him re-evaluated in clinic by Dr. [name] for CABG+AVR. He would likely be a poor TAVR candidate given the absence of Ca in his AoV annulus (CT Chest obtained while in house).   [name] Score: At the time of surgery this patient's most appropriate activity status/level should be described as: []     0    Normal activity, no symptoms [x]     1    Restricted in physical strenuous activity but ambulatory, able to do out light work []     2    Ambulatory and capable of self care, unable to do work activities, up and about               >50 % of waking hours                              []     3    Only limited self care, in bed greater than 50% of waking hours []     4    Completely disabled, no self care, confined to bed or chair []     5    Moribund   Past Medical History  Diagnosis Date  . Atrial fibrillation   . Hypothyroidism   .  Plantar fasciitis   . Seasonal allergies   . Decreased testosterone level   . Varicose veins   . Hypertension   . Hypercholesteremia   . Aortic valve insufficiency   . Depression   . Heart murmur   . DVT (deep venous thrombosis) ~ 2013    "BLE"  . OSA (obstructive sleep apnea)     "suppose to wear mask; I throw it off in my sleep" (10/15/2014)  . COPD (chronic obstructive pulmonary disease)   . Emphysema of lung   . History of blood transfusion 1960    "lots; related to an accident"  . Arthritis     "wee bit; knees, elbows" (10/15/2014)  . Anxiety   . Falls frequently     > 20 times in the last year/notes 10/15/2014  . Syncope and collapse "several times"  . Complication of anesthesia     unsure of complications but pt. states there were complications  . Dysrhythmia     A. Fib  . Coronary artery disease   . Shortness of breath dyspnea   . Chronic kidney disease     "down to ~ 1/2 of their regular use; I see kidney dr. in [place]" (10/15/2014)  . Urine frequency   . Urine incontinence   . Enlarged prostate   . GERD (gastroesophageal reflux  disease)   . Difficult intubation     difficult intubation 12/03/09 and 03/27/10 ([facility]); glidescope used 03/27/10    Past Surgical History  Procedure Laterality Date  . Abdominal aortic aneurysm repair  01/2006; 03/10/2006    Hattie Perch 01/12/2011  . Tonsillectomy    . Hernia repair    . Laparoscopic incisional / umbilical / ventral hernia repair  02/2010    VHR w/mesh/notes 03/28/2010  . Laparoscopic cholecystectomy  08/2009    w/IOC/notes 09/18/2009  . Ercp  11/2009    Hattie Perch 12/05/2009  . Gall stone    . Cardiac catheterization  1990's X 1; 09/2014    no stents  . Nose surgery    . Brain surgery  1960 X 4    "S/P got my skull busted"; pt. states removed internal carotid artery  previous head injury 1960 to hospitalize 93 days  Family History  Problem Relation Age of Onset  . Heart disease Father   . Rheum arthritis Mother   . Rheum arthritis Father   . Cancer Sister   . Stroke Sister     History   Social History  . Marital Status: Married    Spouse Name: [name]  . Number of Children: 1  . Years of Education: Master's   Occupational History  . Not on file.   Social History Main Topics  . Smoking status: Former Smoker -- 2.00 packs/day for 22 years    Types: Cigarettes    Quit date: 04/29/1974  . Smokeless tobacco: Never Used  . Alcohol Use: 0.0 oz/week    0 Standard drinks or equivalent per week     Comment: 1 beer per year   . Drug Use: No  . Sexual Activity: Not Currently   Other Topics Concern  . Not on file   Social History Narrative   Lives at home with wife.   Right handed.   Caffeine use: 3 cups coffee per day.   Drinks 4 cups tea per day   Drinks soda very rarely     History  Smoking status  . Former Smoker -- 2.00 packs/day for 22 years  . Types: Cigarettes  .  Quit date: 04/29/1974  Smokeless tobacco  . Never Used    History  Alcohol Use  . 0.0 oz/week  . 0 Standard drinks or equivalent per week    Comment: 1 beer per year      No Known  Allergies  Current Facility-Administered Medications  Medication Dose Route Frequency Provider Last Rate Last Dose  . aminocaproic acid (AMICAR) 10 g in sodium chloride 0.9 % 100 mL infusion   Intravenous To OR Scarlett Presto, RPH      . cefUROXime (ZINACEF) 1.5 g in dextrose 5 % 50 mL IVPB  1.5 g Intravenous To OR Scarlett Presto, RPH      . cefUROXime (ZINACEF) 750 mg in dextrose 5 % 50 mL IVPB  750 mg Intravenous To OR Scarlett Presto, RPH      . dexmedetomidine (PRECEDEX) 400 MCG/100ML (4 mcg/mL) infusion  0.1-0.7 mcg/kg/hr Intravenous To OR Scarlett Presto, RPH      . DOPamine (INTROPIN) 800 mg in dextrose 5 % 250 mL (3.2 mg/mL) infusion  0-10 mcg/kg/min Intravenous To OR Scarlett Presto, RPH      . EPINEPHrine (ADRENALIN) 4 mg in dextrose 5 % 250 mL (0.016 mg/mL) infusion  0-10 mcg/min Intravenous To OR Scarlett Presto, RPH      . heparin 2,500 Units, papaverine 30 mg in electrolyte-148 (PLASMALYTE-148) 500 mL irrigation   Irrigation To OR Scarlett Presto, RPH      . heparin 30,000 units/NS 1000 mL solution for CELLSAVER   Other To OR Scarlett Presto, [facility]      . insulin regular (NOVOLIN R,HUMULIN R) 250 Units in sodium chloride 0.9 % 250 mL (1 Units/mL) infusion   Intravenous To OR Scarlett Presto, RPH      . magnesium sulfate (IV Push/IM) injection 40 mEq  40 mEq Other To OR Scarlett Presto, RPH      . nitroGLYCERIN 50 mg in dextrose 5 % 250 mL (0.2 mg/mL) infusion  2-200 mcg/min Intravenous To OR Scarlett Presto, RPH      . phenylephrine (NEO-SYNEPHRINE) 20 mg in dextrose 5 % 250 mL (0.08 mg/mL) infusion  30-200 mcg/min Intravenous To OR Scarlett Presto, RPH      . potassium chloride injection 80 mEq  80 mEq Other To OR Scarlett Presto, [facility]      . vancomycin (VANCOCIN) 1,500 mg in sodium chloride 0.9 % 250 mL IVPB  1,500 mg Intravenous To OR Scarlett Presto, RPH       Facility-Administered Medications Ordered in Other Encounters  Medication Dose Route Frequency Provider Last Rate Last Dose  .  fentaNYL (SUBLIMAZE) injection    Anesthesia Intra-op Shireen Quan, CRNA   50 mcg at 02/11/15 0650  . lactated ringers infusion    Continuous PRN Shireen Quan, CRNA      . midazolam (VERSED) 5 MG/5ML injection    Anesthesia Intra-op Shireen Quan, CRNA   2 mg at 02/11/15 1610      Review of Systems:     Cardiac Review of Systems: Y or N  Chest Pain [ n   ]  Resting SOB [ y  ] Exertional SOB  [ y ]  Pollyann [place].Etienne  ]   Pedal Edema [ y  ]    Palpitations [ y ] Syncope  [ n ]   Presyncope [ y  ]  General Review of Systems: [Y] = yes [  ]=no Constitional:  recent weight change [ y ];  Wt loss over the last 3 months [   ] anorexia [  ]; fatigue [  ]; nausea [  ]; night sweats [  ]; fever [  ]; or chills [  ];          Dental: poor dentition[  ]; Last Dentist visit:   Eye : blurred vision [  ]; diplopia [   ]; vision changes [  ];  Amaurosis fugax[  ]; Resp: cough [ y ];  wheezingy[  ];  hemoptysis[n  ]; shortness of breath[ y ]; paroxysmal nocturnal dyspnea[ y ]; dyspnea on exertion[  y]; or orthopnea[ y ];  GI:  gallstones[  ], vomiting[  ];  dysphagia[  ]; melena[  ];  hematochezia [  ]; heartburn[  ];   Hx of  Colonoscopy[y  ]; GU: kidney stones [  ]; hematuria[  ];   dysuria [  ];  nocturia[  ];  history of     obstruction [  ]; urinary frequency [  ]             Skin: rash, swelling[  ];, hair loss[  ];  peripheral edema[  ];  or itching[  ]; Musculosketetal: myalgias[  ];  joint swelling[  ];  joint erythema[  ];  joint pain[  ];  back pain[  ];  Heme/Lymph: bruising[ y ];  bleeding[ n ];  anemia[ n ];  Neuro: TIA[  ];  headaches[  ];  stroke[n  ];  vertigo[ y ];  seizures[n  ];   paresthesias[  ];  difficulty walking[improved  ];  Psych:depression[y  ]; Kendell Bane  ];  Endocrine: diabetes[n  ];  thyroid dysfunction[ y ];  Immunizations: Flu up to date [ ? ]; Pneumococcal up to date [?  ];  Other:  Physical Exam: BP 165/48 mmHg  Pulse 76  Temp(Src) 97.7 F (36.5 C) (Oral)  Resp 16   Ht 6' (1.829 m)  Wt 255 lb (115.667 kg)  BMI 34.58 kg/m2  SpO2 94%  PHYSICAL EXAMINATION: General appearance: alert, cooperative and patient is very alert today  Head: Normocephalic, without obvious abnormality, atraumatic Neck: no adenopathy, no carotid bruit, no JVD, supple, symmetrical, trachea midline, thyroid not enlarged, symmetric, no tenderness/mass/nodules and scar from left carotid surgery Lymph nodes: Cervical, supraclavicular, and axillary nodes normal. Resp: clear to auscultation bilaterally Back: symmetric, no curvature. ROM normal. No CVA tenderness. Cardio: irregularly irregular rhythm GI: soft, non-tender; bowel sounds normal; no masses,  no organomegaly Extremities: edema mild bilaterial edema Neurologic: Mental status: Alert, oriented, thought content appropriate, alertness: alert, mildly confused about some events and clear about others Coordination:  walks  Gait:Patient's independently ambulatory in and out of the office improved from his previous visit   Diagnostic Studies & Laboratory data:     Recent Radiology Findings:   Dg Chest 2 View  02/07/2015   CLINICAL DATA:  Preop aortic valve replacement  EXAM: CHEST  2 VIEW  COMPARISON:  06/20/2014  FINDINGS: Lungs are clear.  No pleural effusion or pneumothorax.  Mild elevation of the right hemidiaphragm, chronic.  The heart is enlarged.  Degenerative changes of the visualized thoracolumbar spine.  IMPRESSION: No evidence of acute cardiopulmonary disease.   Electronically Signed   By: Charline Bills M.D.   On: 02/07/2015 13:59     [facility]scan at [facility]: Report only Chest CT without contrast  Indication: I48.2 Chronic atrial fibrillation, I71.4 Abdominal aortic aneurysm, without  rupture, preop  Comparison: March 27, 2012  Protocol:  Volumetric non-contrast chest CT was performed from the lower neck through the adrenal glands.  This study was acquired without intravenous contrast given the patients indications  for the examination. 2D multiplanar reconstruction including sagittal and coronal reconstruction was utilized to enhance assessment of the vasculature including sagittal and coronal reconstructed images. 5 x 5 mm axial maximum intensity projection images (MIPS) were reconstructed to facilitate lung nodule detection.  Findings: Vasculature The ascending thoracic aorta measures up to 4.0 cm. There are moderate mural calcifications. Fat planes between the descending aorta, pulmonary artery and right ventricle and the sternum are preserved. The right ventricle is 2.1 cm posterior to the xiphoid. Brachiocephalic vein is 2 cm posterior to the sternal cortex. The ascending aorta is 4.3 cm posterior to the sternal cortex. Moderate coronary artery calcifications.  Non vascular structures: Debris is seen within the mid trachea. The central airways are patent. Again seen is linear opacities in the right lower lobe with mild bronchiectasis with elevation the right hemidiaphragm, likely related to scarring. Mild centrilobular emphysema. Unchanged 4 mm right middle lobe pulmonary nodule (image 248, series 4). Unchanged 4 mm right lower lobe pulmonary nodule abutting the accessory fissure (image 261, series 4) . Unchanged 4 mm subpleural right lower lobe pulmonary nodule (image 298, series 4). Unchanged 5 mm right lower lobe pulmonary nodule (image 186, series 4). No pleural effusion.  Neck base is normal.  Mild cardiomegaly. No pericardial effusion. No mediastinal, hilar, or axillary lymphadenopathy.  No suspicious lytic or sclerotic osseous lesions. Multilevel degenerative changes of the thoracolumbar spine.  Small hiatal hernia. The patient is status post cholecystectomy. Surgical clips are seen in the gallbladder fossa. Limited images of the upper abdomen are unremarkable.  Impression: 1.Sternotomy measurements as described above. 2.Coronary artery calcifications. 3.Stable pulmonary  nodules.   Electronically Reviewed by:  Larry Sierras, MD Electronically Reviewed on:  09/19/2014 11:20 AM  I have reviewed the images and concur with the above findings.  Electronically Signed by:  Beckie Salts, MD Electronically Signed on:  09/20/2014 12:06 PM  ECHO:  [facility]            [place], [place]                                                      [id number]    DOB: 27-Apr-1932                CARDIAC DIAGNOSTIC UNIT               Date: 09/18/2014 16:00:00                                                      Adult     Male   Age: 67                  ECHO-DOPPLER REPORT                 Inpatient  [id number] ---------------------------------------------------- MD1: Ward, Lajoyce Corners     STUDY: Chest Wall          TAPE: 0000:00:0:00:00 BP: 143/66      ECHO: Yes   DOPPLER: Yes  FILE: [id number]:   HR: 98     COLOR: Yes  CONTRAST: No      MACHINE: IE33 #17  Height: 72 in RV BIOPSY: No         3D: No   SOUND QLTY: Moderate  Weight: 259 lbs    MEDIUM: None                                      BSA: 2.44,  BMI: 35.10 ------------------------------------------------------------------------------    HISTORY:  Arrhythmia and Valvular disease     REASON: Assess Aortic Regurgitation INDICATION: I48.2 - Chronic atrial fibrillation. I35.1 - Nonrheumatic aortic             (valve) insufficiency. Q25.3 - Supravalvular aortic stenosis.             I48.91 - Unspecified atrial fibrillation.   ECHOCARDIOGRAPHIC MEASUREMENTS ----------------------------------------------- 2D DIMENSIONS AORTA          Values   Normal Range       MAIN PA      Values   Normal Range      Annulus:  nm*  cm  [2.3 - 2.9]           PA Main:  nm*  cm  [1.5 - 2.1]    Aorta Sin:   3.8 cm  [3.1 - 3.7]        RIGHT VENTRICLE  ST Junction:  nm*  cm  [2.6 - 3.2]           RV Base:  nm*  cm  [<= 4.2]    Asc.Aorta:   4.0 cm  [2.6 - 3.4]            RV  Mid:  nm*  cm  [<= 3.5] LEFT VENTRICLE                              RV Length:  nm*  cm  [<= 8.6]        LVIDd:   6.0 cm  [4.2 - 5.9]        INFERIOR VENA CAVA        LVIDs:   4.4 cm                        Max.IVC:  nm*  cm  [<= 2.1]           FS:  27%      [> 25%]               Min.IVC:  nm*  cm  [<= 1.7]          SWT:   1.1 cm  [0.6 - 1.0]        __________________          PWT:   1.0 cm  [0.6 - 1.0]        nm* - not measured LEFT ATRIUM      LA Diam:   5.9 cm  [3.0 - 4.0]      LA Area:  39   cm2 [< 20]    LA Volume: 151  mL  [18 - 58]  ECHOCARDIOGRAPHIC DESCRIPTIONS ----------------------------------------------- AORTIC ROOT         Size: DILATED   Dissection: INDETERM FOR DISSECTION  AORTIC VALVE     Leaflets: Tricuspid             Morphology: MILDLY THICKENED     Mobility: Fully Mobile  LEFT VENTRICLE                                      Anterior: Normal         Size: Normal                                 Lateral: Normal  Contraction: REGIONALLY IMPAIRED                     Septal: HYPOCONTRACTILE   Closest EF: 50%  (Estimated)                        Apical: Normal    LV masses: No Masses                             Inferior: Normal          LVH: None                                 Posterior: Normal Dias.FxClass: Normal  MITRAL VALVE     Leaflets: Normal                  Mobility: Fully mobile   Morphology: ANNULAR CALC  LEFT ATRIUM         Size: MODERATELY ENLARGED    LA masses: No masses               Normal IAS  MAIN PA         Size: Normal  PULMONIC VALVE   Morphology: Normal     Mobility: Fully Mobile  RIGHT VENTRICLE         Size: Normal                    Free wall: Normal  Contraction: Normal                    RV masses: No Masses        TAPSE:   2.6 cm,  Normal Range [>= 1.6 cm]  TRICUSPID VALVE     Leaflets: Normal                  Mobility: Fully mobile   Morphology: Normal  RIGHT ATRIUM         Size: Normal                     RA Other:  None    RA masses: No masses  PERICARDIUM        Fluid: No effusion  INFERIOR VENACAVA         Size: Normal     Normal respiratory collapse  DOPPLER ECHO and OTHER SPECIAL PROCEDURES ------------------------------------    Aortic: SEVERE AR              No  AS     Mitral: No MR                  No MS    MV Inflow E Vel.= nm* cm/s  MV Annulus E'Vel.= nm* cm/s  E/E'Ratio= nm*  Tricuspid: TRIVIAL TR             No TS  Pulmonary: No PR                  No PS      Other:  INTERPRETATION ---------------------------------------------------------------   MILD LV DYSFUNCTION (See above)   NORMAL LA PRESSURES WITH NORMAL DIASTOLIC FUNCTION   NORMAL RIGHT VENTRICULAR SYSTOLIC FUNCTION   VALVULAR REGURGITATION: SEVERE AR, TRIVIAL TR   NO VALVULAR STENOSIS   Compared with prior Echo study on 11/13/2013: LEFT VENTRICULAR DIMENSIONS   ARE INCREASED; ASCENDING AORTA MEASURES LARGER BUT IS CONSISTENT WITH   MEASUREMENT FROM 03/17/2012 STUDY.   (Report version 3.0)                    Interpreted and Electronically signed  Perform. by: Percell Locus, RDCS                  by: Earlie Lou, MD  Resp.Person: Percell Locus, RDCS                  On: 09/18/2014 17:18:19  Recent Lab Findings: Lab Results  Component Value Date   WBC 9.1 02/07/2015   HGB 14.6 02/07/2015   HCT 43.5 02/07/2015   PLT 203 02/07/2015   GLUCOSE 93 02/07/2015   ALT 17 02/07/2015   AST 29 02/07/2015   NA 138 02/07/2015   K 4.3 02/07/2015   CL 105 02/07/2015   CREATININE 2.03* 02/07/2015   BUN 24* 02/07/2015   CO2 22 02/07/2015   TSH 3.763 10/15/2014   INR 1.23 02/07/2015   HGBA1C 6.2* 02/07/2015    Chronic Kidney Disease   Stage I     GFR >90  Stage II    GFR 60-89  Stage IIIA GFR 45-59  Stage IIIB GFR 30-44  Stage IV   GFR 15-29  Stage V    GFR  <15  Lab Results  Component Value Date   CREATININE 2.03* 02/07/2015   Estimated Creatinine Clearance: 37.5 mL/min (by C-G formula based on Cr of  2.03).    [facility]  In [facility]- has disease in right coronary 60-70 %   Assessment / Plan:   1/ Acute on chronic diastolic Heart failure, with preserved EF likely from symptomatic AI Patient appears euvolemic on exam today On  lasix  And linopril   2/Afib: rate controled and on eloquis - EKG, tele   3/CKD: stage III A/B patient baseline seems to be around 2.0 currently at baseline   4/depression/anxiety/insomnia  on lexapro, lamictal, lunesta   5/ HTN , HL  Treated  6/ hypothyroidism: TSH is  3.7      on synthroid     7/ stable pulmonary nodules per report from [facility]  I have seen the patient and reviewed his chart including cardiac cath and echo films from [place]. Since first seen 3 months ago ago weeks the patient has has made very significant improvement his overall functional status . He is now independent growth to the office on his own today. He had appointment for neurology evaluation early yesterday,.   I have reviewed the patient's information studies from  [facility] with Dr. Excell Seltzer in regard to being a possible TAVER  candidate. Because of the patient's primary aortic insufficiency and very little calcium in the aortic root Or would not be technically suitable. I have discussed with the patient further open aortic valve replacement and coronary artery bypass grafting in consideration of atrial clipping  procedure.  Risks and options of surgery are discussed. STS risk 22% combined death and morbidity .  The goals risks and alternatives of the planned surgical procedureAVR,tissue and cabg and atrial clip  have been discussed with the patient in detail. The risks of the procedure including death, infection, stroke, myocardial infarction, bleeding, blood transfusion have all been discussed specifically.  I have quoted [employer] a 5 % of perioperative mortality and a complication rate as high as 30 %. The patient's questions have been answered.[name] [name]  is willing  to proceed with the planned procedure.  [name] MD      [address] [place] [place] [address] Office [phone]   Beeper [phone]  02/11/2015 7:22 AM

## 2015-02-11 NOTE — Anesthesia Procedure Notes (Signed)
Procedure Name: Intubation Date/Time: 02/11/2015 8:04 AM Performed by: Charm Barges, Shandon Burlingame R Pre-anesthesia Checklist: Patient identified, Emergency Drugs available, Suction available, Patient being monitored and Timeout performed Patient Re-evaluated:Patient Re-evaluated prior to inductionOxygen Delivery Method: Circle system utilized Preoxygenation: Pre-oxygenation with 100% oxygen Intubation Type: IV induction Ventilation: Mask ventilation with difficulty and Oral airway inserted - appropriate to patient size Laryngoscope Size: Glidescope Grade View: Grade I Tube type: Oral Tube size: 8.0 mm Number of attempts: 1 Airway Equipment and Method: Rigid stylet and Video-laryngoscopy Placement Confirmation: ETT inserted through vocal cords under direct vision,  positive ETCO2 and breath sounds checked- equal and bilateral Secured at: 22 cm Tube secured with: Tape Dental Injury: Teeth and Oropharynx as per pre-operative assessment

## 2015-02-12 ENCOUNTER — Encounter (HOSPITAL_COMMUNITY): Payer: Self-pay | Admitting: Cardiothoracic Surgery

## 2015-02-12 ENCOUNTER — Inpatient Hospital Stay (HOSPITAL_COMMUNITY): Payer: Medicare Other

## 2015-02-12 LAB — GLUCOSE, CAPILLARY
Glucose-Capillary: 108 mg/dL — ABNORMAL HIGH (ref 65–99)
Glucose-Capillary: 108 mg/dL — ABNORMAL HIGH (ref 65–99)
Glucose-Capillary: 109 mg/dL — ABNORMAL HIGH (ref 65–99)
Glucose-Capillary: 110 mg/dL — ABNORMAL HIGH (ref 65–99)
Glucose-Capillary: 113 mg/dL — ABNORMAL HIGH (ref 65–99)
Glucose-Capillary: 113 mg/dL — ABNORMAL HIGH (ref 65–99)
Glucose-Capillary: 117 mg/dL — ABNORMAL HIGH (ref 65–99)
Glucose-Capillary: 117 mg/dL — ABNORMAL HIGH (ref 65–99)
Glucose-Capillary: 117 mg/dL — ABNORMAL HIGH (ref 65–99)
Glucose-Capillary: 120 mg/dL — ABNORMAL HIGH (ref 65–99)
Glucose-Capillary: 122 mg/dL — ABNORMAL HIGH (ref 65–99)
Glucose-Capillary: 125 mg/dL — ABNORMAL HIGH (ref 65–99)
Glucose-Capillary: 125 mg/dL — ABNORMAL HIGH (ref 65–99)
Glucose-Capillary: 125 mg/dL — ABNORMAL HIGH (ref 65–99)
Glucose-Capillary: 135 mg/dL — ABNORMAL HIGH (ref 65–99)
Glucose-Capillary: 90 mg/dL (ref 65–99)

## 2015-02-12 LAB — MAGNESIUM
Magnesium: 2.5 mg/dL — ABNORMAL HIGH (ref 1.7–2.4)
Magnesium: 2.2 mg/dL (ref 1.7–2.4)

## 2015-02-12 LAB — POCT I-STAT, CHEM 8
BUN: 19 mg/dL (ref 6–20)
Calcium, Ion: 1.07 mmol/L — ABNORMAL LOW (ref 1.13–1.30)
Chloride: 101 mmol/L (ref 101–111)
Creatinine, Ser: 1.7 mg/dL — ABNORMAL HIGH (ref 0.61–1.24)
Glucose, Bld: 142 mg/dL — ABNORMAL HIGH (ref 65–99)
HCT: 31 % — ABNORMAL LOW (ref 39.0–52.0)
Hemoglobin: 10.5 g/dL — ABNORMAL LOW (ref 13.0–17.0)
Potassium: 4 mmol/L (ref 3.5–5.1)
Sodium: 138 mmol/L (ref 135–145)
TCO2: 21 mmol/L (ref 0–100)

## 2015-02-12 LAB — CREATININE, SERUM
Creatinine, Ser: 1.73 mg/dL — ABNORMAL HIGH (ref 0.61–1.24)
GFR calc Af Amer: 41 mL/min — ABNORMAL LOW (ref >60)
GFR, EST NON AFRICAN AMERICAN: 35 mL/min — AB (ref >60)

## 2015-02-12 LAB — POCT I-STAT 3, ART BLOOD GAS (G3+)
Acid-base deficit: 2 mmol/L (ref 0.0–2.0)
Bicarbonate: 24.3 mEq/L — ABNORMAL HIGH (ref 20.0–24.0)
O2 Saturation: 93 %
Patient temperature: 36.8
TCO2: 26 mmol/L (ref 0–100)
pCO2 arterial: 45.9 mmHg — ABNORMAL HIGH (ref 35.0–45.0)
pH, Arterial: 7.331 — ABNORMAL LOW (ref 7.350–7.450)
pO2, Arterial: 70 mmHg — ABNORMAL LOW (ref 80.0–100.0)

## 2015-02-12 LAB — CBC
HCT: 31.2 % — ABNORMAL LOW (ref 39.0–52.0)
HCT: 30.4 % — ABNORMAL LOW (ref 39.0–52.0)
HEMOGLOBIN: 10.5 g/dL — AB (ref 13.0–17.0)
HEMOGLOBIN: 10 g/dL — AB (ref 13.0–17.0)
MCH: 30.6 pg (ref 26.0–34.0)
MCH: 30 pg (ref 26.0–34.0)
MCHC: 33.7 g/dL (ref 30.0–36.0)
MCHC: 32.9 g/dL (ref 30.0–36.0)
MCV: 91 fL (ref 78.0–100.0)
MCV: 91.3 fL (ref 78.0–100.0)
Platelets: 115 10*3/uL — ABNORMAL LOW (ref 150–400)
Platelets: 104 10*3/uL — ABNORMAL LOW (ref 150–400)
RBC: 3.43 MIL/uL — ABNORMAL LOW (ref 4.22–5.81)
RBC: 3.33 MIL/uL — AB (ref 4.22–5.81)
RDW: 14.1 % (ref 11.5–15.5)
RDW: 14.2 % (ref 11.5–15.5)
WBC: 11.9 10*3/uL — ABNORMAL HIGH (ref 4.0–10.5)
WBC: 10.6 10*3/uL — ABNORMAL HIGH (ref 4.0–10.5)

## 2015-02-12 LAB — BASIC METABOLIC PANEL
Anion gap: 5 (ref 5–15)
BUN: 19 mg/dL (ref 6–20)
CO2: 27 mmol/L (ref 22–32)
Calcium: 7.6 mg/dL — ABNORMAL LOW (ref 8.9–10.3)
Chloride: 106 mmol/L (ref 101–111)
Creatinine, Ser: 1.69 mg/dL — ABNORMAL HIGH (ref 0.61–1.24)
GFR calc Af Amer: 42 mL/min — ABNORMAL LOW (ref >60)
GFR, EST NON AFRICAN AMERICAN: 36 mL/min — AB (ref >60)
Glucose, Bld: 131 mg/dL — ABNORMAL HIGH (ref 65–99)
POTASSIUM: 4.2 mmol/L (ref 3.5–5.1)
SODIUM: 138 mmol/L (ref 135–145)

## 2015-02-12 MED ORDER — ENOXAPARIN SODIUM 30 MG/0.3ML ~~LOC~~ SOLN
30.0000 mg | SUBCUTANEOUS | Status: DC
  Administered 2015-02-12 – 2015-02-19 (×8): 30 mg via SUBCUTANEOUS
  Filled 2015-02-12 (×10): qty 0.3

## 2015-02-12 MED ORDER — ASPIRIN EC 81 MG PO TBEC
81.0000 mg | DELAYED_RELEASE_TABLET | Freq: Every day | ORAL | Status: DC
  Administered 2015-02-12 – 2015-02-20 (×9): 81 mg via ORAL
  Filled 2015-02-12 (×9): qty 1

## 2015-02-12 MED ORDER — INSULIN ASPART 100 UNIT/ML ~~LOC~~ SOLN
0.0000 [IU] | SUBCUTANEOUS | Status: DC
  Administered 2015-02-12 – 2015-02-13 (×2): 2 [IU] via SUBCUTANEOUS

## 2015-02-12 MED ORDER — DIGOXIN 125 MCG PO TABS
0.1250 mg | ORAL_TABLET | Freq: Every day | ORAL | Status: DC
  Administered 2015-02-12 – 2015-02-17 (×6): 0.125 mg via ORAL
  Filled 2015-02-12 (×7): qty 1

## 2015-02-12 MED ORDER — TRAMADOL HCL 50 MG PO TABS
50.0000 mg | ORAL_TABLET | ORAL | Status: DC | PRN
  Administered 2015-02-19: 50 mg via ORAL
  Filled 2015-02-12: qty 1

## 2015-02-12 MED ORDER — FUROSEMIDE 10 MG/ML IJ SOLN
40.0000 mg | Freq: Once | INTRAMUSCULAR | Status: AC
  Administered 2015-02-12: 40 mg via INTRAVENOUS

## 2015-02-12 MED ORDER — ASPIRIN 81 MG PO CHEW
81.0000 mg | CHEWABLE_TABLET | Freq: Every day | ORAL | Status: DC
  Filled 2015-02-12: qty 1

## 2015-02-12 NOTE — Progress Notes (Signed)
Patient ID: [name] [name], male   DOB: Oct 18, 1931, 81 y.o.   MRN: [id number]  SICU Evening Rounds:  Hemodynamically stable on dop 3, milrinone 0.125  Urine output 50/hr.  CBC    Component Value Date/Time   WBC 10.6* 02/12/2015 1645   RBC 3.33* 02/12/2015 1645   HGB 10.0* 02/12/2015 1645   HCT 30.4* 02/12/2015 1645   PLT 104* 02/12/2015 1645   MCV 91.3 02/12/2015 1645   MCH 30.0 02/12/2015 1645   MCHC 32.9 02/12/2015 1645   RDW 14.2 02/12/2015 1645   LYMPHSABS 1.5 06/20/2014 1142   MONOABS 0.7 06/20/2014 1142   EOSABS 0.1 06/20/2014 1142   BASOSABS 0.0 06/20/2014 1142    BMET    Component Value Date/Time   NA 138 02/12/2015 0330   K 4.2 02/12/2015 0330   CL 106 02/12/2015 0330   CO2 27 02/12/2015 0330   GLUCOSE 131* 02/12/2015 0330   BUN 19 02/12/2015 0330   CREATININE 1.73* 02/12/2015 1645   CALCIUM 7.6* 02/12/2015 0330   GFRNONAA 35* 02/12/2015 1645   GFRAA 41* 02/12/2015 1645    A/P: stable. Creat up slightly. Continue present inotropes tonight.

## 2015-02-12 NOTE — Progress Notes (Addendum)
Patient ID: [name] [name], male   DOB: Aug 02, 1932, 81 y.o.   MRN: [id number] TCTS DAILY ICU PROGRESS NOTE                   [address]            [name] [name] [address]          [phone]   1 Day Post-Op Procedure(s) (LRB): AORTIC VALVE REPLACEMENT (AVR) (N/A) CLIPPING OF ATRIAL APPENDAGE (N/A) TRANSESOPHAGEAL ECHOCARDIOGRAM (TEE) (N/A)  Total Length of Stay:  LOS: 1 day   Subjective: Awake and alert , neuro intact, extubated last night  Objective: Vital signs in last 24 hours: Temp:  [96.1 F (35.6 C)-99.5 F (37.5 C)] 97.9 F (36.6 C) (06/15 0700) Pulse Rate:  [51-96] 91 (06/15 0700) Cardiac Rhythm:  [-] A-V Sequential paced (06/15 0600) Resp:  [12-25] 18 (06/15 0700) BP: (88-121)/(57-81) 115/77 mmHg (06/15 0600) SpO2:  [89 %-98 %] 91 % (06/15 0700) Arterial Line BP: (90-136)/(58-75) 118/72 mmHg (06/15 0700) FiO2 (%):  [40 %-55 %] 55 % (06/15 0600) Weight:  [265 lb 3.4 oz (120.3 kg)] 265 lb 3.4 oz (120.3 kg) (06/15 0342)  Filed Weights   02/11/15 0621 02/12/15 0342  Weight: 255 lb (115.667 kg) 265 lb 3.4 oz (120.3 kg)    Weight change: 10 lb 3.4 oz (4.633 kg)   Hemodynamic parameters for last 24 hours: PAP: (26-54)/(7-28) 38/26 mmHg CO:  [3.7 L/min-5.4 L/min] 5.4 L/min CI:  [1.6 L/min/m2-2.3 L/min/m2] 2.3 L/min/m2  Intake/Output from previous day: 06/14 0701 - 06/15 0700 In: 6369.9 [I.V.:4669.9; Blood:500; IV Piggyback:1200] Out: 4989 [Urine:3505; Blood:1100; Chest Tube:384]  Intake/Output this shift:    Current Meds: Scheduled Meds: . acetaminophen  1,000 mg Oral 4 times per day   Or  . acetaminophen (TYLENOL) oral liquid 160 mg/5 mL  1,000 mg Per Tube 4 times per day  . antiseptic oral rinse  7 mL Mouth Rinse BID  . aspirin EC  325 mg Oral Daily   Or  . aspirin  324 mg Per Tube Daily  . atorvastatin  10 mg Oral Daily  . bisacodyl  10 mg Oral Daily   Or  . bisacodyl  10 mg Rectal Daily  . buPROPion  300 mg Oral Daily  .  busPIRone  15 mg Oral BID  . cefUROXime (ZINACEF)  IV  1.5 g Intravenous Q12H  . Chlorhexidine Gluconate Cloth  6 each Topical Q0600  . clonazePAM  0.5 mg Oral QHS  . docusate sodium  200 mg Oral Daily  . dutasteride  0.5 mg Oral Daily  . escitalopram  20 mg Oral Daily  . famotidine (PEPCID) IV  20 mg Intravenous Q12H  . fluticasone  1 spray Each Nare Daily  . insulin regular  0-10 Units Intravenous TID WC  . lamoTRIgine  100 mg Oral BID  . levothyroxine  150 mcg Oral QAC breakfast  . metoCLOPramide (REGLAN) injection  10 mg Intravenous 4 times per day  . metoprolol tartrate  12.5 mg Oral BID   Or  . metoprolol tartrate  12.5 mg Per Tube BID  . mupirocin ointment   Nasal BID  . [START ON 02/13/2015] pantoprazole  40 mg Oral Daily  . sodium chloride  3 mL Intravenous Q12H   Continuous Infusions: . sodium chloride 20 mL/hr at 02/12/15 0700  . sodium chloride 250 mL (02/12/15 0511)  . sodium chloride 20 mL (02/12/15 0012)  . dexmedetomidine Stopped (02/11/15 1732)  .  DOPamine 4 mcg/kg/min (02/12/15 0700)  . insulin (NOVOLIN-R) infusion 2.4 Units/hr (02/12/15 0700)  . lactated ringers    . lactated ringers 20 mL/hr at 02/12/15 0700  . milrinone 0.25 mcg/kg/min (02/12/15 0700)  . nitroGLYCERIN Stopped (02/11/15 1500)  . phenylephrine (NEO-SYNEPHRINE) Adult infusion 15 mcg/min (02/12/15 0700)   PRN Meds:.sodium chloride, albumin human, albuterol, metoprolol, midazolam, morphine injection, ondansetron (ZOFRAN) IV, oxyCODONE, sodium chloride, traMADol  General appearance: alert, cooperative and no distress Neurologic: intact Heart: regular rate and rhythm, S1, S2 normal, no murmur, click, rub or gallop Lungs: diminished breath sounds bibasilar Abdomen: soft, non-tender; bowel sounds normal; no masses,  no organomegaly Extremities: extremities normal, atraumatic, no cyanosis or edema and Homans sign is negative, no sign of DVT Wound: sternum stable Sinus under ddd pacing  Lab  Results: CBC: Recent Labs  02/11/15 2000 02/11/15 2015 02/12/15 0330  WBC 12.8*  --  11.9*  HGB 10.8* 10.9* 10.5*  HCT 32.5* 32.0* 31.2*  PLT 120*  --  115*   BMET:  Recent Labs  02/11/15 2015 02/12/15 0330  NA 140 138  K 4.0 4.2  CL 104 106  CO2  --  27  GLUCOSE 137* 131*  BUN 25* 19  CREATININE 1.60* 1.69*  CALCIUM  --  7.6*    PT/INR:  Recent Labs  02/11/15 1415  LABPROT 18.2*  INR 1.50*   Radiology: Dg Chest Port 1 View  02/11/2015   CLINICAL DATA:  Status post aortic valve replacement today.  EXAM: PORTABLE CHEST - 1 VIEW  COMPARISON:  PA and lateral chest 02/07/2015.  FINDINGS: Endotracheal tube is in place the tip in good position at the level of clavicular heads. Right IJ approach Swan-Ganz catheter is seen with the tip in the distal pulmonary outflow tract. NG tube courses into the stomach and below the inferior margin of the film. Mediastinal drain is noted.  Seven intact median sternotomy wires and an atrial appendage clip are seen. Heart size is enlarged. There is no pulmonary edema. Lung volumes are low.  IMPRESSION: Support apparatus projects in good position.  No acute abnormality.   Electronically Signed   By: [name] [name] M.D.   On: 02/11/2015 14:27     Assessment/Plan: S/P Procedure(s) (LRB): AORTIC VALVE REPLACEMENT (AVR) (N/A) CLIPPING OF ATRIAL APPENDAGE (N/A) TRANSESOPHAGEAL ECHOCARDIOGRAM (TEE) (N/A) Mobilize Diuresis Diabetes control Continue foley due to strict I&O, patient in ICU and urinary output monitoring See progression orders Pt to see Renal function at base line, stge III ckd In sinus now , likely to go back in afib ,a clip applied was on eliquis preop Expected Acute  Blood - loss Anemia On low dose Lovenox for dvt prophylaxis, will stop when back on eliquis   [name] [name] 02/12/2015 7:05 AM

## 2015-02-12 NOTE — Evaluation (Signed)
Physical Therapy Evaluation Patient Details Name: [name] [name] MRN: [id number] DOB: [birth year] Today's Date: 02/12/2015   History of Present Illness  Pt adm for AVR and clipping of atrial appendage. PMH - afib, OSA, brain injury with craniotomy in 1960, HTN. History of frequent falls per 09/2014 chart.  Clinical Impression  Pt admitted with above diagnosis and presents to PT with functional limitations due to deficits listed below (See PT problem list). Pt needs skilled PT to maximize independence and safety to allow discharge to ST-SNF prior to return home.      Follow Up Recommendations SNF    Equipment Recommendations   (to be determined)    Recommendations for Other Services       Precautions / Restrictions Precautions Precautions: Fall Restrictions Weight Bearing Restrictions: Yes (sternal precautions)      Mobility  Bed Mobility Overal bed mobility: Needs Assistance Bed Mobility: Supine to Sit     Supine to sit: +2 for physical assistance;Max assist     General bed mobility comments: Assist to bring trunk up into sitting  Transfers Overall transfer level: Needs assistance Equipment used: 2 person hand held assist Transfers: Sit to/from Stand;Stand Pivot Transfers Sit to Stand: +2 physical assistance;Mod assist Stand pivot transfers: +2 physical assistance;Mod assist       General transfer comment: Assist to bring hips up and for balance.  Ambulation/Gait                Stairs            Wheelchair Mobility    Modified Rankin (Stroke Patients Only)       Balance Overall balance assessment: History of Falls;Needs assistance Sitting-balance support: No upper extremity supported;Feet supported Sitting balance-Leahy Scale: Fair     Standing balance support: Bilateral upper extremity supported Standing balance-Leahy Scale: Poor                               Pertinent Vitals/Pain Pain Assessment: Faces Faces Pain  Scale: Hurts little more Pain Location: chest incision Pain Descriptors / Indicators: Grimacing;Operative site guarding Pain Intervention(s): Limited activity within patient's tolerance;Repositioned    Home Living Family/patient expects to be discharged to:: Skilled nursing facility Living Arrangements: Spouse/significant other Available Help at Discharge: Family (wife unable to physically assist pt) Type of Home: House Home Access: Stairs to enter Entrance Stairs-Rails: None Entrance Stairs-Number of Steps: 4 Home Layout: Multi-level Home Equipment: Environmental consultant - 4 wheels;Walker - 2 wheels;Cane - quad;Cane - single point      Prior Function Level of Independence: Independent         Comments: Pt reports he was amb without assistive device. History of frequent falls.     Hand Dominance   Dominant Hand: Right    Extremity/Trunk Assessment   Upper Extremity Assessment: Generalized weakness           Lower Extremity Assessment: Generalized weakness         Communication   Communication: No difficulties  Cognition Arousal/Alertness: Awake/alert Behavior During Therapy: Impulsive Overall Cognitive Status: No family/caregiver present to determine baseline cognitive functioning Area of Impairment: Safety/judgement;Problem solving;Attention   Current Attention Level: Sustained     Safety/Judgement: Decreased awareness of safety;Decreased awareness of deficits   Problem Solving: Requires verbal cues      General Comments      Exercises        Assessment/Plan    PT Assessment Patient needs continued PT  services  PT Diagnosis Difficulty walking;Generalized weakness   PT Problem List Decreased strength;Decreased activity tolerance;Decreased balance;Decreased mobility;Decreased knowledge of use of DME;Decreased knowledge of precautions  PT Treatment Interventions DME instruction;Gait training;Functional mobility training;Therapeutic activities;Therapeutic  exercise;Balance training;Patient/family education   PT Goals (Current goals can be found in the Care Plan section) Acute Rehab PT Goals Patient Stated Goal: return home PT Goal Formulation: With patient Time For Goal Achievement: 02/26/15 Potential to Achieve Goals: Good    Frequency Min 3X/week   Barriers to discharge        Co-evaluation               End of Session Equipment Utilized During Treatment: Oxygen Activity Tolerance: Patient tolerated treatment well Patient left: in chair;with call bell/phone within reach;with nursing/sitter in room Nurse Communication: Mobility status (nurse assisted with transfer)         Time: 0920-0932 PT Time Calculation (min) (ACUTE ONLY): 12 min   Charges:   PT Evaluation $Initial PT Evaluation Tier I: 1 Procedure     PT G Codes:        Carisa Backhaus 24-Feb-2015, 10:51 AM  Skip Mayer PT [phone]

## 2015-02-13 ENCOUNTER — Inpatient Hospital Stay (HOSPITAL_COMMUNITY): Payer: Medicare Other

## 2015-02-13 LAB — BASIC METABOLIC PANEL
Anion gap: 6 (ref 5–15)
BUN: 16 mg/dL (ref 6–20)
CO2: 29 mmol/L (ref 22–32)
Calcium: 7.9 mg/dL — ABNORMAL LOW (ref 8.9–10.3)
Chloride: 102 mmol/L (ref 101–111)
Creatinine, Ser: 1.63 mg/dL — ABNORMAL HIGH (ref 0.61–1.24)
GFR calc Af Amer: 44 mL/min — ABNORMAL LOW (ref >60)
GFR calc non Af Amer: 38 mL/min — ABNORMAL LOW (ref >60)
Glucose, Bld: 127 mg/dL — ABNORMAL HIGH (ref 65–99)
Potassium: 4 mmol/L (ref 3.5–5.1)
Sodium: 137 mmol/L (ref 135–145)

## 2015-02-13 LAB — CBC
HCT: 28.3 % — ABNORMAL LOW (ref 39.0–52.0)
Hemoglobin: 9.4 g/dL — ABNORMAL LOW (ref 13.0–17.0)
MCH: 30.5 pg (ref 26.0–34.0)
MCHC: 33.2 g/dL (ref 30.0–36.0)
MCV: 91.9 fL (ref 78.0–100.0)
Platelets: 92 10*3/uL — ABNORMAL LOW (ref 150–400)
RBC: 3.08 MIL/uL — ABNORMAL LOW (ref 4.22–5.81)
RDW: 14.3 % (ref 11.5–15.5)
WBC: 10.1 10*3/uL (ref 4.0–10.5)

## 2015-02-13 LAB — GLUCOSE, CAPILLARY
Glucose-Capillary: 114 mg/dL — ABNORMAL HIGH (ref 65–99)
Glucose-Capillary: 106 mg/dL — ABNORMAL HIGH (ref 65–99)
Glucose-Capillary: 126 mg/dL — ABNORMAL HIGH (ref 65–99)
Glucose-Capillary: 111 mg/dL — ABNORMAL HIGH (ref 65–99)
Glucose-Capillary: 110 mg/dL — ABNORMAL HIGH (ref 65–99)
Glucose-Capillary: 123 mg/dL — ABNORMAL HIGH (ref 65–99)

## 2015-02-13 MED ORDER — POTASSIUM CHLORIDE CRYS ER 20 MEQ PO TBCR
40.0000 meq | EXTENDED_RELEASE_TABLET | Freq: Once | ORAL | Status: AC
  Administered 2015-02-13: 40 meq via ORAL
  Filled 2015-02-13: qty 2

## 2015-02-13 MED ORDER — INSULIN ASPART 100 UNIT/ML ~~LOC~~ SOLN: 0.0000 [IU] | Freq: Three times a day (TID) | SUBCUTANEOUS | Status: DC

## 2015-02-13 MED ORDER — FUROSEMIDE 10 MG/ML IJ SOLN
80.0000 mg | Freq: Once | INTRAMUSCULAR | Status: AC
  Administered 2015-02-13: 80 mg via INTRAVENOUS
  Filled 2015-02-13: qty 8

## 2015-02-13 MED FILL — Heparin Sodium (Porcine) Inj 1000 Unit/ML: INTRAMUSCULAR | Qty: 30 | Status: AC

## 2015-02-13 MED FILL — Magnesium Sulfate Inj 50%: INTRAMUSCULAR | Qty: 10 | Status: AC

## 2015-02-13 MED FILL — Potassium Chloride Inj 2 mEq/ML: INTRAVENOUS | Qty: 40 | Status: AC

## 2015-02-13 NOTE — Progress Notes (Signed)
2 Days Post-Op Procedure(s) (LRB): AORTIC VALVE REPLACEMENT (AVR) (N/A) CLIPPING OF ATRIAL APPENDAGE (N/A) TRANSESOPHAGEAL ECHOCARDIOGRAM (TEE) (N/A) Subjective:  No complaints  Still on dop 3, Milrinone 0.125. Hemodynamically stable.  Objective: Vital signs in last 24 hours: Temp:  [96.9 F (36.1 C)-98.7 F (37.1 C)] 97.6 F (36.4 C) (06/16 0900) Pulse Rate:  [65-93] 65 (06/16 1000) Cardiac Rhythm:  [-] A-V Sequential paced (06/16 0804) Resp:  [18-27] 20 (06/16 1000) BP: (84-136)/(44-91) 126/67 mmHg (06/16 1000) SpO2:  [89 %-99 %] 95 % (06/16 1000) Arterial Line BP: (103-165)/(59-70) 110/62 mmHg (06/15 1800) Weight:  [119.2 kg (262 lb 12.6 oz)] 119.2 kg (262 lb 12.6 oz) (06/16 0500)  Hemodynamic parameters for last 24 hours:    Intake/Output from previous day: 06/15 0701 - 06/16 0700 In: 976.8 [P.O.:100; I.V.:826.8; IV Piggyback:50] Out: 2115 [Urine:2065; Chest Tube:50] Intake/Output this shift: Total I/O In: 111.9 [I.V.:111.9] Out: 100 [Urine:100]  General appearance: alert and cooperative Neurologic: intact Heart: regular rate and rhythm, S1, S2 normal, no murmur, click, rub or gallop Lungs: clear to auscultation bilaterally Abdomen: soft, non-tender; bowel sounds normal; no masses,  no organomegaly Extremities: edema mild Wound: aquacel in place  Lab Results:  Recent Labs  02/12/15 1645 02/13/15 0415  WBC 10.6* 10.1  HGB 10.0* 9.4*  HCT 30.4* 28.3*  PLT 104* 92*   BMET:  Recent Labs  02/12/15 0330 02/12/15 1639 02/12/15 1645 02/13/15 0415  NA 138 138  --  137  K 4.2 4.0  --  4.0  CL 106 101  --  102  CO2 27  --   --  29  GLUCOSE 131* 142*  --  127*  BUN 19 19  --  16  CREATININE 1.69* 1.70* 1.73* 1.63*  CALCIUM 7.6*  --   --  7.9*    PT/INR:  Recent Labs  02/11/15 1415  LABPROT 18.2*  INR 1.50*   ABG    Component Value Date/Time   PHART 7.331* 02/12/2015 0334   HCO3 24.3* 02/12/2015 0334   TCO2 21 02/12/2015 1639   ACIDBASEDEF  2.0 02/12/2015 0334   O2SAT 93.0 02/12/2015 0334   CBG (last 3)   Recent Labs  02/12/15 2306 02/13/15 0407 02/13/15 0847  GLUCAP 114* 106* 126*    CLINICAL DATA: Post aortic valve replaced, followup  EXAM: PORTABLE CHEST - 1 VIEW  COMPARISON: Portable chest x-ray of 02/12/2015  FINDINGS: The lungs appear slightly better aerated with some improvement in bibasilar atelectasis. Moderate cardiomegaly is stable. The Swan-Ganz catheter has been removed and a venous sheath remains in the SVC. NG tube is present coursing below the hemidiaphragm.  IMPRESSION: Slightly better aeration with some decrease in basilar atelectasis. Swan-Ganz catheter removed.   Electronically Signed  By: Dwyane Dee M.D.  On: 02/13/2015 08:04    Assessment/Plan: S/P Procedure(s) (LRB): AORTIC VALVE REPLACEMENT (AVR) (N/A) CLIPPING OF ATRIAL APPENDAGE (N/A) TRANSESOPHAGEAL ECHOCARDIOGRAM (TEE) (N/A)  He has been hemodynamically stable. Wean off milrinone and dopamine. EF reportedly 55% on TEE in OR.  Stage 3 CKD: Creat is stable. Diurese volume excess  H/O atrial fib. He is sinus at 60 now. Will discontinue lopressor and continue digoxin he was on preop. AAI pace at 80 for now to maximize CO.  OOB, IS   LOS: 2 days    Alleen Borne 02/13/2015

## 2015-02-13 NOTE — Progress Notes (Signed)
      [address]       [place] [address]             [phone]      POD # 2  No new issues today  BP 126/73 mmHg  Pulse 79  Temp(Src) 97.6 F (36.4 C) (Oral)  Resp 15  Ht 6' (1.829 m)  Wt 262 lb 12.6 oz (119.2 kg)  BMI 35.63 kg/m2  SpO2 100%  Intake/Output Summary (Last 24 hours) at 02/13/15 1919 Last data filed at 02/13/15 1900  Gross per 24 hour  Intake 1303.8 ml  Output   1450 ml  Net -146.2 ml   Doing well POD # 2  Viviann Spare C. Dorris Fetch, MD [facility] [phone]

## 2015-02-14 LAB — BASIC METABOLIC PANEL
Anion gap: 6 (ref 5–15)
BUN: 22 mg/dL — ABNORMAL HIGH (ref 6–20)
CO2: 29 mmol/L (ref 22–32)
CREATININE: 1.76 mg/dL — AB (ref 0.61–1.24)
Calcium: 7.9 mg/dL — ABNORMAL LOW (ref 8.9–10.3)
Chloride: 102 mmol/L (ref 101–111)
GFR calc non Af Amer: 35 mL/min — ABNORMAL LOW (ref >60)
GFR, EST AFRICAN AMERICAN: 40 mL/min — AB (ref >60)
GLUCOSE: 89 mg/dL (ref 65–99)
Potassium: 4.2 mmol/L (ref 3.5–5.1)
SODIUM: 137 mmol/L (ref 135–145)

## 2015-02-14 LAB — GLUCOSE, CAPILLARY
Glucose-Capillary: 93 mg/dL (ref 65–99)
Glucose-Capillary: 98 mg/dL (ref 65–99)
Glucose-Capillary: 75 mg/dL (ref 65–99)
Glucose-Capillary: 121 mg/dL — ABNORMAL HIGH (ref 65–99)

## 2015-02-14 MED ORDER — DILTIAZEM HCL 30 MG PO TABS
30.0000 mg | ORAL_TABLET | Freq: Four times a day (QID) | ORAL | Status: DC
  Administered 2015-02-14 – 2015-02-15 (×3): 30 mg via ORAL
  Filled 2015-02-14 (×7): qty 1

## 2015-02-14 MED ORDER — AMLODIPINE BESYLATE 5 MG PO TABS
5.0000 mg | ORAL_TABLET | Freq: Every day | ORAL | Status: DC
  Administered 2015-02-14: 5 mg via ORAL
  Filled 2015-02-14: qty 1

## 2015-02-14 NOTE — Clinical Social Work Note (Signed)
Clinical Social Work Assessment  Patient Details  Name: [name] [name] MRN: [id number] Date of Birth: 1932/07/01  Date of referral:  02/14/15               Reason for consult:  Facility Placement                Permission sought to share information with:  Oceanographer granted to share information::  Yes, Verbal Permission Granted  Name::        Agency::  [place] [facility]  Relationship::     Contact Information:     Housing/Transportation Living arrangements for the past 2 months:  Single Family Home Source of Information:  Patient Patient Interpreter Needed:  None Criminal Activity/Legal Involvement Pertinent to Current Situation/Hospitalization:  No - Comment as needed Significant Relationships:  Spouse Lives with:  Spouse Do you feel safe going back to the place where you live?  Yes Need for family participation in patient care:  No (Coment)  Care giving concerns:  Pt lives at home with spouse who is unable to offer much physical assistance   Office manager / plan:  CSW spoke with pt about PT recommendation for SNF  Employment status:  Retired Health and safety inspector:  Medicare PT Recommendations:  Skilled Nursing Facility Information / Referral to community resources:  Skilled Nursing Facility  Patient/Family's Response to care:  Pt is agreeable to going to SNF for short term rehab- states that he was at SNF a few months ago   Teacher, adult education) and would be willing to return to that same facility if available  Patient/Family's Understanding of and Emotional Response to Diagnosis, Current Treatment, and Prognosis:  Pt has no questions or concerns at this time  Emotional Assessment Appearance:  Appears stated age Attitude/Demeanor/Rapport:    Affect (typically observed):  Appropriate, Pleasant Orientation:  Oriented to Self, Oriented to Place, Oriented to  Time, Oriented to Situation Alcohol / Substance use:  Not Applicable Psych  involvement (Current and /or in the community):  No (Comment)  Discharge Needs  Concerns to be addressed:  Discharge Planning Concerns Readmission within the last 30 days:  No Current discharge risk:  Physical Impairment Barriers to Discharge:  Continued Medical Work up   [employer], LCSW 02/14/2015, 2:42 PM

## 2015-02-14 NOTE — Progress Notes (Signed)
Patient examined and record reviewed.Hemodynamics stable,labs satisfactory.Patient had stable day.Continue current care. Kathlee Nations Trigt III 02/14/2015

## 2015-02-14 NOTE — Discharge Instructions (Signed)
Activity: 1.May walk up steps                2.No lifting more than ten pounds for four weeks.                 3.No driving for four weeks.                4.Stop any activity that causes chest pain, shortness of breath, dizziness, sweating or excessive weakness.                5.Avoid straining.                6.Continue with your breathing exercises daily.  Diet: Low fat, Low salt,sugar diet  Wound Care: May shower.  Clean wounds with mild soap and water daily. Contact the office at [phone] if any problems arise.  Aortic Valve Replacement, Care After Refer to this sheet in the next few weeks. These instructions provide you with information on caring for yourself after your procedure. Your health care provider may also give you specific instructions. Your treatment has been planned according to current medical practices, but problems sometimes occur. Call your health care provider if you have any problems or questions after your procedure. HOME CARE INSTRUCTIONS   Take medicines only as directed by your health care provider.  If your health care provider has prescribed elastic stockings, wear them as directed.  Take frequent naps or rest often throughout the day.  Avoid lifting over 10 lbs (4.5 kg) or pushing or pulling things with your arms for 6-8 weeks or as directed by your health care provider.  Avoid driving or airplane travel for 4-6 weeks after surgery or as directed by your health care provider. If you are riding in a car for an extended period, stop every 1-2 hours to stretch your legs. Keep a record of your medicines and medical history with you when traveling.  Do not drive or operate heavy machinery while taking pain medicine. (narcotics).  Do not cross your legs.  Do not use any tobacco products including cigarettes, chewing tobacco, or electronic cigarettes. If you need help quitting, ask your health care provider.  Do not take baths, swim, or use a hot tub until  your health care provider approves. Take showers once your health care provider approves. Pat incisions dry. Do not rub incisions with a washcloth or towel.  Avoid climbing stairs and using the handrail to pull yourself up for the first 2-3 weeks after surgery.  Return to work as directed by your health care provider.  Drink enough fluid to keep your urine clear or pale yellow.  Do not strain to have a bowel movement. Eat high-fiber foods if you become constipated. You may also take a medicine to help you have a bowel movement (laxative) as directed by your health care provider.  Resume sexual activity as directed by your health care provider. Men should not use medicines for erectile dysfunction until their doctor says it isokay.  If you had a certain type of heart condition in the past, you may need to take antibiotic medicine before having dental work or surgery. Let your dentist and health care providers know if you had one or more of the following:  Previous endocarditis.  An artificial (prosthetic) heart valve.  Congenital heart disease. SEEK MEDICAL CARE IF:  You develop a skin rash.   You experience sudden changes in your weight.  You have a fever. SEEK IMMEDIATE MEDICAL  CARE IF:   You develop chest pain that is not coming from your incision.  You have drainage (pus), redness, swelling, or pain at your incision site.   You develop shortness of breath or have difficulty breathing.   You have increased bleeding from your incision site.   You develop light-headedness.  MAKE SURE YOU:   Understand these directions.  Will watch your condition.  Will get help right away if you are not doing well or get worse. Document Released: 03/04/2005 Document Revised: 12/31/2013 Document Reviewed: 05/30/2012 [facility] Patient Information 2015 [place], [place]. This information is not intended to replace advice given to you by your health care provider. Make sure you discuss any  questions you have with your health care provider.

## 2015-02-14 NOTE — Discharge Summary (Signed)
Physician Discharge Summary       [address]       [name] [address]             [phone]    Patient ID: [name] [name] MRN: [id number] DOB/AGE: 1932-08-30 81 y.o.  Admit date: 02/11/2015 Discharge date: 02/20/2015  Admission Diagnoses: 1. Severe aortic insufficiency 2. Coronary artery disease 3. History of atrial fibrillation 4. History of hypertension 5. History of hypercholesteremia 6. History of bilateral DVT 7. History of COPD 8. History of OSA 9. History of CKD 10. History of CKD 11. History of enlarged prostate 12. History of GERD 13. History of hypothyroidism 14. History of varicose veins 15. History of depression  Discharge Diagnoses:  1. Severe aortic insufficiency 2. Coronary artery disease 3. History of atrial fibrillation 4. History of hypertension 5. History of hypercholesteremia 6. History of bilateral DVT 7. History of COPD 8. History of OSA 9. History of CKD 10. History of enlarged prostate 11 History of GERD 12. History of hypothyroidism 13. History of varicose veins 14. History of depression 15. Probable early pancreatitis  Procedure (s):  AORTIC VALVE REPLACEMENT  -27 mm Edwards Magna Ease Pericardial Tissue Valve  CLIPPING OF ATRIAL APPENDAGE  -35 mm Atricure Clip  ENDOSCOPIC HARVEST GREATER SAPHENOUS VEIN  -Right leg- unsuitable for conduit -Left leg- explored, no suitable conduit identified TRANSESOPHAGEAL ECHOCARDIOGRAM (TEE) by Dr. Tyrone Sage on 02/11/2015.  History of Presenting Illness: This is an 81 y.o. male is seen in the office concerning aortic valve replacement and coronary artery bypass grafting.He has been followed at [facility] for many years with aortic valve insufficiency atrial fibrillation and hypertension. The patient was hospitalized at [facility] in January with increasing dyspnea on exertion and difficulty carrying objects and caring for his usual activities. He underwent further evaluation  for consideration of surgery including cardiac cath CT scan of the chest and echocardiogram. At that time, it was recommended to him that he needed an aortic valve replacement and coronary artery bypass but was not felt to be strong enough and was discharge to a nursing home. He saw me 4 weeks ago for an opinion concerningcardiac surgery . At that time, he was still very weak and not independent . Since then has improved and is now living independently.   Overall, he has had marked improvement from the first visit [place] on. He has seen neurology , he was not able to have MRI because of metal in his brain.  Dr. Tyrone Sage discussed the need for an aortic valve replacement, coronary artery bypass grafting surgery, and clipping of the left atrial appendage. Potential risks, benefits, and complications were discussed with the patient and he agreed to proceed with surgery. Pre operative carotid duplex US showed a 40-59% right internal carotid artery stenosis and no significant left internal carotid artery stenosis. He was admitted on 06/14 in order to undergo an AVR and clipping of LA appendage. Unfortunately, his greater saphenous veins were not usable bilaterally.  Brief Hospital Course:  The patient was extubated the evening of surgery without difficulty. He remained afebrile and hemodynamically stable. She was weaned off of Dopamine and Milrinone drips. She was initially AAI paced. Theone Murdoch, a line, chest tubes, and foley were removed early in the post operative course. He was volume over loaded and diuresed. He has a history of CKD. His pre op creatinine was 2.03. His last creatinine was stable at 1.54.Marland Kitchen He had ABL anemia. He did not require a post op  transfusion. His last H and H was 9.1 and 27.9. He was weaned off the insulin drip. The patient's glucose remained well controlled. The patient's HGA1C pre op was 6.2. He will need to follow up with his primary care physician after discharge for further  surveillance of HGA1C as he is pre diabetic. The patient was felt surgically stable for transfer from the ICU to PCTU for further convalescence on 02/17/2015. He continues to progress with cardiac rehab. He was ambulating on room air. He has been tolerating a diet and has had a bowel movement. He developed nausea and emesis. He had no abdominal pain but stated he had pancreatitis in the past. This is how he felt with those episodes. He was made NPO. Lipase was elevated at 82 but gradually declined. [place] of RUQ showed a fatty liver, previous cholecystectomy, and small right pleural effusion. His symptoms resolved. He was started on clear liquids and his diet was advanced as tolerated. Epicardial pacing wires and chest tube sutures will be removed prior to discharge. He was restarted on Eliquis on 02/20/2015.Per Dr. Tyrone Sage, the patient is felt surgically stable for discharge to CIR today.   Latest Vital Signs: Blood pressure 158/100, pulse 87, temperature 97.7 F (36.5 C), temperature source Oral, resp. rate 18, height 6' (1.829 m), weight 251 lb 5.2 oz (114 kg), SpO2 93 %.  Physical Exam: Cardiovascular: IRRR IRRR Pulmonary: Clear to auscultation bilaterally; no rales, wheezes, or rhonchi. Abdomen: Soft, non tender, hypoactive bowel sounds present. Extremities: No lower extremity edema. Wounds: Clean and dry. No erythema or signs of infection.  Discharge Condition:Stable and discharged to SNF.  Recent laboratory studies:  Lab Results  Component Value Date   WBC 6.1 02/18/2015   HGB 9.4 02/18/2015   HCT 29.3 02/18/2015   MCV 94.5 02/18/2015   PLT 238 02/18/2015   Lab Results  Component Value Date   NA 138 02/20/2015   K 3.9 02/20/2015   CL 104 02/20/2015   CO2 27 02/20/2015   CREATININE 1.54* 02/20/2015   GLUCOSE 89 02/20/2015    Diagnostic Studies:  EXAM: US ABDOMEN LIMITED - RIGHT UPPER QUADRANT  COMPARISON: 11/12/2009  FINDINGS: Gallbladder:  Surgically  removed  Common bile duct:  Diameter: 7.9 mm.  Liver:  Mild increased echogenicity without focal mass lesion. This is likely related to fatty infiltration.  Note is made of a small right-sided pleural effusion.  IMPRESSION: Small right pleural effusion.  Mild fatty infiltration of the liver.  Status post cholecystectomy.   Electronically Signed  By: Alcide Clever M.D.  On: 02/18/2015 16:40  EXAM: CHEST 2 VIEW  COMPARISON: 02/13/2015  FINDINGS: Central venous catheter tip has been removed. Improved lung volumes with decrease in bibasilar atelectasis. Small bilateral effusions remain. No edema. Prior heart surgery.  IMPRESSION: Improved aeration of the lungs. Decrease in bibasilar atelectasis. Small bilateral effusions remain.   Electronically Signed  By: Marlan Palau M.D.  On: 02/16/2015 08:23    Discharge Medications:   Medication List    STOP taking these medications        digoxin 0.125 MG tablet  Commonly known as:  LANOXIN     diltiazem 120 MG 24 hr capsule  Commonly known as:  TIAZAC     eszopiclone 3 MG Tabs  Generic drug:  Eszopiclone     lisinopril 40 MG tablet  Commonly known as:  PRINIVIL,ZESTRIL      TAKE these medications        acetaminophen 325 MG tablet  Commonly known as:  TYLENOL  Take 2 tablets (650 mg total) by mouth every 6 (six) hours as needed for mild pain (or Fever >/= 101).     albuterol 108 (90 BASE) MCG/ACT inhaler  Commonly known as:  PROVENTIL HFA;VENTOLIN HFA  Inhale 2 puffs into the lungs every 6 (six) hours as needed for wheezing or shortness of breath.     amLODipine 5 MG tablet  Commonly known as:  NORVASC  Take 5 mg by mouth daily.     apixaban 2.5 MG Tabs tablet  Commonly known as:  ELIQUIS  Take 1 tablet (2.5 mg total) by mouth 2 (two) times daily.     aspirin 81 MG EC tablet  Take 1 tablet (81 mg total) by mouth daily.     atorvastatin 10 MG tablet  Commonly known as:   LIPITOR  Take 10 mg by mouth daily.     buPROPion 300 MG 24 hr tablet  Commonly known as:  WELLBUTRIN XL  Take 300 mg by mouth daily.     busPIRone 15 MG tablet  Commonly known as:  BUSPAR  Take 15 mg by mouth 2 (two) times daily.     calcium carbonate 500 MG chewable tablet  Commonly known as:  TUMS - dosed in mg elemental calcium  Chew 1 tablet by mouth as needed for indigestion or heartburn.     clonazePAM 0.5 MG tablet  Commonly known as:  KLONOPIN  Take 1 tablet (0.5 mg total) by mouth at bedtime as needed for anxiety.     diltiazem 60 MG 12 hr capsule  Commonly known as:  CARDIZEM SR  Take 1 capsule (60 mg total) by mouth every 12 (twelve) hours.     dutasteride 0.5 MG capsule  Commonly known as:  AVODART  Take 0.5 mg by mouth daily.     escitalopram 20 MG tablet  Commonly known as:  LEXAPRO  Take 20 mg by mouth daily.     fluticasone 50 MCG/ACT nasal spray  Commonly known as:  FLONASE  Place 1 spray into both nostrils daily.     folic acid 1 MG tablet  Commonly known as:  FOLVITE  Take 1 mg by mouth daily.     lamoTRIgine 100 MG tablet  Commonly known as:  LAMICTAL  Take 100 mg by mouth 2 (two) times daily. Breakfast and dinner     levothyroxine 150 MCG tablet  Commonly known as:  SYNTHROID, LEVOTHROID  Take 150 mcg by mouth daily before breakfast.     Melatonin 3 MG Tabs  Take 1 tablet by mouth at bedtime.     traMADol 50 MG tablet  Commonly known as:  ULTRAM  Take 1 tablet (50 mg total) by mouth every 4 (four) hours as needed for moderate pain.       The patient has been discharged on:   1.Beta Blocker:  Yes [   ]                              No   [ x  ]                              If No, reason:On Cardizem and had bradycardia  2.Ace Inhibitor/ARB: Yes [   ]  No  [ x   ]                                     If No, reason:CKD  3.Statin:   Yes [ x  ]                  No  [   ]                  If No,  reason:  4.Ecasa:  Yes  [  x ]                  No   [   ]                  If No, reason:  Follow Up Appointments: Follow-up Information    Follow up with Nahser, Deloris Ping, MD.   Specialty:  Cardiology   Why:  Call for an appointment for 2 weeks   Contact information:   [address] [place] [address] [phone]       Follow up with [name], MD On 03/20/2015.   Specialty:  Cardiothoracic Surgery   Why:  PA/LAT CXR to be taken (at [facility] which is in the same building as Dr. Dennie Maizes office) on 03/20/2015 at 11:45 am;Appointment time is at 12:30 pm   Contact information:   [address] [employer] [address] [place] [address] [phone]       Follow up with FULP, CAMMIE, MD.   Specialty:  Family Medicine   Why:  Call for follow up appointment for further surveillance of HGA1C 6.2 (pre diabetic)   Contact information:   [address] [place] [address] [phone]       Signed: Doree Fudge MPA-C 02/20/2015, 1:53 PM

## 2015-02-14 NOTE — Clinical Social Work Placement (Signed)
   CLINICAL SOCIAL WORK PLACEMENT  NOTE  Date:  02/14/2015  Patient Details  Name: [name] [name] MRN: [id number] Date of Birth: 12/28/1931  Clinical Social Work is seeking post-discharge placement for this patient at the Skilled  Nursing Facility level of care (*CSW will initial, date and re-position this form in  chart as items are completed):  Yes   Patient/family provided with [place] [facility] Clinical Social Work Department's list of facilities offering this level of care within the geographic area requested by the patient (or if unable, by the patient's family).  Yes   Patient/family informed of their freedom to choose among providers that offer the needed level of care, that participate in Medicare, Medicaid or managed care program needed by the patient, have an available bed and are willing to accept the patient.  Yes   Patient/family informed of [facility]'s ownership interest in [facility] and [facility], as well as of the fact that they are under no obligation to receive care at these facilities.  PASRR submitted to EDS on       PASRR number received on       Existing PASRR number confirmed on 02/14/15     FL2 transmitted to all facilities in geographic area requested by pt/family on 02/14/15     FL2 transmitted to all facilities within larger geographic area on       Patient informed that his/her managed care company has contracts with or will negotiate with certain facilities, including the following:            Patient/family informed of bed offers received.  Patient chooses bed at       Physician recommends and patient chooses bed at      Patient to be transferred to   on  .  Patient to be transferred to facility by       Patient family notified on   of transfer.  Name of family member notified:        PHYSICIAN Please sign FL2     Additional Comment:    _______________________________________________ Izora Ribas, LCSW 02/14/2015, 2:47  PM

## 2015-02-14 NOTE — Progress Notes (Signed)
Physical Therapy Treatment Patient Details Name: SHAUNA VARELAS MRN: [id number] DOB: [birth year] Today's Date: 02/14/2015    History of Present Illness Pt adm for AVR and clipping of atrial appendage. PMH - afib, OSA, brain injury with craniotomy in 1960, HTN. History of frequent falls per 09/2014 chart.    PT Comments    Excellent progress. Continues to require cues for maintaining sternal precautions. Sit to stand progressing with less assistance; bed mobility continues to be most difficult. Wife present (in transport chair) at beginning of session. Not anticipated to achieve supervision level during acute stay--agree with SNF for short-term rehab.   Follow Up Recommendations  SNF     Equipment Recommendations   (to be determined)    Recommendations for Other Services       Precautions / Restrictions Precautions Precautions: Fall;Sternal    Mobility  Bed Mobility Overal bed mobility: Needs Assistance Bed Mobility: Sit to Sidelying         Sit to sidelying: Min assist;+2 for physical assistance;+2 for safety/equipment General bed mobility comments: vc for technique, min assist to control descent to his side  Transfers Overall transfer level: Needs assistance Equipment used: Rolling walker (2 wheeled) Transfers: Sit to/from Stand Sit to Stand: +2 physical assistance;Min assist;+2 safety/equipment         General transfer comment: vc for sternal precautions; Assist to bring hips up and for balance.  Ambulation/Gait Ambulation/Gait assistance: Min assist;+2 safety/equipment Ambulation Distance (Feet): 400 Feet Assistive device: Rolling walker (2 wheeled) Gait Pattern/deviations: Step-through pattern;Drifts right/left     General Gait Details: good cadence and step length; vc for proper use of RW   Stairs            Wheelchair Mobility    Modified Rankin (Stroke Patients Only)       Balance   Sitting-balance support: No upper extremity  supported;Feet supported Sitting balance-Leahy Scale: Fair     Standing balance support: Bilateral upper extremity supported;During functional activity Standing balance-Leahy Scale: Poor                      Cognition Arousal/Alertness: Awake/alert Behavior During Therapy: Impulsive Overall Cognitive Status: No family/caregiver present to determine baseline cognitive functioning Area of Impairment: Safety/judgement;Problem solving;Attention   Current Attention Level: Sustained Memory:  (able to recall precautions)   Safety/Judgement: Decreased awareness of safety;Decreased awareness of deficits   Problem Solving: Requires verbal cues General Comments: required vc for sequencing with transfers to maintain sternal precautions    Exercises General Exercises - Lower Extremity Ankle Circles/Pumps: AROM;Both;10 reps    General Comments        Pertinent Vitals/Pain HR 80-88 SaO2 92-95% on 2L O2  Pain Assessment: No/denies pain    Home Living                      Prior Function            PT Goals (current goals can now be found in the care plan section) Acute Rehab PT Goals Patient Stated Goal: return home Time For Goal Achievement: 02/26/15 Progress towards PT goals: Progressing toward goals    Frequency  Min 3X/week    PT Plan Current plan remains appropriate    Co-evaluation             End of Session Equipment Utilized During Treatment: Oxygen;Gait belt Activity Tolerance: Patient tolerated treatment well Patient left: with call bell/phone within reach;with nursing/sitter in room;in bed  Time: 8119-1478 PT Time Calculation (min) (ACUTE ONLY): 23 min  Charges:  $Gait Training: 23-37 mins                    G Codes:      Danyelle Brookover 2015-02-24, 12:31 PM Pager [phone]

## 2015-02-14 NOTE — Progress Notes (Addendum)
TCTS DAILY ICU PROGRESS NOTE                   [address]            [place] [address]          [phone]   3 Days Post-Op Procedure(s) (LRB): AORTIC VALVE REPLACEMENT (AVR) (N/A) CLIPPING OF ATRIAL APPENDAGE (N/A) TRANSESOPHAGEAL ECHOCARDIOGRAM (TEE) (N/A)  Total Length of Stay:  LOS: 3 days   Subjective: Feels well, no complaints.   Objective: Vital signs in last 24 hours: Temp:  [97.3 F (36.3 C)-99.4 F (37.4 C)] 98.2 F (36.8 C) (06/17 0822) Pulse Rate:  [78-87] 79 (06/17 1100) Cardiac Rhythm:  [-] Atrial paced (06/17 0753) Resp:  [14-23] 15 (06/17 1100) BP: (86-150)/(58-106) 113/94 mmHg (06/17 1100) SpO2:  [93 %-100 %] 99 % (06/17 1100) Weight:  [262 lb 5.6 oz (119 kg)] 262 lb 5.6 oz (119 kg) (06/17 0500)  Filed Weights   02/12/15 0342 02/13/15 0500 02/14/15 0500  Weight: 265 lb 3.4 oz (120.3 kg) 262 lb 12.6 oz (119.2 kg) 262 lb 5.6 oz (119 kg)    Weight change: -7.1 oz (-0.2 kg)     CBGs 469-62-95  Intake/Output from previous day: 06/16 0701 - 06/17 0700 In: 1004.2 [P.O.:680; I.V.:324.2] Out: 1375 [Urine:1375]  Intake/Output this shift: Total I/O In: 240 [P.O.:240] Out: 340 [Urine:340]  Current Meds: Scheduled Meds: . acetaminophen  1,000 mg Oral 4 times per day   Or  . acetaminophen (TYLENOL) oral liquid 160 mg/5 mL  1,000 mg Per Tube 4 times per day  . aspirin EC  81 mg Oral Daily   Or  . aspirin  81 mg Per Tube Daily  . atorvastatin  10 mg Oral Daily  . bisacodyl  10 mg Oral Daily   Or  . bisacodyl  10 mg Rectal Daily  . buPROPion  300 mg Oral Daily  . busPIRone  15 mg Oral BID  . Chlorhexidine Gluconate Cloth  6 each Topical Q0600  . clonazePAM  0.5 mg Oral QHS  . digoxin  0.125 mg Oral Daily  . docusate sodium  200 mg Oral Daily  . dutasteride  0.5 mg Oral Daily  . enoxaparin (LOVENOX) injection  30 mg Subcutaneous Q24H  . escitalopram  20 mg Oral Daily  . fluticasone  1 spray Each Nare Daily  . insulin aspart   0-15 Units Subcutaneous TID WC  . lamoTRIgine  100 mg Oral BID  . levothyroxine  150 mcg Oral QAC breakfast  . mupirocin ointment   Nasal BID  . pantoprazole  40 mg Oral Daily  . sodium chloride  3 mL Intravenous Q12H   Continuous Infusions: . sodium chloride 20 mL/hr at 02/12/15 0800  . sodium chloride 250 mL (02/12/15 0511)  . sodium chloride 20 mL (02/12/15 0012)  . DOPamine Stopped (02/13/15 1500)  . lactated ringers 20 mL/hr at 02/13/15 0102  . lactated ringers 20 mL/hr at 02/13/15 2000  . phenylephrine (NEO-SYNEPHRINE) Adult infusion Stopped (02/12/15 1200)   PRN Meds:.sodium chloride, albuterol, ondansetron (ZOFRAN) IV, oxyCODONE, sodium chloride, traMADol  Physical Exam: General appearance: alert, cooperative and no distress Heart: HR 60s under pacer Lungs: diminished breath sounds bibasilar Extremities: Mild LE edema Wound: Dressed and dry    Lab Results: CBC: Recent Labs  02/12/15 1645 02/13/15 0415  WBC 10.6* 10.1  HGB 10.0* 9.4*  HCT 30.4* 28.3*  PLT 104* 92*   BMET:  Recent Labs  02/13/15 0415 02/14/15 0445  NA 137 137  K 4.0 4.2  CL 102 102  CO2 29 29  GLUCOSE 127* 89  BUN 16 22*  CREATININE 1.63* 1.76*  CALCIUM 7.9* 7.9*    PT/INR:  Recent Labs  02/11/15 1415  LABPROT 18.2*  INR 1.50*   Radiology: No results found.   Assessment/Plan: S/P Procedure(s) (LRB): AORTIC VALVE REPLACEMENT (AVR) (N/A) CLIPPING OF ATRIAL APPENDAGE (N/A) TRANSESOPHAGEAL ECHOCARDIOGRAM (TEE) (N/A)  CV- Off all gtts. A-paced at 80, SR in 60s under pacer. Off beta blocker and dig resumed. Continue pacer for now, hopefully can d/c soon. BPs trending up, will restart Norvasc.   CKD stage 3- Cr up slightly this am. Continue to monitor.  Vol overload- UOP stable after IV Lasix yetserday. Could probably use further diuresis.    Thrombocytopenia- Plts down slightly, will continue to watch.  Pulm toilet/IS, ambulate in halls.  Hopefully can tx to stepdown  soon.  COLLINS,GINA H 02/14/2015 11:51 AM  In rate controlled a fib this afternoon HR ~80, BP 156/89 Will start diltiazem PO  Steven C. Dorris Fetch, MD [facility] [phone]

## 2015-02-15 LAB — CBC
HEMATOCRIT: 27.9 % — AB (ref 39.0–52.0)
HEMOGLOBIN: 9.1 g/dL — AB (ref 13.0–17.0)
MCH: 30.4 pg (ref 26.0–34.0)
MCHC: 32.6 g/dL (ref 30.0–36.0)
MCV: 93.3 fL (ref 78.0–100.0)
Platelets: 137 10*3/uL — ABNORMAL LOW (ref 150–400)
RBC: 2.99 MIL/uL — AB (ref 4.22–5.81)
RDW: 14.7 % (ref 11.5–15.5)
WBC: 6.3 10*3/uL (ref 4.0–10.5)

## 2015-02-15 LAB — BASIC METABOLIC PANEL
Anion gap: 7 (ref 5–15)
BUN: 18 mg/dL (ref 6–20)
CO2: 30 mmol/L (ref 22–32)
Calcium: 7.9 mg/dL — ABNORMAL LOW (ref 8.9–10.3)
Chloride: 102 mmol/L (ref 101–111)
Creatinine, Ser: 1.5 mg/dL — ABNORMAL HIGH (ref 0.61–1.24)
GFR calc Af Amer: 49 mL/min — ABNORMAL LOW (ref >60)
GFR calc non Af Amer: 42 mL/min — ABNORMAL LOW (ref >60)
GLUCOSE: 92 mg/dL (ref 65–99)
Potassium: 3.7 mmol/L (ref 3.5–5.1)
Sodium: 139 mmol/L (ref 135–145)

## 2015-02-15 LAB — GLUCOSE, CAPILLARY
Glucose-Capillary: 110 mg/dL — ABNORMAL HIGH (ref 65–99)
Glucose-Capillary: 103 mg/dL — ABNORMAL HIGH (ref 65–99)
Glucose-Capillary: 86 mg/dL (ref 65–99)
Glucose-Capillary: 91 mg/dL (ref 65–99)

## 2015-02-15 MED ORDER — SODIUM CHLORIDE 0.9 % IJ SOLN
10.0000 mL | Freq: Two times a day (BID) | INTRAMUSCULAR | Status: DC
  Administered 2015-02-15: 20 mL

## 2015-02-15 MED ORDER — OXYCODONE HCL 5 MG PO TABS: 5.0000 mg | ORAL_TABLET | ORAL | Status: DC | PRN

## 2015-02-15 MED ORDER — POTASSIUM CHLORIDE CRYS ER 20 MEQ PO TBCR
20.0000 meq | EXTENDED_RELEASE_TABLET | ORAL | Status: DC | PRN
  Filled 2015-02-15 (×2): qty 1

## 2015-02-15 MED ORDER — SODIUM CHLORIDE 0.9 % IJ SOLN
10.0000 mL | INTRAMUSCULAR | Status: DC | PRN
  Administered 2015-02-15: 3 mL
  Filled 2015-02-15: qty 40

## 2015-02-15 MED ORDER — ESZOPICLONE 1 MG PO TABS: 3.0000 mg | ORAL_TABLET | Freq: Every evening | ORAL | Status: DC | PRN

## 2015-02-15 MED ORDER — DILTIAZEM HCL ER 60 MG PO CP12
60.0000 mg | ORAL_CAPSULE | Freq: Two times a day (BID) | ORAL | Status: DC
  Administered 2015-02-15 – 2015-02-20 (×11): 60 mg via ORAL
  Filled 2015-02-15 (×12): qty 1

## 2015-02-15 MED ORDER — DILTIAZEM HCL ER 60 MG PO CP12
60.0000 mg | ORAL_CAPSULE | Freq: Two times a day (BID) | ORAL | Status: DC
  Filled 2015-02-15 (×2): qty 1

## 2015-02-15 NOTE — Progress Notes (Signed)
Anesthesiology Follow-up: POD #4 S/P AVR for severe aortic insufficiency  Doing well, ambulating around ICU without difficulty.  VS: T- 36.8 BP- 135/99 HR 95 (atrial fib) RR 18 O2 Sat 96% on 2L  Na- 139 K-3.7 BUN/Cr. 18/1.50  Glucose 151 H/H 9,1/27.9 Platelet count 127,000  Extubated 6 hours following surgery on 6/14. Stable post-op course.  Kipp Brood

## 2015-02-15 NOTE — Progress Notes (Signed)
4 Days Post-Op Procedure(s) (LRB): AORTIC VALVE REPLACEMENT (AVR) (N/A) CLIPPING OF ATRIAL APPENDAGE (N/A) TRANSESOPHAGEAL ECHOCARDIOGRAM (TEE) (N/A) Subjective: Getting stronger Ready for tx to stepdown Will get CIR eval Stable afib- not coumadin candidate-cont lovenox for now  Objective: Vital signs in last 24 hours: Temp:  [97.4 F (36.3 C)-98.3 F (36.8 C)] 97.4 F (36.3 C) (06/18 0900) Pulse Rate:  [52-119] 90 (06/18 1100) Cardiac Rhythm:  [-] Atrial fibrillation (06/18 0815) Resp:  [13-23] 22 (06/18 1100) BP: (102-168)/(58-125) 147/125 mmHg (06/18 1100) SpO2:  [92 %-100 %] 93 % (06/18 1100) Weight:  [258 lb 9.6 oz (117.3 kg)] 258 lb 9.6 oz (117.3 kg) (06/18 0500)  Hemodynamic parameters for last 24 hours:  stable  Intake/Output from previous day: 06/17 0701 - 06/18 0700 In: 620 [P.O.:620] Out: 2305 [Urine:2305] Intake/Output this shift:    Min edema  Lab Results:  Recent Labs  02/13/15 0415 02/15/15 0425  WBC 10.1 6.3  HGB 9.4* 9.1*  HCT 28.3* 27.9*  PLT 92* 137*   BMET:  Recent Labs  02/14/15 0445 02/15/15 0425  NA 137 139  K 4.2 3.7  CL 102 102  CO2 29 30  GLUCOSE 89 92  BUN 22* 18  CREATININE 1.76* 1.50*  CALCIUM 7.9* 7.9*    PT/INR: No results for input(s): LABPROT, INR in the last 72 hours. ABG    Component Value Date/Time   PHART 7.331* 02/12/2015 0334   HCO3 24.3* 02/12/2015 0334   TCO2 21 02/12/2015 1639   ACIDBASEDEF 2.0 02/12/2015 0334   O2SAT 93.0 02/12/2015 0334   CBG (last 3)   Recent Labs  02/14/15 1620 02/14/15 2231 02/15/15 0900  GLUCAP 75 121* 110*    Assessment/Plan: S/P Procedure(s) (LRB): AORTIC VALVE REPLACEMENT (AVR) (N/A) CLIPPING OF ATRIAL APPENDAGE (N/A) TRANSESOPHAGEAL ECHOCARDIOGRAM (TEE) (N/A) Mobilize Diuresis d/c tubes/lines Plan for transfer to step-down: see transfer orders   LOS: 4 days    [name] [name] 02/15/2015

## 2015-02-16 ENCOUNTER — Inpatient Hospital Stay (HOSPITAL_COMMUNITY): Payer: Medicare Other

## 2015-02-16 LAB — GLUCOSE, CAPILLARY
Glucose-Capillary: 105 mg/dL — ABNORMAL HIGH (ref 65–99)
Glucose-Capillary: 104 mg/dL — ABNORMAL HIGH (ref 65–99)
Glucose-Capillary: 114 mg/dL — ABNORMAL HIGH (ref 65–99)
Glucose-Capillary: 108 mg/dL — ABNORMAL HIGH (ref 65–99)

## 2015-02-16 LAB — BASIC METABOLIC PANEL
Anion gap: 7 (ref 5–15)
BUN: 18 mg/dL (ref 6–20)
CO2: 29 mmol/L (ref 22–32)
Calcium: 8.5 mg/dL — ABNORMAL LOW (ref 8.9–10.3)
Chloride: 104 mmol/L (ref 101–111)
Creatinine, Ser: 1.48 mg/dL — ABNORMAL HIGH (ref 0.61–1.24)
GFR calc Af Amer: 49 mL/min — ABNORMAL LOW (ref >60)
GFR calc non Af Amer: 43 mL/min — ABNORMAL LOW (ref >60)
Glucose, Bld: 102 mg/dL — ABNORMAL HIGH (ref 65–99)
Potassium: 4.4 mmol/L (ref 3.5–5.1)
Sodium: 140 mmol/L (ref 135–145)

## 2015-02-16 MED ORDER — ESZOPICLONE 1 MG PO TABS: 1.0000 mg | ORAL_TABLET | Freq: Every evening | ORAL | Status: DC | PRN

## 2015-02-16 NOTE — Progress Notes (Signed)
5 Days Post-Op Procedure(s) (LRB): AORTIC VALVE REPLACEMENT (AVR) (N/A) CLIPPING OF ATRIAL APPENDAGE (N/A) TRANSESOPHAGEAL ECHOCARDIOGRAM (TEE) (N/A) Subjective: Stable waiying for bed 2 west Controlled afib Now on lovenox- prob resume eliquis at DC PT rec is for SNF- will ask for CIR eval   Objective: Vital signs in last 24 hours: Temp:  [97.8 F (36.6 C)-98.6 F (37 C)] 97.8 F (36.6 C) (06/19 0728) Pulse Rate:  [32-90] 84 (06/19 0400) Cardiac Rhythm:  [-] Atrial fibrillation (06/19 0830) Resp:  [15-23] 23 (06/19 0400) BP: (126-155)/(75-125) 149/80 mmHg (06/19 0400) SpO2:  [64 %-100 %] 95 % (06/19 0400) Weight:  [257 lb 15 oz (117 kg)] 257 lb 15 oz (117 kg) (06/19 0300)  Hemodynamic parameters for last 24 hours:   stable Intake/Output from previous day: 06/18 0701 - 06/19 0700 In: 860 [P.O.:860] Out: 450 [Urine:450] Intake/Output this shift:    CXR clear Incisions clean  Lab Results:  Recent Labs  02/15/15 0425  WBC 6.3  HGB 9.1*  HCT 27.9*  PLT 137*   BMET:  Recent Labs  02/15/15 0425 02/16/15 0230  NA 139 140  K 3.7 4.4  CL 102 104  CO2 30 29  GLUCOSE 92 102*  BUN 18 18  CREATININE 1.50* 1.48*  CALCIUM 7.9* 8.5*    PT/INR: No results for input(s): LABPROT, INR in the last 72 hours. ABG    Component Value Date/Time   PHART 7.331* 02/12/2015 0334   HCO3 24.3* 02/12/2015 0334   TCO2 21 02/12/2015 1639   ACIDBASEDEF 2.0 02/12/2015 0334   O2SAT 93.0 02/12/2015 0334   CBG (last 3)   Recent Labs  02/15/15 1704 02/15/15 2206 02/16/15 0724  GLUCAP 86 91 105*    Assessment/Plan: S/P Procedure(s) (LRB): AORTIC VALVE REPLACEMENT (AVR) (N/A) CLIPPING OF ATRIAL APPENDAGE (N/A) TRANSESOPHAGEAL ECHOCARDIOGRAM (TEE) (N/A) Ok to go to telemetry bed 2 west   LOS: 5 days    Kathlee Nations Trigt III 02/16/2015

## 2015-02-17 LAB — GLUCOSE, CAPILLARY
Glucose-Capillary: 105 mg/dL — ABNORMAL HIGH (ref 65–99)
Glucose-Capillary: 98 mg/dL (ref 65–99)
Glucose-Capillary: 98 mg/dL (ref 65–99)
Glucose-Capillary: 100 mg/dL — ABNORMAL HIGH (ref 65–99)

## 2015-02-17 NOTE — Anesthesia Postprocedure Evaluation (Signed)
  Anesthesia Post-op Note  Patient: Water quality scientist  Procedure(s) Performed: Procedure(s): AORTIC VALVE REPLACEMENT (AVR) (N/A) CLIPPING OF ATRIAL APPENDAGE (N/A) TRANSESOPHAGEAL ECHOCARDIOGRAM (TEE) (N/A)  Patient Location: SICU  Anesthesia Type:General  Level of Consciousness: awake, alert  and oriented  Airway and Oxygen Therapy: Patient Spontanous Breathing and Patient connected to nasal cannula oxygen  Post-op Pain: mild  Post-op Assessment: Post-op Vital signs reviewed, Patient's Cardiovascular Status Stable, Respiratory Function Stable, Patent Airway and Pain level controlled              Post-op Vital Signs: stable  Last Vitals:  Filed Vitals:   02/17/15 1500  BP: 142/88  Pulse: 86  Temp:   Resp: 18    Complications: No apparent anesthesia complications

## 2015-02-17 NOTE — Progress Notes (Signed)
Pt much improved this evening, ate dinner sitting up in chair, color in face better and socializing with his son.  The patient stated he felt much better than earlier. Much improved compared to earlier in the shift.

## 2015-02-17 NOTE — Progress Notes (Signed)
Physical Therapy Treatment Patient Details Name: MARIANO DOSHI MRN: [id number] DOB: Mar 19, 1932 Today's Date: 02/17/2015    History of Present Illness Pt adm for AVR and clipping of atrial appendage. PMH - afib, OSA, brain injury with craniotomy in 1960, HTN. History of frequent falls per 09/2014 chart.    PT Comments    Pt more SOB, however SaO2 remained >93% on room air with activity. Walked a lesser distance compared to last visit. Reports he "slept all day yesterday" and hasn't felt as good as other days. No specific complaints. RN aware and continues to closely monitor.    Follow Up Recommendations  SNF     Equipment Recommendations   (to be determined)    Recommendations for Other Services       Precautions / Restrictions Precautions Precautions: Fall;Sternal    Mobility  Bed Mobility Overal bed mobility: Needs Assistance Bed Mobility: Rolling;Sidelying to Sit Rolling: Min assist Sidelying to sit: Mod assist       General bed mobility comments: vc for technique, assist due to inability to use UEs and weakness  Transfers Overall transfer level: Needs assistance Equipment used: Rolling walker (2 wheeled) Transfers: Sit to/from Stand Sit to Stand: Min assist         General transfer comment: vc for sternal precautions; Assist to bring hips up and for balance.  Ambulation/Gait Ambulation/Gait assistance: Min assist Ambulation Distance (Feet): 100 Feet Assistive device: Rolling walker (2 wheeled) Gait Pattern/deviations: Step-through pattern;Trunk flexed Gait velocity: almost too fast with RW getting too far ahead at times; pt becomes dyspneic Gait velocity interpretation: at or above normal speed for age/gender General Gait Details: good cadence and step length; vc for proper use of RW   Stairs            Wheelchair Mobility    Modified Rankin (Stroke Patients Only)       Balance     Sitting balance-Leahy Scale: Fair       Standing  balance-Leahy Scale: Poor                      Cognition Arousal/Alertness: Awake/alert Behavior During Therapy: Impulsive Overall Cognitive Status: No family/caregiver present to determine baseline cognitive functioning Area of Impairment: Safety/judgement;Problem solving;Attention   Current Attention Level: Sustained Memory:  (able to grossly recall precautions)   Safety/Judgement: Decreased awareness of safety;Decreased awareness of deficits   Problem Solving: Requires verbal cues General Comments: required vc for sequencing with transfers to maintain sternal precautions    Exercises      General Comments        Pertinent Vitals/Pain Pain Assessment: Faces Faces Pain Scale: Hurts little more Pain Location: LLE, lateral to shin Pain Intervention(s): Limited activity within patient's tolerance;Monitored during session;Repositioned    Home Living                      Prior Function            PT Goals (current goals can now be found in the care plan section) Acute Rehab PT Goals Patient Stated Goal: return home Time For Goal Achievement: 02/26/15 Progress towards PT goals: Progressing toward goals    Frequency  Min 3X/week    PT Plan Current plan remains appropriate    Co-evaluation             End of Session Equipment Utilized During Treatment: Gait belt Activity Tolerance: Patient limited by fatigue Patient left: with call bell/phone within  reach;in chair;with chair alarm set     Time: [phone] PT Time Calculation (min) (ACUTE ONLY): 22 min  Charges:  $Gait Training: 8-22 mins                    G Codes:      Nilda Keathley 23-Feb-2015, 4:36 PM Pager [phone]

## 2015-02-17 NOTE — Progress Notes (Signed)
CARDIAC REHAB PHASE I   PRE:  Rate/Rhythm: 80 afib PVCs  BP:  Supine: 172/94  Sitting:   Standing:    SaO2: 97%RA  MODE:  Ambulation: 260 ft   POST:  Rate/Rhythm: 103 afib  BP:  Supine:   Sitting: 158/62  Standing:    SaO2: 95%RA 0805-0852 Pt walked 260 ft on RA with gait belt use , rolling walker and asst x 2. Had to sit at 130 ft to rest. Had some right sided weakness with leg which he stated he thinks he had small stroke before surgery and weakness comes and goes. We put on shoes which seemed to help some with walking but he kept feet a little close. Pt stated difficult to keep feet apart because walker too narrow. Pt stated he felt numb all over as he said he got sleeping aid. To recliner with call bell and chair alarm. Set up breakfast. Will keep as asst x 2.   Luetta Nutting, RN BSN  02/17/2015 8:48 AM

## 2015-02-17 NOTE — Progress Notes (Signed)
Utilization review completed.  

## 2015-02-17 NOTE — Progress Notes (Signed)
Paged the cardiac surgery PA because I am concerned about the patient's complaints.  Patient states he just feels bad today. After the walk with PT this morning he has been sleeping. Very non-de script complaints but does state he feels weak and qweezy to his stomach. Offered Zofran pt declined at this time.  Had charge nurse Belenda Cruise RN come in to assess his condition with me for a second pair of eyes.  Pt states he feels weak all over and tierd.  He states he started feeling bad last evening although he slept through the night with no issues. O2 sat 96% R/A although pt appears pale.  He does have an expiatory wheeze.  B/P 137/ 90  HR 84 RR 20.  Pt states he is to weak to take his pills and he will try again in one hour.  Neuro check was negative for stroke.  Will continue to monitor.

## 2015-02-17 NOTE — Progress Notes (Addendum)
Pt stated not feeling well this AM, pt appears pale, however VSS, pt not wanting to take pills this AM stating stomach is queezy, fatigued, pt with equal grips, Neuro intact. Pt states he feels this way after walking this AM, stated it just came on suddenly   (see Previous RN Note), PA Erin Barrett on floor and in to see patient, she stated Ok to pull wires in AM, no other orders received. Will continue to monitor patient. Adley Castello, Randall An RN

## 2015-02-17 NOTE — Progress Notes (Signed)
Physical medicine rehabilitation consult requested chart reviewed. Patient currently ambulating 400 feet rolling walker minimal assistance. Recommendations have been made for skilled nursing facility. Spouse is unable to provide any physical assistance. This time will hold on formal rehabilitation consult with recommendations of skilled nursing facility and case management follow-up.

## 2015-02-17 NOTE — Progress Notes (Signed)
PT Cancellation Note  Patient Details Name: BERNON SURTI MRN: [id number] DOB: 1931-09-15   Cancelled Treatment:    Reason Eval/Treat Not Completed: Medical issues which prohibited therapy. See RN entry re: pt's reports of weakness, fatigue, and stomach issues. Will reattempt as schedule permits   Alixandrea Milleson 02/17/2015, 11:26 AM Pager [phone]

## 2015-02-17 NOTE — Progress Notes (Addendum)
      [address]       [place] [address]             [phone]        6 Days Post-Op Procedure(s) (LRB): AORTIC VALVE REPLACEMENT (AVR) (N/A) CLIPPING OF ATRIAL APPENDAGE (N/A) TRANSESOPHAGEAL ECHOCARDIOGRAM (TEE) (N/A)  Subjective: Patient sleeping-Gently awakened. He has no complaints this am.  Objective: Vital signs in last 24 hours: Temp:  [97.5 F (36.4 C)-98.4 F (36.9 C)] 98.4 F (36.9 C) (06/20 0608) Pulse Rate:  [73-81] 81 (06/20 0608) Cardiac Rhythm:  [-] Atrial fibrillation (06/19 2015) Resp:  [16-20] 18 (06/20 0608) BP: (112-190)/(87-114) 130/100 mmHg (06/20 0608) SpO2:  [95 %-97 %] 96 % (06/20 0608) Weight:  [247 lb 8 oz (112.265 kg)] 247 lb 8 oz (112.265 kg) (06/20 3005)  Pre op weight 116 kg Current Weight  02/17/15 247 lb 8 oz (112.265 kg)      Intake/Output from previous day: 06/19 0701 - 06/20 0700 In: 440 [P.O.:440] Out: 775 [Urine:775]   Physical Exam:  Cardiovascular: RRR, no murmurs Pulmonary: Clear to auscultation bilaterally; no rales, wheezes, or rhonchi. Abdomen: Soft, non tender, bowel sounds present. Extremities: Mild bilateral lower extremity edema. Wounds: Clean and dry.  No erythema or signs of infection.  Lab Results: CBC: Recent Labs  02/15/15 0425  WBC 6.3  HGB 9.1*  HCT 27.9*  PLT 137*   BMET:  Recent Labs  02/15/15 0425 02/16/15 0230  NA 139 140  K 3.7 4.4  CL 102 104  CO2 30 29  GLUCOSE 92 102*  BUN 18 18  CREATININE 1.50* 1.48*  CALCIUM 7.9* 8.5*    PT/INR:  Lab Results  Component Value Date   INR 1.50* 02/11/2015   INR 1.23 02/07/2015   INR 1.17 06/20/2014   ABG:  INR: Will add last result for INR, ABG once components are confirmed Will add last 4 CBG results once components are confirmed  Assessment/Plan:  1. CV - Previous hypertension. A fib with CVR-HR in the 80's. On Cardizem SR 60 mg bid and Digoxin 0.125 mg daily. He was on Eliquis prior to surgery. Will discuss  with Dr. Tyrone Sage when should resume. 2.  Pulmonary - History of COPD. On room air. 3.  Acute blood loss anemia - Last H and H 9.1 and 27.9 4. History of CKD. Creatinine yesterday stable at 1.48. Admitting creatinine was 2.03 5. Remove EPW 6. CBGs 114/108/105. Pre op HGA1C 6.2. He is likely pre diabetic. Stop accu checks and SS PRN. He will need follow up with his medical doctor after discharge. 7. Hopefully to SNF soon  [name],[name] MPA-C 02/17/2015,7:42 AM  I have seen and examined [name] [name] and agree with the above assessment  and plan.  [name] MD Beeper [phone] Office [phone] 02/17/2015 4:58 PM

## 2015-02-17 NOTE — Op Note (Signed)
[name] [name], GROPP              ACCOUNT NO.:  [id number]  MEDICAL RECORD NO.:  [id number]  LOCATION:  [id number]                        FACILITY:  [facility]  PHYSICIAN:  Sheliah Plane, MD    DATE OF BIRTH:  01/09/1932  DATE OF PROCEDURE:  02/11/2015 DATE OF DISCHARGE:                              OPERATIVE REPORT   PREOPERATIVE DIAGNOSES:  Severe aortic insufficiency and coronary artery disease of the right coronary artery.  POSTOPERATIVE DIAGNOSES:  Severe aortic insufficiency and coronary artery disease of the right coronary artery.  SURGICAL PROCEDURE:  Aortic valve replacement with pericardial tissue valve, [employer] model 3300TFX 27 mm, serial #[id number] and attempt harvest of saphenous vein.  No bypass done.  Placement of 35 mm AtriCure AtriClip.  SURGEON:  Sheliah Plane, MD  FIRST ASSISTANT:  Pauline Good, PA.  BRIEF HISTORY:  The patient is an 81 year old male who for the past 5 months has been followed for severe aortic insufficiency.  He had been followed for many years at [facility] with aortic insufficiency and renal insufficiency.  Presented there in early January, underwent cardiac catheterization and repeat echocardiogram.  At that time, any surgical intervention was declined because of his weak status, and he was discharged to a nursing home.  He presented in February to see me for consultation about his treatment.  With extensive physical therapy, the patient had had gradually improved to the point where he had become independent.  Cardiac cath showed a 60% to 70% distal right coronary lesion in his LAD and circumflex system with luminal irregularities, but no high-grade stenosis.  He had severe aortic insufficiency. Consideration for TAVR was discussed with Dr. Excell Seltzer, but it was not felt for technical reasons he was a candidate for this.  After extensive preoperative evaluation and treatment, the patient was felt to be reasonable candidate for aortic  valve replacement.  He has chronic atrial fibrillation and had been placed on Eliquis.  Risks and options were discussed with the patient and son in detail especially because of his age.  They understood these risks and were willing to proceed.  DESCRIPTION OF PROCEDURE:  S[place] and arterial line monitors had been placed.  The patient underwent general endotracheal anesthesia without incident.  Dr. Arita Miss placed a TEE probe confirming trileaflet aortic valve and severe aortic insufficiency.  There was no mitral insufficiency.  Overall, he had evidence of moderate LV dilatation, but ejection fraction in the 40% to 50% range.  The skin of the chest and legs were prepped with Betadine and draped in usual sterile manner. Appropriate time-out was performed.  We had planned to place a vein graft to the distal right coronary artery though the patient had no known ischemic symptoms from this lesion; however, after attempt at endo vein harvesting, a segment of vein was harvested from the right thigh and was nothing, but a quarter with no lumen.  In a similar fashion, we looked both in the left thigh and at both ankles for further sites of vein, each of the veins that were identified were totally thrombosed and unsuitable for bypass.  At this point, we felt the lesion was not tight enough to warrant use of a mammary  artery and proceeded with aortic valve.  Median sternotomy was performed, pericardium was opened.  The patient was systemically heparinized.  Ascending aorta was cannulated. The right atrium was cannulated and aortic root vent cardioplegia needle was introduced into the ascending aorta.  The patient was placed on cardiopulmonary bypass 2.4 L/min/m2.  The right superior pulmonary vein vent was placed.  Retrograde cardioplegia catheter was placed.  The patient's body temperature was cooled to 32 degrees.  Aortic crossclamp was applied.  As the heart was beating, we proceeded with  antegrade cardioplegia until he fibrillated and then continued with retrograde cardioplegia.  Because of the patient's history of atrial fibrillation, we decided to place an AtriCure AtriClip on the left atrial appendage. The appendage was actually relatively small based and a 35 AtriCure AtriClip was selected and placed at the base of the atrial appendage. We then proceeded with a transverse aortotomy, which gave good exposure of a trileaflet, deteriorated aortic valve with some calcification and rolled edges.  The aortic root itself was approximately 4 cm.  With the patient's age of 69 years, I did not feel warranted to do a formal aortic root replacement, excised the leaflets, sized the annulus for a 27 pericardial tissue valve.  #2 Tycron pledgeted sutures were placed circumferentially with the pledgets on the ventricular surface.  This was used to secure a pericardial tissue valve [employer] model 3300TFX 27 mm, serial [id number].  The valve seated well.  Care was taken to remove any loose calcific debris.  The aortotomy was then closed with horizontal mattress 4-0 Prolene suture, horizontal mattress first and a second layer of running Prolene.  Prior to complete closure of the aortotomy, the heart was allowed to passively fill and de-air.  The aortic cross-clamp was removed with a total crossclamp time of 77 minutes.  A 16-gauge needle was introduced into the left ventricular apex to further de-air the heart and the aortic root vent cardioplegia needle was also used to de-air the heart.  The patient spontaneously converted to a sinus rhythm.  Atrial and ventricular pacing wires were applied as we filled the heart.  TEE showed good valve function.  Right superior pulmonary vein vent was removed.  The retrograde cardioplegia catheter was removed.  With the patient's body temperature rewarmed to 37 degrees, he was then ventilated and weaned from cardiopulmonary bypass without  difficulty on low-dose milrinone and low-dose dopamine. He remained hemodynamically stable.  He was decannulated in usual fashion.  Protamine sulfate was administered.  With the operative field hemostatic, pericardium was loosely reapproximated.  Sternum was closed with #6 stainless steel wire.  Fascia was closed with interrupted 0- Vicryl, running 3-0 Monocryl subcutaneous tissue, and 4-0 subcuticular stitch in skin edges.  Dry dressings were applied.  Sponge and needle counts were reported as correct at the completion of procedure.  The patient tolerated the procedure without obvious complications and was transferred to Surgical Intensive Care Unit for further postoperative care.  Total pump time was 108 minutes.     Sheliah Plane, MD     EG/MEDQ  D:  02/17/2015  T:  02/17/2015  Job:  [id number]

## 2015-02-18 ENCOUNTER — Inpatient Hospital Stay (HOSPITAL_COMMUNITY): Payer: Medicare Other

## 2015-02-18 DIAGNOSIS — Z954 Presence of other heart-valve replacement: Secondary | ICD-10-CM

## 2015-02-18 DIAGNOSIS — R5381 Other malaise: Secondary | ICD-10-CM

## 2015-02-18 LAB — BASIC METABOLIC PANEL
ANION GAP: 8 (ref 5–15)
BUN: 15 mg/dL (ref 6–20)
CHLORIDE: 103 mmol/L (ref 101–111)
CO2: 27 mmol/L (ref 22–32)
Calcium: 8.4 mg/dL — ABNORMAL LOW (ref 8.9–10.3)
Creatinine, Ser: 1.54 mg/dL — ABNORMAL HIGH (ref 0.61–1.24)
GFR, EST AFRICAN AMERICAN: 47 mL/min — AB (ref >60)
GFR, EST NON AFRICAN AMERICAN: 41 mL/min — AB (ref >60)
Glucose, Bld: 157 mg/dL — ABNORMAL HIGH (ref 65–99)
POTASSIUM: 3.5 mmol/L (ref 3.5–5.1)
SODIUM: 138 mmol/L (ref 135–145)

## 2015-02-18 LAB — GLUCOSE, CAPILLARY
Glucose-Capillary: 87 mg/dL (ref 65–99)
Glucose-Capillary: 103 mg/dL — ABNORMAL HIGH (ref 65–99)
Glucose-Capillary: 90 mg/dL (ref 65–99)
Glucose-Capillary: 100 mg/dL — ABNORMAL HIGH (ref 65–99)

## 2015-02-18 LAB — CBC
HCT: 29.3 % — ABNORMAL LOW (ref 39.0–52.0)
Hemoglobin: 9.4 g/dL — ABNORMAL LOW (ref 13.0–17.0)
MCH: 30.3 pg (ref 26.0–34.0)
MCHC: 32.1 g/dL (ref 30.0–36.0)
MCV: 94.5 fL (ref 78.0–100.0)
PLATELETS: 238 10*3/uL (ref 150–400)
RBC: 3.1 MIL/uL — AB (ref 4.22–5.81)
RDW: 15.8 % — ABNORMAL HIGH (ref 11.5–15.5)
WBC: 6.1 10*3/uL (ref 4.0–10.5)

## 2015-02-18 LAB — LIPASE, BLOOD: LIPASE: 82 U/L — AB (ref 22–51)

## 2015-02-18 MED ORDER — POTASSIUM CHLORIDE ER 10 MEQ PO TBCR
40.0000 meq | EXTENDED_RELEASE_TABLET | Freq: Once | ORAL | Status: AC
  Administered 2015-02-18: 40 meq via ORAL
  Filled 2015-02-18: qty 4

## 2015-02-18 MED ORDER — METOCLOPRAMIDE HCL 5 MG/ML IJ SOLN
10.0000 mg | Freq: Four times a day (QID) | INTRAMUSCULAR | Status: DC
  Administered 2015-02-18: 10 mg via INTRAVENOUS
  Filled 2015-02-18 (×4): qty 2

## 2015-02-18 NOTE — Progress Notes (Signed)
Nurse just informed me that patient's heart rate has decreased into the 40's. He has been in a fib with a controlled ventricular rate. I have stopped his Digoxin. He is on Cardizem SR 60 mg bid. We may have to adjust this as well but will monitor for now. I am awaiting this am's labs.

## 2015-02-18 NOTE — Care Management Note (Signed)
Case Management Note Donn Pierini RN, BSN Unit 2W-Case Manager [phone]  Patient Details  Name: [name] [name] MRN: [id number] Date of Birth: 08/25/1932  Subjective/Objective:     Pt admitted s/p AVR               Action/Plan: PTA pt lived at home with spouse- uses [facility]- plan is to d/c to SNF- CSW following for d/c needs  Expected Discharge Date:                  Expected Discharge Plan:  Skilled Nursing Facility  In-House Referral:  Clinical Social Work  Discharge planning Services  CM Consult  Post Acute Care Choice:    Choice offered to:     DME Arranged:    DME Agency:     HH Arranged:    HH Agency:     Status of Service:  In process, will continue to follow  Medicare Important Message Given:  Yes Date Medicare IM Given:  02/17/15 Medicare IM give by:  Donn Pierini RN, BSN  Date Additional Medicare IM Given:    Additional Medicare Important Message give by:     If discussed at Long Length of Stay Meetings, dates discussed:  02/18/15  Additional Comments:  Darrold Span, RN 02/18/2015, 10:37 AM

## 2015-02-18 NOTE — Progress Notes (Signed)
CARDIAC REHAB PHASE I   PRE:  Rate/Rhythm: 87 afib with occ PVC    BP: sitting 128/80    SaO2: 96 RA  MODE:  Ambulation: 210 ft   POST:  Rate/Rhythm: 103 afib with frequent PVCs    BP: sitting 157/69     SaO2: 96 RA  Pt feeling better this pm, willing to walk. Increased PVCs with activity/walking. Had to rest after 100 ft due to weak legs. Able to walk back to room. Almost 50% PVCs on monitor, rate stable. Pt thankful for walk. [id number]   Elissa Lovett [place] CES, ACSM 02/18/2015 2:08 PM

## 2015-02-18 NOTE — Consult Note (Signed)
Physical Medicine and Rehabilitation Consult Reason for Consult: Debilitation after aortic valve replacement Referring Physician: Dr. Tyrone Sage   HPI: [name] is a 81 y.o. right handed male with history of atrial fibrillation maintained onEliquis, chronic renal insufficiency with baseline creatinine 1.69, Traumatic brain injury with craniotomy 1960. Presented 02/11/2015 with history of severe aortic insufficiency and has been followed at [facility] in the past. Patient lives with his wife using a walker and cane at times prior to admission with history of frequent falls. Patient with recent nursing home placement February 2016 after right proximal fibula fracture after a fall. Evaluation by cardiothoracic surgery for aortic valve insufficiency and underwent aortic valve replacement 02/17/2015 per Dr. Tyrone Sage. Hospital course pain management. Lovenox for DVT prophylaxis. Presently maintained on aspirin therapy after aortic valve replacement question plan to resume Eliquis. Contact precautions for MRSA PCR screen positive. Physical therapy postoperatively is pending. M.D. has requested physical medicine rehabilitation consult.   Review of Systems  Constitutional: Negative for fever and chills.  HENT: Positive for hearing loss.   Respiratory:       Shortness of breath on exertion  Cardiovascular: Positive for palpitations and leg swelling.  Gastrointestinal: Positive for constipation.       GERD  Genitourinary:       Urinary incontinence  Musculoskeletal: Positive for myalgias, joint pain and falls.  Skin: Negative for rash.  Neurological: Positive for weakness. Negative for headaches.       Syncope  Psychiatric/Behavioral: Positive for depression and memory loss.       Anxiety   Past Medical History  Diagnosis Date  . Atrial fibrillation   . Hypothyroidism   . Plantar fasciitis   . Seasonal allergies   . Decreased testosterone level   . Varicose veins   .  Hypertension   . Hypercholesteremia   . Aortic valve insufficiency   . Depression   . Heart murmur   . DVT (deep venous thrombosis) ~ 2013    "BLE"  . OSA (obstructive sleep apnea)     "suppose to wear mask; I throw it off in my sleep" (10/15/2014)  . COPD (chronic obstructive pulmonary disease)   . Emphysema of lung   . History of blood transfusion 1960    "lots; related to an accident"  . Arthritis     "wee bit; knees, elbows" (10/15/2014)  . Anxiety   . Falls frequently     > 20 times in the last year/notes 10/15/2014  . Syncope and collapse "several times"  . Complication of anesthesia     unsure of complications but pt. states there were complications  . Dysrhythmia     A. Fib  . Coronary artery disease   . Shortness of breath dyspnea   . Chronic kidney disease     "down to ~ 1/2 of their regular use; I see kidney dr. in [place]" (10/15/2014)  . Urine frequency   . Urine incontinence   . Enlarged prostate   . GERD (gastroesophageal reflux disease)   . Difficult intubation     difficult intubation 12/03/09 and 03/27/10 ([facility]); glidescope used 03/27/10   Past Surgical History  Procedure Laterality Date  . Abdominal aortic aneurysm repair  01/2006; 03/10/2006    Hattie Perch 01/12/2011  . Tonsillectomy    . Hernia repair    . Laparoscopic incisional / umbilical / ventral hernia repair  02/2010    VHR w/mesh/notes 03/28/2010  . Laparoscopic cholecystectomy  08/2009  w/IOC/notes 09/18/2009  . Ercp  11/2009    Hattie Perch 12/05/2009  . Gall stone    . Cardiac catheterization  1990's X 1; 09/2014    no stents  . Nose surgery    . Brain surgery  1960 X 4    "S/P got my skull busted"; pt. states removed internal carotid artery  . Aortic valve replacement N/A 02/11/2015    Procedure: AORTIC VALVE REPLACEMENT (AVR);  Surgeon: [name], MD;  Location: [facility] OR;  Service: Open Heart Surgery;  Laterality: N/A;  . Clipping of atrial appendage N/A 02/11/2015    Procedure: CLIPPING OF ATRIAL  APPENDAGE;  Surgeon: [name], MD;  Location: [facility] OR;  Service: Open Heart Surgery;  Laterality: N/A;  . Tee without cardioversion N/A 02/11/2015    Procedure: TRANSESOPHAGEAL ECHOCARDIOGRAM (TEE);  Surgeon: [name], MD;  Location: [facility] OR;  Service: Open Heart Surgery;  Laterality: N/A;   Family History  Problem Relation Age of Onset  . Heart disease Father   . Rheum arthritis Mother   . Rheum arthritis Father   . Cancer Sister   . Stroke Sister    Social History:  reports that he quit smoking about 40 years ago. His smoking use included Cigarettes. He has a 44 pack-year smoking history. He has never used smokeless tobacco. He reports that he drinks alcohol. He reports that he does not use illicit drugs. Allergies: No Known Allergies Medications Prior to Admission  Medication Sig Dispense Refill  . amLODipine (NORVASC) 5 MG tablet Take 5 mg by mouth daily.    Marland Kitchen apixaban (ELIQUIS) 2.5 MG TABS tablet Take 1 tablet (2.5 mg total) by mouth 2 (two) times daily. 60 tablet 3  . buPROPion (WELLBUTRIN XL) 300 MG 24 hr tablet Take 300 mg by mouth daily.    . busPIRone (BUSPAR) 15 MG tablet Take 15 mg by mouth 2 (two) times daily.    . clonazePAM (KLONOPIN) 0.5 MG tablet Take 1 tablet (0.5 mg total) by mouth at bedtime as needed for anxiety. 30 tablet 0  . digoxin (LANOXIN) 0.125 MG tablet Take 0.125 mg by mouth daily.    Marland Kitchen diltiazem (TIAZAC) 120 MG 24 hr capsule Take 120 mg by mouth daily.    Marland Kitchen dutasteride (AVODART) 0.5 MG capsule Take 0.5 mg by mouth daily.    Marland Kitchen escitalopram (LEXAPRO) 20 MG tablet Take 20 mg by mouth daily.    . Eszopiclone (ESZOPICLONE) 3 MG TABS Take 6 mg by mouth at bedtime. Take immediately before bedtime    . folic acid (FOLVITE) 1 MG tablet Take 1 mg by mouth daily.    Marland Kitchen lamoTRIgine (LAMICTAL) 100 MG tablet Take 100 mg by mouth 2 (two) times daily. Breakfast and dinner    . levothyroxine (SYNTHROID, LEVOTHROID) 150 MCG tablet Take 150 mcg by mouth daily  before breakfast.    . lisinopril (PRINIVIL,ZESTRIL) 40 MG tablet Take 40 mg by mouth at bedtime.  3  . Melatonin 3 MG TABS Take 1 tablet by mouth at bedtime.    Marland Kitchen acetaminophen (TYLENOL) 325 MG tablet Take 2 tablets (650 mg total) by mouth every 6 (six) hours as needed for mild pain (or Fever >/= 101).    Marland Kitchen albuterol (PROVENTIL HFA;VENTOLIN HFA) 108 (90 BASE) MCG/ACT inhaler Inhale 2 puffs into the lungs every 6 (six) hours as needed for wheezing or shortness of breath.    Marland Kitchen atorvastatin (LIPITOR) 10 MG tablet Take 10 mg by mouth daily.  0  . calcium carbonate (TUMS - DOSED IN MG ELEMENTAL CALCIUM) 500 MG chewable tablet Chew 1 tablet by mouth as needed for indigestion or heartburn.    . fluticasone (FLONASE) 50 MCG/ACT nasal spray Place 1 spray into both nostrils daily. (Patient taking differently: Place 1 spray into both nostrils daily as needed for allergies. ) 9.9 g 2    Home: Home Living Family/patient expects to be discharged to:: Skilled nursing facility Living Arrangements: Spouse/significant other Available Help at Discharge: Family (wife unable to physically assist pt) Type of Home: House Home Access: Stairs to enter Secretary/administrator of Steps: 4 Entrance Stairs-Rails: None Home Layout: Multi-level Alternate Level Stairs-Number of Steps: 13 Alternate Level Stairs-Rails: Left Home Equipment: Environmental consultant - 4 wheels, Walker - 2 wheels, [place] - quad, Stiles - single point  Functional History: Prior Function Level of Independence: Independent Comments: Pt reports he was amb without assistive device. History of frequent falls. Functional Status:  Mobility: Bed Mobility Overal bed mobility: Needs Assistance Bed Mobility: Rolling, Sidelying to Sit Rolling: Min assist Sidelying to sit: Mod assist Supine to sit: +2 for physical assistance, Max assist Sit to sidelying: Min assist, +2 for physical assistance, +2 for safety/equipment General bed mobility comments: vc for technique,  assist due to inability to use UEs and weakness Transfers Overall transfer level: Needs assistance Equipment used: Rolling walker (2 wheeled) Transfers: Sit to/from Stand Sit to Stand: Min assist Stand pivot transfers: +2 physical assistance, Mod assist General transfer comment: vc for sternal precautions; Assist to bring hips up and for balance. Ambulation/Gait Ambulation/Gait assistance: Min assist Ambulation Distance (Feet): 100 Feet Assistive device: Rolling walker (2 wheeled) General Gait Details: good cadence and step length; vc for proper use of RW Gait Pattern/deviations: Step-through pattern, Trunk flexed Gait velocity: almost too fast with RW getting too far ahead at times; pt becomes dyspneic Gait velocity interpretation: at or above normal speed for age/gender    ADL:    Cognition: Cognition Overall Cognitive Status: No family/caregiver present to determine baseline cognitive functioning Orientation Level: Oriented X4 Cognition Arousal/Alertness: Awake/alert Behavior During Therapy: Impulsive Overall Cognitive Status: No family/caregiver present to determine baseline cognitive functioning Area of Impairment: Safety/judgement, Problem solving, Attention Current Attention Level: Sustained Memory:  (able to grossly recall precautions) Safety/Judgement: Decreased awareness of safety, Decreased awareness of deficits Problem Solving: Requires verbal cues General Comments: required vc for sequencing with transfers to maintain sternal precautions  Blood pressure 148/104, pulse 86, temperature 98.2 F (36.8 C), temperature source Oral, resp. rate 18, height 6' (1.829 m), weight 112.265 kg (247 lb 8 oz), SpO2 94 %. Physical Exam  HENT:  Head: Normocephalic.  Eyes: EOM are normal.  Neck: Normal range of motion. Neck supple. No thyromegaly present.  Cardiovascular:  Cardiac rate controlled  Respiratory: Effort normal and breath sounds normal. No respiratory distress.    GI: Soft. Bowel sounds are normal. He exhibits no distension.  Neurological: He is alert.  Oriented to person, place and age. Fair awareness of deficits. Follows simple commands. 4/5 ue's and 3-4/5 prox to distal in LE's.   Skin:  Wounds clean, well approximated.  Psychiatric: He has a normal mood and affect. His behavior is normal.    Results for orders placed or performed during the hospital encounter of 02/11/15 (from the past 24 hour(s))  Glucose, capillary     Status: Abnormal   Collection Time: 02/17/15  7:03 AM  Result Value Ref Range   Glucose-Capillary 105 (H) 65 - 99 mg/dL  Glucose, capillary     Status: None   Collection Time: 02/17/15 12:13 PM  Result Value Ref Range   Glucose-Capillary 98 65 - 99 mg/dL  Glucose, capillary     Status: None   Collection Time: 02/17/15  4:28 PM  Result Value Ref Range   Glucose-Capillary 98 65 - 99 mg/dL   Comment 1 Notify RN   Glucose, capillary     Status: Abnormal   Collection Time: 02/17/15  9:53 PM  Result Value Ref Range   Glucose-Capillary 100 (H) 65 - 99 mg/dL   Dg Chest 2 View  0/99/8338   CLINICAL DATA:  Short of breath  EXAM: CHEST  2 VIEW  COMPARISON:  02/13/2015  FINDINGS: Central venous catheter tip has been removed. Improved lung volumes with decrease in bibasilar atelectasis. Small bilateral effusions remain. No edema. Prior heart surgery.  IMPRESSION: Improved aeration of the lungs. Decrease in bibasilar atelectasis. Small bilateral effusions remain.   Electronically Signed   By: Marlan Palau M.D.   On: 02/16/2015 08:23    Assessment/Plan: Diagnosis: debility related to AS and subsequent AVR 1. Does the need for close, 24 hr/day medical supervision in concert with the patient's rehab needs make it unreasonable for this patient to be served in a less intensive setting? Yes 2. Co-Morbidities requiring supervision/potential complications: CAD, afib,  3. Due to bladder management, bowel management, safety, skin/wound  care, disease management, medication administration, pain management and patient education, does the patient require 24 hr/day rehab nursing? Yes 4. Does the patient require coordinated care of a physician, rehab nurse, PT (1-2 hrs/day, 5 days/week) and OT (1-2 hrs/day, 5 days/week) to address physical and functional deficits in the context of the above medical diagnosis(es)? Yes Addressing deficits in the following areas: balance, endurance, locomotion, strength, transferring, bowel/bladder control, bathing, dressing, feeding, grooming, toileting and psychosocial support 5. Can the patient actively participate in an intensive therapy program of at least 3 hrs of therapy per day at least 5 days per week? Yes 6. The potential for patient to make measurable gains while on inpatient rehab is excellent 7. Anticipated functional outcomes upon discharge from inpatient rehab are modified independent  with PT, modified independent with OT, n/a with SLP. 8. Estimated rehab length of stay to reach the above functional goals is: 7 days 9. Does the patient have adequate social supports and living environment to accommodate these discharge functional goals? Yes 10. Anticipated D/C setting: Home 11. Anticipated post D/C treatments: HH therapy 12. Overall Rehab/Functional Prognosis: excellent  RECOMMENDATIONS: This patient's condition is appropriate for continued rehabilitative care in the following setting: CIR Patient has agreed to participate in recommended program. Yes Note that insurance prior authorization may be required for reimbursement for recommended care.  Comment: Rehab Admissions Coordinator to follow up.  Thanks,  Ranelle Oyster, MD, [place] Dom     02/18/2015

## 2015-02-18 NOTE — Progress Notes (Signed)
Dr. Riley Kill saw pt and pt is a candidate for an inpt rehab admission, BUT, inpt rehab bed likely unavailable over next 2 days. Noted pt has accepted bed at [name]. [phone]

## 2015-02-18 NOTE — Progress Notes (Signed)
02/18/2015 1030 Pt not feeling well, states he gets dizzy when up.  Orthostatics done, pt is orthostatic.  Nurse aware and is going to notify MD. Kathryne Hitch

## 2015-02-18 NOTE — Progress Notes (Signed)
CSW provided bed offers to patient- patient is agreeable to [facility]- facility is able to admit when pt is medically stable.  CSW will continue to follow.  Merlyn Lot, LCSWA Clinical Social Worker [phone]

## 2015-02-18 NOTE — Progress Notes (Addendum)
      [address]       [place] [address]             [phone]        7 Days Post-Op Procedure(s) (LRB): AORTIC VALVE REPLACEMENT (AVR) (N/A) CLIPPING OF ATRIAL APPENDAGE (N/A) TRANSESOPHAGEAL ECHOCARDIOGRAM (TEE) (N/A)  Subjective: Patient does not feel well this am. He has nausea and feels as though he may vomit.  Objective: Vital signs in last 24 hours: Temp:  [98.2 F (36.8 C)] 98.2 F (36.8 C) (06/21 0536) Pulse Rate:  [86] 86 (06/20 1500) Cardiac Rhythm:  [-] Atrial fibrillation (06/20 2000) Resp:  [18] 18 (06/21 0536) BP: (142-148)/(88-104) 148/104 mmHg (06/21 0536) SpO2:  [94 %] 94 % (06/21 0536)  Pre op weight 116 kg Current Weight  02/17/15 247 lb 8 oz (112.265 kg)      Intake/Output from previous day: 06/20 0701 - 06/21 0700 In: 360 [P.O.:360] Out: 400 [Urine:400]   Physical Exam:  Cardiovascular: IRRR Pulmonary: Clear to auscultation bilaterally; no rales, wheezes, or rhonchi. Abdomen: Soft, non tender, hypoactive bowel sounds present. Extremities: Mild bilateral lower extremity edema. Wounds: Clean and dry.  No erythema or signs of infection.  Lab Results: CBC:No results for input(s): WBC, HGB, HCT, PLT in the last 72 hours. BMET:   Recent Labs  02/16/15 0230  NA 140  K 4.4  CL 104  CO2 29  GLUCOSE 102*  BUN 18  CREATININE 1.48*  CALCIUM 8.5*    PT/INR:  Lab Results  Component Value Date   INR 1.50* 02/11/2015   INR 1.23 02/07/2015   INR 1.17 06/20/2014   ABG:  INR: Will add last result for INR, ABG once components are confirmed Will add last 4 CBG results once components are confirmed  Assessment/Plan:  1. CV - Previous hypertension. A fib with CVR-HR in the 70's. On Cardizem SR 60 mg bid and Digoxin 0.125 mg daily. He was on Eliquis prior to surgery and hope to restart soon. 2.  Pulmonary - History of COPD. On room air. 3.  Acute blood loss anemia - Last H and H 9.1 and 27.9 4. History of CKD.  Creatinine yesterday stable at 1.48. Admitting creatinine was 2.03 5. GI-no abdominal pain but with nausea. Zofran PRN nausea. Will start Reglan. Has had bowel movements. Consider KUB if has worsening abdominal symptoms. Stop Oxy. Await am cbc, bmet, and lipase. Etiology to be determined.   ZIMMERMAN,DONIELLE MPA-C 02/18/2015,7:39 AM   Lab Results  Component Value Date   WBC 6.1 02/18/2015   HGB 9.4* 02/18/2015   HCT 29.3* 02/18/2015   PLT 238 02/18/2015   GLUCOSE 157* 02/18/2015   ALT 17 02/07/2015   AST 29 02/07/2015   NA 138 02/18/2015   K 3.5 02/18/2015   CL 103 02/18/2015   CREATININE 1.54* 02/18/2015   BUN 15 02/18/2015   CO2 27 02/18/2015   TSH 3.763 10/15/2014   INR 1.50* 02/11/2015   HGBA1C 6.2* 02/07/2015   Has had pancreatitis in the past , but none since GB removed. Lipase elevated , will check  abdominal US  Start eliquis when bowel issue resolved  I have seen and examined [name] [name] and agree with the above assessment  and plan.  [name] MD Beeper [phone] Office [phone] 02/18/2015 3:20 PM

## 2015-02-18 NOTE — Progress Notes (Signed)
Lipase found to be elevated at 82. He has had pancreatitis a few times in the past. He states this is how he felt when he got it last time. I have made him NPO and will check [place] of [place]. CMP ordered for am.

## 2015-02-19 LAB — COMPREHENSIVE METABOLIC PANEL
ALBUMIN: 3 g/dL — AB (ref 3.5–5.0)
ALT: 45 U/L (ref 17–63)
AST: 40 U/L (ref 15–41)
Alkaline Phosphatase: 119 U/L (ref 38–126)
Anion gap: 8 (ref 5–15)
BUN: 14 mg/dL (ref 6–20)
CALCIUM: 8.8 mg/dL — AB (ref 8.9–10.3)
CO2: 28 mmol/L (ref 22–32)
CREATININE: 1.66 mg/dL — AB (ref 0.61–1.24)
Chloride: 104 mmol/L (ref 101–111)
GFR calc Af Amer: 43 mL/min — ABNORMAL LOW (ref >60)
GFR calc non Af Amer: 37 mL/min — ABNORMAL LOW (ref >60)
Glucose, Bld: 92 mg/dL (ref 65–99)
Potassium: 4.1 mmol/L (ref 3.5–5.1)
SODIUM: 140 mmol/L (ref 135–145)
TOTAL PROTEIN: 5.8 g/dL — AB (ref 6.5–8.1)
Total Bilirubin: 1 mg/dL (ref 0.3–1.2)

## 2015-02-19 LAB — LIPASE, BLOOD: LIPASE: 74 U/L — AB (ref 22–51)

## 2015-02-19 LAB — GLUCOSE, CAPILLARY
Glucose-Capillary: 98 mg/dL (ref 65–99)
Glucose-Capillary: 123 mg/dL — ABNORMAL HIGH (ref 65–99)
Glucose-Capillary: 101 mg/dL — ABNORMAL HIGH (ref 65–99)
Glucose-Capillary: 112 mg/dL — ABNORMAL HIGH (ref 65–99)
Glucose-Capillary: 98 mg/dL (ref 65–99)

## 2015-02-19 LAB — URINALYSIS, ROUTINE W REFLEX MICROSCOPIC
Bilirubin Urine: NEGATIVE
Glucose, UA: NEGATIVE mg/dL
Hgb urine dipstick: NEGATIVE
Ketones, ur: NEGATIVE mg/dL
LEUKOCYTES UA: NEGATIVE
NITRITE: NEGATIVE
PH: 6.5 (ref 5.0–8.0)
Protein, ur: NEGATIVE mg/dL
Specific Gravity, Urine: 1.017 (ref 1.005–1.030)
Urobilinogen, UA: 4 mg/dL — ABNORMAL HIGH (ref 0.0–1.0)

## 2015-02-19 MED ORDER — BISACODYL 10 MG RE SUPP: 10.0000 mg | Freq: Once | RECTAL | Status: DC

## 2015-02-19 MED ORDER — INSULIN ASPART 100 UNIT/ML ~~LOC~~ SOLN
0.0000 [IU] | Freq: Three times a day (TID) | SUBCUTANEOUS | Status: DC
  Administered 2015-02-20: 0 [IU] via SUBCUTANEOUS

## 2015-02-19 NOTE — Progress Notes (Signed)
I met with pt's wife and his son upon their arrival to inpt rehab to discuss the possibility for his admission. Pt had previously been to [facility] and wife is requesting different rehab for his recovery. I discussed the criteria for inpt rehab admission and that I would be following pt's case to assist in determining if he could be admitted pending bed availability when he was medically ready for discharge as well as pt's ability to participate at that time. Wife is requesting other SNF options in case pt unable to be admitted to inpt rehab. I will alert SW of wife's request. I will continue to follow.[phone]

## 2015-02-19 NOTE — Progress Notes (Signed)
Physical Therapy Treatment Patient Details Name: [name] [name] MRN: [id number] DOB: 1932-04-06 Today's Date: 02/19/2015    History of Present Illness Pt adm for AVR and clipping of atrial appendage. PMH - afib, OSA, brain injury with craniotomy in 1960, HTN. History of frequent falls per 09/2014 chart.    PT Comments    Pt progressing towards physical therapy goals. Pt expresses frustration that he is currently not able to ambulate as well as he had been in previous sessions. Required seated rest breaks due to LE weakness and reported feeling that legs were going to give out on him. Pt was instructed in HEP to perform between sessions.   Follow Up Recommendations  CIR;Supervision/Assistance - 24 hour     Equipment Recommendations   (to be determined)    Recommendations for Other Services Rehab consult     Precautions / Restrictions Precautions Precautions: Fall;Sternal Restrictions Weight Bearing Restrictions: Yes (Sternal precautions)    Mobility  Bed Mobility Overal bed mobility: Needs Assistance Bed Mobility: Rolling;Sidelying to Sit Rolling: Min assist Sidelying to sit: Mod assist       General bed mobility comments: vc for technique, assist due to inability to use UEs and weakness  Transfers Overall transfer level: Needs assistance Equipment used: Rolling walker (2 wheeled) Transfers: Sit to/from Stand Sit to Stand: Min assist         General transfer comment: vc for sternal precautions; Assist to bring hips up and for balance.  Ambulation/Gait Ambulation/Gait assistance: Min assist;Min guard Ambulation Distance (Feet): 200 Feet Assistive device: Rolling walker (2 wheeled) Gait Pattern/deviations: Step-through pattern;Decreased stride length;Trunk flexed Gait velocity: Decreased Gait velocity interpretation: Below normal speed for age/gender General Gait Details: Pt required 3 seated rest breaks due to fatigue and weakness. States that he feels his legs  are weak and they will give out. Knees appeared to be buckling at times. Occasional min assist required for safety.    Stairs            Wheelchair Mobility    Modified Rankin (Stroke Patients Only)       Balance Overall balance assessment: History of Falls;Needs assistance Sitting-balance support: Feet supported;No upper extremity supported Sitting balance-Leahy Scale: Fair     Standing balance support: Bilateral upper extremity supported Standing balance-Leahy Scale: Poor                      Cognition Arousal/Alertness: Awake/alert Behavior During Therapy: Impulsive Overall Cognitive Status: Within Functional Limits for tasks assessed Area of Impairment: Safety/judgement;Problem solving;Attention   Current Attention Level: Sustained Memory:  (able to grossly recall precautions)   Safety/Judgement: Decreased awareness of safety;Decreased awareness of deficits   Problem Solving: Requires verbal cues General Comments: required vc for sequencing with transfers to maintain sternal precautions    Exercises General Exercises - Lower Extremity Ankle Circles/Pumps: 10 reps Quad Sets: 10 reps Hip ABduction/ADduction: 10 reps    General Comments        Pertinent Vitals/Pain Pain Assessment: Faces Faces Pain Scale: Hurts little more Pain Location: Chest incision Pain Intervention(s): Limited activity within patient's tolerance;Monitored during session;Repositioned    Home Living                      Prior Function            PT Goals (current goals can now be found in the care plan section) Acute Rehab PT Goals Patient Stated Goal: return home PT Goal Formulation: With  patient Time For Goal Achievement: 02/26/15 Progress towards PT goals: Progressing toward goals    Frequency  Min 3X/week    PT Plan Discharge plan needs to be updated    Co-evaluation             End of Session Equipment Utilized During Treatment: Gait  belt Activity Tolerance: Patient limited by fatigue Patient left: with call bell/phone within reach;in chair;with family/visitor present     Time: 0370-4888 PT Time Calculation (min) (ACUTE ONLY): 24 min  Charges:  $Gait Training: 8-22 mins $Therapeutic Exercise: 8-22 mins                    G Codes:      Conni Slipper Feb 27, 2015, 1:13 PM  Conni Slipper, PT, DPT Acute Rehabilitation Services Pager: [phone]

## 2015-02-19 NOTE — Progress Notes (Signed)
CSW received phone call from [place] with CIR who stated that pt wife is interested in SNF facilities other than [facility] if pt is unable to go to CIR.    CSW provided pt wife with bed offers- pt wife and pt will review the list and come up with an alternative option if pt is unable to be admitted to CIR.  CSW will continue to follow.  Merlyn Lot, LCSWA Clinical Social Worker [phone]

## 2015-02-19 NOTE — Progress Notes (Addendum)
      [address]       [place] [address]             [phone]        8 Days Post-Op Procedure(s) (LRB): AORTIC VALVE REPLACEMENT (AVR) (N/A) CLIPPING OF ATRIAL APPENDAGE (N/A) TRANSESOPHAGEAL ECHOCARDIOGRAM (TEE) (N/A)  Subjective: Patient feeling better this am. Denies abdominal pain,nausea, or vomiting. He wants to eat.  Objective: Vital signs in last 24 hours: Temp:  [98 F (36.7 C)-98.9 F (37.2 C)] 98 F (36.7 C) (06/22 0504) Pulse Rate:  [74-89] 83 (06/22 0504) Cardiac Rhythm:  [-] Atrial fibrillation (06/21 1930) Resp:  [16-20] 20 (06/22 0504) BP: (126-158)/(76-103) 158/92 mmHg (06/22 0504) SpO2:  [94 %-96 %] 95 % (06/22 0504) Weight:  [253 lb (114.76 kg)] 253 lb (114.76 kg) (06/22 0504)  Pre op weight 116 kg Current Weight  02/19/15 253 lb (114.76 kg)      Intake/Output from previous day: 06/21 0701 - 06/22 0700 In: 120 [P.O.:120] Out: 300 [Urine:300]   Physical Exam:  Cardiovascular: IRRR IRRR Pulmonary: Clear to auscultation bilaterally; no rales, wheezes, or rhonchi. Abdomen: Soft, non tender, hypoactive bowel sounds present. Extremities: Mild bilateral lower extremity edema. Wounds: Clean and dry.  No erythema or signs of infection.  Lab Results: CBC:  Recent Labs  02/18/15 1033  WBC 6.1  HGB 9.4*  HCT 29.3*  PLT 238   BMET:   Recent Labs  02/18/15 1033 02/19/15 0407  NA 138 140  K 3.5 4.1  CL 103 104  CO2 27 28  GLUCOSE 157* 92  BUN 15 14  CREATININE 1.54* 1.66*  CALCIUM 8.4* 8.8*    PT/INR:  Lab Results  Component Value Date   INR 1.50* 02/11/2015   INR 1.23 02/07/2015   INR 1.17 06/20/2014   ABG:  INR: Will add last result for INR, ABG once components are confirmed Will add last 4 CBG results once components are confirmed  Assessment/Plan:  1. CV - Previous hypertension. A fib with CVR-HR in the 80's. On Cardizem SR 60 mg bid. Digoxin was stopped yesterday as his heart rate went down in to  the 40's. He was on Eliquis prior to surgery and hope to restart soon. 2.  Pulmonary - History of COPD. On room air. 3.  Acute blood loss anemia - H and H yesterday 9.4 and 29.3 4. History of CKD. Creatinine increased to 1.66. Admitting creatinine was 2.03. He is not on diuretic or ACE 5. GI- No abdominal pain but with nausea. Zofran PRN nausea. Lipase elevated but decreased to 74. LFTS "WNL" .US showed fatty infiltration of liver, small right pleural effusion. He has had pancreatitis in the past. Continue NPO for now and will discuss with Dr. Tyrone Sage. 6. Remove EPW  [name],[name] MPA-C 02/19/2015,7:56 AM    Feels better today, no abdominal pain or nausea, will resume clear liquids Start eliquis after pacing wires are out and no bowel issues Cir soon  I have seen and examined [name] [name] and agree with the above assessment  and plan.  [name] MD [facility] [phone] Office [phone] 02/19/2015 10:08 AM

## 2015-02-19 NOTE — Progress Notes (Signed)
CARDIAC REHAB PHASE I   Second attempt to ambulate with pt today. Pt sitting in recliner, resting, states "I just walked a little bit ago and I don't feel good today." Pt declines ambulation at this time. Pt states "my legs hurt behind my knees." Pt states his "knees feel like they are gonna give out."  Pt states "the therapist gave me some exercises to do to help."  Will follow-up with pt tomorrow.     Joylene Grapes, RN, BSN 02/19/2015 2:35 PM

## 2015-02-19 NOTE — Progress Notes (Signed)
Pt pacing wires removed with charge RN Asher Muir; pt denies any pain, discomfort or distress; pt educated and voices understanding of the 1 hour bedrest; VSS; pacing wires intact upon removal. Will closely monitor pt per protocol. Dionne Bucy RN

## 2015-02-19 NOTE — Progress Notes (Signed)
Stable during shift since removal of pacing wires except sleepy most of the time. UA was sent as ordered. Pt sleeping comfortably in bed with call light within reach. Reported off to incoming RN. Dionne Bucy RN

## 2015-02-19 NOTE — Progress Notes (Signed)
Nurse asked me to talk to family. Son is concerned because patient is sleeping a lot, not much energy. Patient states no specific complaints but he is tired. Last H and H stable at 9.4 and 29.3. Will check glucose to make sure not having episodes of hypoglycemia. Last WBC 6100, remains afebrile, no signs of wound infection. Will check UA as has complaints of burning with urination. He is very deconditioned and recovery will take more time.

## 2015-02-20 ENCOUNTER — Inpatient Hospital Stay (HOSPITAL_COMMUNITY)
Admission: RE | Admit: 2015-02-20 | Discharge: 2015-03-01 | DRG: 945 | Disposition: A | Discharge status: Home Health | Payer: Medicare Other | Source: Intra-hospital | Attending: Physical Medicine & Rehabilitation | Admitting: Physical Medicine & Rehabilitation

## 2015-02-20 DIAGNOSIS — I129 Hypertensive chronic kidney disease with stage 1 through stage 4 chronic kidney disease, or unspecified chronic kidney disease: Secondary | ICD-10-CM | POA: Diagnosis present

## 2015-02-20 DIAGNOSIS — I480 Paroxysmal atrial fibrillation: Secondary | ICD-10-CM | POA: Diagnosis present

## 2015-02-20 DIAGNOSIS — E785 Hyperlipidemia, unspecified: Secondary | ICD-10-CM | POA: Diagnosis present

## 2015-02-20 DIAGNOSIS — K760 Fatty (change of) liver, not elsewhere classified: Secondary | ICD-10-CM | POA: Diagnosis present

## 2015-02-20 DIAGNOSIS — B965 Pseudomonas (aeruginosa) (mallei) (pseudomallei) as the cause of diseases classified elsewhere: Secondary | ICD-10-CM | POA: Diagnosis not present

## 2015-02-20 DIAGNOSIS — Z951 Presence of aortocoronary bypass graft: Secondary | ICD-10-CM | POA: Diagnosis not present

## 2015-02-20 DIAGNOSIS — R531 Weakness: Principal | ICD-10-CM | POA: Diagnosis present

## 2015-02-20 DIAGNOSIS — N390 Urinary tract infection, site not specified: Secondary | ICD-10-CM | POA: Diagnosis not present

## 2015-02-20 DIAGNOSIS — F418 Other specified anxiety disorders: Secondary | ICD-10-CM | POA: Diagnosis present

## 2015-02-20 DIAGNOSIS — K219 Gastro-esophageal reflux disease without esophagitis: Secondary | ICD-10-CM | POA: Diagnosis present

## 2015-02-20 DIAGNOSIS — Z87891 Personal history of nicotine dependence: Secondary | ICD-10-CM | POA: Diagnosis not present

## 2015-02-20 DIAGNOSIS — N401 Enlarged prostate with lower urinary tract symptoms: Secondary | ICD-10-CM | POA: Diagnosis present

## 2015-02-20 DIAGNOSIS — Z22322 Carrier or suspected carrier of Methicillin resistant Staphylococcus aureus: Secondary | ICD-10-CM

## 2015-02-20 DIAGNOSIS — G4733 Obstructive sleep apnea (adult) (pediatric): Secondary | ICD-10-CM | POA: Diagnosis present

## 2015-02-20 DIAGNOSIS — I482 Chronic atrial fibrillation: Secondary | ICD-10-CM | POA: Diagnosis not present

## 2015-02-20 DIAGNOSIS — Z87820 Personal history of traumatic brain injury: Secondary | ICD-10-CM

## 2015-02-20 DIAGNOSIS — R296 Repeated falls: Secondary | ICD-10-CM | POA: Diagnosis present

## 2015-02-20 DIAGNOSIS — I4891 Unspecified atrial fibrillation: Secondary | ICD-10-CM | POA: Diagnosis present

## 2015-02-20 DIAGNOSIS — J449 Chronic obstructive pulmonary disease, unspecified: Secondary | ICD-10-CM | POA: Diagnosis present

## 2015-02-20 DIAGNOSIS — E039 Hypothyroidism, unspecified: Secondary | ICD-10-CM | POA: Diagnosis present

## 2015-02-20 DIAGNOSIS — Z952 Presence of prosthetic heart valve: Secondary | ICD-10-CM | POA: Diagnosis not present

## 2015-02-20 DIAGNOSIS — R5381 Other malaise: Secondary | ICD-10-CM | POA: Diagnosis not present

## 2015-02-20 DIAGNOSIS — D62 Acute posthemorrhagic anemia: Secondary | ICD-10-CM | POA: Diagnosis present

## 2015-02-20 DIAGNOSIS — K5900 Constipation, unspecified: Secondary | ICD-10-CM | POA: Diagnosis present

## 2015-02-20 DIAGNOSIS — Z86718 Personal history of other venous thrombosis and embolism: Secondary | ICD-10-CM

## 2015-02-20 DIAGNOSIS — G8918 Other acute postprocedural pain: Secondary | ICD-10-CM | POA: Diagnosis not present

## 2015-02-20 DIAGNOSIS — E780 Pure hypercholesterolemia: Secondary | ICD-10-CM | POA: Diagnosis present

## 2015-02-20 DIAGNOSIS — G9340 Encephalopathy, unspecified: Secondary | ICD-10-CM | POA: Diagnosis present

## 2015-02-20 DIAGNOSIS — I2510 Atherosclerotic heart disease of native coronary artery without angina pectoris: Secondary | ICD-10-CM | POA: Diagnosis present

## 2015-02-20 DIAGNOSIS — Z9181 History of falling: Secondary | ICD-10-CM | POA: Diagnosis not present

## 2015-02-20 DIAGNOSIS — N39498 Other specified urinary incontinence: Secondary | ICD-10-CM | POA: Diagnosis present

## 2015-02-20 DIAGNOSIS — R110 Nausea: Secondary | ICD-10-CM | POA: Diagnosis present

## 2015-02-20 DIAGNOSIS — Z954 Presence of other heart-valve replacement: Secondary | ICD-10-CM | POA: Diagnosis not present

## 2015-02-20 DIAGNOSIS — N189 Chronic kidney disease, unspecified: Secondary | ICD-10-CM | POA: Diagnosis present

## 2015-02-20 DIAGNOSIS — H9190 Unspecified hearing loss, unspecified ear: Secondary | ICD-10-CM | POA: Diagnosis present

## 2015-02-20 LAB — BASIC METABOLIC PANEL
ANION GAP: 7 (ref 5–15)
BUN: 11 mg/dL (ref 6–20)
CO2: 27 mmol/L (ref 22–32)
Calcium: 8.8 mg/dL — ABNORMAL LOW (ref 8.9–10.3)
Chloride: 104 mmol/L (ref 101–111)
Creatinine, Ser: 1.54 mg/dL — ABNORMAL HIGH (ref 0.61–1.24)
GFR calc Af Amer: 47 mL/min — ABNORMAL LOW (ref >60)
GFR, EST NON AFRICAN AMERICAN: 41 mL/min — AB (ref >60)
Glucose, Bld: 89 mg/dL (ref 65–99)
POTASSIUM: 3.9 mmol/L (ref 3.5–5.1)
SODIUM: 138 mmol/L (ref 135–145)

## 2015-02-20 LAB — GLUCOSE, CAPILLARY
Glucose-Capillary: 96 mg/dL (ref 65–99)
Glucose-Capillary: 108 mg/dL — ABNORMAL HIGH (ref 65–99)

## 2015-02-20 MED ORDER — CLONAZEPAM 0.5 MG PO TABS
0.5000 mg | ORAL_TABLET | Freq: Every day | ORAL | Status: DC
  Administered 2015-02-20 – 2015-02-28 (×9): 0.5 mg via ORAL
  Filled 2015-02-20 (×9): qty 1

## 2015-02-20 MED ORDER — PANTOPRAZOLE SODIUM 40 MG PO TBEC
40.0000 mg | DELAYED_RELEASE_TABLET | Freq: Every day | ORAL | Status: DC
  Administered 2015-02-21 – 2015-03-01 (×8): 40 mg via ORAL
  Filled 2015-02-20 (×7): qty 1

## 2015-02-20 MED ORDER — LEVOTHYROXINE SODIUM 150 MCG PO TABS
150.0000 ug | ORAL_TABLET | Freq: Every day | ORAL | Status: DC
Start: 2015-02-21 — End: 2015-03-01
  Administered 2015-02-23 – 2015-03-01 (×7): 150 ug via ORAL
  Filled 2015-02-20 (×10): qty 1

## 2015-02-20 MED ORDER — ONDANSETRON HCL 4 MG PO TABS: 4.0000 mg | ORAL_TABLET | Freq: Four times a day (QID) | ORAL | Status: DC | PRN

## 2015-02-20 MED ORDER — DOCUSATE SODIUM 100 MG PO CAPS
200.0000 mg | ORAL_CAPSULE | Freq: Every day | ORAL | Status: DC
  Administered 2015-02-21 – 2015-02-25 (×5): 200 mg via ORAL
  Filled 2015-02-20 (×6): qty 2

## 2015-02-20 MED ORDER — ALBUTEROL SULFATE (2.5 MG/3ML) 0.083% IN NEBU: 2.5000 mg | INHALATION_SOLUTION | Freq: Four times a day (QID) | RESPIRATORY_TRACT | Status: DC | PRN

## 2015-02-20 MED ORDER — ASPIRIN 81 MG PO CHEW
81.0000 mg | CHEWABLE_TABLET | Freq: Every day | ORAL | Status: DC
  Administered 2015-02-21 – 2015-03-01 (×4): 81 mg
  Filled 2015-02-20 (×5): qty 1

## 2015-02-20 MED ORDER — BUPROPION HCL ER (XL) 300 MG PO TB24
300.0000 mg | ORAL_TABLET | Freq: Every day | ORAL | Status: DC
Start: 2015-02-21 — End: 2015-03-01
  Administered 2015-02-21 – 2015-03-01 (×9): 300 mg via ORAL
  Filled 2015-02-20 (×10): qty 1

## 2015-02-20 MED ORDER — ONDANSETRON HCL 4 MG/2ML IJ SOLN: 4.0000 mg | Freq: Four times a day (QID) | INTRAMUSCULAR | Status: DC | PRN

## 2015-02-20 MED ORDER — DILTIAZEM HCL ER 60 MG PO CP12
60.0000 mg | ORAL_CAPSULE | Freq: Two times a day (BID) | ORAL | Status: DC
  Administered 2015-02-20 – 2015-03-01 (×18): 60 mg via ORAL
  Filled 2015-02-20 (×20): qty 1

## 2015-02-20 MED ORDER — SORBITOL 70 % SOLN
30.0000 mL | Freq: Every day | Status: DC | PRN
  Administered 2015-02-26: 30 mL via ORAL
  Filled 2015-02-20: qty 30

## 2015-02-20 MED ORDER — BUSPIRONE HCL 15 MG PO TABS
15.0000 mg | ORAL_TABLET | Freq: Two times a day (BID) | ORAL | Status: DC
  Administered 2015-02-20 – 2015-03-01 (×18): 15 mg via ORAL
  Filled 2015-02-20 (×22): qty 1

## 2015-02-20 MED ORDER — DILTIAZEM HCL ER 60 MG PO CP12: 60.0000 mg | ORAL_CAPSULE | Freq: Two times a day (BID) | ORAL | Status: DC

## 2015-02-20 MED ORDER — ASPIRIN 81 MG PO TBEC: 81.0000 mg | DELAYED_RELEASE_TABLET | Freq: Every day | ORAL | Status: DC

## 2015-02-20 MED ORDER — TRAMADOL HCL 50 MG PO TABS: 50.0000 mg | ORAL_TABLET | ORAL | Status: DC | PRN

## 2015-02-20 MED ORDER — APIXABAN 2.5 MG PO TABS
2.5000 mg | ORAL_TABLET | Freq: Two times a day (BID) | ORAL | Status: DC
  Administered 2015-02-20: 2.5 mg via ORAL
  Filled 2015-02-20 (×2): qty 1

## 2015-02-20 MED ORDER — FLUTICASONE PROPIONATE 50 MCG/ACT NA SUSP
1.0000 | Freq: Every day | NASAL | Status: DC
  Administered 2015-02-22 – 2015-02-28 (×7): 1 via NASAL
  Filled 2015-02-20: qty 16

## 2015-02-20 MED ORDER — AMLODIPINE BESYLATE 5 MG PO TABS
5.0000 mg | ORAL_TABLET | Freq: Every day | ORAL | Status: DC
  Administered 2015-02-20: 5 mg via ORAL
  Filled 2015-02-20: qty 1

## 2015-02-20 MED ORDER — ESCITALOPRAM OXALATE 20 MG PO TABS
20.0000 mg | ORAL_TABLET | Freq: Every day | ORAL | Status: DC
Start: 2015-02-21 — End: 2015-03-01
  Administered 2015-02-21 – 2015-03-01 (×9): 20 mg via ORAL
  Filled 2015-02-20 (×12): qty 1

## 2015-02-20 MED ORDER — AMLODIPINE BESYLATE 5 MG PO TABS
5.0000 mg | ORAL_TABLET | Freq: Every day | ORAL | Status: DC
  Administered 2015-02-21 – 2015-02-28 (×8): 5 mg via ORAL
  Filled 2015-02-20 (×9): qty 1

## 2015-02-20 MED ORDER — LAMOTRIGINE 100 MG PO TABS
100.0000 mg | ORAL_TABLET | Freq: Two times a day (BID) | ORAL | Status: DC
  Administered 2015-02-20 – 2015-03-01 (×18): 100 mg via ORAL
  Filled 2015-02-20 (×20): qty 1

## 2015-02-20 MED ORDER — ATORVASTATIN CALCIUM 10 MG PO TABS
10.0000 mg | ORAL_TABLET | Freq: Every day | ORAL | Status: DC
  Administered 2015-02-21 – 2015-03-01 (×9): 10 mg via ORAL
  Filled 2015-02-20 (×10): qty 1

## 2015-02-20 MED ORDER — ASPIRIN EC 81 MG PO TBEC
81.0000 mg | DELAYED_RELEASE_TABLET | Freq: Every day | ORAL | Status: DC
  Administered 2015-02-22 – 2015-02-26 (×4): 81 mg via ORAL
  Filled 2015-02-20 (×10): qty 1

## 2015-02-20 MED ORDER — TRAMADOL HCL 50 MG PO TABS
50.0000 mg | ORAL_TABLET | ORAL | Status: DC | PRN
  Administered 2015-02-20 – 2015-02-28 (×18): 50 mg via ORAL
  Filled 2015-02-20 (×21): qty 1

## 2015-02-20 MED ORDER — APIXABAN 2.5 MG PO TABS
2.5000 mg | ORAL_TABLET | Freq: Two times a day (BID) | ORAL | Status: DC
  Administered 2015-02-20 – 2015-03-01 (×18): 2.5 mg via ORAL
  Filled 2015-02-20 (×20): qty 1

## 2015-02-20 MED ORDER — DUTASTERIDE 0.5 MG PO CAPS
0.5000 mg | ORAL_CAPSULE | Freq: Every day | ORAL | Status: DC
  Administered 2015-02-21 – 2015-03-01 (×9): 0.5 mg via ORAL
  Filled 2015-02-20 (×10): qty 1

## 2015-02-20 NOTE — H&P (Signed)
Physical Medicine and Rehabilitation Admission H&P   Chief complaint: Weakness  HPI: [name] [name] is a 81 y.o. right handed male with history of atrial fibrillation maintained onEliquis, chronic renal insufficiency with baseline creatinine 1.69, Traumatic brain injury with craniotomy 1960. Presented 02/11/2015 with history of severe aortic insufficiency and has been followed at [facility] in the past. Patient lives with his wife using a walker and cane at times prior to admission with history of frequent falls. Patient with recent nursing home placement February 2016 after right proximal fibula fracture after a fall. Evaluation by cardiothoracic surgery for aortic valve insufficiency and underwent aortic valve replacement 02/17/2015 per Dr. Servando Snare. Hospital course pain management. Eliquis resumed postoperatively. Contact precautions for MRSA PCR screen positive. Bouts of nausea with history of pancreatitis lipase level 82 and diet slowly advanced. Ultrasound gallbladder showed mild fatty infiltration of liver otherwise unremarkable. Physical therapy ongoing and easily fatigued. M.D. has requested physical medicine rehabilitation consult. Patient was admitted for comprehensive rehabilitation program  Patient states he still has pain while coughing. Otherwise his incision is not painful unless he moves  ROS Review of Systems  Constitutional: Negative for fever and chills.  HENT: Positive for hearing loss.  Respiratory:   Shortness of breath on exertion  Cardiovascular: Positive for palpitations and leg swelling.  Gastrointestinal: Positive for constipation. Nausea and heartburn  GERD  Genitourinary:   Urinary incontinence  Musculoskeletal: Positive for myalgias, joint pain and falls.  Skin: Negative for rash.  Neurological: Positive for weakness. Negative for headaches.   Syncope  Psychiatric/Behavioral: Positive for depression and memory loss.    Anxiety    Past Medical History  Diagnosis Date  . Atrial fibrillation   . Hypothyroidism   . Plantar fasciitis   . Seasonal allergies   . Decreased testosterone level   . Varicose veins   . Hypertension   . Hypercholesteremia   . Aortic valve insufficiency   . Depression   . Heart murmur   . DVT (deep venous thrombosis) ~ 2013    "BLE"  . OSA (obstructive sleep apnea)     "suppose to wear mask; I throw it off in my sleep" (10/15/2014)  . COPD (chronic obstructive pulmonary disease)   . Emphysema of lung   . History of blood transfusion 1960    "lots; related to an accident"  . Arthritis     "wee bit; knees, elbows" (10/15/2014)  . Anxiety   . Falls frequently     > 20 times in the last year/notes 10/15/2014  . Syncope and collapse "several times"  . Complication of anesthesia     unsure of complications but pt. states there were complications  . Dysrhythmia     A. Fib  . Coronary artery disease   . Shortness of breath dyspnea   . Chronic kidney disease     "down to ~ 1/2 of their regular use; I see kidney dr. in [place]" (10/15/2014)  . Urine frequency   . Urine incontinence   . Enlarged prostate   . GERD (gastroesophageal reflux disease)   . Difficult intubation     difficult intubation 12/03/09 and 03/27/10 ([facility]); glidescope used 03/27/10   Past Surgical History  Procedure Laterality Date  . Abdominal aortic aneurysm repair  01/2006; 03/10/2006    Archie Endo 01/12/2011  . Tonsillectomy    . Hernia repair    . Laparoscopic incisional / umbilical / ventral hernia repair  02/2010    VHR w/mesh/notes 03/28/2010  .  Laparoscopic cholecystectomy  08/2009    w/IOC/notes 09/18/2009  . Ercp  11/2009    Archie Endo 12/05/2009  . Gall stone    . Cardiac catheterization  1990's X 1; 09/2014    no stents  . Nose  surgery    . Brain surgery  1960 X 4    "S/P got my skull busted"; pt. states removed internal carotid artery  . Aortic valve replacement N/A 02/11/2015    Procedure: AORTIC VALVE REPLACEMENT (AVR); Surgeon: Grace Isaac, MD; Location: [place]; Service: Open Heart Surgery; Laterality: N/A;  . Clipping of atrial appendage N/A 02/11/2015    Procedure: CLIPPING OF ATRIAL APPENDAGE; Surgeon: Grace Isaac, MD; Location: [place]; Service: Open Heart Surgery; Laterality: N/A;  . Tee without cardioversion N/A 02/11/2015    Procedure: TRANSESOPHAGEAL ECHOCARDIOGRAM (TEE); Surgeon: Grace Isaac, MD; Location: [place]; Service: Open Heart Surgery; Laterality: N/A;   Family History  Problem Relation Age of Onset  . Heart disease Father   . Rheum arthritis Mother   . Rheum arthritis Father   . Cancer Sister   . Stroke Sister    Social History:  reports that he quit smoking about 40 years ago. His smoking use included Cigarettes. He has a 44 pack-year smoking history. He has never used smokeless tobacco. He reports that he drinks alcohol. He reports that he does not use illicit drugs. Allergies: No Known Allergies Medications Prior to Admission  Medication Sig Dispense Refill  . amLODipine (NORVASC) 5 MG tablet Take 5 mg by mouth daily.    Marland Kitchen apixaban (ELIQUIS) 2.5 MG TABS tablet Take 1 tablet (2.5 mg total) by mouth 2 (two) times daily. 60 tablet 3  . buPROPion (WELLBUTRIN XL) 300 MG 24 hr tablet Take 300 mg by mouth daily.    . busPIRone (BUSPAR) 15 MG tablet Take 15 mg by mouth 2 (two) times daily.    . clonazePAM (KLONOPIN) 0.5 MG tablet Take 1 tablet (0.5 mg total) by mouth at bedtime as needed for anxiety. 30 tablet 0  . digoxin (LANOXIN) 0.125 MG tablet Take 0.125 mg by mouth daily.    Marland Kitchen diltiazem (TIAZAC) 120 MG 24 hr capsule Take 120 mg by mouth daily.    Marland Kitchen dutasteride (AVODART)  0.5 MG capsule Take 0.5 mg by mouth daily.    Marland Kitchen escitalopram (LEXAPRO) 20 MG tablet Take 20 mg by mouth daily.    . Eszopiclone (ESZOPICLONE) 3 MG TABS Take 6 mg by mouth at bedtime. Take immediately before bedtime    . folic acid (FOLVITE) 1 MG tablet Take 1 mg by mouth daily.    Marland Kitchen lamoTRIgine (LAMICTAL) 100 MG tablet Take 100 mg by mouth 2 (two) times daily. Breakfast and dinner    . levothyroxine (SYNTHROID, LEVOTHROID) 150 MCG tablet Take 150 mcg by mouth daily before breakfast.    . lisinopril (PRINIVIL,ZESTRIL) 40 MG tablet Take 40 mg by mouth at bedtime.  3  . Melatonin 3 MG TABS Take 1 tablet by mouth at bedtime.    Marland Kitchen acetaminophen (TYLENOL) 325 MG tablet Take 2 tablets (650 mg total) by mouth every 6 (six) hours as needed for mild pain (or Fever >/= 101).    Marland Kitchen albuterol (PROVENTIL HFA;VENTOLIN HFA) 108 (90 BASE) MCG/ACT inhaler Inhale 2 puffs into the lungs every 6 (six) hours as needed for wheezing or shortness of breath.    Marland Kitchen atorvastatin (LIPITOR) 10 MG tablet Take 10 mg by mouth daily.  0  . calcium carbonate (  TUMS - DOSED IN MG ELEMENTAL CALCIUM) 500 MG chewable tablet Chew 1 tablet by mouth as needed for indigestion or heartburn.    . fluticasone (FLONASE) 50 MCG/ACT nasal spray Place 1 spray into both nostrils daily. (Patient taking differently: Place 1 spray into both nostrils daily as needed for allergies. ) 9.9 g 2    Home: Home Living Family/patient expects to be discharged to:: Skilled nursing facility Living Arrangements: Spouse/significant other Available Help at Discharge: Family (wife unable to physically assist pt) Type of Home: House Home Access: Stairs to enter Technical brewer of Steps: 4 Entrance Stairs-Rails: None Home Layout: Multi-level Alternate Level Stairs-Number of Steps: 13 Alternate Level Stairs-Rails: Left Home Equipment: Environmental consultant - 4 wheels, Walker - 2 wheels, [place] - quad, [place] -  single point  Functional History: Prior Function Level of Independence: Independent Comments: Pt reports he was amb without assistive device. History of frequent falls.  Functional Status:  Mobility: Bed Mobility Overal bed mobility: Needs Assistance Bed Mobility: Rolling, Sidelying to Sit Rolling: Min assist Sidelying to sit: Mod assist Supine to sit: +2 for physical assistance, Max assist Sit to sidelying: Min assist, +2 for physical assistance, +2 for safety/equipment General bed mobility comments: vc for technique, assist due to inability to use UEs and weakness Transfers Overall transfer level: Needs assistance Equipment used: Rolling walker (2 wheeled) Transfers: Sit to/from Stand Sit to Stand: Min assist Stand pivot transfers: +2 physical assistance, Mod assist General transfer comment: vc for sternal precautions; Assist to bring hips up and for balance. Ambulation/Gait Ambulation/Gait assistance: Min assist Ambulation Distance (Feet): 100 Feet Assistive device: Rolling walker (2 wheeled) General Gait Details: good cadence and step length; vc for proper use of RW Gait Pattern/deviations: Step-through pattern, Trunk flexed Gait velocity: almost too fast with RW getting too far ahead at times; pt becomes dyspneic Gait velocity interpretation: at or above normal speed for age/gender    ADL:    Cognition: Cognition Overall Cognitive Status: No family/caregiver present to determine baseline cognitive functioning Orientation Level: Oriented X4 Cognition Arousal/Alertness: Awake/alert Behavior During Therapy: Impulsive Overall Cognitive Status: No family/caregiver present to determine baseline cognitive functioning Area of Impairment: Safety/judgement, Problem solving, Attention Current Attention Level: Sustained Memory: (able to grossly recall precautions) Safety/Judgement: Decreased awareness of safety, Decreased awareness of deficits Problem Solving: Requires  verbal cues General Comments: required vc for sequencing with transfers to maintain sternal precautions  Physical Exam: Blood pressure 126/80, pulse 84, temperature 98.4 F (36.9 C), temperature source Oral, resp. rate 16, height 6' (1.829 m), weight 112.265 kg (247 lb 8 oz), SpO2 94 %. Physical Exam HENT:  Head: Normocephalic.  Eyes: EOM are normal.  Neck: Normal range of motion. Neck supple. No thyromegaly present.  Cardiovascular:  Cardiac rate controlled  Respiratory: Effort normal and breath sounds normal. No respiratory distress.  GI: Soft. Bowel sounds are normal. He exhibits no distension.  Neurological: He is alert.  Oriented to person, place and age. Fair awareness of deficits. Follows simple commands. 4/5 ue's and 3-4/5 prox to distal in LE's.  Skin:  Wounds clean, well approximated.  Psychiatric: He has a normal mood and affect. His behavior is normal Sensation intact to light touch in both upper and lower limbs Musculoskeletal no pain with range of motion in bilateral upper or lower limbs no joint swelling noted in the upper or lower limbs   Lab Results Last 48 Hours    Results for orders placed or performed during the hospital encounter of 02/11/15 (from  the past 48 hour(s))  Glucose, capillary Status: Abnormal   Collection Time: 02/16/15 4:49 PM  Result Value Ref Range   Glucose-Capillary 114 (H) 65 - 99 mg/dL  Glucose, capillary Status: Abnormal   Collection Time: 02/16/15 9:16 PM  Result Value Ref Range   Glucose-Capillary 108 (H) 65 - 99 mg/dL  Glucose, capillary Status: Abnormal   Collection Time: 02/17/15 7:03 AM  Result Value Ref Range   Glucose-Capillary 105 (H) 65 - 99 mg/dL  Glucose, capillary Status: None   Collection Time: 02/17/15 12:13 PM  Result Value Ref Range   Glucose-Capillary 98 65 - 99 mg/dL  Glucose, capillary Status: None   Collection Time: 02/17/15 4:28 PM   Result Value Ref Range   Glucose-Capillary 98 65 - 99 mg/dL   Comment 1 Notify RN   Glucose, capillary Status: Abnormal   Collection Time: 02/17/15 9:53 PM  Result Value Ref Range   Glucose-Capillary 100 (H) 65 - 99 mg/dL  Glucose, capillary Status: None   Collection Time: 02/18/15 6:14 AM  Result Value Ref Range   Glucose-Capillary 87 65 - 99 mg/dL  CBC Status: Abnormal   Collection Time: 02/18/15 10:33 AM  Result Value Ref Range   WBC 6.1 4.0 - 10.5 K/uL   RBC 3.10 (L) 4.22 - 5.81 MIL/uL   Hemoglobin 9.4 (L) 13.0 - 17.0 g/dL   HCT 29.3 (L) 39.0 - 52.0 %   MCV 94.5 78.0 - 100.0 fL   MCH 30.3 26.0 - 34.0 pg   MCHC 32.1 30.0 - 36.0 g/dL   RDW 15.8 (H) 11.5 - 15.5 %   Platelets 238 150 - 400 K/uL  Basic metabolic panel Status: Abnormal   Collection Time: 02/18/15 10:33 AM  Result Value Ref Range   Sodium 138 135 - 145 mmol/L   Potassium 3.5 3.5 - 5.1 mmol/L   Chloride 103 101 - 111 mmol/L   CO2 27 22 - 32 mmol/L   Glucose, Bld 157 (H) 65 - 99 mg/dL   BUN 15 6 - 20 mg/dL   Creatinine, Ser 1.54 (H) 0.61 - 1.24 mg/dL   Calcium 8.4 (L) 8.9 - 10.3 mg/dL   GFR calc non Af Amer 41 (L) >60 mL/min   GFR calc Af Amer 47 (L) >60 mL/min    Comment: (NOTE) The eGFR has been calculated using the CKD EPI equation. This calculation has not been validated in all clinical situations. eGFR's persistently <60 mL/min signify possible Chronic Kidney Disease.    Anion gap 8 5 - 15  Lipase, blood Status: Abnormal   Collection Time: 02/18/15 10:33 AM  Result Value Ref Range   Lipase 82 (H) 22 - 51 U/L  Glucose, capillary Status: Abnormal   Collection Time: 02/18/15 11:34 AM  Result Value Ref Range   Glucose-Capillary 103 (H) 65 - 99 mg/dL   Comment 1 Notify RN       Imaging Results (Last 48 hours)    No results  found.       Medical Problem List and Plan: 1. Functional deficits secondary to debilitation related to aortic Insufficiency and subsequent AVR 2. DVT Prophylaxis/Anticoagulation: Eliquis 3. Pain Management: Ultram as needed 4. Mood/depression/anxiety: Lamictal 100 mg twice a day, Lexapro 20 mg daily, Klonopin 0.5 mg daily at bedtime, BuSpar 15 mg twice a day, Wellbutrin 300 mg daily. Provide emotional support 5. Neuropsych: This patient is capable of making decisions on his own behalf. 6. Skin/Wound Care: Routine skin checks 7. Fluids/Electrolytes/Nutrition: Routine I&O with follow-up  chemistries 8. Hypertension /Atrial fibrillation. Cardizem 60 mg every 12 hours, Norvasc 5 mg daily. Cardiac rate controlled. 9. Chronic renal insufficiency with baseline creatinine 1.69. Follow-up chemistries 10. History of traumatic brain injury with craniotomy 1960. Independent prior to admission using a cane and/or walker 11. Recent fall with right proximal fibula fracture February 2016. Weightbearing as tolerated 12. Hyperlipidemia. Lipitor 13. Hypothyroidism. Synthroid 14. MRSA PCR screening positive. Contact precautions 15. Acute blood loss anemia. Follow-up CBC 16. Intermittent nausea with history of pancreatitis. Lipase level of 82. Ultrasound of abdomen with mild fatty infiltration of liver otherwise unremarkable. Full liquid diet and advance as tolerated 17.BPH. Continue Avodart. Check PVR 3     Post Admission Physician Evaluation: 1. Functional deficits secondary to debilitation related to aortic Insufficiency and subsequent AVR. 2. Patient is admitted to receive collaborative, interdisciplinary care between the physiatrist, rehab nursing staff, and therapy team. 3. Patient's level of medical complexity and substantial therapy needs in context of that medical necessity cannot be provided at a lesser intensity of care such as a SNF. 4. Patient has experienced substantial functional loss  from his/her baseline which was documented above under the "Functional History" and "Functional Status" headings. Judging by the patient's diagnosis, physical exam, and functional history, the patient has potential for functional progress which will result in measurable gains while on inpatient rehab. These gains will be of substantial and practical use upon discharge in facilitating mobility and self-care at the household level. 5. Physiatrist will provide 24 hour management of medical needs as well as oversight of the therapy plan/treatment and provide guidance as appropriate regarding the interaction of the two. 6. 24 hour rehab nursing will assist with bowel management, safety, skin/wound care, disease management, medication administration, pain management and patient education and help integrate therapy concepts, techniques,education, etc. 7. PT will assess and treat for/with: pre gait, gait training, endurance , safety, equipment, neuromuscular re education. Goals are: Modified independent mobility. 8. OT will assess and treat for/with: ADLs, Cognitive perceptual skills, Neuromuscular re education, safety, endurance, equipment. Goals are: mod I. Therapy may proceed with showering this patient. 9. SLP will assess and treat for/with: NA. Goals are: NA. 10. Case Management and Social Worker will assess and treat for psychological issues and discharge planning. 11. Team conference will be held weekly to assess progress toward goals and to determine barriers to discharge. 12. Patient will receive at least 3 hours of therapy per day at least 5 days per week. 13. ELOS: 7-10d  14. Prognosis: excellent     Charlett Blake M.D. [facility] FAAPM&R (Sports Med, Neuromuscular Med) Diplomate Am Board of Electrodiagnostic Med  02/20/2015

## 2015-02-20 NOTE — PMR Pre-admission (Signed)
PMR Admission Coordinator Pre-Admission Assessment  Patient: [name] [name] is an 81 y.o., male MRN: [id number] DOB: July 17, 1932 Height: 6' (182.9 cm) Weight: 114 kg (251 lb 5.2 oz)              Insurance Information HMO:     PPO:      PCP:      IPA:      80/20:      OTHER:  PRIMARY: Medicare  A and B      Policy#:  [id number]      Subscriber:  self CM Name:       Phone#:      Fax#:  Pre-Cert#:       Employer: retired Benefits:  Phone #:      Name:  Eff. Date: 09/30/1998     Deduct:  $1288      Out of Pocket Max:  no      Life Max:  none CIR:  100%      SNF:  100% first 20 days Outpatient:  80%     Co-Pay: 20% Home Health:  100%      Co-Pay:   DME:  80%     Co-Pay:  20% Providers:  Pt.'s choice SECONDARY:  [employer] [facility]      Policy#:  [id number]       Subscriber:  self CM Name:       Phone#:      Fax#:  Pre-Cert#:       Employer:  Benefits:  Phone #: [phone]     Name:  Eff. Date:      Deduct:       Out of Pocket Max:       Life Max:  CIR:       SNF:  Outpatient:      Co-Pay:  Home Health:       Co-Pay:  DME:      Co-Pay:   Emergency Contact Information Contact Information    Name Relation Home Work Mobile   Pavone,[name] Son   [phone]   Mayorga,[name] Spouse [phone]       Current Medical History  Patient Admitting Diagnosis: debility related to AS and subsequent AVR History of Present Illness: [name] [name] is a 81 y.o. right handed male with history of atrial fibrillation maintained onEliquis, chronic renal insufficiency with baseline creatinine 1.69, Traumatic brain injury with craniotomy 1960. Presented 02/11/2015 with history of severe aortic insufficiency and has been followed at [facility] in the past. Patient lives with his wife using a walker and cane at times prior to admission with history of frequent falls. Patient with recent nursing home placement February 2016 after right proximal fibula fracture after a fall. Evaluation by  cardiothoracic surgery for aortic valve insufficiency and underwent aortic valve replacement 02/17/2015 per Dr. Tyrone Sage. Hospital course pain management. Eliquis resumed postoperatively. Contact precautions for MRSA PCR screen positive. Bouts of nausea with history of pancreatitis lipase level 82 and diet slowly advanced. Ultrasound gallbladder showed mild fatty infiltration of liver otherwise unremarkable. Physical therapy ongoing and easily fatigued. M.D. has requested physical medicine rehabilitation consult. Patient was admitted for comprehensive rehabilitation program      Past Medical History  Past Medical History  Diagnosis Date  . Atrial fibrillation   . Hypothyroidism   . Plantar fasciitis   . Seasonal allergies   . Decreased testosterone level   . Varicose veins   . Hypertension   . Hypercholesteremia   . Aortic  valve insufficiency   . Depression   . Heart murmur   . DVT (deep venous thrombosis) ~ 2013    "BLE"  . OSA (obstructive sleep apnea)     "suppose to wear mask; I throw it off in my sleep" (10/15/2014)  . COPD (chronic obstructive pulmonary disease)   . Emphysema of lung   . History of blood transfusion 1960    "lots; related to an accident"  . Arthritis     "wee bit; knees, elbows" (10/15/2014)  . Anxiety   . Falls frequently     > 20 times in the last year/notes 10/15/2014  . Syncope and collapse "several times"  . Complication of anesthesia     unsure of complications but pt. states there were complications  . Dysrhythmia     A. Fib  . Coronary artery disease   . Shortness of breath dyspnea   . Chronic kidney disease     "down to ~ 1/2 of their regular use; I see kidney dr. in [place]" (10/15/2014)  . Urine frequency   . Urine incontinence   . Enlarged prostate   . GERD (gastroesophageal reflux disease)   . Difficult intubation     difficult intubation 12/03/09 and 03/27/10 ([facility]); glidescope used 03/27/10    Family History  family history includes  Cancer in his sister; Heart disease in his father; Rheum arthritis in his father and mother; Stroke in his sister.  Prior Rehab/Hospitalizations:  Has the patient had major surgery during 100 days prior to admission? No; he has had 2 SNF stays at [facility] prior to this admission   Current Medications   Current facility-administered medications:  .  0.45 % sodium chloride infusion, , Intravenous, Continuous PRN, Erin R Barrett, PA-C, Last Rate: 20 mL/hr at 02/12/15 0800 .  0.9 %  sodium chloride infusion, 250 mL, Intravenous, Continuous, Erin R Barrett, PA-C, Last Rate: 1 mL/hr at 02/12/15 0511, 250 mL at 02/12/15 0511 .  0.9 %  sodium chloride infusion, , Intravenous, Continuous, Erin R Barrett, PA-C, Last Rate: 20 mL/hr at 02/12/15 0012, 20 mL at 02/12/15 0012 .  albuterol (PROVENTIL) (2.5 MG/3ML) 0.083% nebulizer solution 2.5 mg, 2.5 mg, Nebulization, Q6H PRN, [name], MD .  amLODipine (NORVASC) tablet 5 mg, 5 mg, Oral, Daily, Donielle M Zimmerman, PA-C, 5 mg at 02/20/15 1119 .  apixaban (ELIQUIS) tablet 2.5 mg, 2.5 mg, Oral, BID, Donielle M Zimmerman, PA-C, 2.5 mg at 02/20/15 1113 .  aspirin EC tablet 81 mg, 81 mg, Oral, Daily, 81 mg at 02/20/15 1115 **OR** aspirin chewable tablet 81 mg, 81 mg, Per Tube, Daily, [name], MD .  atorvastatin (LIPITOR) tablet 10 mg, 10 mg, Oral, Daily, Erin R Barrett, PA-C, 10 mg at 02/20/15 1114 .  bisacodyl (DULCOLAX) EC tablet 10 mg, 10 mg, Oral, Daily, 10 mg at 02/19/15 1017 **OR** bisacodyl (DULCOLAX) suppository 10 mg, 10 mg, Rectal, Daily, Erin R Barrett, PA-C .  buPROPion (WELLBUTRIN XL) 24 hr tablet 300 mg, 300 mg, Oral, Daily, Erin R Barrett, PA-C, 300 mg at 02/20/15 1115 .  busPIRone (BUSPAR) tablet 15 mg, 15 mg, Oral, BID, Erin R Barrett, PA-C, 15 mg at 02/20/15 1117 .  clonazePAM (KLONOPIN) tablet 0.5 mg, 0.5 mg, Oral, QHS, Erin R Barrett, PA-C, 0.5 mg at 02/19/15 2144 .  diltiazem (CARDIZEM SR) 12 hr capsule 60 mg, 60 mg, Oral,  Q12H, [name], MD, 60 mg at 02/20/15 1118 .  docusate sodium (COLACE) capsule 200 mg,  200 mg, Oral, Daily, Erin R Barrett, PA-C, 200 mg at 02/20/15 1115 .  dutasteride (AVODART) capsule 0.5 mg, 0.5 mg, Oral, Daily, Erin R Barrett, PA-C, 0.5 mg at 02/20/15 1118 .  escitalopram (LEXAPRO) tablet 20 mg, 20 mg, Oral, Daily, Erin R Barrett, PA-C, 20 mg at 02/20/15 1116 .  fluticasone (FLONASE) 50 MCG/ACT nasal spray 1 spray, 1 spray, Each Nare, Daily, Erin R Barrett, PA-C, 1 spray at 02/20/15 1117 .  insulin aspart (novoLOG) injection 0-15 Units, 0-15 Units, Subcutaneous, TID WC, Donielle Margaretann Loveless, PA-C, 0 Units at 02/20/15 1130 .  lamoTRIgine (LAMICTAL) tablet 100 mg, 100 mg, Oral, BID, Erin R Barrett, PA-C, 100 mg at 02/20/15 1114 .  levothyroxine (SYNTHROID, LEVOTHROID) tablet 150 mcg, 150 mcg, Oral, QAC breakfast, Erin R Barrett, PA-C, 150 mcg at 02/20/15 0620 .  ondansetron (ZOFRAN) injection 4 mg, 4 mg, Intravenous, Q6H PRN, Erin R Barrett, PA-C .  pantoprazole (PROTONIX) EC tablet 40 mg, 40 mg, Oral, Daily, Erin R Barrett, PA-C, 40 mg at 02/19/15 1017 .  potassium chloride SA (K-DUR,KLOR-CON) CR tablet 20 mEq, 20 mEq, Oral, Q4H PRN, [name], MD .  sodium chloride 0.9 % injection 3 mL, 3 mL, Intravenous, Q12H, Erin R Barrett, PA-C, 3 mL at 02/20/15 1113 .  sodium chloride 0.9 % injection 3 mL, 3 mL, Intravenous, PRN, Erin R Barrett, PA-C .  traMADol (ULTRAM) tablet 50 mg, 50 mg, Oral, Q4H PRN, [name], MD, 50 mg at 02/19/15 1551  Patients Current Diet: Diet full liquid Room service appropriate?: Yes; Fluid consistency:: Thin  Precautions / Restrictions Precautions Precautions: Fall, Sternal Restrictions Weight Bearing Restrictions: Yes   Has the patient had 2 or more falls or a fall with injury in the past year?Yes; pt. Reports 11 falls in the past approximately year; most with scrapes and bruising ;one with fibular fx.  Prior Activity Level Community  (5-7x/wk): Pt. reports he was out of the house most days, running errands, Arts administrator / Equipment Home Assistive Devices/Equipment:  (pt. reports he does not use his Cpap) Home Equipment: Environmental consultant - 4 wheels, Walker - 2 wheels, [place] - quad, [place] - single point  Prior Device Use: Indicate devices/aids used by the patient prior to current illness, exacerbation or injury? Walker.  Pt. Reports he has cane and RW but was not using them much PTA  Prior Functional Level Prior Function Level of Independence: Independent Comments: Pt reports he was amb without assistive device. History of frequent falls.  Self Care: Did the patient need help bathing, dressing, using the toilet or eating?  Independent  Indoor Mobility: Did the patient need assistance with walking from room to room (with or without device)?   Stairs: Did the patient need assistance with internal or external stairs (with or without device)? Independent; pt. says it had become increasingly difficult to get up the steps with assist.    Functional Cognition: Did the patient need help planning regular tasks such as shopping or remembering to take medications? Needed some help.  Pt. States he was filling his and his wife's med boxes but that he would occasionally forget to take his meds.   Current Functional Level Cognition  Overall Cognitive Status: Within Functional Limits for tasks assessed Current Attention Level: Sustained Orientation Level: Oriented X4 Safety/Judgement: Decreased awareness of safety, Decreased awareness of deficits General Comments: required vc for sequencing with transfers to maintain sternal precautions    Extremity Assessment (includes Sensation/Coordination)  Upper Extremity Assessment: Generalized weakness  Lower Extremity Assessment: Generalized weakness    ADLs       Mobility  Overal bed mobility: Needs Assistance Bed Mobility: Rolling, Sidelying to Sit Rolling: Min  assist Sidelying to sit: Mod assist Supine to sit: +2 for physical assistance, Max assist Sit to sidelying: Min assist, +2 for physical assistance, +2 for safety/equipment General bed mobility comments: vc for technique, assist due to inability to use UEs and weakness    Transfers  Overall transfer level: Needs assistance Equipment used: Rolling walker (2 wheeled) Transfers: Sit to/from Stand Sit to Stand: Min assist Stand pivot transfers: +2 physical assistance, Mod assist General transfer comment: vc for sternal precautions; Assist to bring hips up and for balance.    Ambulation / Gait / Stairs / Wheelchair Mobility  Ambulation/Gait Ambulation/Gait assistance: Min assist, Min guard Ambulation Distance (Feet): 200 Feet Assistive device: Rolling walker (2 wheeled) General Gait Details: Pt required 3 seated rest breaks due to fatigue and weakness. States that he feels his legs are weak and they will give out. Knees appeared to be buckling at times. Occasional min assist required for safety.  Gait Pattern/deviations: Step-through pattern, Decreased stride length, Trunk flexed Gait velocity: Decreased Gait velocity interpretation: Below normal speed for age/gender    Posture / Balance Balance Overall balance assessment: History of Falls, Needs assistance Sitting-balance support: Feet supported, No upper extremity supported Sitting balance-Leahy Scale: Fair Standing balance support: Bilateral upper extremity supported Standing balance-Leahy Scale: Poor    Special needs/care consideration BiPAP/CPAP  Has Cpap but does not use it Continuous Drip IV  no         Oxygen no      Skin  Has bruising and ecchymosis of lower legs                              Bowel mgmt:  Last BM 02/20/15, continent Bladder mgmt: using urinal, continent but occasional spillage due to difficulty managing  Diabetic mgmt  no     Previous Home Environment Living Arrangements: Spouse/significant other Available  Help at Discharge: Family (wife unable to physically assist pt) Type of Home: House Home Layout: Multi-level Alternate Level Stairs-Rails: Left Alternate Level Stairs-Number of Steps: 13 Home Access: Stairs to enter Entrance Stairs-Rails: None Entrance Stairs-Number of Steps: 4 Home Care Services: No  Discharge Living Setting Plans for Discharge Living Setting: Patient's home Type of Home at Discharge: House (town home) Discharge Home Layout: Two level, Able to live on main level with bedroom/bathroom Discharge Home Access: Level entry Discharge Bathroom Shower/Tub: Walk-in shower Discharge Bathroom Toilet: Standard Discharge Bathroom Accessibility: Yes How Accessible: Accessible via walker Does the patient have any problems obtaining your medications?: No  Social/Family/Support Systems Patient Roles: Spouse, Parent Anticipated Caregiver: wife will provide 24 hour supervision but cannot physically assist Ability/Limitations of Caregiver: wife can't physically assist Caregiver Availability: 24/7 Discharge Plan Discussed with Primary Caregiver: Yes (discussed with son, [name] [name]) Is Caregiver In Agreement with Plan?: Yes Does Caregiver/Family have Issues with Lodging/Transportation while Pt is in Rehab?: No   Goals/Additional Needs Patient/Family Goal for Rehab: modified independent for PT/OT Expected length of stay: 7 days Cultural Considerations: pt. reports he is [employer] Dietary Needs: full liquid , thin diet Equipment Needs: TBD Additional Information: [name] [name], son, works here at [facility] Pt/Family Agrees to Admission and willing to participate: Yes Program Orientation Provided & Reviewed with Pt/Caregiver Including Roles  &  Responsibilities: Yes   Decrease burden of Care through IP rehab admission: no   Possible need for SNF placement upon discharge:  Not anticipated   Patient Condition: This patient's medical and functional status has changed since the  consult dated: 02/18/15  in which the Rehabilitation Physician determined and documented that the patient's condition is appropriate for intensive rehabilitative care in an inpatient rehabilitation facility. See "History of Present Illness" (above) for medical update. Functional changes are:  Mod assist bed mobility, min assist transfers and min to min guard assist for ambulation. Fatigues easily, legs weak, knees buckling Patient's medical and functional status update has been discussed with the Rehabilitation physician and patient remains appropriate for inpatient rehabilitation. Will admit to inpatient rehab today.  Preadmission Screen Completed By:  Weldon Picking, 02/20/2015 4:36 PM ______________________________________________________________________   Discussed status with Dr. Wynn Banker on 02/20/15 at  1636  and received telephone approval for admission today.  Admission Coordinator:  Weldon Picking, time 1610 Dorna Bloom 02/20/15

## 2015-02-20 NOTE — Care Management Note (Signed)
Case Management Note Donn Pierini RN, BSN Unit 2W-Case Manager [phone]  Patient Details  Name: [name] [name] MRN: [id number] Date of Birth: 1932/03/09  Subjective/Objective:     Pt admitted s/p AVR               Action/Plan: PTA pt lived at home with spouse- uses cane/walker- plan is to d/c to SNF- CSW following for d/c needs  Expected Discharge Date:      02/20/15            Expected Discharge Plan:  Skilled Nursing Facility  In-House Referral:  Clinical Social Work  Discharge planning Services  CM Consult  Post Acute Care Choice:    Choice offered to:     DME Arranged:    DME Agency:     HH Arranged:    HH Agency:     Status of Service:  Completed, signed off  Medicare Important Message Given:  Yes Date Medicare IM Given:  02/17/15 Medicare IM give by:  Donn Pierini RN, BSN  Date Additional Medicare IM Given:  02/20/15 Additional Medicare Important Message give by:  Donn Pierini RN, BSN   If discussed at Long Length of Stay Meetings, dates discussed:  02/18/15  Additional Comments:  02/20/15- pt has a bed for CIR today- plan to d/c to CIR later today.   Darrold Span, RN 02/20/2015, 2:21 PM

## 2015-02-20 NOTE — Progress Notes (Signed)
I spoke with Jerline Pain, PA and then contacted Dr. Tyrone Sage in surgery . Pt can be discharged to inpt rehab today. I contacted pt's son, Cleone Slim, by phone and he is in agreement. We will make the arrangements for today. [phone]

## 2015-02-20 NOTE — Progress Notes (Signed)
Inpatient rehab is able to accept patient today.  CSW signing off.  Merlyn Lot, LCSWA Clinical Social Worker [phone]

## 2015-02-20 NOTE — Progress Notes (Signed)
CARDIAC REHAB PHASE I   PRE:  Rate/Rhythm: 84 afib    BP: sitting 155/100    SaO2: 100 RA  MODE:  Ambulation: 350 ft   POST:  Rate/Rhythm: 103 afib    BP: sitting 136/97     SaO2: 97 RA  Pt feeling better this am, eager to walk. Had to sit x2 due to weak legs with distance, specifically right inner hamstring, near incision. To recliner after walk, BP elevated. Will f/u. [phone]   Elissa Lovett Mertens CES, ACSM 02/20/2015 9:10 AM

## 2015-02-20 NOTE — Progress Notes (Signed)
[facility] Admission Coordinator Signed Physical Medicine and Rehabilitation PMR Pre-admission 02/20/2015 4:09 PM  Related encounter: Admission (Current) from 02/11/2015 in [facility] 2W CARDIAC UNIT    Expand All Collapse All   PMR Admission Coordinator Pre-Admission Assessment  Patient: [name] [name] is an 81 y.o., male MRN: [id number] DOB: 1931/12/28 Height: 6' (182.9 cm) Weight: 114 kg (251 lb 5.2 oz)  Insurance Information HMO: PPO: PCP: IPA: 80/20: OTHER:  PRIMARY: Medicare A and B Policy#: [id number] Subscriber: self CM Name: Phone#: Fax#:  Pre-Cert#: Employer: retired Benefits: Phone #: Name:  Eff. Date: 09/30/1998 Deduct: $1288 Out of Pocket Max: no Life Max: none CIR: 100% SNF: 100% first 20 days Outpatient: 80% Co-Pay: 20% Home Health: 100% Co-Pay:  DME: 80% Co-Pay: 20% Providers: Pt.'s choice SECONDARY: BCBS State Health Policy#: [id number] Subscriber: self CM Name: Phone#: Fax#:  Pre-Cert#: Employer:  Benefits: Phone #: [phone] Name:  Eff. Date: Deduct: Out of Pocket Max: Life Max:  CIR: SNF:  Outpatient: Co-Pay:  Home Health: Co-Pay:  DME: Co-Pay:   Emergency Contact Information Contact Information    Name Relation Home Work Mobile   Scafidi,[name] Son   [phone]   Coba,[name] Spouse [phone]       Current Medical History  Patient Admitting Diagnosis: debility related to AS and subsequent AVR History of Present Illness: [name] [name] is a 81 y.o. right handed male with history of atrial fibrillation maintained onEliquis, chronic renal insufficiency with  baseline creatinine 1.69, Traumatic brain injury with craniotomy 1960. Presented 02/11/2015 with history of severe aortic insufficiency and has been followed at [facility] in the past. Patient lives with his wife using a walker and cane at times prior to admission with history of frequent falls. Patient with recent nursing home placement February 2016 after right proximal fibula fracture after a fall. Evaluation by cardiothoracic surgery for aortic valve insufficiency and underwent aortic valve replacement 02/17/2015 per Dr. Tyrone Sage. Hospital course pain management. Eliquis resumed postoperatively. Contact precautions for MRSA PCR screen positive. Bouts of nausea with history of pancreatitis lipase level 82 and diet slowly advanced. Ultrasound gallbladder showed mild fatty infiltration of liver otherwise unremarkable. Physical therapy ongoing and easily fatigued. M.D. has requested physical medicine rehabilitation consult. Patient was admitted for comprehensive rehabilitation program      Past Medical History  Past Medical History  Diagnosis Date  . Atrial fibrillation   . Hypothyroidism   . Plantar fasciitis   . Seasonal allergies   . Decreased testosterone level   . Varicose veins   . Hypertension   . Hypercholesteremia   . Aortic valve insufficiency   . Depression   . Heart murmur   . DVT (deep venous thrombosis) ~ 2013    "BLE"  . OSA (obstructive sleep apnea)     "suppose to wear mask; I throw it off in my sleep" (10/15/2014)  . COPD (chronic obstructive pulmonary disease)   . Emphysema of lung   . History of blood transfusion 1960    "lots; related to an accident"  . Arthritis     "wee bit; knees, elbows" (10/15/2014)  . Anxiety   . Falls frequently     > 20 times in the last year/notes 10/15/2014  . Syncope and collapse "several times"  . Complication of anesthesia     unsure of  complications but pt. states there were complications  . Dysrhythmia     A. Fib  . Coronary artery disease   . Shortness  of breath dyspnea   . Chronic kidney disease     "down to ~ 1/2 of their regular use; I see kidney dr. in [place]" (10/15/2014)  . Urine frequency   . Urine incontinence   . Enlarged prostate   . GERD (gastroesophageal reflux disease)   . Difficult intubation     difficult intubation 12/03/09 and 03/27/10 ([facility]); glidescope used 03/27/10    Family History  family history includes Cancer in his sister; Heart disease in his father; Rheum arthritis in his father and mother; Stroke in his sister.  Prior Rehab/Hospitalizations:  Has the patient had major surgery during 100 days prior to admission? No; he has had 2 SNF stays at [facility] prior to this admission  Current Medications   Current facility-administered medications:  . 0.45 % sodium chloride infusion, , Intravenous, Continuous PRN, Erin R Barrett, PA-C, Last Rate: 20 mL/hr at 02/12/15 0800 . 0.9 % sodium chloride infusion, 250 mL, Intravenous, Continuous, Erin R Barrett, PA-C, Last Rate: 1 mL/hr at 02/12/15 0511, 250 mL at 02/12/15 0511 . 0.9 % sodium chloride infusion, , Intravenous, Continuous, Erin R Barrett, PA-C, Last Rate: 20 mL/hr at 02/12/15 0012, 20 mL at 02/12/15 0012 . albuterol (PROVENTIL) (2.5 MG/3ML) 0.083% nebulizer solution 2.5 mg, 2.5 mg, Nebulization, Q6H PRN, [name], MD . amLODipine (NORVASC) tablet 5 mg, 5 mg, Oral, Daily, Donielle M Zimmerman, PA-C, 5 mg at 02/20/15 1119 . apixaban (ELIQUIS) tablet 2.5 mg, 2.5 mg, Oral, BID, Donielle M Zimmerman, PA-C, 2.5 mg at 02/20/15 1113 . aspirin EC tablet 81 mg, 81 mg, Oral, Daily, 81 mg at 02/20/15 1115 **OR** aspirin chewable tablet 81 mg, 81 mg, Per Tube, Daily, [name], MD . atorvastatin (LIPITOR) tablet 10 mg, 10 mg, Oral, Daily, Erin R Barrett, PA-C, 10 mg at 02/20/15  1114 . bisacodyl (DULCOLAX) EC tablet 10 mg, 10 mg, Oral, Daily, 10 mg at 02/19/15 1017 **OR** bisacodyl (DULCOLAX) suppository 10 mg, 10 mg, Rectal, Daily, Erin R Barrett, PA-C . buPROPion (WELLBUTRIN XL) 24 hr tablet 300 mg, 300 mg, Oral, Daily, Erin R Barrett, PA-C, 300 mg at 02/20/15 1115 . busPIRone (BUSPAR) tablet 15 mg, 15 mg, Oral, BID, Erin R Barrett, PA-C, 15 mg at 02/20/15 1117 . clonazePAM (KLONOPIN) tablet 0.5 mg, 0.5 mg, Oral, QHS, Erin R Barrett, PA-C, 0.5 mg at 02/19/15 2144 . diltiazem (CARDIZEM SR) 12 hr capsule 60 mg, 60 mg, Oral, Q12H, [name], MD, 60 mg at 02/20/15 1118 . docusate sodium (COLACE) capsule 200 mg, 200 mg, Oral, Daily, Erin R Barrett, PA-C, 200 mg at 02/20/15 1115 . dutasteride (AVODART) capsule 0.5 mg, 0.5 mg, Oral, Daily, Erin R Barrett, PA-C, 0.5 mg at 02/20/15 1118 . escitalopram (LEXAPRO) tablet 20 mg, 20 mg, Oral, Daily, Erin R Barrett, PA-C, 20 mg at 02/20/15 1116 . fluticasone (FLONASE) 50 MCG/ACT nasal spray 1 spray, 1 spray, Each Nare, Daily, Erin R Barrett, PA-C, 1 spray at 02/20/15 1117 . insulin aspart (novoLOG) injection 0-15 Units, 0-15 Units, Subcutaneous, TID WC, Donielle Margaretann Loveless, PA-C, 0 Units at 02/20/15 1130 . lamoTRIgine (LAMICTAL) tablet 100 mg, 100 mg, Oral, BID, Erin R Barrett, PA-C, 100 mg at 02/20/15 1114 . levothyroxine (SYNTHROID, LEVOTHROID) tablet 150 mcg, 150 mcg, Oral, QAC breakfast, Erin R Barrett, PA-C, 150 mcg at 02/20/15 0620 . ondansetron (ZOFRAN) injection 4 mg, 4 mg, Intravenous, Q6H PRN, Erin R Barrett, PA-C . pantoprazole (PROTONIX) EC tablet 40 mg, 40 mg, Oral, Daily, Erin R Barrett, PA-C, 40 mg at  02/19/15 1017 . potassium chloride SA (K-DUR,KLOR-CON) CR tablet 20 mEq, 20 mEq, Oral, Q4H PRN, [name], MD . sodium chloride 0.9 % injection 3 mL, 3 mL, Intravenous, Q12H, Erin R Barrett, PA-C, 3 mL at 02/20/15 1113 . sodium chloride 0.9 % injection 3 mL, 3 mL, Intravenous, PRN, Erin R  Barrett, PA-C . traMADol (ULTRAM) tablet 50 mg, 50 mg, Oral, Q4H PRN, [name], MD, 50 mg at 02/19/15 1551  Patients Current Diet: Diet full liquid Room service appropriate?: Yes; Fluid consistency:: Thin  Precautions / Restrictions Precautions Precautions: Fall, Sternal Restrictions Weight Bearing Restrictions: Yes   Has the patient had 2 or more falls or a fall with injury in the past year?Yes; pt. Reports 11 falls in the past approximately year; most with scrapes and bruising ;one with fibular fx.  Prior Activity Level Community (5-7x/wk): Pt. reports he was out of the house most days, running errands, Arts administrator / Equipment Home Assistive Devices/Equipment: (pt. reports he does not use his Cpap) Home Equipment: Environmental consultant - 4 wheels, Walker - 2 wheels, [place] - quad, [place] - single point  Prior Device Use: Indicate devices/aids used by the patient prior to current illness, exacerbation or injury? Walker. Pt. Reports he has cane and RW but was not using them much PTA  Prior Functional Level Prior Function Level of Independence: Independent Comments: Pt reports he was amb without assistive device. History of frequent falls.  Self Care: Did the patient need help bathing, dressing, using the toilet or eating? Independent  Indoor Mobility: Did the patient need assistance with walking from room to room (with or without device)?   Stairs: Did the patient need assistance with internal or external stairs (with or without device)? Independent; pt. says it had become increasingly difficult to get up the steps with assist.   Functional Cognition: Did the patient need help planning regular tasks such as shopping or remembering to take medications? Needed some help. Pt. States he was filling his and his wife's med boxes but that he would occasionally forget to take his meds.  Current Functional Level Cognition  Overall Cognitive Status: Within  Functional Limits for tasks assessed Current Attention Level: Sustained Orientation Level: Oriented X4 Safety/Judgement: Decreased awareness of safety, Decreased awareness of deficits General Comments: required vc for sequencing with transfers to maintain sternal precautions   Extremity Assessment (includes Sensation/Coordination)  Upper Extremity Assessment: Generalized weakness  Lower Extremity Assessment: Generalized weakness    ADLs       Mobility  Overal bed mobility: Needs Assistance Bed Mobility: Rolling, Sidelying to Sit Rolling: Min assist Sidelying to sit: Mod assist Supine to sit: +2 for physical assistance, Max assist Sit to sidelying: Min assist, +2 for physical assistance, +2 for safety/equipment General bed mobility comments: vc for technique, assist due to inability to use UEs and weakness    Transfers  Overall transfer level: Needs assistance Equipment used: Rolling walker (2 wheeled) Transfers: Sit to/from Stand Sit to Stand: Min assist Stand pivot transfers: +2 physical assistance, Mod assist General transfer comment: vc for sternal precautions; Assist to bring hips up and for balance.    Ambulation / Gait / Stairs / Wheelchair Mobility  Ambulation/Gait Ambulation/Gait assistance: Min assist, Min guard Ambulation Distance (Feet): 200 Feet Assistive device: Rolling walker (2 wheeled) General Gait Details: Pt required 3 seated rest breaks due to fatigue and weakness. States that he feels his legs are weak and they will give out. Knees appeared  to be buckling at times. Occasional min assist required for safety.  Gait Pattern/deviations: Step-through pattern, Decreased stride length, Trunk flexed Gait velocity: Decreased Gait velocity interpretation: Below normal speed for age/gender    Posture / Balance Balance Overall balance assessment: History of Falls, Needs assistance Sitting-balance support: Feet supported, No upper extremity  supported Sitting balance-Leahy Scale: Fair Standing balance support: Bilateral upper extremity supported Standing balance-Leahy Scale: Poor    Special needs/care consideration BiPAP/CPAP Has Cpap but does not use it Continuous Drip IV no  Oxygen no  Skin Has bruising and ecchymosis of lower legs  Bowel mgmt: Last BM 02/20/15, continent Bladder mgmt: using urinal, continent but occasional spillage due to difficulty managing  Diabetic mgmt no     Previous Home Environment Living Arrangements: Spouse/significant other Available Help at Discharge: Family (wife unable to physically assist pt) Type of Home: House Home Layout: Multi-level Alternate Level Stairs-Rails: Left Alternate Level Stairs-Number of Steps: 13 Home Access: Stairs to enter Entrance Stairs-Rails: None Entrance Stairs-Number of Steps: 4 Home Care Services: No  Discharge Living Setting Plans for Discharge Living Setting: Patient's home Type of Home at Discharge: House (town home) Discharge Home Layout: Two level, Able to live on main level with bedroom/bathroom Discharge Home Access: Level entry Discharge Bathroom Shower/Tub: Walk-in shower Discharge Bathroom Toilet: Standard Discharge Bathroom Accessibility: Yes How Accessible: Accessible via walker Does the patient have any problems obtaining your medications?: No  Social/Family/Support Systems Patient Roles: Spouse, Parent Anticipated Caregiver: wife will provide 24 hour supervision but cannot physically assist Ability/Limitations of Caregiver: wife can't physically assist Caregiver Availability: 24/7 Discharge Plan Discussed with Primary Caregiver: Yes (discussed with son, Morrissey Lenhart) Is Caregiver In Agreement with Plan?: Yes Does Caregiver/Family have Issues with Lodging/Transportation while Pt is in Rehab?: No   Goals/Additional Needs Patient/Family Goal for Rehab: modified independent for  PT/OT Expected length of stay: 7 days Cultural Considerations: pt. reports he is [employer] Dietary Needs: full liquid , thin diet Equipment Needs: TBD Additional Information: Johnette Abraham, son, works here at [employer] hospital Pt/Family Agrees to Admission and willing to participate: Yes Program Orientation Provided & Reviewed with Pt/Caregiver Including Roles & Responsibilities: Yes   Decrease burden of Care through IP rehab admission: no   Possible need for SNF placement upon discharge: Not anticipated   Patient Condition: This patient's medical and functional status has changed since the consult dated: 02/18/15 in which the Rehabilitation Physician determined and documented that the patient's condition is appropriate for intensive rehabilitative care in an inpatient rehabilitation facility. See "History of Present Illness" (above) for medical update. Functional changes are: Mod assist bed mobility, min assist transfers and min to min guard assist for ambulation. Fatigues easily, legs weak, knees buckling Patient's medical and functional status update has been discussed with the Rehabilitation physician and patient remains appropriate for inpatient rehabilitation. Will admit to inpatient rehab today.  Preadmission Screen Completed By: Weldon Picking, 02/20/2015 4:36 PM ______________________________________________________________________  Discussed status with Dr. Wynn Banker on 02/20/15 at 1636 and received telephone approval for admission today.  Admission Coordinator: Weldon Picking, time 8403 /Date 02/20/15          Cosigned by: Erick Colace, MD at 02/20/2015 4:40 PM

## 2015-02-20 NOTE — Progress Notes (Addendum)
      [address]       [place] [address]             [phone]        9 Days Post-Op Procedure(s) (LRB): AORTIC VALVE REPLACEMENT (AVR) (N/A) CLIPPING OF ATRIAL APPENDAGE (N/A) TRANSESOPHAGEAL ECHOCARDIOGRAM (TEE) (N/A)  Subjective: Patient's only complaint is feeling tired.   Objective: Vital signs in last 24 hours: Temp:  [97.6 F (36.4 C)-98.3 F (36.8 C)] 97.7 F (36.5 C) (06/23 0417) Pulse Rate:  [64-93] 87 (06/23 0417) Cardiac Rhythm:  [-] Atrial fibrillation (06/22 2010) Resp:  [18-20] 18 (06/23 0417) BP: (122-158)/(86-100) 158/100 mmHg (06/23 0417) SpO2:  [93 %-97 %] 93 % (06/23 0417) Weight:  [251 lb 5.2 oz (114 kg)] 251 lb 5.2 oz (114 kg) (06/23 0431)  Pre op weight 116 kg Current Weight  02/20/15 251 lb 5.2 oz (114 kg)      Intake/Output from previous day: 06/22 0701 - 06/23 0700 In: 840 [P.O.:840] Out: 500 [Urine:500]   Physical Exam:  Cardiovascular: IRRR IRRR Pulmonary: Clear to auscultation bilaterally; no rales, wheezes, or rhonchi. Abdomen: Soft, non tender, hypoactive bowel sounds present. Extremities: No lower extremity edema. Wounds: Clean and dry.  No erythema or signs of infection.  Lab Results: CBC:  Recent Labs  02/18/15 1033  WBC 6.1  HGB 9.4*  HCT 29.3*  PLT 238   BMET:   Recent Labs  02/19/15 0407 02/20/15 0427  NA 140 138  K 4.1 3.9  CL 104 104  CO2 28 27  GLUCOSE 92 89  BUN 14 11  CREATININE 1.66* 1.54*  CALCIUM 8.8* 8.8*    PT/INR:  Lab Results  Component Value Date   INR 1.50* 02/11/2015   INR 1.23 02/07/2015   INR 1.17 06/20/2014   ABG:  INR: Will add last result for INR, ABG once components are confirmed Will add last 4 CBG results once components are confirmed  Assessment/Plan:  1. CV - . A fib with CVR-HR in the 80's. On Cardizem SR 60 mg bid. SBP mostly in 150'. Will restart Norvasc 5 mg daily.He was on Eliquis prior to surgery so will restart 2.  Pulmonary - History  of COPD. On room air. 3.  Acute blood loss anemia - H and H yesterday 9.4 and 29.3 4. History of CKD. Creatinine decreased from 1.66 to 1.54. Admitting creatinine was 2.03.  5. GI- No abdominal pain but with intermittent nausea. Zofran PRN nausea. Check Lipase in am. He has had pancreatitis in the past. On clear liquids. Per Dr. Tyrone Sage, will increase to full liquids. 6. Will discuss with Dr. Tyrone Sage if should decrease Klonopin as appears somewhat somnolent in the am 7. CIR soon  ZIMMERMAN,DONIELLE MPA-C 02/20/2015,7:50 AM

## 2015-02-20 NOTE — Progress Notes (Signed)
Utilization review completed.  

## 2015-02-20 NOTE — Progress Notes (Signed)
Ranelle Oyster, MD Physician Signed Physical Medicine and Rehabilitation Consult Note 02/18/2015 5:41 AM  Related encounter: Admission (Current) from 02/11/2015 in [facility] 2W CARDIAC UNIT    Expand All Collapse All        Physical Medicine and Rehabilitation Consult Reason for Consult: Debilitation after aortic valve replacement Referring Physician: Dr. Tyrone Sage   HPI: [name] [name] is a 81 y.o. right handed male with history of atrial fibrillation maintained onEliquis, chronic renal insufficiency with baseline creatinine 1.69, Traumatic brain injury with craniotomy 1960. Presented 02/11/2015 with history of severe aortic insufficiency and has been followed at [facility] in the past. Patient lives with his wife using a walker and cane at times prior to admission with history of frequent falls. Patient with recent nursing home placement February 2016 after right proximal fibula fracture after a fall. Evaluation by cardiothoracic surgery for aortic valve insufficiency and underwent aortic valve replacement 02/17/2015 per Dr. Tyrone Sage. Hospital course pain management. Lovenox for DVT prophylaxis. Presently maintained on aspirin therapy after aortic valve replacement question plan to resume Eliquis. Contact precautions for MRSA PCR screen positive. Physical therapy postoperatively is pending. M.D. has requested physical medicine rehabilitation consult.   Review of Systems  Constitutional: Negative for fever and chills.  HENT: Positive for hearing loss.  Respiratory:   Shortness of breath on exertion  Cardiovascular: Positive for palpitations and leg swelling.  Gastrointestinal: Positive for constipation.   GERD  Genitourinary:   Urinary incontinence  Musculoskeletal: Positive for myalgias, joint pain and falls.  Skin: Negative for rash.  Neurological: Positive for weakness. Negative for headaches.   Syncope  Psychiatric/Behavioral:  Positive for depression and memory loss.   Anxiety   Past Medical History  Diagnosis Date  . Atrial fibrillation   . Hypothyroidism   . Plantar fasciitis   . Seasonal allergies   . Decreased testosterone level   . Varicose veins   . Hypertension   . Hypercholesteremia   . Aortic valve insufficiency   . Depression   . Heart murmur   . DVT (deep venous thrombosis) ~ 2013    "BLE"  . OSA (obstructive sleep apnea)     "suppose to wear mask; I throw it off in my sleep" (10/15/2014)  . COPD (chronic obstructive pulmonary disease)   . Emphysema of lung   . History of blood transfusion 1960    "lots; related to an accident"  . Arthritis     "wee bit; knees, elbows" (10/15/2014)  . Anxiety   . Falls frequently     > 20 times in the last year/notes 10/15/2014  . Syncope and collapse "several times"  . Complication of anesthesia     unsure of complications but pt. states there were complications  . Dysrhythmia     A. Fib  . Coronary artery disease   . Shortness of breath dyspnea   . Chronic kidney disease     "down to ~ 1/2 of their regular use; I see kidney dr. in [place]" (10/15/2014)  . Urine frequency   . Urine incontinence   . Enlarged prostate   . GERD (gastroesophageal reflux disease)   . Difficult intubation     difficult intubation 12/03/09 and 03/27/10 ([facility]); glidescope used 03/27/10   Past Surgical History  Procedure Laterality Date  . Abdominal aortic aneurysm repair  01/2006; 03/10/2006    Hattie Perch 01/12/2011  . Tonsillectomy    . Hernia repair    . Laparoscopic incisional / umbilical /  ventral hernia repair  02/2010    VHR w/mesh/notes 03/28/2010  . Laparoscopic cholecystectomy  08/2009    w/IOC/notes 09/18/2009  . Ercp  11/2009    Hattie Perch 12/05/2009  . Gall stone    . Cardiac catheterization  1990's X  1; 09/2014    no stents  . Nose surgery    . Brain surgery  1960 X 4    "S/P got my skull busted"; pt. states removed internal carotid artery  . Aortic valve replacement N/A 02/11/2015    Procedure: AORTIC VALVE REPLACEMENT (AVR); Surgeon: [name], MD; Location: [facility] OR; Service: Open Heart Surgery; Laterality: N/A;  . Clipping of atrial appendage N/A 02/11/2015    Procedure: CLIPPING OF ATRIAL APPENDAGE; Surgeon: [name], MD; Location: [facility] OR; Service: Open Heart Surgery; Laterality: N/A;  . Tee without cardioversion N/A 02/11/2015    Procedure: TRANSESOPHAGEAL ECHOCARDIOGRAM (TEE); Surgeon: [name], MD; Location: [facility] OR; Service: Open Heart Surgery; Laterality: N/A;   Family History  Problem Relation Age of Onset  . Heart disease Father   . Rheum arthritis Mother   . Rheum arthritis Father   . Cancer Sister   . Stroke Sister    Social History:  reports that he quit smoking about 40 years ago. His smoking use included Cigarettes. He has a 44 pack-year smoking history. He has never used smokeless tobacco. He reports that he drinks alcohol. He reports that he does not use illicit drugs. Allergies: No Known Allergies Medications Prior to Admission  Medication Sig Dispense Refill  . amLODipine (NORVASC) 5 MG tablet Take 5 mg by mouth daily.    Marland Kitchen apixaban (ELIQUIS) 2.5 MG TABS tablet Take 1 tablet (2.5 mg total) by mouth 2 (two) times daily. 60 tablet 3  . buPROPion (WELLBUTRIN XL) 300 MG 24 hr tablet Take 300 mg by mouth daily.    . busPIRone (BUSPAR) 15 MG tablet Take 15 mg by mouth 2 (two) times daily.    . clonazePAM (KLONOPIN) 0.5 MG tablet Take 1 tablet (0.5 mg total) by mouth at bedtime as needed for anxiety. 30 tablet 0  . digoxin (LANOXIN) 0.125 MG tablet Take 0.125 mg by mouth daily.    Marland Kitchen diltiazem (TIAZAC) 120 MG 24 hr capsule Take 120 mg by mouth  daily.    Marland Kitchen dutasteride (AVODART) 0.5 MG capsule Take 0.5 mg by mouth daily.    Marland Kitchen escitalopram (LEXAPRO) 20 MG tablet Take 20 mg by mouth daily.    . Eszopiclone (ESZOPICLONE) 3 MG TABS Take 6 mg by mouth at bedtime. Take immediately before bedtime    . folic acid (FOLVITE) 1 MG tablet Take 1 mg by mouth daily.    Marland Kitchen lamoTRIgine (LAMICTAL) 100 MG tablet Take 100 mg by mouth 2 (two) times daily. Breakfast and dinner    . levothyroxine (SYNTHROID, LEVOTHROID) 150 MCG tablet Take 150 mcg by mouth daily before breakfast.    . lisinopril (PRINIVIL,ZESTRIL) 40 MG tablet Take 40 mg by mouth at bedtime.  3  . Melatonin 3 MG TABS Take 1 tablet by mouth at bedtime.    Marland Kitchen acetaminophen (TYLENOL) 325 MG tablet Take 2 tablets (650 mg total) by mouth every 6 (six) hours as needed for mild pain (or Fever >/= 101).    Marland Kitchen albuterol (PROVENTIL HFA;VENTOLIN HFA) 108 (90 BASE) MCG/ACT inhaler Inhale 2 puffs into the lungs every 6 (six) hours as needed for wheezing or shortness of breath.    Marland Kitchen atorvastatin (LIPITOR) 10 MG  tablet Take 10 mg by mouth daily.  0  . calcium carbonate (TUMS - DOSED IN MG ELEMENTAL CALCIUM) 500 MG chewable tablet Chew 1 tablet by mouth as needed for indigestion or heartburn.    . fluticasone (FLONASE) 50 MCG/ACT nasal spray Place 1 spray into both nostrils daily. (Patient taking differently: Place 1 spray into both nostrils daily as needed for allergies. ) 9.9 g 2    Home: Home Living Family/patient expects to be discharged to:: Skilled nursing facility Living Arrangements: Spouse/significant other Available Help at Discharge: Family (wife unable to physically assist pt) Type of Home: House Home Access: Stairs to enter Secretary/administrator of Steps: 4 Entrance Stairs-Rails: None Home Layout: Multi-level Alternate Level Stairs-Number of Steps: 13 Alternate Level Stairs-Rails: Left Home Equipment: Environmental consultant - 4 wheels,  Walker - 2 wheels, [place] - quad, [place] - single point  Functional History: Prior Function Level of Independence: Independent Comments: Pt reports he was amb without assistive device. History of frequent falls. Functional Status:  Mobility: Bed Mobility Overal bed mobility: Needs Assistance Bed Mobility: Rolling, Sidelying to Sit Rolling: Min assist Sidelying to sit: Mod assist Supine to sit: +2 for physical assistance, Max assist Sit to sidelying: Min assist, +2 for physical assistance, +2 for safety/equipment General bed mobility comments: vc for technique, assist due to inability to use UEs and weakness Transfers Overall transfer level: Needs assistance Equipment used: Rolling walker (2 wheeled) Transfers: Sit to/from Stand Sit to Stand: Min assist Stand pivot transfers: +2 physical assistance, Mod assist General transfer comment: vc for sternal precautions; Assist to bring hips up and for balance. Ambulation/Gait Ambulation/Gait assistance: Min assist Ambulation Distance (Feet): 100 Feet Assistive device: Rolling walker (2 wheeled) General Gait Details: good cadence and step length; vc for proper use of RW Gait Pattern/deviations: Step-through pattern, Trunk flexed Gait velocity: almost too fast with RW getting too far ahead at times; pt becomes dyspneic Gait velocity interpretation: at or above normal speed for age/gender    ADL:    Cognition: Cognition Overall Cognitive Status: No family/caregiver present to determine baseline cognitive functioning Orientation Level: Oriented X4 Cognition Arousal/Alertness: Awake/alert Behavior During Therapy: Impulsive Overall Cognitive Status: No family/caregiver present to determine baseline cognitive functioning Area of Impairment: Safety/judgement, Problem solving, Attention Current Attention Level: Sustained Memory: (able to grossly recall precautions) Safety/Judgement: Decreased awareness of safety, Decreased awareness of  deficits Problem Solving: Requires verbal cues General Comments: required vc for sequencing with transfers to maintain sternal precautions  Blood pressure 148/104, pulse 86, temperature 98.2 F (36.8 C), temperature source Oral, resp. rate 18, height 6' (1.829 m), weight 112.265 kg (247 lb 8 oz), SpO2 94 %. Physical Exam  HENT:  Head: Normocephalic.  Eyes: EOM are normal.  Neck: Normal range of motion. Neck supple. No thyromegaly present.  Cardiovascular:  Cardiac rate controlled  Respiratory: Effort normal and breath sounds normal. No respiratory distress.  GI: Soft. Bowel sounds are normal. He exhibits no distension.  Neurological: He is alert.  Oriented to person, place and age. Fair awareness of deficits. Follows simple commands. 4/5 ue's and 3-4/5 prox to distal in LE's.  Skin:  Wounds clean, well approximated.  Psychiatric: He has a normal mood and affect. His behavior is normal.     Lab Results Last 24 Hours    Results for orders placed or performed during the hospital encounter of 02/11/15 (from the past 24 hour(s))  Glucose, capillary Status: Abnormal   Collection Time: 02/17/15 7:03 AM  Result Value Ref Range  Glucose-Capillary 105 (H) 65 - 99 mg/dL  Glucose, capillary Status: None   Collection Time: 02/17/15 12:13 PM  Result Value Ref Range   Glucose-Capillary 98 65 - 99 mg/dL  Glucose, capillary Status: None   Collection Time: 02/17/15 4:28 PM  Result Value Ref Range   Glucose-Capillary 98 65 - 99 mg/dL   Comment 1 Notify RN   Glucose, capillary Status: Abnormal   Collection Time: 02/17/15 9:53 PM  Result Value Ref Range   Glucose-Capillary 100 (H) 65 - 99 mg/dL      Imaging Results (Last 48 hours)    Dg Chest 2 View  02/16/2015 CLINICAL DATA: Short of breath EXAM: CHEST 2 VIEW COMPARISON: 02/13/2015 FINDINGS: Central venous catheter tip has been removed. Improved lung volumes with  decrease in bibasilar atelectasis. Small bilateral effusions remain. No edema. Prior heart surgery. IMPRESSION: Improved aeration of the lungs. Decrease in bibasilar atelectasis. Small bilateral effusions remain. Electronically Signed By: Marlan Palau M.D. On: 02/16/2015 08:23     Assessment/Plan: Diagnosis: debility related to AS and subsequent AVR 1. Does the need for close, 24 hr/day medical supervision in concert with the patient's rehab needs make it unreasonable for this patient to be served in a less intensive setting? Yes 2. Co-Morbidities requiring supervision/potential complications: CAD, afib,  3. Due to bladder management, bowel management, safety, skin/wound care, disease management, medication administration, pain management and patient education, does the patient require 24 hr/day rehab nursing? Yes 4. Does the patient require coordinated care of a physician, rehab nurse, PT (1-2 hrs/day, 5 days/week) and OT (1-2 hrs/day, 5 days/week) to address physical and functional deficits in the context of the above medical diagnosis(es)? Yes Addressing deficits in the following areas: balance, endurance, locomotion, strength, transferring, bowel/bladder control, bathing, dressing, feeding, grooming, toileting and psychosocial support 5. Can the patient actively participate in an intensive therapy program of at least 3 hrs of therapy per day at least 5 days per week? Yes 6. The potential for patient to make measurable gains while on inpatient rehab is excellent 7. Anticipated functional outcomes upon discharge from inpatient rehab are modified independent with PT, modified independent with OT, n/a with SLP. 8. Estimated rehab length of stay to reach the above functional goals is: 7 days 9. Does the patient have adequate social supports and living environment to accommodate these discharge functional goals? Yes 10. Anticipated D/C setting: Home 11. Anticipated post D/C treatments: HH  therapy 12. Overall Rehab/Functional Prognosis: excellent  RECOMMENDATIONS: This patient's condition is appropriate for continued rehabilitative care in the following setting: CIR Patient has agreed to participate in recommended program. Yes Note that insurance prior authorization may be required for reimbursement for recommended care.  Comment: Rehab Admissions Coordinator to follow up.  Thanks,  Ranelle Oyster, MD, [place]     02/18/2015       Revision History     Date/Time User Provider Type Action   02/18/2015 2:04 PM Ranelle Oyster, MD Physician Sign   02/18/2015 6:24 AM Charlton Amor, PA-C Physician Assistant Pend   View Details Report       Routing History     Date/Time From To Method   02/18/2015 2:04 PM Ranelle Oyster, MD Ranelle Oyster, MD In Basket   02/18/2015 2:04 PM Ranelle Oyster, MD Cain Saupe, MD Fax

## 2015-02-21 ENCOUNTER — Inpatient Hospital Stay (HOSPITAL_COMMUNITY): Payer: Medicare Other

## 2015-02-21 ENCOUNTER — Inpatient Hospital Stay (HOSPITAL_COMMUNITY): Payer: Medicare Other | Admitting: Physical Therapy

## 2015-02-21 LAB — CBC WITH DIFFERENTIAL/PLATELET
BASOS PCT: 0 % (ref 0–1)
Basophils Absolute: 0 10*3/uL (ref 0.0–0.1)
EOS ABS: 0.2 10*3/uL (ref 0.0–0.7)
EOS PCT: 3 % (ref 0–5)
HCT: 30.5 % — ABNORMAL LOW (ref 39.0–52.0)
HEMOGLOBIN: 9.9 g/dL — AB (ref 13.0–17.0)
LYMPHS ABS: 2.1 10*3/uL (ref 0.7–4.0)
Lymphocytes Relative: 33 % (ref 12–46)
MCH: 31.1 pg (ref 26.0–34.0)
MCHC: 32.5 g/dL (ref 30.0–36.0)
MCV: 95.9 fL (ref 78.0–100.0)
Monocytes Absolute: 0.4 10*3/uL (ref 0.1–1.0)
Monocytes Relative: 6 % (ref 3–12)
NEUTROS ABS: 3.8 10*3/uL (ref 1.7–7.7)
Neutrophils Relative %: 58 % (ref 43–77)
Platelets: 311 10*3/uL (ref 150–400)
RBC: 3.18 MIL/uL — ABNORMAL LOW (ref 4.22–5.81)
RDW: 16.2 % — AB (ref 11.5–15.5)
WBC: 6.4 10*3/uL (ref 4.0–10.5)

## 2015-02-21 LAB — COMPREHENSIVE METABOLIC PANEL
ALBUMIN: 3.2 g/dL — AB (ref 3.5–5.0)
ALT: 37 U/L (ref 17–63)
AST: 32 U/L (ref 15–41)
Alkaline Phosphatase: 130 U/L — ABNORMAL HIGH (ref 38–126)
Anion gap: 11 (ref 5–15)
BUN: 10 mg/dL (ref 6–20)
CALCIUM: 9.5 mg/dL (ref 8.9–10.3)
CO2: 28 mmol/L (ref 22–32)
Chloride: 101 mmol/L (ref 101–111)
Creatinine, Ser: 1.66 mg/dL — ABNORMAL HIGH (ref 0.61–1.24)
GFR calc Af Amer: 43 mL/min — ABNORMAL LOW (ref >60)
GFR calc non Af Amer: 37 mL/min — ABNORMAL LOW (ref >60)
Glucose, Bld: 97 mg/dL (ref 65–99)
Potassium: 4.4 mmol/L (ref 3.5–5.1)
SODIUM: 140 mmol/L (ref 135–145)
TOTAL PROTEIN: 6.5 g/dL (ref 6.5–8.1)
Total Bilirubin: 1 mg/dL (ref 0.3–1.2)

## 2015-02-21 LAB — URINALYSIS, ROUTINE W REFLEX MICROSCOPIC
Bilirubin Urine: NEGATIVE
GLUCOSE, UA: NEGATIVE mg/dL
Hgb urine dipstick: NEGATIVE
KETONES UR: NEGATIVE mg/dL
LEUKOCYTES UA: NEGATIVE
Nitrite: NEGATIVE
PH: 7 (ref 5.0–8.0)
PROTEIN: NEGATIVE mg/dL
Specific Gravity, Urine: 1.008 (ref 1.005–1.030)
Urobilinogen, UA: 2 mg/dL — ABNORMAL HIGH (ref 0.0–1.0)

## 2015-02-21 NOTE — Progress Notes (Signed)
Social Work Assessment and Plan  Patient Details  Name: [name] [name] MRN: [id number] Date of Birth: [birth year]  Today's Date: 02/21/2015  Problem List:  Patient Active Problem List   Diagnosis Date Noted  . Debilitated 02/20/2015  . S/P AVR 02/11/2015  . Ataxia 12/05/2014  . Abnormality of gait 12/05/2014  . Neck pain, chronic 12/05/2014  . Brisk deep tendon reflexes 12/05/2014  . Abnormal carotid duplex scan 10/28/2014  . OSA (obstructive sleep apnea) 10/28/2014  . Mood disorder with psychosis 10/28/2014  . Episodes of formed visual hallucinations 10/16/2014  . Faintness   . Syncope 10/15/2014  . Acute on chronic renal failure 10/15/2014  . Right fibular fracture 10/15/2014  . Hypoxia 10/15/2014  . Syncope and collapse 10/15/2014  . CAD (coronary artery disease), native coronary artery 09/29/2014  . Falls frequently 09/29/2014  . Hypertension   . Hypercholesteremia   . Aortic valve insufficiency   . Depression   . Atrial fibrillation, chronic 06/20/2014  . Hypothyroidism 06/20/2014  . DOE (dyspnea on exertion) 06/20/2014  . Cough 04/29/2014  . Shortness of breath 04/29/2014   Past Medical History:  Past Medical History  Diagnosis Date  . Atrial fibrillation   . Hypothyroidism   . Plantar fasciitis   . Seasonal allergies   . Decreased testosterone level   . Varicose veins   . Hypertension   . Hypercholesteremia   . Aortic valve insufficiency   . Depression   . Heart murmur   . DVT (deep venous thrombosis) ~ 2013    "BLE"  . OSA (obstructive sleep apnea)     "suppose to wear mask; I throw it off in my sleep" (10/15/2014)  . COPD (chronic obstructive pulmonary disease)   . Emphysema of lung   . History of blood transfusion 1960    "lots; related to an accident"  . Arthritis     "wee bit; knees, elbows" (10/15/2014)  . Anxiety   . Falls frequently     > 20 times in the last year/notes 10/15/2014  . Syncope and collapse "several times"  . Complication of  anesthesia     unsure of complications but pt. states there were complications  . Dysrhythmia     A. Fib  . Coronary artery disease   . Shortness of breath dyspnea   . Chronic kidney disease     "down to ~ 1/2 of their regular use; I see kidney dr. in [place]" (10/15/2014)  . Urine frequency   . Urine incontinence   . Enlarged prostate   . GERD (gastroesophageal reflux disease)   . Difficult intubation     difficult intubation 12/03/09 and 03/27/10 ([facility]); glidescope used 03/27/10   Past Surgical History:  Past Surgical History  Procedure Laterality Date  . Abdominal aortic aneurysm repair  01/2006; 03/10/2006    Archie Endo 01/12/2011  . Tonsillectomy    . Hernia repair    . Laparoscopic incisional / umbilical / ventral hernia repair  02/2010    VHR w/mesh/notes 03/28/2010  . Laparoscopic cholecystectomy  08/2009    w/IOC/notes 09/18/2009  . Ercp  11/2009    Archie Endo 12/05/2009  . Gall stone    . Cardiac catheterization  1990's X 1; 09/2014    no stents  . Nose surgery    . Brain surgery  1960 X 4    "S/P got my skull busted"; pt. states removed internal carotid artery  . Aortic valve replacement N/A 02/11/2015    Procedure: AORTIC VALVE REPLACEMENT (AVR);  Surgeon: Edward B Gerhardt, MD;  Location: [facility] OR;  Service: Open Heart Surgery;  Laterality: N/A;  . Clipping of atrial appendage N/A 02/11/2015    Procedure: CLIPPING OF ATRIAL APPENDAGE;  Surgeon: Edward B Gerhardt, MD;  Location: [facility] OR;  Service: Open Heart Surgery;  Laterality: N/A;  . Tee without cardioversion N/A 02/11/2015    Procedure: TRANSESOPHAGEAL ECHOCARDIOGRAM (TEE);  Surgeon: Edward B Gerhardt, MD;  Location: [facility] OR;  Service: Open Heart Surgery;  Laterality: N/A;   Social History:  reports that he quit smoking about 40 years ago. His smoking use included Cigarettes. He has a 44 pack-year smoking history. He has never used smokeless tobacco. He reports that he drinks alcohol. He reports that he does not use illicit  drugs.  Family / Support Systems Marital Status: Married How Long?: 53 years Patient Roles: Spouse, Parent Spouse/Significant Other: [name] Labine - wife - [phone] Children: [name] Gemmer - son - [phone] Anticipated Caregiver: wife will provide 24 hour supervision but cannot physically assist; son can help with transporation and getting them groceries, running errands, etc Ability/Limitations of Caregiver: wife can't physically assist  Social History Preferred language: English Religion: Christian Education: master's from [employer] - education Read: Yes Write: Yes Employment Status: Retired Date Retired/Disabled/Unemployed: 2000 Age Retired: 67 Legal History/Current Legal Issues: none reported Guardian/Conservator: N/A   Abuse/Neglect Physical Abuse: Denies Verbal Abuse: Denies Sexual Abuse: Denies Exploitation of patient/patient's resources: Denies Self-Neglect: Denies  Emotional Status Pt's affect, behavior and adjustment status: Pt seemed in good spirits, but he is concerned that he isn't doing as well today as he was yesterday.  CSW explained that the first day on Rehab can be exhausting with multiple evaluations. Recent Psychosocial Issues: Pt's wife has Bipolar Disorder and he helps her remember medications. Psychiatric History: Pt has a history of anxiety and depression.  He takes medications and reports this helps. Substance Abuse History: none reported  Patient / Family Perceptions, Expectations & Goals Pt/Family understanding of illness & functional limitations: Pt/wife report a good understanding of pt's condition, however pt did not realize he cannot drive right after having heart surgery.  CSW and OT, Frank Barthold, explained this to pt. Premorbid pt/family roles/activities: Pt goes out daily to run errands and works in his shop. Anticipated changes in roles/activities/participation: Pt will not be able to drive.  Will need help with  errands. Pt/family expectations/goals: Pt wants to have more stability when he is walking.  Community Resources Community Agencies: None Premorbid Home Care/DME Agencies: None Transportation available at discharge: son. Wife can drive, but hasn't been, as pt has been doing most of the driving. Resource referrals recommended: Neuropsychology  Discharge Planning Living Arrangements: Spouse/significant other Type of Residence: Private residence Insurance Resources: Medicare, Private Insurance (specify) (BCBS) Financial Resources: Social Security Financial Screen Referred: No Money Management: Patient Does the patient have any problems obtaining your medications?: No Home Management: Pt and wife were doing this togther.  Recently moved to a townhome, so no outside responsibilities. Patient/Family Preliminary Plans: Pt plans to return to his townhome with his wife. Barriers to Discharge: Steps Social Work Anticipated Follow Up Needs: HH/OP Expected length of stay: 7 days  Clinical Impression CSW met with pt and his wife to introduce self and role of CSW as well as to complete assessment.  Pt is tired today and does not feel he is doing as well as he was yesterday.  CSW told him the first day on rehab can be tiring.    Pt is motivated to recover and wants to be more stable with his mobility.  Pt helps wife manage medications and run errands.  Wife can be present for supervision, but cannot provide physical care.  Son comes weekly to check on his parents, but can be available more often as he recovers, especially since pt will not be able to drive and wife has not been driving.  Pt would prefer outpt therapy, but will most likely need to start with HH and work up to outpt therapy after d/c.  Pt has some DME that is his wife's, so CSW will order DME for pt as needed.  No current concerns/questions/needs.  Briefly met pt's son, [name], who works in IT at [employer].  CSW will continue to follow and assist  as needed.  ,  [place] 02/21/2015, 1:35 PM    

## 2015-02-21 NOTE — Evaluation (Signed)
Occupational Therapy Assessment and Plan  Patient Details  Name: [name] [name] MRN: [id number] Date of Birth: 1931-12-12  OT Diagnosis: cognitive deficits and muscle weakness (generalized) Rehab Potential: Rehab Potential (ACUTE ONLY): Excellent ELOS: 7-10 days   Today's Date: 02/21/2015 OT Individual Time: 5643-3295 OT Individual Time Calculation (min): 60 min     Problem List:  Patient Active Problem List   Diagnosis Date Noted  . Debilitated 02/20/2015  . S/P AVR 02/11/2015  . Ataxia 12/05/2014  . Abnormality of gait 12/05/2014  . Neck pain, chronic 12/05/2014  . Brisk deep tendon reflexes 12/05/2014  . Abnormal carotid duplex scan 10/28/2014  . OSA (obstructive sleep apnea) 10/28/2014  . Mood disorder with psychosis 10/28/2014  . Episodes of formed visual hallucinations 10/16/2014  . Faintness   . Syncope 10/15/2014  . Acute on chronic renal failure 10/15/2014  . Right fibular fracture 10/15/2014  . Hypoxia 10/15/2014  . Syncope and collapse 10/15/2014  . CAD (coronary artery disease), native coronary artery 09/29/2014  . Falls frequently 09/29/2014  . Hypertension   . Hypercholesteremia   . Aortic valve insufficiency   . Depression   . Atrial fibrillation, chronic 06/20/2014  . Hypothyroidism 06/20/2014  . DOE (dyspnea on exertion) 06/20/2014  . Cough 04/29/2014  . Shortness of breath 04/29/2014    Past Medical History:  Past Medical History  Diagnosis Date  . Atrial fibrillation   . Hypothyroidism   . Plantar fasciitis   . Seasonal allergies   . Decreased testosterone level   . Varicose veins   . Hypertension   . Hypercholesteremia   . Aortic valve insufficiency   . Depression   . Heart murmur   . DVT (deep venous thrombosis) ~ 2013    "BLE"  . OSA (obstructive sleep apnea)     "suppose to wear mask; I throw it off in my sleep" (10/15/2014)  . COPD (chronic obstructive pulmonary disease)   . Emphysema of lung   . History of blood transfusion  1960    "lots; related to an accident"  . Arthritis     "wee bit; knees, elbows" (10/15/2014)  . Anxiety   . Falls frequently     > 20 times in the last year/notes 10/15/2014  . Syncope and collapse "several times"  . Complication of anesthesia     unsure of complications but pt. states there were complications  . Dysrhythmia     A. Fib  . Coronary artery disease   . Shortness of breath dyspnea   . Chronic kidney disease     "down to ~ 1/2 of their regular use; I see kidney dr. in [place]" (10/15/2014)  . Urine frequency   . Urine incontinence   . Enlarged prostate   . GERD (gastroesophageal reflux disease)   . Difficult intubation     difficult intubation 12/03/09 and 03/27/10 ([facility]); glidescope used 03/27/10   Past Surgical History:  Past Surgical History  Procedure Laterality Date  . Abdominal aortic aneurysm repair  01/2006; 03/10/2006    Archie Endo 01/12/2011  . Tonsillectomy    . Hernia repair    . Laparoscopic incisional / umbilical / ventral hernia repair  02/2010    VHR w/mesh/notes 03/28/2010  . Laparoscopic cholecystectomy  08/2009    w/IOC/notes 09/18/2009  . Ercp  11/2009    Archie Endo 12/05/2009  . Gall stone    . Cardiac catheterization  1990's X 1; 09/2014    no stents  . Nose surgery    .  Brain surgery  1960 X 4    "S/P got my skull busted"; pt. states removed internal carotid artery  . Aortic valve replacement N/A 02/11/2015    Procedure: AORTIC VALVE REPLACEMENT (AVR);  Surgeon: Grace Isaac, MD;  Location: [place];  Service: Open Heart Surgery;  Laterality: N/A;  . Clipping of atrial appendage N/A 02/11/2015    Procedure: CLIPPING OF ATRIAL APPENDAGE;  Surgeon: Grace Isaac, MD;  Location: [place];  Service: Open Heart Surgery;  Laterality: N/A;  . Tee without cardioversion N/A 02/11/2015    Procedure: TRANSESOPHAGEAL ECHOCARDIOGRAM (TEE);  Surgeon: Grace Isaac, MD;  Location: [place];  Service: Open Heart Surgery;  Laterality: N/A;    Assessment & Plan Clinical  Impression: Patient is a 81 y.o. right handed male with history of atrial fibrillation maintained onEliquis, chronic renal insufficiency with baseline creatinine 1.69, Traumatic brain injury with craniotomy 1960. Presented 02/11/2015 with history of severe aortic insufficiency and has been followed at [facility] in the past. Patient lives with his wife using a walker and cane at times prior to admission with history of frequent falls. Patient with recent nursing home placement February 2016 after right proximal fibula fracture after a fall. Evaluation by cardiothoracic surgery for aortic valve insufficiency and underwent aortic valve replacement 02/17/2015 per Dr. Servando Snare. Hospital course pain management. Eliquis resumed postoperatively. Contact precautions for MRSA PCR screen positive. Bouts of nausea with history of pancreatitis lipase level 82 and diet slowly advanced. Ultrasound gallbladder showed mild fatty infiltration of liver otherwise unremarkable. Physical therapy ongoing and easily fatigued. M.D. has requested physical medicine rehabilitation consult. Patient was admitted for comprehensive rehabilitation program  Patient states he still has pain while coughing. Otherwise his incision is not painful unless he moves.  Patient transferred to CIR on 02/20/2015.    Patient currently requires min with basic self-care skills secondary to muscle weakness and decreased cardiorespiratoy endurance.  Prior to hospitalization, patient could complete BADL independently.   Patient will benefit from skilled intervention to increase independence with basic self-care skills prior to discharge home with care partner.  Anticipate patient will require 24 hour supervision and intermittent supervision and follow up home health.  OT - End of Session Activity Tolerance: Tolerates 10 - 20 min activity with multiple rests Endurance Deficit: Yes OT Assessment Rehab Potential (ACUTE ONLY): Excellent OT Patient  demonstrates impairments in the following area(s): Balance;Cognition;Endurance;Safety OT Basic ADL's Functional Problem(s): Eating;Grooming;Bathing;Dressing;Toileting OT Transfers Functional Problem(s): Toilet;Tub/Shower OT Additional Impairment(s): Other (comment) (Porter of BUE) OT Plan OT Intensity: Minimum of 1-2 x/day, 45 to 90 minutes OT Frequency: 5 out of 7 days OT Duration/Estimated Length of Stay: 7-10 days OT Treatment/Interventions: Balance/vestibular training;Cognitive remediation/compensation;Patient/family education;Functional mobility training;UE/LE Coordination activities;Therapeutic Exercise;Therapeutic Activities;Self Care/advanced ADL retraining;DME/adaptive equipment instruction;Discharge planning OT Self Feeding Anticipated Outcome(s): Mod I OT Basic Self-Care Anticipated Outcome(s): Supervision OT Toileting Anticipated Outcome(s): Mod I OT Bathroom Transfers Anticipated Outcome(s): Supervision OT Recommendation Patient destination: Home Follow Up Recommendations: Home health OT Equipment Recommended: To be determined   Skilled Therapeutic Intervention OT 1:1 evaluation completed with focus on awareness, attention, orientation, pt ed on treatment goals and methods, adherence to sternal precautions during BADL, effective use of DME, and adpated dressing skills.   Pt received supine in bed with mild confusion and disorientation although resolved with extra time and reinforcement.   Pt able to rise to edge of bed with mod assist (lifting trunk) and mod vc to maintain precautions.    Pt able to  stand with steadying assist and ambulate to sink to groom and dress with contact guard.   Pt shaved and brushed his teeth before reporting need to toilet where he remained to void BM after steadying assist with transfer and clothing management.    OT Evaluation Precautions/Restrictions  Precautions Precautions: Fall;Sternal Restrictions Weight Bearing Restrictions: Yes RLE Weight  Bearing: Weight bearing as tolerated  General Chart Reviewed: Yes Family/Caregiver Present: No  Vital Signs Therapy Vitals Pulse Rate: 84 BP: 112/72 mmHg Patient Position (if appropriate): Sitting Oxygen Therapy SpO2: 98 % O2 Device: Not Delivered  Pain Pain Assessment Pain Assessment: 0-10 Pain Score: 2  Pain Location: Chest Pain Orientation: Mid  Home Living/Prior Functioning Home Living Living Arrangements: Spouse/significant other Available Help at Discharge: Family (reports wife with bipolar d/o and arthritis) Type of Home: House Home Access: Stairs to enter [employer] of Steps: reports new home (townhouse), level entry during eval?? Entrance Stairs-Rails: None Home Layout: Two level Alternate Level Stairs-Number of Steps: 13 Alternate Level Stairs-Rails: Right (rail on wall, uses right going up and left UE going down)  Lives With: Spouse IADL History Homemaking Responsibilities: Yes Meal Prep Responsibility: Secondary (uses lean cuisine meals) Laundry Responsibility: Secondary Cleaning Responsibility: Primary Bill Paying/Finance Responsibility: Secondary Shopping Responsibility: Primary Child Care Responsibility: No Homemaking Comments: Pt reports primary homemaking role although spouse now doing more Current License: Yes Mode of Transportation: Car ([place]) Education: MS+, Ed and Estate agent at [employer], last [employer] Occupation: Retired Type of Occupation: Museum/gallery conservator, Printmaker Leisure and Hobbies: Workshop (formerly in basement now stored in [employer] Prior Function Level of Independence: Independent with basic ADLs, Independent with transfers  Able to Take Stairs?: Yes Driving: Yes Vocation: Retired Leisure: Hobbies-yes (Comment)  ADL ADL ADL Comments: see FIM  Vision/Perception  Vision- History Baseline Vision/History: Wears glasses Wears Glasses: Reading only Patient Visual Report: Blurring of  vision Vision- Assessment Vision Assessment?: Vision impaired- to be further tested in functional context   Cognition Overall Cognitive Status: History of cognitive impairments - at baseline Arousal/Alertness: Awake/alert Orientation Level: Person;Place;Situation Person: Oriented Place: Oriented Situation: Oriented Year: 2016 Month: June Day of Week: Incorrect Memory: Impaired Memory Impairment: Decreased short term memory Immediate Memory Recall: Sock;Blue;Bed Memory Recall: Blue;Bed;Sock Memory Recall Sock: With Cue Memory Recall Blue: Without Cue Memory Recall Bed: Without Cue Attention: Selective Selective Attention: Appears intact Awareness: Impaired Awareness Impairment: Intellectual impairment Problem Solving: Impaired Executive Function: Writer: Impaired Organizing Impairment: Verbal complex;Functional complex  Sensation Sensation Light Touch: Appears Intact Stereognosis: Appears Intact Hot/Cold: Appears Intact Proprioception: Appears Intact Coordination Gross Motor Movements are Fluid and Coordinated: No Fine Motor Movements are Fluid and Coordinated: No Coordination and Movement Description: Mild tremors, bilateral hands impair dexterity Finger Nose Finger Test: dysmeteria, right UE (overshoots and undershoots target during multiple attempts).  Motor  Motor Motor: Other (comment) Motor - Skilled Clinical Observations: mild intention tremors BUE, left knee buckles during functional ambulation  Mobility  Bed Mobility Bed Mobility: Sit to Sidelying Right;Right Sidelying to Sit;Rolling Left;Rolling Right Rolling Right: 5: Supervision Rolling Right Details: Verbal cues for technique;Verbal cues for precautions/safety Rolling Left: 5: Supervision Rolling Left Details: Verbal cues for technique;Verbal cues for precautions/safety Right Sidelying to Sit: 3: Mod assist Right Sidelying to Sit Details: Manual facilitation for placement;Verbal cues  for technique;Verbal cues for precautions/safety Sit to Sidelying Right: 3: Mod assist Sit to Sidelying Right Details: Manual facilitation for placement;Verbal cues for precautions/safety;Verbal cues for technique Transfers Sit to Stand: 4: Min assist  Sit to Stand Details: Manual facilitation for placement;Verbal cues for technique Sit to Stand Details (indicate cue type and reason): builds momentum by rocking forward Stand to Sit: 4: Min guard   Trunk/Postural Assessment  Cervical Assessment Cervical Assessment: Within Functional Limits Thoracic Assessment Thoracic Assessment: Within Functional Limits Lumbar Assessment Lumbar Assessment: Exceptions to [facility] Postural Control Postural Control: Deficits on evaluation Trunk Control: can correct poor posture during sitting with cues.   Balance Static Sitting Balance Static Sitting - Balance Support: Feet supported;No upper extremity supported Static Sitting - Level of Assistance: 5: Stand by assistance Dynamic Sitting Balance Dynamic Sitting - Balance Support: Feet supported;No upper extremity supported Dynamic Sitting - Level of Assistance: 4: Min Insurance risk surveyor Standing - Balance Support: During functional activity;No upper extremity supported Static Standing - Level of Assistance: 4: Min assist Dynamic Standing Balance Dynamic Standing - Balance Support: During functional activity;No upper extremity supported Dynamic Standing - Level of Assistance: 4: Min assist   Extremity/Trunk Assessment RUE Assessment RUE Assessment: Exceptions to [facility] RUE Strength RUE Overall Strength: Due to precautions LUE Assessment LUE Assessment: Exceptions to [facility] LUE Strength LUE Overall Strength: Due to precautions  FIM:  FIM - Eating Eating Activity: 5: Set-up assist for open containers FIM - Grooming Grooming Steps: Wash, rinse, dry face;Wash, rinse, dry hands;Oral care, brush teeth, clean dentures;Shave or apply  make-up Grooming: 5: Set-up assist to obtain items FIM - Upper Body Dressing/Undressing Upper body dressing/undressing steps patient completed: Thread/unthread right sleeve of pullover shirt/dresss;Thread/unthread left sleeve of pullover shirt/dress;Put head through opening of pull over shirt/dress;Pull shirt over trunk Upper body dressing/undressing: 5: Set-up assist to: Obtain clothing/put away FIM - Lower Body Dressing/Undressing Lower body dressing/undressing steps patient completed: Thread/unthread right underwear leg;Thread/unthread left underwear leg;Pull pants up/down Lower body dressing/undressing: 2: Max-Patient completed 25-49% of tasks FIM - Toileting Toileting steps completed by patient: Adjust clothing prior to toileting;Performs perineal hygiene Toileting: 3: Mod-Patient completed 2 of 3 steps FIM - Bed/Chair Transfer Bed/Chair Transfer: 3: Supine > Sit: Mod A (lifting assist/Pt. 50-74%/lift 2 legs;4: Bed > Chair or W/C: Min A (steadying Pt. > 75%) FIM - Radio producer Devices: Grab bars Toilet Transfers: 4-To toilet/BSC: Min A (steadying Pt. > 75%);4-From toilet/BSC: Min A (steadying Pt. > 75%) FIM - Systems developer Devices: Shower chair;Walk in Magazine features editor Transfers: 4-Into Tub/Shower: Min A (steadying Pt. > 75%/lift 1 leg);4-Out of Tub/Shower: Min A (steadying Pt. > 75%/lift 1 leg)   Refer to Care Plan for Long Term Goals  Recommendations for other services: None  Discharge Criteria: Patient will be discharged from OT if patient refuses treatment 3 consecutive times without medical reason, if treatment goals not met, if there is a change in medical status, if patient makes no progress towards goals or if patient is discharged from hospital.  The above assessment, treatment plan, treatment alternatives and goals were discussed and mutually agreed upon: by patient and by family   Second session: Time:  1300-1400 Time Calculation (min):  60 min  Pain Assessment: no/denies pain  Skilled Therapeutic Interventions: ADL-retraining with focus on transfers, AE training (sock aid and reacher), functional mobility, and discharge planning with caregiver (wife) present.   OT confirmed pt's responses during assessment with pt's spouse present.   Pt instructed on use of sock aid due to new socks present with pt response during assessment that he is unable to reach his feet to don socks.   After demo of sock aid,  pt partially donned right sock and confide that has compensated for decreased flexibility by electing to not wear socks at home, preferring instead to remain barefoot.   Pt states that he dons his shoes that are laced low to allow him to put his feet into shoes without having to bend down to tie his laces.   Spouse confirms that she does not assist d/t her arthritis.   Pt was instructed on use of reacher which he attempted to use but will require more practice to gain proficiency.    At OT request, pt ambulated to bathroom to test height of shower chair and transfer skills.   Pt ambulates with hand held assist and transfers with min guard for safety.    Pt then continued demo of gait by pushing his w/c to family room however fatiguing quickly at 36' and required immediate mod assist transfer to w/c d/t collapse a left leg d/t weakness. Pt was escorted back to his room at end of session to sit beside his wife to await PT appt.   See FIM for current functional status  Therapy/Group: Individual Therapy  Mikhi Athey 02/22/2015, 7:13 AM

## 2015-02-21 NOTE — Progress Notes (Signed)
Patient ID: [name] [name], male   DOB: 07/07/1932, 81 y.o.   MRN: [id number]      [address]       [name] [id number]             [phone]                     . Aortic valve replacement N/A 02/11/2015    Procedure: AORTIC VALVE REPLACEMENT (AVR);  Surgeon: [name], MD;  Location[facility];  Service: Open Heart Surgery;  Laterality: N/A;  . Clipping of atrial appendage N/A 02/11/2015    Procedure: CLIPPING OF ATRIAL APPENDAGE;  Surgeon: [name], MD;  Location: [facility] OR;  Service: Open Heart Surgery;  Laterality: N/A;  . Tee without cardioversion N/A 02/11/2015    Procedure: TRANSESOPHAGEAL ECHOCARDIOGRAM (TEE);  Surgeon: [name], MD;  Location: [facility] OR;  Service: Open Heart Surgery;  Laterality: N/A;   Subjective: Now on rehab, feels well, bowels moving, no abdominal discomfort   Objective: Vital signs in last 24 hours: Patient Vitals for the past 24 hrs:  BP Temp Temp src Pulse Resp SpO2 Height Weight  02/21/15 0510 (!) 108/93 mmHg 97.9 F (36.6 C) Oral 89 18 100 % - -  02/20/15 1830 110/81 mmHg 98.5 F (36.9 C) Oral 93 18 96 % 6' (1.829 m) 251 lb 15.8 oz (114.3 kg)    Filed Weights   02/20/15 1830  Weight: 251 lb 15.8 oz (114.3 kg)    Hemodynamic parameters for last 24 hours:    Intake/Output from previous day:   Intake/Output this shift: Total I/O In: 120 [P.O.:120] Out: -   Scheduled Meds: . amLODipine  5 mg Oral Daily  . apixaban  2.5 mg Oral BID  . aspirin EC  81 mg Oral Daily   Or  . aspirin  81 mg Per Tube Daily  . atorvastatin  10 mg Oral Daily  . buPROPion  300 mg Oral Daily  . busPIRone  15 mg Oral BID  . clonazePAM  0.5 mg Oral QHS  . diltiazem  60 mg Oral Q12H  . docusate sodium  200 mg Oral Daily  . dutasteride  0.5 mg Oral Daily  . escitalopram  20 mg Oral Daily  . fluticasone  1 spray Each Nare Daily  . lamoTRIgine  100 mg Oral BID  . levothyroxine  150 mcg Oral QAC breakfast  . pantoprazole  40 mg  Oral Daily   Continuous Infusions:  PRN Meds:.albuterol, ondansetron **OR** ondansetron (ZOFRAN) IV, sorbitol, traMADol  General appearance: alert and cooperative Neurologic: intact Heart: irregularly irregular rhythm Lungs: clear to auscultation bilaterally Abdomen: soft, non-tender; bowel sounds normal; no masses,  no organomegaly Extremities: extremities normal, atraumatic, no cyanosis or edema and Homans sign is negative, no sign of DVT Wound: sternum stable  Lab Results: CBC: Recent Labs  02/21/15 0542  WBC 6.4  HGB 9.9*  HCT 30.5*  PLT 311   BMET:  Recent Labs  02/20/15 0427 02/21/15 0542  NA 138 140  K 3.9 4.4  CL 104 101  CO2 27 28  GLUCOSE 89 97  BUN 11 10  CREATININE 1.54* 1.66*  CALCIUM 8.8* 9.5    PT/INR: No results for input(s): LABPROT, INR in the last 72 hours.   Radiology No results found.   Assessment/Plan: Stable postop, back on preop anticoagulation per cardiology for afib long standing   [name] MD 02/21/2015 10:45 AM

## 2015-02-21 NOTE — Evaluation (Addendum)
Physical Therapy Assessment and Plan  Patient Details  Name: [name] [name] MRN: [id number] Date of Birth: September 15, 1931  PT Diagnosis: Abnormal posture, Abnormality of gait, Cognitive deficits, Coordination disorder, Difficulty walking, Edema, Impaired cognition, Muscle weakness, Osteoarthritis and Pain in chest Rehab Potential: Good ELOS: 10-14 days   Today's Date: 02/21/2015 PT Individual Time:Treatment Session 1: 1400-1500 Treatment Session 2: 1600-1630 PT Individual Time Calculation (min): Treatment Session 1: 60 min Treatment Session 2: 30 min    Problem List:  Patient Active Problem List   Diagnosis Date Noted  . Debilitated 02/20/2015  . S/P AVR 02/11/2015  . Ataxia 12/05/2014  . Abnormality of gait 12/05/2014  . Neck pain, chronic 12/05/2014  . Brisk deep tendon reflexes 12/05/2014  . Abnormal carotid duplex scan 10/28/2014  . OSA (obstructive sleep apnea) 10/28/2014  . Mood disorder with psychosis 10/28/2014  . Episodes of formed visual hallucinations 10/16/2014  . Faintness   . Syncope 10/15/2014  . Acute on chronic renal failure 10/15/2014  . Right fibular fracture 10/15/2014  . Hypoxia 10/15/2014  . Syncope and collapse 10/15/2014  . CAD (coronary artery disease), native coronary artery 09/29/2014  . Falls frequently 09/29/2014  . Hypertension   . Hypercholesteremia   . Aortic valve insufficiency   . Depression   . Atrial fibrillation, chronic 06/20/2014  . Hypothyroidism 06/20/2014  . DOE (dyspnea on exertion) 06/20/2014  . Cough 04/29/2014  . Shortness of breath 04/29/2014    Past Medical History:  Past Medical History  Diagnosis Date  . Atrial fibrillation   . Hypothyroidism   . Plantar fasciitis   . Seasonal allergies   . Decreased testosterone level   . Varicose veins   . Hypertension   . Hypercholesteremia   . Aortic valve insufficiency   . Depression   . Heart murmur   . DVT (deep venous thrombosis) ~ 2013    "BLE"  . OSA (obstructive  sleep apnea)     "suppose to wear mask; I throw it off in my sleep" (10/15/2014)  . COPD (chronic obstructive pulmonary disease)   . Emphysema of lung   . History of blood transfusion 1960    "lots; related to an accident"  . Arthritis     "wee bit; knees, elbows" (10/15/2014)  . Anxiety   . Falls frequently     > 20 times in the last year/notes 10/15/2014  . Syncope and collapse "several times"  . Complication of anesthesia     unsure of complications but pt. states there were complications  . Dysrhythmia     A. Fib  . Coronary artery disease   . Shortness of breath dyspnea   . Chronic kidney disease     "down to ~ 1/2 of their regular use; I see kidney dr. in [place]" (10/15/2014)  . Urine frequency   . Urine incontinence   . Enlarged prostate   . GERD (gastroesophageal reflux disease)   . Difficult intubation     difficult intubation 12/03/09 and 03/27/10 ([facility]); glidescope used 03/27/10   Past Surgical History:  Past Surgical History  Procedure Laterality Date  . Abdominal aortic aneurysm repair  01/2006; 03/10/2006    Archie Endo 01/12/2011  . Tonsillectomy    . Hernia repair    . Laparoscopic incisional / umbilical / ventral hernia repair  02/2010    VHR w/mesh/notes 03/28/2010  . Laparoscopic cholecystectomy  08/2009    w/IOC/notes 09/18/2009  . Ercp  11/2009    Archie Endo 12/05/2009  . Gall stone    .  Cardiac catheterization  1990's X 1; 09/2014    no stents  . Nose surgery    . Brain surgery  1960 X 4    "S/P got my skull busted"; pt. states removed internal carotid artery  . Aortic valve replacement N/A 02/11/2015    Procedure: AORTIC VALVE REPLACEMENT (AVR);  Surgeon: Grace Isaac, MD;  Location: [place];  Service: Open Heart Surgery;  Laterality: N/A;  . Clipping of atrial appendage N/A 02/11/2015    Procedure: CLIPPING OF ATRIAL APPENDAGE;  Surgeon: Grace Isaac, MD;  Location: [place];  Service: Open Heart Surgery;  Laterality: N/A;  . Tee without cardioversion N/A  02/11/2015    Procedure: TRANSESOPHAGEAL ECHOCARDIOGRAM (TEE);  Surgeon: Grace Isaac, MD;  Location: [place];  Service: Open Heart Surgery;  Laterality: N/A;    Assessment & Plan Clinical Impression:Chrysler R Massman is a 81 y.o. right handed male with history of atrial fibrillation maintained onEliquis, chronic renal insufficiency with baseline creatinine 1.69, Traumatic brain injury with craniotomy 1960. Presented 02/11/2015 with history of severe aortic insufficiency and has been followed at [facility] in the past. Patient lives with his wife using a walker and cane at times prior to admission with history of frequent falls. Patient with recent nursing home placement February 2016 after right proximal fibula fracture after a fall. Evaluation by cardiothoracic surgery for aortic valve insufficiency and underwent aortic valve replacement 02/17/2015 per Dr. Servando Snare. Hospital course pain management. Eliquis resumed postoperatively. Contact precautions for MRSA PCR screen positive. Bouts of nausea with history of pancreatitis lipase level 82 and diet slowly advanced. Ultrasound gallbladder showed mild fatty infiltration of liver otherwise unremarkable. Physical therapy ongoing and easily fatigued. M.D. has requested physical medicine rehabilitation consult. Patient was admitted for comprehensive rehabilitation program  Patient transferred to CIR on 02/20/2015 .   Patient currently requires mod with mobility secondary to muscle weakness, decreased cardiorespiratoy endurance, abnormal tone, decreased coordination and decreased motor planning, decreased visual acuity, decreased awareness, decreased problem solving, decreased safety awareness and decreased memory and decreased sitting balance, decreased standing balance, decreased postural control, decreased balance strategies and difficulty maintaining precautions.  Prior to hospitalization, patient was Independent but sustained multiple falls with  mobility and lived with Spouse (most recently SNF, but plans to d/c home) in a House home.  Home access is reports new home (townhouse), level entry during eval??Level entry.  Patient will benefit from skilled PT intervention to maximize safe functional mobility, minimize fall risk and decrease caregiver burden for planned discharge home with 24 hour supervision.  Anticipate patient will benefit from cardiac rehab at discharge.  PT - End of Session Endurance Deficit: Yes  Skilled Therapeutic Intervention Treatment Session 1: PT Evaluation: Pt is an elderly male who recently underwent an AVR and now has sternal precautions. PMH is significant for 13 falls in the last month, a fall in 09/2014 resulting in a fibular fracture, remote history of TBI with craniotomy in 1960, a-fib, and COPD & emphysema.   Therapeutic Activity: PT instructs pt in sit to stand from w/c req min A and verbal cues to keep hands crossed on chest and compensation techniques using momentum and rocking to assist, min A stand-step transfer w/c to bed without AD and bed to w/c with RW only for balance req min A, as well. Pt req mod A for B LEs during sit to R side lie, SBA to roll R and L in bed, and mod A at trunk for R  side lie to sit transfer without pt using arms.   Gait Training: PT instructs pt in ambulation with RW x 40' req min A for balance from PT with RW for balance and +2 from wife for w/c follow - pt demonstrates poor foot clearance bilaterally, stubbing his foot on floor, and short step length.   W/C Management: PT instructs pt to have someone else lock & unlock brakes due to sternal precautions and wife agrees to help with this. PT instructs pt in w/c propulsion with B LEs x 40' req Supervision.   Treatment Session 2: W/C Management: PT instructs pt in w/c propulsion with B LEs: 75' x 4 reps req encouragement for this distance, but pt req rest breaks due to SOB.   Gait Training: PT instructs pt in  ascending/descending one low (3" height) step with B handrails for balance only req min A.   PT initiated 5TSTS test, but pt only able to complete 2 sit to stands in 49 seconds before asking to end the test due to fatigue. Pt is a Scientist, research (physical sciences), but has very low activity tolerance and multiple comorbidities that will affect his progress on inpatient rehab. Continue per PT POC.    PT Evaluation Precautions/Restrictions Precautions Precautions: Fall;Sternal Restrictions Weight Bearing Restrictions: Yes RLE Weight Bearing: Weight bearing as tolerated General Chart Reviewed: Yes Family/Caregiver Present: Yes (wife) Vital SignsTherapy Vitals Pulse Rate: 84 BP: 112/72 mmHg Patient Position (if appropriate): Sitting Oxygen Therapy SpO2: 98 % O2 Device: Not Delivered Pain Pain Assessment Pain Assessment: 0-10 Pain Score: 8  Pain Type: Surgical pain Pain Location: Sternum Pain Orientation: Mid Pain Descriptors / Indicators: Sore Pain Onset: With Activity Pain Intervention(s): Rest Multiple Pain Sites: No  Treatment Session 2: Pt c/o 5/10 generalized soreness throughout body, but ready agrees to participate in PT.   Home Living/Prior Functioning Home Living Available Help at Discharge: Family (wife with bipolar d/o and arthritis) Type of Home: House Home Access: Level entry Entrance Stairs-Number of Steps: reports new home (townhouse), level entry during eval?? Home Layout: Two level Alternate Level Stairs-Number of Steps: 13 Alternate Level Stairs-Rails: Right (rail on wall: uses R rail up and L arm down)  Lives With: Spouse (most recently SNF, but plans to d/c home) Prior Function Level of Independence: Independent with gait;Independent with transfers (but req assist for safe mobility due to 13 falls in the past month)  Able to Take Stairs?: Yes (with rest breaks due to cardiac issues) Driving: Yes Vocation: Retired Comments: Pt reports he was amb without assistive device.  History of frequent falls. Cognition Overall Cognitive Status: History of cognitive impairments - at baseline Arousal/Alertness: Awake/alert Orientation Level: Oriented to person;Disoriented to time;Oriented to place;Oriented to situation Attention: Focused Focused Attention: Appears intact Selective Attention: Appears intact Memory: Impaired Memory Impairment: Decreased recall of new information Awareness: Impaired Awareness Impairment: Emergent impairment Problem Solving: Impaired Problem Solving Impairment: Functional basic Executive Function: Organizing Organizing: Impaired Organizing Impairment: Verbal complex;Functional complex Sensation Sensation Light Touch: Appears Intact Stereognosis: Not tested Hot/Cold: Not tested Proprioception: Appears Intact Coordination Gross Motor Movements are Fluid and Coordinated: No Fine Motor Movements are Fluid and Coordinated: No Coordination and Movement Description: Mild-moderate tremors, bilateral hands impair dexterity Finger Nose Finger Test: dysmeteria, right UE (overshoots and undershoots target during multiple attempts). Heel Shin Test: dysmetria bilaterally, but L worse than R Motor  Motor Motor: Abnormal tone;Abnormal postural alignment and control Motor - Skilled Clinical Observations: slouched sitting posture with forward head, B UE/LE tremors  Mobility Bed Mobility Bed Mobility: Sit to Sidelying Right;Right Sidelying to Sit;Rolling Left;Rolling Right Rolling Right: 5: Supervision Rolling Right Details: Verbal cues for technique;Verbal cues for precautions/safety Rolling Left: 5: Supervision Rolling Left Details: Verbal cues for technique;Verbal cues for precautions/safety Right Sidelying to Sit: 3: Mod assist Right Sidelying to Sit Details: Manual facilitation for placement;Verbal cues for technique;Verbal cues for precautions/safety Sit to Sidelying Right: 3: Mod assist Sit to Sidelying Right Details: Manual  facilitation for placement;Verbal cues for precautions/safety;Verbal cues for technique Transfers Transfers: Yes Sit to Stand: 4: Min assist Sit to Stand Details: Manual facilitation for placement;Verbal cues for technique Sit to Stand Details (indicate cue type and reason): using rocking momentum x 3 with arms crossed on front of chest  Stand to Sit: 4: Min guard Stand Pivot Transfers: 4: Min Psychologist, occupational Details: Manual facilitation for placement;Verbal cues for technique Stand Pivot Transfer Details (indicate cue type and reason): w/c to bed without AD; bed to w/c with RW Locomotion  Ambulation Ambulation: Yes Ambulation/Gait Assistance: 1: +2 Total assist;4: Min assist Ambulation Distance (Feet): 40 Feet Assistive device: Rolling walker Ambulation/Gait Assistance Details: Manual facilitation for placement Ambulation/Gait Assistance Details: slow, short steps - PT places fingers under pt's hand on RW to ensure he doesn't put too much pressure through hands.  Gait Gait: Yes Gait Pattern: Impaired Gait Pattern: Poor foot clearance - left;Poor foot clearance - right;Wide base of support Gait velocity: Decreased Stairs / Additional Locomotion Stairs: Yes Stairs Assistance: 4: Min assist Stairs Assistance Details: Verbal cues for technique;Manual facilitation for placement Stairs Assistance Details (indicate cue type and reason): B handrail for balance only Stair Management Technique: Two rails Number of Stairs: 1 Height of Stairs: 3 Wheelchair Mobility Wheelchair Mobility: Yes Wheelchair Assistance: 5: Investment banker, operational Details: Verbal cues for Marketing executive: Both lower extermities Wheelchair Parts Management: Needs assistance Distance: 40  Trunk/Postural Assessment  Cervical Assessment Cervical Assessment: Within Functional Limits Thoracic Assessment Thoracic Assessment: Within Functional Limits Lumbar Assessment Lumbar  Assessment: Exceptions to [facility] Lumbar AROM Overall Lumbar AROM: Deficits;Due to premorid status Overall Lumbar AROM Comments: generalized stiffness Postural Control Postural Control: Deficits on evaluation Trunk Control: decreased awareness of cone of stability Postural Limitations: general slouched sitting posture with forward head  Balance Balance Balance Assessed: Yes Static Sitting Balance Static Sitting - Balance Support: Feet supported;No upper extremity supported Static Sitting - Level of Assistance: 5: Stand by assistance Dynamic Sitting Balance Dynamic Sitting - Balance Support: Feet supported;No upper extremity supported Dynamic Sitting - Level of Assistance: 4: Min Insurance risk surveyor Standing - Balance Support: During functional activity;No upper extremity supported Static Standing - Level of Assistance: 4: Min assist Dynamic Standing Balance Dynamic Standing - Balance Support: During functional activity;No upper extremity supported Dynamic Standing - Level of Assistance: 4: Min assist Extremity Assessment  RUE Assessment RUE Assessment: Exceptions to [facility] RUE AROM (degrees) Overall AROM Right Upper Extremity: Deficits;Due to precautions RUE Overall AROM Comments: arm flexion limited to 90 degrees (sternal prec); elbow and grip wfl RUE Strength RUE Overall Strength Comments: shoulder & elbow NT due to precautions; grip 5/5 LUE Assessment LUE Assessment: Exceptions to WFL LUE AROM (degrees) Overall AROM Left Upper Extremity: Deficits;Due to precautions LUE Overall AROM Comments: arm flexion limited to 90 degrees (sternal prec); elbow and grip wfl LUE Strength LUE Overall Strength: Due to precautions LUE Overall Strength Comments: shoulder & elbow NT due to sternal precautions; grip 5/5 RLE Assessment RLE Assessment: Exceptions to  WFL RLE AROM (degrees) Overall AROM Right Lower Extremity: Within functional limits for tasks assessed RLE  Strength RLE Overall Strength: Deficits;Due to premorbid status RLE Overall Strength Comments: hip flexion 4/5; knee and ankle 5/5 LLE Assessment LLE Assessment: Exceptions to WFL LLE AROM (degrees) Overall AROM Left Lower Extremity: Within functional limits for tasks assessed LLE Strength LLE Overall Strength: Deficits;Due to premorbid status LLE Overall Strength Comments: hip flexion 4/5; knee and ankle 5/5  FIM:  FIM - Bed/Chair Transfer Bed/Chair Transfer: 3: Supine > Sit: Mod A (lifting assist/Pt. 50-74%/lift 2 legs;4: Bed > Chair or W/C: Min A (steadying Pt. > 75%)   Refer to Care Plan for Long Term Goals  Recommendations for other services: None  Discharge Criteria: Patient will be discharged from PT if patient refuses treatment 3 consecutive times without medical reason, if treatment goals not met, if there is a change in medical status, if patient makes no progress towards goals or if patient is discharged from hospital.  The above assessment, treatment plan, treatment alternatives and goals were discussed and mutually agreed upon: by patient and by family  [facility] M 02/21/2015, 2:49 PM

## 2015-02-21 NOTE — IPOC Note (Addendum)
Overall Plan of Care [facility]) Patient Details Name: RIDGELY FAWCETT MRN: [id number] DOB: 10/09/1931  Admitting Diagnosis: Deconditioned avr no Slp  Hospital Problems: Active Problems:   Atrial fibrillation, chronic   S/P AVR   Debilitated     Functional Problem List: Nursing Endurance  PT Balance, Pain, Edema, Safety, Endurance, Motor, Skin Integrity, Other (comment) (baseline cognitive deficits)  OT Balance, Cognition, Endurance, Safety  SLP    TR         Basic ADL's: OT Eating, Grooming, Bathing, Dressing, Toileting     Advanced  ADL's: OT       Transfers: PT Bed Mobility, Bed to Chair, Car, Occupational psychologist, Research scientist (life sciences): PT Ambulation, Psychologist, prison and probation services, Stairs     Additional Impairments: OT Other (comment) ([facility] of BUE)  SLP        TR      Anticipated Outcomes Item Anticipated Outcome  Self Feeding Mod I  Swallowing      Basic self-care  Supervision  Toileting  Mod I   Bathroom Transfers Supervision  Bowel/Bladder  Pt will manage bowel/bladder with minimal assistance.  Transfers  supervision  Locomotion  supervision with RW for balance  Communication     Cognition     Pain  Pt will experience pain less than or equal to 4.  Safety/Judgment  Pt will call for assistance when it is needed.    Therapy Plan: PT Intensity: Minimum of 1-2 x/day ,45 to 90 minutes PT Frequency: 5 out of 7 days PT Duration Estimated Length of Stay: 14-17 days OT Intensity: Minimum of 1-2 x/day, 45 to 90 minutes OT Frequency: 5 out of 7 days OT Duration/Estimated Length of Stay: 7-10 days         Team Interventions: Nursing Interventions Other (comment)  PT interventions Ambulation/gait training, Cognitive remediation/compensation, Discharge planning, DME/adaptive equipment instruction, Functional mobility training, Pain management, Psychosocial support, Therapeutic Activities, UE/LE Strength taining/ROM, Visual/perceptual  remediation/compensation, Warden/ranger, Community reintegration, Disease management/prevention, Neuromuscular re-education, Patient/family education, Skin care/wound management, Stair training, Therapeutic Exercise, UE/LE Coordination activities, Wheelchair propulsion/positioning  OT Interventions Warden/ranger, Cognitive remediation/compensation, Patient/family education, Functional mobility training, UE/LE Coordination activities, Therapeutic Exercise, Therapeutic Activities, Self Care/advanced ADL retraining, DME/adaptive equipment instruction, Discharge planning  SLP Interventions    TR Interventions    SW/CM Interventions Discharge Planning, Psychosocial Support, Patient/Family Education    Team Discharge Planning: Destination: PT-Home (home with wife vs. snf tbd) ,OT- Home , SLP-  Projected Follow-up: PT-24 hour supervision/assistance, OT-  Home health OT, SLP-  Projected Equipment Needs: PT-To be determined, OT- To be determined, SLP-  Equipment Details: PT-Pt's wife has RW, 4WW, and w/c from prior injuries, but these would all be too small for this pt. , OT-  Patient/family involved in discharge planning: PT- Patient, Family member/caregiver,  OT-Patient, Family member/caregiver, SLP-   MD ELOS: 11-12 days Medical Rehab Prognosis:  Excellent Assessment: The patient has been admitted for CIR therapies with the diagnosis of  debility. The team will be addressing functional mobility, strength, stamina, balance, safety, adaptive techniques and equipment, self-care, bowel and bladder mgt, patient and caregiver education, activity tolerance, cognitive awareness, ego support, community reintegration. Goals have been set at mod I to supervision for self-care, mobility, cognition.    Ranelle Oyster, MD, FAAPMR      See Team Conference Notes for weekly updates to the plan of care

## 2015-02-21 NOTE — Progress Notes (Signed)
[place] PHYSICAL MEDICINE & REHABILITATION     PROGRESS NOTE    Subjective/Complaints: In no distress. Didn't sleep real well. Not sure of what time it is.   ROS: Pt denies fever, rash/itching, headache, blurred or double vision, nausea, vomiting, abdominal pain, diarrhea, chest pain, shortness of breath, palpitations, dysuria, dizziness, neck or back pain, bleeding, anxiety, or depression   Objective: Vital Signs: Blood pressure 108/93, pulse 89, temperature 97.9 F (36.6 C), temperature source Oral, resp. rate 18, height 6' (1.829 m), weight 114.3 kg (251 lb 15.8 oz), SpO2 100 %. No results found.  Recent Labs  02/18/15 1033 02/21/15 0542  WBC 6.1 6.4  HGB 9.4* 9.9*  HCT 29.3* 30.5*  PLT 238 311    Recent Labs  02/20/15 0427 02/21/15 0542  NA 138 140  K 3.9 4.4  CL 104 101  GLUCOSE 89 97  BUN 11 10  CREATININE 1.54* 1.66*  CALCIUM 8.8* 9.5   CBG (last 3)   Recent Labs  02/19/15 2141 02/20/15 0620 02/20/15 1120  GLUCAP 98 96 108*    Wt Readings from Last 3 Encounters:  02/20/15 114.3 kg (251 lb 15.8 oz)  02/20/15 114 kg (251 lb 5.2 oz)  02/07/15 115.667 kg (255 lb)    Physical Exam:  HENT:  Head: Normocephalic.  Eyes: EOM are normal.  Neck: Normal range of motion. Neck supple. No thyromegaly present.  Cardiovascular:  Cardiac rate controlled  Respiratory: Effort normal and breath sounds normal. No respiratory distress.  GI: Soft. Bowel sounds are normal. He exhibits no distension.  Neurological: He is alert.  Oriented to person. Confused. Unaware of where he was. Didn't know if it was night or day. Follows simple commands. 4/5 ue's and 3-4/5 prox to distal in LE's.  Skin:  Wounds clean, well approximated.  Psychiatric: He has a normal mood and affect. His behavior is normal Sensation intact to light touch in both upper and lower limbs Musculoskeletal no pain with range of motion in bilateral upper or lower limbs no joint swelling  noted in the upper or lower limbs  Assessment/Plan: 1. Functional deficits secondary to debility after AI, subsequent AVR which require 3+ hours per day of interdisciplinary therapy in a comprehensive inpatient rehab setting. Physiatrist is providing close team supervision and 24 hour management of active medical problems listed below. Physiatrist and rehab team continue to assess barriers to discharge/monitor patient progress toward functional and medical goals. FIM:                                 Medical Problem List and Plan: 1. Functional deficits secondary to debilitation related to aortic Insufficiency and subsequent AVR 2. DVT Prophylaxis/Anticoagulation: Eliquis 3. Pain Management: Ultram as needed 4. Mood/depression/anxiety: Lamictal 100 mg twice a day, Lexapro 20 mg daily, Klonopin 0.5 mg daily at bedtime, BuSpar 15 mg twice a day, Wellbutrin 300 mg daily. 5. Neuropsych: This patient is not capable of making decisions on his own behalf.  -observe mental status while here (?sundowning)  -recent UA negative but no culture ordered---will re-order today  -may be some baseline components related to hx of TBI as well  6. Skin/Wound Care: Routine skin checks 7. Fluids/Electrolytes/Nutrition: encourage PO 8. Hypertension /Atrial fibrillation. Cardizem 60 mg every 12 hours, Norvasc 5 mg daily. Cardiac rate controlled. 9. Chronic renal insufficiency with baseline creatinine 1.69. Follow-up chemistries are consistent today 10. History of traumatic brain injury with  craniotomy 1960. Independent prior to admission using a cane and/or walker 11. Recent fall with right proximal fibula fracture February 2016. Weightbearing as tolerated 12. Hyperlipidemia. Lipitor 13. Hypothyroidism. Synthroid 14. MRSA PCR screening positive. Contact precautions 15. Acute blood loss anemia. Follow-up CBC 16. Intermittent nausea with history of pancreatitis. Lipase level of 82. Recent  ultrasound of abdomen with mild fatty infiltration of liver otherwise unremarkable. Full liquid diet and advance as tolerated 17.BPH. Continue Avodart. Checking PVR 3     LOS (Days) 1 A FACE TO FACE EVALUATION WAS PERFORMED  SWARTZ,ZACHARY T 02/21/2015 8:52 AM

## 2015-02-21 NOTE — Progress Notes (Signed)
Pt transferred to CIR before CR education completed. Discussed with pt and wife in CIR sternal precautions, IS, and CRPII. Pt voiced understanding and requests his name be sent to [place]. Will send. [phone] Ethelda Chick CES, ACSM 3:04 PM 02/21/2015

## 2015-02-21 NOTE — Progress Notes (Signed)
[facility]patient Rehabilitation Center Individual Statement of Services  Patient Name:  [name] [name]  Date:  02/21/2015  Welcome to the Inpatient Rehabilitation [facility].  Our goal is to provide you with an individualized program based on your diagnosis and situation, designed to meet your specific needs.  With this comprehensive rehabilitation program, you will be expected to participate in at least 3 hours of rehabilitation therapies Monday-Friday, with modified therapy programming on the weekends.  Your rehabilitation program will include the following services:  Physical Therapy (PT), Occupational Therapy (OT), 24 hour per day rehabilitation nursing, Case Management (Social Worker), Rehabilitation Medicine, Nutrition Services and Pharmacy Services  Weekly team conferences will be held on Tuesdays to discuss your progress.  Your Social Worker will talk with you frequently to get your input and to update you on team discussions.  Team conferences with you and your family in attendance may also be held.  Expected length of stay:  10 to 14 days  Overall anticipated outcome:  Supervision  Depending on your progress and recovery, your program may change. Your Social Worker will coordinate services and will keep you informed of any changes. Your Social Worker's name and contact numbers are listed  below.  The following services may also be recommended but are not provided by the Inpatient Rehabilitation Center:   Driving Evaluations  Home Health Rehabiltiation Services  Outpatient Rehabilitation Services  Arrangements will be made to provide these services after discharge if needed.  Arrangements include referral to agencies that provide these services.  Your insurance has been verified to be:  Medicare and BCBS Your primary doctor is:  Dr. Cain Saupe  Pertinent information will be shared with your doctor and your insurance company.  Social Worker:  Staci Acosta, LCSW  [phone] or ([phone]  Information discussed with and copy given to patient by: Elvera Lennox, 02/21/2015, 1:41 PM

## 2015-02-21 NOTE — Progress Notes (Signed)
Social Work Patient ID: [name] [name], male   DOB: 03/21/1932, 81 y.o.   MRN: [id number]   CSW attempted to visit pt x 2 without success.  CSW will try again later to meet pt and complete assessment.

## 2015-02-22 ENCOUNTER — Inpatient Hospital Stay (HOSPITAL_COMMUNITY): Payer: Medicare Other

## 2015-02-22 ENCOUNTER — Inpatient Hospital Stay (HOSPITAL_COMMUNITY): Payer: Medicare Other | Admitting: Physical Therapy

## 2015-02-22 ENCOUNTER — Inpatient Hospital Stay (HOSPITAL_COMMUNITY): Payer: Self-pay

## 2015-02-22 DIAGNOSIS — G8918 Other acute postprocedural pain: Secondary | ICD-10-CM

## 2015-02-22 NOTE — Progress Notes (Signed)
[place] PHYSICAL MEDICINE & REHABILITATION     PROGRESS NOTE    Subjective/Complaints: Remembers me from "day before last," Pt has chest pain with movement or deep breath, "procrastinating on pain meds"   ROS: Pt denies  headache, blurred or double vision, nausea, vomiting, abdominal pain, diarrhea, , shortness of breath, palpitations, dysuria,    Objective: Vital Signs: Blood pressure 157/85, pulse 88, temperature 98.7 F (37.1 C), temperature source Oral, resp. rate 18, height 6' (1.829 m), weight 114.3 kg (251 lb 15.8 oz), SpO2 98 %. No results found.  Recent Labs  02/21/15 0542  WBC 6.4  HGB 9.9*  HCT 30.5*  PLT 311    Recent Labs  02/20/15 0427 02/21/15 0542  NA 138 140  K 3.9 4.4  CL 104 101  GLUCOSE 89 97  BUN 11 10  CREATININE 1.54* 1.66*  CALCIUM 8.8* 9.5   CBG (last 3)   Recent Labs  02/19/15 2141 02/20/15 0620 02/20/15 1120  GLUCAP 98 96 108*    Wt Readings from Last 3 Encounters:  02/20/15 114.3 kg (251 lb 15.8 oz)  02/20/15 114 kg (251 lb 5.2 oz)  02/07/15 115.667 kg (255 lb)    Physical Exam:  HENT:  Head: Normocephalic.  Eyes: EOM are normal.  Neck: Normal range of motion. Neck supple. No thyromegaly present.  Cardiovascular:  Cardiac rate controlled  Respiratory: Effort normal and breath sounds normal. No respiratory distress.  GI: Soft. Bowel sounds are normal. He exhibits no distension.  Neurological: He is alert.  Oriented to person. Confused. Unaware of where he was. Didn't know if it was night or day. Follows simple commands. 4/5 ue's and 3-4/5 prox to distal in LE's.  Skin:  Wounds clean, well approximated.  Psychiatric: He has a normal mood and affect. His behavior is normal Sensation intact to light touch in both upper and lower limbs Musculoskeletal no pain with range of motion in bilateral upper or lower limbs no joint swelling noted in the upper or lower limbs  Assessment/Plan: 1. Functional deficits  secondary to debility after AI, subsequent AVR which require 3+ hours per day of interdisciplinary therapy in a comprehensive inpatient rehab setting. Physiatrist is providing close team supervision and 24 hour management of active medical problems listed below. Physiatrist and rehab team continue to assess barriers to discharge/monitor patient progress toward functional and medical goals. FIM:    FIM - Upper Body Dressing/Undressing Upper body dressing/undressing steps patient completed: Thread/unthread right sleeve of pullover shirt/dresss, Thread/unthread left sleeve of pullover shirt/dress, Put head through opening of pull over shirt/dress, Pull shirt over trunk Upper body dressing/undressing: 5: Set-up assist to: Obtain clothing/put away FIM - Lower Body Dressing/Undressing Lower body dressing/undressing steps patient completed: Thread/unthread right underwear leg, Thread/unthread left underwear leg, Pull pants up/down Lower body dressing/undressing: 2: Max-Patient completed 25-49% of tasks  FIM - Toileting Toileting steps completed by patient: Adjust clothing prior to toileting, Performs perineal hygiene Toileting: 3: Mod-Patient completed 2 of 3 steps  FIM - Diplomatic Services operational officer Devices: Grab bars Toilet Transfers: 4-To toilet/BSC: Min A (steadying Pt. > 75%), 4-From toilet/BSC: Min A (steadying Pt. > 75%)  FIM - Bed/Chair Transfer Bed/Chair Transfer Assistive Devices: Manufacturing systems engineer Transfer: 3: Supine > Sit: Mod A (lifting assist/Pt. 50-74%/lift 2 legs, 3: Sit > Supine: Mod A (lifting assist/Pt. 50-74%/lift 2 legs), 4: Bed > Chair or W/C: Min A (steadying Pt. > 75%), 4: Chair or W/C > Bed: Min A (steadying Pt. > 75%)  FIM - Locomotion: Wheelchair Distance: 40 Locomotion: Wheelchair: 1: Travels less than 50 ft with supervision, cueing or coaxing FIM - Locomotion: Ambulation Locomotion: Ambulation Assistive Devices: Designer, industrial/product Ambulation/Gait  Assistance: 1: +2 Total assist, 4: Min assist Locomotion: Ambulation: 1: Two helpers  Comprehension Comprehension Mode: Auditory Comprehension: 5-Understands basic 90% of the time/requires cueing < 10% of the time  Expression Expression Mode: Verbal Expression: 5-Expresses basic needs/ideas: With extra time/assistive device  Social Interaction Social Interaction: 5-Interacts appropriately 90% of the time - Needs monitoring or encouragement for participation or interaction.  Problem Solving Problem Solving: 4-Solves basic 75 - 89% of the time/requires cueing 10 - 24% of the time  Memory Memory: 3-Recognizes or recalls 50 - 74% of the time/requires cueing 25 - 49% of the time Medical Problem List and Plan: 1. Functional deficits secondary to debilitation related to aortic Insufficiency and subsequent AVR 2. DVT Prophylaxis/Anticoagulation: Eliquis 3. Pain Management: Ultram as needed 4. Mood/depression/anxiety: Lamictal 100 mg twice a day, Lexapro 20 mg daily, Klonopin 0.5 mg daily at bedtime, BuSpar 15 mg twice a day, Wellbutrin 300 mg daily. 5. Neuropsych: This patient is not capable of making decisions on his own behalf.  -observe mental status while here (?sundowning)  -recent UA negative but no culture ordered---will re-order today  -may be some baseline components related to hx of TBI as well  6. Skin/Wound Care: Routine skin checks 7. Fluids/Electrolytes/Nutrition: encourage PO 8. Hypertension /Atrial fibrillation. Cardizem 60 mg every 12 hours, Norvasc 5 mg daily. Cardiac rate controlled. 9. Chronic renal insufficiency with baseline creatinine 1.69. Follow-up chemistries are consistent today 10. History of traumatic brain injury with craniotomy 1960. Independent prior to admission using a cane and/or walker 11. Recent fall with right proximal fibula fracture February 2016. Weightbearing as tolerated 12. Hyperlipidemia. Lipitor 13. Hypothyroidism. Synthroid 14. MRSA PCR  screening positive. Contact precautions 15. Acute blood loss anemia. Follow-up CBC 16. Intermittent nausea with history of pancreatitis. Lipase level of 82. Recent ultrasound of abdomen with mild fatty infiltration of liver otherwise unremarkable. Full liquid diet and advance as tolerated 17.BPH. Continue Avodart. Checking PVR 3     LOS (Days) 2 A FACE TO FACE EVALUATION WAS PERFORMED  Erick Colace 02/22/2015 8:57 AM

## 2015-02-22 NOTE — Progress Notes (Signed)
Occupational Therapy Session Note  Patient Details  Name: [name] [name] MRN: [id number] Date of Birth: July 04, 1932  Today's Date: 02/22/2015 OT Individual Time: 1100-1200 OT Individual Time Calculation (min): 60 min    Short Term Goals: Week 1:  OT Short Term Goal 1 (Week 1): STG=LTG due to short anticipated LOS  Skilled Therapeutic Interventions/Progress Updates: ADL- retraining with focus on transfers, safety awareness, attention, and adherence to sternal precautions.   Pt able to rise from supine to sit, observing sternal precautions w/o cues, with min assist (pt=75%).   Pt rose to stand with min vc to and extra time using rocking motion to generate  momentum however pt rises rapidly and exhibits posterior instability initially.    Pt requested assist to bathroom to toilet and required min guard while ambulating from bed to bathroom.   Pt toileted unassisted and proceeded to shower for bathing with min assist to wash buttocks.   Pt requests assist when needed and attends to safety cues w/o hesitation during session.   Is escorted in w/c from shower to bed to dress at edge of bed.   After setup for clothing, pt requires only steadying assist while standing to don underwear and pants.    Pt does not wear socks and dons shoes laced unassisted after setup to apply TEDs.   Family arrived at end of session and reports on pt's history of falls as relating to animal care (dog, now removed from home), toileting at night (disoriented during falls), and possible Parkinson's dx as investigated by neuro at [facility].   Pt left seated in his w/c with family attending at end of session.     Therapy Documentation Precautions:  Precautions Precautions: Fall, Sternal Restrictions Weight Bearing Restrictions: Yes RLE Weight Bearing: Weight bearing as tolerated  Pain: Pain Assessment No/denies pain  ADL: ADL ADL Comments: see FIM  See FIM for current functional status  Therapy/Group: Individual Therapy    Second session: Time: 1300-1400 Time Calculation (min): 60 min  Pain Assessment: 3/10, left lower breast  Skilled Therapeutic Interventions: ADL-retraining with focus on improved self-feeding, pt/family ed on AE to reduce effects of intention tremor (right hand), therapeutic activity with focus on improved dynamic standing balance using Wii balance board, and therapeutic exercise with focus on improved core/LE strengthening to enhance transfer skills.   Pt received seated with wife present after completing his meal.   OT noted pt had changed shirts and was informed by spouse that pt spilled food on himself during self-feeding.   OT retrieved weighted silverware and provided demo, education, and trial use of weighted fork.   Pt finished self-feeding using fork successfully and declared he would continue using device if allowed.   OT provided consumer AE catalog to pt and wife to further explore resources for private purchase.   Pt was then escorted to day room and participated in initial instruction on use of Wii balance board to challenge dynamic standing balance.   After repeated trials, pt reported fatigue from standing and requested termination of session, with preference to perform isolated LE strengthening while seated.   Pt was escorted to gym and instructed on use of NuStep, where he completed 5 min of therex, level 1, and was escorted back to his room with as physical therapist arrived for continued treatment.   See FIM for current functional status  Therapy/Group: Individual Therapy  Parag Dorton 02/22/2015, 12:17 PM

## 2015-02-22 NOTE — Progress Notes (Signed)
Physical Therapy Session Note  Patient Details  Name: [name] [name] MRN: [id number] Date of Birth: 01-01-1932  Today's Date: 02/22/2015 PT Individual Time: 1400-1530 PT Individual Time Calculation (min): 90 min   Short Term Goals: Week 1:  PT Short Term Goal 1 (Week 1): Pt will demonstrate sit to supine transfer req min A.  PT Short Term Goal 2 (Week 1): Pt will demonstrate ability to complete 5 Time Sit To Stand test PT Short Term Goal 3 (Week 1): Pt will ambulate >= 108' with one person assist.  PT Short Term Goal 4 (Week 1): Pt will self propel w/c with B legs >= 150' with Supervision PT Short Term Goal 5 (Week 1): Pt will ascend/descend 2 stairs with one person assist and handrails for balance  Skilled Therapeutic Interventions/Progress Updates:    Therapeutic Activity: Pt received up in w/c, agreeable to PT. PT instructs pt in repeated w/c to/from bed stand-step transfers without AD req CGA-min A prn and verbal cues for technique (scoot to edge, arms crossed, get your balance in stand before walking) x 5 reps with rest breaks prn.  PT instructs pt in repeated sit to/from R side lie transfer in bed - focus on coordinated movement during sit to side lie to achieve legs on the bed req SBA and verbal cues down, then min A on way up (focus on using weight of legs down off bed in coordinated movement with trunk side flexion from abdominals) x 5 reps each.   Gait Training: PT instructs pt in ambulation with RW and min A x 90' + 100' + 90' req steadying assist for balance. Pt's SaO2 drops to 92% on RA after gait and HR increases to 111 bpm. Pt vitals recover to baseline values with multiple minute seated rest break.  PT instructs pt in ascending/descending a single step (6" height) with B handrails for balance only x 8 reps req min A. HR increases to 95 bpm and SaO2 drops to 95% on RA, but pt recovers within 1 minute.   Therapeutic Exercise: Pt c/o chronic acute upper back pain - PT notes  poor slouched posture. PT instructs pt in upright posture including scapular retraction to neutral and upper thoracic spine extension to neutral: 2 x 10 reps. PT instructs pt in incentive spirometry exercise for improved lung expansion capacity: x 10 reps, achieving 250 mL on best attempt while keeping rate between the arrows.   Pt has very poor lung capacity, as measured on the incentive spirometer. Pt req frequent, prolonged rest breaks throughout therapy session, but participates well. Pt has chronic upper back pain - likely related to poor posture habitually, as well as rounded shoulder posture induced by hugging heart pillow after cardiac surgery. Pt reports pain reduced with postural reeducation exercises to neutral. Continue per PT POC.      Therapy Documentation Precautions:  Precautions Precautions: Fall, Sternal Restrictions Weight Bearing Restrictions: Yes RLE Weight Bearing: Weight bearing as tolerated Pain: Pain Assessment Pain Assessment: 0-10 Pain Score: 4  Pain Type: Acute pain Pain Location: Chest Pain Orientation: Left Pain Descriptors / Indicators: Penetrating Pain Onset: On-going Pain Intervention(s): RN made aware Multiple Pain Sites: Yes 2nd Pain Site Pain Score: 7 Pain Type: Acute pain Pain Location: Back Pain Orientation: Upper;Mid Pain Descriptors / Indicators: Penetrating Pain Onset: On-going Pain Intervention(s): RN made aware   See FIM for current functional status  Therapy/Group: Individual Therapy  Johnanna Bakke M 02/22/2015, 2:11 PM

## 2015-02-23 ENCOUNTER — Inpatient Hospital Stay (HOSPITAL_COMMUNITY): Payer: Self-pay | Admitting: Rehabilitation

## 2015-02-23 LAB — URINE CULTURE: Culture: >=100000

## 2015-02-23 NOTE — Progress Notes (Signed)
Physical Therapy Session Note  Patient Details  Name: [name] [name] MRN: [id number] Date of Birth: 1931/09/09  Today's Date: 02/23/2015 PT Individual Time: 1500-1600 PT Individual Time Calculation (min): 60 min   Short Term Goals: Week 1:  PT Short Term Goal 1 (Week 1): Pt will demonstrate sit to supine transfer req min A.  PT Short Term Goal 2 (Week 1): Pt will demonstrate ability to complete 5 Time Sit To Stand test PT Short Term Goal 3 (Week 1): Pt will ambulate >= 46' with one person assist.  PT Short Term Goal 4 (Week 1): Pt will self propel w/c with B legs >= 150' with Supervision PT Short Term Goal 5 (Week 1): Pt will ascend/descend 2 stairs with one person assist and handrails for balance  Skilled Therapeutic Interventions/Progress Updates:   Pt received lying in bed, agreeable to therapy session.  Pt states being sick over the night and not feeling well today, but was agreeable to get OOB.  Performed bed mobility with HOB elevated and with use of heart pillow to decrease amount of UE use through bed.  Requires min/mod A to fully elevate trunk into sitting.  Transferred to w/c via stand pivot transfer with RW at min/guard level.  Mod cues for hand placement and safety to maintain sternal precautions.  Pt self propelled w/c to therapy gym with BLEs for overall strengthening and activity tolerance.  Tolerated well, vitals WFL following activity.  Once in therapy gym, address gait with varying devices to improve quality and distance as well as challenge balance.  Performed 80' x 3 reps with use of RW at close S level, then without AD at min A level with cues for decreased gait speed for safety then with SPC at min/guard level and continued cues for sequencing and technique with SPC.  Continue to feel that pt safer with RW at this time, but feel he could progress to [facility] with practice.  Progressed to stair negotiation with B handrails ( cues for light UE use) for BLE strengthening.  Tolerated  well with single LOB requiring min A, otherwise close S.   Then performed seated nustep x 10 mins at level 3 resistance with BLEs only for generalized strengthening and endurance.  Tolerated well with single rest break.  Ended session in ADL apt to address bed mobility to better simulate home environment.  Performed x 2 reps with use of heart pillow to maintain sternal precautions.  First rep requiring mod A progressing to min A on second attempt with continued cues for technique and using LEs as momentum/counter force to elevate trunk.  Assisted pt back to room and left in w/c with all needs in reach.   Therapy Documentation Precautions:  Precautions Precautions: Fall, Sternal Restrictions Weight Bearing Restrictions: Yes RLE Weight Bearing: Weight bearing as tolerated   Vital Signs: Therapy Vitals Temp: 98.1 F (36.7 C) Temp Source: Oral Pulse Rate: 87 Resp: 19 BP: (!) 150/94 mmHg Patient Position (if appropriate): Lying Oxygen Therapy SpO2: 93 % O2 Device: Not Delivered Pain: Pain Assessment Pain Assessment: 0-10 Pain Score: Asleep Pain Type: Acute pain Pain Location: Chest Pain Orientation: Mid Pain Descriptors / Indicators: Aching Pain Frequency: Intermittent Patients Stated Pain Goal: 3 Pain Intervention(s): Medication (See eMAR);Repositioned  See FIM for current functional status  Therapy/Group: Individual Therapy  Vista Deck 02/23/2015, 7:59 AM

## 2015-02-23 NOTE — Progress Notes (Addendum)
[place] PHYSICAL MEDICINE & REHABILITATION     PROGRESS NOTE    Subjective/Complaints: Pt states "I was a bit confused yesterday" Oriented to person, place not time "June 2 or 3" CP only with movement   ROS: Pt denies  headache,nausea, vomiting, abdominal pain, diarrhea, , shortness of breath, palpitations, dysuria,    Objective: Vital Signs: Blood pressure 150/94, pulse 87, temperature 98.1 F (36.7 C), temperature source Oral, resp. rate 19, height 6' (1.829 m), weight 114.3 kg (251 lb 15.8 oz), SpO2 93 %. No results found.  Recent Labs  02/21/15 0542  WBC 6.4  HGB 9.9*  HCT 30.5*  PLT 311    Recent Labs  02/21/15 0542  NA 140  K 4.4  CL 101  GLUCOSE 97  BUN 10  CREATININE 1.66*  CALCIUM 9.5   CBG (last 3)   Recent Labs  02/20/15 1120  GLUCAP 108*    Wt Readings from Last 3 Encounters:  02/20/15 114.3 kg (251 lb 15.8 oz)  02/20/15 114 kg (251 lb 5.2 oz)  02/07/15 115.667 kg (255 lb)    Physical Exam:  HENT:  Head: Normocephalic.  Eyes: EOM are normal.  Neck: Normal range of motion. Neck supple. No thyromegaly present.  Cardiovascular:  Cardiac rate controlled  Respiratory: Effort normal and breath sounds normal. No respiratory distress.  GI: Soft. Bowel sounds are normal. He exhibits no distension.  Neurological: He is alert.  Oriented to person. Confused. Unaware of where he was. Didn't know if it was night or day. Follows simple commands. 4/5 ue's and 3-4/5 prox to distal in LE's.  Skin:  Wounds clean, well approximated. BLE , chest drains, sternotomy Psychiatric: He has a normal mood and affect. His behavior is normal Sensation intact to light touch in both upper and lower limbs Musculoskeletal no pain with range of motion in bilateral upper or lower limbs no joint swelling noted in the upper or lower limbs  Assessment/Plan: 1. Functional deficits secondary to debility after AI, subsequent AVR which require 3+ hours per day of  interdisciplinary therapy in a comprehensive inpatient rehab setting. Physiatrist is providing close team supervision and 24 hour management of active medical problems listed below. Physiatrist and rehab team continue to assess barriers to discharge/monitor patient progress toward functional and medical goals. FIM: FIM - Bathing Bathing Steps Patient Completed: Chest, Right Arm, Left Arm, Abdomen, Front perineal area, Right upper leg, Left upper leg Bathing: 3: Mod-Patient completes 5-7 70f 10 parts or 50-74%  FIM - Upper Body Dressing/Undressing Upper body dressing/undressing steps patient completed: Thread/unthread right sleeve of pullover shirt/dresss, Thread/unthread left sleeve of pullover shirt/dress, Put head through opening of pull over shirt/dress, Pull shirt over trunk Upper body dressing/undressing: 5: Set-up assist to: Obtain clothing/put away FIM - Lower Body Dressing/Undressing Lower body dressing/undressing steps patient completed: Thread/unthread right underwear leg, Thread/unthread left underwear leg, Thread/unthread right pants leg, Thread/unthread left pants leg, Pull underwear up/down, Pull pants up/down, Don/Doff right shoe, Don/Doff left shoe Lower body dressing/undressing: 4: Steadying Assist  FIM - Toileting Toileting steps completed by patient: Adjust clothing prior to toileting, Performs perineal hygiene, Adjust clothing after toileting Toileting: 6: Assistive device: No helper  FIM - Diplomatic Services operational officer Devices: Grab bars Toilet Transfers: 4-To toilet/BSC: Min A (steadying Pt. > 75%), 4-From toilet/BSC: Min A (steadying Pt. > 75%)  FIM - Bed/Chair Transfer Bed/Chair Transfer Assistive Devices: HOB elevated Bed/Chair Transfer: 4: Supine > Sit: Min A (steadying Pt. > 75%/lift 1 leg),  5: Sit > Supine: Supervision (verbal cues/safety issues), 4: Bed > Chair or W/C: Min A (steadying Pt. > 75%), 4: Chair or W/C > Bed: Min A (steadying Pt. >  75%)  FIM - Locomotion: Wheelchair Distance: 90 Locomotion: Wheelchair: 2: Travels 50 - 149 ft with supervision, cueing or coaxing FIM - Locomotion: Ambulation Locomotion: Ambulation Assistive Devices: Designer, industrial/product Ambulation/Gait Assistance: 4: Min assist Locomotion: Ambulation: 2: Travels 50 - 149 ft with minimal assistance (Pt.>75%)  Comprehension Comprehension Mode: Auditory Comprehension: 5-Understands basic 90% of the time/requires cueing < 10% of the time  Expression Expression Mode: Verbal Expression: 5-Expresses basic needs/ideas: With extra time/assistive device  Social Interaction Social Interaction: 5-Interacts appropriately 90% of the time - Needs monitoring or encouragement for participation or interaction.  Problem Solving Problem Solving: 4-Solves basic 75 - 89% of the time/requires cueing 10 - 24% of the time  Memory Memory: 3-Recognizes or recalls 50 - 74% of the time/requires cueing 25 - 49% of the time Medical Problem List and Plan: 1. Functional deficits secondary to debilitation related to aortic Insufficiency and subsequent AVR, CAD s/p CABGalso has encephalopathy 2. DVT Prophylaxis/Anticoagulation: Eliquis 3. Pain Management: Ultram as needed 4. Mood/depression/anxiety: Lamictal 100 mg twice a day, Lexapro 20 mg daily, Klonopin 0.5 mg daily at bedtime, BuSpar 15 mg twice a day, Wellbutrin 300 mg daily. 5. Neuropsych: This patient is not capable of making decisions on his own behalf.  -observe mental status while here (?sundowning)  -recent UA negative but no culture ordered---will re-order today  -may be some baseline components related to hx of TBI as well  6. Skin/Wound Care: Routine skin checks 7. Fluids/Electrolytes/Nutrition: encourage PO 8. Hypertension /Atrial fibrillation. Cardizem 60 mg every 12 hours, Norvasc 5 mg daily. Cardiac rate controlled. 9. Chronic renal insufficiency with baseline creatinine 1.69. Follow-up chemistries are  consistent today 10. History of traumatic brain injury with craniotomy 1960. Independent prior to admission using a cane and/or walker 11. Recent fall with right proximal fibula fracture February 2016. Weightbearing as tolerated 12. Hyperlipidemia. Lipitor 13. Hypothyroidism. Synthroid 14. MRSA PCR screening positive. Contact precautions 15. Acute blood loss anemia. Follow-up CBC 16. Intermittent nausea with history of pancreatitis. Lipase level of 82. Recent ultrasound of abdomen with mild fatty infiltration of liver otherwise unremarkable. Full liquid diet and advance as tolerated 17.BPH. Continue Avodart. Checking PVR 3     LOS (Days) 3 A FACE TO FACE EVALUATION WAS PERFORMED  KIRSTEINS,ANDREW E 02/23/2015 8:11 AM

## 2015-02-24 ENCOUNTER — Inpatient Hospital Stay (HOSPITAL_COMMUNITY): Payer: Medicare Other | Admitting: Physical Therapy

## 2015-02-24 ENCOUNTER — Inpatient Hospital Stay (HOSPITAL_COMMUNITY): Payer: Medicare Other

## 2015-02-24 ENCOUNTER — Other Ambulatory Visit: Payer: Self-pay

## 2015-02-24 DIAGNOSIS — N390 Urinary tract infection, site not specified: Secondary | ICD-10-CM

## 2015-02-24 DIAGNOSIS — A499 Bacterial infection, unspecified: Secondary | ICD-10-CM

## 2015-02-24 NOTE — Progress Notes (Signed)
Physical Therapy Session Note  Patient Details  Name: [name] [name] MRN: [id number] Date of Birth: [birth year]  Today's Date: 02/24/2015 PT Individual Time: 0900-1002 and 4098-1191 PT Individual Time Calculation (min): 62 min and 54 min  Short Term Goals: Week 1:  PT Short Term Goal 1 (Week 1): Pt will demonstrate sit to supine transfer req min A.  PT Short Term Goal 2 (Week 1): Pt will demonstrate ability to complete 5 Time Sit To Stand test PT Short Term Goal 3 (Week 1): Pt will ambulate >= 53' with one person assist.  PT Short Term Goal 4 (Week 1): Pt will self propel w/c with B legs >= 150' with Supervision PT Short Term Goal 5 (Week 1): Pt will ascend/descend 2 stairs with one person assist and handrails for balance  Skilled Therapeutic Interventions/Progress Updates:   Pt received in w/c at sink finishing grooming tasks.  While pt performed tasks seated in w/c at sink, this PT discussed and confirmed D/C plan, home set up, assistance and equipment available at D/C.  Confirmed that he lives with his wife in 2 level town home with threshold to enter; main level bed/bath but his computer is upstairs.  He states he has considered having movers move the desk and computer to main level.  Pt states his wife is present most of the time each day but can only provide supervision.  He also states that the AD/equipment a home was his wife's and he does not have his own.  Pt provided directions from room > gym but due to impaired short term memory/recall of new information pt unable to find way to gym and required total verbal cues from therapist for navigation.  Performed w/c mobility to gym with bilat LE propulsion and supervision.  Performed BERG balance assessment and 5x sit to stand; see below for details.  Falls risk and AD recommendation discussed with pt.  Returned to room in w/c and pt transferred w/c > bed stand pivot with min guard but required mod A to bring LE into bed in sidelying.  Pt left  in bed to rest with all items within reach.    PM session: Pt received sitting EOB with wife present.  Discussed with wife and pt ELOS and possible D/C date at end of week.  Pt and wife feel comfortable with that date.  Pt performed transfer and ambulation from bed > sink with RW and min A with one episode of R knee buckling.  At sink pt required mod A for appropriate sequencing of hand hygiene (pt put soap on hands but then attempted to wipe soap off with cloth and then dry hands with same cloth).  Performed w/c mobility with LE propulsion supervision with pt recalling route to gym this time.  Performed stair negotiation training for home in case of pt having to negotiate flight to second level.  Performed up/down 4 stairs x 3 reps with one UE support on rail with min A and verbal cues for each repetition for safe step to sequence to minimize R knee buckling and provide more safety and stability.  Provided pt with handout of OTAGO HEP and had pt return demonstrate each exercises with bilat UE support at tall counter with intermittent seated rest breaks.  Performed gait x 100' with RW back to room with min A and verbal cues for widened BOS and assistance for balance/stability on RLE during changes in direction.  Returned to bed and to supine with min-mod A.  Pt left with wife present and all items within reach.    Therapy Documentation Precautions:  Precautions Precautions: Fall, Sternal Restrictions Weight Bearing Restrictions: Yes RLE Weight Bearing: Weight bearing as tolerated Vital Signs: Therapy Vitals Pulse Rate: 74 Oxygen Therapy SpO2: 95 % Pain: Pain Assessment Pain Assessment: No/denies pain Pain Score: 3  Pain Type: Surgical pain Pain Location: Chest Pain Orientation: Mid Pain Onset: Progressive Patients Stated Pain Goal: 1 Pain Intervention(s): RN made aware;Distraction;Shower Multiple Pain Sites: No Locomotion : Wheelchair Mobility Distance: 100   Balance: Standardized  Balance Assessment Standardized Balance Assessment: Hospital doctorBerg Balance Test Berg Balance Test Sit to Stand: Able to stand without using hands and stabilize independently Standing Unsupported: Able to stand safely 2 minutes Sitting with Back Unsupported but Feet Supported on Floor or Stool: Able to sit safely and securely 2 minutes Stand to Sit: Sits safely with minimal use of hands Transfers: Able to transfer safely, minor use of hands Standing Unsupported with Eyes Closed: Able to stand 10 seconds with supervision Standing Ubsupported with Feet Together: Able to place feet together independently and stand for 1 minute with supervision From Standing, Reach Forward with Outstretched Arm: Can reach forward >12 cm safely (5") From Standing Position, Pick up Object from Floor: Able to pick up shoe, needs supervision From Standing Position, Turn to Look Behind Over each Shoulder: Needs supervision when turning Turn 360 Degrees: Needs close supervision or verbal cueing Standing Unsupported, Alternately Place Feet on Step/Stool: Able to complete >2 steps/needs minimal assist Standing Unsupported, One Foot in Front: Loses balance while stepping or standing Standing on One Leg: Tries to lift leg/unable to hold 3 seconds but remains standing independently Total Score: 36  Patient demonstrates increased fall risk as noted by score of 36/56 on Berg Balance Scale.  (<36= high risk for falls, close to 100%; 37-45 significant >80%; 46-51 moderate >50%; 52-55 lower >25%)  Five times Sit to Stand Test (FTSS) Method: Use a straight back chair with a solid seat that is 16-18" high. Ask participant to sit on the chair with arms folded across their chest.   Instructions: "Stand up and sit down as quickly as possible 5 times, keeping your arms folded across your chest."   Measurement: Stop timing when the participant stands the 5th time.  TIME: __13____ (in seconds) but did not come to full stand each time and pt  would use back of legs on chair to stabilize; recommending re-test tomorrow when pt not fatigued at end of session.  Times > 13.6 seconds is associated with increased disability and morbidity (Guralnik, 2000) Times > 15 seconds is predictive of recurrent falls in healthy individuals aged 79 and older (Buatois, et al., 2008) Normal performance values in community dwelling individuals aged 79 and older (Bohannon, 2006): o 60-69 years: 11.4 seconds o 70-79 years: 12.6 seconds o 80-89 years: 14.8 seconds  See FIM for current functional status  Therapy/Group: Individual Therapy  Edman CircleHall, Johnay Mano [facility] CenterFaucette 02/24/2015, 12:07 PM

## 2015-02-24 NOTE — Progress Notes (Signed)
[place] PHYSICAL MEDICINE & REHABILITATION     PROGRESS NOTE    Subjective/Complaints: Had a good night. Feels that his head clearing up. No problems overnight   ROS: Pt denies  headache,nausea, vomiting, abdominal pain, diarrhea, , shortness of breath, palpitations, dysuria,    Objective: Vital Signs: Blood pressure 138/83, pulse 87, temperature 98.5 F (36.9 C), temperature source Oral, resp. rate 18, height 6' (1.829 m), weight 114.3 kg (251 lb 15.8 oz), SpO2 93 %. No results found. No results for input(s): WBC, HGB, HCT, PLT in the last 72 hours. No results for input(s): NA, K, CL, GLUCOSE, BUN, CREATININE, CALCIUM in the last 72 hours.  Invalid input(s): CO CBG (last 3)  No results for input(s): GLUCAP in the last 72 hours.  Wt Readings from Last 3 Encounters:  02/20/15 114.3 kg (251 lb 15.8 oz)  02/20/15 114 kg (251 lb 5.2 oz)  02/07/15 115.667 kg (255 lb)    Physical Exam:  HENT:  Head: Normocephalic.  Eyes: EOM are normal.  Neck: Normal range of motion. Neck supple. No thyromegaly present.  Cardiovascular:  Cardiac rate controlled  Respiratory: Effort normal and breath sounds normal. No respiratory distress.  GI: Soft. Bowel sounds are normal. He exhibits no distension.  Neurological: He is alert.  Oriented to person, place, month. Follows simple commands. 4/5 ue's and 3-4/5 prox to distal in LE's.  Skin:  Wounds clean, well approximated. BLE , chest drains, sternotomy Psychiatric: He has a normal mood and affect. His behavior is normal Sensation intact to light touch in both upper and lower limbs Musculoskeletal no pain with range of motion in bilateral upper or lower limbs no joint swelling noted in the upper or lower limbs  Assessment/Plan: 1. Functional deficits secondary to debility after AI, subsequent AVR which require 3+ hours per day of interdisciplinary therapy in a comprehensive inpatient rehab setting. Physiatrist is providing close team  supervision and 24 hour management of active medical problems listed below. Physiatrist and rehab team continue to assess barriers to discharge/monitor patient progress toward functional and medical goals. FIM: FIM - Bathing Bathing Steps Patient Completed: Chest, Right Arm, Left Arm, Abdomen, Front perineal area, Right upper leg, Left upper leg Bathing: 3: Mod-Patient completes 5-7 652f 10 parts or 50-74%  FIM - Upper Body Dressing/Undressing Upper body dressing/undressing steps patient completed: Thread/unthread right sleeve of pullover shirt/dresss, Thread/unthread left sleeve of pullover shirt/dress, Put head through opening of pull over shirt/dress, Pull shirt over trunk Upper body dressing/undressing: 5: Set-up assist to: Obtain clothing/put away FIM - Lower Body Dressing/Undressing Lower body dressing/undressing steps patient completed: Thread/unthread right underwear leg, Thread/unthread left underwear leg, Thread/unthread right pants leg, Thread/unthread left pants leg, Pull underwear up/down, Pull pants up/down, Don/Doff right shoe, Don/Doff left shoe Lower body dressing/undressing: 4: Steadying Assist  FIM - Toileting Toileting steps completed by patient: Adjust clothing prior to toileting, Performs perineal hygiene, Adjust clothing after toileting Toileting: 5: Supervision: Safety issues/verbal cues  FIM - Diplomatic Services operational officerToilet Transfers Toilet Transfers Assistive Devices: Grab bars Toilet Transfers: 4-To toilet/BSC: Min A (steadying Pt. > 75%), 4-From toilet/BSC: Min A (steadying Pt. > 75%)  FIM - Bed/Chair Transfer Bed/Chair Transfer Assistive Devices: HOB elevated Bed/Chair Transfer: 3: Supine > Sit: Mod A (lifting assist/Pt. 50-74%/lift 2 legs, 4: Chair or W/C > Bed: Min A (steadying Pt. > 75%), 4: Bed > Chair or W/C: Min A (steadying Pt. > 75%), 5: Sit > Supine: Supervision (verbal cues/safety issues)  FIM - Locomotion: Wheelchair Distance: 80  Locomotion: Wheelchair: 2: Travels 50 - 149  ft with supervision, cueing or coaxing FIM - Locomotion: Ambulation Locomotion: Ambulation Assistive Devices: Designer, industrial/product Ambulation/Gait Assistance: 4: Min guard, 5: Supervision Locomotion: Ambulation: 2: Travels 50 - 149 ft with minimal assistance (Pt.>75%)  Comprehension Comprehension Mode: Auditory Comprehension: 4-Understands basic 75 - 89% of the time/requires cueing 10 - 24% of the time  Expression Expression Mode: Verbal Expression: 5-Expresses basic needs/ideas: With extra time/assistive device  Social Interaction Social Interaction: 5-Interacts appropriately 90% of the time - Needs monitoring or encouragement for participation or interaction.  Problem Solving Problem Solving: 4-Solves basic 75 - 89% of the time/requires cueing 10 - 24% of the time  Memory Memory: 3-Recognizes or recalls 50 - 74% of the time/requires cueing 25 - 49% of the time Medical Problem List and Plan: 1. Functional deficits secondary to debilitation related to aortic Insufficiency and subsequent AVR, CAD s/p CABGalso has encephalopathy 2. DVT Prophylaxis/Anticoagulation: Eliquis 3. Pain Management: Ultram as needed 4. Mood/depression/anxiety: Lamictal 100 mg twice a day, Lexapro 20 mg daily, Klonopin 0.5 mg daily at bedtime, BuSpar 15 mg twice a day, Wellbutrin 300 mg daily. 5. Neuropsych: This patient is not capable of making decisions on his own behalf.  -AMS likely due to UTI--rx  -may be some baseline components related to hx of TBI as well  6. Skin/Wound Care: Routine skin checks 7. Fluids/Electrolytes/Nutrition: encourage PO 8. Hypertension /Atrial fibrillation. Cardizem 60 mg every 12 hours, Norvasc 5 mg daily. Cardiac rate controlled. 9. Chronic renal insufficiency with baseline creatinine 1.69. Follow-up chemistries are consistent today 10. History of traumatic brain injury with craniotomy 1960. Independent prior to admission using a cane and/or walker 11. Recent fall with right  proximal fibula fracture February 2016. Weightbearing as tolerated 12. Hyperlipidemia. Lipitor 13. Hypothyroidism. Synthroid 14. MRSA PCR screening positive. Contact precautions 15. Acute blood loss anemia. Follow-up CBC 16. Intermittent nausea with history of pancreatitis. Lipase level of 82. Recent ultrasound of abdomen with mild fatty infiltration of liver otherwise unremarkable. Full liquid diet and advance as tolerated 17.BPH/Uro: Continue Avodart.     -rx pseudomonas UTI with cipro  bid for 5 days     LOS (Days) 4 A FACE TO FACE EVALUATION WAS PERFORMED  Maeson Purohit T 02/24/2015 8:38 AM

## 2015-02-24 NOTE — Progress Notes (Addendum)
Occupational Therapy Session Note  Patient Details  Name: SI SNIDER MRN: [id number] Date of Birth: [birth year]  Today's Date: 02/24/2015 OT Individual Time: 0730-0900 OT Individual Time Calculation (min): 90 min    Short Term Goals: Week 1:  OT Short Term Goal 1 (Week 1): STG=LTG due to short anticipated LOS  Skilled Therapeutic Interventions/Progress Updates: ADL-retraining with focus on sit<>stand, reinforcement of AE use for self-feeding and bathing, adherence to sternal precautions, energy conservation, and improved anticipatory awareness. Pt received supine in bed, awake and alert, requesting setup for self-feeding prior to bathing/dressing/grooming.   With setup of tray pt sat at EOB and used his weighted fork to complete self-feeding w/o evidence of food spills this date.  Pt required 3 attempts to stand from EOB to walker and mod vc for technique to maintain precautions with observed LOB 1 time, posteriorly back to edge of bed.   Pt rose with steadying assist and ambulated to bathroom to toilet  unassisted, voiding only urine, and then proceeded to  shower level bathing after min guard assist from toilet to shower chair.  Pt instructed and provided with a LH sponge to wash his feet and back this session.   Pt dressed at edge of bed, standing to don underwear and pants, with setup to apply TEDs and only steadying assist (min guard) while pulling up pants at RW.   Pt instructed on use of LH shoe horn and elastic laces this date to improve efficiency with donning shoes.    Pt groomed at sink and remained at sink until arrival of physical therapist at end of OT session.    Pt endorsed need for shower chair only to use in his walk-in shower.      Therapy Documentation Precautions:  Precautions Precautions: Fall, Sternal Restrictions Weight Bearing Restrictions: Yes RLE Weight Bearing: Weight bearing as tolerated  Vital Signs: Therapy Vitals Temp: 98.5 F (36.9 C) Temp Source:  Oral Pulse Rate: 87 Resp: 18 BP: 138/83 mmHg Patient Position (if appropriate): Lying Oxygen Therapy SpO2: 93 % O2 Device: Not Delivered   Pain: Pain Assessment Pain Assessment: 0-10 Pain Score: 3  Pain Type: Surgical pain Pain Location: Chest Pain Orientation: Mid Pain Onset: Progressive Patients Stated Pain Goal: 1 Pain Intervention(s): RN made aware;Distraction;Shower Multiple Pain Sites: No  ADL: ADL ADL Comments: see FIM  See FIM for current functional status  Therapy/Group: Individual Therapy  Maritza Goldsborough 02/24/2015, 8:55 AM

## 2015-02-24 NOTE — Patient Instructions (Signed)
Therapeutic - Forward Lunge   Bring one leg forward with both knees bent, shifting weight onto heel of foot in front. Repeat ____ times. Repeat with other leg.  Copyright  VHI. All rights reserved.  Balance: Unilateral - Forward Lean   Stand on left foot, hands on hips. Keeping hips level, bend forward as if to touch forehead to wall. Hold ____ seconds. Relax. Repeat ____ times per set. Do ____ sets per session. Do ____ sessions per day.  http://orth.exer.us/88   Copyright  VHI. All rights reserved.  Balance: Three-Way Leg Swing   Stand on left foot, hands on hips. Reach other foot forward ____ times, sideways ____ times, back ____ times. Hold each position ____ seconds. Relax. Repeat ____ times per set. Do ____ sets per session. Do ____ sessions per day.  http://orth.exer.us/86   Copyright  VHI. All rights reserved.  Balance / Reach   Stand on left foot, Holding ____ pound weight in other hand. Bend knee, lowering body, and reach across. Hold ____ seconds. Relax. Repeat ____ times per set. Do ____ sets per session. Do ____ sessions per day.  http://orth.exer.us/90   Copyright  VHI. All rights reserved.  Cardiac Rehabilitation Cardiac rehabilitation is a medically supervised program that helps improve the health and well-being of people with heart problems. Cardiac rehabilitation includes exercise training, education, and counseling to help you get stronger and return to an active lifestyle. People who participate in cardiac rehabilitation programs get better faster and reduce future hospital stays. Cardiac rehabilitation programs can help when you have had the following conditions:  Heart attack.  Heart failure.  Peripheral artery disease.  Coronary artery disease.  Angina.  Lung or breathing problems. Cardiac rehabilitation programs are also used when you have the following procedures:  Coronary artery bypass graft surgery.  Heart valve replacement.  Heart  stent placement.  Heart transplant.  Aneurysm repair. CARDIAC REHABILITATION MAY HELP YOU:  Reduce problems like chest pain and trouble breathing.  Change risk factors that contribute to heart disease, such as:  Smoking.  High blood pressure.  High cholesterol.  Diabetes.  Being out of shape or not active.  Weighing more than 30% over your ideal weight.  Diet.  Improve your mental outlook so you feel:  Less depressed or "blue."  More hopeful.  Better about yourself.  More confident about taking care of yourself.  Get support from health experts as well as other people with similar problems.  Learn how to manage and understand your medicines.  Teach your family about your condition and how to participate in your recovery. WHAT HAPPENS IN CARDIAC REHABILITATION? You will be assessed by a cardiac rehabilitation team. They will check your health history and do a physical exam. You may need blood tests, stress tests, and other evaluations. You may not start a cardiac rehabilitation program if:  You develop angina with exercise or while at rest.  You have severe heart failure that limits your activity.  You have an abnormal heart rhythm at rest.  You develop heart rhythm problems during exercise.  You have high blood pressure that is not controlled. The cardiac rehabilitation team works with you to make a plan based on your health and goals. Everyone is unique, so each program is customized and your program may change as you progress. Members of a typical cardiac rehabilitation team may include such health professionals as:  Doctors.  Nurses.  Dietitians.  Psychologists.  Exercise specialists.  Physical and occupational therapists. A typical cardiac  rehabilitation program is divided into phases. You advance from one phase to the next. Most cardiac rehabilitation sessions last for 60 minutes, 3 times a week.  Phase One starts while you are still in the  hospital. You may start by walking in your room and then in the hall. You may start some simple exercises with a therapist. Health care team members will give you information and ask you many questions. You may not be able to remember details, so have a family member or an advocate with you to help keep track of information.  Phase Two begins when you go home or to another facility. This phase may last 8 to 12 weeks. You will travel to a cardiac rehabilitation center or a place where it is offered. Typically, you gradually increase your activity while being closely watched by a nurse or therapist. Exercises may be a combination of strength or resistance training and "cardio" or aerobic movement on a treadmill or other machines. Your condition will determine how often and how long these sessions will last.  In phase two, you may learn how to cook healthy meals, control your blood sugar, and manage your medicines. You may need help with scheduling or planning how and when to take your medicines. Use a timer, divided pill box, or follow a form to make taking your medicines easier. Use the method that works best for you. Some medicines should not be taken with certain foods. If you take more than one blood pressure medicine, you may need to stagger the times you take them. Taking all your blood pressure medicine at the same time may lower your blood pressure too much. If you have questions about your medicines, ask your health care provider questions until you understand.  Phase Three continues for the rest of your life. There will be less supervision. You may still participate in cardiac rehabilitation activities or become part of a group in your community. You may benefit from talking to other people about your experience if they are facing similar challenges. How soon you drive, have sex, or return to work will depend on your condition. These decisions should be made by you and your health care provider. If you  need help, ask for it. Find out where you can get the help you need. Ask questions until you get answers and understand. SEEK IMMEDIATE MEDICAL CARE IF:  Get medical help at once if you experience any of the following symptoms:  Severe chest discomfort, especially if the pain is crushing or pressure-like and spreads to the arms, back, neck, or jaw. Do not wait to see if the pain will go away.  Weakness or numbness in your face, arms, or legs, especially on one side of the body; slurred speech; confusion; sudden severe headache or loss of vision (all symptoms of stroke).  You have shortness of breath.  You are sweating and feel sick to your stomach (nausea).  You feel dizzy or faint.  You experience profound tiredness (fatigue). Call your local emergency service (911 in the [place] Do not drive yourself to the hospital. Document Released: 05/25/2008 Document Revised: 12/31/2013 Document Reviewed: 11/20/2010 [facility] Patient Information 2015 [place], [place]. This information is not intended to replace advice given to you by your health care provider. Make sure you discuss any questions you have with your health care provider.

## 2015-02-25 ENCOUNTER — Inpatient Hospital Stay (HOSPITAL_COMMUNITY): Payer: Medicare Other

## 2015-02-25 ENCOUNTER — Inpatient Hospital Stay (HOSPITAL_COMMUNITY): Payer: Self-pay | Admitting: Physical Therapy

## 2015-02-25 ENCOUNTER — Inpatient Hospital Stay (HOSPITAL_COMMUNITY): Payer: Medicare Other | Admitting: Physical Therapy

## 2015-02-25 ENCOUNTER — Inpatient Hospital Stay (HOSPITAL_COMMUNITY): Payer: Self-pay

## 2015-02-25 ENCOUNTER — Other Ambulatory Visit: Payer: Self-pay | Admitting: Adult Health

## 2015-02-25 MED ORDER — SENNOSIDES-DOCUSATE SODIUM 8.6-50 MG PO TABS
2.0000 | ORAL_TABLET | Freq: Two times a day (BID) | ORAL | Status: DC
  Administered 2015-02-25 – 2015-03-01 (×8): 2 via ORAL
  Filled 2015-02-25 (×8): qty 2

## 2015-02-25 MED ORDER — CIPROFLOXACIN HCL 250 MG PO TABS
250.0000 mg | ORAL_TABLET | Freq: Two times a day (BID) | ORAL | Status: AC
  Administered 2015-02-25 – 2015-02-27 (×6): 250 mg via ORAL
  Filled 2015-02-25 (×8): qty 1

## 2015-02-25 NOTE — Progress Notes (Signed)
Physical Therapy Session Note  Patient Details  Name: [name] [name] MRN: [id number] Date of Birth: 1932-03-30  Today's Date: 02/25/2015 PT Individual Time:1000-1028 and [id number]  PT Individual Time Calculation (min): 28 and 25 min   Short Term Goals: Week 1:  PT Short Term Goal 1 (Week 1): Pt will demonstrate sit to supine transfer req min A.  PT Short Term Goal 2 (Week 1): Pt will demonstrate ability to complete 5 Time Sit To Stand test PT Short Term Goal 3 (Week 1): Pt will ambulate >= 8775' with one person assist.  PT Short Term Goal 4 (Week 1): Pt will self propel w/c with B legs >= 150' with Supervision PT Short Term Goal 5 (Week 1): Pt will ascend/descend 2 stairs with one person assist and handrails for balance  Skilled Therapeutic Interventions/Progress Updates:  AM session: Pt received in w/c.  While pt performing w/c mobility to ortho gym with bilat foot propulsion pt reporting visual disturbance this am when he first awakened.  He reports he has this visual distortion intermittently but it can last for 5 minutes > 1 hour.  He describes it as having a small portion of central vision only and the rest of his visual field being distorted with random shapes and colors.  Pt reports he has seen an ophthalmologist but has never been diagnosed with any type of visual impairment.  Pt reporting distortion now resolved but feels very fatigued today. Prior to performing car transfer vitals assessed, WFL.  Discussed driving restriction with pt; pt aware and states that his son will more than likely drive him home and to appointments.  Demonstrated to pt simulated van transfer and had pt return demonstrate; required min A for balance while pivoting to car but able to place LE into and out of "Zenaida Niecevan" with supervision. Returned to room and pt requesting to rest in bed for one hour before OT.  Transferred stand pivot to bed supervision and sit > sidelying supervision.    PM session: Pt still resting in  bed and had only eaten cake from his lunch tray.  Pt very lethargic and required increased time and encouragement to transition to EOB.  Performed sidelying > sit supervision and discussed sequence for home entry/exit at threshold.  Demonstrated to pt multiple ways to cross the threshold with the RW and had pt return demonstrate each method.  Pt felt that picking the RW up all the way over the threshold did strain his chest; pt to sequentially enter house by placing front wheels over threshold, step closer to door, place back legs into house and then step over threshold into house to minimize strain on chest.  Pt returned demonstrated twice.  Performed backwards and lateral walking with RW and min A with verbal cues for safety and sequencing.  Once pt seated EOB had discussion with pt and wife about son's concerns about D/C date.  Discussed supervision goals and wife only needing to provide supervision at home.  Wife is concerned with how pt is performing bathing and dressing.  Encouraged wife to begin observe therapy sessions and begin education with PT and OT.  Wife agreeable.  Performed sit > sidelying supervision.  Encouraged pt to begin to spend more time OOB to prevent further deconditioning; pt agreeable to get OOB later in the pm.    Therapy Documentation Precautions:  Precautions Precautions: Fall, Sternal Restrictions Weight Bearing Restrictions: Yes RLE Weight Bearing: Weight bearing as tolerated Vital Signs: Therapy Vitals Temp: 98 F (  36.7 C) Temp Source: Oral Pulse Rate: 83 Resp: 17 BP: 120/68 mmHg Patient Position (if appropriate): Lying Oxygen Therapy SpO2: 95 % O2 Device: Not Delivered Pain: Pain Assessment Pain Assessment: No/denies pain Locomotion : Ambulation Ambulation/Gait Assistance: 4: Min assist Wheelchair Mobility Distance: 150   See FIM for current functional status  Therapy/Group: Individual Therapy  [facility] 02/25/2015, 2:43 PM

## 2015-02-25 NOTE — Progress Notes (Signed)
Occupational Therapy Session Note  Patient Details  Name: Rupert StacksChrysler R Santoro MRN: [id number] Date of Birth: 1932-06-17  Today's Date: 02/25/2015 OT Individual Time: 0830-1000 OT Individual Time Calculation (min): 90 min   Short Term Goals: Week 1:  OT Short Term Goal 1 (Week 1): STG=LTG due to short anticipated LOS  Skilled Therapeutic Interventions/Progress Updates: ADL-retraining with focus on bed mobility, toileting, grooming, lower body dressing, energy conservation.   Pt elected to bathe a sink during this session after using the toilet.   From supine in bed, pt rose to EOB unassisted, donned socks using sock aid with min assist, and ambulated to bathroom with standby assist after setup to provide RW. Pt toileted unassisted and c/o constipation d/t unproductive for BM; RN notified.    Pt ambulated to w/c at sink and with setup assist and dressed at EOB using LH shoe horn and elastic laces.  No LOB noted from sit<>stand while donning underwear and pants.   Pt completed dressing and was educated on stages of cardiac rehab.   OT advised pt to visit Cardiac Rehab Program on ground floor to appreciate context of outpatient treatment.   Pt was escorted to Cardiac Rehab Program (CRP) in w/c and ambulated approx 50'  within  CRP, asking staff several questions relating treatment.   Pt continues to state he plans to drive his car despite reiteration from staff to wait for MD approval before drving d/t risk of catastrophic injury from even a minor accident.   Pt returned to his w/c and was escorted back to his room to remain in w/c while awaiting continued treatment.     Therapy Documentation Precautions:  Precautions Precautions: Fall, Sternal Restrictions Weight Bearing Restrictions: Yes RLE Weight Bearing: Weight bearing as tolerated   Pain: 3/10, left lower chest.     ADL: ADL ADL Comments: see FIM    See FIM for current functional status  Therapy/Group: Individual Therapy   Second  session: Time: 1130-1200 Time Calculation (min):  30 min  Pain Assessment: No/denies pain  Skilled Therapeutic Interventions: Therapeutic activity with focus on family ed Clydie Braun(Karen, friend who drives pt's wife), home safety, performance of HEP.   Pt received dozing in bed with family and friend present.  Pt rose to sit at EOB using HOB elevated and demo'd compliance with sternal precaution [facility]   Pt elected to walk to w/c at doorway w/o device and was then escorted to gym to complete light general conditioning exercise using NuStep.   Pt completed task and was escorted back to his room.   With OT providing supervision and setup for safety, pt demo'd all his exercises provided by physical therapist as family friend Clydie BraunKaren observed.   Pt returned to bed at end of session but required min assist to lift leg back into bed d/t fatigue.   Exercises: NuStep, level 3,  Legs only, 10 min, BORG = 12       See FIM for current functional status  Therapy/Group: Individual Therapy  Vidya Bamford 02/25/2015, 11:26 AM

## 2015-02-25 NOTE — Progress Notes (Signed)
Patient information entered into eRehab system by Arelia Longestebecca Windsor, PT on 02/21/15 and reviewed by Tora DuckMarie Cordale Manera, RN, CRRN, PPS Coordinator.  Information including medical coding and functional independence measure will be reviewed and updated through discharge.     Per nursing patient was given "Data Collection Information Summary for Patients in Inpatient Rehabilitation Facilities with attached "Privacy Act Statement-Health Care Records" upon admission.

## 2015-02-25 NOTE — Progress Notes (Signed)
PHYSICAL MEDICINE & REHABILITATION     PROGRESS NOTE    Subjective/Complaints: Improving mentation. Able to sleep.denies any urinary symptoms.    ROS: Pt denies  headache,nausea, vomiting, abdominal pain, diarrhea, , shortness of breath, palpitations, dysuria,    Objective: Vital Signs: Blood pressure 147/81, pulse 85, temperature 98 F (36.7 C), temperature source Oral, resp. rate 18, height 6' (1.829 m), weight 114.3 kg (251 lb 15.8 oz), SpO2 97 %. No results found. No results for input(s): WBC, HGB, HCT, PLT in the last 72 hours. No results for input(s): NA, K, CL, GLUCOSE, BUN, CREATININE, CALCIUM in the last 72 hours.  Invalid input(s): CO CBG (last 3)  No results for input(s): GLUCAP in the last 72 hours.  Wt Readings from Last 3 Encounters:  02/20/15 114.3 kg (251 lb 15.8 oz)  02/20/15 114 kg (251 lb 5.2 oz)  02/07/15 115.667 kg (255 lb)    Physical Exam:  HENT:  Head: Normocephalic.  Eyes: EOM are normal.  Neck: Normal range of motion. Neck supple. No thyromegaly present.  Cardiovascular:  Cardiac rate controlled. Regular rhythm   Respiratory: Effort normal and breath sounds normal. No respiratory distress.  GI: Soft. Bowel sounds are normal. He exhibits no distension.  Neurological: He is alert.  Oriented to person, place, month. Follows simple commands. 4/5 ue's and 3-4/5 prox to distal in LE's.  Skin:  Wounds clean, well approximated along chest/legs---no drainage Psychiatric: He has a normal mood and affect. His behavior is normal Sensation intact to light touch in both upper and lower limbs Musculoskeletal no pain with range of motion in bilateral upper or lower limbs no joint swelling noted in the upper or lower limbs  Assessment/Plan: 1. Functional deficits secondary to debility after AI, subsequent AVR which require 3+ hours per day of interdisciplinary therapy in a comprehensive inpatient rehab setting. Physiatrist is providing close  team supervision and 24 hour management of active medical problems listed below. Physiatrist and rehab team continue to assess barriers to discharge/monitor patient progress toward functional and medical goals. FIM: FIM - Bathing Bathing Steps Patient Completed: Chest, Right Arm, Left Arm, Abdomen, Front perineal area, Buttocks, Right upper leg, Right lower leg (including foot), Left upper leg, Left lower leg (including foot) Bathing: 6: Assistive device (Comment) (LH sponge)  FIM - Upper Body Dressing/Undressing Upper body dressing/undressing steps patient completed: Thread/unthread right sleeve of pullover shirt/dresss, Thread/unthread left sleeve of pullover shirt/dress, Put head through opening of pull over shirt/dress, Pull shirt over trunk Upper body dressing/undressing: 5: Set-up assist to: Obtain clothing/put away FIM - Lower Body Dressing/Undressing Lower body dressing/undressing steps patient completed: Thread/unthread right underwear leg, Thread/unthread left underwear leg, Pull underwear up/down, Thread/unthread right pants leg, Thread/unthread left pants leg, Pull pants up/down, Don/Doff right shoe, Don/Doff left shoe, Fasten/unfasten right shoe, Fasten/unfasten left shoe, Don/Doff right sock, Don/Doff left sock, Fasten/unfasten pants (using elastic laces and LH shoe horn) Lower body dressing/undressing: 4: Steadying Assist  FIM - Toileting Toileting steps completed by patient: Adjust clothing prior to toileting, Performs perineal hygiene, Adjust clothing after toileting Toileting: 5: Supervision: Safety issues/verbal cues  FIM - Diplomatic Services operational officerToilet Transfers Toilet Transfers Assistive Devices: Art gallery managerWalker Toilet Transfers: 5-To toilet/BSC: Supervision (verbal cues/safety issues), 5-From toilet/BSC: Supervision (verbal cues/safety issues)  FIM - BankerBed/Chair Transfer Bed/Chair Transfer Assistive Devices: Therapist, occupationalWalker Bed/Chair Transfer: 4: Bed > Chair or W/C: Min A (steadying Pt. > 75%), 4: Chair or W/C >  Bed: Min A (steadying Pt. > 75%), 3: Sit > Supine: Mod  A (lifting assist/Pt. 50-74%/lift 2 legs)  FIM - Locomotion: Wheelchair Distance: 100 Locomotion: Wheelchair: 5: Travels 150 ft or more: maneuvers on rugs and over door sills with supervision, cueing or coaxing FIM - Locomotion: Ambulation Locomotion: Ambulation Assistive Devices: Designer, industrial/product Ambulation/Gait Assistance: 4: Min assist Locomotion: Ambulation: 2: Travels 50 - 149 ft with minimal assistance (Pt.>75%)  Comprehension Comprehension Mode: Auditory Comprehension: 5-Understands basic 90% of the time/requires cueing < 10% of the time  Expression Expression Mode: Verbal Expression: 5-Expresses basic needs/ideas: With no assist  Social Interaction Social Interaction: 6-Interacts appropriately with others with medication or extra time (anti-anxiety, antidepressant).  Problem Solving Problem Solving: 5-Solves basic 90% of the time/requires cueing < 10% of the time  Memory Memory: 5-Recognizes or recalls 90% of the time/requires cueing < 10% of the time Medical Problem List and Plan: 1. Functional deficits secondary to debilitation related to aortic Insufficiency and subsequent AVR, CAD s/p CABGalso has encephalopathy 2. DVT Prophylaxis/Anticoagulation: Eliquis 3. Pain Management: Ultram as needed 4. Mood/depression/anxiety: Lamictal 100 mg twice a day, Lexapro 20 mg daily, Klonopin 0.5 mg daily at bedtime, BuSpar 15 mg twice a day, Wellbutrin 300 mg daily. 5. Neuropsych: This patient is not capable of making decisions on his own behalf.  -AMS likely due to UTI--rx  -may be some baseline components related to hx of TBI as well  6. Skin/Wound Care: Routine skin checks 7. Fluids/Electrolytes/Nutrition: intake appears solid 8. Hypertension /Atrial fibrillation. Cardizem 60 mg every 12 hours, Norvasc 5 mg daily. Cardiac rate controlled. 9. Chronic renal insufficiency with baseline creatinine 1.69. (recent labwork  consistent)  10. History of traumatic brain injury with craniotomy 1960. Independent prior to admission using a cane and/or walker 11. Recent fall with right proximal fibula fracture February 2016. No wb restrictions 12. Hyperlipidemia. Lipitor 13. Hypothyroidism. Synthroid 14. MRSA PCR screening positive. Contact precautions to continue 15. Acute blood loss anemia. Follow-up CBC tomorrow 16. Intermittent nausea with history of pancreatitis. Lipase level of 82. Recent ultrasound of abdomen with mild fatty infiltration of liver otherwise unremarkable. On soft diet without issues  -recent LFT's normal. 17.BPH/Uro: Continue Avodart.     -rx pseudomonas UTI with cipro  bid for 3 days (given recent associated AMS)     LOS (Days) 5 A FACE TO FACE EVALUATION WAS PERFORMED  [name] [name] T 02/25/2015 8:16 AM

## 2015-02-26 ENCOUNTER — Inpatient Hospital Stay (HOSPITAL_COMMUNITY): Payer: Medicare Other

## 2015-02-26 ENCOUNTER — Encounter (HOSPITAL_COMMUNITY): Payer: Self-pay

## 2015-02-26 ENCOUNTER — Inpatient Hospital Stay (HOSPITAL_COMMUNITY): Payer: Medicare Other | Admitting: Physical Therapy

## 2015-02-26 ENCOUNTER — Inpatient Hospital Stay (HOSPITAL_COMMUNITY): Payer: Self-pay

## 2015-02-26 LAB — CBC
HCT: 33.2 % — ABNORMAL LOW (ref 39.0–52.0)
Hemoglobin: 10.7 g/dL — ABNORMAL LOW (ref 13.0–17.0)
MCH: 30.7 pg (ref 26.0–34.0)
MCHC: 32.2 g/dL (ref 30.0–36.0)
MCV: 95.4 fL (ref 78.0–100.0)
Platelets: 369 10*3/uL (ref 150–400)
RBC: 3.48 MIL/uL — ABNORMAL LOW (ref 4.22–5.81)
RDW: 16 % — AB (ref 11.5–15.5)
WBC: 7.6 10*3/uL (ref 4.0–10.5)

## 2015-02-26 LAB — BASIC METABOLIC PANEL
Anion gap: 11 (ref 5–15)
BUN: 14 mg/dL (ref 6–20)
CO2: 26 mmol/L (ref 22–32)
Calcium: 9 mg/dL (ref 8.9–10.3)
Chloride: 101 mmol/L (ref 101–111)
Creatinine, Ser: 1.66 mg/dL — ABNORMAL HIGH (ref 0.61–1.24)
GFR, EST AFRICAN AMERICAN: 43 mL/min — AB (ref >60)
GFR, EST NON AFRICAN AMERICAN: 37 mL/min — AB (ref >60)
Glucose, Bld: 92 mg/dL (ref 65–99)
POTASSIUM: 3.7 mmol/L (ref 3.5–5.1)
Sodium: 138 mmol/L (ref 135–145)

## 2015-02-26 MED ORDER — TRAZODONE HCL 50 MG PO TABS
50.0000 mg | ORAL_TABLET | Freq: Every evening | ORAL | Status: DC | PRN
  Administered 2015-02-26 – 2015-02-28 (×3): 50 mg via ORAL
  Filled 2015-02-26 (×3): qty 1

## 2015-02-26 NOTE — Progress Notes (Addendum)
Physical Therapy Session Note  Patient Details  Name: [name] [name] MRN: [id number] Date of Birth: [birth year]  Today's Date: 02/26/2015 PT Individual Time:  -  Treatment Session 1: 1000-1100 Treatment Session 2: [id number] Treatment Session 2: 60 min Treatment Session 2: 55 min     Short Term Goals: Week 1:  PT Short Term Goal 1 (Week 1): Pt will demonstrate sit to supine transfer req min A.  PT Short Term Goal 2 (Week 1): Pt will demonstrate ability to complete 5 Time Sit To Stand test PT Short Term Goal 3 (Week 1): Pt will ambulate >= 175' with one person assist.  PT Short Term Goal 4 (Week 1): Pt will self propel w/c with B legs >= 150' with Supervision PT Short Term Goal 5 (Week 1): Pt will ascend/descend 2 stairs with one person assist and handrails for balance  Skilled Therapeutic Interventions/Progress Updates:    Treatment Session 1: Therapeutic Activity: Pt received in bed, c/o chest pain (surgical) and calls RN to ask for pain meds. Pt req verbal cues/Sup to roll onto R side and min A at trunk for R side lie to sit due to sternal precautions and not pushing with arms. Once in sit, HR measured and extremely labile: 34-164 bpm - pt sat on eob until HR stabilized to 80's/90's. Pt then reports he has to have a BM. PT instructs pt in toilet transfer with RW req SBA on/off standard toilet. Pt self manages pants and hygiene - reaching around to wipe bottom despite PT cues to do a wipe-through method from the front. Pt transfers off toilet, is in hallway to begin ambulation, then reports he needs to use the bathroom again due to "stomach churning" a lot.   Gait Training: PT instructs pt in ambulation with RW x 200' req SBA for safety - no LOB, typical BOS, verbal cues to listen to his body and when he needs a break to notify PT so she can lock the w/c. Pt desaturates to 93% on RA, but recovers with seated rest break.   Treatment Session 2: Gait Training: PT instructs pt in ambulation  with RW x 100' req SBA for safety.  PT instructs pt in ascending 4 (6" height) stairs with B rails req CGA-min A for balance, progressing to a full flight of corner stairs with B handrails req CGA-min A for balance. SaO2 maintains at 98% or higher on RA and HR increases to 99 bpm with this activity.   Therapeutic Exercise: PT instructs pt in incentive spirometry exercise with significantly improved technique from last week x 10 reps. Pt is able to achieve 2375 ml on best attempt.  PT instructs pt in B LE ROM and strengthening exercises at mat level: ankle pumps, heel slides, supine hip abduction/adduction, hip IR/ER in hook lie, bridges: x 10 reps each side.  PT instructs pt in supine diaphragmatic breathing and basal expansion breathing exercises x 10 reps each.   Neuromuscular Reeducation: PT instructs pt in sitting postural reeducation activities eom: chin tucks, spine extension to neutral, scapular retraction to neutral, shoulder rolls front to back: x 10 reps each.   PT instructed pt to use RW at home and pt reports he will not. PT explains that pt has had numerous falls previously and needs to use the RW to prevent falls and pt says he will use it if he feels he needs it. PT offered for pt to receive a transport chair for pt to participate in community activities  with son's assist and pt declines this, but agreeing to obtain a RW for home use. Pt's balance and mobility are improving, but pt continues to req physical assist for side lie to sit transfer if he is to follow sternal precautions and not push with arms against bed. Continue per PT POC.    Therapy Documentation Precautions:  Precautions Precautions: Fall, Sternal Restrictions Weight Bearing Restrictions: Yes (sternal precaution) RLE Weight Bearing: Weight bearing as tolerated Pain: Pain Assessment Pain Assessment: 0-10 Pain Score: 6  Pain Type: Surgical pain Pain Location: Chest Pain Orientation: Mid Pain Descriptors /  Indicators: Aching Pain Onset: On-going Pain Intervention(s): RN made aware Multiple Pain Sites: No Treatment Session 2: Pt c/o 4/10 pain in sternum along surgical incision. PT provides rest and emotional support to decrease pt's pain.   See FIM for current functional status  Therapy/Group: Individual Therapy  [name] [name] M 02/26/2015, 8:47 AM

## 2015-02-26 NOTE — Progress Notes (Signed)
Inpatient RehabilitationTeam Conference and Plan of Care Update Date: 02/25/2015   Time: 2:05 PM    Patient Name: [name] [name]      Medical Record Number: [id number]  Date of Birth: [birth year] Sex: Male         Room/Bed: 4W16C/4W16C-01 Payor Info: Payor: MEDICARE / Plan: MEDICARE PART A AND B / Product Type: *No Product type* /    Admitting Diagnosis: Deconditioned avr no Slp  Admit Date/Time:  02/20/2015  6:27 PM Admission Comments: No comment available   Primary Diagnosis:  <principal problem not specified> Principal Problem: <principal problem not specified>  Patient Active Problem List   Diagnosis Date Noted  . Debilitated 02/20/2015  . S/P AVR 02/11/2015  . Ataxia 12/05/2014  . Abnormality of gait 12/05/2014  . Neck pain, chronic 12/05/2014  . Brisk deep tendon reflexes 12/05/2014  . Abnormal carotid duplex scan 10/28/2014  . OSA (obstructive sleep apnea) 10/28/2014  . Mood disorder with psychosis 10/28/2014  . Episodes of formed visual hallucinations 10/16/2014  . Faintness   . Syncope 10/15/2014  . Acute on chronic renal failure 10/15/2014  . Right fibular fracture 10/15/2014  . Hypoxia 10/15/2014  . Syncope and collapse 10/15/2014  . CAD (coronary artery disease), native coronary artery 09/29/2014  . Falls frequently 09/29/2014  . Hypertension   . Hypercholesteremia   . Aortic valve insufficiency   . Depression   . Atrial fibrillation, chronic 06/20/2014  . Hypothyroidism 06/20/2014  . DOE (dyspnea on exertion) 06/20/2014  . Cough 04/29/2014  . Shortness of breath 04/29/2014    Expected Discharge Date: Expected Discharge Date: 03/01/15  Team Members Present: Physician leading conference: Dr. Faith Rogue Social Worker Present: Staci Acosta, LCSW Nurse Present: Carmie End, RN PT Present: Karolee Stamps, Varney Biles, PT OT Present: Donzetta Kohut, OT;Other (comment) Johnsie Cancel, OT) SLP Present: Feliberto Gottron, SLP PPS Coordinator present : Tora Duck, RN, CRRN     Current Status/Progress Goal Weekly Team Focus  Medical   debility related to AVS s/p AVR. improving confusion  improve safety/stamina  mentation, urinary tract treatment, cv mgt   Bowel/Bladder   continent of bowel and bladder LBM 6/29         Swallow/Nutrition/ Hydration             ADL's   Supervision for transfers, upper body dressing and transfers, min A lower body dressing  Supervision bathing/dressing, transfers, dynamic standing  Improved safety awareness, increased endurance, dynamic stanidng balance   Mobility   min A with intermittent mod A for sit > supine  supervision overall  activity tolerance/endurance, balance, gait   Communication             Safety/Cognition/ Behavioral Observations            Pain   Ultram q 4 hours PRN  continue to assess pain prior to therapy sessions  pain <4   Skin   Mid sternal incision and bil LE incisions  Educate s/s of infection, and assess incision q shift  Continue to monitor healing of incisions    Rehab Goals Patient on target to meet rehab goals: Yes Rehab Goals Revised: none *See Care Plan and progress notes for long and short-term goals.  Barriers to Discharge: safety awareness, stamina    Possible Resolutions to Barriers:  pacing, supervision goals    Discharge Planning/Teaching Needs:  Pt plans to go home to his home with his wife to provide supervision only.  Son will come by to  check on parents and may be interested in hiring private duty caregivers for a few hours a day for them.  Pt's wife has worked with therapists for family education.  CSW invited son to observe therapies as he is able.   Team Discussion:  Pt is debilitated after AVR and most likely was failure to thrive PTA with old brain injury from the 101960s.  Pt's mental status is improving and confusion is clearing.  Pt is MRSA positive and being treated for UTI.  Pt is emptying bladder well and RN to increase senna to 2 twice a day to  help with constipation.  PT is encouraging him to stay in the recliner in between therapies to try to increase stamina/endurance.  OT reports pt's awareness is poor and he thinks he can drive at d/c and plans to not use the walker.  Pt has DME needs and HH f/u.  Revisions to Treatment Plan:  none   Continued Need for Acute Rehabilitation Level of Care: The patient requires daily medical management by a physician with specialized training in physical medicine and rehabilitation for the following conditions: Daily direction of a multidisciplinary physical rehabilitation program to ensure safe treatment while eliciting the highest outcome that is of practical value to the patient.: Yes Daily medical management of patient stability for increased activity during participation in an intensive rehabilitation regime.: Yes Daily analysis of laboratory values and/or radiology reports with any subsequent need for medication adjustment of medical intervention for : Cardiac problems;Post surgical problems;Neurological problems;Other  Damontae Loppnow, Vista DeckJennifer Capps 02/27/2015, 11:01 AM

## 2015-02-26 NOTE — Progress Notes (Signed)
Occupational Therapy Session Note  Patient Details  Name: [name] [name] MRN: [id number] Date of Birth: 10-28-1931  Today's Date: 02/26/2015 OT Individual Time: 1433-1500 OT Individual Time Calculation (min): 27 min    Short Term Goals: Week 1:  OT Short Term Goal 1 (Week 1): STG=LTG due to short anticipated LOS  Skilled Therapeutic Interventions/Progress Updates:    Pt seen for 1:1 OT session with focus on functional endurance, strengthening, and functional transfers. Pt received supine in bed. Completed supine>sit with min A and min cues for adherence to precautions. Pt donned shoes with setup assist and use of LH shoe horn. Pt completed stand pivot transfer bed>w/c at SBA level. Pt propelled self to therapy gym with BLEs for functional endurance. Pt initiating NuStep therefore completed for 8 min with workload of 2 to increase BLE strengthening and functional endurance. Pt required 3 rest breaks during activity d/t elevated HR to 108. HR decreasing to [facility]FL immediately during rest breaks. SpO2 WFL throughout session. Pt returned to room and left sitting in w/c with all needs in reach.   Therapy Documentation Precautions:  Precautions Precautions: Fall, Sternal Restrictions Weight Bearing Restrictions: Yes (sternal precautions) RLE Weight Bearing: Weight bearing as tolerated General:   Vital Signs: Therapy Vitals Temp: 97.5 F (36.4 C) Temp Source: Oral Pulse Rate: 81 Resp: 18 BP: 134/89 mmHg Patient Position (if appropriate): Lying Oxygen Therapy SpO2: 99 % O2 Device: Not Delivered Pain: No report of pain  See FIM for current functional status  Therapy/Group: Individual Therapy  Daneil Danerkinson, Anhar Mcdermott N 02/26/2015, 4:20 PM

## 2015-02-26 NOTE — Progress Notes (Signed)
Social Work Patient ID: [name] [name], male   DOB: 01-08-1932, 81 y.o.   MRN: [id number]   CSW met with pt and spoke to his son, [name] [name], via telephone to provide update on team conference discussion.  CSW spoke with son about criteria for determining d/c date and explained that it is expected that pt will meet his supervision level goals by Saturday, July 2nd.  Son expressed understanding, yet is concerned if his mother can manage pt at home.  CSW suggested son come for family education prior to d/c to talk with therapists and see what pt is doing in therapies.  CSW also mentioned that maybe hiring a caregiver for a couple hours a day would be helpful to both of his parents.  Rob liked that idea and asked CSW for a list of private duty agencies.  CSW left this in pt's room.  Pt is pleased with planned d/c date and feels ready.  CSW to f/u with pt/family after family education tomorrow.

## 2015-02-26 NOTE — Progress Notes (Signed)
Occupational Therapy Session Note  Patient Details  Name: [name] StacksChrysler R Mcnelly MRN: [id number] Date of Birth: 1931-12-22  Today's Date: 02/26/2015 OT Individual Time: 0700-0800 OT Individual Time Calculation (min): 60 min    Short Term Goals: Week 1:  OT Short Term Goal 1 (Week 1): STG=LTG due to short anticipated LOS  Skilled Therapeutic Interventions/Progress Updates:    Pt asleep in bed upon arrival and agreeable to therapy. Pt stated he had been awake since 0130 and just wanted to wash up at the sink and change clothing.  Pt stated that he "missed" his urinal the night before and had to have bed linens, etc changed.  Pt verbalized sternal precautions but required min verbal cues to adhere when engaged in functional task.  Pt required steady A when standing for LB dressing and bathing.  Pt required more than reasonable amount of time to complete tasks and multiple rest breaks.  Focus on activity tolerance, sit<>stand, standing balance, and safety awareness.  Therapy Documentation Precautions:  Precautions Precautions: Fall, Sternal Restrictions Weight Bearing Restrictions: Yes (sternal precaution) RLE Weight Bearing: Weight bearing as tolerated   Pain: Pt denied pain  ADL: ADL ADL Comments: see FIM  See FIM for current functional status  Therapy/Group: Individual Therapy  Rich BraveLanier, Brenda Samano Chappell 02/26/2015, 8:00 AM

## 2015-02-26 NOTE — Progress Notes (Signed)
PHYSICAL MEDICINE & REHABILITATION     PROGRESS NOTE    Subjective/Complaints: Had difficulties sleeping last night. States he's been up since 0130. Denies any pain. States nurse told him he ddin't have any medication for sleep    ROS: Pt denies  headache,nausea, vomiting, abdominal pain, diarrhea, , shortness of breath, palpitations, dysuria,    Objective: Vital Signs: Blood pressure 144/94, pulse 82, temperature 97.9 F (36.6 C), temperature source Oral, resp. rate 18, height 6' (1.829 m), weight 114.3 kg (251 lb 15.8 oz), SpO2 94 %. No results found.  Recent Labs  02/26/15 0600  WBC 7.6  HGB 10.7*  HCT 33.2*  PLT 369    Recent Labs  02/26/15 0600  NA 138  K 3.7  CL 101  GLUCOSE 92  BUN 14  CREATININE 1.66*  CALCIUM 9.0   CBG (last 3)  No results for input(s): GLUCAP in the last 72 hours.  Wt Readings from Last 3 Encounters:  02/20/15 114.3 kg (251 lb 15.8 oz)  02/20/15 114 kg (251 lb 5.2 oz)  02/07/15 115.667 kg (255 lb)    Physical Exam:  HENT:  Head: Normocephalic.  Eyes: EOM are normal.  Neck: Normal range of motion. Neck supple. No thyromegaly present.  Cardiovascular:  Cardiac rate controlled. Regular rhythm   Respiratory: Effort normal and breath sounds normal. No wheezes or rales GI: Soft. Bowel sounds are normal. He exhibits no distension.  Neurological: He is alert.  Oriented to person, place, month. Follows simple commands. 4/5 ue's and 4- to 4/5 prox to distal in LE's.  Skin:  Wounds clean, well approximated along chest/legs---no drainage Psychiatric: He has a normal mood and affect. His behavior is normal Sensation intact to light touch in both upper and lower limbs Musculoskeletal no pain with range of motion in bilateral upper or lower limbs no joint swelling noted in the upper or lower limbs Psych: pleasant and cooperative. A little distracted this am  Assessment/Plan: 1. Functional deficits secondary to debility  after AI, subsequent AVR which require 3+ hours per day of interdisciplinary therapy in a comprehensive inpatient rehab setting. Physiatrist is providing close team supervision and 24 hour management of active medical problems listed below. Physiatrist and rehab team continue to assess barriers to discharge/monitor patient progress toward functional and medical goals. FIM: FIM - Bathing Bathing Steps Patient Completed: Chest, Right Arm, Left Arm, Abdomen, Front perineal area, Buttocks, Right upper leg, Right lower leg (including foot), Left upper leg, Left lower leg (including foot) Bathing: 0: Activity did not occur  FIM - Upper Body Dressing/Undressing Upper body dressing/undressing steps patient completed: Thread/unthread right sleeve of pullover shirt/dresss, Thread/unthread left sleeve of pullover shirt/dress, Put head through opening of pull over shirt/dress, Pull shirt over trunk Upper body dressing/undressing: 5: Supervision: Safety issues/verbal cues FIM - Lower Body Dressing/Undressing Lower body dressing/undressing steps patient completed: Thread/unthread right underwear leg, Thread/unthread left underwear leg, Pull underwear up/down, Thread/unthread right pants leg, Thread/unthread left pants leg, Pull pants up/down, Don/Doff right sock, Don/Doff left sock, Fasten/unfasten pants Lower body dressing/undressing: 4: Steadying Assist  FIM - Toileting Toileting steps completed by patient: Adjust clothing prior to toileting, Performs perineal hygiene, Adjust clothing after toileting Toileting: 5: Supervision: Safety issues/verbal cues  FIM - Diplomatic Services operational officerToilet Transfers Toilet Transfers Assistive Devices: Art gallery managerWalker Toilet Transfers: 5-To toilet/BSC: Supervision (verbal cues/safety issues), 5-From toilet/BSC: Supervision (verbal cues/safety issues)  FIM - BankerBed/Chair Transfer Bed/Chair Transfer Assistive Devices: Manufacturing systems engineerWalker Bed/Chair Transfer: 4: Supine > Sit: Min A (steadying Pt. >  75%/lift 1 leg), 5: Bed  > Chair or W/C: Supervision (verbal cues/safety issues)  FIM - Locomotion: Wheelchair Distance: 150 Locomotion: Wheelchair: 5: Travels 150 ft or more: maneuvers on rugs and over door sills with supervision, cueing or coaxing FIM - Locomotion: Ambulation Locomotion: Ambulation Assistive Devices: Designer, industrial/product Ambulation/Gait Assistance: 4: Min assist Locomotion: Ambulation: 1: Travels less than 50 ft with minimal assistance (Pt.>75%)  Comprehension Comprehension Mode: Auditory Comprehension: 5-Understands basic 90% of the time/requires cueing < 10% of the time  Expression Expression Mode: Verbal Expression: 5-Expresses basic needs/ideas: With no assist  Social Interaction Social Interaction: 6-Interacts appropriately with others with medication or extra time (anti-anxiety, antidepressant).  Problem Solving Problem Solving: 5-Solves basic 90% of the time/requires cueing < 10% of the time  Memory Memory: 5-Recognizes or recalls 90% of the time/requires cueing < 10% of the time Medical Problem List and Plan: 1. Functional deficits secondary to debilitation related to aortic Insufficiency and subsequent AVR, CAD s/p CABGalso has encephalopathy 2. DVT Prophylaxis/Anticoagulation: Eliquis 3. Pain Management: Ultram as needed 4. Mood/depression/anxiety: Lamictal 100 mg twice a day, Lexapro 20 mg daily, Klonopin 0.5 mg daily at bedtime, BuSpar 15 mg twice a day, Wellbutrin 300 mg daily. 5. Neuropsych: This patient is not capable of making decisions on his own behalf.  -AMS likely due to UTI in context of other issues/prior TBI---improving  -add low dose trazodone for sleep 6. Skin/Wound Care: Routine skin checks 7. Fluids/Electrolytes/Nutrition: intake appears solid 8. Hypertension /Atrial fibrillation. Cardizem 60 mg every 12 hours, Norvasc 5 mg daily. Cardiac rate controlled. 9. Chronic renal insufficiency: Cr near baseline --1.66 today. Labs reviewed 10. History of traumatic brain  injury with craniotomy 1960. Independent prior to admission using a cane and/or walker 11. Recent fall with right proximal fibula fracture February 2016. No wb restrictions 12. Hyperlipidemia. Lipitor 13. Hypothyroidism. Synthroid 14. MRSA PCR screening positive. Contact precautions to continue 15. Acute blood loss anemia. hgb up to 10.7 today 16. Intermittent nausea with history of pancreatitis. Lipase level of 82. Recent ultrasound of abdomen with mild fatty infiltration of liver otherwise unremarkable. On soft diet without issues  -recent LFT's normal. 17.BPH/Uro: Continue Avodart.     -rx pseudomonas UTI with cipro  bid for 3 days       LOS (Days) 6 A FACE TO FACE EVALUATION WAS PERFORMED  Shanoah Asbill T 02/26/2015 8:19 AM

## 2015-02-27 ENCOUNTER — Inpatient Hospital Stay (HOSPITAL_COMMUNITY): Payer: Medicare Other

## 2015-02-27 ENCOUNTER — Inpatient Hospital Stay (HOSPITAL_COMMUNITY): Payer: Medicare Other | Admitting: Physical Therapy

## 2015-02-27 NOTE — Progress Notes (Signed)
Physical Therapy Session Note  Patient Details  Name: [name] [name] MRN: [id number] Date of Birth: 1931/10/12  Today's Date: 02/27/2015 PT Individual Time:  -  [phone] Treatment Session 2: 1430-1500 Treatment Session 1: 60 min Treatment Session 2: 30 min     Short Term Goals: Week 1:  PT Short Term Goal 1 (Week 1): Pt will demonstrate sit to supine transfer req min A.  PT Short Term Goal 2 (Week 1): Pt will demonstrate ability to complete 5 Time Sit To Stand test PT Short Term Goal 3 (Week 1): Pt will ambulate >= 54' with one person assist.  PT Short Term Goal 4 (Week 1): Pt will self propel w/c with B legs >= 150' with Supervision PT Short Term Goal 5 (Week 1): Pt will ascend/descend 2 stairs with one person assist and handrails for balance  Skilled Therapeutic Interventions/Progress Updates:    Treatment Session 1: Therapeutic Activity: Pt received up in recliner, takes 2 attempts to achieve sitto stand with SBA and then step transfer to w/c. PT instructs pt in multiple sit to stands throughout tx, occasionally req 2 attempts to achieve stand, and verbal cues to increase lean forward. PT instructs pt that if it hurts his chest to lock/unlock brakes, then to have someone else such as PT or RN do it for him; pt verbalizes understanding.   Gait Training: PT instructs pt in ambulation with RW x > 300' req SBA for safety - occasional narrow BOS but no LOB req assist to correct. SaO2 maintains >= 95% on RA after gait; HR increases to 100 bpm and returns to baseline with rest.  PT instructs pt in ascending/descending 1 corner flight of stairs with B handrails for balance only req CGA.   Neuromuscular Reeducation: PT instructs pt in [place] exercises for strengthening & balance training: squats, standing knee flexion, standing hip abduction, heel/toe raises, and LAQ x 10 reps each req SBA and verbal cues for technique/safety. PT gives pt written program and writes safety & training parameters  on program.   Treatment Session 2: Therapeutic Exercise: PT instructs pt on incentive spirometer exercise x 10 reps and pt achieves 2000 ml.  PT instructs pt in bed level exercises: clam shells, side lie hip abduction x 10 reps to L leg only (pt doesn't lie on his L side due to pain)  Therapeutic Activity: PT instructs pt in w/c to bed transfer req min A with one LOB due to foot stubbing on armrail.  PT instructs pt in blocked practice of sit to R side lie and supine to sit (via R side lie transfer) req Supervision when lying down and min A when sitting up. PT sets up a wedge made of multiple pillows and pt req Supervision progressing to mod I when lying down and supervision when sitting up (min A on last rep due to fatigue)  x 5 reps of blocked practice.   Pt is slowly progressing with PT. Pt reports that he will likely not use RW at d/c due to house being small. PT asks pt to please have son take pictures of home with his phone in order to show this PT, so that she can offer home safety tips/recommendations, which will increase the liklihood pt can use RW at home. Pt agrees to do so. PT discusses options for pt to get up oob without breaking sternal precautions: have wife or son assist at back, sleep on a wedge of pillows, or to sleep in a recliner (with  another person working the lever), and pt agrees to make a pillow wedge for home use. Continue per PT POC.    Therapy Documentation Precautions:  Precautions Precautions: Fall, Sternal Restrictions Weight Bearing Restrictions: Yes (sternal precaution) RLE Weight Bearing: Weight bearing as tolerated Pain: Pain Assessment Pain Assessment: 0-10 Pain Score: 8  Pain Type: Acute pain;Surgical pain Pain Location: Chest Pain Orientation: Mid;Lower Pain Descriptors / Indicators: Aching Pain Onset: On-going Pain Intervention(s): Medication (See eMAR);Repositioned Multiple Pain Sites: Yes 2nd Pain Site Pain Score: 8 Pain Type: Acute  pain Pain Location: Back Pain Descriptors / Indicators: Aching Pain Onset: On-going Pain Intervention(s): Other (Comment) (postural exercises) Treatment Session 2: Pt c/o 3/10 pain at rest, which increases to 8/10 pain with activity in sternum & upper back. PT offers to notify RN, but pt does not want pain meds. PT offers emotional support to pt.   See FIM for current functional status  Therapy/Group: Individual Therapy  [name] [name] M 02/27/2015, 8:31 AM

## 2015-02-27 NOTE — Progress Notes (Signed)
Occupational Therapy Session Note  Patient Details  Name: [name] [name] MRN: [id number] Date of Birth: [birth year]  Today's Date: 02/27/2015 OT Individual Time: [id number] OT Individual Time Calculation (min): 58 min   Short Term Goals: Week 1:  OT Short Term Goal 1 (Week 1): STG=LTG due to short anticipated LOS  Skilled Therapeutic Interventions/Progress Updates: ADL-retraining with focus on energy conservation, re-ed on safety precautions, toileting, and bathing/dressing/grooming at sink.   Pt now able to bathe and dress consistently unassisted after setup with min vc for safety and adherence to sternal precautions.    Pt uses reacher, elastic shoe laces, and LH shoe horn for dressing, weighted silverware for self-feeding.    Pt reports no complaints or concerns relating to planned discharge on 02/28/15.   OT reiterated precaution to not drive his car or walk w/o use of RW.   Pt remained in w/c at end of session with all needs within reach.    Therapy Documentation Precautions:  Precautions Precautions: Fall, Sternal Restrictions Weight Bearing Restrictions: Yes (sternal precautions) RLE Weight Bearing: Weight bearing as tolerated   Pain: Pain Assessment Pain Assessment: 0-10 Pain Score: 3  Pain Type: Surgical pain Pain Location: Chest  ADL: ADL ADL Comments: see FIM   See FIM for current functional status  Therapy/Group: Individual Therapy   Second session: Time: 1100-1155 Time Calculation (min):  55 min  Pain Assessment: No/denies pain  Skilled Therapeutic Interventions: ADL-retraining with focus on family ed (son, wife, and family friend Clydie BraunKaren attending).   With family present, OT educated pt's wife on assisted bed mobility (now intermittent), reinforcing safety precautions, and supervision/setup to conserve energy.   Wife was able to assist pt up from supine to sit with min assist (pt=95%).   Pt demo'd ability to perform step into shower using simulator with supervision  and rise from low couch.   OT educated family on benefit of using furniture risers and hand shower to minimize fall risks.   OT confirmed with pt's wife plan to provide both a shower chair and bedside commode (requested by pt this date).   See FIM for current functional status  Therapy/Group: Individual Therapy  [name] [name] 02/27/2015, 12:00 PM

## 2015-02-27 NOTE — Progress Notes (Signed)
Social Work Discharge Note  The overall goal for the admission was met for:   Discharge location: Yes - to his home with his wife who can provide 24/7 supervision  Length of Stay: Yes - 9 days  Discharge activity level: Yes - supervision  Home/community participation: Yes  Services provided included: MD, RD, PT, OT, RN, Pharmacy and SW  Financial Services: Medicare and Private Insurance: [place]  Follow-up services arranged: Home Health: PT/OT, DME: wide rolling walker; shower chair; bedside commode and Patient/Family request agency HH: [facility], DME: [facility]  Comments (or additional information):  Pt's family (wife, son, and family friend) attended family education with therapists and feel prepared to provide the care pt needs at home.  Pt at supervision level at d/c.  CSW gave son private duty list at his request.  CSW also arranged [place] and ordered necessary DME.  No other needs identified at this time.  Patient/Family verbalized understanding of follow-up arrangements: Yes  Individual responsible for coordination of the follow-up plan: pt's wife and son  Confirmed correct DME delivered: Trey Sailors 02/27/2015    Prevatt, Silvestre Mesi

## 2015-02-27 NOTE — Discharge Instructions (Signed)
Inpatient Rehab Discharge Instructions  [name] [name] [name] Discharge date and time: No discharge date for patient encounter.   Activities/Precautions/ Functional Status: Activity: activity as tolerated Diet: regular diet Wound Care: none needed Functional status:  ___ No restrictions     ___ Walk up steps independently ___ 24/7 supervision/assistance   ___ Walk up steps with assistance ___ Intermittent supervision/assistance  ___ Bathe/dress independently ___ Walk with walker     ___ Bathe/dress with assistance ___ Walk Independently    ___ Shower independently _x__ Walk with assistance    ___ Shower with assistance ___ No alcohol     ___ Return to work/school ________  COMMUNITY REFERRALS UPON DISCHARGE:   Home Health:   PT     OT   Agency:  [facility] Phone:  [phone] Medical Equipment/Items Ordered:  Tub seat; wide rolling walker; bedside commode  Agency/Supplier:  [facility]          Phone:  [phone]  Special Instructions:    My questions have been answered and I understand these instructions. I will adhere to these goals and the provided educational materials after my discharge from the hospital.  Patient/Caregiver Signature _______________________________ Date __________  Clinician Signature _______________________________________ Date __________  Please bring this form and your medication list with you to all your follow-up doctor's appointments.

## 2015-02-27 NOTE — Progress Notes (Signed)
[facility] PHYSICAL MEDICINE & REHABILITATION     PROGRESS NOTE    Subjective/Complaints: Slept a little better last night. Up with OT already this am.  ROS: Pt denies  headache,nausea, vomiting, abdominal pain, diarrhea, , shortness of breath, palpitations, dysuria,    Objective: Vital Signs: Blood pressure 133/81, pulse 78, temperature 98.4 F (36.9 C), temperature source Oral, resp. rate 18, height 6' (1.829 m), weight 115.9 kg (255 lb 8.2 oz), SpO2 98 %. No results found.  Recent Labs  02/26/15 0600  WBC 7.6  HGB 10.7*  HCT 33.2*  PLT 369    Recent Labs  02/26/15 0600  NA 138  K 3.7  CL 101  GLUCOSE 92  BUN 14  CREATININE 1.66*  CALCIUM 9.0   CBG (last 3)  No results for input(s): GLUCAP in the last 72 hours.  Wt Readings from Last 3 Encounters:  02/27/15 115.9 kg (255 lb 8.2 oz)  02/20/15 114 kg (251 lb 5.2 oz)  02/07/15 115.667 kg (255 lb)    Physical Exam:  HENT:  Head: Normocephalic.  Eyes: EOM are normal.  Neck: Normal range of motion. Neck supple. No thyromegaly present.  Cardiovascular:  Cardiac rate controlled. Regular rhythm   Respiratory: Effort normal and breath sounds normal. No wheezes or rales GI: Soft. Bowel sounds are normal. He exhibits no distension.  Neurological: He is alert.  Oriented to person, place, month. Follows simple commands. 4/5 ue's and 4- to 4/5 prox to distal in LE's.  Skin:  All wounds clean and dry Psychiatric: He has a normal mood and affect. His behavior is normal Sensation intact to light touch in both upper and lower limbs Musculoskeletal no pain with range of motion in bilateral upper or lower limbs no joint swelling noted in the upper or lower limbs Psych: pleasant and cooperative. A little distracted this am  Assessment/Plan: 1. Functional deficits secondary to debility after AI, subsequent AVR which require 3+ hours per day of interdisciplinary therapy in a comprehensive inpatient rehab  setting. Physiatrist is providing close team supervision and 24 hour management of active medical problems listed below. Physiatrist and rehab team continue to assess barriers to discharge/monitor patient progress toward functional and medical goals. FIM: FIM - Bathing Bathing Steps Patient Completed: Chest, Right Arm, Left Arm, Abdomen, Front perineal area, Buttocks, Right upper leg, Right lower leg (including foot), Left upper leg, Left lower leg (including foot) Bathing: 0: Activity did not occur  FIM - Upper Body Dressing/Undressing Upper body dressing/undressing steps patient completed: Thread/unthread right sleeve of pullover shirt/dresss, Thread/unthread left sleeve of pullover shirt/dress, Put head through opening of pull over shirt/dress, Pull shirt over trunk Upper body dressing/undressing: 5: Supervision: Safety issues/verbal cues FIM - Lower Body Dressing/Undressing Lower body dressing/undressing steps patient completed: Thread/unthread right underwear leg, Thread/unthread left underwear leg, Pull underwear up/down, Thread/unthread right pants leg, Thread/unthread left pants leg, Pull pants up/down, Don/Doff right sock, Don/Doff left sock, Fasten/unfasten pants Lower body dressing/undressing: 4: Steadying Assist  FIM - Toileting Toileting steps completed by patient: Adjust clothing prior to toileting, Adjust clothing after toileting, Performs perineal hygiene Toileting Assistive Devices: Grab bar or rail for support Toileting: 5: Supervision: Safety issues/verbal cues  FIM - Diplomatic Services operational officer Devices: Art gallery manager Transfers: 5-To toilet/BSC: Supervision (verbal cues/safety issues), 5-From toilet/BSC: Supervision (verbal cues/safety issues)  FIM - Banker Devices: Therapist, occupational: 4: Supine > Sit: Min A (steadying Pt. > 75%/lift 1 leg), 5: Bed > Chair  or W/C: Supervision (verbal cues/safety issues), 5:  Chair or W/C > Bed: Supervision (verbal cues/safety issues)  FIM - Locomotion: Wheelchair Distance: 200 Locomotion: Wheelchair: 5: Travels 150 ft or more: maneuvers on rugs and over door sills with supervision, cueing or coaxing FIM - Locomotion: Ambulation Locomotion: Ambulation Assistive Devices: Designer, industrial/productWalker - Rolling Ambulation/Gait Assistance: 5: Supervision Locomotion: Ambulation: 5: Travels 150 ft or more with supervision/safety issues  Comprehension Comprehension Mode: Auditory Comprehension: 5-Understands basic 90% of the time/requires cueing < 10% of the time  Expression Expression Mode: Verbal Expression: 5-Expresses basic needs/ideas: With no assist  Social Interaction Social Interaction: 6-Interacts appropriately with others with medication or extra time (anti-anxiety, antidepressant).  Problem Solving Problem Solving: 5-Solves basic 90% of the time/requires cueing < 10% of the time  Memory Memory: 5-Recognizes or recalls 90% of the time/requires cueing < 10% of the time Medical Problem List and Plan: 1. Functional deficits secondary to debilitation related to aortic Insufficiency and subsequent AVR, CAD s/p CABGalso has encephalopathy 2. DVT Prophylaxis/Anticoagulation: Eliquis 3. Pain Management: Ultram as needed 4. Mood/depression/anxiety: Lamictal 100 mg twice a day, Lexapro 20 mg daily, Klonopin 0.5 mg daily at bedtime, BuSpar 15 mg twice a day, Wellbutrin 300 mg daily. 5. Neuropsych: This patient is not capable of making decisions on his own behalf.  -improving MS  -added low dose trazodone PRN for sleep 6. Skin/Wound Care: Routine skin checks 7. Fluids/Electrolytes/Nutrition: intake appears solid 8. Hypertension /Atrial fibrillation. Cardizem 60 mg every 12 hours, Norvasc 5 mg daily. Cardiac rate controlled. 9. Chronic renal insufficiency: Cr near baseline --1.66 today. Labs reviewed 10. History of traumatic brain injury with craniotomy 1960. Independent prior to  admission using a cane and/or walker 11. Recent fall with right proximal fibula fracture February 2016. No wb restrictions currently 12. Hyperlipidemia. Lipitor 13. Hypothyroidism. Synthroid 14. MRSA PCR screening positive. Contact precautions to continue 15. Acute blood loss anemia. hgb up to 10.7 10/29 16. Intermittent nausea with history of pancreatitis. Lipase level of 82. Recent ultrasound of abdomen with mild fatty infiltration of liver otherwise unremarkable. On soft diet without issues  -recent LFT's normal. 17.BPH/Uro: Continue Avodart.     -rx pseudomonas UTI with cipro 250mg  bid for 3 days total     LOS (Days) 7 A FACE TO FACE EVALUATION WAS PERFORMED  SWARTZ,ZACHARY T 02/27/2015 7:40 AM

## 2015-02-27 NOTE — Progress Notes (Signed)
Social Work Patient ID: [name] [name], male   DOB: 06-11-1932, 81 y.o.   MRN: [id number]   Nigel Sloop, LCSW Social Worker Signed  Progress Notes 02/26/2015  9:22 AM    Expand All Collapse All   Inpatient RehabilitationTeam Conference and Plan of Care Update Date: 02/25/2015   Time: 2:05 PM     Patient Name: [name] [name]       Medical Record Number: [id number]  Date of Birth: 11-Oct-1931 Sex: Male         Room/Bed: 4W16C/4W16C-01 Payor Info: Payor: MEDICARE / Plan: MEDICARE PART A AND B / Product Type: *No Product type* /    Admitting Diagnosis: Deconditioned avr no Slp   Admit Date/Time:  02/20/2015  6:27 PM Admission Comments: No comment available   Primary Diagnosis:  <principal problem not specified> Principal Problem: <principal problem not specified>    Patient Active Problem List     Diagnosis  Date Noted   .  Debilitated  02/20/2015   .  S/P AVR  02/11/2015   .  Ataxia  12/05/2014   .  Abnormality of gait  12/05/2014   .  Neck pain, chronic  12/05/2014   .  Brisk deep tendon reflexes  12/05/2014   .  Abnormal carotid duplex scan  10/28/2014   .  OSA (obstructive sleep apnea)  10/28/2014   .  Mood disorder with psychosis  10/28/2014   .  Episodes of formed visual hallucinations  10/16/2014   .  Faintness     .  Syncope  10/15/2014   .  Acute on chronic renal failure  10/15/2014   .  Right fibular fracture  10/15/2014   .  Hypoxia  10/15/2014   .  Syncope and collapse  10/15/2014   .  CAD (coronary artery disease), native coronary artery  09/29/2014   .  Falls frequently  09/29/2014   .  Hypertension     .  Hypercholesteremia     .  Aortic valve insufficiency     .  Depression     .  Atrial fibrillation, chronic  06/20/2014   .  Hypothyroidism  06/20/2014   .  DOE (dyspnea on exertion)  06/20/2014   .  Cough  04/29/2014   .  Shortness of breath  04/29/2014     Expected Discharge Date: Expected Discharge Date: 03/01/15  Team Members  Present: Physician leading conference: Dr. Faith Rogue Social Worker Present: Staci Acosta, LCSW Nurse Present: Carmie End, RN PT Present: Karolee Stamps, Varney Biles, PT OT Present: Donzetta Kohut, OT;Other (comment) Johnsie Cancel, OT) SLP Present: Feliberto Gottron, SLP PPS Coordinator present : Tora Duck, RN, CRRN        Current Status/Progress  Goal  Weekly Team Focus   Medical     debility related to AVS s/p AVR. improving confusion   improve safety/stamina  mentation, urinary tract treatment, cv mgt   Bowel/Bladder     continent of bowel and bladder LBM 6/29          Swallow/Nutrition/ Hydration               ADL's     Supervision for transfers, upper body dressing and transfers, min A lower body dressing  Supervision bathing/dressing, transfers, dynamic standing   Improved safety awareness, increased endurance, dynamic stanidng balance   Mobility     min A with intermittent mod A for sit > supine   supervision overall  activity tolerance/endurance, balance,  gait   Communication               Safety/Cognition/ Behavioral Observations              Pain     Ultram q 4 hours PRN  continue to assess pain prior to therapy sessions   pain <4   Skin     Mid sternal incision and bil LE incisions   Educate s/s of infection, and assess incision q shift   Continue to monitor healing of incisions     Rehab Goals Patient on target to meet rehab goals: Yes Rehab Goals Revised: none *See Care Plan and progress notes for long and short-term goals.    Barriers to Discharge:  safety awareness, stamina     Possible Resolutions to Barriers:   pacing, supervision goals     Discharge Planning/Teaching Needs:   Pt plans to go home to his home with his wife to provide supervision only.  Son will come by to check on parents and may be interested in hiring private duty caregivers for a few hours a day for them.   Pt's wife has worked with therapists for family education.  CSW invited son  to observe therapies as he is able.    Team Discussion:    Pt is debilitated after AVR and most likely was failure to thrive PTA with old brain injury from the [id number].  Pt's mental status is improving and confusion is clearing.  Pt is MRSA positive and being treated for UTI.  Pt is emptying bladder well and RN to increase senna to 2 twice a day to help with constipation.  PT is encouraging him to stay in the recliner in between therapies to try to increase stamina/endurance.  OT reports pt's awareness is poor and he thinks he can drive at d/c and plans to not use the walker.  Pt has DME needs and HH f/u.   Revisions to Treatment Plan:    none    Continued Need for Acute Rehabilitation Level of Care: The patient requires daily medical management by a physician with specialized training in physical medicine and rehabilitation for the following conditions: Daily direction of a multidisciplinary physical rehabilitation program to ensure safe treatment while eliciting the highest outcome that is of practical value to the patient.: Yes Daily medical management of patient stability for increased activity during participation in an intensive rehabilitation regime.: Yes Daily analysis of laboratory values and/or radiology reports with any subsequent need for medication adjustment of medical intervention for : Cardiac problems;Post surgical problems;Neurological problems;Other  Miguel Medal, Vista DeckJennifer Capps 02/27/2015, 11:01 AM

## 2015-02-28 ENCOUNTER — Inpatient Hospital Stay (HOSPITAL_COMMUNITY): Payer: Medicare Other | Admitting: Physical Therapy

## 2015-02-28 ENCOUNTER — Inpatient Hospital Stay (HOSPITAL_COMMUNITY): Payer: Self-pay

## 2015-02-28 MED ORDER — AMLODIPINE BESYLATE 10 MG PO TABS
10.0000 mg | ORAL_TABLET | Freq: Every day | ORAL | Status: DC
  Administered 2015-03-01: 10 mg via ORAL
  Filled 2015-02-28 (×2): qty 1

## 2015-02-28 MED ORDER — FLUTICASONE PROPIONATE 50 MCG/ACT NA SUSP: 1.0000 | Freq: Every day | NASAL | Status: DC

## 2015-02-28 MED ORDER — LAMOTRIGINE 100 MG PO TABS: 100.0000 mg | ORAL_TABLET | Freq: Two times a day (BID) | ORAL | Status: DC

## 2015-02-28 MED ORDER — FOLIC ACID 1 MG PO TABS: 1.0000 mg | ORAL_TABLET | Freq: Every day | ORAL | Status: DC

## 2015-02-28 MED ORDER — BUSPIRONE HCL 15 MG PO TABS: 15.0000 mg | ORAL_TABLET | Freq: Two times a day (BID) | ORAL | Status: DC

## 2015-02-28 MED ORDER — ATORVASTATIN CALCIUM 10 MG PO TABS: 10.0000 mg | ORAL_TABLET | Freq: Every day | ORAL | Status: DC

## 2015-02-28 MED ORDER — TRAMADOL HCL 50 MG PO TABS
50.0000 mg | ORAL_TABLET | ORAL | Status: DC | PRN
Start: 2015-02-28 — End: 2015-11-03

## 2015-02-28 MED ORDER — CLONAZEPAM 0.5 MG PO TABS: 0.5000 mg | ORAL_TABLET | Freq: Every day | ORAL | Status: DC

## 2015-02-28 MED ORDER — PANTOPRAZOLE SODIUM 40 MG PO TBEC: 40.0000 mg | DELAYED_RELEASE_TABLET | Freq: Every day | ORAL | Status: DC

## 2015-02-28 MED ORDER — ESCITALOPRAM OXALATE 20 MG PO TABS: 20.0000 mg | ORAL_TABLET | Freq: Every day | ORAL | Status: DC

## 2015-02-28 MED ORDER — APIXABAN 2.5 MG PO TABS: 2.5000 mg | ORAL_TABLET | Freq: Two times a day (BID) | ORAL | Status: DC

## 2015-02-28 MED ORDER — BUPROPION HCL ER (XL) 300 MG PO TB24: 300.0000 mg | ORAL_TABLET | Freq: Every day | ORAL | Status: DC

## 2015-02-28 MED ORDER — AMLODIPINE BESYLATE 5 MG PO TABS: 5.0000 mg | ORAL_TABLET | Freq: Every day | ORAL | Status: DC

## 2015-02-28 MED ORDER — LEVOTHYROXINE SODIUM 150 MCG PO TABS: 150.0000 ug | ORAL_TABLET | Freq: Every day | ORAL | Status: DC

## 2015-02-28 MED ORDER — DILTIAZEM HCL ER 60 MG PO CP12: 60.0000 mg | ORAL_CAPSULE | Freq: Two times a day (BID) | ORAL | Status: DC

## 2015-02-28 MED ORDER — DUTASTERIDE 0.5 MG PO CAPS: 0.5000 mg | ORAL_CAPSULE | Freq: Every day | ORAL | Status: DC

## 2015-02-28 NOTE — Progress Notes (Signed)
Physical Therapy Discharge Summary  Patient Details  Name: [name] [name] MRN: [id number] Date of Birth: Apr 26, 1932  Today's Date: 02/28/2015 PT Individual Time: 1100-1200 Treatment Session 1: 1100-1200 Treatment Session 2: 1430-1600 Treatment Session 1: 60 min Treatment Session 2: 90 min PT Individual Time Calculation (min): 60 min    Patient has met 11 of 11 long term goals due to improved activity tolerance, improved balance, improved postural control, increased strength, decreased pain, ability to compensate for deficits, improved awareness and improved coordination.  Patient to discharge at an ambulatory level Supervision.   Patient's care partner is independent to provide the necessary cognitive assistance at discharge.  Recommendation:  Patient will benefit from ongoing skilled PT services in home health and/or cardiac rehab to continue to advance safe functional mobility, address ongoing impairments in balance, endurance, safety, and minimize fall risk.  Equipment: wide RW  Reasons for discharge: treatment goals met and discharge from hospital  Patient/family agrees with progress made and goals achieved: Yes  PT Intervention: Treatment Session 1: Gait Training: Son demonstrates ability to safely guard pt during ambulation with RW req SBA 250' x 2 reps. SaO2 drops to 92% on RA, but pt recovers with seated rest break.  PT instructs son in assisting pt over 2" threshold with RW req CGA-SBA, as well as assist for RW, and son demonstrates understanding with pt.   Therapeutic Activity: PT simulates Tran-Sit car at high height with step for simulated runner and instructs pt in transfer with RW, explaining sequencing for safety and pt completes with PT req SBA and with son demonstrating understanding on a 2nd rep. SaO2 drops to 83% on RA - pt reports he may have been holding his breath - SaO2 recovers with seated rest break.  PT has wife demonstrate attempts to use bedsheet or hands on  assist at pt's shoulders during side lie to sit transfer from flat bed with only one pillow, and wife unable to assist pt without pt pushing with R arm, breaking sternal precautions. PT sets up "pillow wedge" on bed and pt demonstrates ability to achieve R side lie to sit transfer with supervision - family and pt agree this will be the best bed mobility technique for at home.   Therapeutic Exercise: PT instructs pt in using incentive spirometry and explains to family members how to ensure pt is completing it appropriately and pt demonstrates 2175 mL on best attempt of inspiration, x 10 reps.   Treatment Session 2: Therapeutic Activity: Pt received in R side lie and PT instructs pt in R side lie to sit transfer req SBA from PT, and in stand-step transfer req SBA and use of momentum by pt.   Gait Training: PT instructs pt in ascending/descneding corner stairs with B handrails req CGA for safety and verbal cues to get entire foot on the step: step over step pattern until descending the 6" height stairs, which is when pt transitions to step-to pattern.   Community Integration: PT instructs pt in w/c propulsion with B LEs in community setting and pt demonstrates ability to do so mod I x 200'. PT instructs pt in ambulation with RW x 150' x 2 reps req SBA for safety on uneven terrain.   Therapeutic Exercise: PT instructs pt in standing HEP: hip abduction, knee flexion, mini-squats, heel/toe raises, and seated LAQ 2 x 10 reps each and pt demonstrates understanding.   Pt is safe to d/c home with family assist.     PT Discharge Precautions/Restrictions Precautions  Precautions: Fall;Sternal Restrictions Weight Bearing Restrictions: No Vital Signs: See flow sheet  Pain Pain Assessment Pain Assessment: 0-10 Pain Score: 5  Pain Type: Acute pain;Surgical pain Pain Location: Chest Pain Orientation: Mid;Lower Pain Descriptors / Indicators: Aching;Penetrating Pain Onset: On-going Pain  Intervention(s): Rest;Emotional support Multiple Pain Sites: No  Treatment Session 2: Pt c/o 5/10 pain in lower sternum - RN notified and pain meds requested.   Vision/Perception  Perception Comments: appears wfl Retail buyer Comments: appears wfl  Cognition Overall Cognitive Status: History of cognitive impairments - at baseline Arousal/Alertness: Awake/alert Orientation Level: Oriented X4 Attention: Focused;Sustained Focused Attention: Appears intact Sustained Attention: Appears intact Selective Attention: Appears intact Memory: Impaired Memory Impairment: Decreased recall of new information Awareness: Impaired Awareness Impairment: Emergent impairment Problem Solving: Impaired Problem Solving Impairment: Functional complex;Functional basic Sensation Sensation Light Touch: Appears Intact Stereognosis: Not tested Hot/Cold: Not tested Proprioception: Appears Intact Coordination Gross Motor Movements are Fluid and Coordinated: Yes Fine Motor Movements are Fluid and Coordinated: Not tested Motor  Motor Motor: Abnormal postural alignment and control;Abnormal tone Motor - Discharge Observations: generalized slouched sitting posture with forward head, B UE tremors (baseline)  Mobility Bed Mobility Bed Mobility: Sit to Supine;Right Sidelying to Sit Right Sidelying to Sit: 5: Supervision (with pillow wedge) Right Sidelying to Sit Details: Verbal cues for technique Sit to Supine: 6: Modified independent (Device/Increase time) (with use of momentum) Transfers Transfers: Yes Sit to Stand: 5: Supervision Sit to Stand Details: Verbal cues for precautions/safety Stand to Sit: 5: Supervision Stand to Sit Details (indicate cue type and reason): Verbal cues for precautions/safety Stand Pivot Transfers: 5: Supervision Stand Pivot Transfer Details: Verbal cues for precautions/safety Locomotion  Ambulation Ambulation: Yes Ambulation/Gait Assistance: 5: Supervision Ambulation  Distance (Feet): 250 Feet Assistive device: Rolling walker Ambulation/Gait Assistance Details: Verbal cues for precautions/safety Ambulation/Gait Assistance Details: occasional narrow BOS with light UE support on RW Gait Gait: Yes Gait Pattern: Impaired Gait Pattern: Narrow base of support;Trunk flexed;Step-through pattern Gait velocity: typical Wheelchair Mobility Wheelchair Mobility: No (due to ambulation ability)  Trunk/Postural Assessment  Cervical Assessment Cervical Assessment: Within Functional Limits Thoracic Assessment Thoracic Assessment: Within Functional Limits Lumbar Assessment Lumbar Assessment: Exceptions to [facility] Lumbar AROM Overall Lumbar AROM: Deficits;Due to premorid status Overall Lumbar AROM Comments: generalized stiffness Postural Control Postural Control: Deficits on evaluation Postural Limitations: general slouched sitting posture with forward head  Balance Balance Balance Assessed: Yes Static Sitting Balance Static Sitting - Balance Support: Feet supported;No upper extremity supported Static Sitting - Level of Assistance: 7: Independent Dynamic Sitting Balance Dynamic Sitting - Balance Support: Feet unsupported;No upper extremity supported Dynamic Sitting - Level of Assistance: 6: Modified independent (Device/Increase time) Static Standing Balance Static Standing - Balance Support: During functional activity;No upper extremity supported Static Standing - Level of Assistance: 5: Stand by assistance Dynamic Standing Balance Dynamic Standing - Balance Support: During functional activity;No upper extremity supported Dynamic Standing - Level of Assistance: 5: Stand by assistance Extremity Assessment  RUE Assessment RUE Assessment: Exceptions to [facility] RUE AROM (degrees) Overall AROM Right Upper Extremity: Deficits;Due to precautions RUE Overall AROM Comments: arm flexion limited to 90 degrees (sternal prec); elbow and grip wfl RUE Strength RUE Overall  Strength Comments: shoulder & elbow NT due to precautions; grip 5/5 LUE Assessment LUE Assessment: Exceptions to WFL LUE AROM (degrees) Overall AROM Left Upper Extremity: Deficits;Due to precautions LUE Overall AROM Comments: arm flexion limited to 90 degrees (sternal prec); elbow and grip wfl LUE Strength LUE Overall Strength Comments: shoulder & elbow  NT due to sternal precautions; grip 5/5 RLE Assessment RLE Assessment: Within Functional Limits LLE Assessment LLE Assessment: Within Functional Limits  See FIM for current functional status  Revella Shelton M 02/28/2015, 12:27 PM

## 2015-02-28 NOTE — Plan of Care (Signed)
Problem: RH PAIN MANAGEMENT Goal: RH STG PAIN MANAGED AT OR BELOW PT'S PAIN GOAL Pain less than/equal to 4  Outcome: Progressing Pain managed well with prn tramadol

## 2015-02-28 NOTE — Progress Notes (Signed)
PHYSICAL MEDICINE & REHABILITATION     PROGRESS NOTE    Subjective/Complaints: Slept well two nights in a row. Occasional chest pain with deep breaths. Pleased with progress.  ROS: Pt denies  headache,nausea, vomiting, abdominal pain, diarrhea, , shortness of breath, palpitations, dysuria,    Objective: Vital Signs: Blood pressure 133/91, pulse 86, temperature 97.8 F (36.6 C), temperature source Oral, resp. rate 16, height 6' (1.829 m), weight 115.803 kg (255 lb 4.8 oz), SpO2 100 %. No results found.  Recent Labs  02/26/15 0600  WBC 7.6  HGB 10.7*  HCT 33.2*  PLT 369    Recent Labs  02/26/15 0600  NA 138  K 3.7  CL 101  GLUCOSE 92  BUN 14  CREATININE 1.66*  CALCIUM 9.0   CBG (last 3)  No results for input(s): GLUCAP in the last 72 hours.  Wt Readings from Last 3 Encounters:  02/27/15 115.803 kg (255 lb 4.8 oz)  02/20/15 114 kg (251 lb 5.2 oz)  02/07/15 115.667 kg (255 lb)    Physical Exam:  HENT:  Head: Normocephalic.  Eyes: EOM are normal.  Neck: Normal range of motion. Neck supple. No thyromegaly present.  Cardiovascular:  Cardiac rate controlled. Regular rhythm   Respiratory: Effort normal and breath sounds normal. No wheezes or rales GI: Soft. Bowel sounds are normal. He exhibits no distension.  Neurological: He is alert.  Oriented to person, place, date. Awareness and insight much improved. 4+/5 ue's and  4/5 prox to distal in LE's.  Skin:  All wounds clean and dry Psychiatric: He has a normal mood and affect. His behavior is normal Sensation intact to light touch in both upper and lower limbs Musculoskeletal no pain with range of motion in bilateral upper or lower limbs no joint swelling noted in the upper or lower limbs Psych: pleasant and cooperative.   Assessment/Plan: 1. Functional deficits secondary to debility after AI, subsequent AVR which require 3+ hours per day of interdisciplinary therapy in a comprehensive inpatient  rehab setting. Physiatrist is providing close team supervision and 24 hour management of active medical problems listed below. Physiatrist and rehab team continue to assess barriers to discharge/monitor patient progress toward functional and medical goals. FIM: FIM - Bathing Bathing Steps Patient Completed: Chest, Right Arm, Left Arm, Abdomen, Front perineal area, Buttocks, Right upper leg, Right lower leg (including foot), Left upper leg, Left lower leg (including foot) Bathing: 6: More than reasonable amount of time  FIM - Upper Body Dressing/Undressing Upper body dressing/undressing steps patient completed: Thread/unthread right sleeve of pullover shirt/dresss, Thread/unthread left sleeve of pullover shirt/dress, Put head through opening of pull over shirt/dress, Pull shirt over trunk Upper body dressing/undressing: 5: Set-up assist to: Obtain clothing/put away FIM - Lower Body Dressing/Undressing Lower body dressing/undressing steps patient completed: Thread/unthread right underwear leg, Pull underwear up/down, Thread/unthread left underwear leg, Thread/unthread right pants leg, Thread/unthread left pants leg, Pull pants up/down, Fasten/unfasten pants, Don/Doff right shoe, Don/Doff left shoe Lower body dressing/undressing: 5: Set-up assist to: Don/Doff TED stocking  FIM - Toileting Toileting steps completed by patient: Adjust clothing prior to toileting, Performs perineal hygiene, Adjust clothing after toileting Toileting Assistive Devices: Grab bar or rail for support Toileting: 5: Supervision: Safety issues/verbal cues  FIM - Diplomatic Services operational officerToilet Transfers Toilet Transfers Assistive Devices: Art gallery managerWalker Toilet Transfers: 5-To toilet/BSC: Supervision (verbal cues/safety issues), 5-From toilet/BSC: Supervision (verbal cues/safety issues)  FIM - BankerBed/Chair Transfer Bed/Chair Transfer Assistive Devices: Walker, Arm rests (multiple pillows making a wedge on bed  to assist with supine to sit) Bed/Chair  Transfer: 5: Bed > Chair or W/C: Supervision (verbal cues/safety issues), 5: Chair or W/C > Bed: Supervision (verbal cues/safety issues), 5: Supine > Sit: Supervision (verbal cues/safety issues), 5: Sit > Supine: Supervision (verbal cues/safety issues)  FIM - Locomotion: Wheelchair Distance: 155 Locomotion: Wheelchair: 5: Travels 150 ft or more: maneuvers on rugs and over door sills with supervision, cueing or coaxing FIM - Locomotion: Ambulation Locomotion: Ambulation Assistive Devices: Designer, industrial/product Ambulation/Gait Assistance: 5: Supervision Locomotion: Ambulation: 5: Travels 150 ft or more with supervision/safety issues  Comprehension Comprehension Mode: Auditory Comprehension: 5-Understands basic 90% of the time/requires cueing < 10% of the time  Expression Expression Mode: Verbal Expression: 5-Expresses basic needs/ideas: With no assist  Social Interaction Social Interaction: 6-Interacts appropriately with others with medication or extra time (anti-anxiety, antidepressant).  Problem Solving Problem Solving: 5-Solves basic 90% of the time/requires cueing < 10% of the time  Memory Memory: 5-Recognizes or recalls 90% of the time/requires cueing < 10% of the time Medical Problem List and Plan: 1. Functional deficits secondary to debilitation related to aortic Insufficiency and subsequent AVR, CAD s/p CABGalso has encephalopathy 2. DVT Prophylaxis/Anticoagulation: Eliquis---continue 3. Pain Management: Ultram as needed 4. Mood/depression/anxiety: Lamictal 100 mg twice a day, Lexapro 20 mg daily, Klonopin 0.5 mg daily at bedtime, BuSpar 15 mg twice a day, Wellbutrin 300 mg daily---no changes 5. Neuropsych: This patient is capable of making decisions on his own behalf.  -mental status back to baseline  -added low dose trazodone PRN for sleep 6. Skin/Wound Care: Routine skin checks 7. Fluids/Electrolytes/Nutrition: intake appears solid 8. Hypertension /Atrial fibrillation.  Cardizem 60 mg every 12 hours, Norvasc 5 mg daily. Cardiac rate controlled.  -will increase norvasc to  daily to better control DBP 9. Chronic renal insufficiency: Cr near baseline --follow up with pcp.   10. History of traumatic brain injury with craniotomy 1960. Independent prior to admission using a cane and/or walker 11. Recent fall with right proximal fibula fracture February 2016. No wb restrictions currently 12. Hyperlipidemia. Lipitor 13. Hypothyroidism. Synthroid 14. MRSA PCR screening positive. Contact precautions to continue 15. Acute blood loss anemia. hgb up to 10.7 10/29 16. Intermittent nausea with history of pancreatitis. Lipase level of 82. Recent ultrasound of abdomen with mild fatty infiltration of liver otherwise unremarkable. On soft diet without issues  -recent LFT's normal. 17.BPH/Uro: Continue Avodart.     -rx pseudomonas UTI s/p cipro     LOS (Days) 8 A FACE TO FACE EVALUATION WAS PERFORMED  Cinda Hara T 02/28/2015 8:29 AM

## 2015-02-28 NOTE — Discharge Summary (Signed)
Discharge summary job [phone]

## 2015-02-28 NOTE — Progress Notes (Signed)
Occupational Therapy Discharge Summary  Patient Details  Name: [name] [name] MRN: [id number] Date of Birth: 11/20/1931  Patient has met 12 of 12 long term goals due to improved activity tolerance, ability to compensate for deficits, improved attention and improved awareness.  Patient to discharge at overall Supervision level.  Patient's care partner is independent to provide the necessary cognitive assistance at discharge.    Pt made good progress during admission however verbalizes plan of non-adherence to recommendations relating to use of gait aid by therapists (PT and OT) and to precaution to not drive.   Family present during training report plan to insure compliance with recommendations.   Recommendation:  Patient will benefit from ongoing skilled OT services in home health setting to continue to advance functional skills in the area of iADL.  Equipment: BSC, shower seat  Reasons for discharge: treatment goals met  Patient/family agrees with progress made and goals achieved: Yes  OT Discharge ADL ADL ADL Comments: see FIM  Vision/Perception  Vision- History Baseline Vision/History: Wears glasses Wears Glasses: Reading only Patient Visual Report: No change from baseline Vision- Assessment Vision Assessment?: No apparent visual deficits   Cognition Overall Cognitive Status: History of cognitive impairments - at baseline Arousal/Alertness: Awake/alert Orientation Level: Oriented X4 Attention: Focused;Sustained Focused Attention: Appears intact Sustained Attention: Appears intact Selective Attention: Appears intact Memory: Impaired Memory Impairment: Decreased recall of new information Awareness: Impaired Awareness Impairment: Emergent impairment Problem Solving: Impaired Problem Solving Impairment: Functional complex;Functional basic Executive Function: Glass blower/designer: Appears Intact Stereognosis: Not tested Hot/Cold: Appears  Intact Proprioception: Appears Intact Coordination Gross Motor Movements are Fluid and Coordinated: Yes Fine Motor Movements are Fluid and Coordinated: No Coordination and Movement Description: Mild-moderate tremors, bilateral hands impair dexterity  Motor  Motor Motor: Abnormal postural alignment and control;Abnormal tone Motor - Discharge Observations: generalized slouched sitting posture with forward head, B UE tremors (baseline)  Mobility  Bed Mobility Bed Mobility: Sit to Supine;Right Sidelying to Sit Right Sidelying to Sit: 5: Supervision (with pillow wedge) Right Sidelying to Sit Details: Verbal cues for technique Sit to Supine: 6: Modified independent (Device/Increase time) (with use of momentum) Transfers Sit to Stand: 5: Supervision Sit to Stand Details: Verbal cues for precautions/safety Stand to Sit: 5: Supervision Stand to Sit Details (indicate cue type and reason): Verbal cues for precautions/safety   Trunk/Postural Assessment  Cervical Assessment Cervical Assessment: Within Functional Limits Thoracic Assessment Thoracic Assessment: Within Functional Limits Lumbar Assessment Lumbar Assessment: Exceptions to [facility] Lumbar AROM Overall Lumbar AROM: Deficits;Due to premorid status Overall Lumbar AROM Comments: generalized stiffness Postural Control Postural Control: Deficits on evaluation Postural Limitations: general slouched sitting posture with forward head   Balance Balance Balance Assessed: Yes Static Sitting Balance Static Sitting - Balance Support: Feet supported;No upper extremity supported Static Sitting - Level of Assistance: 7: Independent Dynamic Sitting Balance Dynamic Sitting - Balance Support: Feet unsupported;No upper extremity supported Dynamic Sitting - Level of Assistance: 6: Modified independent (Device/Increase time) Static Standing Balance Static Standing - Balance Support: During functional activity;No upper extremity supported Static  Standing - Level of Assistance: 5: Stand by assistance Dynamic Standing Balance Dynamic Standing - Balance Support: During functional activity;No upper extremity supported Dynamic Standing - Level of Assistance: 5: Stand by assistance  Extremity/Trunk Assessment RUE Assessment RUE Assessment: Exceptions to [facility] RUE AROM (degrees) Overall AROM Right Upper Extremity: Deficits;Due to precautions RUE Overall AROM Comments: arm flexion limited to 90 degrees (sternal prec); elbow and grip wfl RUE Strength RUE  Overall Strength Comments: shoulder & elbow NT due to precautions; grip 5/5 LUE Assessment LUE Assessment: Exceptions to WFL LUE AROM (degrees) Overall AROM Left Upper Extremity: Deficits;Due to precautions LUE Overall AROM Comments: arm flexion limited to 90 degrees (sternal prec); elbow and grip wfl LUE Strength LUE Overall Strength Comments: shoulder & elbow NT due to sternal precautions; grip 5/5  See FIM for current functional status  Leroy Libman 02/28/2015, 3:59 PM

## 2015-02-28 NOTE — Progress Notes (Signed)
Occupational Therapy Session Note  Patient Details  Name: [name] [name] MRN: [id number] Date of Birth: 08/22/1932  Today's Date: 02/28/2015 OT Individual Time: 1000-1100 OT Individual Time Calculation (min): 60 min    Short Term Goals: Week 1:  OT Short Term Goal 1 (Week 1): STG=LTG due to short anticipated LOS  Skilled Therapeutic Interventions/Progress Updates:    Pt engaged in BADL retraining with focus on activity tolerance, sit<>stand, standing balance, functional amb for home mgmt tasks, problem solving, and safety awareness.  Pt requires more than reasonable amount of time to complete tasks with multiple rest breaks.  Pt amb with RW from room to ADL apartment to practice simulated shower transfers stepping over 4" ledge.  Reeducated patient on MD orders for no driving until follow up appointment.  Pt verbalized understanding.  Pt pleased with progress and states he is ready for for discharge.   Therapy Documentation Precautions:  Precautions Precautions: Fall, Sternal Restrictions Weight Bearing Restrictions: Yes (sternal precautions) RLE Weight Bearing: Weight bearing as tolerated  Pain:  Pt denied pain ADL: ADL ADL Comments: see FIM  See FIM for current functional status  Therapy/Group: Individual Therapy  Rich BraveLanier, Darlyne Schmiesing Chappell 02/28/2015, 11:01 AM

## 2015-02-28 NOTE — Discharge Summary (Signed)
[name] [name]:  [name] [name], [name] [name]              ACCOUNT NO.:  [id number]  MEDICAL RECORD NO.:  [id number]  LOCATION:  [id number]                        FACILITY:  [facility]  PHYSICIAN:  Ranelle OysterZachary T. Swartz, M.D.DATE OF BIRTH:  [birth year]  DATE OF ADMISSION:  02/20/2015 DATE OF DISCHARGE:  03/01/2015                              DISCHARGE SUMMARY   DISCHARGE DIAGNOSES: 1. Functional deficits secondary to debilitation related to aortic     sufficiency and subsequent aortic valve replacement, coronary     artery disease status post coronary artery bypass graft as well as     encephalopathy. 2. Eliquis for deep vein thrombosis prophylaxis. 3. Depression with anxiety. 4. Hypertension. 5. Chronic renal insufficiency. 6. History of traumatic brain injury with craniotomy in 1960. 7. Recent fall with right proximal fibular fracture. 8. Hyperlipidemia. 9. Hypothyroidism. 10.Methicillin-resistant Staphylococcus aureus PCR screening positive. 11.Acute blood loss anemia. 12.Pseudomonas urinary tract infection, resolved.  HISTORY OF PRESENT ILLNESS:  This is an 81 year old right-handed male with history of atrial fibrillation, maintained on Eliquis, chronic renal insufficiency, baseline creatinine 1.69, traumatic brain injury with craniotomy in 1960.  Presented on February 11, 2015, with history of severe aortic insufficiency, [facility]  The patient lives with his wife, used a walker and cane prior to admission with history of frequent falls.  Recently, he was placed in a nursing home in February after a proximal fibula fracture.  He was evaluated by Cardiothoracic Surgery for aortic valve insufficiency, underwent aortic valve replacement on February 17, 2015, per Dr. Tyrone SageGerhardt.  Hospital course, pain management.  Eliquis resumed postoperatively.  Contact precautions for MRSA PCR screening positive.  Bouts of nausea, history of pancreatitis, lipase level 82, diet slowly advanced.  Ultrasound  of gallbladder showed mild fatty infiltration of liver, otherwise unremarkable.  Physical and occupational therapy ongoing.  The patient was admitted for comprehensive rehab program.  PAST MEDICAL HISTORY:  See discharge diagnoses.  SOCIAL HISTORY:  Lives with spouse, independent prior to admission with occasional falls.  Functional status upon admission to rehab services: Minimal assist ambulate 100 feet rolling walker, minimal assist sit to stand, min-to-mod assist activities of daily living.  PHYSICAL EXAMINATION:  VITAL SIGNS:  Blood pressure 126/80, pulse 84, temperature 98, respirations 16. GENERAL:  This was an alert male, oriented to person, place, age. LUNGS:  Clear to auscultation. CARDIAC:  Rate controlled. ABDOMEN:  Soft, nontender.  Good bowel sounds.  REHABILITATION HOSPITAL COURSE:  The patient was admitted to inpatient rehab services with therapies initiated on a 3-hour daily basis consisting of physical therapy, occupational therapy, and rehabilitation nursing.  The following issues were addressed during the patient's rehabilitation stay.  Pertaining to [name] [name]'s functional deficits secondary to debilitation related to aortic insufficiency, he had undergone AVR, followed by Cardiothoracic Surgery.  He remained on Eliquis.  Ultram as needed for pain.  History of depression, anxiety. He continued on Lamictal, Lexapro, Klonopin, BuSpar, as well as Wellbutrin.  He was tolerating therapies, participating, emotional support provided.  Blood pressures remained well controlled.  No chest pain or shortness of breath.  Chronic renal insufficiency, baseline creatinine 1.66 and stable.  He would follow up with his primary MD.  He  did have a history of a recent right proximal fibula fracture after fall in February.  No weightbearing restrictions.  Contact precautions for MRSA PCR screening positive, remained afebrile.  Acute blood loss anemia stable, latest hemoglobin 10.7.   He completed a 3-day course of Cipro for Pseudomonas urinary tract infection, denying any dysuria or hematuria.  The patient received weekly collaborative interdisciplinary team conferences to discuss estimated length of stay, family teaching, and any barriers to discharge.  Ambulating rolling walker standby assist for safety, occasional loss of balance, strength and endurance continued to improve throughout his course.  Requires some verbal cues for supine to roll into the right side.  He manages his clothing for general hygiene.  Transfers on and off toilet with supervision.  He can don issues with set up and assistance.  Complete a stand pivot transfers, standby assistance.  Full family teaching was completed and plan discharge home.  DISCHARGE MEDICATIONS: 1. Norvasc 5 mg p.o. daily. 2. Eliquis 2.5 mg p.o. b.i.d. 3. Aspirin 81 mg p.o. daily. 4. Lipitor 10 mg p.o. daily. 5. Wellbutrin 300 mg p.o. daily. 6. BuSpar 50 mg p.o. b.i.d. 7. Klonopin 0.5 mg p.o. q.h.s. 8. Cardizem 60 mg p.o. every 12 hours. 9. Avodart 0.5 mg p.o. daily. 10.Lexapro 20 mg p.o. daily. 11.Flonase 1 spray daily. 12.Lamictal 100 mg p.o. b.i.d. 13.Synthroid 150 mcg p.o. daily. 14.Protonix 40 mg p.o. daily. 15.Senokot-S 2 tablets b.i.d., hold for loose stools. 16.Ultram 50 mg p.o. every 4 hours as needed for moderate pain. 17. Melatonin 3 mg daily at bedtime  DIET:  Soft.  FOLLOWUP:  He would follow up with Dr. Faith Rogue as needed; Dr. Sheliah Plane, call for appointment, 2 weeks; Dr. Kristeen Miss, call for appointment; Dr. Cain Saupe, March 17, 2015.  Ongoing therapies had been arranged as per [employer].     Mariam Dollar, P.A.   ______________________________ Ranelle Oyster, M.D.    DA/MEDQ  D:  02/28/2015  T:  02/28/2015  Job:  [id number]  cc:   Sheliah Plane, MD Cain Saupe, MD Vesta Mixer, M.D. Ranelle Oyster, M.D.

## 2015-03-01 NOTE — Progress Notes (Signed)
03/01/15 1115 nsg Patient discharged to home per wheelchair accompanied by NT and son. Discharge instructions given yesterday no questions. Updated medication list.

## 2015-03-01 NOTE — Progress Notes (Signed)
Patient ID: [name] StacksChrysler R Jesson, male   DOB: December 01, 1931, 81 y.o.   MRN: [id number]  [place] [facility]     PROGRESS NOTE   03/01/15. Subjective/Complaints: Remains stable.  Excited and ready for discharge ROS: Pt denies  headache,nausea, vomiting, abdominal pain, diarrhea, , shortness of breath, palpitations, dysuria,    Objective: Vital Signs: Blood pressure 115/83, pulse 93, temperature 98.4 F (36.9 C), temperature source Oral, resp. rate 18, height 6' (1.829 m), weight 255 lb 4.8 oz (115.803 kg), SpO2 94 %. No results found. No results for input(s): WBC, HGB, HCT, PLT in the last 72 hours. No results for input(s): NA, K, CL, GLUCOSE, BUN, CREATININE, CALCIUM in the last 72 hours.  Invalid input(s): CO CBG (last 3)  No results for input(s): GLUCAP in the last 72 hours.  Wt Readings from Last 3 Encounters:  02/27/15 255 lb 4.8 oz (115.803 kg)  02/20/15 251 lb 5.2 oz (114 kg)  02/07/15 255 lb (115.667 kg)    Physical Exam:  HENT:  Head: Normocephalic.  Eyes: EOM are normal.  Neck: Normal range of motion. Neck supple. No thyromegaly present.  Cardiovascular:  Cardiac rate controlled. Regular rhythm   Respiratory: Effort normal and breath sounds normal. No wheezes or rales GI: Soft. Bowel sounds are normal. He exhibits no distension.  Neurological: He is alert.  Oriented to person, place, date. Awareness and insight much improved. 4+/5 ue's and  4/5 prox to distal in LE's.  Skin:  All wounds clean and dry Psychiatric: He has a normal mood and affect. His behavior is normal Sensation intact to light touch in both upper and lower limbs Musculoskeletal no pain with range of motion in bilateral upper or lower limbs no joint swelling noted in the upper or lower limbs Psych: pleasant and cooperative.   Medical Problem List and Plan: 1. Functional deficits secondary to debilitation related to aortic Insufficiency and subsequent AVR, CAD s/p CABGalso  has encephalopathy 2. DVT Prophylaxis/Anticoagulation: Eliquis---continue 3. Pain Management: Ultram as needed 4. Mood/depression/anxiety: Lamictal 100 mg twice a day, Lexapro 20 mg daily, Klonopin 0.5 mg daily at bedtime, BuSpar 15 mg twice a day, Wellbutrin 300 mg daily---no changes 5. Neuropsych: This patient is capable of making decisions on his own behalf.  -mental status back to baseline  -added low dose trazodone PRN for sleep 6. Skin/Wound Care: Routine skin checks 7. Fluids/Electrolytes/Nutrition: intake appears solid 8. Hypertension /Atrial fibrillation. Cardizem 60 mg every 12 hours, Norvasc 5 mg daily. Cardiac rate controlled.  -will increase norvasc to 10mg  daily to better control DBP 9. Chronic renal insufficiency: Cr near baseline --follow up with pcp.   10. History of traumatic brain injury with craniotomy 1960. Independent prior to admission using a cane and/or walker 11. Recent fall with right proximal fibula fracture February 2016. No wb restrictions currently 12. Hyperlipidemia. Lipitor 13. Hypothyroidism. Synthroid 14. MRSA PCR screening positive. Contact precautions to continue 15. Acute blood loss anemia. hgb up to 10.7 10/29 16. Intermittent nausea with history of pancreatitis. Lipase level of 82. Recent ultrasound of abdomen with mild fatty infiltration of liver otherwise unremarkable. On soft diet without issues  -recent LFT's normal. 17.BPH/Uro: Continue Avodart.     -rx pseudomonas UTI s/p cipro 18.  Stable for d/c     LOS (Days) 9 A FACE TO FACE EVALUATION WAS PERFORMED  Rogelia BogaKWIATKOWSKI,Candela Krul FRANK 03/01/2015 7:50 AM

## 2015-03-02 ENCOUNTER — Telehealth: Payer: Self-pay | Admitting: Physician Assistant

## 2015-03-02 DIAGNOSIS — N189 Chronic kidney disease, unspecified: Secondary | ICD-10-CM | POA: Diagnosis not present

## 2015-03-02 DIAGNOSIS — I2510 Atherosclerotic heart disease of native coronary artery without angina pectoris: Secondary | ICD-10-CM | POA: Diagnosis not present

## 2015-03-02 DIAGNOSIS — I129 Hypertensive chronic kidney disease with stage 1 through stage 4 chronic kidney disease, or unspecified chronic kidney disease: Secondary | ICD-10-CM | POA: Diagnosis not present

## 2015-03-02 DIAGNOSIS — Z952 Presence of prosthetic heart valve: Secondary | ICD-10-CM | POA: Diagnosis not present

## 2015-03-02 DIAGNOSIS — Z87891 Personal history of nicotine dependence: Secondary | ICD-10-CM | POA: Diagnosis not present

## 2015-03-02 DIAGNOSIS — Z9181 History of falling: Secondary | ICD-10-CM | POA: Diagnosis not present

## 2015-03-02 DIAGNOSIS — F329 Major depressive disorder, single episode, unspecified: Secondary | ICD-10-CM | POA: Diagnosis not present

## 2015-03-02 DIAGNOSIS — Z87440 Personal history of urinary (tract) infections: Secondary | ICD-10-CM | POA: Diagnosis not present

## 2015-03-02 DIAGNOSIS — Z48812 Encounter for surgical aftercare following surgery on the circulatory system: Secondary | ICD-10-CM | POA: Diagnosis not present

## 2015-03-02 DIAGNOSIS — J449 Chronic obstructive pulmonary disease, unspecified: Secondary | ICD-10-CM | POA: Diagnosis not present

## 2015-03-02 DIAGNOSIS — I4891 Unspecified atrial fibrillation: Secondary | ICD-10-CM | POA: Diagnosis not present

## 2015-03-02 DIAGNOSIS — E785 Hyperlipidemia, unspecified: Secondary | ICD-10-CM | POA: Diagnosis not present

## 2015-03-02 NOTE — Telephone Encounter (Signed)
Lafonda MossesDiana from [facility] called to go over medications for patient.  He had several at home that were discontinued at discharge.  She will make sure he can't accidentally take them.   Wilburt FinlayHAGER, Stepahnie Campo PAC

## 2015-03-04 DIAGNOSIS — Z48812 Encounter for surgical aftercare following surgery on the circulatory system: Secondary | ICD-10-CM | POA: Diagnosis not present

## 2015-03-04 DIAGNOSIS — I2510 Atherosclerotic heart disease of native coronary artery without angina pectoris: Secondary | ICD-10-CM | POA: Diagnosis not present

## 2015-03-04 DIAGNOSIS — I4891 Unspecified atrial fibrillation: Secondary | ICD-10-CM | POA: Diagnosis not present

## 2015-03-04 DIAGNOSIS — I129 Hypertensive chronic kidney disease with stage 1 through stage 4 chronic kidney disease, or unspecified chronic kidney disease: Secondary | ICD-10-CM | POA: Diagnosis not present

## 2015-03-04 DIAGNOSIS — N189 Chronic kidney disease, unspecified: Secondary | ICD-10-CM | POA: Diagnosis not present

## 2015-03-04 DIAGNOSIS — J449 Chronic obstructive pulmonary disease, unspecified: Secondary | ICD-10-CM | POA: Diagnosis not present

## 2015-03-05 DIAGNOSIS — I4891 Unspecified atrial fibrillation: Secondary | ICD-10-CM | POA: Diagnosis not present

## 2015-03-05 DIAGNOSIS — Z48812 Encounter for surgical aftercare following surgery on the circulatory system: Secondary | ICD-10-CM | POA: Diagnosis not present

## 2015-03-05 DIAGNOSIS — I2510 Atherosclerotic heart disease of native coronary artery without angina pectoris: Secondary | ICD-10-CM | POA: Diagnosis not present

## 2015-03-05 DIAGNOSIS — J449 Chronic obstructive pulmonary disease, unspecified: Secondary | ICD-10-CM | POA: Diagnosis not present

## 2015-03-05 DIAGNOSIS — I129 Hypertensive chronic kidney disease with stage 1 through stage 4 chronic kidney disease, or unspecified chronic kidney disease: Secondary | ICD-10-CM | POA: Diagnosis not present

## 2015-03-05 DIAGNOSIS — N189 Chronic kidney disease, unspecified: Secondary | ICD-10-CM | POA: Diagnosis not present

## 2015-03-06 DIAGNOSIS — I2510 Atherosclerotic heart disease of native coronary artery without angina pectoris: Secondary | ICD-10-CM | POA: Diagnosis not present

## 2015-03-06 DIAGNOSIS — J449 Chronic obstructive pulmonary disease, unspecified: Secondary | ICD-10-CM | POA: Diagnosis not present

## 2015-03-06 DIAGNOSIS — I4891 Unspecified atrial fibrillation: Secondary | ICD-10-CM | POA: Diagnosis not present

## 2015-03-06 DIAGNOSIS — I129 Hypertensive chronic kidney disease with stage 1 through stage 4 chronic kidney disease, or unspecified chronic kidney disease: Secondary | ICD-10-CM | POA: Diagnosis not present

## 2015-03-06 DIAGNOSIS — Z48812 Encounter for surgical aftercare following surgery on the circulatory system: Secondary | ICD-10-CM | POA: Diagnosis not present

## 2015-03-06 DIAGNOSIS — N189 Chronic kidney disease, unspecified: Secondary | ICD-10-CM | POA: Diagnosis not present

## 2015-03-07 DIAGNOSIS — I129 Hypertensive chronic kidney disease with stage 1 through stage 4 chronic kidney disease, or unspecified chronic kidney disease: Secondary | ICD-10-CM | POA: Diagnosis not present

## 2015-03-07 DIAGNOSIS — N189 Chronic kidney disease, unspecified: Secondary | ICD-10-CM | POA: Diagnosis not present

## 2015-03-07 DIAGNOSIS — Z48812 Encounter for surgical aftercare following surgery on the circulatory system: Secondary | ICD-10-CM | POA: Diagnosis not present

## 2015-03-07 DIAGNOSIS — I4891 Unspecified atrial fibrillation: Secondary | ICD-10-CM | POA: Diagnosis not present

## 2015-03-07 DIAGNOSIS — I2510 Atherosclerotic heart disease of native coronary artery without angina pectoris: Secondary | ICD-10-CM | POA: Diagnosis not present

## 2015-03-07 DIAGNOSIS — J449 Chronic obstructive pulmonary disease, unspecified: Secondary | ICD-10-CM | POA: Diagnosis not present

## 2015-03-08 DIAGNOSIS — J449 Chronic obstructive pulmonary disease, unspecified: Secondary | ICD-10-CM | POA: Diagnosis not present

## 2015-03-08 DIAGNOSIS — I4891 Unspecified atrial fibrillation: Secondary | ICD-10-CM | POA: Diagnosis not present

## 2015-03-08 DIAGNOSIS — I129 Hypertensive chronic kidney disease with stage 1 through stage 4 chronic kidney disease, or unspecified chronic kidney disease: Secondary | ICD-10-CM | POA: Diagnosis not present

## 2015-03-08 DIAGNOSIS — Z48812 Encounter for surgical aftercare following surgery on the circulatory system: Secondary | ICD-10-CM | POA: Diagnosis not present

## 2015-03-08 DIAGNOSIS — I2510 Atherosclerotic heart disease of native coronary artery without angina pectoris: Secondary | ICD-10-CM | POA: Diagnosis not present

## 2015-03-08 DIAGNOSIS — N189 Chronic kidney disease, unspecified: Secondary | ICD-10-CM | POA: Diagnosis not present

## 2015-03-10 ENCOUNTER — Telehealth: Payer: Self-pay | Admitting: Cardiovascular Disease

## 2015-03-10 DIAGNOSIS — I4891 Unspecified atrial fibrillation: Secondary | ICD-10-CM | POA: Diagnosis not present

## 2015-03-10 DIAGNOSIS — J449 Chronic obstructive pulmonary disease, unspecified: Secondary | ICD-10-CM | POA: Diagnosis not present

## 2015-03-10 DIAGNOSIS — I129 Hypertensive chronic kidney disease with stage 1 through stage 4 chronic kidney disease, or unspecified chronic kidney disease: Secondary | ICD-10-CM | POA: Diagnosis not present

## 2015-03-10 DIAGNOSIS — N189 Chronic kidney disease, unspecified: Secondary | ICD-10-CM | POA: Diagnosis not present

## 2015-03-10 DIAGNOSIS — I2510 Atherosclerotic heart disease of native coronary artery without angina pectoris: Secondary | ICD-10-CM | POA: Diagnosis not present

## 2015-03-10 DIAGNOSIS — Z48812 Encounter for surgical aftercare following surgery on the circulatory system: Secondary | ICD-10-CM | POA: Diagnosis not present

## 2015-03-11 DIAGNOSIS — I4891 Unspecified atrial fibrillation: Secondary | ICD-10-CM | POA: Diagnosis not present

## 2015-03-11 DIAGNOSIS — I2510 Atherosclerotic heart disease of native coronary artery without angina pectoris: Secondary | ICD-10-CM | POA: Diagnosis not present

## 2015-03-11 DIAGNOSIS — Z48812 Encounter for surgical aftercare following surgery on the circulatory system: Secondary | ICD-10-CM | POA: Diagnosis not present

## 2015-03-11 DIAGNOSIS — I129 Hypertensive chronic kidney disease with stage 1 through stage 4 chronic kidney disease, or unspecified chronic kidney disease: Secondary | ICD-10-CM | POA: Diagnosis not present

## 2015-03-11 DIAGNOSIS — N189 Chronic kidney disease, unspecified: Secondary | ICD-10-CM | POA: Diagnosis not present

## 2015-03-11 DIAGNOSIS — J449 Chronic obstructive pulmonary disease, unspecified: Secondary | ICD-10-CM | POA: Diagnosis not present

## 2015-03-13 DIAGNOSIS — J449 Chronic obstructive pulmonary disease, unspecified: Secondary | ICD-10-CM | POA: Diagnosis not present

## 2015-03-13 DIAGNOSIS — N189 Chronic kidney disease, unspecified: Secondary | ICD-10-CM | POA: Diagnosis not present

## 2015-03-13 DIAGNOSIS — Z48812 Encounter for surgical aftercare following surgery on the circulatory system: Secondary | ICD-10-CM | POA: Diagnosis not present

## 2015-03-13 DIAGNOSIS — I2510 Atherosclerotic heart disease of native coronary artery without angina pectoris: Secondary | ICD-10-CM | POA: Diagnosis not present

## 2015-03-13 DIAGNOSIS — I4891 Unspecified atrial fibrillation: Secondary | ICD-10-CM | POA: Diagnosis not present

## 2015-03-13 DIAGNOSIS — I129 Hypertensive chronic kidney disease with stage 1 through stage 4 chronic kidney disease, or unspecified chronic kidney disease: Secondary | ICD-10-CM | POA: Diagnosis not present

## 2015-03-13 NOTE — Telephone Encounter (Signed)
error 

## 2015-03-14 ENCOUNTER — Telehealth: Payer: Self-pay | Admitting: *Deleted

## 2015-03-14 DIAGNOSIS — Z48812 Encounter for surgical aftercare following surgery on the circulatory system: Secondary | ICD-10-CM | POA: Diagnosis not present

## 2015-03-14 DIAGNOSIS — I2510 Atherosclerotic heart disease of native coronary artery without angina pectoris: Secondary | ICD-10-CM | POA: Diagnosis not present

## 2015-03-14 DIAGNOSIS — I129 Hypertensive chronic kidney disease with stage 1 through stage 4 chronic kidney disease, or unspecified chronic kidney disease: Secondary | ICD-10-CM | POA: Diagnosis not present

## 2015-03-14 DIAGNOSIS — I4891 Unspecified atrial fibrillation: Secondary | ICD-10-CM | POA: Diagnosis not present

## 2015-03-14 DIAGNOSIS — J449 Chronic obstructive pulmonary disease, unspecified: Secondary | ICD-10-CM | POA: Diagnosis not present

## 2015-03-14 DIAGNOSIS — N189 Chronic kidney disease, unspecified: Secondary | ICD-10-CM | POA: Diagnosis not present

## 2015-03-14 NOTE — Telephone Encounter (Signed)
Home health PTA, Waynetta SandyBeth, called [phone] to report that patients blood pressure was up this morning  Beth said that his blood pressure was 160/100 and he feels dizzy when standing with B/P changing from 160/100 to 130/80.  Beth, PTA said that he gets his blood pressure checked by a different B/P device each time  A different person takes his blood pressure each time  He normally takes his blood pressure medication at noon and blood pressure is checked in the afternoon.  Today's blood pressure was taken in the morning before his noon blood pressure pills  Said she doesn't think he is taking much fluid.  Asked her if she get him to drink a little more fluid but not to much because his kidney function test  Are slightly elevated.  Patient is able to talk easily on phone according to PTA.  I told her I would call patient after 4pm today to follow up on how he is doing.  His next Home Health visit is not until next week.

## 2015-03-14 NOTE — Telephone Encounter (Signed)
This BP of 160 / 100 is slightly elevated but we would not change medications or doses based on 1 reading. Please have him record his BP and HR on a pad of paper and bring it with him to the office visit  Will address concerns at that time

## 2015-03-14 NOTE — Telephone Encounter (Signed)
Called home health PTA Beth, left VM message to have patient keep record of B/P and  HR reading on a pad of paper at his next OV.  Called patient at home to relay the message and there is no answer or VM

## 2015-03-14 NOTE — Telephone Encounter (Signed)
[facility] Pt sees Nahser there

## 2015-03-14 NOTE — Telephone Encounter (Signed)
Called patient, no answer or VM

## 2015-03-14 NOTE — Telephone Encounter (Signed)
Pt home health nurse calling stating pt bp is a bit high  Today 160/100 Pt's only complaint is that he is lightheaded.   Pt c/o BP issue: STAT if pt c/o blurred vision, one-sided weakness or slurred speech  1. What are your last 5 BP readings?  7/15 160/100 7/12 120/80 7/11 132/82 7/09 132/98 7/07 104/80  2. Are you having any other symptoms (ex. Dizziness, headache, blurred vision, passed out)? Nurse states he only saying light headedness   3. What is your BP issue? Been going up but not sure why

## 2015-03-17 ENCOUNTER — Other Ambulatory Visit: Payer: Self-pay | Admitting: Cardiothoracic Surgery

## 2015-03-17 DIAGNOSIS — I4891 Unspecified atrial fibrillation: Secondary | ICD-10-CM | POA: Diagnosis not present

## 2015-03-17 DIAGNOSIS — J449 Chronic obstructive pulmonary disease, unspecified: Secondary | ICD-10-CM | POA: Diagnosis not present

## 2015-03-17 DIAGNOSIS — Z48812 Encounter for surgical aftercare following surgery on the circulatory system: Secondary | ICD-10-CM | POA: Diagnosis not present

## 2015-03-17 DIAGNOSIS — I129 Hypertensive chronic kidney disease with stage 1 through stage 4 chronic kidney disease, or unspecified chronic kidney disease: Secondary | ICD-10-CM | POA: Diagnosis not present

## 2015-03-17 DIAGNOSIS — I2510 Atherosclerotic heart disease of native coronary artery without angina pectoris: Secondary | ICD-10-CM | POA: Diagnosis not present

## 2015-03-17 DIAGNOSIS — N189 Chronic kidney disease, unspecified: Secondary | ICD-10-CM | POA: Diagnosis not present

## 2015-03-17 DIAGNOSIS — Z952 Presence of prosthetic heart valve: Secondary | ICD-10-CM

## 2015-03-17 NOTE — Progress Notes (Signed)
Cardiology Office Note   Date:  03/18/2015   ID:  [name], DOB 10/09/1931, MRN [id number]  PCP:  Cain Saupe, MD  Cardiologist:  Dr. Delane Ginger     Chief Complaint  Patient presents with  . Aortic Insufficiency    s/p AVR     History of Present Illness: [name] [name] is a 81 y.o. male with a hx of aortic insufficiency, chronic AFib, HTN, HL, OSA, AAA s/p repair in 2007, hypothyroidism, COPD, CKD, s/p traumatic brain injury with craniotomy in 1960.  He was previously followed at [facility] and established with Dr. Delane Ginger earlier this year. He was also evaluated by Dr. Tyrone Sage.  He was hospitalized at [facility] in 08/2014 with increasing dyspnea and decreased exercise tolerance.  Recommendation was to proceed with AVR and CABG. However the patient was severely deconditioned and he was discharged to a SNF to help with rehabilitation. He had marked improvement and decision was made to proceed with aortic valve replacement.  Admitted 6/14-6/23. He underwent bioprosthetic AVR and clipping of the left atrial appendage. Bilateral saphenous veins were unsuitable for grafting.  Creatinine remained stable.  It was noted that his A1c was 6.2 and OP follow up with primary care was recommended.  Post op course was notable for nausea and emesis.  He has a prior hx of pancreatitis.  Lipase was elevated at 82 but gradually declined. [place] of RUQ showed a fatty liver, previous cholecystectomy, and small right pleural effusion. His symptoms resolved. He was started on clear liquids and his diet was advanced as tolerated.  He was restarted on Eliquis on 02/20/2015.  He was DC to inpatient rehab from 6/23-7/2.  Rehab stay was c/b Pseudomonas UTI.   He returns for FU.  He is currently staying at home. He is here today with his son.  His chest soreness has improved. He denies significant dyspnea. He does remain quite weak. He is being seen by home health physical therapy. He is able to walk 300 feet. He denies  orthopnea, PND. He denies significant LE edema. He does describe dizziness. This typically occurs with sitting up or standing. He denies syncope. He sometimes feels his heart rate increased.   Studies/Reports Reviewed Today:  Carotid US 02/07/15 RICA 40-59%; LICA absent  LHC 09/20/14 ([facility]) LM:  Ok LAD:  Insignificant LCx:  Normal RCA:  Dist 70%  Echo 09/18/14 EF 50% Severe AI Trivial TR   Past Medical History  Diagnosis Date  . Atrial fibrillation   . Hypothyroidism   . Plantar fasciitis   . Seasonal allergies   . Decreased testosterone level   . Varicose veins   . Hypertension   . Hypercholesteremia   . Aortic valve insufficiency   . Depression   . Heart murmur   . DVT (deep venous thrombosis) ~ 2013    "BLE"  . OSA (obstructive sleep apnea)     "suppose to wear mask; I throw it off in my sleep" (10/15/2014)  . COPD (chronic obstructive pulmonary disease)   . Emphysema of lung   . History of blood transfusion 1960    "lots; related to an accident"  . Arthritis     "wee bit; knees, elbows" (10/15/2014)  . Anxiety   . Falls frequently     > 20 times in the last year/notes 10/15/2014  . Syncope and collapse "several times"  . Complication of anesthesia     unsure of complications but pt. states there were complications  .  Dysrhythmia     A. Fib  . Coronary artery disease   . Shortness of breath dyspnea   . Chronic kidney disease     "down to ~ 1/2 of their regular use; I see kidney dr. in [place]" (10/15/2014)  . Urine frequency   . Urine incontinence   . Enlarged prostate   . GERD (gastroesophageal reflux disease)   . Difficult intubation     difficult intubation 12/03/09 and 03/27/10 ([facility]); glidescope used 03/27/10    Past Surgical History  Procedure Laterality Date  . Abdominal aortic aneurysm repair  01/2006; 03/10/2006    Hattie Perch 01/12/2011  . Tonsillectomy    . Hernia repair    . Laparoscopic incisional / umbilical / ventral hernia repair  02/2010    VHR  w/mesh/notes 03/28/2010  . Laparoscopic cholecystectomy  08/2009    w/IOC/notes 09/18/2009  . Ercp  11/2009    Hattie Perch 12/05/2009  . Gall stone    . Cardiac catheterization  1990's X 1; 09/2014    no stents  . Nose surgery    . Brain surgery  1960 X 4    "S/P got my skull busted"; pt. states removed internal carotid artery  . Aortic valve replacement N/A 02/11/2015    Procedure: AORTIC VALVE REPLACEMENT (AVR);  Surgeon: [name], MD;  [facility];  Service: Open Heart Surgery;  Laterality: N/A;  . Clipping of atrial appendage N/A 02/11/2015    Procedure: CLIPPING OF ATRIAL APPENDAGE;  Surgeon: [name], MD;  Location: [facility] OR;  Service: Open Heart Surgery;  Laterality: N/A;  . Tee without cardioversion N/A 02/11/2015    Procedure: TRANSESOPHAGEAL ECHOCARDIOGRAM (TEE);  Surgeon: [name], MD;  Location: [facility] OR;  Service: Open Heart Surgery;  Laterality: N/A;     Current Outpatient Prescriptions  Medication Sig Dispense Refill  . acetaminophen (TYLENOL) 325 MG tablet Take 2 tablets (650 mg total) by mouth every 6 (six) hours as needed for mild pain (or Fever >/= 101).    Marland Kitchen albuterol (PROVENTIL HFA;VENTOLIN HFA) 108 (90 BASE) MCG/ACT inhaler Inhale 2 puffs into the lungs every 6 (six) hours as needed for wheezing or shortness of breath.    Marland Kitchen amLODipine (NORVASC) 2.5 MG tablet Take 1 tablet (2.5 mg total) by mouth daily. 30 tablet 11  . apixaban (ELIQUIS) 2.5 MG TABS tablet Take 1 tablet (2.5 mg total) by mouth 2 (two) times daily. 60 tablet 3  . atorvastatin (LIPITOR) 10 MG tablet Take 1 tablet (10 mg total) by mouth daily. 30 tablet 0  . buPROPion (WELLBUTRIN XL) 300 MG 24 hr tablet Take 1 tablet (300 mg total) by mouth daily. 30 tablet 1  . busPIRone (BUSPAR) 15 MG tablet Take 1 tablet (15 mg total) by mouth 2 (two) times daily. 60 tablet 1  . calcium carbonate (TUMS - DOSED IN MG ELEMENTAL CALCIUM) 500 MG chewable tablet Chew 1 tablet by mouth as needed for indigestion  or heartburn.    . clonazePAM (KLONOPIN) 0.5 MG tablet Take 1 tablet (0.5 mg total) by mouth at bedtime. 30 tablet 0  . digoxin (LANOXIN) 0.125 MG tablet Take 125 mcg by mouth daily.  0  . diltiazem (CARDIZEM SR) 60 MG 12 hr capsule Take 1 capsule (60 mg total) by mouth every 12 (twelve) hours. 60 capsule 1  . dutasteride (AVODART) 0.5 MG capsule Take 1 capsule (0.5 mg total) by mouth daily. 30 capsule 1  . escitalopram (LEXAPRO) 20 MG tablet Take 1  tablet (20 mg total) by mouth daily. 30 tablet 1  . Eszopiclone 3 MG TABS Take 3 mg by mouth at bedtime.  0  . fluticasone (FLONASE) 50 MCG/ACT nasal spray Place 1 spray into both nostrils daily. 9.9 g 2  . folic acid (FOLVITE) 1 MG tablet Take 1 tablet (1 mg total) by mouth daily. 30 tablet 1  . lamoTRIgine (LAMICTAL) 100 MG tablet Take 1 tablet (100 mg total) by mouth 2 (two) times daily. Breakfast and dinner 60 tablet 1  . levothyroxine (SYNTHROID, LEVOTHROID) 150 MCG tablet Take 1 tablet (150 mcg total) by mouth daily before breakfast. 30 tablet 1  . Melatonin 3 MG TABS Take 1 tablet by mouth at bedtime.    . traMADol (ULTRAM) 50 MG tablet Take 1 tablet (50 mg total) by mouth every 4 (four) hours as needed for moderate pain. 90 tablet 0   No current facility-administered medications for this visit.    Allergies:   Review of patient's allergies indicates no known allergies.    Social History:  The patient  reports that he quit smoking about 40 years ago. His smoking use included Cigarettes. He has a 44 pack-year smoking history. He has never used smokeless tobacco. He reports that he drinks alcohol. He reports that he does not use illicit drugs.   Family History:  The patient's family history includes Cancer in his sister; Heart disease in his father; Rheum arthritis in his father and mother; Stroke in his sister.    ROS:   Please see the history of present illness.   Review of Systems  Constitution: Positive for malaise/fatigue.    Cardiovascular: Positive for chest pain.  Respiratory: Positive for wheezing.   Musculoskeletal: Positive for back pain.  Gastrointestinal: Positive for constipation.  Neurological: Positive for dizziness and loss of balance.  Psychiatric/Behavioral: Positive for depression. The patient is nervous/anxious.   All other systems reviewed and are negative.     PHYSICAL EXAM: VS:  BP 118/84 mmHg  Pulse 95  Ht 6' (1.829 m)  Wt 245 lb 6.4 oz (111.313 kg)  BMI 33.28 kg/m2    Orthostatic VS for the past 24 hrs:  BP- Lying Pulse- Lying BP- Sitting Pulse- Sitting BP- Standing at 0 minutes Pulse- Standing at 0 minutes  03/18/15 1152 130/84 mmHg 67 123/74 mmHg (!) 47 107/76 mmHg 54    Wt Readings from Last 3 Encounters:  03/18/15 245 lb 6.4 oz (111.313 kg)  02/27/15 255 lb 4.8 oz (115.803 kg)  02/20/15 251 lb 5.2 oz (114 kg)     GEN: Well nourished, well developed, in no acute distress HEENT: normal Neck: no JVD, no masses Cardiac:  Normal S1/S2, RRR; no murmur ,  no rubs or gallops, no edema; bilateral lower extremities with SVG harvesting sites well healed without erythema or discharge Chest: Median sternotomy well-healed without erythema or discharge Respiratory:  clear to auscultation bilaterally, no wheezing, rhonchi or rales. GI: soft, nontender, nondistended, + BS MS: no deformity or atrophy Skin: warm and dry  Neuro:  CNs II-XII intact, Strength and sensation are intact Psych: Normal affect   EKG:  EKG is ordered today.  It demonstrates:   Atrial fibrillation, HR 95, T-wave inversions in 1, 2, aVF, V3-V6, no significant change when compared to prior tracings   Recent Labs: 06/20/2014: Pro B Natriuretic peptide (BNP) 930.1* 10/15/2014: B Natriuretic Peptide 164.8*; TSH 3.763 02/12/2015: Magnesium 2.2 02/21/2015: ALT 37 02/26/2015: BUN 14; Creatinine, Ser 1.66*; Hemoglobin 10.7*; Platelets 369; Potassium  3.7; Sodium 138    Lipid Panel No results found for: CHOL, TRIG, HDL,  CHOLHDL, VLDL, LDLCALC, LDLDIRECT    ASSESSMENT AND PLAN:  Dizziness:  His symptoms sound suggestive of orthostatic intolerance. Orthostatic vital signs were obtained today.  His BP does drop some from lying to standing.  HR is not interpretable.   I will check a BMET and CBC. We discussed possibly starting on compression stockings as long as his wounds are satisfactorily healed in Dr. Dennie Maizes opinion. He is not really interested in wearing compression stockings. I have encouraged him to do so. He does have some palpitations and his heart rate is in the mid 90s on ECG today. I will obtain a 48 hour Holter to assess for adequate heart rate control.  I will decrease his Norvasc to 2.5 mg QD.   Aortic valve insufficiency S/P AVR:  Overall, progressing well since recent aortic valve replacement. He does have some dizziness as outlined above. He is working with physical therapy at home. I have asked him to contact our office for referral to outpatient cardiac rehabilitation once physical therapy has discharged him. We discussed the importance of SBE prophylaxis. Obtain follow-up echocardiogram.  Atrial fibrillation, chronic:  Heart rate fairly well controlled. As noted, given his dizziness, I will obtain a 48 hour Holter. Continue Eliquis. He is over the age of 44 and his creatinine has been greater than 1.5. He is currently on the correct dose of 2.5 mg twice a day. Continue current dose of diltiazem SR 60 mg twice a day and digoxin 0.125 mg daily.  Arrange Digoxin level.   Coronary artery disease involving native coronary artery of native heart without angina pectoris:  As noted, he did not have adequate SVG for grafting. As he recovers, we may need to consider stress Myoview. This would likely be based upon symptoms. If he has symptoms and evidence of inferior ischemia, we may need to consider PCI of his RCA. He is not on aspirin as he is on Eliquis. Continue statin, calcium channel blocker.  Essential  hypertension:  Decrease Norvasc as noted.  Hypercholesteremia:  Continue statin.  CKD (chronic kidney disease), unspecified stage:  Check BMET today.  S/P AAA repair:  Follow-up with vascular surgery as planned.    Medication Changes: Current medicines are reviewed at length with the patient today.  Concerns regarding medicines are as outlined above.  The following changes have been made:   Discontinued Medications   ASPIRIN EC 81 MG EC TABLET    Take 1 tablet (81 mg total) by mouth daily.   PANTOPRAZOLE (PROTONIX) 40 MG TABLET    Take 1 tablet (40 mg total) by mouth daily.   Modified Medications   Modified Medication Previous Medication   AMLODIPINE (NORVASC) 2.5 MG TABLET amLODipine (NORVASC) 5 MG tablet      Take 1 tablet (2.5 mg total) by mouth daily.    Take 1 tablet (5 mg total) by mouth daily.   New Prescriptions   No medications on file     Labs/ tests ordered today include:   Orders Placed This Encounter  Procedures  . Basic Metabolic Panel (BMET)  . CBC w/Diff  . Digoxin level  . EKG 12-Lead  . Echocardiogram     Disposition:   FU with Dr. Delane Ginger in 05/2015 as planned.    Signed, Brynda Rim, MHS 03/18/2015 5:36 PM    [facility] [address] [place], [place], [place]  [address]  Phone: [phone]; Fax: [phone]

## 2015-03-18 ENCOUNTER — Encounter: Payer: Self-pay | Admitting: Physician Assistant

## 2015-03-18 ENCOUNTER — Telehealth: Payer: Self-pay | Admitting: *Deleted

## 2015-03-18 ENCOUNTER — Other Ambulatory Visit: Payer: Self-pay | Admitting: Physician Assistant

## 2015-03-18 ENCOUNTER — Ambulatory Visit (INDEPENDENT_AMBULATORY_CARE_PROVIDER_SITE_OTHER): Payer: Medicare Other | Admitting: Physician Assistant

## 2015-03-18 ENCOUNTER — Ambulatory Visit (INDEPENDENT_AMBULATORY_CARE_PROVIDER_SITE_OTHER): Payer: Medicare Other

## 2015-03-18 VITALS — BP 118/84 | HR 95 | Ht 72.0 in | Wt 245.4 lb

## 2015-03-18 DIAGNOSIS — I351 Nonrheumatic aortic (valve) insufficiency: Secondary | ICD-10-CM | POA: Diagnosis not present

## 2015-03-18 DIAGNOSIS — R42 Dizziness and giddiness: Secondary | ICD-10-CM

## 2015-03-18 DIAGNOSIS — E7800 Pure hypercholesterolemia, unspecified: Secondary | ICD-10-CM

## 2015-03-18 DIAGNOSIS — Z954 Presence of other heart-valve replacement: Secondary | ICD-10-CM | POA: Diagnosis not present

## 2015-03-18 DIAGNOSIS — Z9889 Other specified postprocedural states: Secondary | ICD-10-CM

## 2015-03-18 DIAGNOSIS — I482 Chronic atrial fibrillation, unspecified: Secondary | ICD-10-CM

## 2015-03-18 DIAGNOSIS — N189 Chronic kidney disease, unspecified: Secondary | ICD-10-CM

## 2015-03-18 DIAGNOSIS — Z952 Presence of prosthetic heart valve: Secondary | ICD-10-CM

## 2015-03-18 DIAGNOSIS — Z8679 Personal history of other diseases of the circulatory system: Secondary | ICD-10-CM

## 2015-03-18 DIAGNOSIS — I2510 Atherosclerotic heart disease of native coronary artery without angina pectoris: Secondary | ICD-10-CM

## 2015-03-18 DIAGNOSIS — I25119 Atherosclerotic heart disease of native coronary artery with unspecified angina pectoris: Secondary | ICD-10-CM

## 2015-03-18 DIAGNOSIS — I10 Essential (primary) hypertension: Secondary | ICD-10-CM

## 2015-03-18 MED ORDER — AMLODIPINE BESYLATE 2.5 MG PO TABS: 2.5000 mg | ORAL_TABLET | Freq: Every day | ORAL | Status: DC

## 2015-03-18 NOTE — Telephone Encounter (Signed)
Ree KidaJack PT @[facility]  called asking for verbal orders to extend PT 2x a week for 5 more weeks to work on strebgthening, gait, endurance, and balance.  I called back and gave verbal orders to proceed per office protocol

## 2015-03-18 NOTE — Patient Instructions (Signed)
Medication Instructions:  1. DECREASE NORVASC TO 2.5 MG DAILY; NEW RX WAS SENT IN FOR THE 2.5 MG TABLET  Labwork: 03/20/15 BMET, CBC W/DIFF, DIGOXIN LEVEL; YOU WILL NEED TO HOLD YOUR DIGOXIN THE MORNING OF LAB WORK; ONCE LAB WORK IS DONE YOU WILL THEN TAKE YOUR DIGOXIN  Testing/Procedures: 1. Your physician has requested that you have an echocardiogram. Echocardiography is a painless test that uses sound waves to create images of your heart. It provides your doctor with information about the size and shape of your heart and how well your heart's chambers and valves are working. This procedure takes approximately one hour. There are no restrictions for this procedure.  2. Your physician has recommended that you wear a 48 HOUR holter monitor. Holter monitors are medical devices that record the heart's electrical activity. Doctors most often use these monitors to diagnose arrhythmias. Arrhythmias are problems with the speed or rhythm of the heartbeat. The monitor is a small, portable device. You can wear one while you do your normal daily activities. This is usually used to diagnose what is causing palpitations/syncope (passing out).   Follow-Up: KEEP YOUR APPT WITH DR. [name] 05/08/15  Any Other Special Instructions Will Be Listed Below (If Applicable). 1. PER SCOTT WEAVER, PAC TO ASK DR. Tyrone SageGERHARDT IF OK TO WEAR COMPRESSION STOCKINGS  2. YOU HAVE BEEN GIVEN INSTRUCTIONS ABOUT SBE PROPHYLAXIS:

## 2015-03-19 DIAGNOSIS — I129 Hypertensive chronic kidney disease with stage 1 through stage 4 chronic kidney disease, or unspecified chronic kidney disease: Secondary | ICD-10-CM | POA: Diagnosis not present

## 2015-03-19 DIAGNOSIS — I2510 Atherosclerotic heart disease of native coronary artery without angina pectoris: Secondary | ICD-10-CM | POA: Diagnosis not present

## 2015-03-19 DIAGNOSIS — J449 Chronic obstructive pulmonary disease, unspecified: Secondary | ICD-10-CM | POA: Diagnosis not present

## 2015-03-19 DIAGNOSIS — N189 Chronic kidney disease, unspecified: Secondary | ICD-10-CM | POA: Diagnosis not present

## 2015-03-19 DIAGNOSIS — Z48812 Encounter for surgical aftercare following surgery on the circulatory system: Secondary | ICD-10-CM | POA: Diagnosis not present

## 2015-03-19 DIAGNOSIS — I4891 Unspecified atrial fibrillation: Secondary | ICD-10-CM | POA: Diagnosis not present

## 2015-03-20 ENCOUNTER — Other Ambulatory Visit (INDEPENDENT_AMBULATORY_CARE_PROVIDER_SITE_OTHER): Payer: Medicare Other | Admitting: *Deleted

## 2015-03-20 ENCOUNTER — Ambulatory Visit (HOSPITAL_COMMUNITY): Payer: Medicare Other | Attending: Internal Medicine

## 2015-03-20 ENCOUNTER — Other Ambulatory Visit: Payer: Self-pay

## 2015-03-20 ENCOUNTER — Ambulatory Visit: Payer: Self-pay | Admitting: Cardiothoracic Surgery

## 2015-03-20 DIAGNOSIS — Z952 Presence of prosthetic heart valve: Secondary | ICD-10-CM | POA: Insufficient documentation

## 2015-03-20 DIAGNOSIS — E039 Hypothyroidism, unspecified: Secondary | ICD-10-CM | POA: Insufficient documentation

## 2015-03-20 DIAGNOSIS — I4891 Unspecified atrial fibrillation: Secondary | ICD-10-CM | POA: Diagnosis not present

## 2015-03-20 DIAGNOSIS — I129 Hypertensive chronic kidney disease with stage 1 through stage 4 chronic kidney disease, or unspecified chronic kidney disease: Secondary | ICD-10-CM | POA: Insufficient documentation

## 2015-03-20 DIAGNOSIS — I517 Cardiomegaly: Secondary | ICD-10-CM | POA: Diagnosis not present

## 2015-03-20 DIAGNOSIS — I340 Nonrheumatic mitral (valve) insufficiency: Secondary | ICD-10-CM | POA: Diagnosis not present

## 2015-03-20 DIAGNOSIS — Z954 Presence of other heart-valve replacement: Secondary | ICD-10-CM | POA: Diagnosis not present

## 2015-03-20 DIAGNOSIS — N189 Chronic kidney disease, unspecified: Secondary | ICD-10-CM | POA: Insufficient documentation

## 2015-03-20 DIAGNOSIS — E780 Pure hypercholesterolemia: Secondary | ICD-10-CM | POA: Diagnosis not present

## 2015-03-20 DIAGNOSIS — I482 Chronic atrial fibrillation, unspecified: Secondary | ICD-10-CM

## 2015-03-20 DIAGNOSIS — R42 Dizziness and giddiness: Secondary | ICD-10-CM

## 2015-03-20 DIAGNOSIS — G4733 Obstructive sleep apnea (adult) (pediatric): Secondary | ICD-10-CM | POA: Diagnosis not present

## 2015-03-20 LAB — CBC WITH DIFFERENTIAL/PLATELET
Basophils Absolute: 0 10*3/uL (ref 0.0–0.1)
Basophils Relative: 0.4 % (ref 0.0–3.0)
EOS PCT: 2.3 % (ref 0.0–5.0)
Eosinophils Absolute: 0.2 10*3/uL (ref 0.0–0.7)
HEMATOCRIT: 34.7 % — AB (ref 39.0–52.0)
HEMOGLOBIN: 11.4 g/dL — AB (ref 13.0–17.0)
LYMPHS PCT: 32.1 % (ref 12.0–46.0)
Lymphs Abs: 2.4 10*3/uL (ref 0.7–4.0)
MCHC: 32.9 g/dL (ref 30.0–36.0)
MCV: 93.3 fl (ref 78.0–100.0)
MONOS PCT: 5.2 % (ref 3.0–12.0)
Monocytes Absolute: 0.4 10*3/uL (ref 0.1–1.0)
NEUTROS ABS: 4.4 10*3/uL (ref 1.4–7.7)
NEUTROS PCT: 60 % (ref 43.0–77.0)
Platelets: 252 10*3/uL (ref 150.0–400.0)
RBC: 3.72 Mil/uL — AB (ref 4.22–5.81)
RDW: 15.1 % (ref 11.5–15.5)
WBC: 7.4 10*3/uL (ref 4.0–10.5)

## 2015-03-20 LAB — BASIC METABOLIC PANEL
BUN: 22 mg/dL (ref 6–23)
CALCIUM: 9.3 mg/dL (ref 8.4–10.5)
CHLORIDE: 105 meq/L (ref 96–112)
CO2: 31 meq/L (ref 19–32)
CREATININE: 1.81 mg/dL — AB (ref 0.40–1.50)
GFR: 38.39 mL/min — ABNORMAL LOW (ref >60.00)
GLUCOSE: 89 mg/dL (ref 70–99)
Potassium: 4.7 mEq/L (ref 3.5–5.1)
SODIUM: 141 meq/L (ref 135–145)

## 2015-03-20 NOTE — Addendum Note (Signed)
Addended by: Tonita Phoenix on: 03/20/2015 10:06 AM   Modules accepted: Orders

## 2015-03-21 ENCOUNTER — Telehealth: Payer: Self-pay | Admitting: *Deleted

## 2015-03-21 ENCOUNTER — Encounter: Payer: Self-pay | Admitting: Physician Assistant

## 2015-03-21 DIAGNOSIS — I2510 Atherosclerotic heart disease of native coronary artery without angina pectoris: Secondary | ICD-10-CM | POA: Diagnosis not present

## 2015-03-21 DIAGNOSIS — I129 Hypertensive chronic kidney disease with stage 1 through stage 4 chronic kidney disease, or unspecified chronic kidney disease: Secondary | ICD-10-CM | POA: Diagnosis not present

## 2015-03-21 DIAGNOSIS — I4891 Unspecified atrial fibrillation: Secondary | ICD-10-CM | POA: Diagnosis not present

## 2015-03-21 DIAGNOSIS — J449 Chronic obstructive pulmonary disease, unspecified: Secondary | ICD-10-CM | POA: Diagnosis not present

## 2015-03-21 DIAGNOSIS — Z48812 Encounter for surgical aftercare following surgery on the circulatory system: Secondary | ICD-10-CM | POA: Diagnosis not present

## 2015-03-21 DIAGNOSIS — N189 Chronic kidney disease, unspecified: Secondary | ICD-10-CM | POA: Diagnosis not present

## 2015-03-21 LAB — DIGOXIN LEVEL: Digoxin Level: <0.5 ug/L — ABNORMAL LOW (ref 0.8–2.0)

## 2015-03-21 MED ORDER — METOPROLOL SUCCINATE ER 50 MG PO TB24: 50.0000 mg | ORAL_TABLET | Freq: Every day | ORAL | Status: DC

## 2015-03-21 NOTE — Telephone Encounter (Signed)
Pt notified of echo and lab results by phone with verbal understanding. Pt advised to d/c diltiazem due to low EF and start toprol xl 50 mg daily; Rx sent in for the Toprol xl. Scheduled pt f/u PA/Dr. Elease Hashimoto 8/10 per PA.Marland Kitchen

## 2015-03-24 ENCOUNTER — Ambulatory Visit: Payer: Self-pay

## 2015-03-24 DIAGNOSIS — J449 Chronic obstructive pulmonary disease, unspecified: Secondary | ICD-10-CM | POA: Diagnosis not present

## 2015-03-24 DIAGNOSIS — Z48812 Encounter for surgical aftercare following surgery on the circulatory system: Secondary | ICD-10-CM | POA: Diagnosis not present

## 2015-03-24 DIAGNOSIS — I4891 Unspecified atrial fibrillation: Secondary | ICD-10-CM | POA: Diagnosis not present

## 2015-03-24 DIAGNOSIS — I2510 Atherosclerotic heart disease of native coronary artery without angina pectoris: Secondary | ICD-10-CM | POA: Diagnosis not present

## 2015-03-24 DIAGNOSIS — N189 Chronic kidney disease, unspecified: Secondary | ICD-10-CM | POA: Diagnosis not present

## 2015-03-24 DIAGNOSIS — I129 Hypertensive chronic kidney disease with stage 1 through stage 4 chronic kidney disease, or unspecified chronic kidney disease: Secondary | ICD-10-CM | POA: Diagnosis not present

## 2015-03-25 ENCOUNTER — Ambulatory Visit: Payer: Self-pay

## 2015-03-25 DIAGNOSIS — J449 Chronic obstructive pulmonary disease, unspecified: Secondary | ICD-10-CM | POA: Diagnosis not present

## 2015-03-25 DIAGNOSIS — I129 Hypertensive chronic kidney disease with stage 1 through stage 4 chronic kidney disease, or unspecified chronic kidney disease: Secondary | ICD-10-CM | POA: Diagnosis not present

## 2015-03-25 DIAGNOSIS — I2510 Atherosclerotic heart disease of native coronary artery without angina pectoris: Secondary | ICD-10-CM | POA: Diagnosis not present

## 2015-03-25 DIAGNOSIS — I4891 Unspecified atrial fibrillation: Secondary | ICD-10-CM | POA: Diagnosis not present

## 2015-03-25 DIAGNOSIS — N189 Chronic kidney disease, unspecified: Secondary | ICD-10-CM | POA: Diagnosis not present

## 2015-03-25 DIAGNOSIS — Z48812 Encounter for surgical aftercare following surgery on the circulatory system: Secondary | ICD-10-CM | POA: Diagnosis not present

## 2015-03-27 ENCOUNTER — Other Ambulatory Visit: Payer: Self-pay | Admitting: Cardiothoracic Surgery

## 2015-03-27 DIAGNOSIS — Z952 Presence of prosthetic heart valve: Secondary | ICD-10-CM

## 2015-03-28 DIAGNOSIS — I4891 Unspecified atrial fibrillation: Secondary | ICD-10-CM | POA: Diagnosis not present

## 2015-03-28 DIAGNOSIS — Z48812 Encounter for surgical aftercare following surgery on the circulatory system: Secondary | ICD-10-CM | POA: Diagnosis not present

## 2015-03-28 DIAGNOSIS — I129 Hypertensive chronic kidney disease with stage 1 through stage 4 chronic kidney disease, or unspecified chronic kidney disease: Secondary | ICD-10-CM | POA: Diagnosis not present

## 2015-03-28 DIAGNOSIS — I2510 Atherosclerotic heart disease of native coronary artery without angina pectoris: Secondary | ICD-10-CM | POA: Diagnosis not present

## 2015-03-28 DIAGNOSIS — N189 Chronic kidney disease, unspecified: Secondary | ICD-10-CM | POA: Diagnosis not present

## 2015-03-28 DIAGNOSIS — J449 Chronic obstructive pulmonary disease, unspecified: Secondary | ICD-10-CM | POA: Diagnosis not present

## 2015-03-31 ENCOUNTER — Ambulatory Visit: Payer: Self-pay

## 2015-04-01 DIAGNOSIS — J449 Chronic obstructive pulmonary disease, unspecified: Secondary | ICD-10-CM | POA: Diagnosis not present

## 2015-04-01 DIAGNOSIS — I129 Hypertensive chronic kidney disease with stage 1 through stage 4 chronic kidney disease, or unspecified chronic kidney disease: Secondary | ICD-10-CM | POA: Diagnosis not present

## 2015-04-01 DIAGNOSIS — I4891 Unspecified atrial fibrillation: Secondary | ICD-10-CM | POA: Diagnosis not present

## 2015-04-01 DIAGNOSIS — N189 Chronic kidney disease, unspecified: Secondary | ICD-10-CM | POA: Diagnosis not present

## 2015-04-01 DIAGNOSIS — Z48812 Encounter for surgical aftercare following surgery on the circulatory system: Secondary | ICD-10-CM | POA: Diagnosis not present

## 2015-04-01 DIAGNOSIS — I2510 Atherosclerotic heart disease of native coronary artery without angina pectoris: Secondary | ICD-10-CM | POA: Diagnosis not present

## 2015-04-02 ENCOUNTER — Ambulatory Visit (INDEPENDENT_AMBULATORY_CARE_PROVIDER_SITE_OTHER): Payer: Self-pay | Admitting: Physician Assistant

## 2015-04-02 ENCOUNTER — Ambulatory Visit
Admission: RE | Admit: 2015-04-02 | Discharge: 2015-04-02 | Disposition: A | Discharge status: Home / Self Care | Payer: Medicare Other | Source: Ambulatory Visit | Attending: Cardiothoracic Surgery | Admitting: Cardiothoracic Surgery

## 2015-04-02 VITALS — BP 124/89 | HR 83 | Resp 16 | Ht 72.0 in | Wt 245.0 lb

## 2015-04-02 DIAGNOSIS — J929 Pleural plaque without asbestos: Secondary | ICD-10-CM | POA: Diagnosis not present

## 2015-04-02 DIAGNOSIS — I517 Cardiomegaly: Secondary | ICD-10-CM | POA: Diagnosis not present

## 2015-04-02 DIAGNOSIS — I4891 Unspecified atrial fibrillation: Secondary | ICD-10-CM

## 2015-04-02 DIAGNOSIS — I351 Nonrheumatic aortic (valve) insufficiency: Secondary | ICD-10-CM

## 2015-04-02 DIAGNOSIS — Z954 Presence of other heart-valve replacement: Secondary | ICD-10-CM

## 2015-04-02 DIAGNOSIS — I25119 Atherosclerotic heart disease of native coronary artery with unspecified angina pectoris: Secondary | ICD-10-CM

## 2015-04-02 DIAGNOSIS — Z952 Presence of prosthetic heart valve: Secondary | ICD-10-CM

## 2015-04-02 NOTE — Progress Notes (Signed)
HPI:  Patient returns for routine postoperative follow-up having undergone AVR on 02/11/2015. The patient's early postoperative recovery while in the hospital was notable for mild pancreatitis, which resolved with rest.  He was deconditioned and discharged to CIR.  Since hospital discharge the patient reports he is doing very well.  He states that he feels good and continues to work with home PT.  He would like to start Cardiac rehab.  He is ambulating without difficulty.  He has resumed driving.  Follow up ECHO at cardiology office shows reduced EF of 20-25% and functioning Aortic Valve.   Current Outpatient Prescriptions  Medication Sig Dispense Refill  . acetaminophen (TYLENOL) 325 MG tablet Take 2 tablets (650 mg total) by mouth every 6 (six) hours as needed for mild pain (or Fever >/= 101).    Marland Kitchen albuterol (PROVENTIL HFA;VENTOLIN HFA) 108 (90 BASE) MCG/ACT inhaler Inhale 2 puffs into the lungs every 6 (six) hours as needed for wheezing or shortness of breath.    Marland Kitchen amLODipine (NORVASC) 2.5 MG tablet Take 1 tablet (2.5 mg total) by mouth daily. 30 tablet 11  . apixaban (ELIQUIS) 2.5 MG TABS tablet Take 1 tablet (2.5 mg total) by mouth 2 (two) times daily. 60 tablet 3  . atorvastatin (LIPITOR) 10 MG tablet Take 1 tablet (10 mg total) by mouth daily. 30 tablet 0  . buPROPion (WELLBUTRIN XL) 300 MG 24 hr tablet Take 1 tablet (300 mg total) by mouth daily. 30 tablet 1  . busPIRone (BUSPAR) 15 MG tablet Take 1 tablet (15 mg total) by mouth 2 (two) times daily. 60 tablet 1  . calcium carbonate (TUMS - DOSED IN MG ELEMENTAL CALCIUM) 500 MG chewable tablet Chew 1 tablet by mouth as needed for indigestion or heartburn.    . clonazePAM (KLONOPIN) 0.5 MG tablet Take 1 tablet (0.5 mg total) by mouth at bedtime. 30 tablet 0  . digoxin (LANOXIN) 0.125 MG tablet Take 125 mcg by mouth daily.  0  . dutasteride (AVODART) 0.5 MG capsule Take 1 capsule (0.5 mg total) by mouth daily. 30 capsule 1  . escitalopram  (LEXAPRO) 20 MG tablet Take 1 tablet (20 mg total) by mouth daily. 30 tablet 1  . Eszopiclone 3 MG TABS Take 3 mg by mouth at bedtime.  0  . fluticasone (FLONASE) 50 MCG/ACT nasal spray Place 1 spray into both nostrils daily. 9.9 g 2  . folic acid (FOLVITE) 1 MG tablet Take 1 tablet (1 mg total) by mouth daily. 30 tablet 1  . lamoTRIgine (LAMICTAL) 100 MG tablet Take 1 tablet (100 mg total) by mouth 2 (two) times daily. Breakfast and dinner 60 tablet 1  . levothyroxine (SYNTHROID, LEVOTHROID) 150 MCG tablet Take 1 tablet (150 mcg total) by mouth daily before breakfast. 30 tablet 1  . Melatonin 3 MG TABS Take 1 tablet by mouth at bedtime.    . metoprolol succinate (TOPROL-XL) 50 MG 24 hr tablet Take 1 tablet (50 mg total) by mouth daily. Take with or immediately following a meal. 30 tablet 11  . traMADol (ULTRAM) 50 MG tablet Take 1 tablet (50 mg total) by mouth every 4 (four) hours as needed for moderate pain. 90 tablet 0   No current facility-administered medications for this visit.    Physical Exam:  BP 124/89 mmHg  Pulse 83  Resp 16  Ht 6' (1.829 m)  Wt 245 lb (111.131 kg)  BMI 33.22 kg/m2  SpO2 97%  Gen: no apparent distress Heart: Irregular Lungs: CTA  bilaterally Abd: soft non-tender, non-distended Skin: incisions well healed  Diagnostic Tests:  CXR: no pleural effusions/atelectasis, post surgical changes  A/P:  1. S/P AVR doing very well- Cardiology has stopped patient's Cardizem and started Lopressor due to low EF 2. Chronic A. Fib, on [place] 3. Referral for cardiac rehab, patient instructed he may increase activity level as tolerated 4. RTC in 3 months   BARRETT, ERIN, PA-C [facility] [phone]

## 2015-04-03 DIAGNOSIS — I129 Hypertensive chronic kidney disease with stage 1 through stage 4 chronic kidney disease, or unspecified chronic kidney disease: Secondary | ICD-10-CM | POA: Diagnosis not present

## 2015-04-03 DIAGNOSIS — N189 Chronic kidney disease, unspecified: Secondary | ICD-10-CM | POA: Diagnosis not present

## 2015-04-03 DIAGNOSIS — I2510 Atherosclerotic heart disease of native coronary artery without angina pectoris: Secondary | ICD-10-CM | POA: Diagnosis not present

## 2015-04-03 DIAGNOSIS — I4891 Unspecified atrial fibrillation: Secondary | ICD-10-CM | POA: Diagnosis not present

## 2015-04-03 DIAGNOSIS — J449 Chronic obstructive pulmonary disease, unspecified: Secondary | ICD-10-CM | POA: Diagnosis not present

## 2015-04-03 DIAGNOSIS — Z48812 Encounter for surgical aftercare following surgery on the circulatory system: Secondary | ICD-10-CM | POA: Diagnosis not present

## 2015-04-07 ENCOUNTER — Ambulatory Visit: Payer: Medicare Other | Admitting: Neurology

## 2015-04-07 DIAGNOSIS — N189 Chronic kidney disease, unspecified: Secondary | ICD-10-CM | POA: Diagnosis not present

## 2015-04-07 DIAGNOSIS — Z48812 Encounter for surgical aftercare following surgery on the circulatory system: Secondary | ICD-10-CM | POA: Diagnosis not present

## 2015-04-07 DIAGNOSIS — I4891 Unspecified atrial fibrillation: Secondary | ICD-10-CM | POA: Diagnosis not present

## 2015-04-07 DIAGNOSIS — I2510 Atherosclerotic heart disease of native coronary artery without angina pectoris: Secondary | ICD-10-CM | POA: Diagnosis not present

## 2015-04-07 DIAGNOSIS — J449 Chronic obstructive pulmonary disease, unspecified: Secondary | ICD-10-CM | POA: Diagnosis not present

## 2015-04-07 DIAGNOSIS — I129 Hypertensive chronic kidney disease with stage 1 through stage 4 chronic kidney disease, or unspecified chronic kidney disease: Secondary | ICD-10-CM | POA: Diagnosis not present

## 2015-04-08 ENCOUNTER — Encounter: Payer: Self-pay | Admitting: Neurology

## 2015-04-08 ENCOUNTER — Ambulatory Visit (INDEPENDENT_AMBULATORY_CARE_PROVIDER_SITE_OTHER): Payer: Medicare Other | Admitting: Neurology

## 2015-04-08 ENCOUNTER — Telehealth: Payer: Self-pay | Admitting: Cardiovascular Disease

## 2015-04-08 ENCOUNTER — Ambulatory Visit: Payer: Self-pay | Admitting: Neurology

## 2015-04-08 VITALS — BP 138/90 | HR 85 | Ht 72.0 in | Wt 250.2 lb

## 2015-04-08 DIAGNOSIS — W19XXXD Unspecified fall, subsequent encounter: Secondary | ICD-10-CM

## 2015-04-08 DIAGNOSIS — R42 Dizziness and giddiness: Secondary | ICD-10-CM

## 2015-04-08 DIAGNOSIS — R1310 Dysphagia, unspecified: Secondary | ICD-10-CM

## 2015-04-08 DIAGNOSIS — I25119 Atherosclerotic heart disease of native coronary artery with unspecified angina pectoris: Secondary | ICD-10-CM

## 2015-04-08 DIAGNOSIS — R7302 Impaired glucose tolerance (oral): Secondary | ICD-10-CM | POA: Diagnosis not present

## 2015-04-08 DIAGNOSIS — G609 Hereditary and idiopathic neuropathy, unspecified: Secondary | ICD-10-CM | POA: Diagnosis not present

## 2015-04-08 DIAGNOSIS — E538 Deficiency of other specified B group vitamins: Secondary | ICD-10-CM

## 2015-04-08 DIAGNOSIS — R270 Ataxia, unspecified: Secondary | ICD-10-CM | POA: Diagnosis not present

## 2015-04-08 DIAGNOSIS — R251 Tremor, unspecified: Secondary | ICD-10-CM | POA: Diagnosis not present

## 2015-04-08 NOTE — Progress Notes (Signed)
[facility]    Provider:  Dr [name] [name] Referring Provider: Cain Saupe, MD Primary Care Physician:  [name] Saupe, MD  CC: staggering  HPI: [name] [name] is a 81 y.o. male here as a referral from Dr. Jillyn [name] for abnormal gait, tremor. He is s/p AV replacement on 02/11/2015. Not using his walker. He has not fallen recently. He can't get an MRI done because of metal in his head. When he was falling he was getting lightheaded, he fell 15 times in a year, but feels much better since the operation and no more lightheadedness and no more falls. However he feels dizzy in the last week, no problems urinating, no fevers or chills, he hasn't been drinking water but he has been drinking minute maid lemonade. He has been feeling better about his gait except the last week. No weakness, no paresthesias, no vision changes, no dysarthria. He gets "choked" a little bit. He went to rehab and they gave him tongue exercises. He coughs and never knows why, once every few days, feels like it goes down the wrong way and he chokes and coughs. Been going on 2-3 years. Tremors improved.   Initial visit: He is walking and staggers. Started 2 months ago. Happens every time he walks, "constant". He is going to have heart surgery and he went to physical therapy and his walking has improved. Symptoms started after he fell and hurt his hip, he has been falling. Doesn't know why he is falling, he gets "woozy" and he needs bypass and has afib. He has had 12 falls. He refuses to use a walking aid. Doesn't know why he falls. Last time he fell was in rehab a month ago. His legs are so weak that if he comes to a "hump, it pushes me over one way". He has bad knees and that is affecting him as well. No low back pain. Has tremors in his hand but none today and says the tremors have gone away after changed medication. He shuffles a litle if he doesn't know it but normally walks pretty fast and goes up and down stairs. He  can get out of low seats without his hands. No numbness or tingling or burning. Legs get fatigued but no real weakness. Has neck pain, no shooting pain down the arms and no weakness.   Reviewed notes, labs and imaging from outside physicians, which showed: CT of the head showed atrophy and non-specific white matter changes  Review of Systems: Patient complains of symptoms per HPI as well as the following symptoms: no CP, no fever. Pertinent negatives per HPI. All others negative.   History   Social History  . Marital Status: Married    Spouse Name: [name] [name]  . Number of Children: 1  . Years of Education: Master's   Occupational History  . Not on file.   Social History Main Topics  . Smoking status: Former Smoker -- 2.00 packs/day for 22 years    Types: Cigarettes    Quit date: 04/29/1974  . Smokeless tobacco: Never Used  . Alcohol Use: 0.0 oz/week    0 Standard drinks or equivalent per week     Comment: 1 beer per year   . Drug Use: No  . Sexual Activity: Not Currently   Other Topics Concern  . Not on file   Social History Narrative   Lives at home with wife.   Right handed.   Caffeine use: 3 cups coffee per day.   Drinks 4 cups  tea per day   Drinks soda very rarely     Family History  Problem Relation Age of Onset  . Heart disease Father   . Rheum arthritis Mother   . Rheum arthritis Father   . Cancer Sister   . Stroke Sister     Past Medical History  Diagnosis Date  . Atrial fibrillation   . Hypothyroidism   . Plantar fasciitis   . Seasonal allergies   . Decreased testosterone level   . Varicose veins   . Hypertension   . Hypercholesteremia   . Aortic valve insufficiency   . Depression   . Heart murmur   . DVT (deep venous thrombosis) ~ 2013    "BLE"  . OSA (obstructive sleep apnea)     "suppose to wear mask; I throw it off in my sleep" (10/15/2014)  . COPD (chronic obstructive pulmonary disease)   . Emphysema of lung   . History of blood  transfusion 1960    "lots; related to an accident"  . Arthritis     "wee bit; knees, elbows" (10/15/2014)  . Anxiety   . Falls frequently     > 20 times in the last year/notes 10/15/2014  . Syncope and collapse "several times"  . Complication of anesthesia     unsure of complications but pt. states there were complications  . Dysrhythmia     A. Fib  . Coronary artery disease   . Shortness of breath dyspnea   . Chronic kidney disease     "down to ~ 1/2 of their regular use; I see kidney dr. in [place]" (10/15/2014)  . Urine frequency   . Urine incontinence   . Enlarged prostate   . GERD (gastroesophageal reflux disease)   . Difficult intubation     difficult intubation 12/03/09 and 03/27/10 ([facility]); glidescope used 03/27/10  . History of echocardiogram     post AVR >> Echo 7/16:  Mild LVH, EF 20-25%, AVR ok (peak 15 mmHg, mean 9 mmHg), MAC, mild MR, severe LAE, mild reduced RVSF, mild RAE, PASP 35 mmHg  . Cardiomyopathy     Past Surgical History  Procedure Laterality Date  . Abdominal aortic aneurysm repair  01/2006; 03/10/2006    Hattie Perch 01/12/2011  . Tonsillectomy    . Hernia repair    . Laparoscopic incisional / umbilical / ventral hernia repair  02/2010    VHR w/mesh/notes 03/28/2010  . Laparoscopic cholecystectomy  08/2009    w/IOC/notes 09/18/2009  . Ercp  11/2009    Hattie Perch 12/05/2009  . Gall stone    . Cardiac catheterization  1990's X 1; 09/2014    no stents  . Nose surgery    . Brain surgery  1960 X 4    "S/P got my skull busted"; pt. states removed internal carotid artery  . Aortic valve replacement N/A 02/11/2015    Procedure: AORTIC VALVE REPLACEMENT (AVR);  Surgeon: [name], MD;  Location: [facility] OR;  Service: Open Heart Surgery;  Laterality: N/A;  . Clipping of atrial appendage N/A 02/11/2015    Procedure: CLIPPING OF ATRIAL APPENDAGE;  Surgeon: [name], MD;  Location: [facility] OR;  Service: Open Heart Surgery;  Laterality: N/A;  . Tee without cardioversion N/A  02/11/2015    Procedure: TRANSESOPHAGEAL ECHOCARDIOGRAM (TEE);  Surgeon: [name], MD;  Location: [facility] OR;  Service: Open Heart Surgery;  Laterality: N/A;    Current Outpatient Prescriptions  Medication Sig Dispense Refill  . acetaminophen (TYLENOL) 325 MG  tablet Take 2 tablets (650 mg total) by mouth every 6 (six) hours as needed for mild pain (or Fever >/= 101).    Marland Kitchen albuterol (PROVENTIL HFA;VENTOLIN HFA) 108 (90 BASE) MCG/ACT inhaler Inhale 2 puffs into the lungs every 6 (six) hours as needed for wheezing or shortness of breath.    Marland Kitchen amLODipine (NORVASC) 2.5 MG tablet Take 1 tablet (2.5 mg total) by mouth daily. 30 tablet 11  . apixaban (ELIQUIS) 2.5 MG TABS tablet Take 1 tablet (2.5 mg total) by mouth 2 (two) times daily. 60 tablet 3  . atorvastatin (LIPITOR) 10 MG tablet Take 1 tablet (10 mg total) by mouth daily. 30 tablet 0  . buPROPion (WELLBUTRIN XL) 300 MG 24 hr tablet Take 1 tablet (300 mg total) by mouth daily. 30 tablet 1  . busPIRone (BUSPAR) 15 MG tablet Take 1 tablet (15 mg total) by mouth 2 (two) times daily. 60 tablet 1  . calcium carbonate (TUMS - DOSED IN MG ELEMENTAL CALCIUM) 500 MG chewable tablet Chew 1 tablet by mouth as needed for indigestion or heartburn.    . clonazePAM (KLONOPIN) 0.5 MG tablet Take 1 tablet (0.5 mg total) by mouth at bedtime. 30 tablet 0  . digoxin (LANOXIN) 0.125 MG tablet Take 125 mcg by mouth daily.  0  . dutasteride (AVODART) 0.5 MG capsule Take 1 capsule (0.5 mg total) by mouth daily. 30 capsule 1  . escitalopram (LEXAPRO) 20 MG tablet Take 1 tablet (20 mg total) by mouth daily. 30 tablet 1  . Eszopiclone 3 MG TABS Take 3 mg by mouth at bedtime.  0  . fluticasone (FLONASE) 50 MCG/ACT nasal spray Place 1 spray into both nostrils daily. 9.9 g 2  . folic acid (FOLVITE) 1 MG tablet Take 1 tablet (1 mg total) by mouth daily. 30 tablet 1  . lamoTRIgine (LAMICTAL) 100 MG tablet Take 1 tablet (100 mg total) by mouth 2 (two) times daily. Breakfast  and dinner 60 tablet 1  . levothyroxine (SYNTHROID, LEVOTHROID) 150 MCG tablet Take 1 tablet (150 mcg total) by mouth daily before breakfast. 30 tablet 1  . Melatonin 3 MG TABS Take 1 tablet by mouth at bedtime.    . metoprolol succinate (TOPROL-XL) 50 MG 24 hr tablet Take 1 tablet (50 mg total) by mouth daily. Take with or immediately following a meal. 30 tablet 11  . traMADol (ULTRAM) 50 MG tablet Take 1 tablet (50 mg total) by mouth every 4 (four) hours as needed for moderate pain. 90 tablet 0   No current facility-administered medications for this visit.    Allergies as of 04/08/2015  . (No Known Allergies)    Vitals: There were no vitals taken for this visit. Last Weight:  Wt Readings from Last 1 Encounters:  04/02/15 245 lb (111.131 kg)   Last Height:   Ht Readings from Last 1 Encounters:  04/02/15 6' (1.829 m)    Exam: Gen: NAD, conversant, well nourised, obese, well groomed  CV: irregular, +SEM. No Carotid Bruits. No peripheral edema, warm, nontender Eyes: Conjunctivae clear without exudates or hemorrhage  Neuro: Detailed Neurologic Exam  Speech:  Speech is normal; fluent and spontaneous with normal comprehension.  Cognition:  The patient is oriented to person, place, and time;   recent and remote grossly intact;   language fluent;   normal attention, concentration,   fund of knowledge grossly intact Cranial Nerves:  The pupils are equal, round, and reactive to light. The fundi are not visualized due to  small pupils, attempted. Visual fields are full to finger confrontation. Extraocular movements are intact. Trigeminal sensation is intact and the muscles of mastication are normal. The face is symmetric. The palate elevates in the midline. Hearing impaired . Voice is normal. Shoulder shrug is normal. The tongue has normal motion without fasciculations.   Coordination:  No dysmetria  Gait:  Good stride and arm swing. Not  shuffling. Can heel and toe walk. dififculty with tandem.   Motor Observation:  Mild postural tremor  Tone:  Normal muscle tone.   Posture:  Posture is normal.    Strength:  Strength is V/V in the upper and lower limbs.    Sensation: Intact pp Decreased temperature to mid calf in lowers, intact uppers Intact proprioception great toes 6 seconds vibration bilateral great toes   Reflex Exam:  DTR's: Absent achilles otherwise deep tendon reflexes in the upper and lower extremities are brisk bilaterally.  Toes:  The toes are downgoing bilaterally.  Clonus:  Clonus is absent.      Assessment/Plan: 81 year old male with PMHx severe OSA, HTN, hypothyroid, depression, HLD, , glucose intolerance, CRI, pancreatitis, emphysema p/w ataxia, postural tremor, mild distal sensory neuropathy. No parkinsonism. Unclear etiology of falls and ataxia but they have ceased since his AV repair in June. May be multifactorial including polypharmacy, arthritis and knee pain, neuropathy. Falls may have been influenced by his severe aortic insufficiency.  Will order a neuropathy serum screen. given sensory neuropathy and tremor, will order anti-mag antibodies as well. Will hold on emg/ncs at this time since he has improved and is no longer falling. F/u 3 months. He is not using walking aid, he is supposed to. Highly encouraged him as he is a fall risk. He could not get an MRi of the brain/cervical cord due to metal. Since he is improving after cardiac surgery, will hold off and monitor clinically. Swallow study for dysphagia.   Naomie Dean, MD  [facility] [address] [place], [place] [id number] [phone] Fax [phone]  A total of 15 minutes was spent face-to-face with this patient. Over half this time was spent on counseling patient on the ataxia diagnosis and different diagnostic and therapeutic options available.

## 2015-04-08 NOTE — Telephone Encounter (Signed)
New message       Need adv home care orders to continue seeing pt.  Pt has had recent changes to his medication.  They want to extend his visits

## 2015-04-08 NOTE — Patient Instructions (Signed)
Overall you are doing fairly well but I do want to suggest a few things today:   Remember to drink plenty of fluid, eat healthy meals and do not skip any meals. Try to eat protein with a every meal and eat a healthy snack such as fruit or nuts in between meals. Try to keep a regular sleep-wake schedule and try to exercise daily, particularly in the form of walking, 20-30 minutes a day, if you can.  As far as diagnostic testing: labs, swallow study  I would like to see you back in 6 months, sooner if we need to. Please call us with any interim questions, concerns, problems, updates or refill requests.   Please also call us for any test results so we can go over those with you on the phone.  My clinical assistant and will answer any of your questions and relay your messages to me and also relay most of my messages to you.   Our phone number is [phone]. We also have an after hours call service for urgent matters and there is a physician on-call for urgent questions. For any emergencies you know to call 911 or go to the nearest emergency room

## 2015-04-08 NOTE — Progress Notes (Signed)
Cardiology Office Note   Date:  04/09/2015   ID:  [name] [name], [place] 07/30/1932, MRN [id number]  PCP:  Cain Saupe, MD  Cardiologist:  Dr. Delane Ginger     Chief Complaint  Patient presents with  . Congestive Heart Failure  . s/p AVR     History of Present Illness: [name] [name] is a 81 y.o. male with a hx of aortic insufficiency, chronic AFib, HTN, HL, OSA, AAA s/p repair in 2007, hypothyroidism, COPD, CKD, s/p traumatic brain injury with craniotomy in 1960.  He was previously followed at [facility] and established with Dr. Delane Ginger earlier this year. He was also evaluated by Dr. Tyrone Sage.  He was hospitalized at [facility] in 08/2014 with increasing dyspnea and decreased exercise tolerance.  Recommendation was to proceed with AVR and CABG. However the patient was severely deconditioned and he was discharged to a SNF to help with rehabilitation. He had marked improvement and decision was made to proceed with aortic valve replacement.  Admitted 6/16 and underwent bioprosthetic AVR and clipping of the left atrial appendage. Bilateral saphenous veins were unsuitable for grafting.   Post hospital course was complicated by pancreatitis and rehab course was c/b Pseudomonas UTI.  I saw him in FU 03/18/15. He complained of dizziness and his BP did drop with standing.  I decreased his Norvasc to 2.5 mg QD.  I encouraged him to wear compression stockings. I ordered a Holter to assess his HR control.  I do not have results from this yet. His FU echo demonstrated reduced LV function with EF 20-25% and a normally functioning AVR.  I stopped his Diltiazem and placed him on a beta-blocker.  I asked him to FU today with an eye towards starting an ACE inhibitor if his renal function will allow.  He returns for FU.  Of note, he had blood work done yesterday with Dr. Lucia Gaskins to workup neuropathy.  TSH was 86.87.     Studies/Reports Reviewed Today:  Holter 02/2015   Dg Chest 2 View  04/02/2015    IMPRESSION:  1. Prior aortic valve replacement.  Stable cardiomegaly. 2. Basal pleural parenchymal thickening consistent with scarring. No acute pulmonary infiltrate.   Electronically Signed   By: Maisie Fus  Register   On: 04/02/2015 14:20   Echo 03/20/15 Mild LVH, EF 20-25%, AVR ok, mean 9 mmHg, MAC, mild MR, severe LAE, mildly reduced RVSF, mild RAE, PASP 35 mmHg   Carotid US 02/07/15 RICA 40-59%; LICA absent  LHC 09/20/14 ([facility]) LM:  Ok LAD:  Insignificant LCx:  Normal RCA:  Dist 70%  Echo 09/18/14 EF 50% Severe AI Trivial TR   Past Medical History  Diagnosis Date  . Atrial fibrillation   . Hypothyroidism   . Plantar fasciitis   . Seasonal allergies   . Decreased testosterone level   . Varicose veins   . Hypertension   . Hypercholesteremia   . Aortic valve insufficiency   . Depression   . Heart murmur   . DVT (deep venous thrombosis) ~ 2013    "BLE"  . OSA (obstructive sleep apnea)     "suppose to wear mask; I throw it off in my sleep" (10/15/2014)  . COPD (chronic obstructive pulmonary disease)   . Emphysema of lung   . History of blood transfusion 1960    "lots; related to an accident"  . Arthritis     "wee bit; knees, elbows" (10/15/2014)  . Anxiety   . Falls frequently     >  20 times in the last year/notes 10/15/2014  . Syncope and collapse "several times"  . Complication of anesthesia     unsure of complications but pt. states there were complications  . Dysrhythmia     A. Fib  . Coronary artery disease   . Shortness of breath dyspnea   . Chronic kidney disease     "down to ~ 1/2 of their regular use; I see kidney dr. in [place]" (10/15/2014)  . Urine frequency   . Urine incontinence   . Enlarged prostate   . GERD (gastroesophageal reflux disease)   . Difficult intubation     difficult intubation 12/03/09 and 03/27/10 ([facility]); glidescope used 03/27/10  . History of echocardiogram     post AVR >> Echo 7/16:  Mild LVH, EF 20-25%, AVR ok (peak 15 mmHg, mean 9 mmHg), MAC, mild  MR, severe LAE, mild reduced RVSF, mild RAE, PASP 35 mmHg  . Cardiomyopathy     Past Surgical History  Procedure Laterality Date  . Abdominal aortic aneurysm repair  01/2006; 03/10/2006    Hattie Perch 01/12/2011  . Tonsillectomy    . Hernia repair    . Laparoscopic incisional / umbilical / ventral hernia repair  02/2010    VHR w/mesh/notes 03/28/2010  . Laparoscopic cholecystectomy  08/2009    w/IOC/notes 09/18/2009  . Ercp  11/2009    Hattie Perch 12/05/2009  . Gall stone    . Cardiac catheterization  1990's X 1; 09/2014    no stents  . Nose surgery    . Brain surgery  1960 X 4    "S/P got my skull busted"; pt. states removed internal carotid artery  . Aortic valve replacement N/A 02/11/2015    Procedure: AORTIC VALVE REPLACEMENT (AVR);  Surgeon: [name], MD;  Location: [facility] OR;  Service: Open Heart Surgery;  Laterality: N/A;  . Clipping of atrial appendage N/A 02/11/2015    Procedure: CLIPPING OF ATRIAL APPENDAGE;  Surgeon: [name], MD;  Location: [facility] OR;  Service: Open Heart Surgery;  Laterality: N/A;  . Tee without cardioversion N/A 02/11/2015    Procedure: TRANSESOPHAGEAL ECHOCARDIOGRAM (TEE);  Surgeon: [name], MD;  Location: [facility] OR;  Service: Open Heart Surgery;  Laterality: N/A;     Current Outpatient Prescriptions  Medication Sig Dispense Refill  . acetaminophen (TYLENOL) 325 MG tablet Take 2 tablets (650 mg total) by mouth every 6 (six) hours as needed for mild pain (or Fever >/= 101).    Marland Kitchen albuterol (PROVENTIL HFA;VENTOLIN HFA) 108 (90 BASE) MCG/ACT inhaler Inhale 2 puffs into the lungs every 6 (six) hours as needed for wheezing or shortness of breath.    Marland Kitchen amLODipine (NORVASC) 2.5 MG tablet Take 1 tablet (2.5 mg total) by mouth daily. 30 tablet 11  . apixaban (ELIQUIS) 2.5 MG TABS tablet Take 1 tablet (2.5 mg total) by mouth 2 (two) times daily. 60 tablet 3  . atorvastatin (LIPITOR) 10 MG tablet Take 1 tablet (10 mg total) by mouth daily. 30 tablet 0  . buPROPion  (WELLBUTRIN XL) 300 MG 24 hr tablet Take 1 tablet (300 mg total) by mouth daily. 30 tablet 1  . busPIRone (BUSPAR) 15 MG tablet Take 1 tablet (15 mg total) by mouth 2 (two) times daily. 60 tablet 1  . calcium carbonate (TUMS - DOSED IN MG ELEMENTAL CALCIUM) 500 MG chewable tablet Chew 1 tablet by mouth as needed for indigestion or heartburn.    . clonazePAM (KLONOPIN) 0.5 MG tablet Take 1 tablet (  0.5 mg total) by mouth at bedtime. 30 tablet 0  . digoxin (LANOXIN) 0.125 MG tablet Take 125 mcg by mouth daily.  0  . dutasteride (AVODART) 0.5 MG capsule Take 1 capsule (0.5 mg total) by mouth daily. 30 capsule 1  . escitalopram (LEXAPRO) 20 MG tablet Take 1 tablet (20 mg total) by mouth daily. 30 tablet 1  . Eszopiclone 3 MG TABS Take 3 mg by mouth at bedtime.  0  . fluticasone (FLONASE) 50 MCG/ACT nasal spray Place 1 spray into both nostrils daily. 9.9 g 2  . folic acid (FOLVITE) 1 MG tablet Take 1 tablet (1 mg total) by mouth daily. 30 tablet 1  . lamoTRIgine (LAMICTAL) 100 MG tablet Take 1 tablet (100 mg total) by mouth 2 (two) times daily. Breakfast and dinner 60 tablet 1  . levothyroxine (SYNTHROID, LEVOTHROID) 150 MCG tablet Take 1 tablet (150 mcg total) by mouth daily before breakfast. 30 tablet 1  . Melatonin 3 MG TABS Take 1 tablet by mouth at bedtime.    . metoprolol succinate (TOPROL-XL) 50 MG 24 hr tablet Take 1 tablet (50 mg total) by mouth daily. Take with or immediately following a meal. 30 tablet 11  . traMADol (ULTRAM) 50 MG tablet Take 1 tablet (50 mg total) by mouth every 4 (four) hours as needed for moderate pain. 90 tablet 0   No current facility-administered medications for this visit.    Allergies:   Review of patient's allergies indicates no known allergies.    Social History:  The patient  reports that he quit smoking about 40 years ago. His smoking use included Cigarettes. He has a 44 pack-year smoking history. He has never used smokeless tobacco. He reports that he drinks  alcohol. He reports that he does not use illicit drugs.   Family History:  The patient's family history includes Cancer in his sister; Heart disease in his father; Rheum arthritis in his father and mother; Stroke in his sister.    ROS:   Please see the history of present illness.   Review of Systems  All other systems reviewed and are negative.     PHYSICAL EXAM: VS:  There were no vitals taken for this visit.    No data found.   Wt Readings from Last 3 Encounters:  04/08/15 250 lb 3.2 oz (113.49 kg)  04/02/15 245 lb (111.131 kg)  03/18/15 245 lb 6.4 oz (111.313 kg)     GEN: Well nourished, well developed, in no acute distress HEENT: normal Neck: no JVD, no masses Cardiac:  Normal S1/S2, RRR; no murmur ,  no rubs or gallops, no edema; bilateral lower extremities with SVG harvesting sites well healed without erythema or discharge Chest: Median sternotomy well-healed without erythema or discharge Respiratory:  clear to auscultation bilaterally, no wheezing, rhonchi or rales. GI: soft, nontender, nondistended, + BS MS: no deformity or atrophy Skin: warm and dry  Neuro:  CNs II-XII intact, Strength and sensation are intact Psych: Normal affect   EKG:  EKG is ordered today.  It demonstrates:   Atrial fibrillation, HR 95, T-wave inversions in 1, 2, aVF, V3-V6, no significant change when compared to prior tracings   Recent Labs: 06/20/2014: Pro B Natriuretic peptide (BNP) 930.1* 10/15/2014: B Natriuretic Peptide 164.8* 02/12/2015: Magnesium 2.2 03/20/2015: Hemoglobin 11.4*; Platelets 252.0 04/08/2015: ALT 17; BUN 24; Creatinine, Ser 1.79*; Potassium 4.9; Sodium 141; TSH 86.870*    Lipid Panel No results found for: CHOL, TRIG, HDL, CHOLHDL, VLDL, LDLCALC, LDLDIRECT  ASSESSMENT AND PLAN:  Orthostatic Hypotension:  BP at last visit dropped 130/84 >> 107/76 from lying to standing.    Dizziness:  Symptoms are all likely related to orthostatic intolerance. Results of 48 hour  Holter .  Aortic valve insufficiency S/P AVR:  Recent Echo with stable AVR.  Continue SBE prophylaxis.  Dilated Cardiomyopathy:  Probably related to AI.  Could also be related to poorly treated hypothyroidism.  CCB has been DC'd and he is now on beta-blocker.  Given his age, I am not confident his renal function will handle a ACE inhibitor.    Atrial fibrillation, chronic:   Recent Dig level ok.  48 Hr Holter .  Continue beta-blocker, Dig.  Coronary artery disease involving native coronary artery of native heart without angina pectoris:  As noted, he did not have adequate SVG for grafting. As he recovers, we may need to consider stress Myoview. This would likely be based upon symptoms. If he has symptoms and evidence of inferior ischemia, we may need to consider PCI of his RCA. He is not on aspirin as he is on Eliquis. Continue statin, beta-blocker.  Essential hypertension:    Hypercholesteremia:  Continue statin.  CKD (chronic kidney disease), unspecified stage:   Recent Creatinines stable.  S/P AAA repair:  Follow-up with vascular surgery as planned.    Medication Changes: Current medicines are reviewed at length with the patient today.  Concerns regarding medicines are as outlined above.  The following changes have been made:   Discontinued Medications   No medications on file   Modified Medications   No medications on file   New Prescriptions   No medications on file    Labs/ tests ordered today include:   No orders of the defined types were placed in this encounter.     Disposition:   FU with Dr. Delane Ginger in 05/2015 as planned.    Signed, Brynda Rim, MHS 04/09/2015 1:55 PM    [facility] [address] [place], [place], [place]  [address] Phone: [phone]; Fax: [phone]    This encounter was created in error - please disregard.

## 2015-04-09 ENCOUNTER — Encounter: Payer: Self-pay | Admitting: Physician Assistant

## 2015-04-09 DIAGNOSIS — J449 Chronic obstructive pulmonary disease, unspecified: Secondary | ICD-10-CM | POA: Diagnosis not present

## 2015-04-09 DIAGNOSIS — N189 Chronic kidney disease, unspecified: Secondary | ICD-10-CM | POA: Diagnosis not present

## 2015-04-09 DIAGNOSIS — I2510 Atherosclerotic heart disease of native coronary artery without angina pectoris: Secondary | ICD-10-CM | POA: Diagnosis not present

## 2015-04-09 DIAGNOSIS — I129 Hypertensive chronic kidney disease with stage 1 through stage 4 chronic kidney disease, or unspecified chronic kidney disease: Secondary | ICD-10-CM | POA: Diagnosis not present

## 2015-04-09 DIAGNOSIS — I4891 Unspecified atrial fibrillation: Secondary | ICD-10-CM | POA: Diagnosis not present

## 2015-04-09 DIAGNOSIS — Z48812 Encounter for surgical aftercare following surgery on the circulatory system: Secondary | ICD-10-CM | POA: Diagnosis not present

## 2015-04-09 NOTE — Telephone Encounter (Signed)
Left detailed message on voice mail of [name] [name] with [facility] that PT orders were not written by Dr. Elease Hashimoto; I advised that orders likely written post surgery and advised her to call back with questions or concerns.

## 2015-04-10 ENCOUNTER — Telehealth: Payer: Self-pay | Admitting: Nurse Practitioner

## 2015-04-10 DIAGNOSIS — I129 Hypertensive chronic kidney disease with stage 1 through stage 4 chronic kidney disease, or unspecified chronic kidney disease: Secondary | ICD-10-CM | POA: Diagnosis not present

## 2015-04-10 DIAGNOSIS — F332 Major depressive disorder, recurrent severe without psychotic features: Secondary | ICD-10-CM | POA: Diagnosis not present

## 2015-04-10 DIAGNOSIS — I2510 Atherosclerotic heart disease of native coronary artery without angina pectoris: Secondary | ICD-10-CM | POA: Diagnosis not present

## 2015-04-10 DIAGNOSIS — N189 Chronic kidney disease, unspecified: Secondary | ICD-10-CM | POA: Diagnosis not present

## 2015-04-10 DIAGNOSIS — J449 Chronic obstructive pulmonary disease, unspecified: Secondary | ICD-10-CM | POA: Diagnosis not present

## 2015-04-10 DIAGNOSIS — Z48812 Encounter for surgical aftercare following surgery on the circulatory system: Secondary | ICD-10-CM | POA: Diagnosis not present

## 2015-04-10 DIAGNOSIS — F411 Generalized anxiety disorder: Secondary | ICD-10-CM | POA: Diagnosis not present

## 2015-04-10 DIAGNOSIS — F39 Unspecified mood [affective] disorder: Secondary | ICD-10-CM | POA: Diagnosis not present

## 2015-04-10 DIAGNOSIS — I4891 Unspecified atrial fibrillation: Secondary | ICD-10-CM | POA: Diagnosis not present

## 2015-04-10 NOTE — Telephone Encounter (Signed)
I spoke with [name] [name] and advised her that Dr. Elease Hashimoto is willing to sign order for medicine counseling sessions.  I verified our fax number for her and she thanked me for the call.

## 2015-04-10 NOTE — Telephone Encounter (Signed)
Received call from [facility] with [facility] who called to request weekly visits through 8/31 for medicine teaching.  She states the patient has been missing multiple doses of his medications and if approved, will also have the opportunity to meet with a Medical Child psychotherapist.  I advised her that I will discuss with Dr. Elease Hashimoto and will call her back with his response.  She verbalized understanding and agreement.

## 2015-04-11 ENCOUNTER — Encounter: Payer: Self-pay | Admitting: Physician Assistant

## 2015-04-11 LAB — TSH: TSH: 86.87 u[IU]/mL — ABNORMAL HIGH (ref 0.450–4.500)

## 2015-04-11 LAB — MULTIPLE MYELOMA PANEL, SERUM
ALBUMIN/GLOB SERPL: 1.1 (ref 0.7–1.7)
ALPHA2 GLOB SERPL ELPH-MCNC: 0.8 g/dL (ref 0.4–1.0)
Albumin SerPl Elph-Mcnc: 3.7 g/dL (ref 2.9–4.4)
Alpha 1: 0.2 g/dL (ref 0.0–0.4)
B-GLOBULIN SERPL ELPH-MCNC: 1 g/dL (ref 0.7–1.3)
GAMMA GLOB SERPL ELPH-MCNC: 1.3 g/dL (ref 0.4–1.8)
Globulin, Total: 3.4 g/dL (ref 2.2–3.9)
IgA/Immunoglobulin A, Serum: 192 mg/dL (ref 61–437)
IgG (Immunoglobin G), Serum: 1284 mg/dL (ref 700–1600)
IgM (Immunoglobulin M), Srm: 188 mg/dL — ABNORMAL HIGH (ref 15–143)

## 2015-04-11 LAB — ANTI-MYELIN ASSOC. GLY. IGM: Anti-Myelin Assoc. Glycop.IgM*: <1:10 {titer}

## 2015-04-11 LAB — CBC
Hematocrit: 39 % (ref 37.5–51.0)
Hemoglobin: 12.7 g/dL (ref 12.6–17.7)
MCH: 29.7 pg (ref 26.6–33.0)
MCHC: 32.6 g/dL (ref 31.5–35.7)
MCV: 91 fL (ref 79–97)
Platelets: 238 10*3/uL (ref 150–379)
RBC: 4.28 x10E6/uL (ref 4.14–5.80)
RDW: 14.6 % (ref 12.3–15.4)
WBC: 6.3 10*3/uL (ref 3.4–10.8)

## 2015-04-11 LAB — B12 AND FOLATE PANEL
Folate: >20 ng/mL (ref >3.0)
Vitamin B-12: 318 pg/mL (ref 211–946)

## 2015-04-11 LAB — COMPREHENSIVE METABOLIC PANEL
ALT: 17 IU/L (ref 0–44)
AST: 22 IU/L (ref 0–40)
Albumin/Globulin Ratio: 1.4 (ref 1.1–2.5)
Albumin: 4.2 g/dL (ref 3.5–4.7)
Alkaline Phosphatase: 119 IU/L — ABNORMAL HIGH (ref 39–117)
BUN/Creatinine Ratio: 13 (ref 10–22)
BUN: 24 mg/dL (ref 8–27)
Bilirubin Total: 0.2 mg/dL (ref 0.0–1.2)
CHLORIDE: 102 mmol/L (ref 97–108)
CO2: 26 mmol/L (ref 18–29)
Calcium: 9.3 mg/dL (ref 8.6–10.2)
Creatinine, Ser: 1.79 mg/dL — ABNORMAL HIGH (ref 0.76–1.27)
GFR calc Af Amer: 40 mL/min/{1.73_m2} — ABNORMAL LOW (ref >59)
GFR calc non Af Amer: 35 mL/min/{1.73_m2} — ABNORMAL LOW (ref >59)
Globulin, Total: 2.9 g/dL (ref 1.5–4.5)
Glucose: 96 mg/dL (ref 65–99)
Potassium: 4.9 mmol/L (ref 3.5–5.2)
SODIUM: 141 mmol/L (ref 134–144)
Total Protein: 7.1 g/dL (ref 6.0–8.5)

## 2015-04-11 LAB — HEMOGLOBIN A1C
Est. average glucose Bld gHb Est-mCnc: 111 mg/dL
Hgb A1c MFr Bld: 5.5 % (ref 4.8–5.6)

## 2015-04-11 LAB — METHYLMALONIC ACID, SERUM: METHYLMALONIC ACID: 536 nmol/L — AB (ref 0–378)

## 2015-04-13 ENCOUNTER — Telehealth: Payer: Self-pay | Admitting: Neurology

## 2015-04-13 NOTE — Telephone Encounter (Signed)
Tried calling patient twice now to discuss labs. Phone just rang, no answer.   Kara Mead - can you try calling patient as well and let him know his thyroid value was very abnormal. He needs follow up with his pcp or his endocrinologist, whoever manages his thyroid. Please ensure whoever manages his thyroid gets a copy of the TSH value.  Also his B12 was technically in the low-normal range, but his methylmalonic acid was high which indicates that he still may have a B12 deficiency. He should take of B12 daily, he can buy it over the counter.   His HgbA1c improved from 6.2 to 5.5 which is great.His creatinine is stable. His alkaline phosphatase is slightly elevated at 119 (upper limit normal is 117), nothing concerning but is sometimes seen in gallbladder disease if he ever experiences abdominal pain he should mention it to his pcp.   If you can't reach him tomorrow, please send a letter.

## 2015-04-14 NOTE — Telephone Encounter (Signed)
Called phone number listed on DPR form. Just kept ringing. Will try another number.

## 2015-04-14 NOTE — Telephone Encounter (Signed)
Thank you [name] [name]

## 2015-04-14 NOTE — Telephone Encounter (Signed)
Spoke w/ pt  To let him know his thyroid value was abnormal at 86.870 and normal is .45-4.50. He needs to f/u with PCP or endocrinologist. He stated he will contact his PCP. I faxed results to PCP, Cammie Fulp and received fax confirmation.  Told him his B-12 was in low-normal range, but his methylmalonic acid was high which indicates he still may have a B12 deficiency. He should take of B12 daily, he can buy it over the counter per Dr. Lucia Gaskins. Also, his HgbA1c improved from 6.2 to 5.5 which is great.His creatinine is stable. His alkaline phosphatase is slightly elevated at 119 (upper limit normal is 117), nothing concerning but is sometimes seen in gallbladder disease if he ever experiences abdominal pain he should mention it to his pcp. He verbalized understanding.

## 2015-04-17 ENCOUNTER — Telehealth: Payer: Self-pay

## 2015-04-17 DIAGNOSIS — Z48812 Encounter for surgical aftercare following surgery on the circulatory system: Secondary | ICD-10-CM | POA: Diagnosis not present

## 2015-04-17 DIAGNOSIS — I129 Hypertensive chronic kidney disease with stage 1 through stage 4 chronic kidney disease, or unspecified chronic kidney disease: Secondary | ICD-10-CM | POA: Diagnosis not present

## 2015-04-17 DIAGNOSIS — J449 Chronic obstructive pulmonary disease, unspecified: Secondary | ICD-10-CM | POA: Diagnosis not present

## 2015-04-17 DIAGNOSIS — N189 Chronic kidney disease, unspecified: Secondary | ICD-10-CM | POA: Diagnosis not present

## 2015-04-17 DIAGNOSIS — I2510 Atherosclerotic heart disease of native coronary artery without angina pectoris: Secondary | ICD-10-CM | POA: Diagnosis not present

## 2015-04-17 DIAGNOSIS — I4891 Unspecified atrial fibrillation: Secondary | ICD-10-CM | POA: Diagnosis not present

## 2015-04-17 NOTE — Telephone Encounter (Signed)
Thank you :)

## 2015-04-17 NOTE — Telephone Encounter (Signed)
Called patient informed modified barium swallow appt scheduled at [facility] on 04/22/15 @ 11:30. Arrival time @ 11:15, 1st floor, radiology dept, no restrictions. Patient verbalized understanding.

## 2015-04-18 ENCOUNTER — Other Ambulatory Visit (HOSPITAL_COMMUNITY): Payer: Self-pay | Admitting: Neurology

## 2015-04-18 DIAGNOSIS — R1314 Dysphagia, pharyngoesophageal phase: Secondary | ICD-10-CM

## 2015-04-22 ENCOUNTER — Ambulatory Visit (HOSPITAL_COMMUNITY): Admission: RE | Admit: 2015-04-22 | Payer: Medicare Other | Source: Ambulatory Visit

## 2015-04-24 ENCOUNTER — Encounter (HOSPITAL_COMMUNITY)
Admission: RE | Admit: 2015-04-24 | Discharge: 2015-04-24 | Disposition: A | Discharge status: Home / Self Care | Payer: Medicare Other | Source: Ambulatory Visit | Attending: Cardiovascular Disease | Admitting: Cardiovascular Disease

## 2015-04-24 NOTE — Progress Notes (Signed)
Cardiac Rehab Medication Review by a Pharmacist  Does the patient  feel that his/her medications are working for him/her?  no  Has the patient been experiencing any side effects to the medications prescribed?  no  Does the patient measure his/her own blood pressure or blood glucose at home?  no   Does the patient have any problems obtaining medications due to transportation or finances?   no  Understanding of regimen: good Understanding of indications: good Potential of compliance: fair    Pharmacist comments: Pt expresses some confusion over medication regimen.  States that Eliquis (apixiban) said once a day on last prescription.  Informed pt that was incorrect and he should be taking eliquis twice a day.  Instructed pt to compare the medication list with his medication bottles at home and discard any extra bottles of medications that he should not be taking.     Synetta Fail, PharmD  Pharmacy Resident 04/24/2015 8:18 AM

## 2015-04-28 ENCOUNTER — Encounter (HOSPITAL_COMMUNITY): Payer: Medicare Other

## 2015-04-29 NOTE — Patient Outreach (Signed)
[facility]) Care Management  04/29/2015  [name] [name] [name] [name] 1932-01-09 [id number]   Referral from High Risk List, assigned Tyler Deis, RN to outreach.  Thanks, Corrie Mckusick. Sharlee Blew [facility] Care Management [facility] CM Assistant Phone: [phone] Fax: [phone]

## 2015-04-30 ENCOUNTER — Encounter (HOSPITAL_COMMUNITY): Payer: Self-pay

## 2015-04-30 ENCOUNTER — Encounter (HOSPITAL_COMMUNITY)
Admission: RE | Admit: 2015-04-30 | Discharge: 2015-04-30 | Disposition: A | Discharge status: Home / Self Care | Payer: Medicare Other | Source: Ambulatory Visit | Attending: Cardiovascular Disease | Admitting: Cardiovascular Disease

## 2015-04-30 ENCOUNTER — Telehealth: Payer: Self-pay | Admitting: Physician Assistant

## 2015-04-30 NOTE — Progress Notes (Signed)
Pt arrived at cardiac rehab today with atrial fibrillation RVR rates -140-150's.  Pt c/o feeling weak and tired, a little more than usual today.  Pt denies change in medication or regimen with exception of taking tramadol this am for dull headache.  Headache relieved at present time.  Orthostatic BP:  124/80-HR-80 lying, 130/84, HR-88 sitting, 120/80, HR-84 standing.  PC to Ronie Spies, [facility] who advised pt withhold exercise today.    She will review results of recent outpatient holter monitor and consult with Dr. Elease Hashimoto.  Strips faxed to Dr. Elease Hashimoto for review.  Pt instructed he should be notified by Dr. Harvie Bridge office of any new recommendations.  Pt also instructed to not return to cardiac rehab until further advised.  Understanding verbalized.

## 2015-04-30 NOTE — Telephone Encounter (Signed)
Joann from cardiac rehab called with a question about patient's status today. He will be participating in cardiac rehab for recent AVR. (Supposed to have CABG but did not have adequate SVG for grafting.) Today was supposed to be his first day.   Per last office note 03/18/15, he has chronic atrial fibrillation (HR 95 at that visit). He also had recent history of orthostatic intolerance. 48 hour Holter was ordered but I do not have those results available.  Chyrl Civatte says that when patient reported for his first session today, his HR while standing or moving around was in the 140s (afib). When seated, HR was around the 80s. BP 124/80 presently. Orthostatic BP were performed - he was 130/84 sitting and 120/80 standing. He is asymptomatic today.   Since I do not have the results of his recent 48-hour monitor available at the hospital, will forward to Dr. Elease Hashimoto and his nurse to see if they can review in the office to make further adjustments. If no evidence of bradycardia, would consider stopping amlodipine and increasing beta blocker.  Ieesha Abbasi PA-C   .

## 2015-05-01 NOTE — Telephone Encounter (Signed)
[name] [name] may restart cardiac rehab.   Monitor HR

## 2015-05-02 ENCOUNTER — Encounter (HOSPITAL_COMMUNITY): Payer: Medicare Other

## 2015-05-06 ENCOUNTER — Other Ambulatory Visit: Payer: Self-pay

## 2015-05-06 ENCOUNTER — Telehealth (HOSPITAL_COMMUNITY): Payer: Self-pay | Admitting: Cardiac Rehabilitation

## 2015-05-06 NOTE — Patient Outreach (Signed)
[facility]) Care Management  05/06/2015  [name] [name] 02/17/1932 [id number]   Screening call completed this date past referral from High Risk List.  Patient has CAD, chronic atrial fibrillation, HTN, along with chronic kidney disease.  Explanation of  [facility] services provided.  Patient declined home visits but requested telephonic disease management.  Patient is currently going to Cardiac Rehabilitation on Monday-Wednesday-Friday.  Dr. Elease [name] is his cardiologist.  Assessment scheduled for 05-13-15 at 1PM.  Tyler Deis, RN, MSN RN [facility] [phone] Fax [phone]

## 2015-05-06 NOTE — Telephone Encounter (Signed)
pc to pt to advise OK per Dr. Elease Hashimoto to return to cardiac rehab.  Pt also instructed to bring holter monitor from July with him to cardiac rehab to return to Dr. Harvie Bridge office.  Pt states "I think I know where it is".  Pt frequently asked time and location of cardiac rehab and was given location information with directions to use valet parking and guest services.  Understanding verbalized.

## 2015-05-07 ENCOUNTER — Encounter (HOSPITAL_COMMUNITY)
Admission: RE | Admit: 2015-05-07 | Discharge: 2015-05-07 | Disposition: A | Discharge status: Home / Self Care | Payer: Medicare Other | Source: Ambulatory Visit | Attending: Cardiovascular Disease | Admitting: Cardiovascular Disease

## 2015-05-07 NOTE — Progress Notes (Signed)
Pt arrived at cardiac rehab today c/o dizziness and weakness.  Pt reports he awoke this am feeling dizzy. Symptoms worse with position change such as rising from lying to sitting position.  Telemetry- atrial fib 90-105 at rest.  BP:  140/94 HR-95 lying, 104/78, HR-93 sitting, (c/o dizziness with sitting up), 104/70, HR-87 standing (asymptomatic with this position change).  Pt reports he has only eaten eggs today  at 7:30am.  Pt given peanut butter crackers and gatorade.  PC to [facility], Dr. Harvie Bridge nurse to report symptoms.  Pt has appt scheduled tomorrow @ 1:45 with Dr. Elease Hashimoto.  Pt reminded to return holter monitor to [facility] at this appt.  Understanding verbalized. Pt instructed to not return to cardiac rehab until further advised.    Pt TSH abnormally high on 04/08/15 labs per Dr. Clifton Custard.  Pt reports he has not been seen for this.  PC to Dr. Cain Saupe to assess.  Per receptionist appt was previously scheduled on 04/24/15 however it was cancelled by patient.  This appt was rescheduled for tomorrow 05/08/15 @ 4:15.  Receptionist confirmed she has the lab value from Dr. Clifton Custard.  PC to pt to son Hameed Kolar to confirm he will be able to attend these appts with the patient.  The pt appears to be forgetful about appts and medical care at times.  Examples include failing to return holter monitor, rescheduling and missing important appointments.  Pt states he is planning to go with his father and shares these concerns.  Lastly, pt did bring all medication bottles and daily pill reminder with him to cardiac rehab.  There appears to be discrepencies in how medications are taken and prescribed.  Pt apparently is currently taking lisinopril although it was previously discontinued.  Pt reports he started taking it again "a few weeks ago".  Bottle sealed and pill removed from daily pill reminder since it is not listed on his medication list.  Pt brought diltiazem bottle with him although he states he is not currently taking  this and it is not on his medication list.  There are also concerns with how pill reminder is filled.  Some days have pills whereas others do not.  Pt does not have reasonable explanation for missing pills from future slots.  Pt has 2 bottles of lamotrigine   Although he reports and is confirmed that he is only taking 1 pill BID.  Pt is accepting of offer to obtain home health nurse to help with   medication management. Will contact [facility] to assess ability to help in this way.  Message forwarded to Dr. Elease Hashimoto about medication concerns.

## 2015-05-08 ENCOUNTER — Other Ambulatory Visit: Payer: Self-pay | Admitting: Adult Health

## 2015-05-08 ENCOUNTER — Encounter: Payer: Self-pay | Admitting: Cardiovascular Disease

## 2015-05-08 ENCOUNTER — Ambulatory Visit (INDEPENDENT_AMBULATORY_CARE_PROVIDER_SITE_OTHER): Payer: Medicare Other | Admitting: Cardiovascular Disease

## 2015-05-08 VITALS — BP 126/80 | HR 65 | Ht 72.0 in | Wt 252.1 lb

## 2015-05-08 DIAGNOSIS — I10 Essential (primary) hypertension: Secondary | ICD-10-CM

## 2015-05-08 DIAGNOSIS — I25119 Atherosclerotic heart disease of native coronary artery with unspecified angina pectoris: Secondary | ICD-10-CM

## 2015-05-08 DIAGNOSIS — E039 Hypothyroidism, unspecified: Secondary | ICD-10-CM | POA: Diagnosis not present

## 2015-05-08 DIAGNOSIS — N183 Chronic kidney disease, stage 3 (moderate): Secondary | ICD-10-CM | POA: Diagnosis not present

## 2015-05-08 DIAGNOSIS — I2510 Atherosclerotic heart disease of native coronary artery without angina pectoris: Secondary | ICD-10-CM | POA: Diagnosis not present

## 2015-05-08 NOTE — Progress Notes (Signed)
Cardiology Office Note   Date:  05/08/2015   ID:  [name] [name], DOB 05/15/1932, MRN [id number]  PCP:  Cain Saupe, MD  Cardiologist:   Vesta Mixer, MD   No chief complaint on file.  Problem List: 1. Aortic insufficiency- he has severe AI.  - s/p AVR   2. Hyperlipidemia  3. HTN-  4. Obstructive sleep apnea 5. AAA - 2007 , [name] 6. Hypothyroidism 7. COPD  8. Atrial fib:  -    History of Present Illness: [name] [name] is a 81 y.o. male who presents for evaluation for possible aortic valve replacement . He has been followed by the cardiologist at [facility] for years.   He wants to have his AVR here. Has chronic shortness of breath.  Gets worn out taking a load of laundry up the stairs.  Has had recent echos at [facility].  (have been incorporated in our system )   His overall condition has declined over the past 6 months.  Has some balance issues.  Has orthostatic hypotension.   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   Can walk 1/4 mile  Can climb 2 flights of stairs.  No PND or orthopnea.   No syncope.    February 05, 2015: He has been seen by Dr. Tyrone Sage.  The plan is to consider AVR with possible MAZE  next week. He had an echo and cath at [facility] in January   Sept. 8, 2016:  Has had AVR   Past Medical History  Diagnosis Date  . Atrial fibrillation   . Hypothyroidism   . Plantar fasciitis   . Seasonal allergies   . Decreased testosterone level   . Varicose veins   . Hypertension   . Hypercholesteremia   . Aortic valve insufficiency   . Depression   . Heart murmur   . DVT (deep venous thrombosis) ~ 2013    "BLE"  . OSA (obstructive sleep apnea)     "suppose to wear mask; I throw it off in my sleep" (10/15/2014)  . COPD (chronic obstructive pulmonary disease)   . Emphysema of lung   . History of blood transfusion 1960    "lots; related to an accident"    . Arthritis     "wee bit; knees, elbows" (10/15/2014)  . Anxiety   . Falls frequently     > 20 times in the last year/notes 10/15/2014  . Syncope and collapse "several times"  . Complication of anesthesia     unsure of complications but pt. states there were complications  . Dysrhythmia     A. Fib  . Coronary artery disease   . Shortness of breath dyspnea   . Chronic kidney disease     "down to ~ 1/2 of their regular use; I see kidney dr. in [place]" (10/15/2014)  . Urine frequency   . Urine incontinence   . Enlarged prostate   . GERD (gastroesophageal reflux disease)   . Difficult intubation     difficult intubation 12/03/09 and 03/27/10 ([facility]); glidescope used 03/27/10  . History of echocardiogram     post AVR >> Echo 7/16:  Mild LVH, EF 20-25%, AVR ok (peak 15 mmHg, mean 9 mmHg), MAC, mild MR, severe LAE, mild reduced RVSF, mild RAE, PASP 35 mmHg  . Cardiomyopathy     Past Surgical History  Procedure Laterality Date  .  Abdominal aortic aneurysm repair  01/2006; 03/10/2006    Hattie Perch 01/12/2011  . Tonsillectomy    . Hernia repair    . Laparoscopic incisional / umbilical / ventral hernia repair  02/2010    VHR w/mesh/notes 03/28/2010  . Laparoscopic cholecystectomy  08/2009    w/IOC/notes 09/18/2009  . Ercp  11/2009    Hattie Perch 12/05/2009  . Gall stone    . Cardiac catheterization  1990's X 1; 09/2014    no stents  . Nose surgery    . Brain surgery  1960 X 4    "S/P got my skull busted"; pt. states removed internal carotid artery  . Aortic valve replacement N/A 02/11/2015    Procedure: AORTIC VALVE REPLACEMENT (AVR);  Surgeon: [name], MD;  Location[facility] OR;  Service: Open Heart Surgery;  Laterality: N/A;  . Clipping of atrial appendage N/A 02/11/2015    Procedure: CLIPPING OF ATRIAL APPENDAGE;  Surgeon: [name], MD;  Location: [facility] OR;  Service: Open Heart Surgery;  Laterality: N/A;  . Tee without cardioversion N/A 02/11/2015    Procedure: TRANSESOPHAGEAL  ECHOCARDIOGRAM (TEE);  Surgeon: [name], MD;  Location: [facility] OR;  Service: Open Heart Surgery;  Laterality: N/A;     Current Outpatient Prescriptions  Medication Sig Dispense Refill  . albuterol (PROVENTIL HFA;VENTOLIN HFA) 108 (90 BASE) MCG/ACT inhaler Inhale 2 puffs into the lungs every 6 (six) hours as needed for wheezing or shortness of breath.    Marland Kitchen amLODipine (NORVASC) 2.5 MG tablet Take 1 tablet (2.5 mg total) by mouth daily. 30 tablet 11  . apixaban (ELIQUIS) 2.5 MG TABS tablet Take 1 tablet (2.5 mg total) by mouth 2 (two) times daily. 60 tablet 3  . atorvastatin (LIPITOR) 10 MG tablet Take 1 tablet (10 mg total) by mouth daily. 30 tablet 0  . buPROPion (WELLBUTRIN XL) 300 MG 24 hr tablet Take 1 tablet (300 mg total) by mouth daily. 30 tablet 1  . busPIRone (BUSPAR) 15 MG tablet Take 1 tablet (15 mg total) by mouth 2 (two) times daily. 60 tablet 1  . calcium carbonate (TUMS - DOSED IN MG ELEMENTAL CALCIUM) 500 MG chewable tablet Chew 1 tablet by mouth as needed for indigestion or heartburn.    . clonazePAM (KLONOPIN) 0.5 MG tablet Take 1 tablet (0.5 mg total) by mouth at bedtime. 30 tablet 0  . digoxin (LANOXIN) 0.125 MG tablet Take 125 mcg by mouth daily.  0  . dutasteride (AVODART) 0.5 MG capsule Take 1 capsule (0.5 mg total) by mouth daily. 30 capsule 1  . escitalopram (LEXAPRO) 20 MG tablet Take 1 tablet (20 mg total) by mouth daily. 30 tablet 1  . Eszopiclone 3 MG TABS Take 3 mg by mouth at bedtime.  0  . fluticasone (FLONASE) 50 MCG/ACT nasal spray Place 1 spray into both nostrils daily. 9.9 g 2  . folic acid (FOLVITE) 1 MG tablet Take 1 tablet (1 mg total) by mouth daily. 30 tablet 1  . lamoTRIgine (LAMICTAL) 100 MG tablet Take 1 tablet (100 mg total) by mouth 2 (two) times daily. Breakfast and dinner 60 tablet 1  . levothyroxine (SYNTHROID, LEVOTHROID) 150 MCG tablet Take 1 tablet (150 mcg total) by mouth daily before breakfast. 30 tablet 1  . Melatonin 3 MG TABS Take 1  tablet by mouth at bedtime.    . metoprolol succinate (TOPROL-XL) 50 MG 24 hr tablet Take 1 tablet (50 mg total) by mouth daily. Take with or immediately following a  meal. 30 tablet 11  . traMADol (ULTRAM) 50 MG tablet Take 1 tablet (50 mg total) by mouth every 4 (four) hours as needed for moderate pain. 90 tablet 0   No current facility-administered medications for this visit.    Allergies:   Review of patient's allergies indicates no known allergies.    Social History:  The patient  reports that he quit smoking about 41 years ago. His smoking use included Cigarettes. He has a 44 pack-year smoking history. He has never used smokeless tobacco. He reports that he drinks alcohol. He reports that he does not use illicit drugs.   Family History:  The patient's family history includes Cancer in his sister; Heart disease in his father; Rheum arthritis in his father and mother; Stroke in his sister.    ROS:  Please see the history of present illness.    Review of Systems: Constitutional:  denies fever, chills, diaphoresis, appetite change and fatigue.  HEENT: denies photophobia, eye pain, redness, hearing loss, ear pain, congestion, sore throat, rhinorrhea, sneezing, neck pain, neck stiffness and tinnitus.  Respiratory: denies SOB, DOE, cough, chest tightness, and wheezing.  Cardiovascular: denies chest pain, palpitations and leg swelling.  Gastrointestinal: denies nausea, vomiting, abdominal pain, diarrhea, constipation, blood in stool.  Genitourinary: denies dysuria, urgency, frequency, hematuria, flank pain and difficulty urinating.  Musculoskeletal: denies  myalgias, back pain, joint swelling, arthralgias and gait problem.   Skin: denies pallor, rash and wound.  Neurological: denies dizziness, seizures, syncope, weakness, light-headedness, numbness and headaches.   Hematological: denies adenopathy, easy bruising, personal or family bleeding history.  Psychiatric/ Behavioral: denies  suicidal ideation, mood changes, confusion, nervousness, sleep disturbance and agitation.       All other systems are reviewed and negative.    PHYSICAL EXAM: VS:  BP 126/80 mmHg  Pulse 65  Ht 6' (1.829 m)  Wt 114.361 kg (252 lb 1.9 oz)  BMI 34.19 kg/m2 , BMI Body mass index is 34.19 kg/(m^2). GEN: Well nourished, well developed, in no acute distress HEENT: normal Neck: no JVD, carotid bruits, or masses Cardiac: Irreg. Irreg. ; no murmurs, rubs, or gallops,no edema  Respiratory:  clear to auscultation bilaterally, normal work of breathing GI: soft, nontender, nondistended, + BS MS: no deformity or atrophy Skin: warm and dry, no rash Neuro:  Strength and sensation are intact Psych: normal   EKG:  EKG is not ordered today. The ekg ordered Feb. 17, 2016  demonstrates atrial fib with controlled rate.    Recent Labs: 06/20/2014: Pro B Natriuretic peptide (BNP) 930.1* 10/15/2014: B Natriuretic Peptide 164.8* 02/12/2015: Magnesium 2.2 03/20/2015: Hemoglobin 11.4*; Platelets 252.0 04/08/2015: ALT 17; BUN 24; Creatinine, Ser 1.79*; Potassium 4.9; Sodium 141; TSH 86.870*    Lipid Panel No results found for: CHOL, TRIG, HDL, CHOLHDL, VLDL, LDLCALC, LDLDIRECT    Wt Readings from Last 3 Encounters:  05/08/15 114.361 kg (252 lb 1.9 oz)  04/24/15 115.2 kg (253 lb 15.5 oz)  04/08/15 113.49 kg (250 lb 3.2 oz)      Other studies Reviewed: Additional studies/ records that were reviewed today include: . Review of the above records demonstrates:    ASSESSMENT AND PLAN:  1. Aortic insufficiency :  S/p AVR     2. Hyperlipidemia  3. HTN - BP is well controlled. ,  Will continue to hold Lisinopril . His medication list is not accurate. His son accompanied him to the visit today and will be filling his pill boxes.  We will start with the current  meds.  Patient will check daily BP and will send in the readings after 2 weeks and we will make adjustments from there.  Has decreased LV  function  And also has CKD stage 3 so our will have to add meds in carefully   4. Obstructive sleep apnea  5. AAA - 2007 , Cari Caraway  6. Hypothyroidism  7. COPD   8. Atrial fib:  -  He is in chronic atrial fibrillation. He's currently on Eliquis.   Rate is intermittantly high.  will need to stop diltiazem due to his low EF.   Will watch his HR and BP   Current medicines are reviewed at length with the patient today.  The patient does not have concerns regarding medicines.  The following changes have been made:  no change  Labs/ tests ordered today include:  No orders of the defined types were placed in this encounter.     Disposition:   FU with me in 6 weeks .     Signed, Nahser, Deloris Ping, MD  05/08/2015 2:13 PM    [facility] [address] [place], [place], [place]  [address] Phone: [phone]; Fax: [phone]

## 2015-05-08 NOTE — Patient Instructions (Addendum)
Medication Instructions:  STOP Diltiazem STOP Lisinopril   Labwork: None Ordered   Testing/Procedures: None Ordered   Follow-Up: Your physician recommends that you return for follow-up on Tuesday October 11 at 1:45 with Dr. Elease Hashimoto

## 2015-05-09 ENCOUNTER — Encounter (HOSPITAL_COMMUNITY): Payer: Medicare Other

## 2015-05-12 ENCOUNTER — Encounter (HOSPITAL_COMMUNITY)
Admission: RE | Admit: 2015-05-12 | Discharge: 2015-05-12 | Disposition: A | Discharge status: Home / Self Care | Payer: Medicare Other | Source: Ambulatory Visit | Attending: Cardiovascular Disease | Admitting: Cardiovascular Disease

## 2015-05-12 DIAGNOSIS — N183 Chronic kidney disease, stage 3 (moderate): Secondary | ICD-10-CM | POA: Diagnosis not present

## 2015-05-12 DIAGNOSIS — E039 Hypothyroidism, unspecified: Secondary | ICD-10-CM | POA: Diagnosis not present

## 2015-05-12 DIAGNOSIS — R7309 Other abnormal glucose: Secondary | ICD-10-CM | POA: Diagnosis not present

## 2015-05-12 DIAGNOSIS — J449 Chronic obstructive pulmonary disease, unspecified: Secondary | ICD-10-CM | POA: Diagnosis not present

## 2015-05-12 DIAGNOSIS — I10 Essential (primary) hypertension: Secondary | ICD-10-CM | POA: Diagnosis not present

## 2015-05-12 DIAGNOSIS — R748 Abnormal levels of other serum enzymes: Secondary | ICD-10-CM | POA: Diagnosis not present

## 2015-05-12 DIAGNOSIS — I4891 Unspecified atrial fibrillation: Secondary | ICD-10-CM | POA: Diagnosis not present

## 2015-05-12 DIAGNOSIS — Z23 Encounter for immunization: Secondary | ICD-10-CM | POA: Diagnosis not present

## 2015-05-12 NOTE — Progress Notes (Signed)
Pt came this afternoon to exercise.  Blood pressure 122/84. Heart rate 91. Oxygen saturation 96% on room air. [name] says a home health nurse is supposed to come to his home to help review his medications. Mr [name] did not exercise today will check with Dr Elease Hashimoto when the patient can return to exercise. Patient's son called and notified about today's events.

## 2015-05-13 ENCOUNTER — Ambulatory Visit: Payer: Self-pay

## 2015-05-13 ENCOUNTER — Other Ambulatory Visit: Payer: Self-pay

## 2015-05-13 DIAGNOSIS — J441 Chronic obstructive pulmonary disease with (acute) exacerbation: Secondary | ICD-10-CM

## 2015-05-13 NOTE — Patient Outreach (Signed)
[facility]) Care Management  [facility] Care Manager  05/13/2015   [name] [name] July 16, 1932 [id number]  Initial telephonic assessment completed today.   Subjective: Patient reports his main concern is that he is forgetful and doesn't always take his medications as ordered.  States his son filled a medication box for him this week, but he is unsure if he plans to do that each week.  Patient also reports a history of falls.  Objective:   Current Medications:  Current Outpatient Prescriptions  Medication Sig Dispense Refill  . albuterol (PROVENTIL HFA;VENTOLIN HFA) 108 (90 BASE) MCG/ACT inhaler Inhale 2 puffs into the lungs every 6 (six) hours as needed for wheezing or shortness of breath.    Marland Kitchen amLODipine (NORVASC) 2.5 MG tablet Take 1 tablet (2.5 mg total) by mouth daily. 30 tablet 11  . apixaban (ELIQUIS) 2.5 MG TABS tablet Take 1 tablet (2.5 mg total) by mouth 2 (two) times daily. 60 tablet 3  . atorvastatin (LIPITOR) 10 MG tablet Take 1 tablet (10 mg total) by mouth daily. 30 tablet 0  . buPROPion (WELLBUTRIN XL) 300 MG 24 hr tablet Take 1 tablet (300 mg total) by mouth daily. 30 tablet 1  . busPIRone (BUSPAR) 15 MG tablet Take 1 tablet (15 mg total) by mouth 2 (two) times daily. 60 tablet 1  . calcium carbonate (TUMS - DOSED IN MG ELEMENTAL CALCIUM) 500 MG chewable tablet Chew 1 tablet by mouth as needed for indigestion or heartburn.    . clonazePAM (KLONOPIN) 0.5 MG tablet Take 1 tablet (0.5 mg total) by mouth at bedtime. 30 tablet 0  . digoxin (LANOXIN) 0.125 MG tablet Take 125 mcg by mouth daily.  0  . dutasteride (AVODART) 0.5 MG capsule Take 1 capsule (0.5 mg total) by mouth daily. 30 capsule 1  . escitalopram (LEXAPRO) 20 MG tablet Take 1 tablet (20 mg total) by mouth daily. 30 tablet 1  . Eszopiclone 3 MG TABS Take 3 mg by mouth at bedtime.  0  . fluticasone (FLONASE) 50 MCG/ACT nasal spray Place 1 spray into both nostrils daily. 9.9 g 2  . folic acid (FOLVITE)  1 MG tablet Take 1 tablet (1 mg total) by mouth daily. 30 tablet 1  . lamoTRIgine (LAMICTAL) 100 MG tablet Take 1 tablet (100 mg total) by mouth 2 (two) times daily. Breakfast and dinner 60 tablet 1  . levothyroxine (SYNTHROID, LEVOTHROID) 150 MCG tablet Take 1 tablet (150 mcg total) by mouth daily before breakfast. 30 tablet 1  . Melatonin 3 MG TABS Take 1 tablet by mouth at bedtime.    . metoprolol succinate (TOPROL-XL) 50 MG 24 hr tablet Take 1 tablet (50 mg total) by mouth daily. Take with or immediately following a meal. 30 tablet 11  . traMADol (ULTRAM) 50 MG tablet Take 1 tablet (50 mg total) by mouth every 4 (four) hours as needed for moderate pain. 90 tablet 0   No current facility-administered medications for this visit.    Functional Status:  In your present state of health, do you have any difficulty performing the following activities: 05/13/2015 02/21/2015  Hearing? [name] [name]  Vision? Y N  Difficulty concentrating or making decisions? [name] [name]  Walking or climbing stairs? Y Y  Dressing or bathing? N Y  Doing errands, shopping? N -  Preparing Food and eating ? Y -  Using the Toilet? Y -  In the past six months, have you accidently leaked urine? N -  Do you have  problems with loss of bowel control? N -  Managing your Medications? Y -  Managing your Finances? N -  Housekeeping or managing your Housekeeping? N -    Fall/Depression Screening: PHQ 2/9 Scores 05/13/2015 04/30/2015  PHQ - 2 Score 2 3  PHQ- 9 Score 9 19    Assessment: Patient will benefit from assistance in management of his medications and education on fall prevention.   [facility] CM Care Plan Problem One        Most Recent Value   Care Plan Problem One  Chronic disease management needs related to COPD.   Role Documenting the Problem One  Health Coach   Care Plan for Problem One  Active   [facility] Long Term Goal (31-90 days)  Patient stated goal is prevention of unnecessary hospital admissions for the next 60 days.   THN Long  Term Goal Start Date  05/13/15   Interventions for Problem One Long Term Goal  Admitted for disease management services with monthly calls for education and support.   [facility] CM Short Term Goal #1 (0-30 days)  Patient will adher to medication regimen within 30 days.   [facility] CM Short Term Goal #1 Start Date  05/13/15   Interventions for Short Term Goal #1  Educated on use of medication box.  Referral to pharmacy for consult/assistance with medications.   THN CM Short Term Goal #2 (0-30 days)  Patient will not suffer a fall for the next 30 days.   [facility] CM Short Term Goal #2 Start Date  05/13/15   Interventions for Short Term Goal #2  Education provided on fall prevention and safety in the home.  Will Chiropodist.  Encouraged use of cane or walker as needed to prevent falls.      Plan: Patient will buy new BP cuff and start monitoring BP each day.           Patient will weigh daily and record weight.           Patient will use assistive devices as needed to prevent falls.               Health Coach will request pharmacy consult to assist with medication management.           Mail Welcome Packet and [facility] Calendar, along with fall prevention materials.           Follow-up scheduled for October 13th.  Tyler Deis, RN, MSN RN [name] [phone] Fax [phone]

## 2015-05-13 NOTE — Patient Outreach (Signed)
[facility]) Care Management  05/13/2015  [name] [name] 1931-12-26 [id number]   Request from Tyler Deis, RN to assign Pharmacy, assigned Steve Rattler, PharmD.  Thanks, Corrie Mckusick. Sharlee Blew [facility] Care Management [facility] CM Assistant Phone: [phone] Fax: [phone]

## 2015-05-14 ENCOUNTER — Other Ambulatory Visit: Payer: Self-pay

## 2015-05-14 ENCOUNTER — Encounter (HOSPITAL_COMMUNITY): Admission: RE | Admit: 2015-05-14 | Payer: Medicare Other | Source: Ambulatory Visit

## 2015-05-14 NOTE — Patient Outreach (Signed)
I called Mr. Barga to set up an appointment to discuss his medications.  The phone continued to ring.  No one picked up.  I will try calling back later today or tomorrow.   Steve Rattler, PharmD, [employer] [facility] consultant [phone]

## 2015-05-15 ENCOUNTER — Other Ambulatory Visit: Payer: Self-pay | Admitting: Adult Health

## 2015-05-16 ENCOUNTER — Other Ambulatory Visit: Payer: Self-pay | Admitting: Adult Health

## 2015-05-16 ENCOUNTER — Encounter (HOSPITAL_COMMUNITY): Payer: Medicare Other

## 2015-05-16 ENCOUNTER — Telehealth (HOSPITAL_COMMUNITY): Payer: Self-pay | Admitting: Cardiac Rehabilitation

## 2015-05-16 NOTE — Telephone Encounter (Signed)
-----   Message from Tyler Deis, RN sent at 05/08/2015  3:22 PM EDT ----- Regarding: RE: cardiac rehab Hi Joann,  Thank you for the information on [name].  I am scheduled to do a telephone assessment with him next week, and hopefully we can offer services to meet his needs.    We have a pharmacist who can make a home visit to assess problems with his medication management, and make suggestions for managing it better.  Maybe we can teach the son to fill medication boxes for him each week to simplify that process.  Thank you so much.  Elnoria Howard, RN, MSN RN Health Coach [facility] [phone] Fax [phone]   ----- Message -----    From: Robyne Peers, RN    Sent: 05/07/2015   3:37 PM      To: Tyler Deis, RN Subject: cardiac rehab                                  Dear Junious Dresser,  I noticed in EP[facility] you are attempting to work this patient.  I think that would be wonderful as he needs help with medication management.  He is making medication errors in how he takes medications and in filling his daily pill box.  He is also having difficulty remembering important appointments such as returning his holter monitor and following up with his PCP about abnormal TSH.    He has appt tomorrow (05/08/15) with his cardiologist to discuss dizziness and PCP to discuss abnormal TSH.  I spoke to his son who is planning to go with  pt to these appts.  Pt is also concerned about the amount to medications his father is taking and would to have them reduced.    I spoke to [name] today about the importance of having some one assist him with his medications and he agreed.  He realizes it is too much for him.  He stated he is willing to have someone help him.    Please let me know if you are available to assist with home care for this patient.  Are you able to provide this level of care or would he need to reestablish with home health?   Thank you for your  assistance.  Sincerely, Deveron Furlong, RN, BSN Cardiac Pulmonary Rehab

## 2015-05-16 NOTE — Telephone Encounter (Signed)
pc to pt son to discuss pt participation in cardiac rehab.  Due to generalized weakness it is recommended pt should participate in more home health physical therapy prior to cardiac rehab.  Pt son agreed and will notify pt.  Pt PCP Dr. Cain Saupe advised and order for home health PT requested.  Pt son states he filled pt pill box this week however he continues to have questions about pt depression medications.  Son advised to contact prescribing provider to ask specific questions.  Pt son also advised [facility] pharmacist has been assigned.  Understanding verbalized.

## 2015-05-19 ENCOUNTER — Encounter (HOSPITAL_COMMUNITY): Payer: Medicare Other

## 2015-05-21 ENCOUNTER — Encounter (HOSPITAL_COMMUNITY): Payer: Medicare Other

## 2015-05-21 ENCOUNTER — Other Ambulatory Visit: Payer: Self-pay | Admitting: *Deleted

## 2015-05-22 ENCOUNTER — Other Ambulatory Visit: Payer: Self-pay | Admitting: Adult Health

## 2015-05-23 ENCOUNTER — Encounter (HOSPITAL_COMMUNITY): Payer: Medicare Other

## 2015-05-26 ENCOUNTER — Encounter (HOSPITAL_COMMUNITY): Payer: Medicare Other

## 2015-05-26 ENCOUNTER — Telehealth (HOSPITAL_COMMUNITY): Payer: Self-pay | Admitting: Cardiac Rehabilitation

## 2015-05-26 DIAGNOSIS — I872 Venous insufficiency (chronic) (peripheral): Secondary | ICD-10-CM | POA: Diagnosis not present

## 2015-05-26 DIAGNOSIS — R269 Unspecified abnormalities of gait and mobility: Secondary | ICD-10-CM | POA: Diagnosis not present

## 2015-05-26 DIAGNOSIS — I129 Hypertensive chronic kidney disease with stage 1 through stage 4 chronic kidney disease, or unspecified chronic kidney disease: Secondary | ICD-10-CM | POA: Diagnosis not present

## 2015-05-26 DIAGNOSIS — Z952 Presence of prosthetic heart valve: Secondary | ICD-10-CM | POA: Diagnosis not present

## 2015-05-26 DIAGNOSIS — E039 Hypothyroidism, unspecified: Secondary | ICD-10-CM | POA: Diagnosis not present

## 2015-05-26 DIAGNOSIS — I4891 Unspecified atrial fibrillation: Secondary | ICD-10-CM | POA: Diagnosis not present

## 2015-05-26 DIAGNOSIS — E785 Hyperlipidemia, unspecified: Secondary | ICD-10-CM | POA: Diagnosis not present

## 2015-05-26 DIAGNOSIS — F329 Major depressive disorder, single episode, unspecified: Secondary | ICD-10-CM | POA: Diagnosis not present

## 2015-05-26 DIAGNOSIS — M545 Low back pain: Secondary | ICD-10-CM | POA: Diagnosis not present

## 2015-05-26 DIAGNOSIS — N183 Chronic kidney disease, stage 3 (moderate): Secondary | ICD-10-CM | POA: Diagnosis not present

## 2015-05-26 DIAGNOSIS — G894 Chronic pain syndrome: Secondary | ICD-10-CM | POA: Diagnosis not present

## 2015-05-26 DIAGNOSIS — N400 Enlarged prostate without lower urinary tract symptoms: Secondary | ICD-10-CM | POA: Diagnosis not present

## 2015-05-26 DIAGNOSIS — J449 Chronic obstructive pulmonary disease, unspecified: Secondary | ICD-10-CM | POA: Diagnosis not present

## 2015-05-26 DIAGNOSIS — R531 Weakness: Secondary | ICD-10-CM | POA: Diagnosis not present

## 2015-05-26 DIAGNOSIS — K5900 Constipation, unspecified: Secondary | ICD-10-CM | POA: Diagnosis not present

## 2015-05-26 NOTE — Telephone Encounter (Signed)
-----   Message from Vesta Mixer, MD sent at 05/23/2015  4:45 PM EDT ----- Regarding: RE: cardiac rehab Ok . Please delay his start time for several weeks.   Thanks  ----- Message -----    From: Robyne Peers, RN    Sent: 05/23/2015   4:07 PM      To: Vesta Mixer, MD Subject: cardiac rehab                                  Dear Dr. Elease Hashimoto,  Pt is going to hold off on cardiac rehab at this time.  He needs a little more home health physical therapy prior to beginning group exercise.  Hopefully he will be strong enough to begin in a few weeks.   Thank you, Deveron Furlong, RN, BSN Cardiac Pulmonary Rehab

## 2015-05-28 ENCOUNTER — Encounter (HOSPITAL_COMMUNITY): Payer: Medicare Other

## 2015-05-29 ENCOUNTER — Other Ambulatory Visit: Payer: Self-pay | Admitting: Adult Health

## 2015-05-29 DIAGNOSIS — N183 Chronic kidney disease, stage 3 (moderate): Secondary | ICD-10-CM | POA: Diagnosis not present

## 2015-05-29 DIAGNOSIS — I10 Essential (primary) hypertension: Secondary | ICD-10-CM | POA: Diagnosis not present

## 2015-05-29 DIAGNOSIS — E785 Hyperlipidemia, unspecified: Secondary | ICD-10-CM | POA: Diagnosis not present

## 2015-05-30 ENCOUNTER — Telehealth: Payer: Self-pay | Admitting: Cardiovascular Disease

## 2015-05-30 ENCOUNTER — Encounter (HOSPITAL_COMMUNITY): Payer: Medicare Other

## 2015-05-30 DIAGNOSIS — R269 Unspecified abnormalities of gait and mobility: Secondary | ICD-10-CM | POA: Diagnosis not present

## 2015-05-30 DIAGNOSIS — J449 Chronic obstructive pulmonary disease, unspecified: Secondary | ICD-10-CM | POA: Diagnosis not present

## 2015-05-30 DIAGNOSIS — I10 Essential (primary) hypertension: Secondary | ICD-10-CM

## 2015-05-30 DIAGNOSIS — R531 Weakness: Secondary | ICD-10-CM | POA: Diagnosis not present

## 2015-05-30 DIAGNOSIS — I129 Hypertensive chronic kidney disease with stage 1 through stage 4 chronic kidney disease, or unspecified chronic kidney disease: Secondary | ICD-10-CM | POA: Diagnosis not present

## 2015-05-30 DIAGNOSIS — I4891 Unspecified atrial fibrillation: Secondary | ICD-10-CM | POA: Diagnosis not present

## 2015-05-30 DIAGNOSIS — Z952 Presence of prosthetic heart valve: Secondary | ICD-10-CM | POA: Diagnosis not present

## 2015-05-30 MED ORDER — AMLODIPINE BESYLATE 2.5 MG PO TABS: 5.0000 mg | ORAL_TABLET | Freq: Every day | ORAL | Status: DC

## 2015-05-30 NOTE — Telephone Encounter (Signed)
Called and spoke with patient who reports recent blood pressures readings of systolic 160-180 mmHg and diastolic 100-120 mmHg x 1 month and headaches, states he just told the home health care provider about his symptoms.  He denies recent blurred vision.  I reviewed his medications with him and he verified correct dosages.  He is not currently attending cardiac rehab due to need to work more with home health PT.  I advised him that I will discuss with Dr. Elease Hashimoto and call him back in his advice.  He verbalized understanding and agreement.

## 2015-05-30 NOTE — Telephone Encounter (Signed)
Attempted to return call.  Left message on voice mail of [name] [name]

## 2015-05-30 NOTE — Telephone Encounter (Signed)
Dr. Elease Hashimoto advised by telephone that patient increase Amlodipine to 5 mg daily.  His elevated creatinine would inhibit him from starting the patient on HCTZ at this time.  He asked me to ask patient if elevated TSH is being followed by PCP. I called patient and reviewed Dr. Harvie Bridge advice with him.  He verbalized understanding and agreement to increase Amlodipine.  He states he sees his PCP next week and that she has prescribed a medication for the elevated TSH.  I asked him to call me Monday to report BP over the weekend.  He verbalized understanding and agreement.

## 2015-05-30 NOTE — Telephone Encounter (Signed)
Pt c/o BP issue: STAT if pt c/o blurred vision, one-sided weakness or slurred speech  1. What are your last 5 BP readings? 160/100   180/120 after walking  2. Are you having any other symptoms (ex. Dizziness, headache, blurred vision, passed out)?headache  3. What is your BP issue? Very high bp

## 2015-06-02 ENCOUNTER — Encounter (HOSPITAL_COMMUNITY): Payer: Medicare Other

## 2015-06-02 DIAGNOSIS — I129 Hypertensive chronic kidney disease with stage 1 through stage 4 chronic kidney disease, or unspecified chronic kidney disease: Secondary | ICD-10-CM | POA: Diagnosis not present

## 2015-06-02 DIAGNOSIS — R269 Unspecified abnormalities of gait and mobility: Secondary | ICD-10-CM | POA: Diagnosis not present

## 2015-06-02 DIAGNOSIS — Z952 Presence of prosthetic heart valve: Secondary | ICD-10-CM | POA: Diagnosis not present

## 2015-06-02 DIAGNOSIS — J449 Chronic obstructive pulmonary disease, unspecified: Secondary | ICD-10-CM | POA: Diagnosis not present

## 2015-06-02 DIAGNOSIS — I4891 Unspecified atrial fibrillation: Secondary | ICD-10-CM | POA: Diagnosis not present

## 2015-06-02 DIAGNOSIS — R531 Weakness: Secondary | ICD-10-CM | POA: Diagnosis not present

## 2015-06-03 NOTE — Telephone Encounter (Signed)
Attempted to call patient, no answer and no voice mail 

## 2015-06-04 ENCOUNTER — Encounter (HOSPITAL_COMMUNITY): Payer: Medicare Other

## 2015-06-04 DIAGNOSIS — I4891 Unspecified atrial fibrillation: Secondary | ICD-10-CM | POA: Diagnosis not present

## 2015-06-04 DIAGNOSIS — Z952 Presence of prosthetic heart valve: Secondary | ICD-10-CM | POA: Diagnosis not present

## 2015-06-04 DIAGNOSIS — J449 Chronic obstructive pulmonary disease, unspecified: Secondary | ICD-10-CM | POA: Diagnosis not present

## 2015-06-04 DIAGNOSIS — R531 Weakness: Secondary | ICD-10-CM | POA: Diagnosis not present

## 2015-06-04 DIAGNOSIS — I129 Hypertensive chronic kidney disease with stage 1 through stage 4 chronic kidney disease, or unspecified chronic kidney disease: Secondary | ICD-10-CM | POA: Diagnosis not present

## 2015-06-04 DIAGNOSIS — R269 Unspecified abnormalities of gait and mobility: Secondary | ICD-10-CM | POA: Diagnosis not present

## 2015-06-04 NOTE — Telephone Encounter (Signed)
Spoke with patient who states his blood pressure remains elevated.  He reports diastolic readings > 100 mmHg every time he checks his pressure.  He states that his readings are similar to the ones the home health aide gets.  He c/o feeling unsteady on his feet and frequent headaches.  He is scheduled for f/u with Dr. Elease Hashimoto on Tuesday 10/11.  I advised that we can move his appointment to tomorrow for sooner f/u given his consistently high BP readings.  He verbalized understanding and agreement with appointment tomorrow, 10/6 at 11:30.

## 2015-06-05 ENCOUNTER — Ambulatory Visit (INDEPENDENT_AMBULATORY_CARE_PROVIDER_SITE_OTHER): Payer: Medicare Other | Admitting: Cardiovascular Disease

## 2015-06-05 ENCOUNTER — Encounter: Payer: Self-pay | Admitting: Cardiovascular Disease

## 2015-06-05 VITALS — BP 154/94 | HR 110 | Ht 72.0 in | Wt 250.8 lb

## 2015-06-05 DIAGNOSIS — I2510 Atherosclerotic heart disease of native coronary artery without angina pectoris: Secondary | ICD-10-CM

## 2015-06-05 DIAGNOSIS — I25119 Atherosclerotic heart disease of native coronary artery with unspecified angina pectoris: Secondary | ICD-10-CM | POA: Diagnosis not present

## 2015-06-05 DIAGNOSIS — I351 Nonrheumatic aortic (valve) insufficiency: Secondary | ICD-10-CM | POA: Diagnosis not present

## 2015-06-05 DIAGNOSIS — I10 Essential (primary) hypertension: Secondary | ICD-10-CM

## 2015-06-05 LAB — BASIC METABOLIC PANEL
BUN: 24 mg/dL (ref 7–25)
CALCIUM: 9.3 mg/dL (ref 8.6–10.3)
CO2: 24 mmol/L (ref 20–31)
Chloride: 103 mmol/L (ref 98–110)
Creat: 1.69 mg/dL — ABNORMAL HIGH (ref 0.70–1.11)
GLUCOSE: 108 mg/dL — AB (ref 65–99)
POTASSIUM: 4.1 mmol/L (ref 3.5–5.3)
Sodium: 140 mmol/L (ref 135–146)

## 2015-06-05 MED ORDER — CARVEDILOL 25 MG PO TABS: 25.0000 mg | ORAL_TABLET | Freq: Two times a day (BID) | ORAL | Status: DC

## 2015-06-05 NOTE — Progress Notes (Signed)
Cardiology Office Note   Date:  06/05/2015   ID:  [name] [name], DOB 11/03/1931, MRN [id number]  PCP:  [name] Saupe, MD  Cardiologist:   [name] Mixer, MD   Chief Complaint  Patient presents with  . Follow-up    htn, CAD   Problem List: 1. Aortic insufficiency- he has severe AI.  - s/p AVR   2. Hyperlipidemia  3. HTN-  4. Obstructive sleep apnea 5. AAA - 2007 , [name] [name] 6. Hypothyroidism 7. COPD  8. Atrial fib:  -    History of Present Illness: [name] [name] is a 81 y.o. male who presents for evaluation for possible aortic valve replacement . He has been followed by the cardiologist at [facility] for years.   He wants to have his AVR here. Has chronic shortness of breath.  Gets worn out taking a load of laundry up the stairs.  Has had recent echos at [facility].  (have been incorporated in our system )   His overall condition has declined over the past 6 months.  Has some balance issues.  Has orthostatic hypotension.   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   Can walk 1/4 mile  Can climb 2 flights of stairs.  No PND or orthopnea.   No syncope.    February 05, 2015: He has been seen by Dr. Tyrone [name].  The plan is to consider AVR with possible MAZE  next week. He had an echo and cath at [facility] in January   Sept. 8, 2016:  Has had AVR  Doing ok  Oct. 6, 2016:  Pt is s/p AVR,  Had CABG. , atrial fib Persistently elevated BP     Occasionally eats ham and cheese for lunch. Eats frozen dinners for dinner almost every night.    Eats at [facility] if he does not eat the [employer]. Also goes to [employer] once a week.   Has some chest wall pain   Past Medical History  Diagnosis Date  . Atrial fibrillation (HCC)   . Hypothyroidism   . Plantar fasciitis   . Seasonal allergies   . Decreased testosterone level   . Varicose veins   . Hypertension   . Hypercholesteremia    . Aortic valve insufficiency   . Depression   . Heart murmur   . DVT (deep venous thrombosis) (HCC) ~ 2013    "BLE"  . OSA (obstructive sleep apnea)     "suppose to wear mask; I throw it off in my sleep" (10/15/2014)  . COPD (chronic obstructive pulmonary disease) (HCC)   . Emphysema of lung (HCC)   . History of blood transfusion 1960    "lots; related to an accident"  . Arthritis     "wee bit; knees, elbows" (10/15/2014)  . Anxiety   . Falls frequently     > 20 times in the last year/notes 10/15/2014  . Syncope and collapse "several times"  . Complication of anesthesia     unsure of complications but pt. states there were complications  . Dysrhythmia     A. Fib  . Coronary artery disease   . Shortness of breath dyspnea   . Chronic kidney disease     "down to ~ 1/2 of their regular use; I see kidney dr. in [place]" (10/15/2014)  . Urine frequency   . Urine incontinence   .  Enlarged prostate   . GERD (gastroesophageal reflux disease)   . Difficult intubation     difficult intubation 12/03/09 and 03/27/10 ([facility]); glidescope used 03/27/10  . History of echocardiogram     post AVR >> Echo 7/16:  Mild LVH, EF 20-25%, AVR ok (peak 15 mmHg, mean 9 mmHg), MAC, mild MR, severe LAE, mild reduced RVSF, mild RAE, PASP 35 mmHg  . Cardiomyopathy [facility])     Past Surgical History  Procedure Laterality Date  . Abdominal aortic aneurysm repair  01/2006; 03/10/2006    [name] [name] 01/12/2011  . Tonsillectomy    . Hernia repair    . Laparoscopic incisional / umbilical / ventral hernia repair  02/2010    VHR w/mesh/notes 03/28/2010  . Laparoscopic cholecystectomy  08/2009    w/IOC/notes 09/18/2009  . Ercp  11/2009    [name] [name] 12/05/2009  . Gall stone    . Cardiac catheterization  1990's X 1; 09/2014    no stents  . Nose surgery    . Brain surgery  1960 X 4    "S/P got my skull busted"; pt. states removed internal carotid artery  . Aortic valve replacement N/A 02/11/2015    Procedure: AORTIC VALVE  REPLACEMENT (AVR);  Surgeon: [name] Ovens, MD;  Location: [facility] OR;  Service: Open Heart Surgery;  Laterality: N/A;  . Clipping of atrial appendage N/A 02/11/2015    Procedure: CLIPPING OF ATRIAL APPENDAGE;  Surgeon: [name] Ovens, MD;  Location: [facility] OR;  Service: Open Heart Surgery;  Laterality: N/A;  . Tee without cardioversion N/A 02/11/2015    Procedure: TRANSESOPHAGEAL ECHOCARDIOGRAM (TEE);  Surgeon: [name] Ovens, MD;  Location: [facility] OR;  Service: Open Heart Surgery;  Laterality: N/A;     Current Outpatient Prescriptions  Medication Sig Dispense Refill  . albuterol (PROVENTIL HFA;VENTOLIN HFA) 108 (90 BASE) MCG/ACT inhaler Inhale 2 puffs into the lungs every 6 (six) hours as needed for wheezing or shortness of breath.    Marland Kitchen amLODipine (NORVASC) 2.5 MG tablet Take 2 tablets (5 mg total) by mouth daily. 30 tablet 11  . apixaban (ELIQUIS) 5 MG TABS tablet Take 5 mg by mouth 2 (two) times daily.    Marland Kitchen atorvastatin (LIPITOR) 10 MG tablet Take 1 tablet (10 mg total) by mouth daily. 30 tablet 0  . buPROPion (WELLBUTRIN XL) 300 MG 24 hr tablet Take 1 tablet (300 mg total) by mouth daily. 30 tablet 1  . busPIRone (BUSPAR) 15 MG tablet Take 1 tablet (15 mg total) by mouth 2 (two) times daily. 60 tablet 1  . calcium carbonate (TUMS - DOSED IN MG ELEMENTAL CALCIUM) 500 MG chewable tablet Chew 1 tablet by mouth as needed for indigestion or heartburn.    . clonazePAM (KLONOPIN) 0.5 MG tablet Take 1 tablet (0.5 mg total) by mouth at bedtime. 30 tablet 0  . digoxin (LANOXIN) 0.125 MG tablet Take 125 mcg by mouth daily.  0  . dutasteride (AVODART) 0.5 MG capsule Take 1 capsule (0.5 mg total) by mouth daily. 30 capsule 1  . escitalopram (LEXAPRO) 20 MG tablet Take 1 tablet (20 mg total) by mouth daily. 30 tablet 1  . Eszopiclone 3 MG TABS Take 3 mg by mouth at bedtime.  0  . fluticasone (FLONASE) 50 MCG/ACT nasal spray Place 1 spray into both nostrils daily. 9.9 g 2  . folic acid (FOLVITE) 1 MG tablet  Take 1 tablet (1 mg total) by mouth daily. 30 tablet 1  . lamoTRIgine (LAMICTAL) 100 MG tablet  Take 1 tablet (100 mg total) by mouth 2 (two) times daily. Breakfast and dinner 60 tablet 1  . levothyroxine (SYNTHROID, LEVOTHROID) 150 MCG tablet Take 1 tablet (150 mcg total) by mouth daily before breakfast. 30 tablet 1  . Melatonin 3 MG TABS Take 1 tablet by mouth at bedtime.    . metoprolol succinate (TOPROL-XL) 50 MG 24 hr tablet Take 1 tablet (50 mg total) by mouth daily. Take with or immediately following a meal. 30 tablet 11  . traMADol (ULTRAM) 50 MG tablet Take 1 tablet (50 mg total) by mouth every 4 (four) hours as needed for moderate pain. 90 tablet 0   No current facility-administered medications for this visit.    Allergies:   Review of patient's allergies indicates no known allergies.    Social History:  The patient  reports that he quit smoking about 41 years ago. His smoking use included Cigarettes. He has a 44 pack-year smoking history. He has never used smokeless tobacco. He reports that he drinks alcohol. He reports that he does not use illicit drugs.   Family History:  The patient's family history includes Cancer in his sister; Heart disease in his father; Rheum arthritis in his father and mother; Stroke in his sister.    ROS:  Please see the history of present illness.    Review of Systems: Constitutional:  denies fever, chills, diaphoresis, appetite change and fatigue.  HEENT: denies photophobia, eye pain, redness, hearing loss, ear pain, congestion, sore throat, rhinorrhea, sneezing, neck pain, neck stiffness and tinnitus.  Respiratory: denies SOB, DOE, cough, chest tightness, and wheezing.  Cardiovascular: denies chest pain, palpitations and leg swelling.  Gastrointestinal: denies nausea, vomiting, abdominal pain, diarrhea, constipation, blood in stool.  Genitourinary: denies dysuria, urgency, frequency, hematuria, flank pain and difficulty urinating.  Musculoskeletal:  denies  myalgias, back pain, joint swelling, arthralgias and gait problem.   Skin: denies pallor, rash and wound.  Neurological: denies dizziness, seizures, syncope, weakness, light-headedness, numbness and headaches.   Hematological: denies adenopathy, easy bruising, personal or family bleeding history.  Psychiatric/ Behavioral: denies suicidal ideation, mood changes, confusion, nervousness, sleep disturbance and agitation.       All other systems are reviewed and negative.    PHYSICAL EXAM: VS:  BP 154/94 mmHg  Pulse 110  Ht 6' (1.829 m)  Wt 113.762 kg (250 lb 12.8 oz)  BMI 34.01 kg/m2 , BMI Body mass index is 34.01 kg/(m^2). GEN: Well nourished, well developed, in no acute distress HEENT: normal Neck: no JVD, carotid bruits, or masses Cardiac: Irreg. Irreg. ; no murmurs, rubs, or gallops,no edema  Respiratory:  clear to auscultation bilaterally, normal work of breathing GI: soft, nontender, nondistended, + BS MS: no deformity or atrophy Skin: warm and dry, no rash Neuro:  Strength and sensation are intact Psych: normal   EKG:  EKG is not ordered today. The ekg ordered Feb. 17, 2016  demonstrates atrial fib with controlled rate.    Recent Labs: 06/20/2014: Pro B Natriuretic peptide (BNP) 930.1* 10/15/2014: B Natriuretic Peptide 164.8* 02/12/2015: Magnesium 2.2 03/20/2015: Hemoglobin 11.4*; Platelets 252.0 04/08/2015: ALT 17; BUN 24; Creatinine, Ser 1.79*; Potassium 4.9; Sodium 141; TSH 86.870*    Lipid Panel No results found for: CHOL, TRIG, HDL, CHOLHDL, VLDL, LDLCALC, LDLDIRECT    Wt Readings from Last 3 Encounters:  06/05/15 113.762 kg (250 lb 12.8 oz)  05/13/15 114.306 kg (252 lb)  05/08/15 114.361 kg (252 lb 1.9 oz)      Other studies Reviewed: Additional  studies/ records that were reviewed today include: . Review of the above records demonstrates:    ASSESSMENT AND PLAN:  1. Aortic insufficiency :  S/p AVR     2. Hyperlipidemia  3. HTN - BP has been  elevated.   He eats a very salty diet .  Eats [employer] cuisines or at [employer] on a daily basis. Occasionally eats ham and cheese for lunch .  We had a long discussion about this. I'm has intent to add a diabetic since his creatinine is artery moderately elevated. He has stage III chronic kidney disease.  He has frequent episodes of orthostatic hypotension-in fact he had an episode of orthostatic hypotension while he was checking out today. This all seems to be stable. We've instructed him to hold onto handrail, chair, or table when he stands up.  We will discontinue his Toprol-XL and start him on carvedilol 25 mg twice a day. We will have him return for a nurse visit in one month. I'll see him again in 3 months. We'll check a basic metabolic profile today.   4. Obstructive sleep apnea  5. AAA - 2007 , Cari Caraway  6. Hypothyroidism  7. COPD   8. Atrial fib:  -  He is in chronic atrial fibrillation. He's currently on Eliquis.   Will start Coreg instead of metoprolol   9. CAD :  Has disease of the RCA.  Was not bypassed due to inability of harvest adequate SVG for grafting .   Current medicines are reviewed at length with the patient today.  The patient does not have concerns regarding medicines.  The following changes have been made:  no change  Labs/ tests ordered today include:  No orders of the defined types were placed in this encounter.     Disposition:   FU with me in 3 months . Will have a nurse visit in 1 month.      Signed, Mathieu Schloemer, Deloris Ping, MD  06/05/2015 12:06 PM    [facility] [address] [place], [place], [place]  [address] Phone: [phone]; Fax: [phone]

## 2015-06-05 NOTE — Patient Instructions (Addendum)
Medication Instructions:  STOP Toprol XL (Metoprolol Succinate)  START Carvedilol (Coreg) 25 mg twice daily - take 12 hours apart   Labwork: TODAY - basic metabolic panel   Testing/Procedures: None Ordered   Follow-Up: Your physician recommends that you schedule a follow-up appointment in: 1 month for Nurse visit/BP check on a day Dr. Elease Hashimoto is in the office  Your physician wants you to follow-up in: 3 months with Dr. Elease Hashimoto.  You will receive a reminder letter in the mail two months in advance. If you don't receive a letter, please call our office to schedule the follow-up appointment.

## 2015-06-06 ENCOUNTER — Encounter (HOSPITAL_COMMUNITY): Payer: Medicare Other

## 2015-06-06 ENCOUNTER — Other Ambulatory Visit: Payer: Self-pay | Admitting: Pharmacist

## 2015-06-06 DIAGNOSIS — I4891 Unspecified atrial fibrillation: Secondary | ICD-10-CM | POA: Diagnosis not present

## 2015-06-06 DIAGNOSIS — R531 Weakness: Secondary | ICD-10-CM | POA: Diagnosis not present

## 2015-06-06 DIAGNOSIS — Z952 Presence of prosthetic heart valve: Secondary | ICD-10-CM | POA: Diagnosis not present

## 2015-06-06 DIAGNOSIS — I129 Hypertensive chronic kidney disease with stage 1 through stage 4 chronic kidney disease, or unspecified chronic kidney disease: Secondary | ICD-10-CM | POA: Diagnosis not present

## 2015-06-06 DIAGNOSIS — R269 Unspecified abnormalities of gait and mobility: Secondary | ICD-10-CM | POA: Diagnosis not present

## 2015-06-06 DIAGNOSIS — J449 Chronic obstructive pulmonary disease, unspecified: Secondary | ICD-10-CM | POA: Diagnosis not present

## 2015-06-06 NOTE — Patient Outreach (Signed)
[facility]) Care Management  [facility]   06/06/2015  [name] [name] 03-07-1932 [id number]  Subjective: [name] R Holberg is a 81 y.o. male who was referred to [facility] for medication management. Per report, patient needs assistance in medication adherence as he has been forgetful in taking his medications and needs pill box filling assistance.    [facility].  His son has helped fill his pill box in the past and has continued to do so. Therefore he denies needing assistance for filling his pill box.  Patient reports that he continues to forget to take his medications despite using an alarm and the pill box. This is because he is not always at home to take his medications.    Objective:   Current Medications: Current Outpatient Prescriptions  Medication Sig Dispense Refill  . albuterol (PROVENTIL HFA;VENTOLIN HFA) 108 (90 BASE) MCG/ACT inhaler Inhale 2 puffs into the lungs every 6 (six) hours as needed for wheezing or shortness of breath.    Marland Kitchen amLODipine (NORVASC) 2.5 MG tablet Take 2 tablets (5 mg total) by mouth daily. 30 tablet 11  . apixaban (ELIQUIS) 5 MG TABS tablet Take 5 mg by mouth 2 (two) times daily.    Marland Kitchen atorvastatin (LIPITOR) 10 MG tablet Take 1 tablet (10 mg total) by mouth daily. 30 tablet 0  . buPROPion (WELLBUTRIN XL) 300 MG 24 hr tablet Take 1 tablet (300 mg total) by mouth daily. 30 tablet 1  . busPIRone (BUSPAR) 15 MG tablet Take 1 tablet (15 mg total) by mouth 2 (two) times daily. 60 tablet 1  . calcium carbonate (TUMS - DOSED IN MG ELEMENTAL CALCIUM) 500 MG chewable tablet Chew 1 tablet by mouth as needed for indigestion or heartburn.    . carvedilol (COREG) 25 MG tablet Take 1 tablet (25 mg total) by mouth 2 (two) times daily. 60 tablet 11  . clonazePAM (KLONOPIN) 0.5 MG tablet Take 1 tablet (0.5 mg total) by mouth at bedtime. 30 tablet 0  . digoxin (LANOXIN) 0.125 MG tablet Take 125 mcg by mouth daily.  0  .  dutasteride (AVODART) 0.5 MG capsule Take 1 capsule (0.5 mg total) by mouth daily. 30 capsule 1  . escitalopram (LEXAPRO) 20 MG tablet Take 1 tablet (20 mg total) by mouth daily. 30 tablet 1  . Eszopiclone 3 MG TABS Take 3 mg by mouth at bedtime.  0  . fluticasone (FLONASE) 50 MCG/ACT nasal spray Place 1 spray into both nostrils daily. 9.9 g 2  . folic acid (FOLVITE) 1 MG tablet Take 1 tablet (1 mg total) by mouth daily. 30 tablet 1  . lamoTRIgine (LAMICTAL) 100 MG tablet Take 1 tablet (100 mg total) by mouth 2 (two) times daily. Breakfast and dinner 60 tablet 1  . levothyroxine (SYNTHROID, LEVOTHROID) 150 MCG tablet Take 1 tablet (150 mcg total) by mouth daily before breakfast. 30 tablet 1  . Melatonin 3 MG TABS Take 1 tablet by mouth at bedtime.    . traMADol (ULTRAM) 50 MG tablet Take 1 tablet (50 mg total) by mouth every 4 (four) hours as needed for moderate pain. 90 tablet 0   No current facility-administered medications for this visit.    Functional Status: In your present state of health, do you have any difficulty performing the following activities: 05/13/2015 05/13/2015  Hearing? - Y  Vision? - Y  Difficulty concentrating or making decisions? - Y  Walking or climbing stairs? - Y  Dressing or  bathing? - N  Doing errands, shopping? - N  Quarry manager and eating ? N Y  Using the Toilet? N Y  In the past six months, have you accidently leaked urine? N N  Do you have problems with loss of bowel control? - N  Managing your Medications? (No Data) Y  Managing your Finances? - N  Housekeeping or managing your Housekeeping? - N    Fall/Depression Screening: PHQ 2/9 Scores 05/13/2015 04/30/2015  PHQ - 2 Score 2 3  PHQ- 9 Score 9 19    Assessment: 1. Medication adherence: patient has difficulty remembering to take his medications when he is not at home.   Plan: 1. Medication adherence: home visit scheduled for Monday, 06/09/15 at 11 AM to further assist patient with adherence.  Patient verbalized understanding.    [name], PharmD, BCPS Clinical Pharmacist [facility] [phone]

## 2015-06-09 ENCOUNTER — Encounter (HOSPITAL_COMMUNITY): Payer: Medicare Other

## 2015-06-09 ENCOUNTER — Other Ambulatory Visit: Payer: Self-pay | Admitting: Pharmacist

## 2015-06-09 DIAGNOSIS — R531 Weakness: Secondary | ICD-10-CM | POA: Diagnosis not present

## 2015-06-09 DIAGNOSIS — R269 Unspecified abnormalities of gait and mobility: Secondary | ICD-10-CM | POA: Diagnosis not present

## 2015-06-09 DIAGNOSIS — I129 Hypertensive chronic kidney disease with stage 1 through stage 4 chronic kidney disease, or unspecified chronic kidney disease: Secondary | ICD-10-CM | POA: Diagnosis not present

## 2015-06-09 DIAGNOSIS — Z952 Presence of prosthetic heart valve: Secondary | ICD-10-CM | POA: Diagnosis not present

## 2015-06-09 DIAGNOSIS — I4891 Unspecified atrial fibrillation: Secondary | ICD-10-CM | POA: Diagnosis not present

## 2015-06-09 DIAGNOSIS — J449 Chronic obstructive pulmonary disease, unspecified: Secondary | ICD-10-CM | POA: Diagnosis not present

## 2015-06-09 NOTE — Patient Outreach (Signed)
[facility]) Care Management  [facility]   06/09/2015  [name] TRICIA OAXACA 1931-09-09 [id number]  Subjective: [name] [name] is a 81 y.o. male who was referred to [facility] for medication adherence.  I arrived to the patient's home. He had all of his current medications in the kitchen. He uses a weekly pill box with morning, noon, evening, and bedtime compartments. He reports that his son fills his pill box for him.  He reports that he will miss medications when he leaves the home. He will also miss his afternoon medications. He has a clock with an alarm at the kitchen table that goes off at 1pm to remind him to take his afternoon medications. He denies any issues remembering to take his morning and bedtime medications.  His son created a spreadsheet for him with what his medications are and what they are for and when to take them. He reports that it is very helpful for him. He also has a sticky note on his refrigerator to remind him to take his levothyroxine in the morning prior to eating.   He has not picked up the carvedilol yet and still have the metoprolol succinate.   Objective:   Current Medications: Current Outpatient Prescriptions  Medication Sig Dispense Refill  . albuterol (PROVENTIL HFA;VENTOLIN HFA) 108 (90 BASE) MCG/ACT inhaler Inhale 2 puffs into the lungs every 6 (six) hours as needed for wheezing or shortness of breath.    Marland Kitchen amLODipine (NORVASC) 2.5 MG tablet Take 2 tablets (5 mg total) by mouth daily. 30 tablet 11  . apixaban (ELIQUIS) 5 MG TABS tablet Take 2.5 mg by mouth 2 (two) times daily.     Marland Kitchen atorvastatin (LIPITOR) 10 MG tablet Take 1 tablet (10 mg total) by mouth daily. 30 tablet 0  . buPROPion (WELLBUTRIN XL) 300 MG 24 hr tablet Take 1 tablet (300 mg total) by mouth daily. 30 tablet 1  . busPIRone (BUSPAR) 15 MG tablet Take 1 tablet (15 mg total) by mouth 2 (two) times daily. 60 tablet 1  . clonazePAM (KLONOPIN) 0.5 MG tablet Take 1  tablet (0.5 mg total) by mouth at bedtime. 30 tablet 0  . digoxin (LANOXIN) 0.125 MG tablet Take 125 mcg by mouth daily.  0  . dutasteride (AVODART) 0.5 MG capsule Take 1 capsule (0.5 mg total) by mouth daily. 30 capsule 1  . escitalopram (LEXAPRO) 20 MG tablet Take 1 tablet (20 mg total) by mouth daily. 30 tablet 1  . Eszopiclone 3 MG TABS Take 3 mg by mouth at bedtime.  0  . fluticasone (FLONASE) 50 MCG/ACT nasal spray Place 1 spray into both nostrils daily. 9.9 g 2  . folic acid (FOLVITE) 1 MG tablet Take 1 tablet (1 mg total) by mouth daily. 30 tablet 1  . levothyroxine (SYNTHROID, LEVOTHROID) 150 MCG tablet Take 1 tablet (150 mcg total) by mouth daily before breakfast. 30 tablet 1  . metoprolol succinate (TOPROL-XL) 50 MG 24 hr tablet Take 50 mg by mouth daily. Take with or immediately following a meal.    . traMADol (ULTRAM) 50 MG tablet Take 1 tablet (50 mg total) by mouth every 4 (four) hours as needed for moderate pain. 90 tablet 0  . calcium carbonate (TUMS - DOSED IN MG ELEMENTAL CALCIUM) 500 MG chewable tablet Chew 1 tablet by mouth as needed for indigestion or heartburn.    . carvedilol (COREG) 25 MG tablet Take 1 tablet (25 mg total) by mouth 2 (two)  times daily. (Patient not taking: Reported on 06/09/2015) 60 tablet 11  . lamoTRIgine (LAMICTAL) 100 MG tablet Take 1 tablet (100 mg total) by mouth 2 (two) times daily. Breakfast and dinner 60 tablet 1  . Melatonin 3 MG TABS Take 1 tablet by mouth at bedtime.     No current facility-administered medications for this visit.    Functional Status: In your present state of health, do you have any difficulty performing the following activities: 05/13/2015 05/13/2015  Hearing? - Y  Vision? - Y  Difficulty concentrating or making decisions? - Y  Walking or climbing stairs? - Y  Dressing or bathing? - N  Doing errands, shopping? - N  Quarry manager and eating ? N Y  Using the Toilet? N Y  In the past six months, have you accidently  leaked urine? N N  Do you have problems with loss of bowel control? - N  Managing your Medications? (No Data) Y  Managing your Finances? - N  Housekeeping or managing your Housekeeping? - N    Fall/Depression Screening: PHQ 2/9 Scores 05/13/2015 04/30/2015  PHQ - 2 Score 2 3  PHQ- 9 Score 9 19    Assessment: 1. Medication adherence: patient is adherent to medications except for the afternoon medications because he is often busy at those times and it is hard for him to remember to take them. Has a good support system with his wife and son and his son appears to do a good job of managing his medications.   Plan: 1. Medication adherence: Medication reconciliation completed. I switched the schedule of all of his medications to be taken either in the morning or evening, so he doesn't have to take any in the afternoon. None of his medications were prescribed to be taken in the afternoon, but his pill box had the majority of his medications in the afternoon slot. This is more appropriate for his BID medications anyway as they should be about 12 hours apart. Filled the patient's pill box for the rest of the week. Counseled on medication adherence. Instructed patient to tell his son to put the medications either in the morning or evening pill box slots. Patient verbalized understanding. Will call patient on 06/20/15 to see if his adherence has improved by switching the schedule of his medications.    [name], PharmD, BCPS Clinical Pharmacist [facility] [phone]

## 2015-06-10 ENCOUNTER — Ambulatory Visit: Payer: Self-pay | Admitting: Cardiovascular Disease

## 2015-06-11 ENCOUNTER — Encounter (HOSPITAL_COMMUNITY): Payer: Medicare Other

## 2015-06-11 DIAGNOSIS — R269 Unspecified abnormalities of gait and mobility: Secondary | ICD-10-CM | POA: Diagnosis not present

## 2015-06-11 DIAGNOSIS — I129 Hypertensive chronic kidney disease with stage 1 through stage 4 chronic kidney disease, or unspecified chronic kidney disease: Secondary | ICD-10-CM | POA: Diagnosis not present

## 2015-06-11 DIAGNOSIS — Z952 Presence of prosthetic heart valve: Secondary | ICD-10-CM | POA: Diagnosis not present

## 2015-06-11 DIAGNOSIS — I4891 Unspecified atrial fibrillation: Secondary | ICD-10-CM | POA: Diagnosis not present

## 2015-06-11 DIAGNOSIS — R531 Weakness: Secondary | ICD-10-CM | POA: Diagnosis not present

## 2015-06-11 DIAGNOSIS — J449 Chronic obstructive pulmonary disease, unspecified: Secondary | ICD-10-CM | POA: Diagnosis not present

## 2015-06-12 ENCOUNTER — Other Ambulatory Visit: Payer: Self-pay

## 2015-06-12 ENCOUNTER — Encounter (HOSPITAL_COMMUNITY): Payer: Self-pay | Admitting: Emergency Medicine

## 2015-06-12 ENCOUNTER — Telehealth: Payer: Self-pay | Admitting: Cardiovascular Disease

## 2015-06-12 ENCOUNTER — Ambulatory Visit: Payer: Self-pay

## 2015-06-12 ENCOUNTER — Emergency Department (HOSPITAL_COMMUNITY)
Admission: EM | Admit: 2015-06-12 | Discharge: 2015-06-12 | Disposition: A | Discharge status: Home / Self Care | Payer: Medicare Other | Attending: Emergency Medicine | Admitting: Emergency Medicine

## 2015-06-12 DIAGNOSIS — Z87891 Personal history of nicotine dependence: Secondary | ICD-10-CM | POA: Diagnosis not present

## 2015-06-12 DIAGNOSIS — F329 Major depressive disorder, single episode, unspecified: Secondary | ICD-10-CM | POA: Insufficient documentation

## 2015-06-12 DIAGNOSIS — Z7902 Long term (current) use of antithrombotics/antiplatelets: Secondary | ICD-10-CM | POA: Diagnosis not present

## 2015-06-12 DIAGNOSIS — Z8739 Personal history of other diseases of the musculoskeletal system and connective tissue: Secondary | ICD-10-CM | POA: Diagnosis not present

## 2015-06-12 DIAGNOSIS — Z7951 Long term (current) use of inhaled steroids: Secondary | ICD-10-CM | POA: Insufficient documentation

## 2015-06-12 DIAGNOSIS — N189 Chronic kidney disease, unspecified: Secondary | ICD-10-CM | POA: Insufficient documentation

## 2015-06-12 DIAGNOSIS — Z86718 Personal history of other venous thrombosis and embolism: Secondary | ICD-10-CM | POA: Insufficient documentation

## 2015-06-12 DIAGNOSIS — Z8719 Personal history of other diseases of the digestive system: Secondary | ICD-10-CM | POA: Diagnosis not present

## 2015-06-12 DIAGNOSIS — R51 Headache: Secondary | ICD-10-CM | POA: Diagnosis not present

## 2015-06-12 DIAGNOSIS — I129 Hypertensive chronic kidney disease with stage 1 through stage 4 chronic kidney disease, or unspecified chronic kidney disease: Secondary | ICD-10-CM | POA: Insufficient documentation

## 2015-06-12 DIAGNOSIS — N400 Enlarged prostate without lower urinary tract symptoms: Secondary | ICD-10-CM | POA: Insufficient documentation

## 2015-06-12 DIAGNOSIS — I4891 Unspecified atrial fibrillation: Secondary | ICD-10-CM | POA: Diagnosis not present

## 2015-06-12 DIAGNOSIS — Z8669 Personal history of other diseases of the nervous system and sense organs: Secondary | ICD-10-CM | POA: Insufficient documentation

## 2015-06-12 DIAGNOSIS — E7800 Pure hypercholesterolemia, unspecified: Secondary | ICD-10-CM | POA: Insufficient documentation

## 2015-06-12 DIAGNOSIS — J449 Chronic obstructive pulmonary disease, unspecified: Secondary | ICD-10-CM | POA: Insufficient documentation

## 2015-06-12 DIAGNOSIS — F419 Anxiety disorder, unspecified: Secondary | ICD-10-CM | POA: Diagnosis not present

## 2015-06-12 DIAGNOSIS — Z79899 Other long term (current) drug therapy: Secondary | ICD-10-CM | POA: Diagnosis not present

## 2015-06-12 DIAGNOSIS — E039 Hypothyroidism, unspecified: Secondary | ICD-10-CM | POA: Insufficient documentation

## 2015-06-12 DIAGNOSIS — I10 Essential (primary) hypertension: Secondary | ICD-10-CM | POA: Diagnosis not present

## 2015-06-12 DIAGNOSIS — R011 Cardiac murmur, unspecified: Secondary | ICD-10-CM | POA: Insufficient documentation

## 2015-06-12 DIAGNOSIS — I2510 Atherosclerotic heart disease of native coronary artery without angina pectoris: Secondary | ICD-10-CM | POA: Insufficient documentation

## 2015-06-12 NOTE — Patient Outreach (Addendum)
[facility]  06/12/2015  [name] 23-Apr-1932 [id number]   Telephone call for scheduled assessment.  [name] stated he didn't feel good- he had a 'bad headache', and that his stomach hurts.  He had not checked his BP today.  RN Health Coach waited while he checked his BP- reading was 146/104.  Patient rechecked BP after 5 minutes and reported reading of 178/103.  Routine assessment not completed due to elevated BP.  Instructed patient to call Dr. [name], his cardiologist, and report both BP readings.  RN Health Coach will contact MD to report elevated BP, and will follow up with patient within 24 hours.  [name] Deisonnie Nell Schrack, RN, MSN RN [employer] InternationalHealth Coach [facility] [phone] Fax [phone]

## 2015-06-12 NOTE — Telephone Encounter (Signed)
Pt states BP this morning was 146/104.  BP at about 1:40pm was 184/124. HR has been running between 70-80. Pt complains of headache ("not bad but contstant"), "woozy" when changing positions, and SOB on exertion. Pt denies CP. At last OV pt was to stop Toprol and start Coreg 25mg  BID. Pt states that he stopped his Toprol 10/11 and started the Coreg 10/12. Pt states BP was a little better yesterday after starting Coreg but it was still high. Pt admits to eating high sodium lunch meats and drinking soda and green tea.  Spoke with pt about importance of a low sodium diet. Spoke with Nada BoozerLaura Ingold, NP and she said to have pt take Amlodipine 5mg  now and if BP not down in 2-3 hours have pt proceed to ED. Spoke with pt and advise him of instructions per Nada BoozerLaura Ingold. Pt verbalized understanding and was in agreement with the plan.

## 2015-06-12 NOTE — Telephone Encounter (Signed)
Follow up    Pt c/o BP issue: STAT if pt c/o blurred vision, one-sided weakness or slurred speech  1. What are your last 5 BP readings? 178/103 , 146/104   2. Are you having any other symptoms (ex. Dizziness, headache, blurred vision, passed out)? Severe headache, and stomach ache   3. What is your BP issue? Please advise.

## 2015-06-12 NOTE — ED Notes (Signed)
Yao, MD at bedside.  

## 2015-06-12 NOTE — ED Provider Notes (Signed)
CSN: [id number]     Arrival date & time 06/12/15  1824 History   First MD Initiated Contact with Patient 06/12/15 2056     Chief Complaint  Patient presents with  . Hypertension     (Consider location/radiation/quality/duration/timing/severity/associated sxs/prior Treatment) The history is provided by the patient.  Chrysler R Hagger is a 81 y.o. male history A. fib on eliquis, hypertension here presenting with headache, elevated blood pressure. She has intermittent headaches for a week. Patient states that he has headaches when his blood pressure was elevated. His primary care doctor has been adjusting his blood pressure medications in order to get him to cardiac rehabilitation. He states that his blood pressure was elevated 160/110 today. Has some minor headaches but denies any vomiting or chest pain or shortness of breath or abdominal pain. Patient was told to take an extra dose of blood pressure medicines. According to the telephone note, patient was supposed to take an extra dose of Norvasc but took coreg instead. After 2 hrs, bp improved to 130/80 but came back up to 160s. Came here for evaluation.    Past Medical History  Diagnosis Date  . Atrial fibrillation (HCC)   . Hypothyroidism   . Plantar fasciitis   . Seasonal allergies   . Decreased testosterone level   . Varicose veins   . Hypertension   . Hypercholesteremia   . Aortic valve insufficiency   . Depression   . Heart murmur   . DVT (deep venous thrombosis) (HCC) ~ 2013    "BLE"  . OSA (obstructive sleep apnea)     "suppose to wear mask; I throw it off in my sleep" (10/15/2014)  . COPD (chronic obstructive pulmonary disease) (HCC)   . Emphysema of lung (HCC)   . History of blood transfusion 1960    "lots; related to an accident"  . Arthritis     "wee bit; knees, elbows" (10/15/2014)  . Anxiety   . Falls frequently     > 20 times in the last year/notes 10/15/2014  . Syncope and collapse "several times"  . Complication  of anesthesia     unsure of complications but pt. states there were complications  . Dysrhythmia     A. Fib  . Coronary artery disease   . Shortness of breath dyspnea   . Chronic kidney disease     "down to ~ 1/2 of their regular use; I see kidney dr. in [place]" (10/15/2014)  . Urine frequency   . Urine incontinence   . Enlarged prostate   . GERD (gastroesophageal reflux disease)   . Difficult intubation     difficult intubation 12/03/09 and 03/27/10 ([facility]); glidescope used 03/27/10  . History of echocardiogram     post AVR >> Echo 7/16:  Mild LVH, EF 20-25%, AVR ok (peak 15 mmHg, mean 9 mmHg), MAC, mild MR, severe LAE, mild reduced RVSF, mild RAE, PASP 35 mmHg  . Cardiomyopathy [facility])    Past Surgical History  Procedure Laterality Date  . Abdominal aortic aneurysm repair  01/2006; 03/10/2006    Hattie Perch 01/12/2011  . Tonsillectomy    . Hernia repair    . Laparoscopic incisional / umbilical / ventral hernia repair  02/2010    VHR w/mesh/notes 03/28/2010  . Laparoscopic cholecystectomy  08/2009    w/IOC/notes 09/18/2009  . Ercp  11/2009    Hattie Perch 12/05/2009  . Gall stone    . Cardiac catheterization  1990's X 1; 09/2014    no stents  .  Nose surgery    . Brain surgery  1960 X 4    "S/P got my skull busted"; pt. states removed internal carotid artery  . Aortic valve replacement N/A 02/11/2015    Procedure: AORTIC VALVE REPLACEMENT (AVR);  Surgeon: Delight OvensEdward B Gerhardt, MD;  [facility];  Service: Open Heart Surgery;  Laterality: N/A;  . Clipping of atrial appendage N/A 02/11/2015    Procedure: CLIPPING OF ATRIAL APPENDAGE;  Surgeon: Delight OvensEdward B Gerhardt, MD;  [facility];  Service: Open Heart Surgery;  Laterality: N/A;  . Tee without cardioversion N/A 02/11/2015    Procedure: TRANSESOPHAGEAL ECHOCARDIOGRAM (TEE);  Surgeon: Delight OvensEdward B Gerhardt, MD;  [facility];  Service: Open Heart Surgery;  Laterality: N/A;   Family History  Problem Relation Age of Onset  . Heart disease Father   .  Rheum arthritis Mother   . Rheum arthritis Father   . Cancer Sister   . Stroke Sister    Social History  Substance Use Topics  . Smoking status: Former Smoker -- 2.00 packs/day for 22 years    Types: Cigarettes    Quit date: 04/29/1974  . Smokeless tobacco: Never Used  . Alcohol Use: 0.0 oz/week    0 Standard drinks or equivalent per week     Comment: 1 beer per year     Review of Systems  Neurological: Positive for headaches.  All other systems reviewed and are negative.     Allergies  Review of patient's allergies indicates no known allergies.  Home Medications   Prior to Admission medications   Medication Sig Start Date End Date Taking? Authorizing Provider  albuterol (PROVENTIL HFA;VENTOLIN HFA) 108 (90 BASE) MCG/ACT inhaler Inhale 2 puffs into the lungs every 6 (six) hours as needed for wheezing or shortness of breath.    Historical Provider, MD  amLODipine (NORVASC) 2.5 MG tablet Take 2 tablets (5 mg total) by mouth daily. 05/30/15   Vesta MixerPhilip J Nahser, MD  apixaban (ELIQUIS) 5 MG TABS tablet Take 2.5 mg by mouth 2 (two) times daily.     Historical Provider, MD  atorvastatin (LIPITOR) 10 MG tablet Take 1 tablet (10 mg total) by mouth daily. 02/28/15   Mcarthur Rossettianiel J Angiulli, PA-C  buPROPion (WELLBUTRIN XL) 300 MG 24 hr tablet Take 1 tablet (300 mg total) by mouth daily. 02/28/15   Mcarthur Rossettianiel J Angiulli, PA-C  busPIRone (BUSPAR) 15 MG tablet Take 1 tablet (15 mg total) by mouth 2 (two) times daily. 02/28/15   Mcarthur Rossettianiel J Angiulli, PA-C  calcium carbonate (TUMS - DOSED IN MG ELEMENTAL CALCIUM) 500 MG chewable tablet Chew 1 tablet by mouth as needed for indigestion or heartburn.    Historical Provider, MD  carvedilol (COREG) 25 MG tablet Take 1 tablet (25 mg total) by mouth 2 (two) times daily. Patient not taking: Reported on 06/09/2015 06/05/15   Vesta MixerPhilip J Nahser, MD  clonazePAM (KLONOPIN) 0.5 MG tablet Take 1 tablet (0.5 mg total) by mouth at bedtime. 02/28/15   Mcarthur Rossettianiel J Angiulli, PA-C  digoxin  (LANOXIN) 0.125 MG tablet Take 125 mcg by mouth daily. 01/03/15   Historical Provider, MD  dutasteride (AVODART) 0.5 MG capsule Take 1 capsule (0.5 mg total) by mouth daily. 02/28/15   Mcarthur Rossettianiel J Angiulli, PA-C  escitalopram (LEXAPRO) 20 MG tablet Take 1 tablet (20 mg total) by mouth daily. 02/28/15   Mcarthur Rossettianiel J Angiulli, PA-C  Eszopiclone 3 MG TABS Take 3 mg by mouth at bedtime. 01/18/15   Historical Provider, MD  fluticasone Aleda Grana(FLONASE)  50 MCG/ACT nasal spray Place 1 spray into both nostrils daily. 02/28/15   Mcarthur Rossetti Angiulli, PA-C  folic acid (FOLVITE) 1 MG tablet Take 1 tablet (1 mg total) by mouth daily. 02/28/15   Mcarthur Rossetti Angiulli, PA-C  lamoTRIgine (LAMICTAL) 100 MG tablet Take 1 tablet (100 mg total) by mouth 2 (two) times daily. Breakfast and dinner 02/28/15   Mcarthur Rossetti Angiulli, PA-C  levothyroxine (SYNTHROID, LEVOTHROID) 150 MCG tablet Take 1 tablet (150 mcg total) by mouth daily before breakfast. Patient taking differently: Take 175 mcg by mouth daily before breakfast.  02/28/15   Mcarthur Rossetti Angiulli, PA-C  Melatonin 3 MG TABS Take 1 tablet by mouth at bedtime.    Historical Provider, MD  metoprolol succinate (TOPROL-XL) 50 MG 24 hr tablet Take 50 mg by mouth daily. Take with or immediately following a meal.    Historical Provider, MD  traMADol (ULTRAM) 50 MG tablet Take 1 tablet (50 mg total) by mouth every 4 (four) hours as needed for moderate pain. 02/28/15   Mcarthur Rossetti Angiulli, PA-C   BP 139/83 mmHg  Pulse 59  Resp 18  Ht 6' (1.829 m)  Wt 257 lb 9.6 oz (116.847 kg)  BMI 34.93 kg/m2  SpO2 97% Physical Exam  Constitutional: He is oriented to person, place, and time. He appears well-developed and well-nourished.  HENT:  Head: Normocephalic.  Mouth/Throat: Oropharynx is clear and moist.  Eyes: Conjunctivae are normal. Pupils are equal, round, and reactive to light.  Neck: Normal range of motion. Neck supple.  Cardiovascular: Normal rate, regular rhythm and normal heart sounds.   Pulmonary/Chest:  Effort normal and breath sounds normal. No respiratory distress. He has no wheezes. He has no rales.  Abdominal: Soft. Bowel sounds are normal. He exhibits no distension. There is no tenderness. There is no rebound.  Musculoskeletal: Normal range of motion. He exhibits no edema or tenderness.  Neurological: He is alert and oriented to person, place, and time. No cranial nerve deficit. Coordination normal.  CN 2-12 intact. Nl strength and sensation throughout.   Skin: Skin is warm and dry.  Psychiatric: He has a normal mood and affect. His behavior is normal. Judgment and thought content normal.  Nursing note and vitals reviewed.   ED Course  Procedures (including critical care time) Labs Review Labs Reviewed - No data to display  Imaging Review No results found. I have personally reviewed and evaluated these images and lab results as part of my medical decision-making.   EKG Interpretation None      MDM   Final diagnoses:  None   Chrysler R Fabro is a 81 y.o. male here with hypertension, headaches. Headaches intermittent for a week, no head injury. Nl neuro exam. BP 130/84, checked twice in the ED. No chest pain or shortness of breath or any other signs of hypertensive emergency. I think he is stable to follow up outpatient.     Richardean Canal, MD 06/12/15 2113

## 2015-06-12 NOTE — Discharge Instructions (Signed)
Buy a new BP machine.  Call Dr. Harvie BridgeNahser's office tomorrow to ask about adjusting blood pressure medication.   See your doctor.   Return to ER if you have severe headaches, vomiting, abdominal pain, chest pain, BP greater than 220/110.

## 2015-06-12 NOTE — ED Notes (Signed)
Pt told to come to ER if bp continued to be elevated. Pt doubled his dose of Coreg today but bp still was elevated. Pt has no complaints.

## 2015-06-12 NOTE — Patient Outreach (Signed)
[facility]) Care Management  06/12/2015  Chrysler Darlin DropR Weidinger 08-13-1932 [id number]   Call back to patient concerning elevated BP reported earlier today.  Patient states he did call Dr. Harvie BridgeNahser's office, and was told to take Amlodipine 5mg  stat, recheck his BP after 2-3 hours, and go to ED if it was not down.  Requested patient take his BP, since it had been approximately 2 hours since he took the Amlodipine.  Current BP reading reported at 127/89, down from 178/103 and 146/104 earlier today.  Recommended patient rest, and retake BP again in one hour, and follow MD's advice.  Encouraged patient to call as needed.  He has the 24 hour nurse line number.  RN Health Coach will follow up on 06-16-15 to complete assessment.  Tyler Deisonnie Charie Pinkus, RN, MSN RN [employer] InternationalHealth Coach [facility] [phone] Fax [phone]

## 2015-06-12 NOTE — Telephone Encounter (Signed)
New Message      Pt calling stating that he was told to call our office and let us know that his BP is 184/124. Pt doesn't know who it was that told him to call. Please call back and advise.

## 2015-06-12 NOTE — Patient Outreach (Signed)
[facility]) Care Management  06/12/2015  Chrysler Darlin DropR Peary 1931/10/22 [id number]   Telephone call to MD office to report patient's elevated BP.  Was told that [name] had called, and that the triage nurse would follow up.  Tyler Deisonnie Deadrian Toya, RN, MSN RN [employer] InternationalHealth Coach [facility] [phone] Fax [phone]

## 2015-06-13 ENCOUNTER — Encounter (HOSPITAL_COMMUNITY): Payer: Medicare Other

## 2015-06-13 DIAGNOSIS — Z952 Presence of prosthetic heart valve: Secondary | ICD-10-CM | POA: Diagnosis not present

## 2015-06-13 DIAGNOSIS — I4891 Unspecified atrial fibrillation: Secondary | ICD-10-CM | POA: Diagnosis not present

## 2015-06-13 DIAGNOSIS — J449 Chronic obstructive pulmonary disease, unspecified: Secondary | ICD-10-CM | POA: Diagnosis not present

## 2015-06-13 DIAGNOSIS — R531 Weakness: Secondary | ICD-10-CM | POA: Diagnosis not present

## 2015-06-13 DIAGNOSIS — R269 Unspecified abnormalities of gait and mobility: Secondary | ICD-10-CM | POA: Diagnosis not present

## 2015-06-13 DIAGNOSIS — I129 Hypertensive chronic kidney disease with stage 1 through stage 4 chronic kidney disease, or unspecified chronic kidney disease: Secondary | ICD-10-CM | POA: Diagnosis not present

## 2015-06-16 ENCOUNTER — Encounter (HOSPITAL_COMMUNITY): Payer: Medicare Other

## 2015-06-16 ENCOUNTER — Ambulatory Visit: Payer: Self-pay

## 2015-06-16 ENCOUNTER — Other Ambulatory Visit: Payer: Self-pay

## 2015-06-16 DIAGNOSIS — I129 Hypertensive chronic kidney disease with stage 1 through stage 4 chronic kidney disease, or unspecified chronic kidney disease: Secondary | ICD-10-CM | POA: Diagnosis not present

## 2015-06-16 DIAGNOSIS — R531 Weakness: Secondary | ICD-10-CM | POA: Diagnosis not present

## 2015-06-16 DIAGNOSIS — R269 Unspecified abnormalities of gait and mobility: Secondary | ICD-10-CM | POA: Diagnosis not present

## 2015-06-16 DIAGNOSIS — J449 Chronic obstructive pulmonary disease, unspecified: Secondary | ICD-10-CM | POA: Diagnosis not present

## 2015-06-16 DIAGNOSIS — Z952 Presence of prosthetic heart valve: Secondary | ICD-10-CM | POA: Diagnosis not present

## 2015-06-16 DIAGNOSIS — I4891 Unspecified atrial fibrillation: Secondary | ICD-10-CM | POA: Diagnosis not present

## 2015-06-16 NOTE — Patient Outreach (Signed)
[facility]) Care Management  [place]  06/16/2015   [name] Apr 04, 1932 [id number]  Called today to follow-up on patient's episode last week with elevated BP.  He reports he did go to the ED the evening of 06-12-15 due to elevated BP.  He was not admitted, and he reports no changes in medications.  He was encouraged to check and record daily BPs, and to call his MD or the [facility] 24 hour nurse line to report elevated readings before going to ED.  Patient is currently receiving home PT instead of going to Cardiac Rehab.  States he can ambulate 300 feet without shortness of breath, but does admit to feeling lightheaded at times.  Fall prevention measures reinforced with patient, including the use of assistive DME.  He has a walker, but has misplaced his cane.  He thinks his PT will order a new cane for him.  Patient is trying to limit salt intake.  Education on reading labels and avoiding processed foods and salty snacks was reinforced.  Current Medications:  Current Outpatient Prescriptions  Medication Sig Dispense Refill  . albuterol (PROVENTIL HFA;VENTOLIN HFA) 108 (90 BASE) MCG/ACT inhaler Inhale 2 puffs into the lungs every 6 (six) hours as needed for wheezing or shortness of breath.    Marland Kitchen amLODipine (NORVASC) 2.5 MG tablet Take 2 tablets (5 mg total) by mouth daily. 30 tablet 11  . apixaban (ELIQUIS) 5 MG TABS tablet Take 2.5 mg by mouth 2 (two) times daily.     Marland Kitchen atorvastatin (LIPITOR) 10 MG tablet Take 1 tablet (10 mg total) by mouth daily. 30 tablet 0  . buPROPion (WELLBUTRIN XL) 300 MG 24 hr tablet Take 1 tablet (300 mg total) by mouth daily. 30 tablet 1  . busPIRone (BUSPAR) 15 MG tablet Take 1 tablet (15 mg total) by mouth 2 (two) times daily. 60 tablet 1  . calcium carbonate (TUMS - DOSED IN MG ELEMENTAL CALCIUM) 500 MG chewable tablet Chew 1 tablet by mouth as needed for indigestion or heartburn.    . carvedilol (COREG) 25 MG tablet Take 1 tablet (25  mg total) by mouth 2 (two) times daily. 60 tablet 11  . clonazePAM (KLONOPIN) 0.5 MG tablet Take 1 tablet (0.5 mg total) by mouth at bedtime. 30 tablet 0  . digoxin (LANOXIN) 0.125 MG tablet Take 125 mcg by mouth daily.  0  . dutasteride (AVODART) 0.5 MG capsule Take 1 capsule (0.5 mg total) by mouth daily. 30 capsule 1  . escitalopram (LEXAPRO) 20 MG tablet Take 1 tablet (20 mg total) by mouth daily. 30 tablet 1  . Eszopiclone 3 MG TABS Take 3 mg by mouth at bedtime.  0  . fluticasone (FLONASE) 50 MCG/ACT nasal spray Place 1 spray into both nostrils daily. 9.9 g 2  . folic acid (FOLVITE) 1 MG tablet Take 1 tablet (1 mg total) by mouth daily. 30 tablet 1  . lamoTRIgine (LAMICTAL) 100 MG tablet Take 1 tablet (100 mg total) by mouth 2 (two) times daily. Breakfast and dinner 60 tablet 1  . levothyroxine (SYNTHROID, LEVOTHROID) 150 MCG tablet Take 1 tablet (150 mcg total) by mouth daily before breakfast. (Patient taking differently: Take 175 mcg by mouth daily before breakfast. ) 30 tablet 1  . Melatonin 3 MG TABS Take 1 tablet by mouth at bedtime.    . traMADol (ULTRAM) 50 MG tablet Take 1 tablet (50 mg total) by mouth every 4 (four) hours as needed for moderate pain.  90 tablet 0  . metoprolol succinate (TOPROL-XL) 50 MG 24 hr tablet Take 50 mg by mouth daily. Take with or immediately following a meal.     No current facility-administered medications for this visit.    Functional Status:  In your present state of health, do you have any difficulty performing the following activities: 06/16/2015 05/13/2015  Hearing? [place]? Y -  Difficulty concentrating or making decisions? Y -  Walking or climbing stairs? Y -  Dressing or bathing? N -  Doing errands, shopping? N -  Conservation officer, nature and eating ? - N  Using the Toilet? - N  In the past six months, have you accidently leaked urine? - N  Do you have problems with loss of bowel control? - -  Managing your Medications? - (No Data)  Managing  your Finances? - -  Housekeeping or managing your Housekeeping? - -    Fall/Depression Screening: PHQ 2/9 Scores 05/13/2015 04/30/2015  PHQ - 2 Score 2 3  PHQ- 9 Score 9 19    [facility] CM Care Plan Problem One        Most Recent Value   Care Plan Problem One  Chronic disease management needs related to COPD.   Role Documenting the Problem One  [place] for Problem One  Active   [facility] Long Term Goal (31-90 days)  Patient stated goal is prevention of unnecessary hospital admissions for the next 60 days.   [facility] Long Term Goal Start Date  05/13/15   Interventions for Problem One Long Term Goal  Admitted for disease management services with monthly calls for education and support.   [facility] CM Short Term Goal #1 (0-30 days)  Patient will adher to medication regimen within 30 days.   [facility] CM Short Term Goal #1 Start Date  05/13/15   [facility] CM Short Term Goal #1 Met Date  06/16/15   Interventions for Short Term Goal #1  Educated on use of medication box.  Referral to pharmacy for consult/assistance with medications. [Reports use of med box and pharmacy consult have helped.]   [facility] CM Short Term Goal #2 (0-30 days)  Patient will not suffer a fall for the next 30 days.   [facility] CM Short Term Goal #2 Start Date  05/13/15   Interventions for Short Term Goal #2  Education provided on fall prevention and safety in the home.  Will Dealer.  Encouraged use of cane or walker as needed to prevent falls. [Fall prevention instruction provided.  Will mail EMMI.]   [facility] CM Short Term Goal #3 (0-30 days)  -- [Patient will check and record daily BP.]   [facility] CM Short Term Goal #3 Start Date  06/16/15   Interventions for Short Tern Goal #3  Education re reporting elevated BP to MD or to call 24 hour nurse line.     Assessment: Patient will benefit from ongoing support and education from [facility] to promote improved self-management of chronic diseases.  Plan: Patient will check weight and BP each  day and keep a record of readings.           Patient will decrease dietary salt intake.           Patient will practice fall prevention strategies in the home.           Patient will work on completion of advance directives.           RN  Health Coach will mail educational materials on low salt diet and fall prevention.           RN Health Coach will follow up in approximately one month.  Candie Mile, RN, MSN [place] [phone] Fax [phone]

## 2015-06-17 DIAGNOSIS — R269 Unspecified abnormalities of gait and mobility: Secondary | ICD-10-CM | POA: Diagnosis not present

## 2015-06-17 DIAGNOSIS — Z952 Presence of prosthetic heart valve: Secondary | ICD-10-CM | POA: Diagnosis not present

## 2015-06-17 DIAGNOSIS — R531 Weakness: Secondary | ICD-10-CM | POA: Diagnosis not present

## 2015-06-17 DIAGNOSIS — I129 Hypertensive chronic kidney disease with stage 1 through stage 4 chronic kidney disease, or unspecified chronic kidney disease: Secondary | ICD-10-CM | POA: Diagnosis not present

## 2015-06-17 DIAGNOSIS — I4891 Unspecified atrial fibrillation: Secondary | ICD-10-CM | POA: Diagnosis not present

## 2015-06-17 DIAGNOSIS — J449 Chronic obstructive pulmonary disease, unspecified: Secondary | ICD-10-CM | POA: Diagnosis not present

## 2015-06-18 ENCOUNTER — Encounter (HOSPITAL_COMMUNITY): Payer: Medicare Other

## 2015-06-19 ENCOUNTER — Other Ambulatory Visit: Payer: Self-pay | Admitting: Adult Health

## 2015-06-20 ENCOUNTER — Other Ambulatory Visit: Payer: Self-pay | Admitting: Pharmacist

## 2015-06-20 ENCOUNTER — Encounter (HOSPITAL_COMMUNITY): Payer: Medicare Other

## 2015-06-20 NOTE — Patient Outreach (Signed)
[facility]) Care Management  [facility]   06/20/2015  [name] [name] Apr 04, 1932 [id number]  Subjective: [name] [name] is a 81 y.o. male who was referred to [facility] for medication adherence.  I called patient to follow up. He reports that he is taking all of his medications and hasn't missed any doses since I saw him a few weeks ago.   He denies need anything further from me and denies needing any assistance or referrals to nursing or social work.   Objective:   Current Medications: Current Outpatient Prescriptions  Medication Sig Dispense Refill  . albuterol (PROVENTIL HFA;VENTOLIN HFA) 108 (90 BASE) MCG/ACT inhaler Inhale 2 puffs into the lungs every 6 (six) hours as needed for wheezing or shortness of breath.    Marland Kitchen amLODipine (NORVASC) 2.5 MG tablet Take 2 tablets (5 mg total) by mouth daily. 30 tablet 11  . apixaban (ELIQUIS) 5 MG TABS tablet Take 2.5 mg by mouth 2 (two) times daily.     Marland Kitchen atorvastatin (LIPITOR) 10 MG tablet Take 1 tablet (10 mg total) by mouth daily. 30 tablet 0  . buPROPion (WELLBUTRIN XL) 300 MG 24 hr tablet Take 1 tablet (300 mg total) by mouth daily. 30 tablet 1  . busPIRone (BUSPAR) 15 MG tablet Take 1 tablet (15 mg total) by mouth 2 (two) times daily. 60 tablet 1  . calcium carbonate (TUMS - DOSED IN MG ELEMENTAL CALCIUM) 500 MG chewable tablet Chew 1 tablet by mouth as needed for indigestion or heartburn.    . carvedilol (COREG) 25 MG tablet Take 1 tablet (25 mg total) by mouth 2 (two) times daily. 60 tablet 11  . clonazePAM (KLONOPIN) 0.5 MG tablet Take 1 tablet (0.5 mg total) by mouth at bedtime. 30 tablet 0  . digoxin (LANOXIN) 0.125 MG tablet Take 125 mcg by mouth daily.  0  . dutasteride (AVODART) 0.5 MG capsule Take 1 capsule (0.5 mg total) by mouth daily. 30 capsule 1  . escitalopram (LEXAPRO) 20 MG tablet Take 1 tablet (20 mg total) by mouth daily. 30 tablet 1  . Eszopiclone 3 MG TABS Take 3 mg by mouth at  bedtime.  0  . fluticasone (FLONASE) 50 MCG/ACT nasal spray Place 1 spray into both nostrils daily. 9.9 g 2  . folic acid (FOLVITE) 1 MG tablet Take 1 tablet (1 mg total) by mouth daily. 30 tablet 1  . lamoTRIgine (LAMICTAL) 100 MG tablet Take 1 tablet (100 mg total) by mouth 2 (two) times daily. Breakfast and dinner 60 tablet 1  . levothyroxine (SYNTHROID, LEVOTHROID) 150 MCG tablet Take 1 tablet (150 mcg total) by mouth daily before breakfast. (Patient taking differently: Take 175 mcg by mouth daily before breakfast. ) 30 tablet 1  . Melatonin 3 MG TABS Take 1 tablet by mouth at bedtime.    . metoprolol succinate (TOPROL-XL) 50 MG 24 hr tablet Take 50 mg by mouth daily. Take with or immediately following a meal.    . traMADol (ULTRAM) 50 MG tablet Take 1 tablet (50 mg total) by mouth every 4 (four) hours as needed for moderate pain. 90 tablet 0   No current facility-administered medications for this visit.    Functional Status: In your present state of health, do you have any difficulty performing the following activities: 06/20/2015 06/16/2015  Hearing? [name]  Vision? N Y  Difficulty concentrating or making decisions? [name]  Walking or climbing stairs? Y Y  Dressing or bathing? N  N  Doing errands, shopping? N N  Preparing Food and eating ? Y -  Using the Toilet? Y -  In the past six months, have you accidently leaked urine? Y -  Do you have problems with loss of bowel control? N -  Managing your Medications? Y -  Managing your Finances? N -  Housekeeping or managing your Housekeeping? N -    Fall/Depression Screening: PHQ 2/9 Scores 06/20/2015 05/13/2015 04/30/2015  PHQ - 2 Score 6 2 3   PHQ- 9 Score - 9 19    Assessment: 1. Medication adherence: patient reports adherence has improved since education.  Plan: 1. Medication adherence: patient denies any need for further assistance. I told him to please contact us if any issues arise. Patient verbalized understanding. Will close out  of pharmacy program. Of note, we still have not received his consent form back from him - will have another one mailed (verbal consent has been received).    Juanita CraverStacey Karl, PharmD, BCPS Clinical Pharmacist [facility] [phone]

## 2015-06-23 ENCOUNTER — Encounter (HOSPITAL_COMMUNITY): Payer: Medicare Other

## 2015-06-23 DIAGNOSIS — R531 Weakness: Secondary | ICD-10-CM | POA: Diagnosis not present

## 2015-06-23 DIAGNOSIS — R269 Unspecified abnormalities of gait and mobility: Secondary | ICD-10-CM | POA: Diagnosis not present

## 2015-06-23 DIAGNOSIS — Z952 Presence of prosthetic heart valve: Secondary | ICD-10-CM | POA: Diagnosis not present

## 2015-06-23 DIAGNOSIS — I129 Hypertensive chronic kidney disease with stage 1 through stage 4 chronic kidney disease, or unspecified chronic kidney disease: Secondary | ICD-10-CM | POA: Diagnosis not present

## 2015-06-23 DIAGNOSIS — I4891 Unspecified atrial fibrillation: Secondary | ICD-10-CM | POA: Diagnosis not present

## 2015-06-23 DIAGNOSIS — J449 Chronic obstructive pulmonary disease, unspecified: Secondary | ICD-10-CM | POA: Diagnosis not present

## 2015-06-24 DIAGNOSIS — J449 Chronic obstructive pulmonary disease, unspecified: Secondary | ICD-10-CM | POA: Diagnosis not present

## 2015-06-24 DIAGNOSIS — Z952 Presence of prosthetic heart valve: Secondary | ICD-10-CM | POA: Diagnosis not present

## 2015-06-24 DIAGNOSIS — R531 Weakness: Secondary | ICD-10-CM | POA: Diagnosis not present

## 2015-06-24 DIAGNOSIS — I129 Hypertensive chronic kidney disease with stage 1 through stage 4 chronic kidney disease, or unspecified chronic kidney disease: Secondary | ICD-10-CM | POA: Diagnosis not present

## 2015-06-24 DIAGNOSIS — R269 Unspecified abnormalities of gait and mobility: Secondary | ICD-10-CM | POA: Diagnosis not present

## 2015-06-24 DIAGNOSIS — I4891 Unspecified atrial fibrillation: Secondary | ICD-10-CM | POA: Diagnosis not present

## 2015-06-25 ENCOUNTER — Encounter (HOSPITAL_COMMUNITY): Payer: Medicare Other

## 2015-06-27 ENCOUNTER — Encounter (HOSPITAL_COMMUNITY): Payer: Medicare Other

## 2015-06-27 ENCOUNTER — Other Ambulatory Visit: Payer: Self-pay | Admitting: Adult Health

## 2015-06-30 ENCOUNTER — Encounter (HOSPITAL_COMMUNITY): Payer: Medicare Other

## 2015-06-30 DIAGNOSIS — R269 Unspecified abnormalities of gait and mobility: Secondary | ICD-10-CM | POA: Diagnosis not present

## 2015-06-30 DIAGNOSIS — I4891 Unspecified atrial fibrillation: Secondary | ICD-10-CM | POA: Diagnosis not present

## 2015-06-30 DIAGNOSIS — J449 Chronic obstructive pulmonary disease, unspecified: Secondary | ICD-10-CM | POA: Diagnosis not present

## 2015-06-30 DIAGNOSIS — R531 Weakness: Secondary | ICD-10-CM | POA: Diagnosis not present

## 2015-06-30 DIAGNOSIS — I129 Hypertensive chronic kidney disease with stage 1 through stage 4 chronic kidney disease, or unspecified chronic kidney disease: Secondary | ICD-10-CM | POA: Diagnosis not present

## 2015-06-30 DIAGNOSIS — Z952 Presence of prosthetic heart valve: Secondary | ICD-10-CM | POA: Diagnosis not present

## 2015-07-02 ENCOUNTER — Encounter (HOSPITAL_COMMUNITY): Payer: Medicare Other

## 2015-07-03 ENCOUNTER — Ambulatory Visit (INDEPENDENT_AMBULATORY_CARE_PROVIDER_SITE_OTHER): Payer: Medicare Other | Admitting: Cardiothoracic Surgery

## 2015-07-03 ENCOUNTER — Encounter: Payer: Self-pay | Admitting: Cardiothoracic Surgery

## 2015-07-03 VITALS — BP 120/60 | HR 63 | Resp 16 | Ht 72.0 in | Wt 250.0 lb

## 2015-07-03 DIAGNOSIS — I129 Hypertensive chronic kidney disease with stage 1 through stage 4 chronic kidney disease, or unspecified chronic kidney disease: Secondary | ICD-10-CM | POA: Diagnosis not present

## 2015-07-03 DIAGNOSIS — J449 Chronic obstructive pulmonary disease, unspecified: Secondary | ICD-10-CM | POA: Diagnosis not present

## 2015-07-03 DIAGNOSIS — Z952 Presence of prosthetic heart valve: Secondary | ICD-10-CM | POA: Diagnosis not present

## 2015-07-03 DIAGNOSIS — I4891 Unspecified atrial fibrillation: Secondary | ICD-10-CM | POA: Diagnosis not present

## 2015-07-03 DIAGNOSIS — R531 Weakness: Secondary | ICD-10-CM | POA: Diagnosis not present

## 2015-07-03 DIAGNOSIS — Z954 Presence of other heart-valve replacement: Secondary | ICD-10-CM

## 2015-07-03 DIAGNOSIS — I351 Nonrheumatic aortic (valve) insufficiency: Secondary | ICD-10-CM | POA: Diagnosis not present

## 2015-07-03 DIAGNOSIS — I25119 Atherosclerotic heart disease of native coronary artery with unspecified angina pectoris: Secondary | ICD-10-CM | POA: Diagnosis not present

## 2015-07-03 DIAGNOSIS — R269 Unspecified abnormalities of gait and mobility: Secondary | ICD-10-CM | POA: Diagnosis not present

## 2015-07-03 NOTE — Progress Notes (Signed)
[address]       [place] [address]             [phone]      [name] [name] [facility] Medical Record #[id number] Date of Birth: 08-Nov-1931  Referring: Nahser, Deloris Ping, MD Primary Care: Cain Saupe, MD  Chief Complaint:   POST OP FOLLOW UP 02/11/2015  OPERATIVE REPORT PREOPERATIVE DIAGNOSES: Severe aortic insufficiency and coronary artery disease of the right coronary artery. POSTOPERATIVE DIAGNOSES: Severe aortic insufficiency and coronary artery disease of the right coronary artery. SURGICAL PROCEDURE: Aortic valve replacement with pericardial tissue valve, [employer] model 3300TFX 27 mm, serial #[id number] and attempt harvest of saphenous vein. No bypass done. Placement of 35 mm AtriCure AtriClip. SURGEON: Sheliah Plane, MD  History of Present Illness:     Patient returns office in follow-up after his aortic valve replacement in June 2016. He comes in today somewhat confused about his medication the last recorded TSH is elevated to almost 90, the patient was unaware of his dose of Synthroid.      Past Medical History  Diagnosis Date  . Atrial fibrillation (HCC)   . Hypothyroidism   . Plantar fasciitis   . Seasonal allergies   . Decreased testosterone level   . Varicose veins   . Hypertension   . Hypercholesteremia   . Aortic valve insufficiency   . Depression   . Heart murmur   . DVT (deep venous thrombosis) (HCC) ~ 2013    "BLE"  . OSA (obstructive sleep apnea)     "suppose to wear mask; I throw it off in my sleep" (10/15/2014)  . COPD (chronic obstructive pulmonary disease) (HCC)   . Emphysema of lung (HCC)   . History of blood transfusion 1960    "lots; related to an accident"  . Arthritis     "wee bit; knees, elbows" (10/15/2014)  . Anxiety   . Falls frequently     > 20 times in the last year/notes 10/15/2014  . Syncope and collapse "several times"  . Complication of anesthesia     unsure of complications  but pt. states there were complications  . Dysrhythmia     A. Fib  . Coronary artery disease   . Shortness of breath dyspnea   . Chronic kidney disease     "down to ~ 1/2 of their regular use; I see kidney dr. in [place]" (10/15/2014)  . Urine frequency   . Urine incontinence   . Enlarged prostate   . GERD (gastroesophageal reflux disease)   . Difficult intubation     difficult intubation 12/03/09 and 03/27/10 ([facility]); glidescope used 03/27/10  . History of echocardiogram     post AVR >> Echo 7/16:  Mild LVH, EF 20-25%, AVR ok (peak 15 mmHg, mean 9 mmHg), MAC, mild MR, severe LAE, mild reduced RVSF, mild RAE, PASP 35 mmHg  . Cardiomyopathy (HCC)      History  Smoking status  . Former Smoker -- 2.00 packs/day for 22 years  . Types: Cigarettes  . Quit date: 04/29/1974  Smokeless tobacco  . Never Used    History  Alcohol Use  . 0.0 oz/week  . 0 Standard drinks or equivalent per week    Comment: 1 beer per year      No Known Allergies  Current Outpatient Prescriptions  Medication Sig Dispense Refill  . albuterol (PROVENTIL HFA;VENTOLIN HFA) 108 (90 BASE) MCG/ACT inhaler Inhale 2 puffs into the lungs  every 6 (six) hours as needed for wheezing or shortness of breath.    Marland Kitchen. amLODipine (NORVASC) 2.5 MG tablet Take 2 tablets (5 mg total) by mouth daily. 30 tablet 11  . apixaban (ELIQUIS) 5 MG TABS tablet Take 2.5 mg by mouth 2 (two) times daily.     Marland Kitchen. atorvastatin (LIPITOR) 10 MG tablet Take 1 tablet (10 mg total) by mouth daily. 30 tablet 0  . buPROPion (WELLBUTRIN XL) 300 MG 24 hr tablet Take 1 tablet (300 mg total) by mouth daily. 30 tablet 1  . busPIRone (BUSPAR) 15 MG tablet Take 1 tablet (15 mg total) by mouth 2 (two) times daily. 60 tablet 1  . calcium carbonate (TUMS - DOSED IN MG ELEMENTAL CALCIUM) 500 MG chewable tablet Chew 1 tablet by mouth as needed for indigestion or heartburn.    . carvedilol (COREG) 25 MG tablet Take 1 tablet (25 mg total) by mouth 2 (two) times  daily. 60 tablet 11  . clonazePAM (KLONOPIN) 0.5 MG tablet Take 1 tablet (0.5 mg total) by mouth at bedtime. 30 tablet 0  . digoxin (LANOXIN) 0.125 MG tablet Take 125 mcg by mouth daily.  0  . dutasteride (AVODART) 0.5 MG capsule Take 1 capsule (0.5 mg total) by mouth daily. 30 capsule 1  . escitalopram (LEXAPRO) 20 MG tablet Take 1 tablet (20 mg total) by mouth daily. 30 tablet 1  . Eszopiclone 3 MG TABS Take 3 mg by mouth at bedtime.  0  . fluticasone (FLONASE) 50 MCG/ACT nasal spray Place 1 spray into both nostrils daily. 9.9 g 2  . folic acid (FOLVITE) 1 MG tablet Take 1 tablet (1 mg total) by mouth daily. 30 tablet 1  . lamoTRIgine (LAMICTAL) 100 MG tablet Take 1 tablet (100 mg total) by mouth 2 (two) times daily. Breakfast and dinner 60 tablet 1  . levothyroxine (SYNTHROID, LEVOTHROID) 150 MCG tablet Take 1 tablet (150 mcg total) by mouth daily before breakfast. (Patient taking differently: Take 175 mcg by mouth daily before breakfast. ) 30 tablet 1  . Melatonin 3 MG TABS Take 1 tablet by mouth at bedtime.    . traMADol (ULTRAM) 50 MG tablet Take 1 tablet (50 mg total) by mouth every 4 (four) hours as needed for moderate pain. 90 tablet 0   No current facility-administered medications for this visit.       Physical Exam: BP 120/60 mmHg  Pulse 63  Resp 16  Ht 6' (1.829 m)  Wt 250 lb (113.399 kg)  BMI 33.90 kg/m2  SpO2 97%  General appearance: alert and cooperative Neurologic: intact Heart: regular rate and rhythm, S1, S2 normal, no murmur, click, rub or gallop Lungs: clear to auscultation bilaterally Abdomen: soft, non-tender; bowel sounds normal; no masses,  no organomegaly Extremities: extremities normal, atraumatic, no cyanosis or edema and Homans sign is negative, no sign of DVT Wound: Sternum stable well healed   Diagnostic Studies & Laboratory data:     Recent Radiology Findings:   No results found.    Recent Lab Findings: Lab Results  Component Value Date    WBC 6.3 04/08/2015   HGB 11.4* 03/20/2015   HCT 39.0 04/08/2015   PLT 252.0 03/20/2015   GLUCOSE 108* 06/05/2015   ALT 17 04/08/2015   AST 22 04/08/2015   NA 140 06/05/2015   K 4.1 06/05/2015   CL 103 06/05/2015   CREATININE 1.69* 06/05/2015   BUN 24 06/05/2015   CO2 24 06/05/2015   TSH 86.870* 04/08/2015  INR 1.50* 02/11/2015   HGBA1C 5.5 04/08/2015   Last echo:02/2015 Study Conclusions  - Left ventricle: The cavity size was normal. Wall thickness was increased in a pattern of mild LVH. Systolic function was severely reduced. The estimated ejection fraction was in the range of 20% to 25%. - Aortic valve: AV prosthesis opens well Peak and mean gradients through the valve are 15 and 9 mm Hg respectively. - Mitral valve: Calcified annulus. Mildly thickened leaflets . There was mild regurgitation. - Left atrium: The atrium was severely dilated. - Right ventricle: Systolic function was mildly reduced. - Right atrium: The atrium was mildly dilated. - Pulmonary arteries: PA peak pressure: 35 mm Hg (S).   Assessment / Plan:      Stable after his aortic valve replacement, primary care office to confirm what his dose of Synthroid should be.  The patient will check on his bottles when he returns home and call back Last recorded TSH level is 86.07 April 2015 He stable from a cardiac surgery standpoint and we have not made a return appointment but would be glad to see him should the need arise. He is aware of endocarditis precautions with his prosthetic valve.  [name] MD      [address] [place] [place] [address] Office [phone]   Beeper [phone]  07/03/2015 2:59 PM

## 2015-07-04 ENCOUNTER — Encounter (HOSPITAL_COMMUNITY): Payer: Medicare Other

## 2015-07-04 ENCOUNTER — Encounter: Payer: Self-pay | Admitting: *Deleted

## 2015-07-04 DIAGNOSIS — Z952 Presence of prosthetic heart valve: Secondary | ICD-10-CM | POA: Diagnosis not present

## 2015-07-04 DIAGNOSIS — J449 Chronic obstructive pulmonary disease, unspecified: Secondary | ICD-10-CM | POA: Diagnosis not present

## 2015-07-04 DIAGNOSIS — I4891 Unspecified atrial fibrillation: Secondary | ICD-10-CM | POA: Diagnosis not present

## 2015-07-04 DIAGNOSIS — R531 Weakness: Secondary | ICD-10-CM | POA: Diagnosis not present

## 2015-07-04 DIAGNOSIS — R269 Unspecified abnormalities of gait and mobility: Secondary | ICD-10-CM | POA: Diagnosis not present

## 2015-07-04 DIAGNOSIS — I129 Hypertensive chronic kidney disease with stage 1 through stage 4 chronic kidney disease, or unspecified chronic kidney disease: Secondary | ICD-10-CM | POA: Diagnosis not present

## 2015-07-07 ENCOUNTER — Encounter (HOSPITAL_COMMUNITY): Payer: Medicare Other

## 2015-07-07 ENCOUNTER — Other Ambulatory Visit: Payer: Self-pay | Admitting: Pharmacist

## 2015-07-07 DIAGNOSIS — R269 Unspecified abnormalities of gait and mobility: Secondary | ICD-10-CM | POA: Diagnosis not present

## 2015-07-07 DIAGNOSIS — J449 Chronic obstructive pulmonary disease, unspecified: Secondary | ICD-10-CM | POA: Diagnosis not present

## 2015-07-07 DIAGNOSIS — I4891 Unspecified atrial fibrillation: Secondary | ICD-10-CM | POA: Diagnosis not present

## 2015-07-07 DIAGNOSIS — I129 Hypertensive chronic kidney disease with stage 1 through stage 4 chronic kidney disease, or unspecified chronic kidney disease: Secondary | ICD-10-CM | POA: Diagnosis not present

## 2015-07-07 DIAGNOSIS — Z952 Presence of prosthetic heart valve: Secondary | ICD-10-CM | POA: Diagnosis not present

## 2015-07-07 DIAGNOSIS — R531 Weakness: Secondary | ICD-10-CM | POA: Diagnosis not present

## 2015-07-07 NOTE — Patient Outreach (Signed)
[facility]) Care Management  [facility]   07/07/2015  [name] [name] [birth year] [id number]  Subjective: [name] [name] is a 81 y.o. male who was referred to [facility] for medication adherence.  I reviewed a message from Dr. Tyrone Sage reporting that the patient wasn't sure of what dose of levothyroxine he was supposed to be on.   I called patient to follow up. He reports that he is taking the levothyroxine 175 mcg daily as reported in Epic. However, there was some confusion as different providers were recommending that he take something different. For now though, he is taking 175 mcg and will follow up with his primary care physician.  He denies any other issues with his medications. He reports that he is taking all of them and denies any questions or concerns. He does not feel that I need to come out and see him again.     Objective:   Current Medications: Current Outpatient Prescriptions  Medication Sig Dispense Refill  . albuterol (PROVENTIL HFA;VENTOLIN HFA) 108 (90 BASE) MCG/ACT inhaler Inhale 2 puffs into the lungs every 6 (six) hours as needed for wheezing or shortness of breath.    Marland Kitchen amLODipine (NORVASC) 2.5 MG tablet Take 2 tablets (5 mg total) by mouth daily. 30 tablet 11  . apixaban (ELIQUIS) 5 MG TABS tablet Take 2.5 mg by mouth 2 (two) times daily.     Marland Kitchen atorvastatin (LIPITOR) 10 MG tablet Take 1 tablet (10 mg total) by mouth daily. 30 tablet 0  . buPROPion (WELLBUTRIN XL) 300 MG 24 hr tablet Take 1 tablet (300 mg total) by mouth daily. 30 tablet 1  . busPIRone (BUSPAR) 15 MG tablet Take 1 tablet (15 mg total) by mouth 2 (two) times daily. 60 tablet 1  . calcium carbonate (TUMS - DOSED IN MG ELEMENTAL CALCIUM) 500 MG chewable tablet Chew 1 tablet by mouth as needed for indigestion or heartburn.    . carvedilol (COREG) 25 MG tablet Take 1 tablet (25 mg total) by mouth 2 (two) times daily. 60 tablet 11  . clonazePAM (KLONOPIN) 0.5 MG tablet  Take 1 tablet (0.5 mg total) by mouth at bedtime. 30 tablet 0  . digoxin (LANOXIN) 0.125 MG tablet Take 125 mcg by mouth daily.  0  . dutasteride (AVODART) 0.5 MG capsule Take 1 capsule (0.5 mg total) by mouth daily. 30 capsule 1  . escitalopram (LEXAPRO) 20 MG tablet Take 1 tablet (20 mg total) by mouth daily. 30 tablet 1  . Eszopiclone 3 MG TABS Take 3 mg by mouth at bedtime.  0  . fluticasone (FLONASE) 50 MCG/ACT nasal spray Place 1 spray into both nostrils daily. 9.9 g 2  . folic acid (FOLVITE) 1 MG tablet Take 1 tablet (1 mg total) by mouth daily. 30 tablet 1  . lamoTRIgine (LAMICTAL) 100 MG tablet Take 1 tablet (100 mg total) by mouth 2 (two) times daily. Breakfast and dinner 60 tablet 1  . levothyroxine (SYNTHROID, LEVOTHROID) 175 MCG tablet Take 175 mcg by mouth daily before breakfast.    . Melatonin 3 MG TABS Take 1 tablet by mouth at bedtime.    . traMADol (ULTRAM) 50 MG tablet Take 1 tablet (50 mg total) by mouth every 4 (four) hours as needed for moderate pain. 90 tablet 0   No current facility-administered medications for this visit.    Functional Status: In your present state of health, do you have any difficulty performing the following activities: 06/20/2015 06/16/2015  Hearing? Malvin JohnsY Y  Vision? N Y  Difficulty concentrating or making decisions? Malvin JohnsY Y  Walking or climbing stairs? Y Y  Dressing or bathing? N N  Doing errands, shopping? N N  Preparing Food and eating ? Y -  Using the Toilet? Y -  In the past six months, have you accidently leaked urine? Y -  Do you have problems with loss of bowel control? N -  Managing your Medications? Y -  Managing your Finances? N -  Housekeeping or managing your Housekeeping? N -    Fall/Depression Screening: PHQ 2/9 Scores 06/20/2015 05/13/2015 04/30/2015  PHQ - 2 Score 6 2 3   PHQ- 9 Score - 9 19    Assessment: 1. Medication reconciliation: completed - confirmed with patient that he is taking levothyroxine 175 mcg daily.    Plan: 1. Medication reconciliation: completed and patient will continue to take medications as prescribed. Will be available for further assistance as needed. Patient has my contact information and can contact me if he has any questions or concerns.     Juanita CraverStacey Karl, PharmD, BCPS Clinical Pharmacist [facility] [phone]

## 2015-07-09 ENCOUNTER — Encounter (HOSPITAL_COMMUNITY): Payer: Medicare Other

## 2015-07-09 DIAGNOSIS — J449 Chronic obstructive pulmonary disease, unspecified: Secondary | ICD-10-CM | POA: Diagnosis not present

## 2015-07-09 DIAGNOSIS — R531 Weakness: Secondary | ICD-10-CM | POA: Diagnosis not present

## 2015-07-09 DIAGNOSIS — I4891 Unspecified atrial fibrillation: Secondary | ICD-10-CM | POA: Diagnosis not present

## 2015-07-09 DIAGNOSIS — Z952 Presence of prosthetic heart valve: Secondary | ICD-10-CM | POA: Diagnosis not present

## 2015-07-09 DIAGNOSIS — R269 Unspecified abnormalities of gait and mobility: Secondary | ICD-10-CM | POA: Diagnosis not present

## 2015-07-09 DIAGNOSIS — I129 Hypertensive chronic kidney disease with stage 1 through stage 4 chronic kidney disease, or unspecified chronic kidney disease: Secondary | ICD-10-CM | POA: Diagnosis not present

## 2015-07-10 ENCOUNTER — Other Ambulatory Visit: Payer: Self-pay | Admitting: Adult Health

## 2015-07-11 ENCOUNTER — Encounter (HOSPITAL_COMMUNITY): Payer: Medicare Other

## 2015-07-14 ENCOUNTER — Encounter (HOSPITAL_COMMUNITY): Payer: Medicare Other

## 2015-07-14 DIAGNOSIS — R531 Weakness: Secondary | ICD-10-CM | POA: Diagnosis not present

## 2015-07-14 DIAGNOSIS — R269 Unspecified abnormalities of gait and mobility: Secondary | ICD-10-CM | POA: Diagnosis not present

## 2015-07-14 DIAGNOSIS — J449 Chronic obstructive pulmonary disease, unspecified: Secondary | ICD-10-CM | POA: Diagnosis not present

## 2015-07-14 DIAGNOSIS — I4891 Unspecified atrial fibrillation: Secondary | ICD-10-CM | POA: Diagnosis not present

## 2015-07-14 DIAGNOSIS — I129 Hypertensive chronic kidney disease with stage 1 through stage 4 chronic kidney disease, or unspecified chronic kidney disease: Secondary | ICD-10-CM | POA: Diagnosis not present

## 2015-07-14 DIAGNOSIS — Z952 Presence of prosthetic heart valve: Secondary | ICD-10-CM | POA: Diagnosis not present

## 2015-07-15 ENCOUNTER — Other Ambulatory Visit: Payer: Self-pay

## 2015-07-15 ENCOUNTER — Telehealth: Payer: Self-pay | Admitting: Cardiovascular Disease

## 2015-07-15 DIAGNOSIS — R531 Weakness: Secondary | ICD-10-CM | POA: Diagnosis not present

## 2015-07-15 DIAGNOSIS — J449 Chronic obstructive pulmonary disease, unspecified: Secondary | ICD-10-CM | POA: Diagnosis not present

## 2015-07-15 DIAGNOSIS — I4891 Unspecified atrial fibrillation: Secondary | ICD-10-CM | POA: Diagnosis not present

## 2015-07-15 DIAGNOSIS — I129 Hypertensive chronic kidney disease with stage 1 through stage 4 chronic kidney disease, or unspecified chronic kidney disease: Secondary | ICD-10-CM | POA: Diagnosis not present

## 2015-07-15 DIAGNOSIS — Z952 Presence of prosthetic heart valve: Secondary | ICD-10-CM | POA: Diagnosis not present

## 2015-07-15 DIAGNOSIS — R269 Unspecified abnormalities of gait and mobility: Secondary | ICD-10-CM | POA: Diagnosis not present

## 2015-07-15 NOTE — Telephone Encounter (Signed)
F/u  Adv home care returning Rn phone call. Please call back and discuss

## 2015-07-15 NOTE — Telephone Encounter (Signed)
Home Health called and stated that they don't think that the patient is getting any stronger with pt.  Said that he is due to be d/c'd next week.  Took b/p sitting today @130 /98 and standing 110/68 with hr of 50.   Just had thyroid med adjustment last week.  Sometimes feels unsteady just after standing with cain.

## 2015-07-15 NOTE — Telephone Encounter (Signed)
LMTCB 07-15-15

## 2015-07-15 NOTE — Telephone Encounter (Signed)
LMTCB 07-15-15 

## 2015-07-15 NOTE — Patient Outreach (Signed)
[facility]) Care Management  [place]  07/15/2015   [name] [name] 1931-11-07 [id number]  Patient states things are going well.  He is receiving PT at home, and states he has started walking around the block every day.  He continues to be SOB with exertion, but states that it resolves with rest. He uses a cane for ambulation, and reports no recent falls.  Patient reports he is working on decreasing the amount of salt in his diet.  He states he has started reading labels and is avoiding most processed foods and deli meats.  Patient reports his BP cuff is not working properly, and he intends to get it replaced.  States his BP is checked at the pharmacy and by the physical therapist and that it has been 'good'.  Patient states he weighs most days, but has difficulty reading the scales.  States he can tell if it is staying around 140 pounds.   Current Medications:  Current Outpatient Prescriptions  Medication Sig Dispense Refill  . amLODipine (NORVASC) 2.5 MG tablet Take 2 tablets (5 mg total) by mouth daily. 30 tablet 11  . apixaban (ELIQUIS) 5 MG TABS tablet Take 2.5 mg by mouth 2 (two) times daily.     Marland Kitchen atorvastatin (LIPITOR) 10 MG tablet Take 1 tablet (10 mg total) by mouth daily. 30 tablet 0  . buPROPion (WELLBUTRIN XL) 300 MG 24 hr tablet Take 1 tablet (300 mg total) by mouth daily. 30 tablet 1  . busPIRone (BUSPAR) 15 MG tablet Take 1 tablet (15 mg total) by mouth 2 (two) times daily. 60 tablet 1  . calcium carbonate (TUMS - DOSED IN MG ELEMENTAL CALCIUM) 500 MG chewable tablet Chew 1 tablet by mouth as needed for indigestion or heartburn.    . carvedilol (COREG) 25 MG tablet Take 1 tablet (25 mg total) by mouth 2 (two) times daily. 60 tablet 11  . clonazePAM (KLONOPIN) 0.5 MG tablet Take 1 tablet (0.5 mg total) by mouth at bedtime. 30 tablet 0  . digoxin (LANOXIN) 0.125 MG tablet Take 125 mcg by mouth daily.  0  . dutasteride (AVODART) 0.5 MG capsule Take  1 capsule (0.5 mg total) by mouth daily. 30 capsule 1  . escitalopram (LEXAPRO) 20 MG tablet Take 1 tablet (20 mg total) by mouth daily. 30 tablet 1  . Eszopiclone 3 MG TABS Take 3 mg by mouth at bedtime.  0  . fluticasone (FLONASE) 50 MCG/ACT nasal spray Place 1 spray into both nostrils daily. 9.9 g 2  . folic acid (FOLVITE) 1 MG tablet Take 1 tablet (1 mg total) by mouth daily. 30 tablet 1  . lamoTRIgine (LAMICTAL) 100 MG tablet Take 1 tablet (100 mg total) by mouth 2 (two) times daily. Breakfast and dinner 60 tablet 1  . levothyroxine (SYNTHROID, LEVOTHROID) 175 MCG tablet Take 175 mcg by mouth daily before breakfast.    . Melatonin 3 MG TABS Take 1 tablet by mouth at bedtime.    . traMADol (ULTRAM) 50 MG tablet Take 1 tablet (50 mg total) by mouth every 4 (four) hours as needed for moderate pain. 90 tablet 0  . albuterol (PROVENTIL HFA;VENTOLIN HFA) 108 (90 BASE) MCG/ACT inhaler Inhale 2 puffs into the lungs every 6 (six) hours as needed for wheezing or shortness of breath.     No current facility-administered medications for this visit.   Patient states his son fills his med boxes weekly, but that there are still times when he forgets  to take his medications, especially in the morning.   [facility] CM Care Plan Problem One        Most Recent Value   Care Plan Problem One  Chronic disease management needs related to COPD.   Role Documenting the Problem One  [place] for Problem One  Active   [facility] Long Term Goal (31-90 days)  Patient stated goal is prevention of unnecessary hospital admissions for the next 60 days.   [facility] Long Term Goal Start Date  05/13/15   [facility] Long Term Goal Met Date  07/15/15   Interventions for Problem One Long Term Goal  -- [facility] admissions. Reinforced COPD action plan, [facility] availability]   [facility] CM Short Term Goal #1 (0-30 days)  Patient will adher to medication regimen within 30 days.   [facility] CM Short Term Goal #1 Start Date  05/13/15   [facility] CM Short Term Goal #1  Met Date  06/16/15   Interventions for Short Term Goal #1  Using med box but sometimes forgets.  Educated re regular dosing reminders. [Reports use of med box and pharmacy consult have helped.]   [facility] CM Short Term Goal #2 (0-30 days)  Patient will not suffer a fall for the next 30 days.   [facility] CM Short Term Goal #2 Start Date  05/13/15   [facility] CM Short Term Goal #2 Met Date  07/15/15   Interventions for Short Term Goal #2  Reinforced fall precaution strategies. [Fall prevention instruction provided.  Will mail EMMI.]   [facility] CM Short Term Goal #3 (0-30 days)  -- [Patient will check and record daily BP.]   [facility] CM Short Term Goal #3 Start Date  06/16/15   Interventions for Short Tern Goal #3  Encouraged he get new BP cuff and monitor daily BP.     Assessment: Patient reports no recent falls, and no recent trips to the ED.  He appears forgetful, and admits to having difficulty remembering to take his medications or keep up with paper work such as returning [facility] consent forms.  Plan: Patient will replace BP cuff and start to monitor and record daily BP readings.           Patient will continue to include low salt guidelines in his diet.           Patient will talk with son about completion of advance directives.           Patient will practice fall precaution measures.                      RN Health Coach will mail duplicate consent forms, a [facility] magnet with the 24 hour nurse line number,                   and a [facility] COPD Action Plan magnet for his refrigerator.           RN will follow up in approximately one month.  Candie Mile, RN, MSN [place] [phone] Fax [phone]

## 2015-07-15 NOTE — Telephone Encounter (Signed)
New message      Pt c/o BP issue: STAT if pt c/o blurred vision, one-sided weakness or slurred speech  1. What are your last 5 BP readings? 130-98 sitting and 110/78 standing, HR 50 sitting  2. Are you having any other symptoms (ex. Dizziness, headache, blurred vision, passed out)?  Dizziness with standing 3. What is your BP issue? Pt does not feel good.  He has zero energy.  Please advise

## 2015-07-16 ENCOUNTER — Encounter (HOSPITAL_COMMUNITY): Payer: Medicare Other

## 2015-07-16 ENCOUNTER — Telehealth: Payer: Self-pay | Admitting: Cardiovascular Disease

## 2015-07-16 ENCOUNTER — Other Ambulatory Visit: Payer: Self-pay | Admitting: *Deleted

## 2015-07-16 MED ORDER — DUTASTERIDE 0.5 MG PO CAPS: 0.5000 mg | ORAL_CAPSULE | Freq: Every day | ORAL | Status: DC

## 2015-07-16 NOTE — Telephone Encounter (Signed)
[facility] called wanting a verbal order to have nurse assess patient. Advised that we had been contacted by Way[facility]Beth [facility] yesterday and the Dr had advised that the patient should return to his pcp for evaluation.

## 2015-07-16 NOTE — Telephone Encounter (Signed)
Called Home health nurse and advised that there isn't much to offer from a cardiology standpoint.  Advised to seek f/u with PCP.

## 2015-07-16 NOTE — Telephone Encounter (Signed)
NEw Message  [facility] calling for verbal order for home healthcare. Please call back and discuss.

## 2015-07-16 NOTE — Telephone Encounter (Signed)
Returned call - LMTCB

## 2015-07-16 NOTE — Telephone Encounter (Signed)
He should contact his general medical doctor.   I'm afraid I dont have much more to offer for general strength training.

## 2015-07-18 ENCOUNTER — Encounter (HOSPITAL_COMMUNITY): Payer: Medicare Other

## 2015-07-21 ENCOUNTER — Encounter (HOSPITAL_COMMUNITY): Payer: Medicare Other

## 2015-07-22 DIAGNOSIS — I4891 Unspecified atrial fibrillation: Secondary | ICD-10-CM | POA: Diagnosis not present

## 2015-07-22 DIAGNOSIS — Z952 Presence of prosthetic heart valve: Secondary | ICD-10-CM | POA: Diagnosis not present

## 2015-07-22 DIAGNOSIS — I129 Hypertensive chronic kidney disease with stage 1 through stage 4 chronic kidney disease, or unspecified chronic kidney disease: Secondary | ICD-10-CM | POA: Diagnosis not present

## 2015-07-22 DIAGNOSIS — J449 Chronic obstructive pulmonary disease, unspecified: Secondary | ICD-10-CM | POA: Diagnosis not present

## 2015-07-22 DIAGNOSIS — R531 Weakness: Secondary | ICD-10-CM | POA: Diagnosis not present

## 2015-07-22 DIAGNOSIS — R269 Unspecified abnormalities of gait and mobility: Secondary | ICD-10-CM | POA: Diagnosis not present

## 2015-07-23 ENCOUNTER — Encounter (HOSPITAL_COMMUNITY): Payer: Medicare Other

## 2015-07-23 DIAGNOSIS — R269 Unspecified abnormalities of gait and mobility: Secondary | ICD-10-CM | POA: Diagnosis not present

## 2015-07-23 DIAGNOSIS — R531 Weakness: Secondary | ICD-10-CM | POA: Diagnosis not present

## 2015-07-23 DIAGNOSIS — I4891 Unspecified atrial fibrillation: Secondary | ICD-10-CM | POA: Diagnosis not present

## 2015-07-23 DIAGNOSIS — J449 Chronic obstructive pulmonary disease, unspecified: Secondary | ICD-10-CM | POA: Diagnosis not present

## 2015-07-23 DIAGNOSIS — I129 Hypertensive chronic kidney disease with stage 1 through stage 4 chronic kidney disease, or unspecified chronic kidney disease: Secondary | ICD-10-CM | POA: Diagnosis not present

## 2015-07-23 DIAGNOSIS — Z952 Presence of prosthetic heart valve: Secondary | ICD-10-CM | POA: Diagnosis not present

## 2015-07-28 ENCOUNTER — Encounter (HOSPITAL_COMMUNITY): Payer: Medicare Other

## 2015-07-30 ENCOUNTER — Encounter (HOSPITAL_COMMUNITY): Payer: Medicare Other

## 2015-08-01 ENCOUNTER — Encounter (HOSPITAL_COMMUNITY): Payer: Medicare Other

## 2015-08-11 ENCOUNTER — Other Ambulatory Visit: Payer: Self-pay | Admitting: Physical Medicine & Rehabilitation

## 2015-08-13 ENCOUNTER — Other Ambulatory Visit: Payer: Self-pay

## 2015-08-13 MED ORDER — FOLIC ACID 1 MG PO TABS: 1.0000 mg | ORAL_TABLET | Freq: Every day | ORAL | Status: DC

## 2015-08-14 ENCOUNTER — Other Ambulatory Visit: Payer: Self-pay

## 2015-08-14 NOTE — Patient Outreach (Signed)
[facility]) Care Management  08/14/2015  [name] [name] 1931-11-22 [id number]   Unsuccessful attempt to reach patient for scheduled appointment.  Unable to leave a message due to 'fast busy' line.  Will attempt to call again later today.  [name], RN, MSN RN [employer] [facility] [phone] Fax [phone]

## 2015-08-14 NOTE — Patient Outreach (Signed)
[facility]) Care Management  08/14/2015  [employer] Cleon Gustinshby [birth year] [id number]   Second unsuccessful attempt to reach patient today.  HIPPA appropriate message left requesting call back.  Another attempt to reach patient will be made within 5 working days.  Tyler Deisonnie Monserath Neff, RN, MSN RN [employer] [facility] [phone] Fax [phone]

## 2015-08-18 ENCOUNTER — Encounter: Payer: Self-pay | Admitting: Cardiovascular Disease

## 2015-08-18 ENCOUNTER — Other Ambulatory Visit: Payer: Self-pay | Admitting: Physical Medicine & Rehabilitation

## 2015-08-19 ENCOUNTER — Other Ambulatory Visit: Payer: Self-pay

## 2015-08-19 NOTE — Patient Outreach (Signed)
[facility]) Care Management  08/19/2015  [name] GlazierChrysler Luberto 1931-11-04 [id number]  Another attempt to reach patient for scheduled follow-up.  Spoke with patient's wife, who stated he was not at home. She went on to say that he was seen by an MD yesterday.  Message left with her requesting call back.  Plan:  Attempt to call back on 12-21 if patient does not call back.  [name], RN, MSN RN [employer] [facility] [phone] Fax [phone]

## 2015-08-19 NOTE — Patient Outreach (Signed)
[facility]) Care Management  [place]  08/19/2015   [name] [name] 03-27-1932 [id number]  Patient called back after message left earlier today.  Reports he has poor endurance and feels unsteady on his feet at times due to feeling dizzy or light-headed.  States he has a fear of falling, which limits his plans to start walking around the block for exercise.  States he does walk back and forth down a 30 foot hallway in the home about 10 times.  Reinforced fall prevention strategies including sitting on the side of the bed before rising, and using his cane or walker.  Patient has a new BP cuff which he is using most days.  Reports average BP is 140/86.  Encouraged him to check and record daily BP, and to take record to his next MD appointment.  Patient states his weight to be about around 257.  He does not weigh daily, and he does not weigh at the same time each day.  Instructed patient to weigh first thing in the morning before eating, and to keep a record.  Reinforced teaching about monitoring for sudden increase in weight and reporting same to MD.  Current Medications:  Current Outpatient Prescriptions  Medication Sig Dispense Refill  . apixaban (ELIQUIS) 5 MG TABS tablet Take 2.5 mg by mouth 2 (two) times daily.     . carvedilol (COREG) 25 MG tablet Take 1 tablet (25 mg total) by mouth 2 (two) times daily. 60 tablet 11  . albuterol (PROVENTIL HFA;VENTOLIN HFA) 108 (90 BASE) MCG/ACT inhaler Inhale 2 puffs into the lungs every 6 (six) hours as needed for wheezing or shortness of breath.    Marland Kitchen amLODipine (NORVASC) 2.5 MG tablet Take 2 tablets (5 mg total) by mouth daily. 30 tablet 11  . atorvastatin (LIPITOR) 10 MG tablet Take 1 tablet (10 mg total) by mouth daily. 30 tablet 0  . buPROPion (WELLBUTRIN XL) 300 MG 24 hr tablet Take 1 tablet (300 mg total) by mouth daily. 30 tablet 1  . busPIRone (BUSPAR) 15 MG tablet Take 1 tablet (15 mg total) by mouth 2 (two) times  daily. 60 tablet 1  . calcium carbonate (TUMS - DOSED IN MG ELEMENTAL CALCIUM) 500 MG chewable tablet Chew 1 tablet by mouth as needed for indigestion or heartburn.    . clonazePAM (KLONOPIN) 0.5 MG tablet Take 1 tablet (0.5 mg total) by mouth at bedtime. 30 tablet 0  . digoxin (LANOXIN) 0.125 MG tablet Take 125 mcg by mouth daily.  0  . dutasteride (AVODART) 0.5 MG capsule Take 1 capsule (0.5 mg total) by mouth daily. 30 capsule 0  . escitalopram (LEXAPRO) 20 MG tablet Take 1 tablet (20 mg total) by mouth daily. 30 tablet 1  . Eszopiclone 3 MG TABS Take 3 mg by mouth at bedtime.  0  . fluticasone (FLONASE) 50 MCG/ACT nasal spray Place 1 spray into both nostrils daily. 9.9 g 2  . folic acid (FOLVITE) 1 MG tablet Take 1 tablet (1 mg total) by mouth daily. 30 tablet 1  . lamoTRIgine (LAMICTAL) 100 MG tablet Take 1 tablet (100 mg total) by mouth 2 (two) times daily. Breakfast and dinner 60 tablet 1  . levothyroxine (SYNTHROID, LEVOTHROID) 175 MCG tablet Take 175 mcg by mouth daily before breakfast.    . Melatonin 3 MG TABS Take 1 tablet by mouth at bedtime.    . traMADol (ULTRAM) 50 MG tablet Take 1 tablet (50 mg total) by mouth every 4 (  four) hours as needed for moderate pain. 90 tablet 0   No current facility-administered medications for this visit.    [facility] CM Care Plan Problem One        Most Recent Value   Care Plan Problem One  Chronic disease management needs related to COPD.   Role Documenting the Problem One  [place] for Problem One  Active   [facility] Long Term Goal (31-90 days)  Patient stated goal is prevention of unnecessary hospital admissions for the next 60 days.   [facility] Long Term Goal Start Date  05/13/15   [facility] Long Term Goal Met Date  07/15/15   Interventions for Problem One Long Term Goal  Pt praised for avoiding ED. [No admissions. Reinforced COPD action plan, [facility] availability]   [facility] CM Short Term Goal #1 (0-30 days)  Patient will adher to medication regimen within  30 days.   [facility] CM Short Term Goal #1 Start Date  05/13/15   [facility] CM Short Term Goal #1 Met Date  06/16/15   Interventions for Short Term Goal #1  Using med box but sometimes forgets.  Educated re regular dosing reminders. [Reports use of med box and pharmacy consult have helped.]   [facility] CM Short Term Goal #2 (0-30 days)  Patient will not suffer a fall for the next 30 days.   [facility] CM Short Term Goal #2 Start Date  05/13/15   [facility] CM Short Term Goal #2 Met Date  07/15/15   Interventions for Short Term Goal #2  Reports no falls, but is sometimes dizzy and unsteady on his feet.  Reinforced sitting on side of bed before rising, use of cane or walker. [Fall prevention instruction provided.  Will mail EMMI.]   [facility] CM Short Term Goal #3 (0-30 days)  -- [Patient will check and record daily BP.]   [facility] CM Short Term Goal #3 Start Date  06/16/15   Interventions for Short Tern Goal #3  Encouraged he get new BP cuff and monitor daily BP. [Has new BP cuff. Encouraged to check/record daily.]       Assessment: Patient not taking BP or weighing every day.  Activity limited by fear of falling.  Plan: Patient will keep up-coming appointment with Dr. Chapman Fitch (PCP).           Patient will weigh daily first thing, and record weight.           Patient will monitor and record BP daily.           Patient will practice safe precautions to prevent falls.           RN Health Coach will follow up within one month.  Candie Mile, RN, MSN [place] [phone] Fax [phone]

## 2015-08-20 ENCOUNTER — Ambulatory Visit: Payer: Self-pay

## 2015-08-25 ENCOUNTER — Other Ambulatory Visit: Payer: Self-pay | Admitting: Physical Medicine & Rehabilitation

## 2015-08-28 DIAGNOSIS — N471 Phimosis: Secondary | ICD-10-CM | POA: Diagnosis not present

## 2015-09-09 ENCOUNTER — Ambulatory Visit: Payer: Medicare Other | Admitting: Cardiovascular Disease

## 2015-09-10 DIAGNOSIS — E785 Hyperlipidemia, unspecified: Secondary | ICD-10-CM | POA: Diagnosis not present

## 2015-09-10 DIAGNOSIS — N183 Chronic kidney disease, stage 3 (moderate): Secondary | ICD-10-CM | POA: Diagnosis not present

## 2015-09-10 DIAGNOSIS — I10 Essential (primary) hypertension: Secondary | ICD-10-CM | POA: Diagnosis not present

## 2015-09-15 ENCOUNTER — Other Ambulatory Visit: Payer: Self-pay | Admitting: Pharmacist

## 2015-09-15 NOTE — Patient Outreach (Signed)
[facility]) Care Management  [facility]   09/15/2015  [name] GlazierChrysler [name] [birth year] [id number]  [name] [name] is a 82 y.o. male who was being followed by pharmacy for medication management. There are no current issues that need to be addressed so will close the patient to the pharmacy program.  Juanita CraverStacey Karl, PharmD, [facility] Clinical Pharmacist [facility] [phone]

## 2015-09-16 ENCOUNTER — Ambulatory Visit: Payer: Self-pay

## 2015-09-17 ENCOUNTER — Other Ambulatory Visit: Payer: Self-pay

## 2015-09-17 NOTE — Patient Outreach (Signed)
[facility]) Care Management  09/17/2015  [name] [name] 1931/10/19 [id number]   Telephone call for scheduled appointment.  Patient's wife reported patient was not at home.  She suggested I call back in the morning.  Appointment rescheduled for 09-18-15.  Tyler Deis, RN, MSN RN [name] [phone] Fax [phone]

## 2015-09-18 ENCOUNTER — Other Ambulatory Visit: Payer: Self-pay

## 2015-09-18 ENCOUNTER — Other Ambulatory Visit: Payer: Self-pay | Admitting: Cardiovascular Disease

## 2015-09-18 NOTE — Telephone Encounter (Signed)
Eliquis 2.5 mg BID Thanks.

## 2015-09-18 NOTE — Patient Outreach (Signed)
[facility]) Care Management  09/18/2015  Chrysler Macneal 1931/11/30 [id number]   Second unsuccessful attempt to reach patient this week.  Will reschedule a call within 5 working days.  Tyler Deis, RN, MSN RN [name] [phone] Fax [phone]

## 2015-09-18 NOTE — Telephone Encounter (Signed)
Please advise as to what patients current dose should be. Thanks, MI

## 2015-09-23 ENCOUNTER — Other Ambulatory Visit: Payer: Self-pay

## 2015-09-23 NOTE — Patient Outreach (Addendum)
[facility]) Care Management  09/23/2015  Tori Cupps May 19, 1932 [id number]   Third unsuccessful attempt to reach patient in the last week.  Unable to leave voice mail.  Will attempt call again within 5 working days.  Plan:  In Basket message sent to Dr. Jillyn Hidden (PCP) requesting update on any patient status changes of which she is aware.  Tyler Deis, RN, MSN RN [name] [phone] Fax [phone]

## 2015-09-25 ENCOUNTER — Other Ambulatory Visit: Payer: Self-pay

## 2015-09-25 NOTE — Patient Outreach (Signed)
[facility]) Care Management  [name] [name]  09/25/2015   [name] [name] 04-Aug-1932 [id number]  Telephonic assessment today.  Patient was in bed when I called.  Stated he is not sleeping well at night, so was resting this morning.  Patient takes Melatonin for sleep, but he states it is not working.  Patient is now monitoring daily BPs and is keeping a written record.  Encouraged him to take record to appt with Dr. Chapman Fitch on 10-06-15.  Patient reports BP yesterday was 138/106 the first time he took it, but was lower when he rechecked it.  Patient reports weakness and dizziness which makes him fearful of falling.  He reports no recent falls, but has had 'near misses' when he caught himself before falling.    Patient reports abiding by low salt diet by reading labels and avoiding salty snacks.  He is also using Nutrisystem diet, and has lost 7 pounds since early December.  Patient reports shortness of breath with exertion and climbing stairs, relieved with 5 minutes of rest.  Encouraged activity to tolerance, but to pace activity.  Current Medications:  Current Outpatient Prescriptions  Medication Sig Dispense Refill  . albuterol (PROVENTIL HFA;VENTOLIN HFA) 108 (90 BASE) MCG/ACT inhaler Inhale 2 puffs into the lungs every 6 (six) hours as needed for wheezing or shortness of breath.    Marland Kitchen amLODipine (NORVASC) 2.5 MG tablet Take 2 tablets (5 mg total) by mouth daily. 30 tablet 11  . atorvastatin (LIPITOR) 10 MG tablet Take 1 tablet (10 mg total) by mouth daily. 30 tablet 0  . buPROPion (WELLBUTRIN XL) 300 MG 24 hr tablet Take 1 tablet (300 mg total) by mouth daily. 30 tablet 1  . busPIRone (BUSPAR) 15 MG tablet Take 1 tablet (15 mg total) by mouth 2 (two) times daily. 60 tablet 1  . calcium carbonate (TUMS - DOSED IN MG ELEMENTAL CALCIUM) 500 MG chewable tablet Chew 1 tablet by mouth as needed for indigestion or heartburn.    . carvedilol (COREG) 25 MG tablet Take 1 tablet (25 mg  total) by mouth 2 (two) times daily. 60 tablet 11  . clonazePAM (KLONOPIN) 0.5 MG tablet Take 1 tablet (0.5 mg total) by mouth at bedtime. 30 tablet 0  . digoxin (LANOXIN) 0.125 MG tablet Take 125 mcg by mouth daily.  0  . dutasteride (AVODART) 0.5 MG capsule Take 1 capsule (0.5 mg total) by mouth daily. 30 capsule 0  . ELIQUIS 2.5 MG TABS tablet TAKE 1 TABLET BY MOUTH TWICE A DAY 60 tablet 3  . escitalopram (LEXAPRO) 20 MG tablet Take 1 tablet (20 mg total) by mouth daily. 30 tablet 1  . Eszopiclone 3 MG TABS Take 3 mg by mouth at bedtime.  0  . fluticasone (FLONASE) 50 MCG/ACT nasal spray Place 1 spray into both nostrils daily. 9.9 g 2  . folic acid (FOLVITE) 1 MG tablet Take 1 tablet (1 mg total) by mouth daily. 30 tablet 1  . lamoTRIgine (LAMICTAL) 100 MG tablet Take 1 tablet (100 mg total) by mouth 2 (two) times daily. Breakfast and dinner 60 tablet 1  . levothyroxine (SYNTHROID, LEVOTHROID) 175 MCG tablet Take 175 mcg by mouth daily before breakfast.    . Melatonin 3 MG TABS Take 1 tablet by mouth at bedtime.    . traMADol (ULTRAM) 50 MG tablet Take 1 tablet (50 mg total) by mouth every 4 (four) hours as needed for moderate pain. 90 tablet 0   No current facility-administered  medications for this visit.    Functional Status:  In your present state of health, do you have any difficulty performing the following activities: 09/25/2015 06/20/2015  Hearing? [name] [name]  Vision? Y N  Difficulty concentrating or making decisions? [name] [name]  Walking or climbing stairs? Y Y  Dressing or bathing? N N  Doing errands, shopping? N N  Preparing Food and eating ? - Y  Using the Toilet? - Y  In the past six months, have you accidently leaked urine? - Y  Do you have problems with loss of bowel control? - N  Managing your Medications? - Y  Managing your Finances? - N  Housekeeping or managing your Housekeeping? - N    Fall/Depression Screening: Fall evaluation completed.  No recent falls reported.  Fall  prevention education reinforced.  PHQ 2/9 Scores 06/20/2015 05/13/2015 04/30/2015  PHQ - 2 Score 6 2 3   PHQ- 9 Score - 9 19   [facility] CM Care Plan Problem One        Most Recent Value   Care Plan Problem One  Chronic disease management needs related to COPD.   Role Documenting the Problem One  [place] for Problem One  Not Active   [facility] Long Term Goal (31-90 days)  Patient stated goal is prevention of unnecessary hospital admissions for the next 60 days.   [facility] Long Term Goal Start Date  05/13/15   [facility] Long Term Goal Met Date  07/15/15   Interventions for Problem One Long Term Goal  Pt praised for avoiding ED. [No admissions. Reinforced COPD action plan, [facility] availability]   [facility] CM Short Term Goal #1 (0-30 days)  Patient will adher to medication regimen within 30 days.   [facility] CM Short Term Goal #1 Start Date  05/13/15   [facility] CM Short Term Goal #1 Met Date  06/16/15   Interventions for Short Term Goal #1  Reports using med boxes, no problems. [Reports use of med box and pharmacy consult have helped.]   [facility] CM Short Term Goal #2 (0-30 days)  Patient will not suffer a fall for the next 30 days.   [facility] CM Short Term Goal #2 Start Date  05/13/15   [facility] CM Short Term Goal #2 Met Date  07/15/15   Interventions for Short Term Goal #2  Fall evaluation completed.  Education reinforced on fall prevention strategies. [Fall prevention instruction provided.  Will mail EMMI.]   [facility] CM Short Term Goal #3 (0-30 days)  -- [Patient will check and record daily BP.]   [facility] CM Short Term Goal #3 Start Date  06/16/15   [facility] CM Short Term Goal #3 Met Date  09/25/15   Interventions for Short Tern Goal #3  Pt reports he has a BP cuff and is checking and recording daily BPs. [Has new BP cuff. Encouraged to check/record daily.]     Assessment: Chief concerns reported today were fear of falling and not sleeping well.  Plan: Patient will practice safety precautions to prevent falls.           Patient will keep  appointment with PCP on 10-06-15, will take record of BPs, and report sleeping problems.           RN Health Coach will follow up within one month.  Candie Mile, RN, MSN [place] [phone] Fax [phone]

## 2015-09-29 ENCOUNTER — Encounter: Payer: Self-pay | Admitting: Cardiovascular Disease

## 2015-09-30 ENCOUNTER — Ambulatory Visit: Payer: Medicare Other | Admitting: Cardiovascular Disease

## 2015-10-07 ENCOUNTER — Ambulatory Visit: Payer: Medicare Other | Admitting: Cardiovascular Disease

## 2015-10-10 DIAGNOSIS — E039 Hypothyroidism, unspecified: Secondary | ICD-10-CM | POA: Diagnosis not present

## 2015-10-10 DIAGNOSIS — R42 Dizziness and giddiness: Secondary | ICD-10-CM | POA: Diagnosis not present

## 2015-10-10 DIAGNOSIS — J019 Acute sinusitis, unspecified: Secondary | ICD-10-CM | POA: Diagnosis not present

## 2015-10-10 DIAGNOSIS — N183 Chronic kidney disease, stage 3 (moderate): Secondary | ICD-10-CM | POA: Diagnosis not present

## 2015-10-10 DIAGNOSIS — E785 Hyperlipidemia, unspecified: Secondary | ICD-10-CM | POA: Diagnosis not present

## 2015-10-10 DIAGNOSIS — R6884 Jaw pain: Secondary | ICD-10-CM | POA: Diagnosis not present

## 2015-10-10 DIAGNOSIS — G4733 Obstructive sleep apnea (adult) (pediatric): Secondary | ICD-10-CM | POA: Diagnosis not present

## 2015-10-10 DIAGNOSIS — R8299 Other abnormal findings in urine: Secondary | ICD-10-CM | POA: Diagnosis not present

## 2015-10-10 DIAGNOSIS — I10 Essential (primary) hypertension: Secondary | ICD-10-CM | POA: Diagnosis not present

## 2015-10-10 LAB — BASIC METABOLIC PANEL
BUN: 23 mg/dL — AB (ref 4–21)
CREATININE: 1.5 mg/dL — AB (ref <=1.3)
Glucose: 129 mg/dL
Potassium: 4.3 mmol/L (ref 3.4–5.3)
Sodium: 141 mmol/L (ref 137–147)

## 2015-10-10 LAB — CBC AND DIFFERENTIAL
HCT: 42 % (ref 41–53)
Hemoglobin: 13.9 g/dL (ref 13.5–17.5)
PLATELETS: 206 10*3/uL (ref 150–399)
WBC: 7.8 10*3/mL

## 2015-10-10 LAB — LIPID PANEL
CHOLESTEROL: 118 mg/dL (ref 0–200)
HDL: 29 mg/dL — AB (ref 35–70)
LDL CALC: 50 mg/dL
Triglycerides: 195 mg/dL — AB (ref 40–160)

## 2015-10-10 LAB — HEPATIC FUNCTION PANEL: AST: 17 U/L (ref 14–40)

## 2015-10-13 ENCOUNTER — Ambulatory Visit: Payer: Medicare Other | Admitting: Neurology

## 2015-10-13 ENCOUNTER — Other Ambulatory Visit: Payer: Self-pay | Admitting: Physical Medicine & Rehabilitation

## 2015-10-13 DIAGNOSIS — F411 Generalized anxiety disorder: Secondary | ICD-10-CM | POA: Diagnosis not present

## 2015-10-13 DIAGNOSIS — F39 Unspecified mood [affective] disorder: Secondary | ICD-10-CM | POA: Diagnosis not present

## 2015-10-13 DIAGNOSIS — F332 Major depressive disorder, recurrent severe without psychotic features: Secondary | ICD-10-CM | POA: Diagnosis not present

## 2015-10-20 ENCOUNTER — Other Ambulatory Visit: Payer: Self-pay | Admitting: Physical Medicine & Rehabilitation

## 2015-10-23 ENCOUNTER — Other Ambulatory Visit: Payer: Self-pay

## 2015-10-23 NOTE — Patient Outreach (Signed)
[facility]) Care Management  10/23/2015  [name] [name] 09-17-1931 [id number]  Telephone assessment completed.  Patient reports he and his wife are both recovering from a respiratory infection.  He was treated for same by Dr. Jillyn Hidden when he saw her recently, and reports he is finishing up an antibiotic ordered by her.  Patient reports he discussed his fear of falling, and the "wooziness" that he feels, with Dr. Jillyn Hidden.  He states that Dr. Jillyn Hidden is referring him to a neurologist for evaluation, but he hasn't heard about an appointment yet.  Patient reports ongoing efforts to decrease salt intake.  He is reading labels, but reports he had a can of soup for lunch yesterday despite the amount of salt listed on the label.  Education reinforced on low salt guidelines.  Patient reports he checks his BP about every other day.  His last reading was 132/89.  Plan:  Patient will complete antibiotics and use COPD action plan to monitor respiratory symptoms.           Patient will continue checking BP and watching salt intake.           RN will follow up in approximately one month.  Tyler Deis, RN, MSN RN [name] [phone] Fax [phone]

## 2015-10-27 ENCOUNTER — Other Ambulatory Visit: Payer: Self-pay | Admitting: Physical Medicine & Rehabilitation

## 2015-11-03 ENCOUNTER — Encounter: Payer: Self-pay | Admitting: Neurology

## 2015-11-03 ENCOUNTER — Telehealth: Payer: Self-pay | Admitting: *Deleted

## 2015-11-03 ENCOUNTER — Other Ambulatory Visit: Payer: Self-pay | Admitting: Physical Medicine & Rehabilitation

## 2015-11-03 ENCOUNTER — Encounter: Payer: Self-pay | Admitting: Cardiovascular Disease

## 2015-11-03 ENCOUNTER — Ambulatory Visit (INDEPENDENT_AMBULATORY_CARE_PROVIDER_SITE_OTHER): Payer: Medicare Other | Admitting: Neurology

## 2015-11-03 ENCOUNTER — Ambulatory Visit (INDEPENDENT_AMBULATORY_CARE_PROVIDER_SITE_OTHER): Payer: Medicare Other | Admitting: Cardiovascular Disease

## 2015-11-03 ENCOUNTER — Ambulatory Visit: Payer: Medicare Other | Attending: Neurology | Admitting: Physical Therapy

## 2015-11-03 VITALS — BP 138/80 | HR 68 | Ht 72.0 in | Wt 257.4 lb

## 2015-11-03 VITALS — BP 133/85 | HR 69 | Ht 72.0 in | Wt 230.2 lb

## 2015-11-03 DIAGNOSIS — R2689 Other abnormalities of gait and mobility: Secondary | ICD-10-CM

## 2015-11-03 DIAGNOSIS — M542 Cervicalgia: Secondary | ICD-10-CM | POA: Diagnosis not present

## 2015-11-03 DIAGNOSIS — R42 Dizziness and giddiness: Secondary | ICD-10-CM | POA: Insufficient documentation

## 2015-11-03 DIAGNOSIS — R29818 Other symptoms and signs involving the nervous system: Secondary | ICD-10-CM

## 2015-11-03 DIAGNOSIS — E785 Hyperlipidemia, unspecified: Secondary | ICD-10-CM

## 2015-11-03 DIAGNOSIS — I351 Nonrheumatic aortic (valve) insufficiency: Secondary | ICD-10-CM

## 2015-11-03 DIAGNOSIS — I951 Orthostatic hypotension: Secondary | ICD-10-CM

## 2015-11-03 DIAGNOSIS — I5022 Chronic systolic (congestive) heart failure: Secondary | ICD-10-CM

## 2015-11-03 DIAGNOSIS — I10 Essential (primary) hypertension: Secondary | ICD-10-CM

## 2015-11-03 DIAGNOSIS — R2681 Unsteadiness on feet: Secondary | ICD-10-CM | POA: Insufficient documentation

## 2015-11-03 DIAGNOSIS — R260 Ataxic gait: Secondary | ICD-10-CM

## 2015-11-03 DIAGNOSIS — R269 Unspecified abnormalities of gait and mobility: Secondary | ICD-10-CM | POA: Insufficient documentation

## 2015-11-03 DIAGNOSIS — I2510 Atherosclerotic heart disease of native coronary artery without angina pectoris: Secondary | ICD-10-CM

## 2015-11-03 NOTE — Patient Instructions (Signed)
Medication Instructions:  STOP Digoxin   Labwork: Your physician recommends that you return for lab work in: 6 months on the day of or a few days before your office visit with Dr. [name].  You will need to FAST for this appointment - nothing to eat or drink after midnight the night before except water.   Testing/Procedures: None Ordered   Follow-Up: Your physician wants you to follow-up in: 6 months with Dr. [name].  You will receive a reminder letter in the mail two months in advance. If you don't receive a letter, please call our office to schedule the follow-up appointment.   If you need a refill on your cardiac medications before your next appointment, please call your pharmacy.   Thank you for choosing [facility]! [name] BridegroomMichelle Dilyn Osoria, RN [phone]

## 2015-11-03 NOTE — Progress Notes (Signed)
Cardiology Office Note   Date:  11/03/2015   ID:  [name] [name], DOB [birth year], MRN [id number]  PCP:  [name] Saupe, MD  Cardiologist:   [name] Mixer, MD   Chief Complaint  Patient presents with  . Follow-up    CAD   Problem List: 1. Aortic insufficiency- he has severe AI.  - s/p AVR   2. Hyperlipidemia  3. HTN-  4. Obstructive sleep apnea 5. AAA - 2007 , [name] [name] 6. Hypothyroidism 7. COPD  8. Atrial fib:  -    History of Present Illness: [name] [name] is a 82 y.o. male who presents for evaluation for possible aortic valve replacement . He has been followed by the cardiologist at [facility] for years.   He wants to have his AVR here. Has chronic shortness of breath.  Gets worn out taking a load of laundry up the stairs.  Has had recent echos at [facility].  (have been incorporated in our system )   His overall condition has declined over the past 6 months.  Has some balance issues.  Has orthostatic hypotension.   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   Can walk 1/4 mile  Can climb 2 flights of stairs.  No PND or orthopnea.   No syncope.    February 05, 2015: He has been seen by Dr. Tyrone [name].  The plan is to consider AVR with possible MAZE  next week. He had an echo and cath at [facility] in January   Sept. 8, 2016:  Has had AVR  Doing ok  Oct. 6, 2016:  Pt is s/p AVR,  Had CABG. , atrial fib Persistently elevated BP     Occasionally eats ham and cheese for lunch. Eats frozen dinners for dinner almost every night.    Eats at [facility] if he does not eat the [employer]. Also goes to [employer] once a week.   Has some chest wall pain   November 03, 2015  Still very weak. Has DOE with any exertion Also has orthostatic hypotension .   Just came from the neurologist office.   Will be doing PT to try to help with this unsteadiness.   Has been watching his salt.   Past Medical  History  Diagnosis Date  . Atrial fibrillation (HCC)   . Hypothyroidism   . Plantar fasciitis   . Seasonal allergies   . Decreased testosterone level   . Varicose veins   . Hypertension   . Hypercholesteremia   . Aortic valve insufficiency   . Depression   . Heart murmur   . DVT (deep venous thrombosis) (HCC) ~ 2013    "BLE"  . OSA (obstructive sleep apnea)     "suppose to wear mask; I throw it off in my sleep" (10/15/2014)  . COPD (chronic obstructive pulmonary disease) (HCC)   . Emphysema of lung (HCC)   . History of blood transfusion 1960    "lots; related to an accident"  . Arthritis     "wee bit; knees, elbows" (10/15/2014)  . Anxiety   . Falls frequently     > 20 times in the last year/notes 10/15/2014  . Syncope and collapse "several times"  . Complication of anesthesia     unsure of complications but pt. states there were complications  . Dysrhythmia     A. Fib  . Coronary artery disease   .  Shortness of breath dyspnea   . Chronic kidney disease     "down to ~ 1/2 of their regular use; I see kidney dr. in [place]" (10/15/2014)  . Urine frequency   . Urine incontinence   . Enlarged prostate   . GERD (gastroesophageal reflux disease)   . Difficult intubation     difficult intubation 12/03/09 and 03/27/10 ([facility]); glidescope used 03/27/10  . History of echocardiogram     post AVR >> Echo 7/16:  Mild LVH, EF 20-25%, AVR ok (peak 15 mmHg, mean 9 mmHg), MAC, mild MR, severe LAE, mild reduced RVSF, mild RAE, PASP 35 mmHg  . Cardiomyopathy [facility](HCC)     Past Surgical History  Procedure Laterality Date  . Abdominal aortic aneurysm repair  01/2006; 03/10/2006    [name] [name]/notes 01/12/2011  . Tonsillectomy    . Hernia repair    . Laparoscopic incisional / umbilical / ventral hernia repair  02/2010    VHR w/mesh/notes 03/28/2010  . Laparoscopic cholecystectomy  08/2009    w/IOC/notes 09/18/2009  . Ercp  11/2009    [name] [name]/notes 12/05/2009  . Gall stone    . Cardiac catheterization  1990's X 1;  09/2014    no stents  . Nose surgery    . Brain surgery  1960 X 4    "S/P got my skull busted"; pt. states removed internal carotid artery  . Aortic valve replacement N/A 02/11/2015    Procedure: AORTIC VALVE REPLACEMENT (AVR);  Surgeon: [name] OvensEdward B Gerhardt, MD;  [facility];  Service: Open Heart Surgery;  Laterality: N/A;  . Clipping of atrial appendage N/A 02/11/2015    Procedure: CLIPPING OF ATRIAL APPENDAGE;  Surgeon: [name] OvensEdward B Gerhardt, MD;  [facility];  Service: Open Heart Surgery;  Laterality: N/A;  . Tee without cardioversion N/A 02/11/2015    Procedure: TRANSESOPHAGEAL ECHOCARDIOGRAM (TEE);  Surgeon: [name] OvensEdward B Gerhardt, MD;  [facility];  Service: Open Heart Surgery;  Laterality: N/A;     Current Outpatient Prescriptions  Medication Sig Dispense Refill  . amLODipine (NORVASC) 2.5 MG tablet Take 2 tablets (5 mg total) by mouth daily. 30 tablet 11  . atorvastatin (LIPITOR) 10 MG tablet Take 1 tablet (10 mg total) by mouth daily. 30 tablet 0  . buPROPion (WELLBUTRIN XL) 300 MG 24 hr tablet Take 1 tablet (300 mg total) by mouth daily. 30 tablet 1  . busPIRone (BUSPAR) 15 MG tablet Take 1 tablet (15 mg total) by mouth 2 (two) times daily. 60 tablet 1  . carvedilol (COREG) 25 MG tablet Take 1 tablet (25 mg total) by mouth 2 (two) times daily. 60 tablet 11  . clonazePAM (KLONOPIN) 0.5 MG tablet Take 1 tablet (0.5 mg total) by mouth at bedtime. 30 tablet 0  . digoxin (LANOXIN) 0.125 MG tablet Take 125 mcg by mouth daily.  0  . dutasteride (AVODART) 0.5 MG capsule Take 1 capsule (0.5 mg total) by mouth daily. 30 capsule 0  . ELIQUIS 2.5 MG TABS tablet TAKE 1 TABLET BY MOUTH TWICE A DAY 60 tablet 3  . escitalopram (LEXAPRO) 20 MG tablet Take 1 tablet (20 mg total) by mouth daily. 30 tablet 1  . folic acid (FOLVITE) 1 MG tablet Take 1 tablet (1 mg total) by mouth daily. 30 tablet 1  . lamoTRIgine (LAMICTAL) 100 MG tablet Take 1 tablet (100 mg total) by mouth 2 (two) times daily.  Breakfast and dinner 60 tablet 1  . levothyroxine (SYNTHROID, LEVOTHROID) 175 MCG tablet Take 175 mcg by  mouth daily before breakfast.    . mirtazapine (REMERON) 7.5 MG tablet Take 7.5 mg by mouth daily.    Marland Kitchen albuterol (PROVENTIL HFA;VENTOLIN HFA) 108 (90 BASE) MCG/ACT inhaler Inhale 2 puffs into the lungs every 6 (six) hours as needed for wheezing or shortness of breath. Reported on 11/03/2015    . fluticasone (FLONASE) 50 MCG/ACT nasal spray Place 1 spray into both nostrils daily. (Patient not taking: Reported on 11/03/2015) 9.9 g 2   No current facility-administered medications for this visit.    Allergies:   Review of patient's allergies indicates no known allergies.    Social History:  The patient  reports that he quit smoking about 41 years ago. His smoking use included Cigarettes. He has a 44 pack-year smoking history. He has never used smokeless tobacco. He reports that he drinks alcohol. He reports that he does not use illicit drugs.   Family History:  The patient's family history includes Cancer in his sister; Heart disease in his father; Rheum arthritis in his father and mother; Stroke in his sister.    ROS:  Please see the history of present illness.    Review of Systems: Constitutional:  denies fever, chills, diaphoresis, appetite change and fatigue.  HEENT: denies photophobia, eye pain, redness, hearing loss, ear pain, congestion, sore throat, rhinorrhea, sneezing, neck pain, neck stiffness and tinnitus.  Respiratory: denies SOB, DOE, cough, chest tightness, and wheezing.  Cardiovascular: denies chest pain, palpitations and leg swelling.  Gastrointestinal: denies nausea, vomiting, abdominal pain, diarrhea, constipation, blood in stool.  Genitourinary: denies dysuria, urgency, frequency, hematuria, flank pain and difficulty urinating.  Musculoskeletal: denies  myalgias, back pain, joint swelling, arthralgias and gait problem.   Skin: denies pallor, rash and wound.  Neurological:  denies dizziness, seizures, syncope, weakness, light-headedness, numbness and headaches.   Hematological: denies adenopathy, easy bruising, personal or family bleeding history.  Psychiatric/ Behavioral: denies suicidal ideation, mood changes, confusion, nervousness, sleep disturbance and agitation.       All other systems are reviewed and negative.    PHYSICAL EXAM: VS:  BP 138/80 mmHg  Pulse 68  Ht 6' (1.829 m)  Wt 257 lb 6.4 oz (116.756 kg)  BMI 34.90 kg/m2 , BMI Body mass index is 34.9 kg/(m^2). GEN: Well nourished, well developed, in no acute distress HEENT: normal Neck: no JVD, carotid bruits, or masses Cardiac: Irreg. Irreg. ; no murmurs, rubs, or gallops,no edema  Respiratory:  clear to auscultation bilaterally, normal work of breathing GI: soft, nontender, nondistended, + BS MS: no deformity or atrophy Skin: warm and dry, no rash Neuro:  Strength and sensation are intact Psych: normal   EKG:  EKG is not ordered today. The ekg ordered Feb. 17, 2016  demonstrates atrial fib with controlled rate.    Recent Labs: 02/12/2015: Magnesium 2.2 03/20/2015: Hemoglobin 11.4* 04/08/2015: ALT 17; Platelets 238; TSH 86.870* 06/05/2015: BUN 24; Creat 1.69*; Potassium 4.1; Sodium 140    Lipid Panel No results found for: CHOL, TRIG, HDL, CHOLHDL, VLDL, LDLCALC, LDLDIRECT    Wt Readings from Last 3 Encounters:  11/03/15 257 lb 6.4 oz (116.756 kg)  11/03/15 230 lb 3.2 oz (104.418 kg)  10/23/15 249 lb (112.946 kg)      Other studies Reviewed: Additional studies/ records that were reviewed today include: . Review of the above records demonstrates:    ASSESSMENT AND PLAN:  1. Aortic insufficiency :  S/p AVR     2. Hyperlipidemia  3. HTN - BP has been much better recently .  He has been watching his salt .   He has frequent episodes of orthostatic hypotension-in fact he had an episode of orthostatic hypotension while he was checking out today. This all seems to be stable.  We've instructed him to hold onto handrail, chair, or table when he stands up.  on carvedilol 25 mg twice a day.   4. Obstructive sleep apnea  5. AAA - 2007 , Cari Caraway  6. Hypothyroidism  7. COPD   8. Atrial fib:  -  He is in chronic atrial fibrillation. He's currently on Eliquis.   Will start Coreg instead of metoprolol   9. CAD :  Has disease of the RCA.  Was not bypassed due to inability of harvest adequate SVG for grafting .   10.  stage III chronic kidney disease.   Current medicines are reviewed at length with the patient today.  The patient does not have concerns regarding medicines.  The following changes have been made:  no change  Labs/ tests ordered today include:  No orders of the defined types were placed in this encounter.     Disposition:   FU with me in 3 months . Will have a nurse visit in 1 month.      Signed, Kaislyn Gulas, Deloris Ping, MD  11/03/2015 4:47 PM    [facility] [address] [place], [place], [place]  [address] Phone: [phone]; Fax: [phone]

## 2015-11-03 NOTE — Telephone Encounter (Signed)
No show

## 2015-11-03 NOTE — Patient Instructions (Signed)
Remember to drink plenty of fluid, eat healthy meals and do not skip any meals. Try to eat protein with a every meal and eat a healthy snack such as fruit or nuts in between meals. Try to keep a regular sleep-wake schedule and try to exercise daily, particularly in the form of walking, 20-30 minutes a day, if you can.   As far as diagnostic testing: CT of the head and neck, physical therapy for balance etraining  I would like to see you back after physical therapy, sooner if we need to. Please call us with any interim questions, concerns, problems, updates or refill requests.   Our phone number is [phone]. We also have an after hours call service for urgent matters and there is a physician on-call for urgent questions. For any emergencies you know to call 911 or go to the nearest emergency room

## 2015-11-03 NOTE — Progress Notes (Addendum)
[facility]    Provider:  Dr Lucia Gaskins Referring Provider: Cain Saupe, MD Primary Care Physician:  Cain Saupe, MD  CC: staggering  Follow up 11/03/2015: Feels like he is going pass out, lightheaded, dizzy when walking.  He is s/p AV replacement on 02/11/2015. PMHx aortic insufficiancy, hld, htn, OSA, [id number], hypothyroidism, copd, afib, CABG,  chronic SOB. He feels lightheaded. He doesn't use a cane even though he should. No falls. He feels lightheaded when he gets up after about 5 minutes then he feels dizzy and lightheaded. He denies room spinning. Feels like he is going to faint or pass out. He has SOB with these events. He has an appointment at 4pm today with cardiology. Happens every day especially when he gets up out of bed in the mornings he has to sit on the side of the bed for a little bit before getting up. His blood pressure goes down when he goes from sitting to standing. He has also been referred to ENT for vertigo.   HPI: [name] [name] is a 82 y.o. male here as a referral from Dr. Jillyn Hidden for abnormal gait, tremor. He is s/p AV replacement on 02/11/2015. Not using his walker. He has not fallen recently. He can't get an MRI done because of metal in his head. When he was falling he was getting lightheaded, he fell 15 times in a year, but feels much better since the operation and no more lightheadedness and no more falls. However he feels dizzy in the last week, no problems urinating, no fevers or chills, he hasn't been drinking water but he has been drinking minute maid lemonade. He has been feeling better about his gait except the last week. No weakness, no paresthesias, no vision changes, no dysarthria. He gets "choked" a little bit. He went to rehab and they gave him tongue exercises. He coughs and never knows why, once every few days, feels like it goes down the wrong way and he chokes and coughs. Been going on 2-3 years. Tremors improved.   Initial visit: He is  walking and staggers. Started 2 months ago. Happens every time he walks, "constant". He is going to have heart surgery and he went to physical therapy and his walking has improved. Symptoms started after he fell and hurt his hip, he has been falling. Doesn't know why he is falling, he gets "woozy" and he needs bypass and has afib. He has had 12 falls. He refuses to use a walking aid. Doesn't know why he falls. Last time he fell was in rehab a month ago. His legs are so weak that if he comes to a "hump, it pushes me over one way". He has bad knees and that is affecting him as well. No low back pain. Has tremors in his hand but none today and says the tremors have gone away after changed medication. He shuffles a litle if he doesn't know it but normally walks pretty fast and goes up and down stairs. He can get out of low seats without his hands. No numbness or tingling or burning. Legs get fatigued but no real weakness. Has neck pain, no shooting pain down the arms and no weakness.   Reviewed notes, labs and imaging from outside physicians, which showed: CT of the head showed atrophy and non-specific white matter changes  Review of Systems: Patient complains of symptoms per HPI as well as the following symptoms: no CP, no fever. Pertinent negatives per HPI. All others negative.  Social History   Social History  . Marital Status: Married    Spouse Name: Claris CheMargaret  . Number of Children: 1  . Years of Education: Master's   Occupational History  . Not on file.   Social History Main Topics  . Smoking status: Former Smoker -- 2.00 packs/day for 22 years    Types: Cigarettes    Quit date: 04/29/1974  . Smokeless tobacco: Never Used  . Alcohol Use: 0.0 oz/week    0 Standard drinks or equivalent per week     Comment: 1 beer per year   . Drug Use: No  . Sexual Activity: Not Currently   Other Topics Concern  . Not on file   Social History Narrative   Lives at home with wife.   Right handed.     Caffeine use: 3 cups coffee per day.   Drinks 4 cups tea per day   Drinks soda very rarely     Family History  Problem Relation Age of Onset  . Heart disease Father   . Rheum arthritis Mother   . Rheum arthritis Father   . Cancer Sister   . Stroke Sister     Past Medical History  Diagnosis Date  . Atrial fibrillation (HCC)   . Hypothyroidism   . Plantar fasciitis   . Seasonal allergies   . Decreased testosterone level   . Varicose veins   . Hypertension   . Hypercholesteremia   . Aortic valve insufficiency   . Depression   . Heart murmur   . DVT (deep venous thrombosis) (HCC) ~ 2013    "BLE"  . OSA (obstructive sleep apnea)     "suppose to wear mask; I throw it off in my sleep" (10/15/2014)  . COPD (chronic obstructive pulmonary disease) (HCC)   . Emphysema of lung (HCC)   . History of blood transfusion 1960    "lots; related to an accident"  . Arthritis     "wee bit; knees, elbows" (10/15/2014)  . Anxiety   . Falls frequently     > 20 times in the last year/notes 10/15/2014  . Syncope and collapse "several times"  . Complication of anesthesia     unsure of complications but pt. states there were complications  . Dysrhythmia     A. Fib  . Coronary artery disease   . Shortness of breath dyspnea   . Chronic kidney disease     "down to ~ 1/2 of their regular use; [place] (10/15/2014)  . Urine frequency   . Urine incontinence   . Enlarged prostate   . GERD (gastroesophageal reflux disease)   . Difficult intubation     difficult intubation 12/03/09 and 03/27/10 ([facility]); glidescope used 03/27/10  . History of echocardiogram     post AVR >> Echo 7/16:  Mild LVH, EF 20-25%, AVR ok (peak 15 mmHg, mean 9 mmHg), MAC, mild MR, severe LAE, mild reduced RVSF, mild RAE, PASP 35 mmHg  . Cardiomyopathy [facility](HCC)     Past Surgical History  Procedure Laterality Date  . Abdominal aortic aneurysm repair  01/2006; 03/10/2006    Hattie Perch/notes 01/12/2011  . Tonsillectomy     . Hernia repair    . Laparoscopic incisional / umbilical / ventral hernia repair  02/2010    VHR w/mesh/notes 03/28/2010  . Laparoscopic cholecystectomy  08/2009    w/IOC/notes 09/18/2009  . Ercp  11/2009    Hattie Perch/notes 12/05/2009  . Gall stone    .  Cardiac catheterization  1990's X 1; 09/2014    no stents  . Nose surgery    . Brain surgery  1960 X 4    "S/P got my skull busted"; pt. states removed internal carotid artery  . Aortic valve replacement N/A 02/11/2015    Procedure: AORTIC VALVE REPLACEMENT (AVR);  Surgeon: [name], MD;  Location: [facility] OR;  Service: Open Heart Surgery;  Laterality: N/A;  . Clipping of atrial appendage N/A 02/11/2015    Procedure: CLIPPING OF ATRIAL APPENDAGE;  Surgeon: [name], MD;  Location: [facility] OR;  Service: Open Heart Surgery;  Laterality: N/A;  . Tee without cardioversion N/A 02/11/2015    Procedure: TRANSESOPHAGEAL ECHOCARDIOGRAM (TEE);  Surgeon: [name], MD;  Location: [facility] OR;  Service: Open Heart Surgery;  Laterality: N/A;    Current Outpatient Prescriptions  Medication Sig Dispense Refill  . albuterol (PROVENTIL HFA;VENTOLIN HFA) 108 (90 BASE) MCG/ACT inhaler Inhale 2 puffs into the lungs every 6 (six) hours as needed for wheezing or shortness of breath.    Marland Kitchen amLODipine (NORVASC) 2.5 MG tablet Take 2 tablets (5 mg total) by mouth daily. 30 tablet 11  . atorvastatin (LIPITOR) 10 MG tablet Take 1 tablet (10 mg total) by mouth daily. 30 tablet 0  . buPROPion (WELLBUTRIN XL) 300 MG 24 hr tablet Take 1 tablet (300 mg total) by mouth daily. 30 tablet 1  . busPIRone (BUSPAR) 15 MG tablet Take 1 tablet (15 mg total) by mouth 2 (two) times daily. 60 tablet 1  . calcium carbonate (TUMS - DOSED IN MG ELEMENTAL CALCIUM) 500 MG chewable tablet Chew 1 tablet by mouth as needed for indigestion or heartburn.    . carvedilol (COREG) 25 MG tablet Take 1 tablet (25 mg total) by mouth 2 (two) times daily. 60 tablet 11  . clonazePAM (KLONOPIN) 0.5 MG tablet  Take 1 tablet (0.5 mg total) by mouth at bedtime. 30 tablet 0  . digoxin (LANOXIN) 0.125 MG tablet Take 125 mcg by mouth daily.  0  . dutasteride (AVODART) 0.5 MG capsule Take 1 capsule (0.5 mg total) by mouth daily. 30 capsule 0  . ELIQUIS 2.5 MG TABS tablet TAKE 1 TABLET BY MOUTH TWICE A DAY 60 tablet 3  . escitalopram (LEXAPRO) 20 MG tablet Take 1 tablet (20 mg total) by mouth daily. 30 tablet 1  . Eszopiclone 3 MG TABS Take 3 mg by mouth at bedtime.  0  . fluticasone (FLONASE) 50 MCG/ACT nasal spray Place 1 spray into both nostrils daily. 9.9 g 2  . folic acid (FOLVITE) 1 MG tablet Take 1 tablet (1 mg total) by mouth daily. 30 tablet 1  . lamoTRIgine (LAMICTAL) 100 MG tablet Take 1 tablet (100 mg total) by mouth 2 (two) times daily. Breakfast and dinner 60 tablet 1  . levothyroxine (SYNTHROID, LEVOTHROID) 175 MCG tablet Take 175 mcg by mouth daily before breakfast.    . Melatonin 3 MG TABS Take 1 tablet by mouth at bedtime.    . traMADol (ULTRAM) 50 MG tablet Take 1 tablet (50 mg total) by mouth every 4 (four) hours as needed for moderate pain. 90 tablet 0   No current facility-administered medications for this visit.    Allergies as of 11/03/2015  . (No Known Allergies)    Vitals: BP 133/85 mmHg  Pulse 69  Ht 6' (1.829 m)  Wt 230 lb 3.2 oz (104.418 kg)  BMI 31.21 kg/m2 Last Weight:  Wt Readings from Last 1 Encounters:  11/03/15 230 lb 3.2 oz (104.418 kg)   Last Height:   Ht Readings from Last 1 Encounters:  11/03/15 6' (1.829 m)     Exam: Gen: NAD, conversant, well nourised, obese, well groomed  Eyes: Conjunctivae clear without exudates or hemorrhage  Neuro: Detailed Neurologic Exam  Speech:  Speech is normal; fluent and spontaneous with normal comprehension.  Cognition:  The patient is oriented to person, place, and time;  Cranial Nerves: proptosis.   The pupils are equal, round, and reactive to light.  Visual fields are full to finger  confrontation. Extraocular movements are intact. Trigeminal sensation is intact and the muscles of mastication are normal. The face is symmetric. The palate elevates in the midline. Hearing impaired . Voice is normal. Shoulder shrug is normal. The tongue has normal motion without fasciculations.   Coordination:  FTN and HTS intact  Gait:  Good stride and arm swing. Not shuffling. Can heel and toe walk. dififculty with tandem.   Motor Observation:  Mild postural tremor with action component  Tone:  Normal muscle tone.   Posture:  Posture is normal.    Strength:  Strength is V/V in the upper and lower limbs.    Sensation: Intact pp Decreased temperature to mid calf in lowers, intact uppers Intact proprioception great toes 6 seconds vibration bilateral great toes Romberg negative   Reflex Exam:  DTR's: Absent achilles otherwise deep tendon reflexes in the upper and lower extremities are brisk bilaterally.  Toes:  right upgoing toe.  Clonus:  Clonus is absent.      Assessment/Plan: 82 year old male with PMHx severe OSA, HTN, hypothyroid, depression, HLD, , glucose intolerance, CRI, pancreatitis, emphysema p/w feeling like he is going pass out, lightheaded, dizzy when walking. He feels lightheaded. He doesn't use a cane even though he should. No falls. He feels lightheaded when he gets up after about 5 minutes then he feels dizzy and lightheaded No parkinsonism. He is not using walking aid, he is supposed to. Highly encouraged him as he is a fall risk. He could not get an MRi of the brain/cervical cord due to metal.  Likely orthostatic.   Neuropathy:  screen in the past was unremarkable (anti-mag, hgba1c, IFE, b12). TSH was very abnormal, was advised to see his pcp last August for elevated tsh (86.87) and is on synthroid.  Dizziness:   CT head and neck PT balance training Needs to use cane, fall risk.  Orthostatics today in office - sitting  115/60 66, sitting 115/75 70, standing 118/80 73 Polypharmacy may be contributory as well as his chronic medical conditions such as  copd, afib and chronic SOB.   Cc: cammie fulp  Naomie Dean, MD  [facility] [address] [place], [place] [id number] [phone] Fax [phone]  A total of 30 minutes was spent face-to-face with this patient. Over half this time was spent on counseling patient on the dizziness diagnosis and different diagnostic and therapeutic options available.

## 2015-11-05 ENCOUNTER — Ambulatory Visit: Payer: Medicare Other | Admitting: Neurology

## 2015-11-07 ENCOUNTER — Encounter: Payer: Self-pay | Admitting: *Deleted

## 2015-11-08 DIAGNOSIS — I951 Orthostatic hypotension: Secondary | ICD-10-CM | POA: Insufficient documentation

## 2015-11-17 ENCOUNTER — Encounter: Payer: Self-pay | Admitting: Physical Therapy

## 2015-11-17 ENCOUNTER — Ambulatory Visit: Payer: Medicare Other | Admitting: Physical Therapy

## 2015-11-17 DIAGNOSIS — R2681 Unsteadiness on feet: Secondary | ICD-10-CM | POA: Diagnosis not present

## 2015-11-17 DIAGNOSIS — R42 Dizziness and giddiness: Secondary | ICD-10-CM

## 2015-11-17 DIAGNOSIS — R269 Unspecified abnormalities of gait and mobility: Secondary | ICD-10-CM | POA: Diagnosis not present

## 2015-11-17 NOTE — Therapy (Addendum)
[facility] [address] [place], [place], [address] Phone: [phone]   Fax:  [phone]  Physical Therapy Evaluation  Patient Details  Name: [name] [name] MRN: [id number] Date of Birth: [birth year] Referring Provider: Naomie Dean, MD  Encounter Date: 11/17/2015      PT End of Session - 11/17/15 2304    Visit Number 1   Number of Visits 9  eval + 8 visits   Date for PT Re-Evaluation 01/16/16   Authorization Type Medicare Traditional Primary; BCBS secondary - G Codes required   PT Start Time 1453   PT Stop Time 1543   PT Time Calculation (min) 50 min   Activity Tolerance Patient tolerated treatment well   Behavior During Therapy [facility] for tasks assessed/performed      Past Medical History  Diagnosis Date  . Atrial fibrillation (HCC)   . Hypothyroidism   . Plantar fasciitis   . Seasonal allergies   . Decreased testosterone level   . Varicose veins   . Hypertension   . Hypercholesteremia   . Aortic valve insufficiency   . Depression   . Heart murmur   . DVT (deep venous thrombosis) (HCC) ~ 2013    "BLE"  . OSA (obstructive sleep apnea)     "suppose to wear mask; I throw it off in my sleep" (10/15/2014)  . COPD (chronic obstructive pulmonary disease) (HCC)   . Emphysema of lung (HCC)   . History of blood transfusion 1960    "lots; related to an accident"  . Arthritis     "wee bit; knees, elbows" (10/15/2014)  . Anxiety   . Falls frequently     > 20 times in the last year/notes 10/15/2014  . Syncope and collapse "several times"  . Complication of anesthesia     unsure of complications but pt. states there were complications  . Dysrhythmia     A. Fib  . Coronary artery disease   . Shortness of breath dyspnea   . Chronic kidney disease     "down to ~ 1/2 of their regular use; I see kidney dr. in [place]" (10/15/2014)  . Urine frequency   . Urine incontinence   . Enlarged prostate   . GERD  (gastroesophageal reflux disease)   . Difficult intubation     difficult intubation 12/03/09 and 03/27/10 ([facility]); glidescope used 03/27/10  . History of echocardiogram     post AVR >> Echo 7/16:  Mild LVH, EF 20-25%, AVR ok (peak 15 mmHg, mean 9 mmHg), MAC, mild MR, severe LAE, mild reduced RVSF, mild RAE, PASP 35 mmHg  . Cardiomyopathy [facility])     Past Surgical History  Procedure Laterality Date  . Abdominal aortic aneurysm repair  01/2006; 03/10/2006    Hattie Perch 01/12/2011  . Tonsillectomy    . Hernia repair    . Laparoscopic incisional / umbilical / ventral hernia repair  02/2010    VHR w/mesh/notes 03/28/2010  . Laparoscopic cholecystectomy  08/2009    w/IOC/notes 09/18/2009  . Ercp  11/2009    Hattie Perch 12/05/2009  . Gall stone    . Cardiac catheterization  1990's X 1; 09/2014    no stents  . Nose surgery    . Brain surgery  1960 X 4    "S/P got my skull busted"; pt. states removed internal carotid artery  . Aortic valve replacement N/A 02/11/2015    Procedure: AORTIC VALVE REPLACEMENT (AVR);  Surgeon: [name], MD;  Location: [facility] OR;  Service: Open Heart Surgery;  Laterality: N/A;  . Clipping of atrial appendage N/A 02/11/2015    Procedure: CLIPPING OF ATRIAL APPENDAGE;  Surgeon: [name], MD;  Location: [facility] OR;  Service: Open Heart Surgery;  Laterality: N/A;  . Tee without cardioversion N/A 02/11/2015    Procedure: TRANSESOPHAGEAL ECHOCARDIOGRAM (TEE);  Surgeon: [name], MD;  Location: [facility] OR;  Service: Open Heart Surgery;  Laterality: N/A;    There were no vitals filed for this visit.  Visit Diagnosis:  Dizziness and giddiness - Plan: PT plan of care cert/re-cert  Abnormality of gait - Plan: PT plan of care cert/re-cert  Unsteadiness - Plan: PT plan of care cert/re-cert      Subjective Assessment - 11/17/15 2306    Pertinent History Pt likes to be called "[name]".   PMH significant for: CAD s/p CABG, aortic insufficiency s/p aortic valve replacement, HLD, HTN,  OSA, AAA, hypothyroidism, COPD, HTN, HLD, brain surgery (aneursym clipping) in 1961, Aortic valve insufficiency,  CKD, orthostatic hypotension, chronic neck pain, mood disorder with psychosis, R fibular fx (09/2014), syncope and collapse.              [facility] PT Assessment - 11/17/15 0001    Assessment   Medical Diagnosis balance disorder   Referring Provider Naomie Dean, MD   Onset Date/Surgical Date 10/28/13   Hand Dominance Right   Precautions   Precautions Fall;Other (comment)   Precaution Comments Metal in brain since brain surgery in 1961.   Restrictions   Weight Bearing Restrictions No   Balance Screen   Has the patient fallen in the past 6 months No   Has the patient had a decrease in activity level because of a fear of falling?  Yes   Is the patient reluctant to leave their home because of a fear of falling?  No   Home Environment   Living Environment Private residence   Living Arrangements Spouse/significant other  wife unable to physically assist patient   Type of Home House   Home Access Level entry   Home Layout Two level   Alternate Level Stairs-Number of Steps 17   Alternate Level Stairs-Rails Right  only for first half of stairs   Home Equipment Walker - 2 wheels;Cane - quad;Shower seat;Toilet riser  owns but does not use toilet riser or shower seat   Prior Function   Level of Independence Independent   Vocation Retired   Leisure Watch TV   Observation/Other Assessments   Activities of Balance Confidence Scale (ABC Scale)  61.3%  < 67% indicates risk for falling   Dizziness Handicap Inventory (DHI)  12  0 = no perceived disability due to dizziness            Vestibular Assessment - 11/17/15 0001    Vestibular Assessment   General Observation Pt describes frequent dizziness, sometimes at rest, but often occurs with movement. Pt describes dizziness as "faint", stating "It feels as though all the blood is running out of my head. Sometimes I see flashing  lights." Orthostatic vitals WNL at Dr. Trevor Mace office. Lightheadedness not accompanied by nausea, dysarthria. Reports intermittent diplopia and dysphagia, but is unsure what causes this. Pt reports onset of lightheadedness and occipital pain with active cervical spine extension, rotation to R. Therefore, Dix-Hallpike was deferred and Roll Test was modified to supine rolling in B directions to avoid cervical spine rotation.   Symptom Behavior   Type of Dizziness Lightheadedness   Frequency of Dizziness multiple times  per day   Duration of Dizziness seconds   Aggravating Factors Mornings;Supine to sit;Sit to stand   Occulomotor Exam   Occulomotor Alignment Abnormal  L eye slightly abducted at rest   Spontaneous Absent   Gaze-induced Left beating nystagmus with L gaze   Smooth Pursuits Intact  slightly saccadic to L of midline   Saccades Dysmetria  hypmetric saccades (L > R eye) in all directions   Comment Convergence impaired in L eye. Head Thrust Test not formally assessed due to neck pain.   Vestibulo-Occular Reflex   VOR 1 Head Only (x 1 viewing) Pt able to perform x1 viewing without slowed head movement or increased symptoms; however, appears to lose target during X1 viewing with horizontal > vertical head movement.   VOR Cancellation Corrective saccades  but asymptomatic   Positional Testing   Sidelying Test Sidelying Right;Sidelying Left   Horizontal Canal Testing Horizontal Canal Right;Horizontal Canal Left  Test modified via rolling in B directions   Sidelying Right   Sidelying Right Duration No nystagmus in room light but pt notes motion sensitivity< 5 sec duration.   Sidelying Right Symptoms No nystagmus   Sidelying Left   Sidelying Left Duration No nystagmus in room light but pt notes motion sensitivity< 5 sec duration   Sidelying Left Symptoms No nystagmus   Horizontal Canal Right   Horizontal Canal Right Duration Vertiginous symptoms approx. 10 seconds duration; however,  unable to visualize nystagmus in room light.   Horizontal Canal Right Symptoms Normal   Horizontal Canal Left   Horizontal Canal Left Duration Transient motion sensivity but no vertiginous symptoms, no nystagmus noted.   Horizontal Canal Left Symptoms Normal               [facility] Adult PT Treatment/Exercise - 11/17/15 0001    Ambulation/Gait   Ambulation/Gait Yes   Ambulation/Gait Assistance 4: Min guard;4: Min assist;6: Modified independent (Device/Increase time)   Ambulation/Gait Assistance Details Grossly mod I for linear gait over level, indoor surfaces without external demands. Pt required min A  to recovery from LOB to L side with horizontal head turns to R then L.   Ambulation Distance (Feet) 135 Feet   Assistive device None   Gait Pattern Step-through pattern;Wide base of support  significant gait instaility with horizonal head turns   Ambulation Surface Level;Indoor   Gait velocity 3.44 ft/sec                  PT Short Term Goals - 11/17/15 2331    PT SHORT TERM GOAL #1   Title STG's = LTG's           PT Long Term Goals - 11/17/15 2332    PT LONG TERM GOAL #1   Title Pt will independently perform HEP to maximize functional gains made in PT.     (Targte date: 12/15/15)   PT LONG TERM GOAL #2   Title Perform FGA and improve score by 8 points from baseline to indicate significant improvement in dynamic gait stability.    PT LONG TERM GOAL #3   Title Perform Sensory Organization Test and to further examine pt ability to utilize multi-sensory input for balance.     PT LONG TERM GOAL #4   Title Write appropriate SOT goal based on findings.   PT LONG TERM GOAL #5   Title Pt will improve ABC score from 61.3% to > 74% to indicate significant improvement in balance confidence.  Plan - 12-03-15 01/15/05    Clinical Impression Statement Pt is an 82 y/o M referred to outpatient PT to address balance impairments. PMH significant for: CAD s/p CABG,  aortic insufficiency s/p aortic valve replacement, HLD, HTN, OSA, AAA, hypothyroidism, COPD, HTN, HLD, brain surgery (aneursym clipping) in Jan 16, 1960, Aortic valve insufficiency, CKD, orthostatic hypotension, chronic neck pain, mood disorder with psychosis, R fibular fx (09/2014), syncope and collapse. PT evaluation revealed the following impairments: impaired VOR cancellation, hypometric saccades, impaired convergence; L gaze-evoked nystagmus; concordant lightheadedness with all transitional movements, onset of vertiginous symptoms (no nystagmus noted) and occipital headache with active cervical spine extension and R rotation; ABC score suggestive of decreased balance confidence. Based on findings, unable to rule out impaired visual-vestibular integration and L-sided vestibular hypofunction. Pt will benefit from skilled outpatient PT 2x/week for 4 weeks to address said impairments.    Pt will benefit from skilled therapeutic intervention in order to improve on the following deficits Abnormal gait;Pain;Decreased balance;Dizziness  Headache and neck pain will be monitored but not directly addressed in PT due to nature of referral.   Rehab Potential Fair   Clinical Impairments Affecting Rehab Potential Complex medical history   PT Frequency 2x / week   PT Duration 4 weeks   PT Treatment/Interventions ADLs/Self Care Home Management;Canalith Repostioning;Vestibular;DME Instruction;Balance training;Therapeutic exercise;Therapeutic activities;Patient/family education;Functional mobility training;Neuromuscular re-education;Stair training;Gait training   PT Next Visit Plan Assess FGA. SOT?  Initiate HEP with emphasis on standing balance, dynamic gait, and habituation due to motion sensitivity with bed mobility.   Consulted and Agree with Plan of Care Patient          G-Codes - 2015/12/03 Jan 15, 2249    Functional Assessment Tool Used ABC score = 61.3%  (100% = complete balance confidence)   Functional Limitation Self care    Self Care Current Status (Z6109) At least 20 percent but less than 40 percent impaired, limited or restricted   Self Care Goal Status (U0454) At least 1 percent but less than 20 percent impaired, limited or restricted       Problem List Patient Active Problem List   Diagnosis Date Noted  . Orthostatic hypotension 11/08/2015  . CKD (chronic kidney disease) 03/18/2015  . Debilitated 02/20/2015  . S/P AVR 02/11/2015  . Ataxia 12/05/2014  . Abnormality of gait 12/05/2014  . Neck pain, chronic 12/05/2014  . Brisk deep tendon reflexes 12/05/2014  . Abnormal carotid duplex scan 10/28/2014  . OSA (obstructive sleep apnea) 10/28/2014  . Mood disorder with psychosis (HCC) 10/28/2014  . Episodes of formed visual hallucinations 10/16/2014  . Syncope 10/15/2014  . Acute on chronic renal failure (HCC) 10/15/2014  . Right fibular fracture 10/15/2014  . Hypoxia 10/15/2014  . CAD (coronary artery disease), native coronary artery 09/29/2014  . Falls frequently 09/29/2014  . Hypertension   . Hypercholesteremia   . Aortic valve insufficiency   . Depression   . Atrial fibrillation, chronic (HCC) 06/20/2014  . Hypothyroidism 06/20/2014  . Cough 04/29/2014  . Shortness of breath 04/29/2014    Jorje Guild, PT, DPT [facility] [address] [place], [place], [address] Phone: [phone]   Fax:  [phone] 2015/12/03, 11:39 PM  Name: [name] [name] MRN: [id number] Date of Birth: 1932/07/15

## 2015-11-19 ENCOUNTER — Other Ambulatory Visit: Payer: Self-pay | Admitting: Neurology

## 2015-11-19 ENCOUNTER — Telehealth: Payer: Self-pay | Admitting: Neurology

## 2015-11-19 NOTE — Telephone Encounter (Signed)
Call patient, needs CTA for vertebrobasilar insufficiency.

## 2015-11-20 ENCOUNTER — Ambulatory Visit
Admission: RE | Admit: 2015-11-20 | Discharge: 2015-11-20 | Disposition: A | Discharge status: Home / Self Care | Payer: Medicare Other | Source: Ambulatory Visit | Attending: Neurology | Admitting: Neurology

## 2015-11-20 ENCOUNTER — Other Ambulatory Visit: Payer: Self-pay

## 2015-11-20 DIAGNOSIS — M542 Cervicalgia: Secondary | ICD-10-CM

## 2015-11-20 DIAGNOSIS — R260 Ataxic gait: Secondary | ICD-10-CM

## 2015-11-20 DIAGNOSIS — R42 Dizziness and giddiness: Secondary | ICD-10-CM | POA: Diagnosis not present

## 2015-11-20 DIAGNOSIS — R2689 Other abnormalities of gait and mobility: Secondary | ICD-10-CM

## 2015-11-20 NOTE — Patient Outreach (Signed)
[facility]) Care Management  11/20/2015  Willette ClusterRobert Helle April 30, 1932 [id number]   Unsuccessful attempt to reach patient for scheduled assessment.  HIPPA appropriate message left requesting call back.  If no response, RN will make another attempt within 5 working days.  Tyler Deisonnie Chloeann Alfred, RN, MSN RN [employer] [facility] [phone] Fax [phone]

## 2015-11-24 ENCOUNTER — Telehealth: Payer: Self-pay | Admitting: Neurology

## 2015-11-24 NOTE — Telephone Encounter (Signed)
Called patient. Left a message to call us back to discuss imaging below. Will try again later as well.   IMPRESSION: 1. No acute intracranial abnormality. Stable noncontrast CT appearance of the brain since 2016 with chronically advanced cerebral white matter and deep gray matter changes most suggestive of small vessel disease. 2. Evidence of generalized intracranial artery dolichoectasia. Chronic distal left ICA aneurysm clip. 3. No acute osseous abnormality identified. Mild for age cervical spine degeneration. No convincing cervical spinal or foraminal stenosis.

## 2015-11-24 NOTE — Telephone Encounter (Signed)
Called patient. Left a message to call us back to discuss imaging below. I will try again later as well.   IMPRESSION: 1. No acute intracranial abnormality. Stable noncontrast CT appearance of the brain since 2016 with chronically advanced cerebral white matter and deep gray matter changes most suggestive of small vessel disease. 2. Evidence of generalized intracranial artery dolichoectasia. Chronic distal left ICA aneurysm clip. 3. No acute osseous abnormality identified. Mild for age cervical spine degeneration. No convincing cervical spinal or foraminal stenosis.

## 2015-11-24 NOTE — Telephone Encounter (Signed)
Spoke to patient. CT of the head stable, nothing acute, he does have advanced chronic cerebral white matter changes likely small vessel disease and generalized dolichoectasia. Discussed with PT, they question vertebrobasilar insufficiency so will discuss getting a CTA with radiology however patient has CKD and will see if he can have contrast.

## 2015-11-26 ENCOUNTER — Other Ambulatory Visit: Payer: Self-pay

## 2015-11-26 DIAGNOSIS — J441 Chronic obstructive pulmonary disease with (acute) exacerbation: Secondary | ICD-10-CM

## 2015-11-26 NOTE — Patient Outreach (Signed)
[facility]) Care Management  11/26/2015  [name] [name] Dec 31, 1931 [id number]   Telephonic assessment today.  Patient reports he is monitoring BP most days, but that he doesn't trust the accuracy of his cuff.  States BP at MD appointments is always 'OK'..  Encouraged patient to take his equipment with him to next appointment  to compare results.  He reports adhering to low salt diet, and reports stable weight around 250 pounds with no edema.  Patient continues to report dizziness and gait problems that cause him to fear falling.  He is currently going for PT, and he had a neurological assessment which reportedly did not find a cause for this.  [facility] CM Care Plan Problem One        Most Recent Value   Care Plan Problem One  Chronic disease management needs related to COPD.   Role Documenting the Problem One  [place] for Problem One  Not Active   [facility] Long Term Goal (31-90 days)  Patient stated goal is prevention of unnecessary hospital admissions for the next 60 days.   [facility] Long Term Goal Start Date  05/13/15   [facility] Long Term Goal Met Date  07/15/15   Interventions for Problem One Long Term Goal  Pt praised for avoiding ED. [No admissions. Reinforced COPD action plan, [facility] availability]   [facility] CM Short Term Goal #1 (0-30 days)  Patient will adher to medication regimen within 30 days.   [facility] CM Short Term Goal #1 Start Date  05/13/15   [facility] CM Short Term Goal #1 Met Date  06/16/15   Interventions for Short Term Goal #1  Reports using med boxes, no problems. [Reports use of med box and pharmacy consult have helped.]   [facility] CM Short Term Goal #2 (0-30 days)  Patient will not suffer a fall for the next 30 days.   [facility] CM Short Term Goal #2 Start Date  05/13/15   [facility] CM Short Term Goal #2 Met Date  07/15/15   Interventions for Short Term Goal #2  No recent falls.  Discussed fall prevention strategies. [Fall prevention instruction provided.  Will mail EMMI.]   [facility] CM Short  Term Goal #3 (0-30 days)  -- [Patient will check and record daily BP.]   [facility] CM Short Term Goal #3 Start Date  06/16/15   [facility] CM Short Term Goal #3 Met Date  09/25/15   Interventions for Short Tern Goal #3  Patient not sure his cuff is accurate.  Encouraged him to take it with him to MD appt. and have them compare results with their equipment.  States he will get a new cuff if it isn't reliable. [Has new BP cuff. Encouraged to check/record daily.]      Assessment: No acute issues reported today.  Plan:  Patient will continue to monitor for and report symptoms of COPD.           Patient will continue to follow low salt dietary guidelines.           RN will follow up in approximately one month.  Candie Mile, RN, MSN [place] [phone] Fax [phone]

## 2015-12-01 ENCOUNTER — Other Ambulatory Visit: Payer: Self-pay | Admitting: Neurology

## 2015-12-01 ENCOUNTER — Ambulatory Visit: Payer: Medicare Other | Attending: Neurology | Admitting: Physical Therapy

## 2015-12-01 DIAGNOSIS — R2681 Unsteadiness on feet: Secondary | ICD-10-CM | POA: Insufficient documentation

## 2015-12-01 DIAGNOSIS — R2689 Other abnormalities of gait and mobility: Secondary | ICD-10-CM | POA: Insufficient documentation

## 2015-12-01 DIAGNOSIS — R42 Dizziness and giddiness: Secondary | ICD-10-CM | POA: Diagnosis not present

## 2015-12-01 DIAGNOSIS — G450 Vertebro-basilar artery syndrome: Secondary | ICD-10-CM

## 2015-12-01 NOTE — Therapy (Addendum)
[name] [address] [place], [place], [address] Phone: [phone]   Fax:  [phone]  Physical Therapy Treatment  Patient Details  Name: [name] [name] MRN: [id number] Date of Birth: 20-Feb-1932 Referring Provider: Naomie Dean, MD  Encounter Date: 12/01/2015      PT End of Session - 12/01/15 1832    Visit Number 2   Number of Visits 9   Date for PT Re-Evaluation 01/16/16   Authorization Type Medicare Traditional Primary; BCBS secondary - G Codes required   PT Start Time 1455   PT Stop Time 1541   PT Time Calculation (min) 46 min   Equipment Utilized During Treatment Other (comment);Gait belt  Balance Master and harness   Activity Tolerance Patient tolerated treatment well;Other (comment)  brief rest break required due to nausea   Behavior During Therapy [facility] for tasks assessed/performed      Past Medical History  Diagnosis Date  . Atrial fibrillation (HCC)   . Hypothyroidism   . Plantar fasciitis   . Seasonal allergies   . Decreased testosterone level   . Varicose veins   . Hypertension   . Hypercholesteremia   . Aortic valve insufficiency   . Depression   . Heart murmur   . DVT (deep venous thrombosis) (HCC) ~ 2013    "BLE"  . OSA (obstructive sleep apnea)     "suppose to wear mask; I throw it off in my sleep" (10/15/2014)  . COPD (chronic obstructive pulmonary disease) (HCC)   . Emphysema of lung (HCC)   . History of blood transfusion 1960    "lots; related to an accident"  . Arthritis     "wee bit; knees, elbows" (10/15/2014)  . Anxiety   . Falls frequently     > 20 times in the last year/notes 10/15/2014  . Syncope and collapse "several times"  . Complication of anesthesia     unsure of complications but pt. states there were complications  . Dysrhythmia     A. Fib  . Coronary artery disease   . Shortness of breath dyspnea   . Chronic kidney disease     "down to ~ 1/2 of their regular  use; I see kidney dr. in [place]" (10/15/2014)  . Urine frequency   . Urine incontinence   . Enlarged prostate   . GERD (gastroesophageal reflux disease)   . Difficult intubation     difficult intubation 12/03/09 and 03/27/10 ([facility]); glidescope used 03/27/10  . History of echocardiogram     post AVR >> Echo 7/16:  Mild LVH, EF 20-25%, AVR ok (peak 15 mmHg, mean 9 mmHg), MAC, mild MR, severe LAE, mild reduced RVSF, mild RAE, PASP 35 mmHg  . Cardiomyopathy [name])     Past Surgical History  Procedure Laterality Date  . Abdominal aortic aneurysm repair  01/2006; 03/10/2006    [name] [name] 01/12/2011  . Tonsillectomy    . Hernia repair    . Laparoscopic incisional / umbilical / ventral hernia repair  02/2010    VHR w/mesh/notes 03/28/2010  . Laparoscopic cholecystectomy  08/2009    w/IOC/notes 09/18/2009  . Ercp  11/2009    [name] [name] 12/05/2009  . Gall stone    . Cardiac catheterization  1990's X 1; 09/2014    no stents  . Nose surgery    . Brain surgery  1960 X 4    "S/P got my skull busted"; pt. states removed internal carotid artery  . Aortic valve replacement N/A 02/11/2015  Procedure: AORTIC VALVE REPLACEMENT (AVR);  Surgeon: [name] OvensEdward B Gerhardt, MD;  [facility];  Service: Open Heart Surgery;  Laterality: N/A;  . Clipping of atrial appendage N/A 02/11/2015    Procedure: CLIPPING OF ATRIAL APPENDAGE;  Surgeon: [name] OvensEdward B Gerhardt, MD;  [facility];  Service: Open Heart Surgery;  Laterality: N/A;  . Tee without cardioversion N/A 02/11/2015    Procedure: TRANSESOPHAGEAL ECHOCARDIOGRAM (TEE);  Surgeon: [name] OvensEdward B Gerhardt, MD;  [facility];  Service: Open Heart Surgery;  Laterality: N/A;    There were no vitals filed for this visit.  Visit Diagnosis:  Dizziness and giddiness  Unsteadiness on feet  Other abnormalities of gait and mobility      Subjective [name] - 12/01/15 1500    Subjective "The doctor called me and said the imaging studies looked good. She said that I need to have  another test done. I said that would be okay." Pt denies falls and significant changes. Pt does report blurring of vision when turning (head or body) during gait.    Pertinent History Pt likes to be called "[name]".   PMH significant for: CAD s/p CABG, aortic insufficiency s/p aortic valve replacement, HLD, HTN, OSA, AAA, hypothyroidism, COPD, HTN, HLD, brain surgery (aneursym clipping) in 1961, Aortic valve insufficiency,  CKD, orthostatic hypotension, chronic neck pain, mood disorder with psychosis, R fibular fx (09/2014), syncope and collapse.     Patient Stated Goals "To feel less dizzy."   Currently in Pain? No/denies            [name] - 12/01/15 0001    Functional Gait  [name]   Gait assessed  Yes   Gait Level Surface Walks 20 ft in less than 7 sec but greater than 5.5 sec, uses assistive device, slower speed, mild gait deviations, or deviates 6-10 in outside of the 12 in walkway width.  5.98 sec   Change in Gait Speed Makes only minor adjustments to walking speed, or accomplishes a change in speed with significant gait deviations, deviates 10-15 in outside the 12 in walkway width, or changes speed but loses balance but is able to recover and continue walking.  LOB with effective self-recovery   Gait with Horizontal Head Turns Performs head turns with moderate changes in gait velocity, slows down, deviates 10-15 in outside 12 in walkway width but recovers, can continue to walk.  LOB to L side   Gait with Vertical Head Turns Performs task with slight change in gait velocity (eg, minor disruption to smooth gait path), deviates 6 - 10 in outside 12 in walkway width or uses assistive device  did not perform superior head turn   Gait and Pivot Turn Pivot turns safely in greater than 3 sec and stops with no loss of balance, or pivot turns safely within 3 sec and stops with mild imbalance, requires small steps to catch balance.  mild imbalance   Step Over Obstacle Is able to  step over one shoe box (4.5 in total height) without changing gait speed. No evidence of imbalance.   Gait with Narrow Base of Support Ambulates less than 4 steps heel to toe or cannot perform without assistance.   Gait with Eyes Closed Walks 20 ft, uses assistive device, slower speed, mild gait deviations, deviates 6-10 in outside 12 in walkway width. Ambulates 20 ft in less than 9 sec but greater than 7 sec.   Ambulating Backwards Walks 20 ft, slow speed, abnormal gait pattern, evidence for imbalance, deviates  10-15 in outside 12 in walkway width.  12.79 sec   Steps Alternating feet, must use rail.   Total Score 14   FGA comment: < 19 indicated increased fall risk            Vestibular [name] - 12/01/15 0001    Balancemaster   Community education officer Comment Composite score = 50% compared to *age/height normative value of 67%. Sensory analysis: vestibular 1% (as compared with *age/height normative value of 50%). Findings suggest significant impairment in ability to use vestibular input for balance.  4 "falls" sustained, 3 during Condition 5 and 1 during Condition 6. Ankle strategy dominant during Condition 5. COG alignment: to R of midline with slight posterior preference. *Note: findings compared with norms for 80 y/o subjects due to limited research on patients subjects > 35 y/o.                 [facility] Adult PT Treatment/Exercise - 12/01/15 0001    Ambulation/Gait   Ambulation/Gait Yes   Ambulation/Gait Assistance 5: Supervision;4: Min guard;4: Min assist   Ambulation/Gait Assistance Details (S) for linear gait; min guard to min A for gait with functional head turns. See FGA for details.   Ambulation Distance (Feet) 300 Feet   Assistive device None   Gait Pattern Step-through pattern;Wide base of support  significant gait instaility with horizonal head turns   Ambulation Surface Level;Indoor         Vestibular Treatment/Exercise -  12/01/15 0001    Vestibular Treatment/Exercise   Vestibular Treatment Provided Gaze   Gaze Exercises X1 Viewing Horizontal   X1 Viewing Horizontal   Foot Position standing with feet shoulder width apart; then seated   Reps 3   Comments attempted x2 trials in standing; however, transitioned to seated x30 seconds due to postural instability when attempting in standing.            Balance Exercises - 12/01/15 1830    Balance Exercises: Standing   Standing Eyes Closed Wide (BOA);Foam/compliant surface;2 reps;30 secs  1 pillow           PT Education - 12/01/15 1818    Education provided Yes   Education Details Findings of FGA, SOT, and implicated fall risk. HEP initiated; see Pt Instructions.    Person(s) Educated Patient   Methods Explanation;Demonstration;Verbal cues;Handout   Comprehension Verbalized understanding;Returned demonstration          PT Short Term Goals - 11/17/15 2331    PT SHORT TERM GOAL #1   Title STG's = LTG's           PT Long Term Goals - 12/01/15 1834    PT LONG TERM GOAL #1   Title Pt will independently perform HEP to maximize functional gains made in PT.     (Targte date: 12/15/15)   Status On-going   PT LONG TERM GOAL #2   Title Perform FGA and improve score by 8 points from baseline to indicate significant improvement in dynamic gait stability.    Baseline 4/3: baseline FGA score = 14/30.   Status On-going   PT LONG TERM GOAL #3   Title Perform Sensory Organization Test and to further examine pt ability to utilize multi-sensory input for balance.     Baseline 4/3: SOT composite score = 50%. Vestibular score = 1%.    Status Achieved   PT LONG TERM GOAL #4   Title Write appropriate SOT goal based on findings.   Baseline See  LTG #6 below.   Status Achieved   PT LONG TERM GOAL #5   Title Pt will improve ABC score from 61.3% to > 74% to indicate significant improvement in balance confidence.   Status On-going   Additional Long Term Goals    Additional Long Term Goals Yes   PT LONG TERM GOAL #6   Title Pt will improve SOT vestibular score from 1% to > / = 25% to indicate increased use of vestibular input for balance.    Status New               Plan - 12/01/15 1836    Clinical Impression Statement Session focused on assessing dynamic gait stability, assessing use of mutli-sensory input for balance, and on initiating HEP. Composite score = 50% compared to age/height normative value of 67%. Sensory analysis: vestibular 1% (as compared with age/height normative value of 50%). Findings suggest significant impairment in ability to use vestibular input for balance.  FGA score of 14/30 suggests fall risk. HEP initiated.   Pt will benefit from skilled therapeutic intervention in order to improve on the following deficits Abnormal gait;Pain;Decreased balance;Dizziness  Headache and neck pain will be monitored but not directly addressed in PT due to nature of referral.   Rehab Potential Fair   Clinical Impairments Affecting Rehab Potential Complex medical history   PT Frequency 2x / week   PT Duration 4 weeks   PT Treatment/Interventions ADLs/Self Care Home Management;Canalith Repostioning;Vestibular;DME Instruction;Balance training;Therapeutic exercise;Therapeutic activities;Patient/family education;Functional mobility training;Neuromuscular re-education;Stair training;Gait training   PT Next Visit Plan Assess current HEP and progress prn. Avoid cervical spine extension.   Consulted and Agree with Plan of Care Patient        Problem List Patient Active Problem List   Diagnosis Date Noted  . Orthostatic hypotension 11/08/2015  . CKD (chronic kidney disease) 03/18/2015  . Debilitated 02/20/2015  . S/P AVR 02/11/2015  . Ataxia 12/05/2014  . Abnormality of gait 12/05/2014  . Neck pain, chronic 12/05/2014  . Brisk deep tendon reflexes 12/05/2014  . Abnormal carotid duplex scan 10/28/2014  . OSA (obstructive sleep apnea)  10/28/2014  . Mood disorder with psychosis (HCC) 10/28/2014  . Episodes of formed visual hallucinations 10/16/2014  . Syncope 10/15/2014  . Acute on chronic renal failure (HCC) 10/15/2014  . Right fibular fracture 10/15/2014  . Hypoxia 10/15/2014  . CAD (coronary artery disease), native coronary artery 09/29/2014  . Falls frequently 09/29/2014  . Hypertension   . Hypercholesteremia   . Aortic valve insufficiency   . Depression   . Atrial fibrillation, chronic (HCC) 06/20/2014  . Hypothyroidism 06/20/2014  . Cough 04/29/2014  . Shortness of breath 04/29/2014   Jorje Guild, PT, DPT [facility] [address] [place], [place], [address] Phone: [phone]   Fax:  [phone] 12/01/2015, 6:39 PM  Name: [name] [name] MRN: [id number] Date of Birth: 1932/04/16

## 2015-12-01 NOTE — Patient Instructions (Signed)
Balance: Eyes Closed - Bilateral (Varied Surfaces)    Stand with your back to a corner with a stable chair in front of you. Stand on 1 pillow, feet shoulder width, close eyes. Maintain balance _30_ seconds. Repeat __4__ times per set, twice per day.  Gaze Stabilization: Sitting    Keeping eyes on target on wall 3-5 feet away, tilt head down very slightly and move head side to side for __30__ seconds.  Do __2__ sessions per day.  1.Target must remain in focus, not blurry, and appear stationary while head is in motion. 2.Perform exercises with small head movements (45 to either side of midline). 3.Increase speed of head motion so long as target is in focus.

## 2015-12-02 ENCOUNTER — Other Ambulatory Visit: Payer: Self-pay | Admitting: Otolaryngology

## 2015-12-03 ENCOUNTER — Ambulatory Visit: Payer: Medicare Other | Admitting: Physical Therapy

## 2015-12-04 ENCOUNTER — Encounter: Payer: Self-pay | Admitting: Physical Therapy

## 2015-12-06 NOTE — Addendum Note (Signed)
Addended by: Jorje GuildHOBBLE, Nhan Qualley A on: 12/06/2015 01:55 PM   Modules accepted: Orders

## 2015-12-08 ENCOUNTER — Ambulatory Visit: Payer: Medicare Other | Admitting: Physical Therapy

## 2015-12-08 DIAGNOSIS — R2681 Unsteadiness on feet: Secondary | ICD-10-CM

## 2015-12-08 DIAGNOSIS — R42 Dizziness and giddiness: Secondary | ICD-10-CM | POA: Diagnosis not present

## 2015-12-08 DIAGNOSIS — R2689 Other abnormalities of gait and mobility: Secondary | ICD-10-CM | POA: Diagnosis not present

## 2015-12-08 NOTE — Patient Instructions (Addendum)
Balance: Eyes Closed - Bilateral (Varied Surfaces)    Stand with your back to a corner with a stable chair in front of you. Stand on 1 pillow, feet shoulder width apart, close eyes. Turn head slowly from right to left 10 times. Do this exercise twice per day.  Gaze Stabilization: Standing Feet Apart    Feet shoulder width apart, keeping eyes on target on wall __5__ feet away, tilt head down very slightly and move head right to left for __30__ seconds.  Do __2__ sessions per day. Repeat using target on pattern background.  1.Target must remain in focus, not blurry, and appear stationary while head is in motion. 2.Perform exercises with small head movements (45 to either side of midline). 3.Increase speed of head motion so long as target is in focus.   Orthostatic Hypotension Orthostatic hypotension is a sudden drop in blood pressure. It happens when you quickly stand up from a seated or lying position. You may feel dizzy or light-headed. This can last for just a few seconds or for up to a few minutes. It is usually not a serious problem. However, if this happens frequently or gets worse, it can be a sign of something more serious. CAUSES  Different things can cause orthostatic hypotension, including:   Loss of body fluids (dehydration).  Medicines that lower blood pressure.  Sudden changes in posture, such as standing up quickly after you have been sitting or lying down.  Taking too much of your medicine. SIGNS AND SYMPTOMS   Light-headedness or dizziness.   Fainting or near-fainting.   A fast heart rate.   Weakness.   Feeling tired (fatigue).  DIAGNOSIS  Your health care provider may do several things to help diagnose your condition and identify the cause. These may include:   Taking a medical history and doing a physical exam.  Checking your blood pressure. Your health care provider will check your blood pressure when you are:  Lying  down.  Sitting.  Standing.  Using tilt table testing. In this test, you lie down on a table that moves from a lying position to a standing position. You will be strapped onto the table. This test monitors your blood pressure and heart rate when you are in different positions. TREATMENT  Treatment will vary depending on the cause. Possible treatments include:   Changing the dosage of your medicines.  Wearing compression stockings on your lower legs.  Standing up slowly after sitting or lying down.  Eating more salt.  Eating frequent, small meals.  In some cases, getting IV fluids.  Taking medicine to enhance fluid retention. HOME CARE INSTRUCTIONS  Only take over-the-counter or prescription medicines as directed by your health care provider.  Follow your health care provider's instructions for changing the dosage of your current medicines.  Do not stop or adjust your medicine on your own.  Stand up slowly after sitting or lying down. This allows your body to adjust to the different position.  Wear compression stockings as directed.  Eat extra salt as directed.  Do not add extra salt to your diet unless directed to by your health care provider.  Eat frequent, small meals.  Avoid standing suddenly after eating.  Avoid hot showers or excessive heat as directed by your health care provider.  Keep all follow-up appointments. SEEK MEDICAL CARE IF:  You continue to feel dizzy or light-headed after standing.  You feel groggy or confused.  You feel cold, clammy, or sick to your stomach (nauseous).  You have blurred vision.  You feel short of breath. SEEK IMMEDIATE MEDICAL CARE IF:   You faint after standing.  You have chest pain.  You have difficulty breathing.   You lose feeling or movement in your arms or legs.   You have slurred speech or difficulty talking, or you are unable to talk.  MAKE SURE YOU:   Understand these instructions.  Will watch  your condition.  Will get help right away if you are not doing well or get worse.   This information is not intended to replace advice given to you by your health care provider. Make sure you discuss any questions you have with your health care provider.   Document Released: 08/06/2002 Document Revised: 08/21/2013 Document Reviewed: 06/08/2013 Elsevier Interactive Patient Education [employer]

## 2015-12-08 NOTE — Therapy (Signed)
[facility] [address] [place], [place], [address] Phone: [phone]   Fax:  [phone]  Physical Therapy Treatment  Patient Details  Name: [name] [name] MRN: [id number] Date of Birth: [birth year] Referring Provider: Sarina Ill, MD  Encounter Date: 12/08/2015      PT End of Session - 12/08/15 1459    Visit Number 3   Number of Visits 9   Date for PT Re-Evaluation 01/16/16   Authorization Type Medicare Traditional Primary; BCBS secondary - G Codes required   PT Start Time 1407   PT Stop Time 1447   PT Time Calculation (min) 40 min   Activity Tolerance Other (comment)  Limited by postural hypotension   Behavior During Therapy [facility] for tasks assessed/performed      Past Medical History  Diagnosis Date  . Atrial fibrillation ([place])   . Hypothyroidism   . Plantar fasciitis   . Seasonal allergies   . Decreased testosterone level   . Varicose veins   . Hypertension   . Hypercholesteremia   . Aortic valve insufficiency   . Depression   . Heart murmur   . DVT (deep venous thrombosis) ([place]) ~ 2013    "BLE"  . OSA (obstructive sleep apnea)     "suppose to wear mask; I throw it off in my sleep" (10/15/2014)  . COPD (chronic obstructive pulmonary disease) ([place])   . Emphysema of lung ([place])   . History of blood transfusion 1960    "lots; related to an accident"  . Arthritis     "wee bit; knees, elbows" (10/15/2014)  . Anxiety   . Falls frequently     > 20 times in the last year/notes 10/15/2014  . Syncope and collapse "several times"  . Complication of anesthesia     unsure of complications but pt. states there were complications  . Dysrhythmia     A. Fib  . Coronary artery disease   . Shortness of breath dyspnea   . Chronic kidney disease     "down to ~ 1/2 of their regular use; I see kidney dr. in [place]" (10/15/2014)  . Urine frequency   . Urine incontinence   . Enlarged prostate   . GERD  (gastroesophageal reflux disease)   . Difficult intubation     difficult intubation 12/03/09 and 03/27/10 ([facility]); glidescope used 03/27/10  . History of echocardiogram     post AVR >> Echo 7/16:  Mild LVH, EF 20-25%, AVR ok (peak 15 mmHg, mean 9 mmHg), MAC, mild MR, severe LAE, mild reduced RVSF, mild RAE, PASP 35 mmHg  . Cardiomyopathy [facility])     Past Surgical History  Procedure Laterality Date  . Abdominal aortic aneurysm repair  01/2006; 03/10/2006    Archie Endo 01/12/2011  . Tonsillectomy    . Hernia repair    . Laparoscopic incisional / umbilical / ventral hernia repair  02/2010    VHR w/mesh/notes 03/28/2010  . Laparoscopic cholecystectomy  08/2009    w/IOC/notes 09/18/2009  . Ercp  11/2009    Archie Endo 12/05/2009  . Gall stone    . Cardiac catheterization  1990's X 1; 09/2014    no stents  . Nose surgery    . Brain surgery  1960 X 4    "S/P got my skull busted"; pt. states removed internal carotid artery  . Aortic valve replacement N/A 02/11/2015    Procedure: AORTIC VALVE REPLACEMENT (AVR);  Surgeon: Grace Isaac, MD;  Location: [place];  Service: Open  Heart Surgery;  Laterality: N/A;  . Clipping of atrial appendage N/A 02/11/2015    Procedure: CLIPPING OF ATRIAL APPENDAGE;  Surgeon: Grace Isaac, MD;  Location: [place];  Service: Open Heart Surgery;  Laterality: N/A;  . Tee without cardioversion N/A 02/11/2015    Procedure: TRANSESOPHAGEAL ECHOCARDIOGRAM (TEE);  Surgeon: Grace Isaac, MD;  Location: [place];  Service: Open Heart Surgery;  Laterality: N/A;    There were no vitals filed for this visit.      Subjective Assessment - 12/08/15 1408    Subjective Pt doing well; reports no falls, no significant changes. When asked about dizziness, pt states, "I have to stand up longer (before dizziness goes away) after I stand up out of bed."   Pertinent History Pt likes to be called "[name]".   PMH significant for: CAD s/p CABG, aortic insufficiency s/p aortic valve replacement, HLD, HTN,  OSA, AAA, hypothyroidism, COPD, HTN, HLD, brain surgery (aneursym clipping) in 1961, Aortic valve insufficiency,  CKD, orthostatic hypotension, chronic neck pain, mood disorder with psychosis, R fibular fx (09/2014), syncope and collapse.     Patient Stated Goals "To feel less dizzy."   Currently in Pain? No/denies                Vestibular Assessment - 12/08/15 0001    Symptom Behavior   Type of Dizziness Lightheadedness   Frequency of Dizziness "everytime I stand up"   Duration of Dizziness seconds   Aggravating Factors Sit to stand;Supine to sit   Orthostatics   BP supine (x 5 minutes) 119/82 mmHg   HR supine (x 5 minutes) 72   BP standing (after 1 minute) 86/64 mmHg  pt reports concordant lightheadness   HR standing (after 1 minute) 78  with supine, LE's elevated BP 137/88, HR 82                 [facility] Adult PT Treatment/Exercise - 12/08/15 0001    Ambulation/Gait   Ambulation/Gait Yes   Ambulation/Gait Assistance 5: Supervision;4: Min guard   Ambulation/Gait Assistance Details Min guard due to LOB (laterally) within 5 steps of standing up form chair.   Ambulation Distance (Feet) 200 Feet   Assistive device None   Gait Pattern Step-through pattern;Wide base of support  significant gait instaility with horizonal head turns   Ambulation Surface Level;Indoor   Self-Care   Self-Care Other Self-Care Comments   Other Self-Care Comments  Discussed strategies for preventing, managing orthostatic hypotension with focus on hydration, postural adjustment, compression garments, and performance of LE therex. Emphasized importance of static standing > 10 seconds prior to initiating ambulation to decrease fall risk. Pt verbalized understanding.         Vestibular Treatment/Exercise - 12/08/15 0001    Vestibular Treatment/Exercise   Vestibular Treatment Provided Gaze   Gaze Exercises X1 Viewing Horizontal   X1 Viewing Horizontal   Foot Position standing; shoulder width    Reps 2   Comments 2 x30 seconds with cueing to decrease amplitude of head movement due to pt-reported diplopia at endrange.            Balance Exercises - 12/08/15 1454    Balance Exercises: Standing   Standing Eyes Closed Wide (BOA);Head turns;Foam/compliant surface;Other reps (comment);30 secs  progressed from static x30 sec > horiz. head turn x10           PT Education - 12/08/15 1452    Education provided Yes   Education Details HEP progressed; see Pt Instructions  for details. Education on management of postural hypotension and strategies for safety with mobility; see Self Care for details.    Person(s) Educated Patient   Methods Explanation;Demonstration;Handout;Verbal cues   Comprehension Verbalized understanding;Returned demonstration          PT Short Term Goals - 11/17/15 2331    PT SHORT TERM GOAL #1   Title STG's = LTG's           PT Long Term Goals - 12/08/15 1507    PT LONG TERM GOAL #1   Title Pt will independently perform HEP to maximize functional gains made in PT.     (Targte date: 12/15/15)   Baseline Met 4/10.   Status Achieved   PT LONG TERM GOAL #2   Title Perform FGA and improve score by 8 points from baseline to indicate significant improvement in dynamic gait stability.    Baseline 4/3: baseline FGA score = 14/30.   Status On-going   PT LONG TERM GOAL #3   Title Perform Sensory Organization Test and to further examine pt ability to utilize multi-sensory input for balance.     Baseline 4/3: SOT composite score = 50%. Vestibular score = 1%.    Status Achieved   PT LONG TERM GOAL #4   Title Write appropriate SOT goal based on findings.   Baseline See LTG #6 below.   Status Achieved   PT LONG TERM GOAL #5   Title Pt will improve ABC score from 61.3% to > 74% to indicate significant improvement in balance confidence.   Status On-going   PT LONG TERM GOAL #6   Title Pt will improve SOT vestibular score from 1% to > / = 25% to indicate  increased use of vestibular input for balance.    Status On-going               Plan - 12/08/15 1501    Clinical Impression Statement Pt arrived to session reporting increased lightheadedness when standing up from chair and getting OOB in morning. Orthostatic vitals: supine BP 118/82, HR 72; standing BP 86/64, HR 78 accompanied by concordant lightheadedness. BP normalized after pt repositioned into supine with LE's elevated. Able to slowly progress from supine > seated with BP WNL and pt asymptomatic with LE therex. Provided education on postural hypotension. Plan to send current note to primary MD. Angus Seller of session focused on progressing HEP.  Pt met initial goal for HEP performance.   Rehab Potential Fair   Clinical Impairments Affecting Rehab Potential Complex medical history   PT Frequency 2x / week   PT Duration 4 weeks   PT Treatment/Interventions ADLs/Self Care Home Management;Canalith Repostioning;Vestibular;DME Instruction;Balance training;Therapeutic exercise;Therapeutic activities;Patient/family education;Functional mobility training;Neuromuscular re-education;Stair training;Gait training   PT Next Visit Plan Ask about lightheadedness with supine > sit and sit > stand. Avoid cervical spine extension. Assess current HEP and progress prn.    Consulted and Agree with Plan of Care Patient      Patient will benefit from skilled therapeutic intervention in order to improve the following deficits and impairments:  Abnormal gait, Pain, Decreased balance, Dizziness (Headache and neck pain will be monitored but not directly addressed in PT due to nature of referral.)  Visit Diagnosis: Dizziness and giddiness  Unsteadiness on feet     Problem List Patient Active Problem List   Diagnosis Date Noted  . Orthostatic hypotension 11/08/2015  . CKD (chronic kidney disease) 03/18/2015  . Debilitated 02/20/2015  . S/P AVR 02/11/2015  . Ataxia 12/05/2014  .  Abnormality of gait  12/05/2014  . Neck pain, chronic 12/05/2014  . Brisk deep tendon reflexes 12/05/2014  . Abnormal carotid duplex scan 10/28/2014  . OSA (obstructive sleep apnea) 10/28/2014  . Mood disorder with psychosis ([place]) 10/28/2014  . Episodes of formed visual hallucinations 10/16/2014  . Syncope 10/15/2014  . Acute on chronic renal failure ([place]) 10/15/2014  . Right fibular fracture 10/15/2014  . Hypoxia 10/15/2014  . CAD (coronary artery disease), native coronary artery 09/29/2014  . Falls frequently 09/29/2014  . Hypertension   . Hypercholesteremia   . Aortic valve insufficiency   . Depression   . Atrial fibrillation, chronic ([place]) 06/20/2014  . Hypothyroidism 06/20/2014  . Cough 04/29/2014  . Shortness of breath 04/29/2014    Billie Ruddy, PT, DPT [facility] [address] [place], [place], [address] Phone: [phone]   Fax:  [phone] 12/08/2015, 3:09 PM  Name: [name] [name] MRN: [id number] Date of Birth: [birth year]

## 2015-12-09 ENCOUNTER — Ambulatory Visit: Payer: Medicare Other | Admitting: Physical Therapy

## 2015-12-09 DIAGNOSIS — R2681 Unsteadiness on feet: Secondary | ICD-10-CM | POA: Diagnosis not present

## 2015-12-09 DIAGNOSIS — R2689 Other abnormalities of gait and mobility: Secondary | ICD-10-CM | POA: Diagnosis not present

## 2015-12-09 DIAGNOSIS — R42 Dizziness and giddiness: Secondary | ICD-10-CM | POA: Diagnosis not present

## 2015-12-09 NOTE — Therapy (Signed)
[facility] [address] [place], [place], [address] Phone: [phone]   Fax:  [phone]  Physical Therapy Treatment  Patient Details  Name: [name] [name] MRN: [id number] Date of Birth: May 23, 1932 Referring Provider: Sarina Ill, MD  Encounter Date: 12/09/2015      PT End of Session - 12/09/15 1838    Visit Number 4   Number of Visits 9   Date for PT Re-Evaluation 01/16/16   Authorization Type Medicare Traditional Primary; BCBS secondary - G Codes required   PT Start Time 1402   PT Stop Time 1445   PT Time Calculation (min) 43 min   Equipment Utilized During Treatment Gait belt   Activity Tolerance Patient limited by fatigue;Other (comment)  Unable to ambulate for > 2 minutes prior to requesting seated rest break   Behavior During Therapy [facility] for tasks assessed/performed      Past Medical History  Diagnosis Date  . Atrial fibrillation ([place])   . Hypothyroidism   . Plantar fasciitis   . Seasonal allergies   . Decreased testosterone level   . Varicose veins   . Hypertension   . Hypercholesteremia   . Aortic valve insufficiency   . Depression   . Heart murmur   . DVT (deep venous thrombosis) ([place]) ~ 2013    "BLE"  . OSA (obstructive sleep apnea)     "suppose to wear mask; I throw it off in my sleep" (10/15/2014)  . COPD (chronic obstructive pulmonary disease) ([place])   . Emphysema of lung ([place])   . History of blood transfusion 1960    "lots; related to an accident"  . Arthritis     "wee bit; knees, elbows" (10/15/2014)  . Anxiety   . Falls frequently     > 20 times in the last year/notes 10/15/2014  . Syncope and collapse "several times"  . Complication of anesthesia     unsure of complications but pt. states there were complications  . Dysrhythmia     A. Fib  . Coronary artery disease   . Shortness of breath dyspnea   . Chronic kidney disease     "down to ~ 1/2 of their regular use; I see kidney  dr. in [place]" (10/15/2014)  . Urine frequency   . Urine incontinence   . Enlarged prostate   . GERD (gastroesophageal reflux disease)   . Difficult intubation     difficult intubation 12/03/09 and 03/27/10 ([facility]); glidescope used 03/27/10  . History of echocardiogram     post AVR >> Echo 7/16:  Mild LVH, EF 20-25%, AVR ok (peak 15 mmHg, mean 9 mmHg), MAC, mild MR, severe LAE, mild reduced RVSF, mild RAE, PASP 35 mmHg  . Cardiomyopathy [facility])     Past Surgical History  Procedure Laterality Date  . Abdominal aortic aneurysm repair  01/2006; 03/10/2006    Archie Endo 01/12/2011  . Tonsillectomy    . Hernia repair    . Laparoscopic incisional / umbilical / ventral hernia repair  02/2010    VHR w/mesh/notes 03/28/2010  . Laparoscopic cholecystectomy  08/2009    w/IOC/notes 09/18/2009  . Ercp  11/2009    Archie Endo 12/05/2009  . Gall stone    . Cardiac catheterization  1990's X 1; 09/2014    no stents  . Nose surgery    . Brain surgery  1960 X 4    "S/P got my skull busted"; pt. states removed internal carotid artery  . Aortic valve replacement N/A 02/11/2015  Procedure: AORTIC VALVE REPLACEMENT (AVR);  Surgeon: Grace Isaac, MD;  Location: [place];  Service: Open Heart Surgery;  Laterality: N/A;  . Clipping of atrial appendage N/A 02/11/2015    Procedure: CLIPPING OF ATRIAL APPENDAGE;  Surgeon: Grace Isaac, MD;  Location: [place];  Service: Open Heart Surgery;  Laterality: N/A;  . Tee without cardioversion N/A 02/11/2015    Procedure: TRANSESOPHAGEAL ECHOCARDIOGRAM (TEE);  Surgeon: Grace Isaac, MD;  Location: [place];  Service: Open Heart Surgery;  Laterality: N/A;    There were no vitals filed for this visit.      Subjective Assessment - 12/09/15 1405    Subjective Pt reports no changes since last seen yesterday. Denies falls. Pt has been drinking more water since yesterday.  "When I got up today, I wasn't as dizzy as I was yesterday."   Pertinent History Pt likes to be called  "[name] [name]".   PMH significant for: CAD s/p CABG, aortic insufficiency s/p aortic valve replacement, HLD, HTN, OSA, AAA, hypothyroidism, COPD, HTN, HLD, brain surgery (aneursym clipping) in 1961, Aortic valve insufficiency,  CKD, orthostatic hypotension, chronic neck pain, mood disorder with psychosis, R fibular fx (09/2014), syncope and collapse.     Patient Stated Goals "To feel less dizzy."   Currently in Pain? No/denies                         [facility] Adult PT Treatment/Exercise - 12/09/15 0001    Ambulation/Gait   Ambulation/Gait Yes   Ambulation/Gait Assistance 5: Supervision;4: Min guard;4: Min assist   Ambulation/Gait Assistance Details Min A to recover LOB with horizontal head turn to R side.   Ambulation Distance (Feet) 760 Feet  210 x3 trials; 3 x75'   Assistive device None   Gait Pattern Step-through pattern;Narrow base of support;Scissoring  LE scissoring with turning and horiz. head turns   Ambulation Surface Level;Indoor   Gait Comments During gait, cueing initially focused on compensatory strategies for impaired VOR (turning eyes and head, then body) during gait. Also focused on visual spotting with head turns during gait. During 90-degree turns, cueing emphasized maintaining wider BOS due to intermittent LE scissoring. During final gait trial (x210') cueing focused on paced, diaphragmatic breathing to increase pt tolerance to ambulation.   Neuro Re-ed    Neuro Re-ed Details  Standing: turning eyes and head then body toward targets in B directions (eye-level), progressing from targets 12' apart x15 reps to performing 90-degree turns toward visual targets on walls in corner x20 reps. Initially required cueing but gave effective return demo.         Vestibular Treatment/Exercise - 12/09/15 0001    Vestibular Treatment/Exercise   Vestibular Treatment Provided Gaze   Gaze Exercises X1 Viewing Horizontal   X1 Viewing Horizontal   Foot Position standing   Reps 1    Comments x30 seconds with effective between-session carryover of technique.            Balance Exercises - 12/09/15 1834    Balance Exercises: Standing   Standing Eyes Closed Wide (BOA);Foam/compliant surface;Other reps (comment);30 secs  x30 seconds static; no head turns 2/2 postural instability   Gait with Head Turns 2 reps;Forward  2 x50' with horizontal head turns           PT Education - 12/09/15 1841    Education provided Yes   Education Details Walking program initiated; see Pt Instructions.    Person(s) Educated Patient   Methods  Explanation;Handout   Comprehension Verbalized understanding;Returned demonstration          PT Short Term Goals - 11/17/15 2331    PT SHORT TERM GOAL #1   Title STG's = LTG's           PT Long Term Goals - 12/08/15 1507    PT LONG TERM GOAL #1   Title Pt will independently perform HEP to maximize functional gains made in PT.     (Targte date: 12/15/15)   Baseline Met 4/10.   Status Achieved   PT LONG TERM GOAL #2   Title Perform FGA and improve score by 8 points from baseline to indicate significant improvement in dynamic gait stability.    Baseline 4/3: baseline FGA score = 14/30.   Status On-going   PT LONG TERM GOAL #3   Title Perform Sensory Organization Test and to further examine pt ability to utilize multi-sensory input for balance.     Baseline 4/3: SOT composite score = 50%. Vestibular score = 1%.    Status Achieved   PT LONG TERM GOAL #4   Title Write appropriate SOT goal based on findings.   Baseline See LTG #6 below.   Status Achieved   PT LONG TERM GOAL #5   Title Pt will improve ABC score from 61.3% to > 74% to indicate significant improvement in balance confidence.   Status On-going   PT LONG TERM GOAL #6   Title Pt will improve SOT vestibular score from 1% to > / = 25% to indicate increased use of vestibular input for balance.    Status On-going               Plan - 12/09/15 1841    Clinical  Impression Statement Session focused on gait training with emphasis on educating pt on compensatory strategies for vestibular impairments during gait. Pt exhibited effective within-session carryover of strategies. Pt tolerance to ambulation limited to approx. 2 consecutive minutes prior to requesting rest break. Vital signs WNL. Initiated walking program. Continue per POC.   Rehab Potential Fair   Clinical Impairments Affecting Rehab Potential Complex medical history   PT Frequency 2x / week   PT Duration 4 weeks   PT Treatment/Interventions ADLs/Self Care Home Management;Canalith Repostioning;Vestibular;DME Instruction;Balance training;Therapeutic exercise;Therapeutic activities;Patient/family education;Functional mobility training;Neuromuscular re-education;Stair training;Gait training   PT Next Visit Plan Check CTA results. Ask pt about walking program. Assess outdoor mobility.    Consulted and Agree with Plan of Care Patient      Patient will benefit from skilled therapeutic intervention in order to improve the following deficits and impairments:  Abnormal gait, Pain, Decreased balance, Dizziness  Visit Diagnosis: Dizziness and giddiness  Unsteadiness on feet  Other abnormalities of gait and mobility     Problem List Patient Active Problem List   Diagnosis Date Noted  . Orthostatic hypotension 11/08/2015  . CKD (chronic kidney disease) 03/18/2015  . Debilitated 02/20/2015  . S/P AVR 02/11/2015  . Ataxia 12/05/2014  . Abnormality of gait 12/05/2014  . Neck pain, chronic 12/05/2014  . Brisk deep tendon reflexes 12/05/2014  . Abnormal carotid duplex scan 10/28/2014  . OSA (obstructive sleep apnea) 10/28/2014  . Mood disorder with psychosis ([place]) 10/28/2014  . Episodes of formed visual hallucinations 10/16/2014  . Syncope 10/15/2014  . Acute on chronic renal failure ([place]) 10/15/2014  . Right fibular fracture 10/15/2014  . Hypoxia 10/15/2014  . CAD (coronary artery disease),  native coronary artery 09/29/2014  . Falls frequently 09/29/2014  .  Hypertension   . Hypercholesteremia   . Aortic valve insufficiency   . Depression   . Atrial fibrillation, chronic ([place]) 06/20/2014  . Hypothyroidism 06/20/2014  . Cough 04/29/2014  . Shortness of breath 04/29/2014    Billie Ruddy, PT, DPT [facility] [address] [place], [place], [address] Phone: [phone]   Fax:  [phone] 12/09/2015, 6:44 PM  Name: [name] [name] MRN: [id number] Date of Birth: Jan 06, 1932

## 2015-12-09 NOTE — Patient Instructions (Addendum)
Balance: Eyes Closed - Bilateral (Varied Surfaces)    Stand with your back to a corner with a stable chair in front of you. Stand on 1 pillow, feet shoulder width apart, close eyes. Hold for 30 seconds. Perform 4 times, twice per day. Do this exercise twice per day.  Gaze Stabilization: Standing Feet Apart    Feet shoulder width apart, keeping eyes on target on wall __5__ feet away, tilt head down very slightly and move head right to left for __30__ seconds.  Do __2__ sessions per day. Repeat using target on pattern background.  1.Target must remain in focus, not blurry, and appear stationary while head is in motion. 2.Perform exercises with small head movements (45 to either side of midline). 3.Increase speed of head motion so long as target is in focus.  Walking Program:  Begin walking for exercise for 2 minutes, 2-3 times/day, 5 days/week.   Progress your walking program by adding 1 minute to your routine each week, as tolerated. Be sure to wear good walking shoes, walk in a safe environment and only progress to your tolerance. If you walk outside, use your cane. Don't forget to breathe (in through nose, out through pursed lips) the entire time you're walking.

## 2015-12-10 ENCOUNTER — Inpatient Hospital Stay: Admission: RE | Admit: 2015-12-10 | Payer: Self-pay | Source: Ambulatory Visit

## 2015-12-16 ENCOUNTER — Ambulatory Visit: Payer: Medicare Other | Admitting: Physical Therapy

## 2015-12-18 ENCOUNTER — Ambulatory Visit: Payer: Medicare Other | Admitting: Physical Therapy

## 2015-12-18 ENCOUNTER — Ambulatory Visit
Admission: RE | Admit: 2015-12-18 | Discharge: 2015-12-18 | Disposition: A | Discharge status: Home / Self Care | Payer: Medicare Other | Source: Ambulatory Visit | Attending: Neurology | Admitting: Neurology

## 2015-12-18 DIAGNOSIS — G450 Vertebro-basilar artery syndrome: Secondary | ICD-10-CM

## 2015-12-18 DIAGNOSIS — I6523 Occlusion and stenosis of bilateral carotid arteries: Secondary | ICD-10-CM | POA: Diagnosis not present

## 2015-12-18 MED ORDER — IOPAMIDOL (ISOVUE-370) INJECTION 76%
75.0000 mL | Freq: Once | INTRAVENOUS | Status: AC | PRN
  Administered 2015-12-18: 75 mL via INTRAVENOUS

## 2015-12-22 ENCOUNTER — Telehealth: Payer: Self-pay | Admitting: *Deleted

## 2015-12-22 NOTE — Telephone Encounter (Signed)
-----   Message from Anson FretAntonia B Ahern, MD sent at 12/20/2015 12:28 PM EDT ----- Kara MeadEmma, CTA was done to see if he had any stenosis in the posterior circulation to account for his dizziness which he does not. He does have a lot of atherosclerosis, but this is chronic.  Thanks!

## 2015-12-22 NOTE — Telephone Encounter (Signed)
Called and spoke to pt about CTA results per Dr Lucia GaskinsAhern note. Pt verbalized understanding. Advised him to continue to get daily exercise, manage BP and cholesterol. PCP helps manage these.

## 2015-12-23 ENCOUNTER — Ambulatory Visit: Payer: Medicare Other | Admitting: Physical Therapy

## 2015-12-24 ENCOUNTER — Other Ambulatory Visit: Payer: Self-pay

## 2015-12-24 VITALS — Wt 250.0 lb

## 2015-12-24 DIAGNOSIS — J441 Chronic obstructive pulmonary disease with (acute) exacerbation: Secondary | ICD-10-CM

## 2015-12-24 NOTE — Patient Outreach (Signed)
[facility] Care Management  12/24/2015  Willette ClusterRobert Duren 04-Sep-1931 [id number]   Telephonic assessment today with [name].  He reports he has the 'flu'-  A sore throat and a cough with whitish sputum production.  Instructed him to contact PCP for fever, discolored sputum, or worsening symptoms.  Patient reports he is checking his BP daily and that it was 121/87 today.  His weight remains around 250 pounds.  Patient states he is trying to limit salt intake.  Reinforced need to read labels.  Patient reports his gait remains unsteady.  He is going for PT.  He states he has not had a fall recently.  RN will follow up with patient in approximately one month.  Tyler Deisonnie Jalene Demo, RN, MSN RN [employer] [phone] Fax [phone]

## 2015-12-25 ENCOUNTER — Ambulatory Visit: Payer: Medicare Other | Admitting: Physical Therapy

## 2016-01-01 ENCOUNTER — Ambulatory Visit: Payer: Medicare Other | Admitting: Physical Therapy

## 2016-01-05 ENCOUNTER — Ambulatory Visit: Payer: Medicare Other | Attending: Neurology | Admitting: Physical Therapy

## 2016-01-05 DIAGNOSIS — R42 Dizziness and giddiness: Secondary | ICD-10-CM | POA: Insufficient documentation

## 2016-01-05 DIAGNOSIS — R2681 Unsteadiness on feet: Secondary | ICD-10-CM | POA: Insufficient documentation

## 2016-01-05 NOTE — Therapy (Signed)
[facility] [address] [place], [place], [address] Phone: [phone]   Fax:  [phone]  Physical Therapy Treatment  Patient Details  Name: [name] [name] MRN: [id number] Date of Birth: 01-22-1932 Referring Provider: Sarina Ill, MD  Encounter Date: 01/05/2016      PT End of Session - 01/06/16 0832    Visit Number 5   Number of Visits 9   Date for PT Re-Evaluation 01/16/16   Authorization Type Medicare Traditional Primary; BCBS secondary - G Codes required   PT Start Time 9563   PT Stop Time 1629   PT Time Calculation (min) 56 min      Past Medical History  Diagnosis Date  . Atrial fibrillation ([place])   . Hypothyroidism   . Plantar fasciitis   . Seasonal allergies   . Decreased testosterone level   . Varicose veins   . Hypertension   . Hypercholesteremia   . Aortic valve insufficiency   . Depression   . Heart murmur   . DVT (deep venous thrombosis) ([place]) ~ 2013    "BLE"  . OSA (obstructive sleep apnea)     "suppose to wear mask; I throw it off in my sleep" (10/15/2014)  . COPD (chronic obstructive pulmonary disease) ([place])   . Emphysema of lung ([place])   . History of blood transfusion 1960    "lots; related to an accident"  . Arthritis     "wee bit; knees, elbows" (10/15/2014)  . Anxiety   . Falls frequently     > 20 times in the last year/notes 10/15/2014  . Syncope and collapse "several times"  . Complication of anesthesia     unsure of complications but pt. states there were complications  . Dysrhythmia     A. Fib  . Coronary artery disease   . Shortness of breath dyspnea   . Chronic kidney disease     "down to ~ 1/2 of their regular use; I see kidney dr. in [place]" (10/15/2014)  . Urine frequency   . Urine incontinence   . Enlarged prostate   . GERD (gastroesophageal reflux disease)   . Difficult intubation     difficult intubation 12/03/09 and 03/27/10 ([facility]); glidescope used 03/27/10  .  History of echocardiogram     post AVR >> Echo 7/16:  Mild LVH, EF 20-25%, AVR ok (peak 15 mmHg, mean 9 mmHg), MAC, mild MR, severe LAE, mild reduced RVSF, mild RAE, PASP 35 mmHg  . Cardiomyopathy [facility])     Past Surgical History  Procedure Laterality Date  . Abdominal aortic aneurysm repair  01/2006; 03/10/2006    Archie Endo 01/12/2011  . Tonsillectomy    . Hernia repair    . Laparoscopic incisional / umbilical / ventral hernia repair  02/2010    VHR w/mesh/notes 03/28/2010  . Laparoscopic cholecystectomy  08/2009    w/IOC/notes 09/18/2009  . Ercp  11/2009    Archie Endo 12/05/2009  . Gall stone    . Cardiac catheterization  1990's X 1; 09/2014    no stents  . Nose surgery    . Brain surgery  1960 X 4    "S/P got my skull busted"; pt. states removed internal carotid artery  . Aortic valve replacement N/A 02/11/2015    Procedure: AORTIC VALVE REPLACEMENT (AVR);  Surgeon: Grace Isaac, MD;  Location: [facility];  Service: Open Heart Surgery;  Laterality: N/A;  . Clipping of atrial appendage N/A 02/11/2015    Procedure: CLIPPING OF ATRIAL  APPENDAGE;  Surgeon: Grace Isaac, MD;  Location: [facility];  Service: Open Heart Surgery;  Laterality: N/A;  . Tee without cardioversion N/A 02/11/2015    Procedure: TRANSESOPHAGEAL ECHOCARDIOGRAM (TEE);  Surgeon: Grace Isaac, MD;  Location: [facility];  Service: Open Heart Surgery;  Laterality: N/A;    There were no vitals filed for this visit.      Subjective Assessment - 01/06/16 0828    Subjective Pt reports increased dizziness over the weekend and today - "I've been sitting out there a little while and I feel it walking back here"   Pertinent History Pt likes to be called "[name]".   PMH significant for: CAD s/p CABG, aortic insufficiency s/p aortic valve replacement, HLD, HTN, OSA, AAA, hypothyroidism, COPD, HTN, HLD, brain surgery (aneursym clipping) in 1961, Aortic valve insufficiency,  CKD, orthostatic hypotension, chronic neck pain, mood disorder with  psychosis, R fibular fx (09/2014), syncope and collapse.     Patient Stated Goals "To feel less dizzy."   Currently in Pain? No/denies                Vestibular Assessment - 01/06/16 0001    Orthostatics   BP supine (x 5 minutes) 117/95 mmHg  106/83 5 mins later in supine position - HR 79;   HR supine (x 5 minutes) 86   BP sitting 106/79 mmHg   HR sitting 81   BP standing (after 1 minute) 92/62 mmHg   HR standing (after 1 minute) 92   BP standing (after 3 minutes) 96/64 mmHg   HR standing (after 3 minutes) 78   Orthostatics Comment pt reported no symptoms with sit to stand and not in standing; did c/o dizziness with supine to sit  return to seated position 114/83; HR 76     NeuroRe-ed; Positional testing for vertigo - sit to R and L sidelying tests; pt reports no vertigo in R sidelying but did report Some vertigo of short duration in L sidelying position - moderate to severe c/o vertigo with return to sitting position from sidelying  L Dix-Hallpike test - negative for nystagmus and c/o vertigo in test position, but pt did report dizziness with return to upright position   Pt transferred sit to supine with head on R side end of mat and then on L side end of mat - pt did report some slight dizziness when lying supine With sit to L side - no nystagmus observed Pt rolled supine to R side - no c/o vertigo; then rolled from R side to L side - no c/o vertigo Pt reports that he is able to turn/move his head to alleviate the vertigo when this occurs at home -(after lying supine)   Symptoms appear to be of cardiac/ orthostatic hypotension etiology and c/o dizziness worse with initial supine or sidelying to sit and with sit to stand - Symptoms also appear to be worse with initial ambulation, following sit to stand transfer                 PT Short Term Goals - 11/17/15 2331    PT SHORT TERM GOAL #1   Title STG's = LTG's           PT Long Term Goals - 12/08/15 1507     PT LONG TERM GOAL #1   Title Pt will independently perform HEP to maximize functional gains made in PT.     (Targte date: 12/15/15)   Baseline Met 4/10.   Status  Achieved   PT LONG TERM GOAL #2   Title Perform FGA and improve score by 8 points from baseline to indicate significant improvement in dynamic gait stability.    Baseline 4/3: baseline FGA score = 14/30.   Status On-going   PT LONG TERM GOAL #3   Title Perform Sensory Organization Test and to further examine pt ability to utilize multi-sensory input for balance.     Baseline 4/3: SOT composite score = 50%. Vestibular score = 1%.    Status Achieved   PT LONG TERM GOAL #4   Title Write appropriate SOT goal based on findings.   Baseline See LTG #6 below.   Status Achieved   PT LONG TERM GOAL #5   Title Pt will improve ABC score from 61.3% to > 74% to indicate significant improvement in balance confidence.   Status On-going   PT LONG TERM GOAL #6   Title Pt will improve SOT vestibular score from 1% to > / = 25% to indicate increased use of vestibular input for balance.    Status On-going               Plan - 01/06/16 9476    Clinical Impression Statement Pt's symptoms appear to be of cardiac/orthostatic hypotension etiology as well as multifactorial as pt has peripheral neuropathy and h/o old TBI approx. 50 years ago; pt has unsteady gait but is not using an assistive device - would benefit from use of RW to increase safety with gait and decrease fall risk.  Pt reports some vertigo with lying supine but states he is able to move his head and it resolves - Dix-Hallpike test (-) for nystagmus and c/o vertigo in test position but pt did c/o vertigo with return to upright position                                                                                                                                        Rehab Potential Fair   Clinical Impairments Affecting Rehab Potential Complex medical history   PT Frequency 2x /  week   PT Duration 4 weeks   PT Treatment/Interventions ADLs/Self Care Home Management;Canalith Repostioning;Vestibular;DME Instruction;Balance training;Therapeutic exercise;Therapeutic activities;Patient/family education;Functional mobility training;Neuromuscular re-education;Stair training;Gait training   PT Next Visit Plan Check CTA results. Ask pt about walking program. Assess outdoor mobility.   decide cont vs. D/C -- SD   Consulted and Agree with Plan of Care Patient      Patient will benefit from skilled therapeutic intervention in order to improve the following deficits and impairments:  Abnormal gait, Pain, Decreased balance, Dizziness  Visit Diagnosis: Dizziness and giddiness  Unsteadiness on feet     Problem List Patient Active Problem List   Diagnosis Date Noted  . Orthostatic hypotension 11/08/2015  . CKD (chronic kidney disease) 03/18/2015  . Debilitated 02/20/2015  . S/P AVR 02/11/2015  . Ataxia 12/05/2014  .  Abnormality of gait 12/05/2014  . Neck pain, chronic 12/05/2014  . Brisk deep tendon reflexes 12/05/2014  . Abnormal carotid duplex scan 10/28/2014  . OSA (obstructive sleep apnea) 10/28/2014  . Mood disorder with psychosis ([place]) 10/28/2014  . Episodes of formed visual hallucinations 10/16/2014  . Syncope 10/15/2014  . Acute on chronic renal failure ([place]) 10/15/2014  . Right fibular fracture 10/15/2014  . Hypoxia 10/15/2014  . CAD (coronary artery disease), native coronary artery 09/29/2014  . Falls frequently 09/29/2014  . Hypertension   . Hypercholesteremia   . Aortic valve insufficiency   . Depression   . Atrial fibrillation, chronic ([place]) 06/20/2014  . Hypothyroidism 06/20/2014  . Cough 04/29/2014  . Shortness of breath 04/29/2014    Alda Lea, PT 01/06/2016, 8:55 AM  [facility] [address] [place], [place], [address] Phone: [phone]   Fax:  [phone]  Name:  [name] [name] MRN: [id number] Date of Birth: [birth year]

## 2016-01-06 ENCOUNTER — Encounter: Payer: Self-pay | Admitting: Physical Therapy

## 2016-01-19 DIAGNOSIS — F411 Generalized anxiety disorder: Secondary | ICD-10-CM | POA: Diagnosis not present

## 2016-01-19 DIAGNOSIS — F332 Major depressive disorder, recurrent severe without psychotic features: Secondary | ICD-10-CM | POA: Diagnosis not present

## 2016-01-19 DIAGNOSIS — F39 Unspecified mood [affective] disorder: Secondary | ICD-10-CM | POA: Diagnosis not present

## 2016-01-21 ENCOUNTER — Other Ambulatory Visit: Payer: Self-pay

## 2016-01-21 NOTE — Patient Outreach (Signed)
[facility] Care Management  01/21/2016  Willette ClusterRobert Zurn 1932/08/21 [id number]   Unsuccessful attempt to reach patient by phone.  HIPPA appropriate message left requesting call back. If no response, RN will make another attempt within one week.  Tyler Deisonnie Sharla Tankard, RN, MSN RN [employer] Coach [facility] [phone] Fax [phone]

## 2016-01-26 ENCOUNTER — Other Ambulatory Visit: Payer: Self-pay | Admitting: Cardiovascular Disease

## 2016-01-27 ENCOUNTER — Other Ambulatory Visit: Payer: Self-pay

## 2016-01-27 DIAGNOSIS — J441 Chronic obstructive pulmonary disease with (acute) exacerbation: Secondary | ICD-10-CM

## 2016-01-27 NOTE — Patient Outreach (Signed)
[facility] ([facility]) Care Management  01/27/2016  [name] [name] 12/12/1931 [id number]  Telephonic assessment with patient.  He reports he feels he is stable at this time.  He has had no recent acute episodes of chronic illness.  Patient states he is doing BP checks, weights, and adhering to low sodium diet.  He recently received PT for a balance problem, and has had no recent falls. He states he is walking on a regular basis.  Plan:  Discharge patient from Disease Management services as goals are met and at patient's request.           Send discharge letter to PCP.            Connie Rankin, RN, MSN RN Health Coach [facility] [phone] Fax [phone]   

## 2016-01-28 ENCOUNTER — Ambulatory Visit: Payer: Self-pay

## 2016-01-29 DIAGNOSIS — M79672 Pain in left foot: Secondary | ICD-10-CM | POA: Diagnosis not present

## 2016-01-29 DIAGNOSIS — S91209S Unspecified open wound of unspecified toe(s) with damage to nail, sequela: Secondary | ICD-10-CM | POA: Diagnosis not present

## 2016-06-03 DIAGNOSIS — M25562 Pain in left knee: Secondary | ICD-10-CM | POA: Diagnosis not present

## 2016-06-03 DIAGNOSIS — R0609 Other forms of dyspnea: Secondary | ICD-10-CM | POA: Diagnosis not present

## 2016-06-03 DIAGNOSIS — Z23 Encounter for immunization: Secondary | ICD-10-CM | POA: Diagnosis not present

## 2016-06-03 DIAGNOSIS — N644 Mastodynia: Secondary | ICD-10-CM | POA: Diagnosis not present

## 2016-06-03 DIAGNOSIS — R600 Localized edema: Secondary | ICD-10-CM | POA: Diagnosis not present

## 2016-06-03 DIAGNOSIS — R05 Cough: Secondary | ICD-10-CM | POA: Diagnosis not present

## 2016-06-03 DIAGNOSIS — R911 Solitary pulmonary nodule: Secondary | ICD-10-CM | POA: Diagnosis not present

## 2016-06-06 IMAGING — CT CT ANGIO HEAD
1 of 9 series · 11 of 47 positions shown · IV contrast (75CC ISOVUE 370)
Comparison: CT head 11/20/2015

CLINICAL DATA: Vertigo. Vertebrobasilar insufficiency. Traumatic
brain injury 1960s with surgery

Creatinine was obtained on site at [HOSPITAL] at [REDACTED].Results: Creatinine 1.6 mg/dL.
EXAM:
CT ANGIOGRAPHY HEAD
TECHNIQUE: Multidetector CT imaging of the head was performed using the
standard protocol during bolus administration of intravenous
contrast. Multiplanar CT image reconstructions and MIPs were
obtained to evaluate the vascular anatomy.
CONTRAST:  75 mL Isovue 370 IV

[Series 605: axial thin · axial · 0.40mm/px · z∈[+266,+398]mm · 11 of 160 slices shown]
[im 14/160  brain]
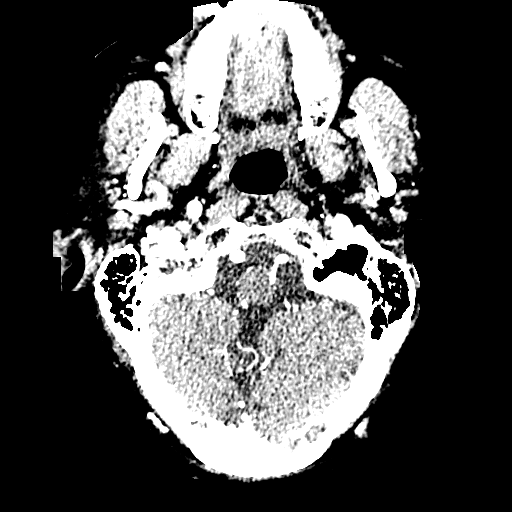
[im 27/160  bone]
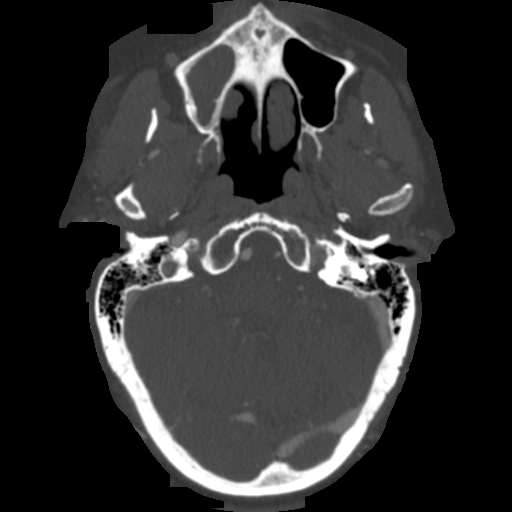
[im 40/160  brain]
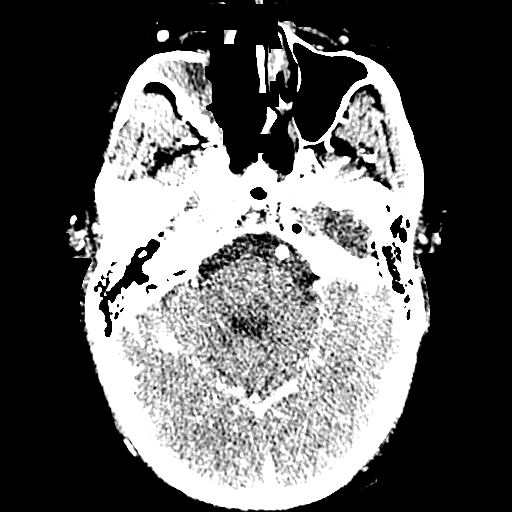
[im 54/160  bone]
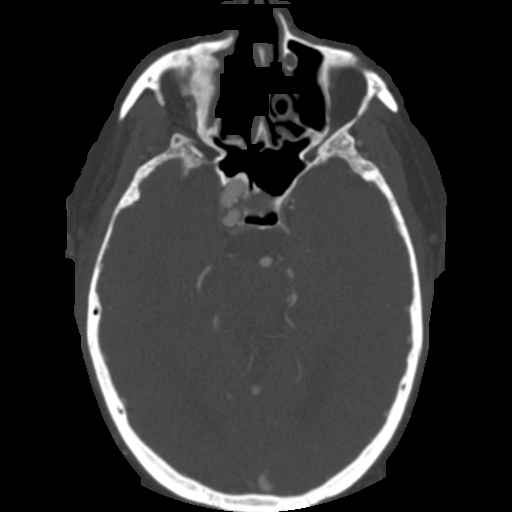
[im 67/160  brain]
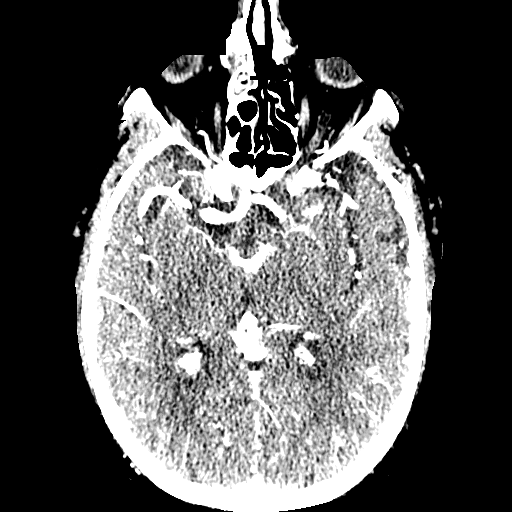
[im 80/160  bone]
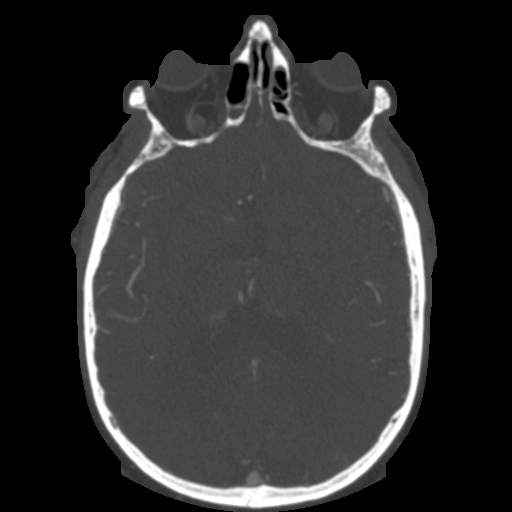
[im 93/160  brain]
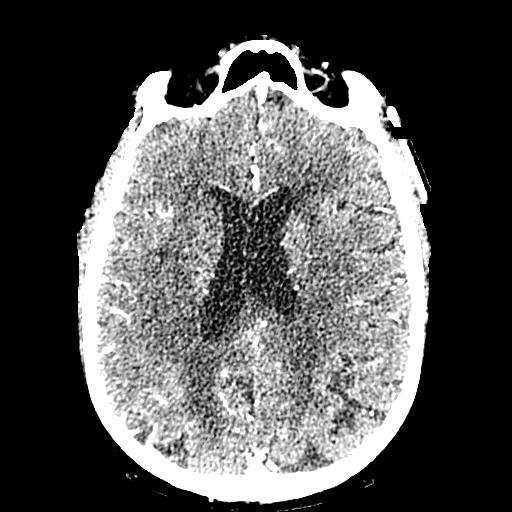
[im 107/160  bone]
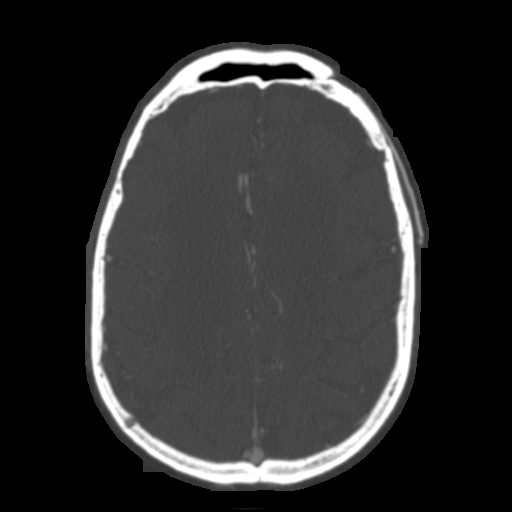
[im 120/160  brain]
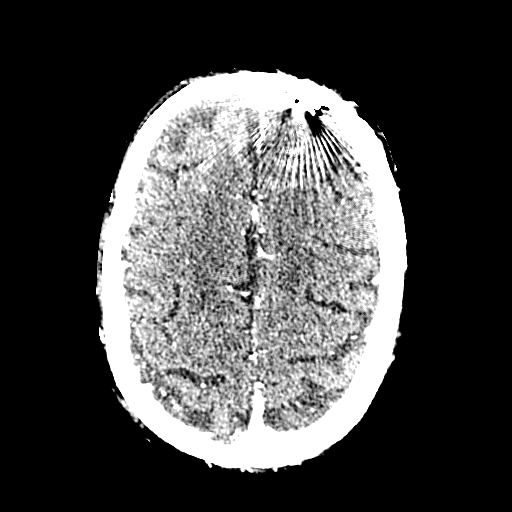
[im 133/160  bone]
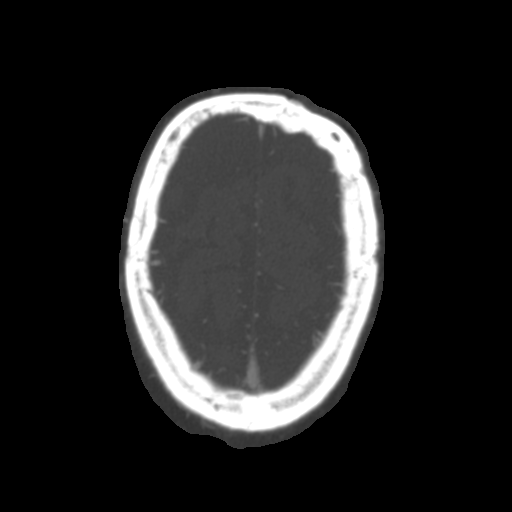
[im 146/160  brain]
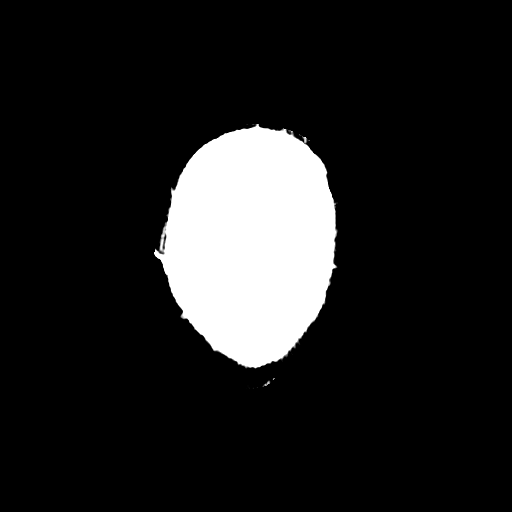

[11 of 47 positions shown; findings below may reference images not displayed]

FINDINGS: CT HEAD

Brain: Moderate atrophy. Extensive chronic microvascular ischemic
change throughout the white matter. Negative for acute infarct. No
acute hemorrhage or mass. Surgical clips in the region of the left
cavernous carotid. Prior left frontal craniotomy with surgical clips
inside the left frontal bone. These are unchanged from the prior
study.

Calvarium and skull base: Negative

Paranasal sinuses: Prior sinus surgery on the right with mucosal and
bony thickening in the right maxillary sinus. Left maxillary sinus
clear. Mucosal edema in the right ethmoid sinus. Mastoid sinus clear
bilaterally.

Orbits: Negative

CTA HEAD

Anterior circulation: Left internal carotid artery is occluded
through the cavernous segment. There are surgical clips in the
region of the left supraclinoid internal carotid artery which
apparently has been clipped in the past. Left anterior and middle
cerebral arteries are patent and supplied from the anterior
communicating artery as well as a small left posterior communicating
artery. No significant stenosis left anterior or middle cerebral
arteries

Right cavernous carotid has mild atherosclerotic disease and is
widely patent. The lumen is enlarged and supplies both hemispheres.
The right A1 segment is enlarged and supplies the left hemisphere
through the anterior communicating artery. Both anterior and middle
cerebral arteries are patent without significant stenosis or
aneurysm.

Posterior circulation: Both vertebral arteries contribute to the
basilar. Right vertebral artery is dominant. PICA patent
bilaterally. Basilar is patent. Superior cerebellar and posterior
cerebral arteries patent bilaterally. Posterior communicating artery
patent bilaterally. No aneurysm in the posterior fossa.

Venous sinuses: Patent.

Anatomic variants: Negative

Delayed phase:Normal enhancement on delayed imaging. No enhancing
mass lesion.
IMPRESSION: Atrophy and extensive chronic microvascular ischemia. No acute
infarct or hemorrhage

Chronic sinusitis on the right

Left internal carotid artery is occluded and appears to have been
surgically clipped.

Right internal carotid artery has mild atherosclerotic disease but
is widely patent. Right cavernous carotid and right A1 segment are
hypertrophied and supply the left cerebral hemisphere through the
anterior communicating artery.

## 2016-06-29 DIAGNOSIS — N183 Chronic kidney disease, stage 3 (moderate): Secondary | ICD-10-CM | POA: Diagnosis not present

## 2016-06-29 DIAGNOSIS — I10 Essential (primary) hypertension: Secondary | ICD-10-CM | POA: Diagnosis not present

## 2016-06-29 DIAGNOSIS — E785 Hyperlipidemia, unspecified: Secondary | ICD-10-CM | POA: Diagnosis not present

## 2016-07-05 ENCOUNTER — Other Ambulatory Visit: Payer: Self-pay | Admitting: Cardiovascular Disease

## 2016-07-29 DIAGNOSIS — M25562 Pain in left knee: Secondary | ICD-10-CM | POA: Diagnosis not present

## 2016-07-29 DIAGNOSIS — Z79899 Other long term (current) drug therapy: Secondary | ICD-10-CM | POA: Diagnosis not present

## 2016-07-29 DIAGNOSIS — I10 Essential (primary) hypertension: Secondary | ICD-10-CM | POA: Diagnosis not present

## 2016-08-04 ENCOUNTER — Other Ambulatory Visit: Payer: Self-pay | Admitting: Family

## 2016-08-04 DIAGNOSIS — R911 Solitary pulmonary nodule: Secondary | ICD-10-CM

## 2016-08-09 ENCOUNTER — Other Ambulatory Visit: Payer: Self-pay | Admitting: Cardiovascular Disease

## 2016-08-09 MED ORDER — CARVEDILOL 25 MG PO TABS: 25.0000 mg | ORAL_TABLET | Freq: Two times a day (BID) | ORAL | 0 refills | Status: DC

## 2016-08-12 ENCOUNTER — Other Ambulatory Visit: Payer: Self-pay

## 2016-08-17 ENCOUNTER — Ambulatory Visit
Admission: RE | Admit: 2016-08-17 | Discharge: 2016-08-17 | Disposition: A | Discharge status: Home / Self Care | Payer: Medicare Other | Source: Ambulatory Visit | Attending: Family | Admitting: Family

## 2016-08-17 DIAGNOSIS — R911 Solitary pulmonary nodule: Secondary | ICD-10-CM

## 2016-08-17 DIAGNOSIS — R918 Other nonspecific abnormal finding of lung field: Secondary | ICD-10-CM | POA: Diagnosis not present

## 2016-09-14 ENCOUNTER — Other Ambulatory Visit: Payer: Self-pay | Admitting: Family Medicine

## 2016-09-14 DIAGNOSIS — N644 Mastodynia: Secondary | ICD-10-CM

## 2016-09-24 ENCOUNTER — Other Ambulatory Visit: Payer: Self-pay | Admitting: Family Medicine

## 2016-09-24 DIAGNOSIS — N644 Mastodynia: Secondary | ICD-10-CM

## 2016-10-07 ENCOUNTER — Ambulatory Visit (INDEPENDENT_AMBULATORY_CARE_PROVIDER_SITE_OTHER): Payer: Medicare Other | Admitting: Physician Assistant

## 2016-10-07 ENCOUNTER — Encounter: Payer: Self-pay | Admitting: Physician Assistant

## 2016-10-07 VITALS — BP 124/86 | HR 87 | Ht 72.0 in | Wt 283.8 lb

## 2016-10-07 DIAGNOSIS — I482 Chronic atrial fibrillation, unspecified: Secondary | ICD-10-CM

## 2016-10-07 DIAGNOSIS — Z8679 Personal history of other diseases of the circulatory system: Secondary | ICD-10-CM

## 2016-10-07 DIAGNOSIS — G4733 Obstructive sleep apnea (adult) (pediatric): Secondary | ICD-10-CM

## 2016-10-07 DIAGNOSIS — R0609 Other forms of dyspnea: Secondary | ICD-10-CM | POA: Diagnosis not present

## 2016-10-07 DIAGNOSIS — Z952 Presence of prosthetic heart valve: Secondary | ICD-10-CM

## 2016-10-07 DIAGNOSIS — E7849 Other hyperlipidemia: Secondary | ICD-10-CM

## 2016-10-07 DIAGNOSIS — E784 Other hyperlipidemia: Secondary | ICD-10-CM

## 2016-10-07 DIAGNOSIS — Z9889 Other specified postprocedural states: Secondary | ICD-10-CM

## 2016-10-07 DIAGNOSIS — Z9989 Dependence on other enabling machines and devices: Secondary | ICD-10-CM

## 2016-10-07 DIAGNOSIS — I5022 Chronic systolic (congestive) heart failure: Secondary | ICD-10-CM

## 2016-10-07 NOTE — Patient Instructions (Signed)
Medication Instructions:  Your physician recommends that you continue on your current medications as directed. Please refer to the Current Medication list given to you today.    Labwork: TODAY: pro BNP  Testing/Procedures: Your physician has requested that you have an echocardiogram. Echocardiography is a painless test that uses sound waves to create images of your heart. It provides your doctor with information about the size and shape of your heart and how well your heart's chambers and valves are working. This procedure takes approximately one hour. There are no restrictions for this procedure.    Follow-Up: Your physician recommends that you schedule a follow-up appointment in: 3 weeks with Vin Bhagat while Dr. [name] is in the office.   Any Other Special Instructions Will Be Listed Below (If Applicable).     If you need a refill on your cardiac medications before your next appointment, please call your pharmacy.

## 2016-10-07 NOTE — Progress Notes (Signed)
Cardiology Office Note    Date:  10/07/2016   ID:  [name] [name], DOB Jan 25, 1932, MRN [id number]  PCP:  Cammy Copa, MD  Cardiologist:  Dr. Acie Fredrickson  Chief Complaint: Dyspnea  History of Present Illness:   [name] [name] is a 84 y.o. male with hx of aortic insufficiency s/p  AVR, chronic AFib s/p clipping, CAD, Chronic systolic heart failure HTN, HL, OSA on CPAP, AAA s/p repair in 2007 by Dr. Scot Dock, hypothyroidism, COPD, CKD stage III, s/p traumatic brain injury with craniotomy in 1960 presents for evaluation of dyspnea on exertion.  He underwent bioprosthetic AVR and clipping of the left atrial appendage 01/2015. Bilateral saphenous veins were unsuitable for grafting. She has 70% distal RCA.   Last echocardiogram 02/2015 showed left ventricular function of 20-25%.  He was doing well on cardiac stand point when seen last by Dr. Acie Fredrickson 10/2015.  He was noted worsening dyspnea on exertion over the past year. Work up then by PCP as below. BNP of 119 October 2017. CT angio of chest 07/2016:   -  Ascending thoracic aorta has a measured diameter 4.4 x 4.3 cm. Recommend annual imaging followup by CTA or MRA.  - There are areas of atherosclerotic calcification as well as coronary artery calcifications. Patient is status post aortic valve     replacement and left aortic appendage clamp placement.  - Small nodular opacities, largest measuring 6 mm. Non-contrast chest CT at 6-12 months is recommended.   - Thyroid quite diminutive.  No adenopathy.   - Focal hiatal hernia.  Here today for follow-up. Patient has a chronic dyspnea on exertion for the past 6-9 months. This has been stable. He has gained approximately 30 pounds during this period of time. He is mostly sedentary due to chronic knee issue. PT denies any orthopnea, PND, syncope, palpitation, chest pain, LE edema, melena or blood in stool or urine. No loss of appetite.  Not using extra salt.    Past Medical History:  Diagnosis Date    . Anxiety   . Aortic valve insufficiency   . Arthritis    "wee bit; knees, elbows" (10/15/2014)  . Atrial fibrillation ([place])   . Cardiomyopathy ([place])   . Chronic kidney disease    "down to ~ 1/2 of their regular use; I see kidney dr. in [place]" (10/15/2014)  . Complication of anesthesia    unsure of complications but pt. states there were complications  . COPD (chronic obstructive pulmonary disease) ([place])   . Coronary artery disease   . Decreased testosterone level   . Depression   . Difficult intubation    difficult intubation 12/03/09 and 03/27/10 ([facility]); glidescope used 03/27/10  . DVT (deep venous thrombosis) ([place]) ~ 2013   "BLE"  . Dysrhythmia    A. Fib  . Emphysema of lung ([place])   . Enlarged prostate   . Falls frequently    > 20 times in the last year/notes 10/15/2014  . GERD (gastroesophageal reflux disease)   . Heart murmur   . History of blood transfusion 1960   "lots; related to an accident"  . History of echocardiogram    post AVR >> Echo 7/16:  Mild LVH, EF 20-25%, AVR ok (peak 15 mmHg, mean 9 mmHg), MAC, mild MR, severe LAE, mild reduced RVSF, mild RAE, PASP 35 mmHg  . Hypercholesteremia   . Hypertension   . Hypothyroidism   . OSA (obstructive sleep apnea)    "suppose to wear mask; I throw it off in  my sleep" (10/15/2014)  . Plantar fasciitis   . Seasonal allergies   . Shortness of breath dyspnea   . Syncope and collapse "several times"  . Urine frequency   . Urine incontinence   . Varicose veins     Past Surgical History:  Procedure Laterality Date  . ABDOMINAL AORTIC ANEURYSM REPAIR  01/2006; 03/10/2006   Archie Endo 01/12/2011  . AORTIC VALVE REPLACEMENT N/A 02/11/2015   Procedure: AORTIC VALVE REPLACEMENT (AVR);  Surgeon: Grace Isaac, MD;  Location: [facility];  Service: Open Heart Surgery;  Laterality: N/A;  . BRAIN SURGERY  1960 X 4   "S/P got my skull busted"; pt. states removed internal carotid artery  . CARDIAC CATHETERIZATION  1990's X 1; 09/2014   no  stents  . CLIPPING OF ATRIAL APPENDAGE N/A 02/11/2015   Procedure: CLIPPING OF ATRIAL APPENDAGE;  Surgeon: Grace Isaac, MD;  Location: [facility];  Service: Open Heart Surgery;  Laterality: N/A;  . ERCP  11/2009   Archie Endo 12/05/2009  . gall stone    . HERNIA REPAIR    . LAPAROSCOPIC CHOLECYSTECTOMY  08/2009   w/IOC/notes 09/18/2009  . LAPAROSCOPIC INCISIONAL / UMBILICAL / VENTRAL HERNIA REPAIR  02/2010   VHR w/mesh/notes 03/28/2010  . NOSE SURGERY    . TEE WITHOUT CARDIOVERSION N/A 02/11/2015   Procedure: TRANSESOPHAGEAL ECHOCARDIOGRAM (TEE);  Surgeon: Grace Isaac, MD;  Location: [facility];  Service: Open Heart Surgery;  Laterality: N/A;  . TONSILLECTOMY      Current Medications: Prior to Admission medications   Medication Sig Start Date End Date Taking? Authorizing Provider  albuterol (PROVENTIL HFA;VENTOLIN HFA) 108 (90 BASE) MCG/ACT inhaler Inhale 2 puffs into the lungs every 6 (six) hours as needed for wheezing or shortness of breath. Reported on 11/03/2015    Historical Provider, MD  amLODipine (NORVASC) 2.5 MG tablet Take 2 tablets (5 mg total) by mouth daily. 05/30/15   Thayer Headings, MD  atorvastatin (LIPITOR) 10 MG tablet Take 1 tablet (10 mg total) by mouth daily. 02/28/15   Lavon Paganini Angiulli, PA-C  buPROPion (WELLBUTRIN XL) 300 MG 24 hr tablet Take 1 tablet (300 mg total) by mouth daily. 02/28/15   Lavon Paganini Angiulli, PA-C  busPIRone (BUSPAR) 15 MG tablet Take 1 tablet (15 mg total) by mouth 2 (two) times daily. 02/28/15   Lavon Paganini Angiulli, PA-C  carvedilol (COREG) 25 MG tablet Take 1 tablet (25 mg total) by mouth 2 (two) times daily. 08/09/16   Thayer Headings, MD  clonazePAM (KLONOPIN) 0.5 MG tablet Take 1 tablet (0.5 mg total) by mouth at bedtime. 02/28/15   Lavon Paganini Angiulli, PA-C  dutasteride (AVODART) 0.5 MG capsule Take 1 capsule (0.5 mg total) by mouth daily. 07/16/15   Meredith Staggers, MD  ELIQUIS 2.5 MG TABS tablet TAKE 1 TABLET BY MOUTH TWICE A DAY 01/27/16   Thayer Headings, MD    escitalopram (LEXAPRO) 20 MG tablet Take 1 tablet (20 mg total) by mouth daily. 02/28/15   Lavon Paganini Angiulli, PA-C  fluticasone (FLONASE) 50 MCG/ACT nasal spray Place 1 spray into both nostrils daily. 02/28/15   Lavon Paganini Angiulli, PA-C  folic acid (FOLVITE) 1 MG tablet Take 1 tablet (1 mg total) by mouth daily. 08/13/15   Meredith Staggers, MD  lamoTRIgine (LAMICTAL) 100 MG tablet Take 1 tablet (100 mg total) by mouth 2 (two) times daily. Breakfast and dinner 02/28/15   Lavon Paganini Angiulli, PA-C  levothyroxine (SYNTHROID, LEVOTHROID) 175 MCG tablet  Take 175 mcg by mouth daily before breakfast.    Historical Provider, MD  mirtazapine (REMERON) 7.5 MG tablet Take 7.5 mg by mouth daily. 10/13/15   Historical Provider, MD    Allergies:   Patient has no known allergies.   Social History   Social History  . Marital status: Married    Spouse name: Joycelyn Schmid  . Number of children: 1  . Years of education: Master's   Social History Main Topics  . Smoking status: Former Smoker    Packs/day: 2.00    Years: 22.00    Types: Cigarettes    Quit date: 04/29/1974  . Smokeless tobacco: Never Used  . Alcohol use 0.0 oz/week     Comment: 1 beer per year   . Drug use: No  . Sexual activity: Not Currently   Other Topics Concern  . None   Social History Narrative   Lives at home with wife.   Right handed.   Caffeine use: 3 cups coffee per day.   Drinks 4 cups tea per day   Drinks soda very rarely      Family History:  The patient's family history includes Cancer in his sister; Heart disease in his father; Rheum arthritis in his father and mother; Stroke in his sister.   ROS:   Please see the history of present illness.    ROS All other systems reviewed and are negative.   PHYSICAL EXAM:   VS:  BP 124/86   Pulse 87   Ht 6' (1.829 m)   Wt 283 lb 12.8 oz (128.7 kg)   BMI 38.49 kg/m    GEN: Well nourished, well developed, obese  in no acute distress  HEENT: normal  Neck: no JVD, carotid bruits,  or masses Cardiac: Ir IR ; soft  murmurs, rubs, or gallops, trace to 1 + edema R > L.  Respiratory:  clear to auscultation bilaterally, normal work of breathing GI: soft, nontender, nondistended, + BS MS: no deformity or atrophy  Skin: warm and dry, no rash Neuro:  Alert and Oriented x 3, Strength and sensation are intact Psych: euthymic mood, full affect  Wt Readings from Last 3 Encounters:  10/07/16 283 lb 12.8 oz (128.7 kg)  01/27/16 250 lb (113.4 kg)  12/24/15 250 lb (113.4 kg)      Studies/Labs Reviewed:   EKG:  EKG is ordered today.  The ekg ordered today demonstrates afib at rate of 87 bpm. No acute changes.   Recent Labs: No results found for requested labs within last 8760 hours.   Lipid Panel No results found for: CHOL, TRIG, HDL, CHOLHDL, VLDL, LDLCALC, LDLDIRECT  Additional studies/ records that were reviewed today include:   Carotid US 4/91/79 RICA 15-05%; LICA absent  LHC 6/97/94 ([facility]) LM:  Ok LAD:  Insignificant LCx:  Normal RCA:  Dist 70%  Echo 03/20/15 LV EF: 20% -   25%  ------------------------------------------------------------------- Indications:      Z95.4 Status post Aortic Valve Replacement.  ------------------------------------------------------------------- History:   PMH:  Acquired from the patient and from the patient&'s chart.  PMH:  Atrial Fibrillation. Hypothyroidism. Murmur. DVT. Obstructive Sleep Apnea. Emphysema. Coronary Artery Disease. Chronic Kidney Disease.  Risk factors:  Hypertension. Hypercholesterolemia.  ------------------------------------------------------------------- Study Conclusions  - Left ventricle: The cavity size was normal. Wall thickness was   increased in a pattern of mild LVH. Systolic function was   severely reduced. The estimated ejection fraction was in the   range of 20% to 25%. - Aortic  valve: AV prosthesis opens well Peak and mean gradients   through the valve are 15 and 9 mm Hg  respectively. - Mitral valve: Calcified annulus. Mildly thickened leaflets .   There was mild regurgitation. - Left atrium: The atrium was severely dilated. - Right ventricle: Systolic function was mildly reduced. - Right atrium: The atrium was mildly dilated. - Pulmonary arteries: PA peak pressure: 35 mm Hg (S).    ASSESSMENT & PLAN:   1. Dyspnea on exertion - No obvious etiology. This been chronic for the past 6-9 months with 30 pound weight gain. He needs to stop after walking a few 100s Steps. Diffrential includes deconditioning due to mostly sedentary lifestyle. He has a trace to 1 + BL LE edema R> L (hx of varicose veins). Update his echocardiogram to reassess his LV (20-25% on echo) and valvular function. BNP if elevated he will required diuretics.   2. Aortic valve insufficiency s/p AVR - As above  3.  Persistant atrial fibrilaltion - Rate controlled. S/p clipping. Continue coreg and Eliquis. No bleeding issue.   3. CAD with 70% dist RCA - As noted, he did not have adequate SVG for grafting. ? his dyspnea on exertion due to this. Workup negative as above might consider ischemic evaluation.  4. AAA s/p repair  5. HTN - Stable and well controlled on current regimen.  6. HLD  - Continue statin.   7. Chronic systolic CHF - EF was 63-87% on last echo 02/2015. As above   8. OSA on CPAP  9. Ascending thoracic aorta has a measured diameter 4.4 x 4.3 cm. Recommend annual imaging followup by CTA or MRA  Medication Adjustments/Labs and Tests Ordered: Current medicines are reviewed at length with the patient today.  Concerns regarding medicines are outlined above.  Medication changes, Labs and Tests ordered today are listed in the Patient Instructions below. There are no Patient Instructions on file for this visit.   Jarrett Soho, [place]  10/07/2016 3:07 PM    [facility] [address], [place], [place]  [address] Phone: [phone]; Fax:  [phone]

## 2016-10-08 LAB — PRO B NATRIURETIC PEPTIDE: NT-Pro BNP: 918 pg/mL — ABNORMAL HIGH (ref 0–486)

## 2016-10-12 ENCOUNTER — Telehealth: Payer: Self-pay | Admitting: Physician Assistant

## 2016-10-12 NOTE — Telephone Encounter (Signed)
Follow Up:; ° ° °Returning your call. °

## 2016-10-13 MED ORDER — FUROSEMIDE 40 MG PO TABS: ORAL_TABLET | ORAL | 0 refills | Status: DC

## 2016-10-13 MED ORDER — POTASSIUM CHLORIDE ER 10 MEQ PO TBCR: EXTENDED_RELEASE_TABLET | ORAL | 1 refills | Status: DC

## 2016-10-13 NOTE — Telephone Encounter (Signed)
-----   Message from AltonBhavinkumar Bhagat, [place] sent at 10/13/2016  9:59 AM EST ----- Continue current recommendations of increased lasix. F/u next week with labs.

## 2016-10-13 NOTE — Telephone Encounter (Signed)
Left a detailed message, per pt, on vmail at home. He has been advised to start Lasix 40 mg bid and Potassium 10 meq bid X's 5 days. Pt will come back in for o/v with Vin 10/20/16 @ 2:30. Pt was advised his rx's will be sent to [facility] [address]. Pt advised if he had questions, to call me back.

## 2016-10-19 NOTE — Progress Notes (Deleted)
Cardiology Office Note    Date:  10/19/2016   ID:  [name] [name], DOB [birth year], MRN [id number]  PCP:  [name] Copa, MD  Cardiologist: Dr. Acie Fredrickson   Chief Complaint: follow up on dyspnea   History of Present Illness:   [name] [name] is a 83 y.o. male hx of aortic insufficiency s/p  AVR, chronic AFib s/p clipping, CAD, Chronic systolic heart failure HTN, HL, OSA on CPAP, AAA s/p repair in 2007 by Dr. Scot [name], hypothyroidism, COPD, CKD stage III, s/p traumatic brain injury with craniotomy in 1960 presents for follow up.    He underwent bioprosthetic AVR and clipping of the left atrial appendage 01/2015. Bilateral saphenous veins were unsuitable for grafting. She has 70% distal RCA.   Last echocardiogram 02/2015 showed left ventricular function of 20-25%.    He was noted worsening dyspnea on exertion over the past year. Work up then by PCP as below. BNP of 119 October 2017. CT angio of chest 07/2016:  -  Ascending thoracic aorta has a measured diameter 4.4 x 4.3 cm. Recommend annual imaging followup by CTA or MRA.  - There are areas of atherosclerotic calcification as well as coronary artery calcifications. Patient is status post aortic valve replacement and left aortic appendage clamp placement.  - Small nodular opacities, largest measuring 6 mm. Non-contrast chest CT at 6-12 months is recommended.  - Thyroid quite diminutive. No adenopathy.  - Focal hiatal hernia.  Seen by me 10/07/16 for dyspnea on exertion for the past 6-9 months with weight gain of 30lbs. BNP level was up. Increased lasix for few days. Pending echo.   Here today for follow up.     Past Medical History:  Diagnosis Date  . Anxiety   . Aortic valve insufficiency   . Arthritis    "wee bit; knees, elbows" (10/15/2014)  . Atrial fibrillation ([place])   . Cardiomyopathy ([place])   . Chronic kidney disease    "down to ~ 1/2 of their regular use; I see kidney dr. in [place]" (10/15/2014)  . Complication of  anesthesia    unsure of complications but pt. states there were complications  . COPD (chronic obstructive pulmonary disease) ([place])   . Coronary artery disease   . Decreased testosterone level   . Depression   . Difficult intubation    difficult intubation 12/03/09 and 03/27/10 ([facility]); glidescope used 03/27/10  . DVT (deep venous thrombosis) ([place]) ~ 2013   "BLE"  . Dysrhythmia    A. Fib  . Emphysema of lung ([place])   . Enlarged prostate   . Falls frequently    > 20 times in the last year/notes 10/15/2014  . GERD (gastroesophageal reflux disease)   . Heart murmur   . History of blood transfusion 1960   "lots; related to an accident"  . History of echocardiogram    post AVR >> Echo 7/16:  Mild LVH, EF 20-25%, AVR ok (peak 15 mmHg, mean 9 mmHg), MAC, mild MR, severe LAE, mild reduced RVSF, mild RAE, PASP 35 mmHg  . Hypercholesteremia   . Hypertension   . Hypothyroidism   . OSA (obstructive sleep apnea)    "suppose to wear mask; I throw it off in my sleep" (10/15/2014)  . Plantar fasciitis   . Seasonal allergies   . Shortness of breath dyspnea   . Syncope and collapse "several times"  . Urine frequency   . Urine incontinence   . Varicose veins     Past Surgical History:  Procedure Laterality  Date  . ABDOMINAL AORTIC ANEURYSM REPAIR  01/2006; 03/10/2006   Archie Endo 01/12/2011  . AORTIC VALVE REPLACEMENT N/A 02/11/2015   Procedure: AORTIC VALVE REPLACEMENT (AVR);  Surgeon: Grace Isaac, MD;  Location: [facility];  Service: Open Heart Surgery;  Laterality: N/A;  . BRAIN SURGERY  1960 X 4   "S/P got my skull busted"; pt. states removed internal carotid artery  . CARDIAC CATHETERIZATION  1990's X 1; 09/2014   no stents  . CLIPPING OF ATRIAL APPENDAGE N/A 02/11/2015   Procedure: CLIPPING OF ATRIAL APPENDAGE;  Surgeon: Grace Isaac, MD;  Location: [facility];  Service: Open Heart Surgery;  Laterality: N/A;  . ERCP  11/2009   Archie Endo 12/05/2009  . gall stone    . HERNIA REPAIR    . LAPAROSCOPIC  CHOLECYSTECTOMY  08/2009   w/IOC/notes 09/18/2009  . LAPAROSCOPIC INCISIONAL / UMBILICAL / VENTRAL HERNIA REPAIR  02/2010   VHR w/mesh/notes 03/28/2010  . NOSE SURGERY    . TEE WITHOUT CARDIOVERSION N/A 02/11/2015   Procedure: TRANSESOPHAGEAL ECHOCARDIOGRAM (TEE);  Surgeon: Grace Isaac, MD;  Location: [facility];  Service: Open Heart Surgery;  Laterality: N/A;  . TONSILLECTOMY      Current Medications: Prior to Admission medications   Medication Sig Start Date End Date Taking? Authorizing Provider  albuterol (PROVENTIL HFA;VENTOLIN HFA) 108 (90 BASE) MCG/ACT inhaler Inhale 2 puffs into the lungs every 6 (six) hours as needed for wheezing or shortness of breath. Reported on 11/03/2015    Historical Provider, MD  amLODipine (NORVASC) 2.5 MG tablet Take 2 tablets (5 mg total) by mouth daily. 05/30/15   Thayer Headings, MD  atorvastatin (LIPITOR) 10 MG tablet Take 1 tablet (10 mg total) by mouth daily. 02/28/15   Lavon Paganini Angiulli, PA-C  buPROPion (WELLBUTRIN XL) 300 MG 24 hr tablet Take 1 tablet (300 mg total) by mouth daily. 02/28/15   Lavon Paganini Angiulli, PA-C  busPIRone (BUSPAR) 15 MG tablet Take 1 tablet (15 mg total) by mouth 2 (two) times daily. 02/28/15   Lavon Paganini Angiulli, PA-C  carvedilol (COREG) 25 MG tablet Take 1 tablet (25 mg total) by mouth 2 (two) times daily. 08/09/16   Thayer Headings, MD  clonazePAM (KLONOPIN) 0.5 MG tablet Take 1 tablet (0.5 mg total) by mouth at bedtime. 02/28/15   Lavon Paganini Angiulli, PA-C  dutasteride (AVODART) 0.5 MG capsule Take 1 capsule (0.5 mg total) by mouth daily. 07/16/15   Meredith Staggers, MD  ELIQUIS 2.5 MG TABS tablet TAKE 1 TABLET BY MOUTH TWICE A DAY 01/27/16   Thayer Headings, MD  escitalopram (LEXAPRO) 20 MG tablet Take 1 tablet (20 mg total) by mouth daily. 02/28/15   Lavon Paganini Angiulli, PA-C  fluticasone (FLONASE) 50 MCG/ACT nasal spray Place 1 spray into both nostrils daily. 02/28/15   Lavon Paganini Angiulli, PA-C  folic acid (FOLVITE) 1 MG tablet Take 1 tablet (1 mg  total) by mouth daily. 08/13/15   Meredith Staggers, MD  furosemide (LASIX) 40 MG tablet Take 1 tablet by mouth twice a day X's 5 days then stop 10/13/16   Leanor Kail, PA  lamoTRIgine (LAMICTAL) 100 MG tablet Take 1 tablet (100 mg total) by mouth 2 (two) times daily. Breakfast and dinner 02/28/15   Lavon Paganini Angiulli, PA-C  levothyroxine (SYNTHROID, LEVOTHROID) 175 MCG tablet Take 175 mcg by mouth daily before breakfast.    Historical Provider, MD  mirtazapine (REMERON) 7.5 MG tablet Take 7.5 mg by mouth daily. 10/13/15  Historical Provider, MD  potassium chloride (K-DUR) 10 MEQ tablet Take 1 tablet by mouth twice a day X's 5 days then stop. 10/13/16   Leanor Kail, PA    Allergies:   Patient has no known allergies.   Social History   Social History  . Marital status: Married    Spouse name: Joycelyn Schmid  . Number of children: 1  . Years of education: Master's   Social History Main Topics  . Smoking status: Former Smoker    Packs/day: 2.00    Years: 22.00    Types: Cigarettes    Quit date: 04/29/1974  . Smokeless tobacco: Never Used  . Alcohol use 0.0 oz/week     Comment: 1 beer per year   . Drug use: No  . Sexual activity: Not Currently   Other Topics Concern  . Not on file   Social History Narrative   Lives at home with wife.   Right handed.   Caffeine use: 3 cups coffee per day.   Drinks 4 cups tea per day   Drinks soda very rarely      Family History:  The patient's family history includes Cancer in his sister; Heart disease in his father; Rheum arthritis in his father and mother; Stroke in his sister. ***  ROS:   Please see the history of present illness.    ROS All other systems reviewed and are negative.   PHYSICAL EXAM:   VS:  There were no vitals taken for this visit.   GEN: Well nourished, well developed, in no acute distress  HEENT: normal  Neck: no JVD, carotid bruits, or masses Cardiac: ***RRR; no murmurs, rubs, or gallops,no edema  Respiratory:   clear to auscultation bilaterally, normal work of breathing GI: soft, nontender, nondistended, + BS MS: no deformity or atrophy  Skin: warm and dry, no rash Neuro:  Alert and Oriented x 3, Strength and sensation are intact Psych: euthymic mood, full affect  Wt Readings from Last 3 Encounters:  10/07/16 283 lb 12.8 oz (128.7 kg)  01/27/16 250 lb (113.4 kg)  12/24/15 250 lb (113.4 kg)      Studies/Labs Reviewed:   EKG:  EKG is ordered today.  The ekg ordered today demonstrates ***  Recent Labs: 10/07/2016: NT-Pro BNP 918   Lipid Panel No results found for: CHOL, TRIG, HDL, CHOLHDL, VLDL, LDLCALC, LDLDIRECT  Additional studies/ records that were reviewed today include:  Carotid US 6/81/27 RICA 51-70%; LICA absent  LHC 0/17/49 ([place]) LM: Ok LAD: Insignificant LCx: Normal RCA: Dist 70%  Echo 03/20/15 LV EF: 20% - 25%  ------------------------------------------------------------------- Indications: Z95.4 Status post Aortic Valve Replacement.  ------------------------------------------------------------------- History: PMH: Acquired from the patient and from the patient&'s chart. PMH: Atrial Fibrillation. Hypothyroidism. Murmur. DVT. Obstructive Sleep Apnea. Emphysema. Coronary Artery Disease. Chronic Kidney Disease. Risk factors: Hypertension. Hypercholesterolemia.  ------------------------------------------------------------------- Study Conclusions  - Left ventricle: The cavity size was normal. Wall thickness was increased in a pattern of mild LVH. Systolic function was severely reduced. The estimated ejection fraction was in the range of 20% to 25%. - Aortic valve: AV prosthesis opens well Peak and mean gradients through the valve are 15 and 9 mm Hg respectively. - Mitral valve: Calcified annulus. Mildly thickened leaflets . There was mild regurgitation. - Left atrium: The atrium was severely dilated. - Right ventricle: Systolic  function was mildly reduced. - Right atrium: The atrium was mildly dilated. - Pulmonary arteries: PA peak pressure: 35 mm Hg (S).    ASSESSMENT &  PLAN:    1. Dyspnea on exertion - No obvious etiology. This been chronic for the past 6-9 months with 30 pound weight gain. He needs to stop after walking a few 100s Steps. Diffrential includes deconditioning due to mostly sedentary lifestyle. He has a trace to 1 + BL LE edema R> L (hx of varicose veins). Update his echocardiogram to reassess his LV (20-25% on echo) and valvular function. BNP if elevated he will required diuretics.   2. Aortic valve insufficiency s/p AVR - As above  3.  Persistant atrial fibrilaltion - Rate controlled. S/p clipping. Continue coreg and Eliquis. No bleeding issue.   3. CAD with 70% dist RCA - As noted, he did not have adequate SVG for grafting. ? his dyspnea on exertion due to this. Workup negative as above might consider ischemic evaluation.  4. AAA s/p repair  5. HTN - Stable and well controlled on current regimen.  6. HLD  - Continue statin.   7. Chronic systolic CHF - EF was 75-30% on last echo 02/2015. As above   8. OSA on CPAP  9. Ascending thoracic aorta has a measured diameter 4.4 x 4.3 cm. Recommend annual imaging followup by CTA or MRA     Medication Adjustments/Labs and Tests Ordered: Current medicines are reviewed at length with the patient today.  Concerns regarding medicines are outlined above.  Medication changes, Labs and Tests ordered today are listed in the Patient Instructions below. There are no Patient Instructions on file for this visit.   Jarrett Soho, [place]  10/19/2016 10:06 AM    [facility] [place], [place], [place]  [address] Phone: [phone]; Fax: [phone]

## 2016-10-20 ENCOUNTER — Ambulatory Visit: Payer: Self-pay | Admitting: Physician Assistant

## 2016-10-21 ENCOUNTER — Other Ambulatory Visit: Payer: Self-pay

## 2016-10-21 ENCOUNTER — Ambulatory Visit (HOSPITAL_COMMUNITY): Payer: Medicare Other | Attending: Cardiology

## 2016-10-21 DIAGNOSIS — Z952 Presence of prosthetic heart valve: Secondary | ICD-10-CM | POA: Insufficient documentation

## 2016-10-21 DIAGNOSIS — I348 Other nonrheumatic mitral valve disorders: Secondary | ICD-10-CM | POA: Diagnosis not present

## 2016-10-21 DIAGNOSIS — Z9889 Other specified postprocedural states: Secondary | ICD-10-CM | POA: Insufficient documentation

## 2016-10-21 DIAGNOSIS — I482 Chronic atrial fibrillation, unspecified: Secondary | ICD-10-CM

## 2016-10-21 DIAGNOSIS — I517 Cardiomegaly: Secondary | ICD-10-CM | POA: Insufficient documentation

## 2016-10-22 NOTE — Progress Notes (Signed)
Cardiology Office Note    Date:  10/26/2016   ID:  [name] [name], DOB 21-Aug-1932, MRN [id number]  PCP:  Cammy Copa, MD  Cardiologist:  Dr. Acie Fredrickson  Chief Complaint: F/u on Dyspnea  History of Present Illness:   [name] [name] is a 83 y.o. male with hx of aortic insufficiency s/p  AVR, chronic AFib s/p clipping, CAD, Chronic systolic heart failure HTN, HL, OSA on CPAP, AAA s/p repair in 2007 by Dr. Scot Dock, hypothyroidism, COPD, CKD stage III, s/p traumatic brain injury with craniotomy in 1960 presents for follow up.   He underwent bioprosthetic AVR and clipping of the left atrial appendage 01/2015. Bilateral saphenous veins were unsuitable for grafting. She has 70% distal RCA.   Last echocardiogram 02/2015 showed left ventricular function of 20-25%.  He was doing well on cardiac stand point when seen last by Dr. Acie Fredrickson 10/2015.  He was noted worsening dyspnea on exertion over the past year. Work up then by PCP as below. BNP of 119 October 2017.  CT angio of chest 07/2016:  -  Ascending thoracic aorta has a measured diameter 4.4 x 4.3 cm. Recommend annual imaging followup by CTA or MRA. - There are areas of atherosclerotic calcification as well as coronary artery calcifications. Patient is status post aortic valve replacement and left aortic appendage clamp placement. - Small nodular opacities, largest measuring 6 mm. Non-contrast chest CT at 6-12 months is recommended.  - Thyroid quite diminutive. No adenopathy. - Focal hiatal hernia.  Seen by me for chronic dyspnea on exertion for the past 6-9 months with weight gain of 30lb. BNP was elevated to 918. Treated with Lasix 78m BID and Kdur 174m BID x 5 days. Echo showed normal LV function; grade 1 DD; s/p AVR with normal mean gradient and no AI; severe LAE; trace TR; mildly elevated pulmonary pressure.  Here today for follow up. He continues to have shortness of breath with exertion. He denies any orthopnea, PND, lower extremity  edema, melena, chest pain, syncope, dizziness or palpitation. He did not notice any improvement when treated with diuretics a few weeks ago.  Past Medical History:  Diagnosis Date  . Anxiety   . Aortic valve insufficiency   . Arthritis    "wee bit; knees, elbows" (10/15/2014)  . Atrial fibrillation ([facility]  . Cardiomyopathy ([facility]  . Chronic kidney disease    "down to ~ 1/2 of their regular use; [place]  . Complication of anesthesia    unsure of complications but pt. states there were complications  . COPD (chronic obstructive pulmonary disease) ([facility]  . Coronary artery disease   . Decreased testosterone level   . Depression   . Difficult intubation    difficult intubation 12/03/09 and 03/27/10 ([facility]); glidescope used 03/27/10  . DVT (deep venous thrombosis) ([facility]~ 2013   "BLE"  . Dysrhythmia    A. Fib  . Emphysema of lung ([facility]  . Enlarged prostate   . Falls frequently    > 20 times in the last year/notes 10/15/2014  . GERD (gastroesophageal reflux disease)   . Heart murmur   . History of blood transfusion 1960   "lots; related to an accident"  . History of echocardiogram    post AVR >> Echo 7/16:  Mild LVH, EF 20-25%, AVR ok (peak 15 mmHg, mean 9 mmHg), MAC, mild MR, severe LAE, mild reduced RVSF, mild RAE, PASP 35 mmHg  . Hypercholesteremia   .  Hypertension   . Hypothyroidism   . OSA (obstructive sleep apnea)    "suppose to wear mask; I throw it off in my sleep" (10/15/2014)  . Plantar fasciitis   . Seasonal allergies   . Shortness of breath dyspnea   . Syncope and collapse "several times"  . Urine frequency   . Urine incontinence   . Varicose veins     Past Surgical History:  Procedure Laterality Date  . ABDOMINAL AORTIC ANEURYSM REPAIR  01/2006; 03/10/2006   Archie Endo 01/12/2011  . AORTIC VALVE REPLACEMENT N/A 02/11/2015   Procedure: AORTIC VALVE REPLACEMENT (AVR);  Surgeon: Grace Isaac, MD;  Location: [place];  Service: Open Heart  Surgery;  Laterality: N/A;  . BRAIN SURGERY  1960 X 4   "S/P got my skull busted"; pt. states removed internal carotid artery  . CARDIAC CATHETERIZATION  1990's X 1; 09/2014   no stents  . CLIPPING OF ATRIAL APPENDAGE N/A 02/11/2015   Procedure: CLIPPING OF ATRIAL APPENDAGE;  Surgeon: Grace Isaac, MD;  Location: [place];  Service: Open Heart Surgery;  Laterality: N/A;  . ERCP  11/2009   Archie Endo 12/05/2009  . gall stone    . HERNIA REPAIR    . LAPAROSCOPIC CHOLECYSTECTOMY  08/2009   w/IOC/notes 09/18/2009  . LAPAROSCOPIC INCISIONAL / UMBILICAL / VENTRAL HERNIA REPAIR  02/2010   VHR w/mesh/notes 03/28/2010  . NOSE SURGERY    . TEE WITHOUT CARDIOVERSION N/A 02/11/2015   Procedure: TRANSESOPHAGEAL ECHOCARDIOGRAM (TEE);  Surgeon: Grace Isaac, MD;  Location: [place];  Service: Open Heart Surgery;  Laterality: N/A;  . TONSILLECTOMY      Current Medications: Prior to Admission medications   Medication Sig Start Date End Date Taking? Authorizing Provider  albuterol (PROVENTIL HFA;VENTOLIN HFA) 108 (90 BASE) MCG/ACT inhaler Inhale 2 puffs into the lungs every 6 (six) hours as needed for wheezing or shortness of breath. Reported on 11/03/2015    Historical Provider, MD  amLODipine (NORVASC) 2.5 MG tablet Take 2 tablets (5 mg total) by mouth daily. 05/30/15   Thayer Headings, MD  atorvastatin (LIPITOR) 10 MG tablet Take 1 tablet (10 mg total) by mouth daily. 02/28/15   Lavon Paganini Angiulli, PA-C  buPROPion (WELLBUTRIN XL) 300 MG 24 hr tablet Take 1 tablet (300 mg total) by mouth daily. 02/28/15   Lavon Paganini Angiulli, PA-C  busPIRone (BUSPAR) 15 MG tablet Take 1 tablet (15 mg total) by mouth 2 (two) times daily. 02/28/15   Lavon Paganini Angiulli, PA-C  carvedilol (COREG) 25 MG tablet Take 1 tablet (25 mg total) by mouth 2 (two) times daily. 08/09/16   Thayer Headings, MD  clonazePAM (KLONOPIN) 0.5 MG tablet Take 1 tablet (0.5 mg total) by mouth at bedtime. 02/28/15   Lavon Paganini Angiulli, PA-C  dutasteride (AVODART) 0.5 MG  capsule Take 1 capsule (0.5 mg total) by mouth daily. 07/16/15   Meredith Staggers, MD  ELIQUIS 2.5 MG TABS tablet TAKE 1 TABLET BY MOUTH TWICE A DAY 01/27/16   Thayer Headings, MD  escitalopram (LEXAPRO) 20 MG tablet Take 1 tablet (20 mg total) by mouth daily. 02/28/15   Lavon Paganini Angiulli, PA-C  fluticasone (FLONASE) 50 MCG/ACT nasal spray Place 1 spray into both nostrils daily. 02/28/15   Lavon Paganini Angiulli, PA-C  folic acid (FOLVITE) 1 MG tablet Take 1 tablet (1 mg total) by mouth daily. 08/13/15   Meredith Staggers, MD  furosemide (LASIX) 40 MG tablet Take 1 tablet by mouth twice  a day X's 5 days then stop 10/13/16   Leanor Kail, PA  lamoTRIgine (LAMICTAL) 100 MG tablet Take 1 tablet (100 mg total) by mouth 2 (two) times daily. Breakfast and dinner 02/28/15   Lavon Paganini Angiulli, PA-C  levothyroxine (SYNTHROID, LEVOTHROID) 175 MCG tablet Take 175 mcg by mouth daily before breakfast.    Historical Provider, MD  mirtazapine (REMERON) 7.5 MG tablet Take 7.5 mg by mouth daily. 10/13/15   Historical Provider, MD  potassium chloride (K-DUR) 10 MEQ tablet Take 1 tablet by mouth twice a day X's 5 days then stop. 10/13/16   Leanor Kail, PA    Allergies:   Patient has no known allergies.   Social History   Social History  . Marital status: Married    Spouse name: Joycelyn Schmid  . Number of children: 1  . Years of education: Master's   Social History Main Topics  . Smoking status: Former Smoker    Packs/day: 2.00    Years: 22.00    Types: Cigarettes    Quit date: 04/29/1974  . Smokeless tobacco: Never Used  . Alcohol use 0.0 oz/week     Comment: 1 beer per year   . Drug use: No  . Sexual activity: Not Currently   Other Topics Concern  . None   Social History Narrative   Lives at home with wife.   Right handed.   Caffeine use: 3 cups coffee per day.   Drinks 4 cups tea per day   Drinks soda very rarely      Family History:  The patient's family history includes Cancer in his sister;  Heart disease in his father; Rheum arthritis in his father and mother; Stroke in his sister.   ROS:   Please see the history of present illness.    ROS All other systems reviewed and are negative.   PHYSICAL EXAM:   VS:  BP (!) 142/80   Pulse 78   Ht 6' (1.829 m)   Wt 284 lb 8 oz (129 kg)   SpO2 96%   BMI 38.59 kg/m    GEN: Well nourished, well developed, in no acute distress  HEENT: normal  Neck: no JVD, carotid bruits, or masses Cardiac: RRR; no murmurs, rubs, or gallops, Trace BL LE edema  Respiratory:  normal work of breathing, Faint rales bilaterally GI: soft, nontender, nondistended, + BS MS: no deformity or atrophy  Skin: warm and dry, no rash Neuro:  Alert and Oriented x 3, Strength and sensation are intact Psych: euthymic mood, full affect  Wt Readings from Last 3 Encounters:  10/26/16 284 lb 8 oz (129 kg)  10/07/16 283 lb 12.8 oz (128.7 kg)  01/27/16 250 lb (113.4 kg)      Studies/Labs Reviewed:   EKG:  EKG is ordered today.  The ekg ordered today demonstrates Afib at rate of 78 bpm  Recent Labs: 10/07/2016: NT-Pro BNP 918   Lipid Panel No results found for: CHOL, TRIG, HDL, CHOLHDL, VLDL, LDLCALC, LDLDIRECT  Additional studies/ records that were reviewed today include:   Echocardiogram: 10/21/16 Study Conclusions  - Left ventricle: The cavity size was normal. Wall thickness was   increased in a pattern of mild LVH. There was mild focal basal   hypertrophy of the septum. Systolic function was normal. The   estimated ejection fraction was in the range of 55% to 60%. Wall   motion was normal; there were no regional wall motion   abnormalities. - Aortic valve: A bioprosthesis was  present. - Mitral valve: Calcified annulus. - Left atrium: The atrium was severely dilated. - Pulmonary arteries: Systolic pressure was mildly increased. PA   peak pressure: 32 mm Hg (S).  Impressions:  - Normal LV systolic function; mild LVH; s/p AVR with normal mean    gradient and no AI; severe LAE; trace TR; mildly elevated   pulmonary pressure.  Echo 03/20/15 LV EF: 20% - 25%  ------------------------------------------------------------------- Indications: Z95.4 Status post Aortic Valve Replacement.  ------------------------------------------------------------------- History: PMH: Acquired from the patient and from the patient&'s chart. PMH: Atrial Fibrillation. Hypothyroidism. Murmur. DVT. Obstructive Sleep Apnea. Emphysema. Coronary Artery Disease. Chronic Kidney Disease. Risk factors: Hypertension. Hypercholesterolemia.  ------------------------------------------------------------------- Study Conclusions  - Left ventricle: The cavity size was normal. Wall thickness was increased in a pattern of mild LVH. Systolic function was severely reduced. The estimated ejection fraction was in the range of 20% to 25%. - Aortic valve: AV prosthesis opens well Peak and mean gradients through the valve are 15 and 9 mm Hg respectively. - Mitral valve: Calcified annulus. Mildly thickened leaflets . There was mild regurgitation. - Left atrium: The atrium was severely dilated. - Right ventricle: Systolic function was mildly reduced. - Right atrium: The atrium was mildly dilated. - Pulmonary arteries: PA peak pressure: 35 mm Hg (S).  Carotid US 5/63/14 RICA 97-02%; LICA absent  LHC 6/37/85 ([place]) LM: Ok LAD: Insignificant LCx: Normal RCA: Dist 70%   ASSESSMENT & PLAN:   1. Chronic exertional dyspnea - Seems multifactorial due to deconditioning, overweight and some degree of diastolic dysfunction. Discuss with primary cardiologist Dr. Acie Fredrickson. We will try daily dose of Lasix. Advise to lose weight and regular exercise. Low sodium diet. He was previously evaluated by [facility] pulmonologist.  CT of the chest as above. Will send referral to [facility] pulmonology per patient request.  2. Chronic diastolic CHF - EF  improved to 55-60% 09/2016 from 20-25% on 02/2015. Has grade 1 DD. Mild volume overload. Trial of daily diuretics as above.   3.  Aortic valve insufficiency s/p AVR - Normal mean gradient and no AI on echo 10/21/16.  4.  Persistant atrial fibrilaltion - Rate controlled. S/p clipping. Continue coreg and Eliquis. No bleeding issue.   5.  CAD with 70% dist RCA - As noted, he did not have adequate SVG for grafting. EKG without acute changes.   6. HTN - Stable and well controlled on current regimen.  7. HLD  - Continue statin.   8. OSA on CPAP  9. Ascending thoracic aorta has a measured diameter 4.4 x 4.3 cm. Recommend annual imaging followup by CTA or MRA    Medication Adjustments/Labs and Tests Ordered: Current medicines are reviewed at length with the patient today.  Concerns regarding medicines are outlined above.  Medication changes, Labs and Tests ordered today are listed in the Patient Instructions below. Patient Instructions  Medication Instructions:   START TAKING  LASIX 40 MG ONCE A DAY   START TAKING  POTASSIUM 10 MEQ ONCE A DAY   If you need a refill on your cardiac medications before your next appointment, please call your pharmacy.  Labwork: RETURN IN 3 WEEKS FOR BMP    Testing/Procedures: NONE ORDERED  TODAY    Follow-Up: WITH DR NAHSER IN 2 TO 3 MONTHS   You have been referred to [place]     Any Other Special Instructions Will Be Listed Below (If Applicable).  Jarrett Soho, [place]  10/26/2016 10:21 AM    [facility] [place], [place], [place]  [address] Phone: [phone]; Fax: [phone]

## 2016-10-26 ENCOUNTER — Ambulatory Visit (INDEPENDENT_AMBULATORY_CARE_PROVIDER_SITE_OTHER): Payer: Medicare Other | Admitting: Physician Assistant

## 2016-10-26 ENCOUNTER — Encounter: Payer: Self-pay | Admitting: Physician Assistant

## 2016-10-26 VITALS — BP 142/80 | HR 78 | Ht 72.0 in | Wt 284.5 lb

## 2016-10-26 DIAGNOSIS — Z952 Presence of prosthetic heart valve: Secondary | ICD-10-CM

## 2016-10-26 DIAGNOSIS — Z79899 Other long term (current) drug therapy: Secondary | ICD-10-CM | POA: Diagnosis not present

## 2016-10-26 DIAGNOSIS — I2510 Atherosclerotic heart disease of native coronary artery without angina pectoris: Secondary | ICD-10-CM | POA: Diagnosis not present

## 2016-10-26 DIAGNOSIS — I482 Chronic atrial fibrillation, unspecified: Secondary | ICD-10-CM

## 2016-10-26 DIAGNOSIS — I10 Essential (primary) hypertension: Secondary | ICD-10-CM

## 2016-10-26 DIAGNOSIS — I4891 Unspecified atrial fibrillation: Secondary | ICD-10-CM

## 2016-10-26 DIAGNOSIS — R0609 Other forms of dyspnea: Secondary | ICD-10-CM | POA: Diagnosis not present

## 2016-10-26 MED ORDER — FUROSEMIDE 40 MG PO TABS: 40.0000 mg | ORAL_TABLET | Freq: Every day | ORAL | 6 refills | Status: DC

## 2016-10-26 MED ORDER — POTASSIUM CHLORIDE ER 10 MEQ PO TBCR: 10.0000 meq | EXTENDED_RELEASE_TABLET | Freq: Every day | ORAL | 6 refills | Status: DC

## 2016-10-26 NOTE — Patient Instructions (Signed)
Medication Instructions:   START TAKING  LASIX 40 MG ONCE A DAY   START TAKING  POTASSIUM 10 MEQ ONCE A DAY   If you need a refill on your cardiac medications before your next appointment, please call your pharmacy.  Labwork: RETURN IN 3 WEEKS FOR BMP    Testing/Procedures: NONE ORDERED  TODAY    Follow-Up: WITH DR NAHSER IN 2 TO 3 MONTHS   [facility]     Any Other Special Instructions Will Be Listed Below (If Applicable).

## 2016-10-27 ENCOUNTER — Ambulatory Visit
Admission: RE | Admit: 2016-10-27 | Discharge: 2016-10-27 | Disposition: A | Discharge status: Home / Self Care | Payer: Medicare Other | Source: Ambulatory Visit | Attending: Family Medicine | Admitting: Family Medicine

## 2016-10-27 DIAGNOSIS — N644 Mastodynia: Secondary | ICD-10-CM

## 2016-11-16 ENCOUNTER — Other Ambulatory Visit: Payer: Self-pay

## 2016-12-01 ENCOUNTER — Encounter: Payer: Self-pay | Admitting: Physician Assistant

## 2016-12-01 LAB — TSH
TSH: 0.16
TSH: 11.17
TSH: 25.7

## 2016-12-01 LAB — ESTIMATED GFR
EGFR (Non-African Amer.): 35
EGFR (Non-African Amer.): 42
EGFR (Non-African Amer.): 43
GFR CALC NON AF AMER: 34

## 2016-12-01 LAB — BRAIN NATRIURETIC PEPTIDE: B Natriuretic Peptide: 119

## 2016-12-01 LAB — T4, FREE: T4 TOTAL: 7.2

## 2016-12-03 ENCOUNTER — Encounter: Payer: Self-pay | Admitting: Physician Assistant

## 2016-12-03 DIAGNOSIS — I7789 Other specified disorders of arteries and arterioles: Secondary | ICD-10-CM | POA: Insufficient documentation

## 2016-12-03 DIAGNOSIS — I5032 Chronic diastolic (congestive) heart failure: Secondary | ICD-10-CM | POA: Insufficient documentation

## 2016-12-03 DIAGNOSIS — R911 Solitary pulmonary nodule: Secondary | ICD-10-CM | POA: Insufficient documentation

## 2016-12-06 ENCOUNTER — Ambulatory Visit: Payer: Self-pay | Admitting: Pulmonary Disease

## 2016-12-08 ENCOUNTER — Encounter: Payer: Self-pay | Admitting: Pulmonary Disease

## 2016-12-08 ENCOUNTER — Ambulatory Visit (INDEPENDENT_AMBULATORY_CARE_PROVIDER_SITE_OTHER): Payer: Medicare Other | Admitting: Pulmonary Disease

## 2016-12-08 DIAGNOSIS — E669 Obesity, unspecified: Secondary | ICD-10-CM

## 2016-12-08 DIAGNOSIS — J449 Chronic obstructive pulmonary disease, unspecified: Secondary | ICD-10-CM | POA: Diagnosis not present

## 2016-12-08 DIAGNOSIS — IMO0001 Reserved for inherently not codable concepts without codable children: Secondary | ICD-10-CM

## 2016-12-08 DIAGNOSIS — K219 Gastro-esophageal reflux disease without esophagitis: Secondary | ICD-10-CM

## 2016-12-08 DIAGNOSIS — R911 Solitary pulmonary nodule: Secondary | ICD-10-CM

## 2016-12-08 DIAGNOSIS — J986 Disorders of diaphragm: Secondary | ICD-10-CM | POA: Diagnosis not present

## 2016-12-08 MED ORDER — TIOTROPIUM BROMIDE-OLODATEROL 2.5-2.5 MCG/ACT IN AERS: 2.0000 | INHALATION_SPRAY | Freq: Every day | RESPIRATORY_TRACT | 0 refills | Status: DC

## 2016-12-08 MED ORDER — OMEPRAZOLE 20 MG PO CPDR
20.0000 mg | DELAYED_RELEASE_CAPSULE | Freq: Every day | ORAL | 11 refills | Status: DC
Start: 2016-12-08 — End: 2018-07-03

## 2016-12-08 NOTE — Assessment & Plan Note (Signed)
Clearly contributing to the severity of his dyspnea. He was advised today to measure his weight every day, keep a food diary, and drink a glass of water prior to meals.

## 2016-12-08 NOTE — Assessment & Plan Note (Signed)
Uncertain etiology, likely contributing to his dyspnea. Weight gain certainly makes this problem worse.  There is no findings on CT scanning of his chest to suggest a malignancy or cause of the problem.  Plan: Weight loss was encouraged.

## 2016-12-08 NOTE — Assessment & Plan Note (Signed)
Right upper lobe pulmonary nodule seen in the 2017 CT chest. Of note, he has a history of any pulmonary nodules over the years. My suspicion is that this was unlike the previous ones will be benign. Given his need for recurrent CT scans of the chest to monitor his aneurysm I will not order a repeat CT for now. When we see him back in 6 months will make sure he's had repeat imaging and then can order a test that point if necessary.

## 2016-12-08 NOTE — Progress Notes (Signed)
Subjective:    Patient ID: [name] [name], male    DOB: 1932/05/07, 83 y.o.   MRN: [id number]  Synopsis: Evaluated in 2018 for shortness of breath, found to have COPD based on historical lung function testing from [facility]. He also has a history of aortic valve disease status post aortic valve replacement in 2016, atrial fibrillation, DVT.  HPI Chief Complaint  Patient presents with  . Follow-up    rov per heartcare- last seen 03/2014.  pt c/o worsening sob, prod cough with white/clear mucus, pnd.  S/s worsening X1 year.      [name] [name] is back to see me for the first time in 3 years at the recommendation of his cardiology PA Bhagat.  He tells me that for the last 18 months ever since his heart surgery he's been having progressive shortness of breath. Specifically, he says that any exertion will cause chest tightness, wheezing and shortness of breath. He's limited by bilateral knee pain as well and recently had a fall with a patellar fracture. He says he was told to participate in physical therapy but he refused to do this. In the last year he's been 30 pounds. He states that there is no environmental triggers that'll make his breathing worse such as smoke, dust, fumes, or weather changes.  He says he wheezes frequently. He often coughs and produces clear mucus.  He says that he has significant heartburn symptoms. He said that he previously took a pill for this which controlled the symptoms well. However he is not taking anything for it now.  Past Medical History:  Diagnosis Date  . Anxiety   . Aortic valve insufficiency   . Arthritis    "wee bit; knees, elbows" (10/15/2014)  . Atrial fibrillation ([place])   . Cardiomyopathy ([place])   . Chronic kidney disease    "down to ~ 1/2 of their regular use; I see kidney dr. in [place]" (10/15/2014)  . Complication of anesthesia    unsure of complications but pt. states there were complications  . COPD (chronic obstructive pulmonary disease) ([place])   .  Coronary artery disease   . Decreased testosterone level   . Depression   . Difficult intubation    difficult intubation 12/03/09 and 03/27/10 ([facility]); glidescope used 03/27/10  . DVT (deep venous thrombosis) ([place]) ~ 2013   "BLE"  . Dysrhythmia    A. Fib  . Emphysema of lung ([place])   . Enlarged prostate   . Falls frequently    > 20 times in the last year/notes 10/15/2014  . GERD (gastroesophageal reflux disease)   . Heart murmur   . History of blood transfusion 1960   "lots; related to an accident"  . History of echocardiogram    post AVR >> Echo 7/16:  Mild LVH, EF 20-25%, AVR ok (peak 15 mmHg, mean 9 mmHg), MAC, mild MR, severe LAE, mild reduced RVSF, mild RAE, PASP 35 mmHg  . Hypercholesteremia   . Hypertension   . Hypothyroidism   . OSA (obstructive sleep apnea)    "suppose to wear mask; I throw it off in my sleep" (10/15/2014)  . Plantar fasciitis   . Right fibular fracture 10/15/2014  . Seasonal allergies   . Shortness of breath dyspnea   . Syncope 10/15/2014  . Syncope and collapse "several times"  . Urine frequency   . Urine incontinence   . Varicose veins       Review of Systems  Constitutional: Negative for chills, fatigue and fever.  HENT: Negative for postnasal drip, sinus pain and sinus pressure.   Respiratory: Positive for cough, shortness of breath and wheezing.   Cardiovascular: Positive for chest pain. Negative for palpitations and leg swelling.       Objective:   Physical Exam  Vitals:   12/08/16 1232  BP: 138/80  Pulse: 66  SpO2: 95%  Weight: 290 lb (131.5 kg)  Height: 6' (1.829 m)   RA  Ambulated 150 feet on RA and O2 saturation dropped no lower than 93%, he refused to walk further  Gen: obese, chronically ill appearing, no acute distress HENT: NCAT, OP clear, neck supple without masses Eyes: PERRL, EOMi Lymph: no cervical lymphadenopathy PULM: Some wheezing bilaterally, poor air movement CV: RRR, no mgr, no JVD GI: BS+, soft, nontender, no  hsm Derm: no rash or skin breakdown MSK: normal bulk and tone Neuro: A&Ox4, CN II-XII intact, strength 5/5 in all 4 extremities Psyche: normal mood and affect  2017 CT chest images independently reviewed showing a pulmonary nodule the right upper lobe with some surrounding groundglass, there is some subsegmental atelectasis and rounded atelectasis in the right lung is well worse in the base, there is a significant elevation of the right hemidiaphragm.  Records from cardiology reviewed were he is cared for for his systolic heart failure.     Assessment & Plan:  Pulmonary nodule Right upper lobe pulmonary nodule seen in the 2017 CT chest. Of note, he has a history of any pulmonary nodules over the years. My suspicion is that this was unlike the previous ones will be benign. Given his need for recurrent CT scans of the chest to monitor his aneurysm I will not order a repeat CT for now. When we see him back in 6 months will make sure he's had repeat imaging and then can order a test that point if necessary.  Obesity Clearly contributing to the severity of his dyspnea. He was advised today to measure his weight every day, keep a food diary, and drink a glass of water prior to meals.  GERD (gastroesophageal reflux disease) Poorly controlled, due to obesity and contributing to his cough with mucus production.  Plan: Omeprazole daily  Elevated hemidiaphragm Uncertain etiology, likely contributing to his dyspnea. Weight gain certainly makes this problem worse.  There is no findings on CT scanning of his chest to suggest a malignancy or cause of the problem.  Plan: Weight loss was encouraged.  COPD (chronic obstructive pulmonary disease) with chronic bronchitis ([place]) The lung function test from [facility] (I think from 2016) reviewed today showing a ratio of 62, FEV1 2.17 L, total lung capacity 7.47 L (77% predicted), DLCO 23.8 (79% predicted)  Given this, we will treat his COPD as his symptoms of  cough, shortness of breath, and chest congestion certainly fit with this diagnosis.  Plan: Start Stiolto 2 puffs daily, samples given Follow-up in 4-6 weeks with our nurse practitioner to see how he is doing on this medicine    Current Outpatient Prescriptions:  .  amLODipine (NORVASC) 2.5 MG tablet, Take 2 tablets (5 mg total) by mouth daily., Disp: 30 tablet, Rfl: 11 .  atorvastatin (LIPITOR) 10 MG tablet, Take 1 tablet (10 mg total) by mouth daily., Disp: 30 tablet, Rfl: 0 .  buPROPion (WELLBUTRIN XL) 300 MG 24 hr tablet, Take 1 tablet (300 mg total) by mouth daily., Disp: 30 tablet, Rfl: 1 .  busPIRone (BUSPAR) 15 MG tablet, Take 1 tablet (15 mg total) by mouth 2 (two)  times daily., Disp: 60 tablet, Rfl: 1 .  carvedilol (COREG) 25 MG tablet, Take 1 tablet (25 mg total) by mouth 2 (two) times daily., Disp: 180 tablet, Rfl: 0 .  clonazePAM (KLONOPIN) 0.5 MG tablet, Take 1 tablet (0.5 mg total) by mouth at bedtime., Disp: 30 tablet, Rfl: 0 .  dutasteride (AVODART) 0.5 MG capsule, Take 1 capsule (0.5 mg total) by mouth daily., Disp: 30 capsule, Rfl: 0 .  ELIQUIS 2.5 MG TABS tablet, TAKE 1 TABLET BY MOUTH TWICE A DAY, Disp: 60 tablet, Rfl: 8 .  escitalopram (LEXAPRO) 20 MG tablet, Take 1 tablet (20 mg total) by mouth daily., Disp: 30 tablet, Rfl: 1 .  folic acid (FOLVITE) 1 MG tablet, Take 1 tablet (1 mg total) by mouth daily., Disp: 30 tablet, Rfl: 1 .  furosemide (LASIX) 40 MG tablet, Take 1 tablet (40 mg total) by mouth daily., Disp: 30 tablet, Rfl: 6 .  lamoTRIgine (LAMICTAL) 100 MG tablet, Take 1 tablet (100 mg total) by mouth 2 (two) times daily. Breakfast and dinner, Disp: 60 tablet, Rfl: 1 .  levothyroxine (SYNTHROID, LEVOTHROID) 175 MCG tablet, Take 175 mcg by mouth daily before breakfast., Disp: , Rfl:  .  mirtazapine (REMERON) 7.5 MG tablet, Take 7.5 mg by mouth daily., Disp: , Rfl:  .  potassium chloride (K-DUR) 10 MEQ tablet, Take 1 tablet (10 mEq total) by mouth daily., Disp: 30  tablet, Rfl: 6 .  omeprazole (PRILOSEC) 20 MG capsule, Take 1 capsule (20 mg total) by mouth daily., Disp: 30 capsule, Rfl: 11 .  Tiotropium Bromide-Olodaterol (STIOLTO RESPIMAT) 2.5-2.5 MCG/ACT AERS, Inhale 2 puffs into the lungs daily., Disp: 2 Inhaler, Rfl: 0

## 2016-12-08 NOTE — Assessment & Plan Note (Signed)
Poorly controlled, due to obesity and contributing to his cough with mucus production.  Plan: Omeprazole daily

## 2016-12-08 NOTE — Patient Instructions (Addendum)
Your lung function testing from 2016 showed that you have COPD: For this I want you to start taking Stiolto 2 puffs daily no matter how you feel  I also believe that you have acid reflux contributing to your cough, so I want to take omeprazole 1 pill daily  It would really help that he could lose weight through diet and exercise  The following behaviors have been associated with weight loss: Weigh yourself daily Write down everything you eat Drink a glass of water prior to eating a meal  We will see you back in 4-6 weeks with our nurse practitioner to make sure you doing okay on the new medicine

## 2016-12-08 NOTE — Assessment & Plan Note (Addendum)
The lung function test from [facility] (I think from 2016) reviewed today showing a ratio of 62, FEV1 2.17 L, total lung capacity 7.47 L (77% predicted), DLCO 23.8 (79% predicted)  Given this, we will treat his COPD as his symptoms of cough, shortness of breath, and chest congestion certainly fit with this diagnosis.  Plan: Start Stiolto 2 puffs daily, samples given Follow-up in 4-6 weeks with our nurse practitioner to see how he is doing on this medicine

## 2016-12-23 ENCOUNTER — Other Ambulatory Visit: Payer: Self-pay

## 2016-12-23 MED ORDER — APIXABAN 2.5 MG PO TABS: 2.5000 mg | ORAL_TABLET | Freq: Two times a day (BID) | ORAL | 3 refills | Status: DC

## 2017-01-04 ENCOUNTER — Other Ambulatory Visit: Payer: Self-pay | Admitting: *Deleted

## 2017-01-04 ENCOUNTER — Encounter: Payer: Self-pay | Admitting: Cardiovascular Disease

## 2017-01-04 DIAGNOSIS — I10 Essential (primary) hypertension: Secondary | ICD-10-CM

## 2017-01-04 MED ORDER — AMLODIPINE BESYLATE 2.5 MG PO TABS: 5.0000 mg | ORAL_TABLET | Freq: Every day | ORAL | 2 refills | Status: DC

## 2017-01-04 MED ORDER — CARVEDILOL 25 MG PO TABS: 25.0000 mg | ORAL_TABLET | Freq: Two times a day (BID) | ORAL | 2 refills | Status: DC

## 2017-01-05 ENCOUNTER — Encounter: Payer: Self-pay | Admitting: Physician Assistant

## 2017-01-05 ENCOUNTER — Ambulatory Visit: Payer: Self-pay | Admitting: Acute Care

## 2017-01-07 ENCOUNTER — Encounter: Payer: Self-pay | Admitting: Physician Assistant

## 2017-01-07 DIAGNOSIS — R7303 Prediabetes: Secondary | ICD-10-CM | POA: Insufficient documentation

## 2017-01-07 DIAGNOSIS — J189 Pneumonia, unspecified organism: Secondary | ICD-10-CM | POA: Insufficient documentation

## 2017-01-13 ENCOUNTER — Ambulatory Visit: Payer: Medicare Other | Admitting: Physician Assistant

## 2017-01-21 ENCOUNTER — Encounter: Payer: Self-pay | Admitting: Cardiovascular Disease

## 2017-01-21 ENCOUNTER — Ambulatory Visit (INDEPENDENT_AMBULATORY_CARE_PROVIDER_SITE_OTHER): Payer: Medicare Other | Admitting: Cardiovascular Disease

## 2017-01-21 VITALS — BP 126/88 | HR 89 | Ht 72.0 in | Wt 297.0 lb

## 2017-01-21 DIAGNOSIS — I10 Essential (primary) hypertension: Secondary | ICD-10-CM

## 2017-01-21 DIAGNOSIS — I482 Chronic atrial fibrillation, unspecified: Secondary | ICD-10-CM

## 2017-01-21 DIAGNOSIS — R0609 Other forms of dyspnea: Secondary | ICD-10-CM | POA: Diagnosis not present

## 2017-01-21 DIAGNOSIS — I2510 Atherosclerotic heart disease of native coronary artery without angina pectoris: Secondary | ICD-10-CM

## 2017-01-21 DIAGNOSIS — I5022 Chronic systolic (congestive) heart failure: Secondary | ICD-10-CM

## 2017-01-21 DIAGNOSIS — Z952 Presence of prosthetic heart valve: Secondary | ICD-10-CM | POA: Diagnosis not present

## 2017-01-21 NOTE — Progress Notes (Signed)
Cardiology Office Note   Date:  01/21/2017   ID:  [name] [name], DOB [birth year], MRN [id number]  PCP:  Aura Dials, MD  Cardiologist:   Mertie Moores, MD   Chief Complaint  Patient presents with  . Follow-up    Chronic systolic HF   Problem List: 1. Aortic insufficiency- he has severe AI.  - s/p AVR   2. Hyperlipidemia  3. HTN-  4. Obstructive sleep apnea 5. AAA - 2007 , [name] [name] 6. Hypothyroidism 7. COPD  8. Atrial fib:  -    History of Present Illness: [name] [name] is a 83 y.o. male who presents for evaluation for possible aortic valve replacement . He has been followed by the cardiologist at [facility] for years.   He wants to have his AVR here. Has chronic shortness of breath.  Gets worn out taking a load of laundry up the stairs.  Has had recent echos at [facility].  (have been incorporated in our system )   His overall condition has declined over the past 6 months.  Has some balance issues.  Has orthostatic hypotension.   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   Can walk 1/4 mile  Can climb 2 flights of stairs.  No PND or orthopnea.   No syncope.    February 05, 2015: He has been seen by Dr. Servando Snare.  The plan is to consider AVR with possible MAZE  next week. He had an echo and cath at [facility] in January   Sept. 8, 2016:  Has had AVR  Doing ok  Oct. 6, 2016:  Pt is s/p AVR,  Had CABG. , atrial fib Persistently elevated BP     Occasionally eats ham and cheese for lunch. Eats frozen dinners for dinner almost every night.    Eats at [facility] if he does not eat the [employer]. Also goes to [employer] once a week.   Has some chest wall pain   November 03, 2015  Still very weak. Has DOE with any exertion,  We have tried lasix but that did not seem to help  Also has orthostatic hypotension .   Just came from the neurologist office.   Will be doing PT to try to help  with this unsteadiness.   Has been watching his salt.   Jan 21, 2017:  [name] [name] is seen back for follow up visit Has gained some weight .   Has been trying to watch his diet. Is not able to exercise .   Is hardly able to walk due to DOE  No CP .   Has COPD  ( by report)  ,  Atrial fib ,  LV function is normal   Past Medical History:  Diagnosis Date  . Anxiety   . Aortic valve insufficiency   . Arthritis    "wee bit; knees, elbows" (10/15/2014)  . Atrial fibrillation ([place])   . Cardiomyopathy ([place])   . Chronic kidney disease    "down to ~ 1/2 of their regular use; I see kidney dr. in [place]" (10/15/2014)  . Complication of anesthesia    unsure of complications but pt. states there were complications  . COPD (chronic obstructive pulmonary disease) ([place])   . Coronary artery disease   . Decreased testosterone level   . Depression   . Difficult intubation    difficult intubation 12/03/09 and 03/27/10 ([facility]);  glidescope used 03/27/10  . DVT (deep venous thrombosis) ([place]) ~ 2013   "BLE"  . Dysrhythmia    A. Fib  . Emphysema of lung ([place])   . Enlarged prostate   . Falls frequently    > 20 times in the last year/notes 10/15/2014  . GERD (gastroesophageal reflux disease)   . Heart murmur   . History of blood transfusion 1960   "lots; related to an accident"  . History of echocardiogram    post AVR >> Echo 7/16:  Mild LVH, EF 20-25%, AVR ok (peak 15 mmHg, mean 9 mmHg), MAC, mild MR, severe LAE, mild reduced RVSF, mild RAE, PASP 35 mmHg  . Hypercholesteremia   . Hypertension   . Hypothyroidism   . OSA (obstructive sleep apnea)    "suppose to wear mask; I throw it off in my sleep" (10/15/2014)  . Plantar fasciitis   . Right fibular fracture 10/15/2014  . Seasonal allergies   . Shortness of breath dyspnea   . Syncope 10/15/2014  . Syncope and collapse "several times"  . Urine frequency   . Urine incontinence   . Varicose veins     Past Surgical History:  Procedure Laterality Date   . ABDOMINAL AORTIC ANEURYSM REPAIR  01/2006; 03/10/2006   Archie Endo 01/12/2011  . AORTIC VALVE REPLACEMENT N/A 02/11/2015   Procedure: AORTIC VALVE REPLACEMENT (AVR);  Surgeon: Grace Isaac, MD;  Location: [facility];  Service: Open Heart Surgery;  Laterality: N/A;  . BRAIN SURGERY  1960 X 4   "S/P got my skull busted"; pt. states removed internal carotid artery  . CARDIAC CATHETERIZATION  1990's X 1; 09/2014   no stents  . CLIPPING OF ATRIAL APPENDAGE N/A 02/11/2015   Procedure: CLIPPING OF ATRIAL APPENDAGE;  Surgeon: Grace Isaac, MD;  Location: [facility];  Service: Open Heart Surgery;  Laterality: N/A;  . ERCP  11/2009   Archie Endo 12/05/2009  . gall stone    . HERNIA REPAIR    . LAPAROSCOPIC CHOLECYSTECTOMY  08/2009   w/IOC/notes 09/18/2009  . LAPAROSCOPIC INCISIONAL / UMBILICAL / VENTRAL HERNIA REPAIR  02/2010   VHR w/mesh/notes 03/28/2010  . NOSE SURGERY    . TEE WITHOUT CARDIOVERSION N/A 02/11/2015   Procedure: TRANSESOPHAGEAL ECHOCARDIOGRAM (TEE);  Surgeon: Grace Isaac, MD;  Location: [facility];  Service: Open Heart Surgery;  Laterality: N/A;  . TONSILLECTOMY       Current Outpatient Prescriptions  Medication Sig Dispense Refill  . amLODipine (NORVASC) 2.5 MG tablet Take 2 tablets (5 mg total) by mouth daily. 180 tablet 2  . apixaban (ELIQUIS) 2.5 MG TABS tablet Take 1 tablet (2.5 mg total) by mouth 2 (two) times daily. 180 tablet 3  . atorvastatin (LIPITOR) 10 MG tablet Take 1 tablet (10 mg total) by mouth daily. 30 tablet 0  . buPROPion (WELLBUTRIN XL) 300 MG 24 hr tablet Take 1 tablet (300 mg total) by mouth daily. 30 tablet 1  . busPIRone (BUSPAR) 15 MG tablet Take 1 tablet (15 mg total) by mouth 2 (two) times daily. 60 tablet 1  . carvedilol (COREG) 25 MG tablet Take 1 tablet (25 mg total) by mouth 2 (two) times daily. 180 tablet 2  . clonazePAM (KLONOPIN) 0.5 MG tablet Take 1 tablet (0.5 mg total) by mouth at bedtime. 30 tablet 0  . dutasteride (AVODART) 0.5 MG capsule Take 1 capsule  (0.5 mg total) by mouth daily. 30 capsule 0  . escitalopram (LEXAPRO) 20 MG tablet Take 1 tablet (20  mg total) by mouth daily. 30 tablet 1  . folic acid (FOLVITE) 1 MG tablet Take 1 tablet (1 mg total) by mouth daily. 30 tablet 1  . furosemide (LASIX) 40 MG tablet Take 1 tablet (40 mg total) by mouth daily. 30 tablet 6  . lamoTRIgine (LAMICTAL) 100 MG tablet Take 1 tablet (100 mg total) by mouth 2 (two) times daily. Breakfast and dinner 60 tablet 1  . levothyroxine (SYNTHROID, LEVOTHROID) 175 MCG tablet Take 175 mcg by mouth daily before breakfast.    . mirtazapine (REMERON) 7.5 MG tablet Take 7.5 mg by mouth daily.    Marland Kitchen omeprazole (PRILOSEC) 20 MG capsule Take 1 capsule (20 mg total) by mouth daily. 30 capsule 11  . potassium chloride (K-DUR) 10 MEQ tablet Take 1 tablet (10 mEq total) by mouth daily. 30 tablet 6   No current facility-administered medications for this visit.     Allergies:   Patient has no known allergies.    Social History:  The patient  reports that he quit smoking about 42 years ago. His smoking use included Cigarettes. He has a 44.00 pack-year smoking history. He has never used smokeless tobacco. He reports that he drinks alcohol. He reports that he does not use drugs.   Family History:  The patient's family history includes Breast cancer in his sister; Cancer in his sister; Depression in his sister; Diabetes in his mother; Heart disease in his father; Kidney disease in his father; Kidney failure in his brother; Other in his sister; Rheum arthritis in his father and mother; Stroke in his sister.    ROS:  Please see the history of present illness.    Review of Systems: Constitutional:  denies fever, chills, diaphoresis, appetite change and fatigue.  HEENT: denies photophobia, eye pain, redness, hearing loss, ear pain, congestion, sore throat, rhinorrhea, sneezing, neck pain, neck stiffness and tinnitus.  Respiratory: denies SOB, DOE, cough, chest tightness, and  wheezing.  Cardiovascular: denies chest pain, palpitations and leg swelling.  Gastrointestinal: denies nausea, vomiting, abdominal pain, diarrhea, constipation, blood in stool.  Genitourinary: denies dysuria, urgency, frequency, hematuria, flank pain and difficulty urinating.  Musculoskeletal: denies  myalgias, back pain, joint swelling, arthralgias and gait problem.   Skin: denies pallor, rash and wound.  Neurological: denies dizziness, seizures, syncope, weakness, light-headedness, numbness and headaches.   Hematological: denies adenopathy, easy bruising, personal or family bleeding history.  Psychiatric/ Behavioral: denies suicidal ideation, mood changes, confusion, nervousness, sleep disturbance and agitation.       All other systems are reviewed and negative.    PHYSICAL EXAM: VS:  BP 126/88   Pulse 89   Ht 6' (1.829 m)   Wt 297 lb (134.7 kg)   SpO2 94%   BMI 40.28 kg/m  , BMI Body mass index is 40.28 kg/m. GEN: Well nourished, well developed, in no acute distress  HEENT: normal  Neck: no JVD, carotid bruits, or masses Cardiac: Irreg. Irreg. ; no murmurs, rubs, or gallops,no edema  Respiratory:  clear to auscultation bilaterally, normal work of breathing GI: soft, nontender, nondistended, + BS MS: no deformity or atrophy  Skin: warm and dry, no rash Neuro:  Strength and sensation are intact Psych: normal   EKG:  EKG is not ordered today. The ekg ordered Feb. 17, 2016  demonstrates atrial fib with controlled rate.    Recent Labs: 06/03/2016: B Natriuretic Peptide 119 10/07/2016: NT-Pro BNP 918    Lipid Panel    Component Value Date/Time   CHOL 118  10/10/2015   TRIG 195 (A) 10/10/2015   HDL 29 (A) 10/10/2015   LDLCALC 50 10/10/2015      Wt Readings from Last 3 Encounters:  01/21/17 297 lb (134.7 kg)  12/08/16 290 lb (131.5 kg)  10/26/16 284 lb 8 oz (129 kg)      Other studies Reviewed: Additional studies/ records that were reviewed today include:  . Review of the above records demonstrates:    ASSESSMENT AND PLAN:  1. Dyspnea with exertion: Mr. Aldaco presents with continued dyspnea with exertion. I suspect a lot of this is due to weight gain. He has a history of chronic systolic congestive heart failure but his most recent echocardiogram reveals normal left ventricle systolic motion. He has mildly elevated point pressures with estimated PA pressure of 32. He has a bioprosthetic aortic valve which is functioning normally. I have encouraged him to exercise on a regular basis.  1. Aortic insufficiency :  S/p AVR     2. Hyperlipidemia  3. HTN -   Still eats lots of salt      on carvedilol 25 mg twice a day.   4. Obstructive sleep apnea  5. AAA - 2007 , Gae Gallop  6. Hypothyroidism  7. COPD  - followed by Dr. Lake Bells   8. Atrial fib:  -  He is in chronic atrial fibrillation. He's currently on Eliquis.    Rate is well controlled.   9. CAD :  Has disease of the RCA.  Was not bypassed due to inability of harvest adequate SVG for grafting .   10.  stage III chronic kidney disease.   Current medicines are reviewed at length with the patient today.  The patient does not have concerns regarding medicines.  The following changes have been made:  no change  Labs/ tests ordered today include:  No orders of the defined types were placed in this encounter.    Disposition:   FU with me in 3 months . Will have a nurse visit in 1 month.      Signed, Mertie Moores, MD  01/21/2017 2:29 PM    [address], [place], [place]  [address] Phone: [phone]; Fax: [phone]

## 2017-01-21 NOTE — Patient Instructions (Addendum)
Medication Instructions:    Your physician recommends that you continue on your current medications as directed. Please refer to the Current Medication list given to you today.  - If you need a refill on your cardiac medications before your next appointment, please call your pharmacy.   Labwork:  BMET today  Testing/Procedures:  None ordered  Follow-Up:  Your physician wants you to follow-up in: 6 months with Dr. [name].  You will receive a reminder letter in the mail two months in advance. If you don't receive a letter, please call our office to schedule the follow-up appointment.  Thank you for choosing [facility]!!

## 2017-01-22 LAB — BASIC METABOLIC PANEL
BUN/Creatinine Ratio: 13 (ref 10–24)
BUN: 23 mg/dL (ref 8–27)
CALCIUM: 9 mg/dL (ref 8.6–10.2)
CHLORIDE: 101 mmol/L (ref 96–106)
CO2: 27 mmol/L (ref 18–29)
Creatinine, Ser: 1.79 mg/dL — ABNORMAL HIGH (ref 0.76–1.27)
GFR calc non Af Amer: 34 mL/min/{1.73_m2} — ABNORMAL LOW (ref >59)
GFR, EST AFRICAN AMERICAN: 40 mL/min/{1.73_m2} — AB (ref >59)
Glucose: 94 mg/dL (ref 65–99)
POTASSIUM: 5 mmol/L (ref 3.5–5.2)
Sodium: 142 mmol/L (ref 134–144)

## 2017-01-27 ENCOUNTER — Ambulatory Visit: Payer: Medicare Other | Admitting: Physician Assistant

## 2017-01-27 ENCOUNTER — Telehealth: Payer: Self-pay | Admitting: Physician Assistant

## 2017-01-27 DIAGNOSIS — Z0289 Encounter for other administrative examinations: Secondary | ICD-10-CM

## 2017-01-27 NOTE — Telephone Encounter (Signed)
Dustin from front office came back and said Rob called back and said they are both sick.  Tried calling Rob back left message to call office to see if they need to be seen or if there is something else we could do. 

## 2017-01-27 NOTE — Telephone Encounter (Signed)
Today is patient's second "no show" to establish care. Can we call patient or patient's HCPOA to check on him? Per most recent cardiology note, he was recently at a SNF, unsure if he is still there.

## 2017-01-27 NOTE — Telephone Encounter (Signed)
Spoke to pt's [name], told him his parents had appointments today to see [name] [name] and this is the second time they had not shown. We are concerned and wondered if you had heard from them. Also Cardiology note said your father was in a nursing facility recently. Rob said father was not in a nursing facility that was old. Rob said he will check on them. I asked him to please call me back and let us know that they are okay. Rob verbalized understanding.

## 2017-01-28 NOTE — Telephone Encounter (Signed)
Spoke to pt, told him I was calling to check on him and his wife due to missing appointments yesterday and son said you were both sick. Pt said yes, has a cold, but he is better today he is up and about and wife was having a Bi-Polar episode and is in bed yet. Asked if she was okay he said yes. Asked him if I can do anything for him? Pt said need to reschedule appointments. Told him I will have scheduling call him but needs to make sure keeps appointments. Pt verbalized understanding.

## 2017-02-23 ENCOUNTER — Telehealth: Payer: Self-pay | Admitting: Pulmonary Disease

## 2017-02-23 MED ORDER — TIOTROPIUM BROMIDE-OLODATEROL 2.5-2.5 MCG/ACT IN AERS: 2.0000 | INHALATION_SPRAY | Freq: Every day | RESPIRATORY_TRACT | 11 refills | Status: DC

## 2017-02-23 NOTE — Telephone Encounter (Signed)
Pt requesting refill on stiolto respimat.  This has been sent to preferred pharmacy.  Nothing further needed.

## 2017-04-26 NOTE — Telephone Encounter (Signed)
Patient and his wife wants to establish care with [facility].  Okay to sched?  Ty,  -LL

## 2017-06-22 ENCOUNTER — Other Ambulatory Visit: Payer: Self-pay | Admitting: Physician Assistant

## 2017-08-17 ENCOUNTER — Encounter (HOSPITAL_COMMUNITY): Payer: Self-pay | Admitting: *Deleted

## 2017-08-17 ENCOUNTER — Other Ambulatory Visit: Payer: Self-pay

## 2017-08-17 ENCOUNTER — Emergency Department (HOSPITAL_COMMUNITY)
Admission: EM | Admit: 2017-08-17 | Discharge: 2017-08-18 | Disposition: A | Discharge status: Home / Self Care | Payer: Medicare Other | Attending: Emergency Medicine | Admitting: Emergency Medicine

## 2017-08-17 DIAGNOSIS — I2510 Atherosclerotic heart disease of native coronary artery without angina pectoris: Secondary | ICD-10-CM | POA: Diagnosis not present

## 2017-08-17 DIAGNOSIS — I5032 Chronic diastolic (congestive) heart failure: Secondary | ICD-10-CM | POA: Diagnosis not present

## 2017-08-17 DIAGNOSIS — J449 Chronic obstructive pulmonary disease, unspecified: Secondary | ICD-10-CM | POA: Insufficient documentation

## 2017-08-17 DIAGNOSIS — Z79899 Other long term (current) drug therapy: Secondary | ICD-10-CM | POA: Insufficient documentation

## 2017-08-17 DIAGNOSIS — Z952 Presence of prosthetic heart valve: Secondary | ICD-10-CM | POA: Diagnosis not present

## 2017-08-17 DIAGNOSIS — Z7901 Long term (current) use of anticoagulants: Secondary | ICD-10-CM | POA: Insufficient documentation

## 2017-08-17 DIAGNOSIS — R609 Edema, unspecified: Secondary | ICD-10-CM | POA: Insufficient documentation

## 2017-08-17 DIAGNOSIS — N189 Chronic kidney disease, unspecified: Secondary | ICD-10-CM | POA: Diagnosis not present

## 2017-08-17 DIAGNOSIS — E039 Hypothyroidism, unspecified: Secondary | ICD-10-CM | POA: Insufficient documentation

## 2017-08-17 DIAGNOSIS — I130 Hypertensive heart and chronic kidney disease with heart failure and stage 1 through stage 4 chronic kidney disease, or unspecified chronic kidney disease: Secondary | ICD-10-CM | POA: Insufficient documentation

## 2017-08-17 LAB — CBC
HEMATOCRIT: 40.6 % (ref 39.0–52.0)
Hemoglobin: 12.8 g/dL — ABNORMAL LOW (ref 13.0–17.0)
MCH: 31 pg (ref 26.0–34.0)
MCHC: 31.5 g/dL (ref 30.0–36.0)
MCV: 98.3 fL (ref 78.0–100.0)
PLATELETS: 192 10*3/uL (ref 150–400)
RBC: 4.13 MIL/uL — ABNORMAL LOW (ref 4.22–5.81)
RDW: 14.9 % (ref 11.5–15.5)
WBC: 7.5 10*3/uL (ref 4.0–10.5)

## 2017-08-17 LAB — BASIC METABOLIC PANEL
ANION GAP: 9 (ref 5–15)
BUN: 23 mg/dL — ABNORMAL HIGH (ref 6–20)
CALCIUM: 8.8 mg/dL — AB (ref 8.9–10.3)
CO2: 26 mmol/L (ref 22–32)
CREATININE: 1.79 mg/dL — AB (ref 0.61–1.24)
Chloride: 104 mmol/L (ref 101–111)
GFR, EST AFRICAN AMERICAN: 39 mL/min — AB (ref >60)
GFR, EST NON AFRICAN AMERICAN: 33 mL/min — AB (ref >60)
Glucose, Bld: 105 mg/dL — ABNORMAL HIGH (ref 65–99)
Potassium: 4.3 mmol/L (ref 3.5–5.1)
SODIUM: 139 mmol/L (ref 135–145)

## 2017-08-17 LAB — PROTIME-INR
INR: 1.14
Prothrombin Time: 14.5 seconds (ref 11.4–15.2)

## 2017-08-17 MED ORDER — DOXYCYCLINE HYCLATE 100 MG PO CAPS: 100.0000 mg | ORAL_CAPSULE | Freq: Two times a day (BID) | ORAL | 0 refills | Status: DC

## 2017-08-17 NOTE — ED Notes (Signed)
Please note: Pt's son left at 20:11 and stated that his father is "very deaf" and may not hear his name being called. Pt is currently located in front of the bathrooms in the waiting room.

## 2017-08-17 NOTE — ED Provider Notes (Signed)
[facility] EMERGENCY DEPARTMENT Provider Note   CSN: [id number] Arrival date & time: 08/17/17  1747     History   Chief Complaint Chief Complaint  Patient presents with  . Leg Swelling    HPI [name] [name] is a 83 y.o. male.  The history is provided by the patient.  Patient presents for right leg swelling for 1 day, that has been worsening for 1 day Nothing improves his symptoms. He denies fever/vomiting/chest pain, and no shortness of breath. No new trauma reported He presents for concerns of blood clot in the right leg Denies history of DVT, but his PMH reveals that he has had DVTs previously He has a history of atrial fibrillation, and is on Eliquis already at this time He has no other complaints  Past Medical History:  Diagnosis Date  . Anxiety   . Aortic valve insufficiency   . Arthritis    "wee bit; knees, elbows" (10/15/2014)  . Atrial fibrillation ([place])   . Cardiomyopathy ([place])   . Chronic kidney disease    "down to ~ 1/2 of their regular use; I see kidney dr. in [place]" (10/15/2014)  . Complication of anesthesia    unsure of complications but pt. states there were complications  . COPD (chronic obstructive pulmonary disease) ([place])   . Coronary artery disease   . Decreased testosterone level   . Depression   . Difficult intubation    difficult intubation 12/03/09 and 03/27/10 ([facility]); glidescope used 03/27/10  . DVT (deep venous thrombosis) ([place]) ~ 2013   "BLE"  . Dysrhythmia    A. Fib  . Emphysema of lung ([place])   . Enlarged prostate   . Falls frequently    > 20 times in the last year/notes 10/15/2014  . GERD (gastroesophageal reflux disease)   . Heart murmur   . History of blood transfusion 1960   "lots; related to an accident"  . History of echocardiogram    post AVR >> Echo 7/16:  Mild LVH, EF 20-25%, AVR ok (peak 15 mmHg, mean 9 mmHg), MAC, mild MR, severe LAE, mild reduced RVSF, mild RAE, PASP 35 mmHg  . Hypercholesteremia   .  Hypertension   . Hypothyroidism   . OSA (obstructive sleep apnea)    "suppose to wear mask; I throw it off in my sleep" (10/15/2014)  . Plantar fasciitis   . Right fibular fracture 10/15/2014  . Seasonal allergies   . Shortness of breath dyspnea   . Syncope 10/15/2014  . Syncope and collapse "several times"  . Urine frequency   . Urine incontinence   . Varicose veins     Patient Active Problem List   Diagnosis Date Noted  . Pre-diabetes 01/07/2017  . Pneumonia 01/07/2017  . COPD (chronic obstructive pulmonary disease) with chronic bronchitis ([place]) 12/08/2016  . Elevated hemidiaphragm 12/08/2016  . Enlarged thoracic aorta ([place]) 12/03/2016  . Chronic diastolic heart failure ([place]) 12/03/2016  . Pulmonary nodule 12/03/2016  . Orthostatic hypotension 11/08/2015  . CKD (chronic kidney disease) 03/18/2015  . S/P AVR 02/11/2015  . Ataxia 12/05/2014  . Abnormality of gait 12/05/2014  . Neck pain, chronic 12/05/2014  . Abnormal carotid duplex scan 10/28/2014  . Obstructive sleep apnea 10/28/2014  . Mood disorder with psychosis ([place]) 10/28/2014  . Episodes of formed visual hallucinations 10/16/2014  . Hypoxia 10/15/2014  . CAD (coronary artery disease), native coronary artery 09/29/2014  . Falls frequently 09/29/2014  . Essential hypertension   . Hyperlipidemia, unspecified   .  AR (aortic regurgitation)   . Depression   . Hypothyroidism 06/20/2014  . Exertional dyspnea 04/29/2014  . Obesity (BMI 30-39.9) 08/08/2012  . Anxiety 08/03/2012  . Hydrocele 07/14/2012  . Abdominal aortic aneurysm ([place]) 09/08/2011  . BPH (benign prostatic hyperplasia) 09/08/2011  . GERD (gastroesophageal reflux disease) 09/08/2011    Past Surgical History:  Procedure Laterality Date  . ABDOMINAL AORTIC ANEURYSM REPAIR  01/2006; 03/10/2006   Archie Endo 01/12/2011  . AORTIC VALVE REPLACEMENT N/A 02/11/2015   Procedure: AORTIC VALVE REPLACEMENT (AVR);  Surgeon: Grace Isaac, MD;  Location: [facility];  Service:  Open Heart Surgery;  Laterality: N/A;  . BRAIN SURGERY  1960 X 4   "S/P got my skull busted"; pt. states removed internal carotid artery  . CARDIAC CATHETERIZATION  1990's X 1; 09/2014   no stents  . CLIPPING OF ATRIAL APPENDAGE N/A 02/11/2015   Procedure: CLIPPING OF ATRIAL APPENDAGE;  Surgeon: Grace Isaac, MD;  Location: [facility];  Service: Open Heart Surgery;  Laterality: N/A;  . ERCP  11/2009   Archie Endo 12/05/2009  . gall stone    . HERNIA REPAIR    . LAPAROSCOPIC CHOLECYSTECTOMY  08/2009   w/IOC/notes 09/18/2009  . LAPAROSCOPIC INCISIONAL / UMBILICAL / VENTRAL HERNIA REPAIR  02/2010   VHR w/mesh/notes 03/28/2010  . NOSE SURGERY    . TEE WITHOUT CARDIOVERSION N/A 02/11/2015   Procedure: TRANSESOPHAGEAL ECHOCARDIOGRAM (TEE);  Surgeon: Grace Isaac, MD;  Location: [facility];  Service: Open Heart Surgery;  Laterality: N/A;  . TONSILLECTOMY         Home Medications    Prior to Admission medications   Medication Sig Start Date End Date Taking? Authorizing Provider  amLODipine (NORVASC) 2.5 MG tablet Take 2 tablets (5 mg total) by mouth daily. 01/04/17  Yes Nahser, [name], MD  apixaban (ELIQUIS) 2.5 MG TABS tablet Take 1 tablet (2.5 mg total) by mouth 2 (two) times daily. 12/23/16  Yes Nahser, [name], MD  atorvastatin (LIPITOR) 10 MG tablet Take 1 tablet (10 mg total) by mouth daily. 02/28/15  Yes Angiulli, Lavon Paganini, PA-C  buPROPion (WELLBUTRIN XL) 300 MG 24 hr tablet Take 1 tablet (300 mg total) by mouth daily. 02/28/15  Yes Angiulli, Lavon Paganini, PA-C  busPIRone (BUSPAR) 15 MG tablet Take 1 tablet (15 mg total) by mouth 2 (two) times daily. 02/28/15  Yes Angiulli, Lavon Paganini, PA-C  carvedilol (COREG) 25 MG tablet Take 1 tablet (25 mg total) by mouth 2 (two) times daily. 01/04/17  Yes Nahser, [name], MD  clonazePAM (KLONOPIN) 0.5 MG tablet Take 1 tablet (0.5 mg total) by mouth at bedtime. 02/28/15  Yes Angiulli, Lavon Paganini, PA-C  dutasteride (AVODART) 0.5 MG capsule Take 1 capsule (0.5 mg total) by mouth  daily. 07/16/15  Yes Meredith Staggers, MD  escitalopram (LEXAPRO) 20 MG tablet Take 1 tablet (20 mg total) by mouth daily. 02/28/15  Yes Angiulli, Lavon Paganini, PA-C  folic acid (FOLVITE) 1 MG tablet Take 1 tablet (1 mg total) by mouth daily. 08/13/15  Yes Meredith Staggers, MD  furosemide (LASIX) 40 MG tablet Take 1 tablet (40 mg total) by mouth daily. 10/26/16 08/17/17 Yes Bhagat, Bhavinkumar, PA  KLOR-CON M10 10 MEQ tablet TAKE 1 TABLET (10 MEQ TOTAL) BY MOUTH DAILY. 06/22/17  Yes Nahser, [name], MD  lamoTRIgine (LAMICTAL) 100 MG tablet Take 1 tablet (100 mg total) by mouth 2 (two) times daily. Breakfast and dinner 02/28/15  Yes Angiulli, Lavon Paganini, PA-C  levothyroxine (  SYNTHROID, LEVOTHROID) 150 MCG tablet Take 150 mcg by mouth daily before breakfast.    Yes [provider]  mirtazapine (REMERON) 7.5 MG tablet Take 7.5 mg by mouth daily. 10/13/15  Yes [provider]  omeprazole (PRILOSEC) 20 MG capsule Take 1 capsule (20 mg total) by mouth daily. 12/08/16  Yes Juanito Doom, MD  Tiotropium Bromide-Olodaterol (STIOLTO RESPIMAT) 2.5-2.5 MCG/ACT AERS Inhale 2 puffs into the lungs daily. 02/23/17  Yes Juanito Doom, MD  doxycycline (VIBRAMYCIN) 100 MG capsule Take 1 capsule (100 mg total) by mouth 2 (two) times daily. One po bid x 7 days 08/17/17   Ripley Fraise, MD  potassium chloride (K-DUR) 10 MEQ tablet Take 1 tablet (10 mEq total) by mouth daily. Patient not taking: Reported on 08/17/2017 10/26/16   Leanor Kail, PA    Family History Family History  Problem Relation Age of Onset  . Rheum arthritis Mother   . Diabetes Mother   . Heart disease Father   . Rheum arthritis Father   . Kidney disease Father   . Kidney failure Brother   . Breast cancer Sister   . Other Sister        Murdered  . Depression Sister   . Cancer Sister   . Stroke Sister   . Colon cancer Neg Hx   . Colon polyps Neg Hx   . Liver disease Neg Hx     Social History Social History    Tobacco Use  . Smoking status: Former Smoker    Packs/day: 2.00    Years: 22.00    Pack years: 44.00    Types: Cigarettes    Last attempt to quit: 04/29/1974    Years since quitting: 43.3  . Smokeless tobacco: Never Used  Substance Use Topics  . Alcohol use: Yes    Alcohol/week: 0.0 oz    Comment: 1 beer per year   . Drug use: No     Allergies   Patient has no known allergies.   Review of Systems Review of Systems  Constitutional: Negative for fever.  Cardiovascular: Positive for leg swelling. Negative for chest pain.  All other systems reviewed and are negative.    Physical Exam Updated Vital Signs BP (!) 122/91 (BP Location: Right Wrist)   Pulse 69   Temp 97.7 F (36.5 C) (Oral)   Resp 20   Ht 1.829 m (6')   Wt 136.1 kg (300 lb)   SpO2 91%   BMI 40.69 kg/m   Physical Exam CONSTITUTIONAL: Well developed/well nourished HEAD: Normocephalic/atraumatic ENMT: Mucous membranes moist NECK: supple no meningeal signs CV: S1/S2 noted LUNGS: Lungs are clear to auscultation bilaterally, no apparent distress ABDOMEN: soft NEURO: Pt is awake/alert/appropriate, moves all extremitiesx4.  No facial droop.   EXTREMITIES: pulses normal/equal, full ROM, see photo below. Mild calf tenderness, no crepitus, foot is warm to touch Bilateral pitting edema, right greater than left SKIN: warm, color normal PSYCH: no abnormalities of mood noted, alert and oriented to situation    Patient gave verbal permission to utilize photo for medical documentation only The image was not stored on any personal device ED Treatments / Results  Labs (all labs ordered are listed, but only abnormal results are displayed) Labs Reviewed  CBC - Abnormal; Notable for the following components:      Result Value   RBC 4.13 (*)    Hemoglobin 12.8 (*)    All other components within normal limits  BASIC METABOLIC PANEL - Abnormal; Notable for  the following components:   Glucose, Bld 105 (*)     BUN 23 (*)    Creatinine, Ser 1.79 (*)    Calcium 8.8 (*)    GFR calc non Af Amer 33 (*)    GFR calc Af Amer 39 (*)    All other components within normal limits  PROTIME-INR    EKG  EKG Interpretation None       Radiology No results found.  Procedures Procedures (including critical care time)  Medications Ordered in ED Medications - No data to display   Initial Impression / Assessment and Plan / ED Course  I have reviewed the triage vital signs and the nursing notes.  Pertinent labs results that were available during my care of the patient were reviewed by me and considered in my medical decision making (see chart for details).     Unilateral right leg swelling and redness, could be DVT versus cellulitis He is already on Eliquis for A. fib, so will defer further anticoagulation for now He will return later this morning for definitive DVT study, and I feel he is safe to be discharged We will also place him on doxycycline for possible cellulitis  Patient Agreeable with plan Final Clinical Impressions(s) / ED Diagnoses   Final diagnoses:  Peripheral edema    ED Discharge Orders        Ordered    LE VENOUS     08/17/17 2349    doxycycline (VIBRAMYCIN) 100 MG capsule  2 times daily     08/17/17 2349       Ripley Fraise, MD 08/18/17 0006

## 2017-08-17 NOTE — ED Notes (Signed)
Was unable to get blood 

## 2017-08-17 NOTE — Discharge Instructions (Signed)
Please return tomorrow as we discussed to get the ultrasound of your right leg

## 2017-08-17 NOTE — ED Triage Notes (Signed)
The pt is c/o his rt leg swollen since yesterday  Pain in his calf  He was sent to r/o a clot  No previous history

## 2017-08-18 ENCOUNTER — Ambulatory Visit (HOSPITAL_COMMUNITY)
Admission: RE | Admit: 2017-08-18 | Discharge: 2017-08-18 | Disposition: A | Discharge status: Home / Self Care | Payer: Medicare Other | Source: Ambulatory Visit | Attending: Emergency Medicine | Admitting: Emergency Medicine

## 2017-08-18 DIAGNOSIS — M7989 Other specified soft tissue disorders: Secondary | ICD-10-CM | POA: Diagnosis not present

## 2017-08-18 DIAGNOSIS — M79609 Pain in unspecified limb: Secondary | ICD-10-CM | POA: Diagnosis not present

## 2017-08-18 DIAGNOSIS — M79604 Pain in right leg: Secondary | ICD-10-CM | POA: Insufficient documentation

## 2017-08-18 DIAGNOSIS — R590 Localized enlarged lymph nodes: Secondary | ICD-10-CM | POA: Diagnosis not present

## 2017-08-18 NOTE — Progress Notes (Signed)
RLE venous duplex prelim: negative for DVT.  [place] Nordquist Eunice, RDMS, RVT  

## 2017-09-20 ENCOUNTER — Other Ambulatory Visit: Payer: Self-pay | Admitting: Cardiovascular Disease

## 2017-09-21 ENCOUNTER — Other Ambulatory Visit: Payer: Self-pay | Admitting: Pulmonary Disease

## 2017-12-19 ENCOUNTER — Other Ambulatory Visit: Payer: Self-pay | Admitting: Cardiovascular Disease

## 2018-01-05 ENCOUNTER — Inpatient Hospital Stay (HOSPITAL_COMMUNITY)
Admission: EM | Admit: 2018-01-05 | Discharge: 2018-01-10 | DRG: 603 | Disposition: A | Discharge status: Home / Self Care | Payer: Medicare Other | Attending: Family Medicine | Admitting: Family Medicine

## 2018-01-05 ENCOUNTER — Encounter (HOSPITAL_COMMUNITY): Payer: Self-pay | Admitting: Emergency Medicine

## 2018-01-05 ENCOUNTER — Inpatient Hospital Stay (HOSPITAL_COMMUNITY): Payer: Medicare Other

## 2018-01-05 ENCOUNTER — Ambulatory Visit: Payer: Medicare Other

## 2018-01-05 VITALS — BP 120/88 | HR 88 | Temp 98.6°F | Wt 313.4 lb

## 2018-01-05 DIAGNOSIS — Z8679 Personal history of other diseases of the circulatory system: Secondary | ICD-10-CM

## 2018-01-05 DIAGNOSIS — F419 Anxiety disorder, unspecified: Secondary | ICD-10-CM | POA: Diagnosis present

## 2018-01-05 DIAGNOSIS — I878 Other specified disorders of veins: Secondary | ICD-10-CM | POA: Diagnosis present

## 2018-01-05 DIAGNOSIS — B372 Candidiasis of skin and nail: Secondary | ICD-10-CM | POA: Diagnosis present

## 2018-01-05 DIAGNOSIS — E785 Hyperlipidemia, unspecified: Secondary | ICD-10-CM | POA: Diagnosis present

## 2018-01-05 DIAGNOSIS — F39 Unspecified mood [affective] disorder: Secondary | ICD-10-CM | POA: Diagnosis present

## 2018-01-05 DIAGNOSIS — G4733 Obstructive sleep apnea (adult) (pediatric): Secondary | ICD-10-CM | POA: Diagnosis present

## 2018-01-05 DIAGNOSIS — L03115 Cellulitis of right lower limb: Secondary | ICD-10-CM | POA: Diagnosis not present

## 2018-01-05 DIAGNOSIS — Z9089 Acquired absence of other organs: Secondary | ICD-10-CM

## 2018-01-05 DIAGNOSIS — Z6839 Body mass index (BMI) 39.0-39.9, adult: Secondary | ICD-10-CM | POA: Diagnosis not present

## 2018-01-05 DIAGNOSIS — Z952 Presence of prosthetic heart valve: Secondary | ICD-10-CM

## 2018-01-05 DIAGNOSIS — Z803 Family history of malignant neoplasm of breast: Secondary | ICD-10-CM

## 2018-01-05 DIAGNOSIS — Z7901 Long term (current) use of anticoagulants: Secondary | ICD-10-CM | POA: Diagnosis not present

## 2018-01-05 DIAGNOSIS — I480 Paroxysmal atrial fibrillation: Secondary | ICD-10-CM

## 2018-01-05 DIAGNOSIS — L304 Erythema intertrigo: Secondary | ICD-10-CM | POA: Diagnosis present

## 2018-01-05 DIAGNOSIS — Z8249 Family history of ischemic heart disease and other diseases of the circulatory system: Secondary | ICD-10-CM

## 2018-01-05 DIAGNOSIS — K219 Gastro-esophageal reflux disease without esophagitis: Secondary | ICD-10-CM | POA: Diagnosis present

## 2018-01-05 DIAGNOSIS — I10 Essential (primary) hypertension: Secondary | ICD-10-CM | POA: Diagnosis present

## 2018-01-05 DIAGNOSIS — Z79899 Other long term (current) drug therapy: Secondary | ICD-10-CM

## 2018-01-05 DIAGNOSIS — I2510 Atherosclerotic heart disease of native coronary artery without angina pectoris: Secondary | ICD-10-CM | POA: Diagnosis present

## 2018-01-05 DIAGNOSIS — J4489 Other specified chronic obstructive pulmonary disease: Secondary | ICD-10-CM | POA: Diagnosis present

## 2018-01-05 DIAGNOSIS — Z818 Family history of other mental and behavioral disorders: Secondary | ICD-10-CM

## 2018-01-05 DIAGNOSIS — N189 Chronic kidney disease, unspecified: Secondary | ICD-10-CM | POA: Diagnosis present

## 2018-01-05 DIAGNOSIS — E039 Hypothyroidism, unspecified: Secondary | ICD-10-CM | POA: Diagnosis present

## 2018-01-05 DIAGNOSIS — Z8261 Family history of arthritis: Secondary | ICD-10-CM

## 2018-01-05 DIAGNOSIS — I872 Venous insufficiency (chronic) (peripheral): Secondary | ICD-10-CM | POA: Diagnosis present

## 2018-01-05 DIAGNOSIS — M79609 Pain in unspecified limb: Secondary | ICD-10-CM | POA: Diagnosis not present

## 2018-01-05 DIAGNOSIS — Z79890 Hormone replacement therapy (postmenopausal): Secondary | ICD-10-CM

## 2018-01-05 DIAGNOSIS — Z823 Family history of stroke: Secondary | ICD-10-CM

## 2018-01-05 DIAGNOSIS — J449 Chronic obstructive pulmonary disease, unspecified: Secondary | ICD-10-CM | POA: Diagnosis present

## 2018-01-05 DIAGNOSIS — Z9049 Acquired absence of other specified parts of digestive tract: Secondary | ICD-10-CM

## 2018-01-05 DIAGNOSIS — I130 Hypertensive heart and chronic kidney disease with heart failure and stage 1 through stage 4 chronic kidney disease, or unspecified chronic kidney disease: Secondary | ICD-10-CM | POA: Diagnosis present

## 2018-01-05 DIAGNOSIS — M7989 Other specified soft tissue disorders: Secondary | ICD-10-CM | POA: Diagnosis not present

## 2018-01-05 DIAGNOSIS — E669 Obesity, unspecified: Secondary | ICD-10-CM | POA: Diagnosis present

## 2018-01-05 DIAGNOSIS — Z86718 Personal history of other venous thrombosis and embolism: Secondary | ICD-10-CM

## 2018-01-05 DIAGNOSIS — Z833 Family history of diabetes mellitus: Secondary | ICD-10-CM

## 2018-01-05 DIAGNOSIS — Z953 Presence of xenogenic heart valve: Secondary | ICD-10-CM

## 2018-01-05 DIAGNOSIS — L03818 Cellulitis of other sites: Secondary | ICD-10-CM

## 2018-01-05 DIAGNOSIS — I429 Cardiomyopathy, unspecified: Secondary | ICD-10-CM | POA: Diagnosis present

## 2018-01-05 DIAGNOSIS — I351 Nonrheumatic aortic (valve) insufficiency: Secondary | ICD-10-CM | POA: Diagnosis present

## 2018-01-05 DIAGNOSIS — J441 Chronic obstructive pulmonary disease with (acute) exacerbation: Secondary | ICD-10-CM

## 2018-01-05 DIAGNOSIS — I5022 Chronic systolic (congestive) heart failure: Secondary | ICD-10-CM | POA: Diagnosis present

## 2018-01-05 DIAGNOSIS — N183 Chronic kidney disease, stage 3 (moderate): Secondary | ICD-10-CM | POA: Diagnosis present

## 2018-01-05 DIAGNOSIS — Z841 Family history of disorders of kidney and ureter: Secondary | ICD-10-CM

## 2018-01-05 DIAGNOSIS — N400 Enlarged prostate without lower urinary tract symptoms: Secondary | ICD-10-CM | POA: Diagnosis present

## 2018-01-05 DIAGNOSIS — R609 Edema, unspecified: Secondary | ICD-10-CM | POA: Diagnosis not present

## 2018-01-05 DIAGNOSIS — F329 Major depressive disorder, single episode, unspecified: Secondary | ICD-10-CM | POA: Diagnosis present

## 2018-01-05 DIAGNOSIS — L0390 Cellulitis, unspecified: Secondary | ICD-10-CM | POA: Diagnosis present

## 2018-01-05 DIAGNOSIS — Z9119 Patient's noncompliance with other medical treatment and regimen: Secondary | ICD-10-CM

## 2018-01-05 LAB — URINALYSIS, ROUTINE W REFLEX MICROSCOPIC
Bilirubin Urine: NEGATIVE
Glucose, UA: NEGATIVE mg/dL
Hgb urine dipstick: NEGATIVE
Ketones, ur: NEGATIVE mg/dL
Leukocytes, UA: NEGATIVE
Nitrite: NEGATIVE
Protein, ur: NEGATIVE mg/dL
Specific Gravity, Urine: 1.014 (ref 1.005–1.030)
pH: 6 (ref 5.0–8.0)

## 2018-01-05 LAB — CBC WITH DIFFERENTIAL/PLATELET
BASOS ABS: 0 10*3/uL (ref 0.0–0.1)
Basophils Relative: 0 %
EOS PCT: 1 %
Eosinophils Absolute: 0.1 10*3/uL (ref 0.0–0.7)
HEMATOCRIT: 39.7 % (ref 39.0–52.0)
HEMOGLOBIN: 13 g/dL (ref 13.0–17.0)
LYMPHS PCT: 24 %
Lymphs Abs: 1.6 10*3/uL (ref 0.7–4.0)
MCH: 30.7 pg (ref 26.0–34.0)
MCHC: 32.7 g/dL (ref 30.0–36.0)
MCV: 93.6 fL (ref 78.0–100.0)
MONOS PCT: 8 %
Monocytes Absolute: 0.5 10*3/uL (ref 0.1–1.0)
Neutro Abs: 4.3 10*3/uL (ref 1.7–7.7)
Neutrophils Relative %: 67 %
Platelets: 275 10*3/uL (ref 150–400)
RBC: 4.24 MIL/uL (ref 4.22–5.81)
RDW: 14.7 % (ref 11.5–15.5)
WBC: 6.5 10*3/uL (ref 4.0–10.5)

## 2018-01-05 LAB — COMPREHENSIVE METABOLIC PANEL
ALT: 12 U/L — ABNORMAL LOW (ref 17–63)
ANION GAP: 10 (ref 5–15)
AST: 33 U/L (ref 15–41)
Albumin: 3.7 g/dL (ref 3.5–5.0)
Alkaline Phosphatase: 139 U/L — ABNORMAL HIGH (ref 38–126)
BILIRUBIN TOTAL: 1.4 mg/dL — AB (ref 0.3–1.2)
BUN: 22 mg/dL — ABNORMAL HIGH (ref 6–20)
CHLORIDE: 104 mmol/L (ref 101–111)
CO2: 25 mmol/L (ref 22–32)
Calcium: 8.6 mg/dL — ABNORMAL LOW (ref 8.9–10.3)
Creatinine, Ser: 1.7 mg/dL — ABNORMAL HIGH (ref 0.61–1.24)
GFR calc Af Amer: 41 mL/min — ABNORMAL LOW (ref >60)
GFR calc non Af Amer: 35 mL/min — ABNORMAL LOW (ref >60)
Glucose, Bld: 95 mg/dL (ref 65–99)
POTASSIUM: 4.8 mmol/L (ref 3.5–5.1)
SODIUM: 139 mmol/L (ref 135–145)
TOTAL PROTEIN: 7.9 g/dL (ref 6.5–8.1)

## 2018-01-05 LAB — I-STAT CG4 LACTIC ACID, ED
LACTIC ACID, VENOUS: 1.31 mmol/L (ref 0.5–1.9)
Lactic Acid, Venous: 1.33 mmol/L (ref 0.5–1.9)

## 2018-01-05 MED ORDER — ACETAMINOPHEN 650 MG RE SUPP: 650.0000 mg | Freq: Four times a day (QID) | RECTAL | Status: DC | PRN

## 2018-01-05 MED ORDER — ONDANSETRON HCL 4 MG PO TABS: 4.0000 mg | ORAL_TABLET | Freq: Four times a day (QID) | ORAL | Status: DC | PRN

## 2018-01-05 MED ORDER — CEFAZOLIN SODIUM-DEXTROSE 1-4 GM/50ML-% IV SOLN
1.0000 g | Freq: Once | INTRAVENOUS | Status: AC
  Administered 2018-01-05: 1 g via INTRAVENOUS
  Filled 2018-01-05: qty 50

## 2018-01-05 MED ORDER — CARVEDILOL 25 MG PO TABS
25.0000 mg | ORAL_TABLET | Freq: Two times a day (BID) | ORAL | Status: DC
  Administered 2018-01-05 – 2018-01-10 (×10): 25 mg via ORAL
  Filled 2018-01-05 (×11): qty 1

## 2018-01-05 MED ORDER — APIXABAN 2.5 MG PO TABS
2.5000 mg | ORAL_TABLET | Freq: Two times a day (BID) | ORAL | Status: DC
  Administered 2018-01-05 – 2018-01-10 (×10): 2.5 mg via ORAL
  Filled 2018-01-05 (×11): qty 1

## 2018-01-05 MED ORDER — IPRATROPIUM-ALBUTEROL 0.5-2.5 (3) MG/3ML IN SOLN
3.0000 mL | RESPIRATORY_TRACT | Status: DC | PRN
  Administered 2018-01-06: 3 mL via RESPIRATORY_TRACT

## 2018-01-05 MED ORDER — IPRATROPIUM-ALBUTEROL 0.5-2.5 (3) MG/3ML IN SOLN
3.0000 mL | RESPIRATORY_TRACT | Status: DC
  Administered 2018-01-05: 3 mL via RESPIRATORY_TRACT
  Filled 2018-01-05: qty 3

## 2018-01-05 MED ORDER — CLONAZEPAM 0.5 MG PO TABS
0.5000 mg | ORAL_TABLET | Freq: Every day | ORAL | Status: DC
  Administered 2018-01-05 – 2018-01-09 (×5): 0.5 mg via ORAL
  Filled 2018-01-05 (×6): qty 1

## 2018-01-05 MED ORDER — BUPROPION HCL ER (XL) 300 MG PO TB24
300.0000 mg | ORAL_TABLET | Freq: Every day | ORAL | Status: DC
  Administered 2018-01-06 – 2018-01-10 (×5): 300 mg via ORAL
  Filled 2018-01-05 (×5): qty 1

## 2018-01-05 MED ORDER — SODIUM CHLORIDE 0.9% FLUSH: 3.0000 mL | INTRAVENOUS | Status: DC | PRN

## 2018-01-05 MED ORDER — POTASSIUM CHLORIDE CRYS ER 10 MEQ PO TBCR
10.0000 meq | EXTENDED_RELEASE_TABLET | Freq: Every day | ORAL | Status: DC
  Administered 2018-01-06 – 2018-01-10 (×5): 10 meq via ORAL
  Filled 2018-01-05 (×6): qty 1

## 2018-01-05 MED ORDER — SODIUM CHLORIDE 0.9% FLUSH
3.0000 mL | Freq: Two times a day (BID) | INTRAVENOUS | Status: DC
  Administered 2018-01-08 – 2018-01-09 (×2): 3 mL via INTRAVENOUS

## 2018-01-05 MED ORDER — IPRATROPIUM-ALBUTEROL 0.5-2.5 (3) MG/3ML IN SOLN
3.0000 mL | Freq: Four times a day (QID) | RESPIRATORY_TRACT | Status: DC
  Administered 2018-01-06: 3 mL via RESPIRATORY_TRACT
  Filled 2018-01-05: qty 3

## 2018-01-05 MED ORDER — ONDANSETRON HCL 4 MG/2ML IJ SOLN: 4.0000 mg | Freq: Four times a day (QID) | INTRAMUSCULAR | Status: DC | PRN

## 2018-01-05 MED ORDER — CLINDAMYCIN PHOSPHATE 600 MG/50ML IV SOLN
600.0000 mg | Freq: Three times a day (TID) | INTRAVENOUS | Status: DC
  Administered 2018-01-05 – 2018-01-10 (×15): 600 mg via INTRAVENOUS
  Filled 2018-01-05 (×16): qty 50

## 2018-01-05 MED ORDER — DUTASTERIDE 0.5 MG PO CAPS
0.5000 mg | ORAL_CAPSULE | Freq: Every day | ORAL | Status: DC
  Administered 2018-01-05 – 2018-01-10 (×6): 0.5 mg via ORAL
  Filled 2018-01-05 (×6): qty 1

## 2018-01-05 MED ORDER — ACETAMINOPHEN 325 MG PO TABS
650.0000 mg | ORAL_TABLET | Freq: Four times a day (QID) | ORAL | Status: DC | PRN
  Filled 2018-01-05: qty 2

## 2018-01-05 MED ORDER — PREDNISONE 20 MG PO TABS
40.0000 mg | ORAL_TABLET | Freq: Every day | ORAL | Status: DC
  Administered 2018-01-06 – 2018-01-09 (×4): 40 mg via ORAL
  Filled 2018-01-05 (×4): qty 2

## 2018-01-05 MED ORDER — MOMETASONE FURO-FORMOTEROL FUM 200-5 MCG/ACT IN AERO
2.0000 | INHALATION_SPRAY | Freq: Two times a day (BID) | RESPIRATORY_TRACT | Status: DC
  Administered 2018-01-05 – 2018-01-10 (×9): 2 via RESPIRATORY_TRACT
  Filled 2018-01-05: qty 8.8

## 2018-01-05 MED ORDER — ESCITALOPRAM OXALATE 20 MG PO TABS
20.0000 mg | ORAL_TABLET | Freq: Every day | ORAL | Status: DC
  Administered 2018-01-06 – 2018-01-10 (×5): 20 mg via ORAL
  Filled 2018-01-05 (×5): qty 1

## 2018-01-05 MED ORDER — IPRATROPIUM-ALBUTEROL 0.5-2.5 (3) MG/3ML IN SOLN
3.0000 mL | Freq: Once | RESPIRATORY_TRACT | Status: AC
Start: 2018-01-05 — End: 2018-01-05
  Administered 2018-01-05: 3 mL via RESPIRATORY_TRACT
  Filled 2018-01-05: qty 3

## 2018-01-05 MED ORDER — AMLODIPINE BESYLATE 5 MG PO TABS
5.0000 mg | ORAL_TABLET | Freq: Every day | ORAL | Status: DC
  Administered 2018-01-06 – 2018-01-10 (×5): 5 mg via ORAL
  Filled 2018-01-05 (×5): qty 1

## 2018-01-05 MED ORDER — TIOTROPIUM BROMIDE MONOHYDRATE 18 MCG IN CAPS: 18.0000 ug | ORAL_CAPSULE | Freq: Every day | RESPIRATORY_TRACT | Status: DC

## 2018-01-05 MED ORDER — LEVOTHYROXINE SODIUM 75 MCG PO TABS
150.0000 ug | ORAL_TABLET | Freq: Every day | ORAL | Status: DC
  Administered 2018-01-06 – 2018-01-10 (×5): 150 ug via ORAL
  Filled 2018-01-05 (×5): qty 2

## 2018-01-05 MED ORDER — BUSPIRONE HCL 5 MG PO TABS
15.0000 mg | ORAL_TABLET | Freq: Two times a day (BID) | ORAL | Status: DC
  Administered 2018-01-05 – 2018-01-10 (×10): 15 mg via ORAL
  Filled 2018-01-05 (×11): qty 1

## 2018-01-05 MED ORDER — SODIUM CHLORIDE 0.9 % IV SOLN
1.0000 g | Freq: Two times a day (BID) | INTRAVENOUS | Status: DC
  Administered 2018-01-05 – 2018-01-10 (×10): 1 g via INTRAVENOUS
  Filled 2018-01-05 (×12): qty 1

## 2018-01-05 MED ORDER — ATORVASTATIN CALCIUM 10 MG PO TABS
10.0000 mg | ORAL_TABLET | Freq: Every day | ORAL | Status: DC
  Administered 2018-01-06 – 2018-01-10 (×5): 10 mg via ORAL
  Filled 2018-01-05 (×5): qty 1

## 2018-01-05 MED ORDER — NYSTATIN 100000 UNIT/GM EX POWD
Freq: Three times a day (TID) | CUTANEOUS | Status: DC
  Administered 2018-01-05: 1 via TOPICAL
  Administered 2018-01-06 – 2018-01-10 (×11): via TOPICAL
  Filled 2018-01-05: qty 15

## 2018-01-05 MED ORDER — MIRTAZAPINE 15 MG PO TABS
15.0000 mg | ORAL_TABLET | Freq: Every day | ORAL | Status: DC
  Administered 2018-01-05 – 2018-01-08 (×4): 15 mg via ORAL
  Filled 2018-01-05 (×8): qty 1

## 2018-01-05 MED ORDER — LAMOTRIGINE 100 MG PO TABS
100.0000 mg | ORAL_TABLET | Freq: Two times a day (BID) | ORAL | Status: DC
  Administered 2018-01-05 – 2018-01-10 (×10): 100 mg via ORAL
  Filled 2018-01-05 (×10): qty 1

## 2018-01-05 MED ORDER — PREDNISONE 20 MG PO TABS
60.0000 mg | ORAL_TABLET | Freq: Once | ORAL | Status: AC
Start: 2018-01-05 — End: 2018-01-05
  Administered 2018-01-05: 60 mg via ORAL
  Filled 2018-01-05: qty 3

## 2018-01-05 MED ORDER — SODIUM CHLORIDE 0.9 % IV SOLN
250.0000 mL | INTRAVENOUS | Status: DC | PRN
  Administered 2018-01-07: 250 mL via INTRAVENOUS

## 2018-01-05 MED ORDER — POLYETHYLENE GLYCOL 3350 17 G PO PACK: 17.0000 g | PACK | Freq: Every day | ORAL | Status: DC | PRN

## 2018-01-05 NOTE — ED Notes (Signed)
Bed: ZO10 Expected date:  Expected time:  Means of arrival:  Comments: [name] [name]

## 2018-01-05 NOTE — ED Notes (Signed)
ED Provider at bedside. ISAACS 

## 2018-01-05 NOTE — ED Notes (Signed)
ED TO INPATIENT HANDOFF REPORT  Name/Age/Gender [name] 84 y.o. male  Code Status Code Status History    Date Active Date Inactive Code Status Order ID Comments User Context   02/20/2015 1834 03/01/2015 1413 Full Code [id number]  Elizabeth Sauer Inpatient   02/11/2015 1402 02/20/2015 1834 Full Code [id number]  Marcene Corning Inpatient   11/11/2014 1636 02/11/2015 1402 Full Code [id number]  Hennie Duos, MD Outpatient   10/15/2014 0934 10/18/2014 1859 Partial Code [id number] DNI Karen Kitchens Inpatient   09/29/2014 1328 10/15/2014 0934 Full Code [id number]  Hennie Duos, MD Outpatient   06/20/2014 1513 06/22/2014 1653 Full Code [id number]  Caren Griffins, MD ED      Home/SNF/Other Home  Chief Complaint Infection in leg, sent by dr  Level of Care/Admitting Diagnosis ED Disposition    ED Disposition Condition [place]: [facility] [[id number]]  Level of Care: Med-Surg [16]  Diagnosis: Cellulitis [071219]  Admitting Physician: Cristy Folks [[id number]]  Attending Physician: Cristy Folks [[id number]]  Estimated length of stay: past midnight tomorrow  Certification:: I certify this patient will need inpatient services for at least 2 midnights  PT Class (Do Not Modify): Inpatient [101]  PT Acc Code (Do Not Modify): Private [1]       Medical History Past Medical History:  Diagnosis Date  . Anxiety   . Aortic valve insufficiency   . Arthritis    "wee bit; knees, elbows" (10/15/2014)  . Atrial fibrillation ([place])   . Cardiomyopathy ([place])   . Chronic kidney disease    "down to ~ 1/2 of their regular use; I see kidney dr. in [place]" (10/15/2014)  . Complication of anesthesia    unsure of complications but pt. states there were complications  . COPD (chronic obstructive pulmonary disease) ([place])   . Coronary artery disease   . Decreased testosterone level   . Depression   . Difficult intubation    difficult  intubation 12/03/09 and 03/27/10 ([facility]); glidescope used 03/27/10  . DVT (deep venous thrombosis) ([place]) ~ 2013   "BLE"  . Dysrhythmia    A. Fib  . Emphysema of lung ([place])   . Enlarged prostate   . Falls frequently    > 20 times in the last year/notes 10/15/2014  . GERD (gastroesophageal reflux disease)   . Heart murmur   . History of blood transfusion 1960   "lots; related to an accident"  . History of echocardiogram    post AVR >> Echo 7/16:  Mild LVH, EF 20-25%, AVR ok (peak 15 mmHg, mean 9 mmHg), MAC, mild MR, severe LAE, mild reduced RVSF, mild RAE, PASP 35 mmHg  . Hypercholesteremia   . Hypertension   . Hypothyroidism   . OSA (obstructive sleep apnea)    "suppose to wear mask; I throw it off in my sleep" (10/15/2014)  . Plantar fasciitis   . Right fibular fracture 10/15/2014  . Seasonal allergies   . Shortness of breath dyspnea   . Syncope 10/15/2014  . Syncope and collapse "several times"  . Urine frequency   . Urine incontinence   . Varicose veins     Allergies No Known Allergies  IV Location/Drains/Wounds Patient Lines/Drains/Airways Status   Active Line/Drains/Airways    Name:   Placement date:   Placement time:   Site:   Days:   Peripheral IV 01/05/18 Left Hand   01/05/18    2020  Hand   less than 1   Incision (Closed) 02/11/15 Chest Other (Comment)   02/11/15    1029     1059   Incision (Closed) 02/20/15 Thigh Left   02/20/15    1858     1050   Incision (Closed) 02/20/15 Leg Left   02/20/15    1858     1050   Incision (Closed) 02/20/15 Ankle Left   02/20/15    1858     1050   Incision (Closed) 02/20/15 Thigh Right   02/20/15    1858     1050   Incision (Closed) 02/20/15 Leg Right   02/20/15    1858     1050          Labs/Imaging Results for orders placed or performed during the hospital encounter of 01/05/18 (from the past 48 hour(s))  Comprehensive metabolic panel     Status: Abnormal   Collection Time: 01/05/18  4:15 PM  Result Value Ref Range   Sodium  139 135 - 145 mmol/L   Potassium 4.8 3.5 - 5.1 mmol/L   Chloride 104 101 - 111 mmol/L   CO2 25 22 - 32 mmol/L   Glucose, Bld 95 65 - 99 mg/dL   BUN 22 (H) 6 - 20 mg/dL   Creatinine, Ser 1.70 (H) 0.61 - 1.24 mg/dL   Calcium 8.6 (L) 8.9 - 10.3 mg/dL   Total Protein 7.9 6.5 - 8.1 g/dL   Albumin 3.7 3.5 - 5.0 g/dL   AST 33 15 - 41 U/L   ALT 12 (L) 17 - 63 U/L   Alkaline Phosphatase 139 (H) 38 - 126 U/L   Total Bilirubin 1.4 (H) 0.3 - 1.2 mg/dL   GFR calc non Af Amer 35 (L) >60 mL/min   GFR calc Af Amer 41 (L) >60 mL/min    Comment: (NOTE) The eGFR has been calculated using the CKD EPI equation. This calculation has not been validated in all clinical situations. eGFR's persistently <60 mL/min signify possible Chronic Kidney Disease.    Anion gap 10 5 - 15    Comment: Performed at [facility], [place] [address] [place], [place] [address]  CBC with Differential     Status: None   Collection Time: 01/05/18  4:15 PM  Result Value Ref Range   WBC 6.5 4.0 - 10.5 K/uL   RBC 4.24 4.22 - 5.81 MIL/uL   Hemoglobin 13.0 13.0 - 17.0 g/dL   HCT 39.7 39.0 - 52.0 %   MCV 93.6 78.0 - 100.0 fL   MCH 30.7 26.0 - 34.0 pg   MCHC 32.7 30.0 - 36.0 g/dL   RDW 14.7 11.5 - 15.5 %   Platelets 275 150 - 400 K/uL   Neutrophils Relative % 67 %   Lymphocytes Relative 24 %   Monocytes Relative 8 %   Eosinophils Relative 1 %   Basophils Relative 0 %   Neutro Abs 4.3 1.7 - 7.7 K/uL   Lymphs Abs 1.6 0.7 - 4.0 K/uL   Monocytes Absolute 0.5 0.1 - 1.0 K/uL   Eosinophils Absolute 0.1 0.0 - 0.7 K/uL   Basophils Absolute 0.0 0.0 - 0.1 K/uL   WBC Morphology WHITE COUNT CONFIRMED ON SMEAR     Comment: Performed at [facility], [place] [address], [place], [place] [address]  I-Stat CG4 Lactic Acid, ED     Status: None   Collection Time: 01/05/18  4:25 PM  Result Value Ref Range  Lactic Acid, Venous 1.31 0.5 - 1.9 mmol/L  I-Stat CG4 Lactic Acid, ED     Status: None   Collection  Time: 01/05/18  7:06 PM  Result Value Ref Range   Lactic Acid, Venous 1.33 0.5 - 1.9 mmol/L   Dg Chest 2 View  Result Date: 01/05/2018 CLINICAL DATA:  Short of breath. Change in breathing over the last several days. Dizziness with walking. EXAM: CHEST - 2 VIEW COMPARISON:  04/02/2015 FINDINGS: Stable changes from prior cardiac surgery. Cardiac silhouette is mildly enlarged, and appears larger than it did on the prior chest radiographs. No mediastinal or hilar masses. Mild linear atelectasis or scarring at the right lung base. Lungs otherwise clear. No pleural effusion or pneumothorax. Skeletal structures are grossly intact. IMPRESSION: 1. No acute cardiopulmonary disease. 2. Mild cardiomegaly. Cardiac silhouette appears mildly increased from the prior chest radiographs. Electronically Signed   By: Lajean Manes M.D.   On: 01/05/2018 19:35    Pending Labs Unresulted Labs (From admission, onward)   Start     Ordered   01/05/18 1605  Urinalysis, Routine w reflex microscopic  STAT,   STAT     01/05/18 1605   Signed and Held  Basic metabolic panel  Tomorrow morning,   R     Signed and Held   Signed and Held  CBC  Tomorrow morning,   R     Signed and Held      Vitals/Pain Today's Vitals   01/05/18 1603 01/05/18 1841 01/05/18 1842 01/05/18 1954  BP:    130/72  Pulse:    85  Resp:    17  Temp:      TempSrc:      SpO2:  91% 91% 92%  PainSc: 0-No pain       Isolation Precautions No active isolations  Medications Medications  ipratropium-albuterol (DUONEB) 0.5-2.5 (3) MG/3ML nebulizer solution 3 mL (3 mLs Nebulization Given 01/05/18 1841)  predniSONE (DELTASONE) tablet 60 mg (60 mg Oral Given 01/05/18 1858)  ceFAZolin (ANCEF) IVPB 1 g/50 mL premix (0 g Intravenous Stopped 01/05/18 1927)    Mobility walks with device

## 2018-01-05 NOTE — H&P (Signed)
History and Physical    [name] [name] FHL:[id number] DOB: [birth year] DOA: 01/05/2018  PCP: Aura Dials, MD   Patient coming from: home    Chief Complaint: RLE cellulitis, SOB, intertrigo  HPI: [name] [name] is a 84 y.o. male with medical history significant of COPD, AAA status post open repair in 2007, severe AI status post bioprosthetic AVR, paroxysmal atrial fibrillation status post left atrial appendage clipping on apixaban, chronic kidney disease stage III, obstructive sleep apnea noncompliant with CPAP, coronary artery disease (70% distal RCA occlusion on cardiac catheterization), hypertension, hyperlipidemia, benign prostatic hypertrophy, depression/anxiety with psychotic features, hypothyroidism, resolved chronic systolic heart failure presents to the ED with right lower extremity cellulitis.  Patient reports that approximately 1 week ago he began to notice on the posterior aspect of his right calf and erythematous area progressed with skin breakdown and large streaky indurated areas.  Patient also noted purulent material as well as honey crusted scaly lesions appearing around the initial site.  Patient also noted radiating streak of erythema that were irregular and scabrous.  Patient does have some pain at the initial site where the cellulitis started however he does not have any pain on ambulation.  He has not had any fevers or chills or night sweats. Patient also reports subacute dyspnea is particularly worse on exertion.  Patient has reported a chronic cough that is been ongoing and worsening for approximately 1 month.  Patient noticed some sputum production but has not had any change in quantity or quality.  Patient does not currently have a rescue inhaler at home.  Patient denies any chest pain but has had some chest tightness with breathing.  Patient does not have any orthopnea that is new however is chronically orthopneic.  He has chronic lower extremity edema that has not  worsened. Patient also notes for the past several weeks he has had some erythema and pruritus continuous areas of his groin as well as some flaking crusting lesions noted there. He denies any nausea, vomiting, abdominal pain, congestion, syncope/presyncope, paroxysmal nocturnal dyspnea.  ED Course: In the ED patient's vitals were stable.  His labs were generally unremarkable and showed only his chronic kidney disease.  Chest x-ray is pending.  Patient was given 1 dose of IV ceftezole and, a bronchodilator and was admitted.  Review of Systems: As per HPI otherwise 10 point review of systems negative.    Past Medical History:  Diagnosis Date  . Anxiety   . Aortic valve insufficiency   . Arthritis    "wee bit; knees, elbows" (10/15/2014)  . Atrial fibrillation ([place])   . Cardiomyopathy ([place])   . Chronic kidney disease    "down to ~ 1/2 of their regular use; I see kidney dr. in [place]" (10/15/2014)  . Complication of anesthesia    unsure of complications but pt. states there were complications  . COPD (chronic obstructive pulmonary disease) ([place])   . Coronary artery disease   . Decreased testosterone level   . Depression   . Difficult intubation    difficult intubation 12/03/09 and 03/27/10 ([facility]); glidescope used 03/27/10  . DVT (deep venous thrombosis) ([place]) ~ 2013   "BLE"  . Dysrhythmia    A. Fib  . Emphysema of lung ([place])   . Enlarged prostate   . Falls frequently    > 20 times in the last year/notes 10/15/2014  . GERD (gastroesophageal reflux disease)   . Heart murmur   . History of blood transfusion 1960   "lots; related to  an accident"  . History of echocardiogram    post AVR >> Echo 7/16:  Mild LVH, EF 20-25%, AVR ok (peak 15 mmHg, mean 9 mmHg), MAC, mild MR, severe LAE, mild reduced RVSF, mild RAE, PASP 35 mmHg  . Hypercholesteremia   . Hypertension   . Hypothyroidism   . OSA (obstructive sleep apnea)    "suppose to wear mask; I throw it off in my sleep" (10/15/2014)  .  Plantar fasciitis   . Right fibular fracture 10/15/2014  . Seasonal allergies   . Shortness of breath dyspnea   . Syncope 10/15/2014  . Syncope and collapse "several times"  . Urine frequency   . Urine incontinence   . Varicose veins     Past Surgical History:  Procedure Laterality Date  . ABDOMINAL AORTIC ANEURYSM REPAIR  01/2006; 03/10/2006   Archie Endo 01/12/2011  . AORTIC VALVE REPLACEMENT N/A 02/11/2015   Procedure: AORTIC VALVE REPLACEMENT (AVR);  Surgeon: Grace Isaac, MD;  Location: [facility];  Service: Open Heart Surgery;  Laterality: N/A;  . BRAIN SURGERY  1960 X 4   "S/P got my skull busted"; pt. states removed internal carotid artery  . CARDIAC CATHETERIZATION  1990's X 1; 09/2014   no stents  . CLIPPING OF ATRIAL APPENDAGE N/A 02/11/2015   Procedure: CLIPPING OF ATRIAL APPENDAGE;  Surgeon: Grace Isaac, MD;  Location: [facility];  Service: Open Heart Surgery;  Laterality: N/A;  . ERCP  11/2009   Archie Endo 12/05/2009  . gall stone    . HERNIA REPAIR    . LAPAROSCOPIC CHOLECYSTECTOMY  08/2009   w/IOC/notes 09/18/2009  . LAPAROSCOPIC INCISIONAL / UMBILICAL / VENTRAL HERNIA REPAIR  02/2010   VHR w/mesh/notes 03/28/2010  . NOSE SURGERY    . TEE WITHOUT CARDIOVERSION N/A 02/11/2015   Procedure: TRANSESOPHAGEAL ECHOCARDIOGRAM (TEE);  Surgeon: Grace Isaac, MD;  Location: [facility];  Service: Open Heart Surgery;  Laterality: N/A;  . TONSILLECTOMY       reports that he quit smoking about 43 years ago. His smoking use included cigarettes. He has a 44.00 pack-year smoking history. He has never used smokeless tobacco. He reports that he drinks alcohol. He reports that he does not use drugs.  No Known Allergies  Family History  Problem Relation Age of Onset  . Rheum arthritis Mother   . Diabetes Mother   . Heart disease Father   . Rheum arthritis Father   . Kidney disease Father   . Kidney failure Brother   . Breast cancer Sister   . Other Sister        Murdered  . Depression Sister    . Cancer Sister   . Stroke Sister   . Colon cancer Neg Hx   . Colon polyps Neg Hx   . Liver disease Neg Hx    Unacceptable: Noncontributory, unremarkable, or negative. Acceptable: Family history reviewed and not pertinent (If you reviewed it)  Prior to Admission medications   Medication Sig Start Date End Date Taking? Authorizing Provider  amLODipine (NORVASC) 2.5 MG tablet Take 2 tablets (5 mg total) by mouth daily. 01/04/17  Yes Nahser, [name], MD  atorvastatin (LIPITOR) 10 MG tablet Take 1 tablet (10 mg total) by mouth daily. 02/28/15  Yes Angiulli, Lavon Paganini, PA-C  buPROPion (WELLBUTRIN XL) 300 MG 24 hr tablet Take 1 tablet (300 mg total) by mouth daily. 02/28/15  Yes Angiulli, Lavon Paganini, PA-C  busPIRone (BUSPAR) 15 MG tablet Take 1 tablet (15 mg total) by  mouth 2 (two) times daily. 02/28/15  Yes Angiulli, Lavon Paganini, PA-C  carvedilol (COREG) 25 MG tablet TAKE 1 TABLET BY MOUTH TWICE A DAY 09/20/17  Yes Nahser, [name], MD  clonazePAM (KLONOPIN) 0.5 MG tablet Take 1 tablet (0.5 mg total) by mouth at bedtime. 02/28/15  Yes Angiulli, Lavon Paganini, PA-C  dutasteride (AVODART) 0.5 MG capsule Take 1 capsule (0.5 mg total) by mouth daily. 07/16/15  Yes Meredith Staggers, MD  ELIQUIS 2.5 MG TABS tablet TAKE 1 TABLET (2.5 MG TOTAL) BY MOUTH 2 (TWO) TIMES DAILY. 12/19/17  Yes Nahser, [name], MD  escitalopram (LEXAPRO) 20 MG tablet Take 1 tablet (20 mg total) by mouth daily. 02/28/15  Yes Angiulli, Lavon Paganini, PA-C  lamoTRIgine (LAMICTAL) 100 MG tablet Take 1 tablet (100 mg total) by mouth 2 (two) times daily. Breakfast and dinner 02/28/15  Yes Angiulli, Lavon Paganini, PA-C  levothyroxine (SYNTHROID, LEVOTHROID) 150 MCG tablet Take 150 mcg by mouth daily before breakfast.    Yes [provider]  mirtazapine (REMERON) 7.5 MG tablet Take 15 mg by mouth daily.  10/13/15  Yes [provider]  potassium chloride (K-DUR) 10 MEQ tablet Take 10 mEq by mouth daily.   Yes [provider]  Tiotropium  Bromide-Olodaterol (STIOLTO RESPIMAT) 2.5-2.5 MCG/ACT AERS Inhale 2 puffs into the lungs daily. 02/23/17  Yes Juanito Doom, MD  folic acid (FOLVITE) 1 MG tablet Take 1 tablet (1 mg total) by mouth daily. Patient not taking: Reported on 01/05/2018 08/13/15   Meredith Staggers, MD  furosemide (LASIX) 40 MG tablet Take 1 tablet (40 mg total) by mouth daily. 10/26/16 08/17/17  Bhagat, Bhavinkumar, PA  KLOR-CON M10 10 MEQ tablet TAKE 1 TABLET (10 MEQ TOTAL) BY MOUTH DAILY. Patient not taking: Reported on 01/05/2018 06/22/17   Nahser, [name], MD  omeprazole (PRILOSEC) 20 MG capsule Take 1 capsule (20 mg total) by mouth daily. Patient not taking: Reported on 01/05/2018 12/08/16   Juanito Doom, MD  potassium chloride (K-DUR) 10 MEQ tablet Take 1 tablet (10 mEq total) by mouth daily. Patient not taking: Reported on 08/17/2017 10/26/16   Leanor Kail, PA    Physical Exam: Vitals:   01/05/18 1601 01/05/18 1841 01/05/18 1842  BP: 138/85    Pulse: 89    Resp: 18    Temp: 98.1 F (36.7 C)    TempSrc: Oral    SpO2: 95% 91% 91%    Constitutional: NAD, calm, comfortable Vitals:   01/05/18 1601 01/05/18 1841 01/05/18 1842  BP: 138/85    Pulse: 89    Resp: 18    Temp: 98.1 F (36.7 C)    TempSrc: Oral    SpO2: 95% 91% 91%   Eyes: Anicteric sclera ENMT: Moist mucous membranes, no lesions Neck: normal, supple, pickwickian Respiratory: Mildly increased work of breathing, audible wheezing, scattered rhonchi worse on the left and at the bases, intermittent wheezing, no crackles Cardiovascular: Distant heart sounds, irregularly irregular rhythm, no murmurs Abdomen: Obese, soft, nontender, plus bowel sounds Musculoskeletal: 2+ lower extremity edema Skin: Over right posterior thigh noted to have large irregular denuded area of skin with surrounding erythema, edema, warmth and tenderness.  Numerous honey crusted scales noted throughout the area, streaking with scabrous raised lesions noted  over anterior thigh.  In bilateral intertriginous areas and thighs noted to have areas of erythema with raised papular lesions that are confluent with satellite lesions and flaking. Neurologic: Grossly intact, moving all extremities Psychiatric: Normal judgment and insight.  Alert and oriented x 3. Normal mood.     Labs on Admission: I have personally reviewed following labs and imaging studies  CBC: Recent Labs  Lab 01/05/18 1615  WBC 6.5  NEUTROABS 4.3  HGB 13.0  HCT 39.7  MCV 93.6  PLT 629   Basic Metabolic Panel: Recent Labs  Lab 01/05/18 1615  NA 139  K 4.8  CL 104  CO2 25  GLUCOSE 95  BUN 22*  CREATININE 1.70*  CALCIUM 8.6*   GFR: Estimated Creatinine Clearance: 47.3 mL/min (A) (by C-G formula based on SCr of 1.7 mg/dL (H)). Liver Function Tests: Recent Labs  Lab 01/05/18 1615  AST 33  ALT 12*  ALKPHOS 139*  BILITOT 1.4*  PROT 7.9  ALBUMIN 3.7   No results for input(s): LIPASE, AMYLASE in the last 168 hours. No results for input(s): AMMONIA in the last 168 hours. Coagulation Profile: No results for input(s): INR, PROTIME in the last 168 hours. Cardiac Enzymes: No results for input(s): CKTOTAL, CKMB, CKMBINDEX, TROPONINI in the last 168 hours. BNP (last 3 results) No results for input(s): PROBNP in the last 8760 hours. HbA1C: No results for input(s): HGBA1C in the last 72 hours. CBG: No results for input(s): GLUCAP in the last 168 hours. Lipid Profile: No results for input(s): CHOL, HDL, LDLCALC, TRIG, CHOLHDL, LDLDIRECT in the last 72 hours. Thyroid Function Tests: No results for input(s): TSH, T4TOTAL, FREET4, T3FREE, THYROIDAB in the last 72 hours. Anemia Panel: No results for input(s): VITAMINB12, FOLATE, FERRITIN, TIBC, IRON, RETICCTPCT in the last 72 hours. Urine analysis:    Component Value Date/Time   COLORURINE YELLOW 02/21/2015 1241   APPEARANCEUR CLEAR 02/21/2015 1241   LABSPEC 1.008 02/21/2015 1241   PHURINE 7.0 02/21/2015 1241    GLUCOSEU NEGATIVE 02/21/2015 1241   HGBUR NEGATIVE 02/21/2015 1241   BILIRUBINUR NEGATIVE 02/21/2015 1241   KETONESUR NEGATIVE 02/21/2015 1241   PROTEINUR NEGATIVE 02/21/2015 1241   UROBILINOGEN 2.0 (H) 02/21/2015 1241   NITRITE NEGATIVE 02/21/2015 1241   LEUKOCYTESUR NEGATIVE 02/21/2015 1241    Radiological Exams on Admission: No results found.  EKG: Independently reviewed.  Pending  Assessment/Plan Active Problems:   Paroxysmal A-fib (HCC)   Hypothyroidism   Essential hypertension   Hyperlipidemia, unspecified   CAD (coronary artery disease), native coronary artery   Obstructive sleep apnea   Mood disorder with psychosis (HCC)   S/P AVR   CKD (chronic kidney disease)   BPH (benign prostatic hyperplasia)   Obesity (BMI 30-39.9)   COPD (chronic obstructive pulmonary disease) with chronic bronchitis (HCC)   #) Cellulitis: Suspect the patient has some component of venous stasis as apparently they could not obtain saphenous vein grafts for possible CABG during his aortic valve repair.  This is at least somewhat contributing however the vast majority of his appears to be infectious at this time.  Suspect that it is relatively superficial infection however he does not have enough tenderness or pain to have erysipelas.  With honey crusting lesions suspect that there is at least a strong component of Streptococcus involved.  Regardless he is unfortunately immunocompromise due to his extensive medical history. - IV cefepime and clindamycin - Wound care consult  #) COPD with acute exacerbation: Patient has what appears to be quite a mild COPD exacerbation.  He has wheezing and short of breath a little bit more.  He has had some increasing cough.  He has not had any appreciable sputum change. -Prednisone 40 mg daily -Scheduled bronchodilators every 4  hours and as needed -Antibiotics per above -Continue LABA/ICS/L AMA -Consider initiation of Roflumilast  #) Candidal intertrigo: Patient  has evidence of candidal intertrigo on exam -Nystatin powder to groin 3 times daily  #) Hypertension/hyperlipidemia: -Continue amlodipine 5 mg daily -Continue atorvastatin 10 mg daily -Continue carvedilol 25 mg twice daily  #) Paroxysmal atrial fibrillation: -Continue beta-blocker per above -Continue apixaban 2.5 mg twice daily  #) Coronary artery disease: Apparently untreated as he was not a candidate for CABG -Continue DO AC, statin, beta-blocker  #) Status post bioprosthetic AVR: -Continue apixaban per above  #) Disorder with psychotic features: -Continue bupropion 300 mg daily -Continue buspirone 15 mg twice daily -Continue lamotrigine 200 mg every morning and 100 mg nightly -Continue escitalopram 20 mg daily -Continue clonazepam 0.5 mg nightly -Continue mirtazapine 15 mg daily  #) BPH: -Continue dutasteride 0.5 mg daily  #) Chronic kidney disease stage III: -Stable  #(Hypothyroidism: -Continue levothyroxine 175 mcg daily  Fluids: Tolerating p.o. Electrolytes: Monitor and supplement Nutrition: Heart healthy diet  Prophylaxis: On apixaban  Disposition: Pending improvement of cellulitis and COPD exacerbation  Full code  Cristy Folks MD [facility]  If 7PM-7AM, please contact night-coverage [email or web address] Password [facility]  01/05/2018, 6:49 PM

## 2018-01-05 NOTE — ED Triage Notes (Signed)
Patient here from home with complaints of right leg pain. Reports there is a wound and it is leaking fluid. Red and inflamed.

## 2018-01-05 NOTE — ED Provider Notes (Signed)
[facility] DEPT Provider Note   CSN: [id number] Arrival date & time: 01/05/18  1544     History   Chief Complaint Chief Complaint  Patient presents with  . Leg Swelling  . Wound Infection    HPI [name] [name] is a 84 y.o. male who presents with right leg swelling and redness. PMH significant for A.fib on Xarelto, COPD, CAD, CKD, aortic valve replacement. He states that for the past three weeks he has had swelling of his right leg. About a week and a half ago he started to have redness and drainage from his leg. Nothing makes it better or worse. He went to UC today who advised him to come to the ED. He denies significant pain or fever. He also has chronic SOB and cough which seems to be a little worse. No chest pain or abdominal pain.  HPI  Past Medical History:  Diagnosis Date  . Anxiety   . Aortic valve insufficiency   . Arthritis    "wee bit; knees, elbows" (10/15/2014)  . Atrial fibrillation ([place])   . Cardiomyopathy ([place])   . Chronic kidney disease    "down to ~ 1/2 of their regular use; I see kidney dr. in [place]" (10/15/2014)  . Complication of anesthesia    unsure of complications but pt. states there were complications  . COPD (chronic obstructive pulmonary disease) ([place])   . Coronary artery disease   . Decreased testosterone level   . Depression   . Difficult intubation    difficult intubation 12/03/09 and 03/27/10 ([facility]); glidescope used 03/27/10  . DVT (deep venous thrombosis) ([place]) ~ 2013   "BLE"  . Dysrhythmia    A. Fib  . Emphysema of lung ([place])   . Enlarged prostate   . Falls frequently    > 20 times in the last year/notes 10/15/2014  . GERD (gastroesophageal reflux disease)   . Heart murmur   . History of blood transfusion 1960   "lots; related to an accident"  . History of echocardiogram    post AVR >> Echo 7/16:  Mild LVH, EF 20-25%, AVR ok (peak 15 mmHg, mean 9 mmHg), MAC, mild MR, severe LAE, mild reduced RVSF, mild  RAE, PASP 35 mmHg  . Hypercholesteremia   . Hypertension   . Hypothyroidism   . OSA (obstructive sleep apnea)    "suppose to wear mask; I throw it off in my sleep" (10/15/2014)  . Plantar fasciitis   . Right fibular fracture 10/15/2014  . Seasonal allergies   . Shortness of breath dyspnea   . Syncope 10/15/2014  . Syncope and collapse "several times"  . Urine frequency   . Urine incontinence   . Varicose veins     Patient Active Problem List   Diagnosis Date Noted  . Pre-diabetes 01/07/2017  . Pneumonia 01/07/2017  . COPD (chronic obstructive pulmonary disease) with chronic bronchitis ([place]) 12/08/2016  . Elevated hemidiaphragm 12/08/2016  . Enlarged thoracic aorta ([place]) 12/03/2016  . Chronic diastolic heart failure ([place]) 12/03/2016  . Pulmonary nodule 12/03/2016  . Orthostatic hypotension 11/08/2015  . CKD (chronic kidney disease) 03/18/2015  . S/P AVR 02/11/2015  . Ataxia 12/05/2014  . Abnormality of gait 12/05/2014  . Neck pain, chronic 12/05/2014  . Abnormal carotid duplex scan 10/28/2014  . Obstructive sleep apnea 10/28/2014  . Mood disorder with psychosis ([place]) 10/28/2014  . Episodes of formed visual hallucinations 10/16/2014  . Hypoxia 10/15/2014  . CAD (coronary artery disease), native coronary artery 09/29/2014  .  Falls frequently 09/29/2014  . Essential hypertension   . Hyperlipidemia, unspecified   . AR (aortic regurgitation)   . Depression   . Hypothyroidism 06/20/2014  . Exertional dyspnea 04/29/2014  . Obesity (BMI 30-39.9) 08/08/2012  . Anxiety 08/03/2012  . Hydrocele 07/14/2012  . Abdominal aortic aneurysm ([place]) 09/08/2011  . BPH (benign prostatic hyperplasia) 09/08/2011  . GERD (gastroesophageal reflux disease) 09/08/2011    Past Surgical History:  Procedure Laterality Date  . ABDOMINAL AORTIC ANEURYSM REPAIR  01/2006; 03/10/2006   Archie Endo 01/12/2011  . AORTIC VALVE REPLACEMENT N/A 02/11/2015   Procedure: AORTIC VALVE REPLACEMENT (AVR);  Surgeon:  Grace Isaac, MD;  Location: [facility];  Service: Open Heart Surgery;  Laterality: N/A;  . BRAIN SURGERY  1960 X 4   "S/P got my skull busted"; pt. states removed internal carotid artery  . CARDIAC CATHETERIZATION  1990's X 1; 09/2014   no stents  . CLIPPING OF ATRIAL APPENDAGE N/A 02/11/2015   Procedure: CLIPPING OF ATRIAL APPENDAGE;  Surgeon: Grace Isaac, MD;  Location: [facility];  Service: Open Heart Surgery;  Laterality: N/A;  . ERCP  11/2009   Archie Endo 12/05/2009  . gall stone    . HERNIA REPAIR    . LAPAROSCOPIC CHOLECYSTECTOMY  08/2009   w/IOC/notes 09/18/2009  . LAPAROSCOPIC INCISIONAL / UMBILICAL / VENTRAL HERNIA REPAIR  02/2010   VHR w/mesh/notes 03/28/2010  . NOSE SURGERY    . TEE WITHOUT CARDIOVERSION N/A 02/11/2015   Procedure: TRANSESOPHAGEAL ECHOCARDIOGRAM (TEE);  Surgeon: Grace Isaac, MD;  Location: [facility];  Service: Open Heart Surgery;  Laterality: N/A;  . TONSILLECTOMY          Home Medications    Prior to Admission medications   Medication Sig Start Date End Date Taking? Authorizing Provider  amLODipine (NORVASC) 2.5 MG tablet Take 2 tablets (5 mg total) by mouth daily. 01/04/17   Nahser, [name], MD  atorvastatin (LIPITOR) 10 MG tablet Take 1 tablet (10 mg total) by mouth daily. 02/28/15   Angiulli, Lavon Paganini, PA-C  buPROPion (WELLBUTRIN XL) 300 MG 24 hr tablet Take 1 tablet (300 mg total) by mouth daily. 02/28/15   Angiulli, Lavon Paganini, PA-C  busPIRone (BUSPAR) 15 MG tablet Take 1 tablet (15 mg total) by mouth 2 (two) times daily. 02/28/15   Angiulli, Lavon Paganini, PA-C  carvedilol (COREG) 25 MG tablet TAKE 1 TABLET BY MOUTH TWICE A DAY 09/20/17   Nahser, [name], MD  clonazePAM (KLONOPIN) 0.5 MG tablet Take 1 tablet (0.5 mg total) by mouth at bedtime. 02/28/15   Angiulli, Lavon Paganini, PA-C  doxycycline (VIBRAMYCIN) 100 MG capsule Take 1 capsule (100 mg total) by mouth 2 (two) times daily. One po bid x 7 days 08/17/17   Ripley Fraise, MD  dutasteride (AVODART) 0.5 MG capsule Take  1 capsule (0.5 mg total) by mouth daily. 07/16/15   Meredith Staggers, MD  ELIQUIS 2.5 MG TABS tablet TAKE 1 TABLET (2.5 MG TOTAL) BY MOUTH 2 (TWO) TIMES DAILY. 12/19/17   Nahser, [name], MD  escitalopram (LEXAPRO) 20 MG tablet Take 1 tablet (20 mg total) by mouth daily. 02/28/15   Angiulli, Lavon Paganini, PA-C  folic acid (FOLVITE) 1 MG tablet Take 1 tablet (1 mg total) by mouth daily. 08/13/15   Meredith Staggers, MD  furosemide (LASIX) 40 MG tablet Take 1 tablet (40 mg total) by mouth daily. 10/26/16 08/17/17  Bhagat, Bhavinkumar, PA  KLOR-CON M10 10 MEQ tablet TAKE 1 TABLET (10 MEQ  TOTAL) BY MOUTH DAILY. 06/22/17   Nahser, [name], MD  lamoTRIgine (LAMICTAL) 100 MG tablet Take 1 tablet (100 mg total) by mouth 2 (two) times daily. Breakfast and dinner 02/28/15   Angiulli, Lavon Paganini, PA-C  levothyroxine (SYNTHROID, LEVOTHROID) 150 MCG tablet Take 150 mcg by mouth daily before breakfast.     [provider]  mirtazapine (REMERON) 7.5 MG tablet Take 7.5 mg by mouth daily. 10/13/15   [provider]  omeprazole (PRILOSEC) 20 MG capsule Take 1 capsule (20 mg total) by mouth daily. 12/08/16   Juanito Doom, MD  potassium chloride (K-DUR) 10 MEQ tablet Take 1 tablet (10 mEq total) by mouth daily. Patient not taking: Reported on 08/17/2017 10/26/16   Leanor Kail, PA  Tiotropium Bromide-Olodaterol (STIOLTO RESPIMAT) 2.5-2.5 MCG/ACT AERS Inhale 2 puffs into the lungs daily. 02/23/17   Juanito Doom, MD    Family History Family History  Problem Relation Age of Onset  . Rheum arthritis Mother   . Diabetes Mother   . Heart disease Father   . Rheum arthritis Father   . Kidney disease Father   . Kidney failure Brother   . Breast cancer Sister   . Other Sister        Murdered  . Depression Sister   . Cancer Sister   . Stroke Sister   . Colon cancer Neg Hx   . Colon polyps Neg Hx   . Liver disease Neg Hx     Social History Social History   Tobacco Use  . Smoking  status: Former Smoker    Packs/day: 2.00    Years: 22.00    Pack years: 44.00    Types: Cigarettes    Last attempt to quit: 04/29/1974    Years since quitting: 43.7  . Smokeless tobacco: Never Used  Substance Use Topics  . Alcohol use: Yes    Alcohol/week: 0.0 oz    Comment: 1 beer per year   . Drug use: No     Allergies   Patient has no known allergies.   Review of Systems Review of Systems  Constitutional: Negative for fever.  Respiratory: Positive for cough, shortness of breath and wheezing.   Cardiovascular: Positive for leg swelling. Negative for chest pain.  Gastrointestinal: Negative for abdominal pain.  Skin: Positive for color change and wound.  All other systems reviewed and are negative.    Physical Exam Updated Vital Signs BP 138/85 (BP Location: Left Arm)   Pulse 89   Temp 98.1 F (36.7 C) (Oral)   Resp 18   SpO2 95%   Physical Exam  Constitutional: He is oriented to person, place, and time. He appears well-developed and well-nourished. No distress.  HENT:  Head: Normocephalic and atraumatic.  Eyes: Pupils are equal, round, and reactive to light. Conjunctivae are normal. Right eye exhibits no discharge. Left eye exhibits no discharge. No scleral icterus.  Neck: Normal range of motion.  Cardiovascular: Normal rate and regular rhythm.  Pulmonary/Chest: Effort normal. Tachypnea (mildly) noted. No respiratory distress. He has wheezes.  Abdominal: He exhibits no distension.  Musculoskeletal:  Significant edema of the right leg compared to right. There is erythema from the foot to the thigh with thickened skin and macerated areas around the ankle. There is also pitting edema of the left leg.   Neurological: He is alert and oriented to person, place, and time.  Skin: Skin is warm and dry.  Psychiatric: He has a normal mood and affect. His behavior  is normal.  Nursing note and vitals reviewed.        ED Treatments / Results  Labs (all labs ordered  are listed, but only abnormal results are displayed) Labs Reviewed  COMPREHENSIVE METABOLIC PANEL - Abnormal; Notable for the following components:      Result Value   BUN 22 (*)    Creatinine, Ser 1.70 (*)    Calcium 8.6 (*)    ALT 12 (*)    Alkaline Phosphatase 139 (*)    Total Bilirubin 1.4 (*)    GFR calc non Af Amer 35 (*)    GFR calc Af Amer 41 (*)    All other components within normal limits  CBC WITH DIFFERENTIAL/PLATELET  URINALYSIS, ROUTINE W REFLEX MICROSCOPIC  I-STAT CG4 LACTIC ACID, ED  I-STAT CG4 LACTIC ACID, ED    EKG None  Radiology No results found.  Procedures Procedures (including critical care time)  Medications Ordered in ED Medications  predniSONE (DELTASONE) tablet 60 mg (has no administration in time range)  ceFAZolin (ANCEF) IVPB 1 g/50 mL premix (has no administration in time range)  ipratropium-albuterol (DUONEB) 0.5-2.5 (3) MG/3ML nebulizer solution 3 mL (3 mLs Nebulization Given 01/05/18 1841)     Initial Impression / Assessment and Plan / ED Course  I have reviewed the triage vital signs and the nursing notes.  Pertinent labs & imaging results that were available during my care of the patient were reviewed by me and considered in my medical decision making (see chart for details).  84 year old male presents with right leg swelling and redness and drainage for several weeks.  Exam is consistent with cellulitis.  His vital signs are normal.  He does have tachypnea and mild wheezing on exam.  His right leg is very swollen compared to the left and has redness going up to his thigh.  Blood work obtained is reassuring.  His CBC is normal.  His kidney function is around his baseline.  His lactic acid is normal.  She had visit with Dr. Ellender Hose.  We will start IV antibiotics and obtain chest x-ray and treat for COPD exacerbation.  Spoke with Dr. Herbert Moors who will admit.  Final Clinical Impressions(s) / ED Diagnoses   Final diagnoses:  Cellulitis of right  lower extremity  COPD exacerbation [facility])    ED Discharge Orders    None       Recardo Evangelist, PA-C 01/05/18 1851    Duffy Bruce, MD 01/08/18 1257

## 2018-01-05 NOTE — Progress Notes (Signed)
Patient in office to day for triage. He walked in stating that he had infection in his right leg. Redness to mid calf with large amount of swelling to mid thigh noticed. Redness and swelling started about 3 weeks ago with drainage starting just last week. Patient is having trouble with walking due to shortness of breath and weakness.  Patient has history of COPC last appointment with Pulmonology was 02/23/17 with no follow up. He has noticed change in breathing in the last few days with increased dizziness with walking.   All information has been reviewed along with up to date medication list with Dr. Jimmey Ralph who advises patient go to ED via EMS. I have informed patient of Dr. Lavone Neri recommendations and he declined. His wife is at home and needs care. "I have left her alone to long already" Did have patient fill out AMA that has been sent to scan. He will try and find someone to go sit with his wife so he can go to ED. I have informed him that this could be life threatening and he should go to ED as soon as possible.

## 2018-01-05 NOTE — ED Notes (Signed)
ADMITTING Provider at bedside. 

## 2018-01-06 ENCOUNTER — Other Ambulatory Visit: Payer: Self-pay

## 2018-01-06 ENCOUNTER — Inpatient Hospital Stay (HOSPITAL_COMMUNITY): Payer: Medicare Other

## 2018-01-06 DIAGNOSIS — R609 Edema, unspecified: Secondary | ICD-10-CM

## 2018-01-06 DIAGNOSIS — M7989 Other specified soft tissue disorders: Secondary | ICD-10-CM

## 2018-01-06 DIAGNOSIS — M79609 Pain in unspecified limb: Secondary | ICD-10-CM

## 2018-01-06 DIAGNOSIS — L03115 Cellulitis of right lower limb: Principal | ICD-10-CM

## 2018-01-06 LAB — BASIC METABOLIC PANEL WITH GFR
BUN: 22 mg/dL — ABNORMAL HIGH (ref 6–20)
Calcium: 8.6 mg/dL — ABNORMAL LOW (ref 8.9–10.3)
GFR calc non Af Amer: 35 mL/min — ABNORMAL LOW (ref >60)
Glucose, Bld: 192 mg/dL — ABNORMAL HIGH (ref 65–99)
Potassium: 4.8 mmol/L (ref 3.5–5.1)

## 2018-01-06 LAB — BASIC METABOLIC PANEL
Anion gap: 10 (ref 5–15)
CO2: 26 mmol/L (ref 22–32)
Chloride: 104 mmol/L (ref 101–111)
Creatinine, Ser: 1.7 mg/dL — ABNORMAL HIGH (ref 0.61–1.24)
GFR calc Af Amer: 41 mL/min — ABNORMAL LOW (ref >60)
Sodium: 140 mmol/L (ref 135–145)

## 2018-01-06 LAB — CBC
HCT: 37.9 % — ABNORMAL LOW (ref 39.0–52.0)
Hemoglobin: 11.7 g/dL — ABNORMAL LOW (ref 13.0–17.0)
MCH: 29.8 pg (ref 26.0–34.0)
MCHC: 30.9 g/dL (ref 30.0–36.0)
MCV: 96.7 fL (ref 78.0–100.0)
Platelets: 220 10*3/uL (ref 150–400)
RBC: 3.92 MIL/uL — ABNORMAL LOW (ref 4.22–5.81)
RDW: 14.6 % (ref 11.5–15.5)
WBC: 5.7 10*3/uL (ref 4.0–10.5)

## 2018-01-06 MED ORDER — AMMONIUM LACTATE 12 % EX LOTN
TOPICAL_LOTION | Freq: Every morning | CUTANEOUS | Status: DC
  Administered 2018-01-06 – 2018-01-09 (×4): via TOPICAL
  Filled 2018-01-06: qty 225

## 2018-01-06 MED ORDER — IPRATROPIUM-ALBUTEROL 0.5-2.5 (3) MG/3ML IN SOLN
3.0000 mL | Freq: Three times a day (TID) | RESPIRATORY_TRACT | Status: DC
  Administered 2018-01-06 – 2018-01-10 (×10): 3 mL via RESPIRATORY_TRACT
  Filled 2018-01-06 (×12): qty 3

## 2018-01-06 NOTE — Progress Notes (Signed)
Spoke with patient at bedside. He states he is primary caregiver for his wife who has dementia, bipolar, she also has severe arthritis. He also states he has fallen frequently, per son related to his heart valve which he had surgery on 3 yrs ago. He has been to SNF before and after that surgery but he has not been since. He has a walker but does not use it, he has a cane which he uses. His son assist with medications and meals, also takes him to the doctor. His son states he would like some assistance with medication management and interested in trying to reduce his meds if able. Will request [facility] nurse for med management and PT d/t frequent falls and deconditioned state. May need SNF, will await PT evaluation.

## 2018-01-06 NOTE — Progress Notes (Signed)
PROGRESS NOTE    [name] [name]  YQI:[id number] DOB: 09/03/1931 DOA: 01/05/2018 PCP: Henrine Screws, MD    Brief Narrative:  84 y.o. male with medical history significant of COPD, AAA status post open repair in 2007, severe AI status post bioprosthetic AVR, paroxysmal atrial fibrillation status post left atrial appendage clipping on apixaban, chronic kidney disease stage III, obstructive sleep apnea noncompliant with CPAP, coronary artery disease (70% distal RCA occlusion on cardiac catheterization), hypertension, hyperlipidemia, benign prostatic hypertrophy, depression/anxiety with psychotic features, hypothyroidism, resolved chronic systolic heart failure presents to the ED with right lower extremity cellulitis.   Assessment & Plan:   Active Problems:   Cellulitis - Pt is currently on Cefepime and clindamycin - improving on this regimen - 1 more day of IV antibiotics and plan on discharging next a.m. with continued improvement. - Most likely secondary to poor circulation    COPD (chronic obstructive pulmonary disease) with chronic bronchitis (HCC) -Patient is currently on Dulera, prednisone, bronchodilators and improving on this regimen.  We will plan on continuing    Paroxysmal A-fib (H[facility]) -Currently rate controlled on beta-blocker -Continue anticoagulation with Eliquis.    Hypothyroidism -No hypothyroid symptoms reported continue Synthroid.    Essential hypertension - Stable on current regimen    Hyperlipidemia, unspecified - stable on lipitor    CAD (coronary artery disease), native coronary artery -  No chest pain on statin    Obstructive sleep apnea   Mood disorder with psychosis ([facility])   S/P AVR   CKD (chronic kidney disease)   BPH (benign prostatic hyperplasia)   Obesity (BMI 30-39.9)  DVT prophylaxis: Eliquis Code Status: Full Family Communication: No family bedside Disposition Plan: Pending improvement in condition   Consultants:   none   Procedures:  none   Antimicrobials: Cefepime and clindamycin   Subjective: Pt has no new complaints. States that his breathing condition is improved and his lower extremity rash looks better.   Objective: Vitals:   01/05/18 2159 01/06/18 0600 01/06/18 1004 01/06/18 1128  BP:  (!) 147/102  130/81  Pulse:  99 (!) 105 91  Resp:  Temp:  97.7 F (36.5 C)  (!) 97.3 F (36.3 C)  TempSrc:  Oral  Oral  SpO2: 98% 92% 96% 92%  Weight:      Height:        Intake/Output Summary (Last 24 hours) at 01/06/2018 1555 Last data filed at 01/06/2018 0630 Gross per 24 hour  Intake 490 ml  Output 200 ml  Net 290 ml   Filed Weights   01/05/18 2100  Weight: (!) 143 kg (315 lb 4.1 oz)    Examination:  General exam: Appears calm and comfortable  Respiratory system: Clear to auscultation. Respiratory effort normal. Cardiovascular system: S1 & S2 heard, RRR. No JVD, murmurs, rubs Gastrointestinal system: Abdomen is nondistended, soft and nontender. No organomegaly or masses felt. Normal bowel sounds heard. Central nervous system: Alert and oriented. No focal neurological deficits. Extremities: Symmetric 5 x 5 power. Skin: brawny edema on both extremities, with cellulitis on RLE Psychiatry: Mood & affect appropriate.     Data Reviewed: I have personally reviewed following labs and imaging studies  CBC: Recent Labs  Lab 01/05/18 1615 01/06/18 0436  WBC 6.5 5.7  NEUTROABS 4.3  --   HGB 13.0 11.7*  HCT 39.7 37.9*  MCV 93.6 96.7  PLT 275 220   Basic Metabolic Panel: Recent Labs  Lab 01/05/18 1615 01/06/18 0436  NA 139 140  K 4.8 4.8  CL 104 104  CO2 25 26  GLUCOSE 95 192*  BUN 22* 22*  CREATININE 1.70* 1.70*  CALCIUM 8.6* 8.6*   GFR: Estimated Creatinine Clearance: 47.5 mL/min (A) (by C-G formula based on SCr of 1.7 mg/dL (H)). Liver Function Tests: Recent Labs  Lab 01/05/18 1615  AST 33  ALT 12*  ALKPHOS 139*  BILITOT 1.4*  PROT 7.9  ALBUMIN 3.7   No results for  input(s): LIPASE, AMYLASE in the last 168 hours. No results for input(s): AMMONIA in the last 168 hours. Coagulation Profile: No results for input(s): INR, PROTIME in the last 168 hours. Cardiac Enzymes: No results for input(s): CKTOTAL, CKMB, CKMBINDEX, TROPONINI in the last 168 hours. BNP (last 3 results) No results for input(s): PROBNP in the last 8760 hours. HbA1C: No results for input(s): HGBA1C in the last 72 hours. CBG: No results for input(s): GLUCAP in the last 168 hours. Lipid Profile: No results for input(s): CHOL, HDL, LDLCALC, TRIG, CHOLHDL, LDLDIRECT in the last 72 hours. Thyroid Function Tests: No results for input(s): TSH, T4TOTAL, FREET4, T3FREE, THYROIDAB in the last 72 hours. Anemia Panel: No results for input(s): VITAMINB12, FOLATE, FERRITIN, TIBC, IRON, RETICCTPCT in the last 72 hours. Sepsis Labs: Recent Labs  Lab 01/05/18 1625 01/05/18 1906  LATICACIDVEN 1.31 1.33    No results found for this or any previous visit (from the past 240 hour(s)).       Radiology Studies: Dg Chest 2 View  Result Date: 01/05/2018 CLINICAL DATA:  Short of breath. Change in breathing over the last several days. Dizziness with walking. EXAM: CHEST - 2 VIEW COMPARISON:  04/02/2015 FINDINGS: Stable changes from prior cardiac surgery. Cardiac silhouette is mildly enlarged, and appears larger than it did on the prior chest radiographs. No mediastinal or hilar masses. Mild linear atelectasis or scarring at the right lung base. Lungs otherwise clear. No pleural effusion or pneumothorax. Skeletal structures are grossly intact. IMPRESSION: 1. No acute cardiopulmonary disease. 2. Mild cardiomegaly. Cardiac silhouette appears mildly increased from the prior chest radiographs. Electronically Signed   By: Amie Portland M.D.   On: 01/05/2018 19:35        Scheduled Meds: . amLODipine  5 mg Oral Daily  . ammonium lactate   Topical q morning - 10a  . apixaban  2.5 mg Oral BID  .  atorvastatin  10 mg Oral Daily  . buPROPion  300 mg Oral Daily  . busPIRone  15 mg Oral BID  . carvedilol  25 mg Oral BID  . clonazePAM  0.5 mg Oral QHS  . dutasteride  0.5 mg Oral Daily  . escitalopram  20 mg Oral Daily  . ipratropium-albuterol  3 mL Nebulization TID  . lamoTRIgine  100 mg Oral BID WC  . levothyroxine  150 mcg Oral QAC breakfast  . mirtazapine  15 mg Oral Daily  . mometasone-formoterol  2 puff Inhalation BID  . nystatin   Topical TID  . potassium chloride  10 mEq Oral Daily  . predniSONE  40 mg Oral Q breakfast  . sodium chloride flush  3 mL Intravenous Q12H   Continuous Infusions: . sodium chloride    . ceFEPime (MAXIPIME) IV Stopped (01/06/18 1551)  . clindamycin (CLEOCIN) IV 600 mg (01/06/18 1500)     LOS: 1 day    Time spent: > 30 min  Penny Pia, MD [facility] Pager [phone]  If 7PM-7AM, please contact night-coverage [email or web address] Password [email or web address] 01/06/2018, 3:55 PM

## 2018-01-06 NOTE — Progress Notes (Addendum)
Preliminary notes--Right lower extremity venous duplex study completed. Negative for DVT.  Severe edema noted. A hyperechoic non-vascular  structure seen at right groin area, measuring 1.52x1.32x0.79cm.   Hongying Richardson Dopp (RDMS RVT) 01/06/18 12:23 PM

## 2018-01-06 NOTE — Consult Note (Signed)
WOC Nurse wound consult note Reason for Consult: right lower leg wounds Wound type: Venous insufficiency The right lower leg exhibits changes associated with chronic venous insufficiency including:  Edema, hemosiderin staining, weeping of serous fluid, dry/crusting skin, gaiter areas with loss of epithelial tissue, bullae, and erythema.  The left lower leg is beginning to exhibit similar characteristics, however the left leg does not have open areas, nor does it have erythema. Individual orders have been entered for each leg.  The right leg plan of care includes: Wash right leg with warm soapy water.  Pat dry.  Apply LacHydrin lotion to leg.  Place Xeroform gauzes [place] # [phone]) to areas at the ankle, shin, posterior leg (It will take more than one).  Cover with ABD pads.  Beginning just behind the toes, spiral wrap kerlex to just below the knee (It will take at least two rolls).  Then spiral wrap ACE wraps in the same manner.  It will take two ACE wraps also.  Change daily.  The left leg plan of care includes:  Wash left leg with warm soapy water.  Pat dry.  Apply LacHydrin lotion to leg.  Beginning just behind the toes, spiral wrap kerlex to just below the knee (It will take at least two rolls).  Then spiral wrap ACE wraps in the same manner.  It will take two ACE wraps also.  Change daily.  If the patient remains in the hospital on Monday, and if he tolerates this modified approach to compression, we can consider using Unna Boots until he can be fitted for prescribed compression hose. Monitor the wound area(s) for worsening of condition such as: Signs/symptoms of infection,  Increase in size,  Development of or worsening of odor, Development of pain, or increased pain at the affected locations.  Notify the medical team if any of these develop.  Thank you for the consult.  Discussed plan of care with the patient and bedside nurse.   Helmut Muster, RN, MSN, CWOCN, CNS-BC, pager [phone]

## 2018-01-07 DIAGNOSIS — J441 Chronic obstructive pulmonary disease with (acute) exacerbation: Secondary | ICD-10-CM

## 2018-01-07 NOTE — Progress Notes (Signed)
PROGRESS NOTE    [name] [name]  WUJ:[id number] DOB: 11/12/1931 DOA: 01/05/2018 PCP: Henrine Screws, MD    Brief Narrative:  84 y.o. male with medical history significant of COPD, AAA status post open repair in 2007, severe AI status post bioprosthetic AVR, paroxysmal atrial fibrillation status post left atrial appendage clipping on apixaban, chronic kidney disease stage III, obstructive sleep apnea noncompliant with CPAP, coronary artery disease (70% distal RCA occlusion on cardiac catheterization), hypertension, hyperlipidemia, benign prostatic hypertrophy, depression/anxiety with psychotic features, hypothyroidism, resolved chronic systolic heart failure presents to the ED with right lower extremity cellulitis.   Assessment & Plan:   Active Problems:   Cellulitis - Pt is currently on Cefepime and clindamycin - improving on this regimen - 1 more day of IV antibiotics and plan on discharging next a.m. with continued improvement. - Most likely secondary to poor circulation    COPD (chronic obstructive pulmonary disease) with chronic bronchitis (HCC) - Patient is currently on Dulera, prednisone, bronchodilators and improving on this regimen.   - continue current regimen. - prn oxygen    Paroxysmal A-fib (HCC) -Currently rate controlled on beta-blocker -Continue anticoagulation with Eliquis.    Hypothyroidism -No hypothyroid symptoms reported continue Synthroid.    Essential hypertension - Stable on current regimen    Hyperlipidemia, unspecified - stable on lipitor    CAD (coronary artery disease), native coronary artery -  No chest pain on statin    Obstructive sleep apnea   Mood disorder with psychosis (HCC)   S/P AVR   CKD (chronic kidney disease)   BPH (benign prostatic hyperplasia)   Obesity (BMI 30-39.9)  DVT prophylaxis: Eliquis Code Status: Full Family Communication: No family bedside Disposition Plan: Pending improvement in condition   Consultants:    none   Procedures: none   Antimicrobials: Cefepime and clindamycin   Subjective: Pt has no new complaints. States that his breathing condition is improved and his lower extremity rash looks better.   Objective: Vitals:   01/06/18 2131 01/07/18 0551 01/07/18 0842 01/07/18 1427  BP:  (!) 143/99  (!) 133/92  Pulse:  83  99  Resp:  18  19  Temp:  97.8 F (36.6 C)  98.4 F (36.9 C)  TempSrc:  Oral  Oral  SpO2: 98% 93% 94% 93%  Weight:      Height:        Intake/Output Summary (Last 24 hours) at 01/07/2018 1504 Last data filed at 01/07/2018 1040 Gross per 24 hour  Intake 220 ml  Output 350 ml  Net -130 ml   Filed Weights   01/05/18 2100  Weight: (!) 143 kg (315 lb 4.1 oz)    Examination:  General exam: Appears calm and comfortable  Respiratory system: Clear to auscultation. Respiratory effort normal. Cardiovascular system: S1 & S2 heard, RRR. No JVD, murmurs, rubs Gastrointestinal system: Abdomen is nondistended, soft and nontender. No organomegaly or masses felt. Normal bowel sounds heard. Central nervous system: Alert and oriented. No focal neurological deficits. Extremities: Symmetric 5 x 5 power. Skin: brawny edema on both extremities, with cellulitis on RLE Psychiatry: Mood & affect appropriate.     Data Reviewed: I have personally reviewed following labs and imaging studies  CBC: Recent Labs  Lab 01/05/18 1615 01/06/18 0436  WBC 6.5 5.7  NEUTROABS 4.3  --   HGB 13.0 11.7*  HCT 39.7 37.9*  MCV 93.6 96.7  PLT 275 220   Basic Metabolic Panel: Recent Labs  Lab 01/05/18 1615 01/06/18 0436  NA  139 140  K 4.8 4.8  CL 104 104  CO2 25 26  GLUCOSE 95 192*  BUN 22* 22*  CREATININE 1.70* 1.70*  CALCIUM 8.6* 8.6*   GFR: Estimated Creatinine Clearance: 47.5 mL/min (A) (by C-G formula based on SCr of 1.7 mg/dL (H)). Liver Function Tests: Recent Labs  Lab 01/05/18 1615  AST 33  ALT 12*  ALKPHOS 139*  BILITOT 1.4*  PROT 7.9  ALBUMIN 3.7    No results for input(s): LIPASE, AMYLASE in the last 168 hours. No results for input(s): AMMONIA in the last 168 hours. Coagulation Profile: No results for input(s): INR, PROTIME in the last 168 hours. Cardiac Enzymes: No results for input(s): CKTOTAL, CKMB, CKMBINDEX, TROPONINI in the last 168 hours. BNP (last 3 results) No results for input(s): PROBNP in the last 8760 hours. HbA1C: No results for input(s): HGBA1C in the last 72 hours. CBG: No results for input(s): GLUCAP in the last 168 hours. Lipid Profile: No results for input(s): CHOL, HDL, LDLCALC, TRIG, CHOLHDL, LDLDIRECT in the last 72 hours. Thyroid Function Tests: No results for input(s): TSH, T4TOTAL, FREET4, T3FREE, THYROIDAB in the last 72 hours. Anemia Panel: No results for input(s): VITAMINB12, FOLATE, FERRITIN, TIBC, IRON, RETICCTPCT in the last 72 hours. Sepsis Labs: Recent Labs  Lab 01/05/18 1625 01/05/18 1906  LATICACIDVEN 1.31 1.33    No results found for this or any previous visit (from the past 240 hour(s)).       Radiology Studies: Dg Chest 2 View  Result Date: 01/05/2018 CLINICAL DATA:  Short of breath. Change in breathing over the last several days. Dizziness with walking. EXAM: CHEST - 2 VIEW COMPARISON:  04/02/2015 FINDINGS: Stable changes from prior cardiac surgery. Cardiac silhouette is mildly enlarged, and appears larger than it did on the prior chest radiographs. No mediastinal or hilar masses. Mild linear atelectasis or scarring at the right lung base. Lungs otherwise clear. No pleural effusion or pneumothorax. Skeletal structures are grossly intact. IMPRESSION: 1. No acute cardiopulmonary disease. 2. Mild cardiomegaly. Cardiac silhouette appears mildly increased from the prior chest radiographs. Electronically Signed   By: Amie Portland M.D.   On: 01/05/2018 19:35        Scheduled Meds: . amLODipine  5 mg Oral Daily  . ammonium lactate   Topical q morning - 10a  . apixaban  2.5 mg Oral  BID  . atorvastatin  10 mg Oral Daily  . buPROPion  300 mg Oral Daily  . busPIRone  15 mg Oral BID  . carvedilol  25 mg Oral BID  . clonazePAM  0.5 mg Oral QHS  . dutasteride  0.5 mg Oral Daily  . escitalopram  20 mg Oral Daily  . ipratropium-albuterol  3 mL Nebulization TID  . lamoTRIgine  100 mg Oral BID WC  . levothyroxine  150 mcg Oral QAC breakfast  . mirtazapine  15 mg Oral Daily  . mometasone-formoterol  2 puff Inhalation BID  . nystatin   Topical TID  . potassium chloride  10 mEq Oral Daily  . predniSONE  40 mg Oral Q breakfast  . sodium chloride flush  3 mL Intravenous Q12H   Continuous Infusions: . sodium chloride    . ceFEPime (MAXIPIME) IV Stopped (01/07/18 1040)  . clindamycin (CLEOCIN) IV 600 mg (01/07/18 1412)     LOS: 2 days    Time spent: > 30 min  Penny Pia, MD [facility] Pager [phone]  If 7PM-7AM, please contact night-coverage [email or web address] Password [facility] 01/07/2018,  3:04 PM

## 2018-01-08 MED ORDER — TRAZODONE HCL 50 MG PO TABS
50.0000 mg | ORAL_TABLET | Freq: Once | ORAL | Status: AC
  Administered 2018-01-08: 50 mg via ORAL
  Filled 2018-01-08: qty 1

## 2018-01-08 NOTE — Progress Notes (Signed)
PROGRESS NOTE    [name] [name]  ZOX:[id number] DOB: [birth year] DOA: 01/05/2018 PCP: Henrine Screws, MD    Brief Narrative:  84 y.o. male with medical history significant of COPD, AAA status post open repair in 2007, severe AI status post bioprosthetic AVR, paroxysmal atrial fibrillation status post left atrial appendage clipping on apixaban, chronic kidney disease stage III, obstructive sleep apnea noncompliant with CPAP, coronary artery disease (70% distal RCA occlusion on cardiac catheterization), hypertension, hyperlipidemia, benign prostatic hypertrophy, depression/anxiety with psychotic features, hypothyroidism, resolved chronic systolic heart failure presents to the ED with right lower extremity cellulitis.   Assessment & Plan:   Active Problems:   Cellulitis - Pt is currently on Cefepime and clindamycin - improving on this regimen - Pt is still complaining of pain at leg and discomfort with standing. Will continue IV antibiotics for today and try placing on oral next am with continued improvement. - Most likely secondary to poor circulation    COPD (chronic obstructive pulmonary disease) with chronic bronchitis (HCC) - Patient is currently on Dulera, prednisone, bronchodilators and improving on this regimen.   - continue current regimen. - prn oxygen    Paroxysmal A-fib (HCC) -Currently rate controlled on beta-blocker -Continue anticoagulation with Eliquis.    Hypothyroidism -No hypothyroid symptoms reported continue Synthroid.    Essential hypertension - Stable on current regimen    Hyperlipidemia, unspecified - stable on lipitor    CAD (coronary artery disease), native coronary artery -  No chest pain on statin    Obstructive sleep apnea   Mood disorder with psychosis (HCC)   S/P AVR   CKD (chronic kidney disease)   BPH (benign prostatic hyperplasia)   Obesity (BMI 30-39.9)  DVT prophylaxis: Eliquis Code Status: Full Family Communication: No family  bedside Disposition Plan: Pending improvement in condition   Consultants:   none   Procedures: none   Antimicrobials: Cefepime and clindamycin   Subjective: Pt stll complaining of pain at RLE   Objective: Vitals:   01/08/18 0936 01/08/18 1403 01/08/18 1403 01/08/18 1444  BP: (!) 151/88 (!) 141/104 (!) 141/104   Pulse:  93 93   Resp:  18 18   Temp:  97.6 F (36.4 C) 97.6 F (36.4 C)   TempSrc:  Oral Oral   SpO2:  94% 94% 93%  Weight:      Height:        Intake/Output Summary (Last 24 hours) at 01/08/2018 1559 Last data filed at 01/08/2018 1000 Gross per 24 hour  Intake 122 ml  Output 850 ml  Net -728 ml   Filed Weights   01/05/18 2100  Weight: (!) 143 kg (315 lb 4.1 oz)    Examination: exam unchanged when compared to last on 01/07/18  General exam: Appears calm and comfortable  Respiratory system: Clear to auscultation. Respiratory effort normal. Cardiovascular system: S1 & S2 heard, RRR. No JVD, murmurs, rubs Gastrointestinal system: Abdomen is nondistended, soft and nontender. No organomegaly or masses felt. Normal bowel sounds heard. Central nervous system: Alert and oriented. No focal neurological deficits. Extremities: Symmetric 5 x 5 power. Skin: brawny edema on both extremities, with cellulitis on RLE Psychiatry: Mood & affect appropriate.     Data Reviewed: I have personally reviewed following labs and imaging studies  CBC: Recent Labs  Lab 01/05/18 1615 01/06/18 0436  WBC 6.5 5.7  NEUTROABS 4.3  --   HGB 13.0 11.7*  HCT 39.7 37.9*  MCV 93.6 96.7  PLT 275 220   Basic Metabolic Panel:  Recent Labs  Lab 01/05/18 1615 01/06/18 0436  NA 139 140  K 4.8 4.8  CL 104 104  CO2 25 26  GLUCOSE 95 192*  BUN 22* 22*  CREATININE 1.70* 1.70*  CALCIUM 8.6* 8.6*   GFR: Estimated Creatinine Clearance: 47.5 mL/min (A) (by C-G formula based on SCr of 1.7 mg/dL (H)). Liver Function Tests: Recent Labs  Lab 01/05/18 1615  AST 33  ALT 12*   ALKPHOS 139*  BILITOT 1.4*  PROT 7.9  ALBUMIN 3.7   No results for input(s): LIPASE, AMYLASE in the last 168 hours. No results for input(s): AMMONIA in the last 168 hours. Coagulation Profile: No results for input(s): INR, PROTIME in the last 168 hours. Cardiac Enzymes: No results for input(s): CKTOTAL, CKMB, CKMBINDEX, TROPONINI in the last 168 hours. BNP (last 3 results) No results for input(s): PROBNP in the last 8760 hours. HbA1C: No results for input(s): HGBA1C in the last 72 hours. CBG: No results for input(s): GLUCAP in the last 168 hours. Lipid Profile: No results for input(s): CHOL, HDL, LDLCALC, TRIG, CHOLHDL, LDLDIRECT in the last 72 hours. Thyroid Function Tests: No results for input(s): TSH, T4TOTAL, FREET4, T3FREE, THYROIDAB in the last 72 hours. Anemia Panel: No results for input(s): VITAMINB12, FOLATE, FERRITIN, TIBC, IRON, RETICCTPCT in the last 72 hours. Sepsis Labs: Recent Labs  Lab 01/05/18 1625 01/05/18 1906  LATICACIDVEN 1.31 1.33    No results found for this or any previous visit (from the past 240 hour(s)).       Radiology Studies: No results found.      Scheduled Meds: . amLODipine  5 mg Oral Daily  . ammonium lactate   Topical q morning - 10a  . apixaban  2.5 mg Oral BID  . atorvastatin  10 mg Oral Daily  . buPROPion  300 mg Oral Daily  . busPIRone  15 mg Oral BID  . carvedilol  25 mg Oral BID  . clonazePAM  0.5 mg Oral QHS  . dutasteride  0.5 mg Oral Daily  . escitalopram  20 mg Oral Daily  . ipratropium-albuterol  3 mL Nebulization TID  . lamoTRIgine  100 mg Oral BID WC  . levothyroxine  150 mcg Oral QAC breakfast  . mirtazapine  15 mg Oral Daily  . mometasone-formoterol  2 puff Inhalation BID  . nystatin   Topical TID  . potassium chloride  10 mEq Oral Daily  . predniSONE  40 mg Oral Q breakfast  . sodium chloride flush  3 mL Intravenous Q12H   Continuous Infusions: . sodium chloride 250 mL (01/07/18 2148)  . ceFEPime  (MAXIPIME) IV Stopped (01/08/18 1006)  . clindamycin (CLEOCIN) IV Stopped (01/08/18 1350)     LOS: 3 days    Time spent: > 30 min  Penny Pia, MD [facility] Pager [phone]  If 7PM-7AM, please contact night-coverage [email or web address] Password [facility] 01/08/2018, 3:59 PM

## 2018-01-09 NOTE — Consult Note (Signed)
WOC Nurse wound follow up Reason for Consult:Chronic venous insufficiency.  Patient has worn compression in the past.  WIll begin Unna boots today.  Wound type:venous insufficiency Wound bed: Scabbed crusted lesions to right lower leg, circumferentially.  Linear abrasions consistent with scaratching to lower leg just below knee.  Gernealized edema., chronic long term.  Will begin Unna boots.  Drainage (amount, consistency, odor) minimal serosanguinous Periwound:chronic skin changes bilateral lower legs Dressing procedure/placement/frequency: Cleanse bilateral lower legs with soap and water and pat dry.  Apply Xeroform gauze to nonitnact lesions and secure with zinc layer and self adherent coban.  Wrap from below toes to below knee (ortho tech) weekly on MOnday.  Will not follow at this time.  Please re-consult if needed.  Maple Hudson RN BSN [facility] Pager [phone]

## 2018-01-09 NOTE — Progress Notes (Signed)
PROGRESS NOTE    [name] [name]  ZOX:[id number] DOB: Jan 27, 1932 DOA: 01/05/2018 PCP: Henrine Screws, MD    Brief Narrative:  84 y.o. male with medical history significant of COPD, AAA status post open repair in 2007, severe AI status post bioprosthetic AVR, paroxysmal atrial fibrillation status post left atrial appendage clipping on apixaban, chronic kidney disease stage III, obstructive sleep apnea noncompliant with CPAP, coronary artery disease (70% distal RCA occlusion on cardiac catheterization), hypertension, hyperlipidemia, benign prostatic hypertrophy, depression/anxiety with psychotic features, hypothyroidism, resolved chronic systolic heart failure presents to the ED with right lower extremity cellulitis.   Assessment & Plan:   Active Problems:   Cellulitis - Pt is currently on Cefepime and clindamycin, Pt has had slow improvement. - Pt is still complaining of pain at leg and discomfort with standing. Will continue IV antibiotics. Most likely transition to oral antibiotics next am and d/c - Most likely secondary to poor circulation    COPD (chronic obstructive pulmonary disease) with chronic bronchitis (HCC) - Patient is currently on Dulera, bronchodilators and improving on this regimen.  Will discontinue prednisone - continue current regimen. - prn oxygen    Paroxysmal A-fib (HCC) -Currently rate controlled on beta-blocker -Continue anticoagulation with Eliquis.    Hypothyroidism -No hypothyroid symptoms reported continue Synthroid.    Essential hypertension - Stable on current regimen    Hyperlipidemia, unspecified - stable on lipitor    CAD (coronary artery disease), native coronary artery -  No chest pain on statin    Obstructive sleep apnea   Mood disorder with psychosis (HCC)   S/P AVR   CKD (chronic kidney disease)   BPH (benign prostatic hyperplasia)   Obesity (BMI 30-39.9)  DVT prophylaxis: Eliquis Code Status: Full Family Communication: No family  bedside Disposition Plan: Pending improvement in condition   Consultants:   none   Procedures: none   Antimicrobials: Cefepime and clindamycin   Subjective: Pt stll complaining of pain at RLE   Objective: Vitals:   01/09/18 1034 01/09/18 1036 01/09/18 1345 01/09/18 1524  BP: 133/83 133/83 112/73   Pulse:  84 74   Resp:  16 17   Temp:   97.8 F (36.6 C)   TempSrc:   Oral   SpO2:  94% (!) 82% 90%  Weight:      Height:        Intake/Output Summary (Last 24 hours) at 01/09/2018 1539 Last data filed at 01/09/2018 1400 Gross per 24 hour  Intake 700 ml  Output 300 ml  Net 400 ml   Filed Weights   01/05/18 2100  Weight: (!) 143 kg (315 lb 4.1 oz)    Examination: exam unchanged when compared to last on 01/07/18  General exam: Appears calm and comfortable  Respiratory system: Clear to auscultation. Respiratory effort normal. Cardiovascular system: S1 & S2 heard, RRR. No JVD, murmurs, rubs Gastrointestinal system: Abdomen is nondistended, soft and nontender. No organomegaly or masses felt. Normal bowel sounds heard. Central nervous system: Alert and oriented. No focal neurological deficits. Extremities: Symmetric 5 x 5 power. Skin: brawny edema on both extremities, with cellulitis on RLE Psychiatry: Mood & affect appropriate.     Data Reviewed: I have personally reviewed following labs and imaging studies  CBC: Recent Labs  Lab 01/05/18 1615 01/06/18 0436  WBC 6.5 5.7  NEUTROABS 4.3  --   HGB 13.0 11.7*  HCT 39.7 37.9*  MCV 93.6 96.7  PLT 275 220   Basic Metabolic Panel: Recent Labs  Lab 01/05/18 1615  01/06/18 0436  NA 139 140  K 4.8 4.8  CL 104 104  CO2 25 26  GLUCOSE 95 192*  BUN 22* 22*  CREATININE 1.70* 1.70*  CALCIUM 8.6* 8.6*   GFR: Estimated Creatinine Clearance: 47.5 mL/min (A) (by C-G formula based on SCr of 1.7 mg/dL (H)). Liver Function Tests: Recent Labs  Lab 01/05/18 1615  AST 33  ALT 12*  ALKPHOS 139*  BILITOT 1.4*  PROT  7.9  ALBUMIN 3.7   No results for input(s): LIPASE, AMYLASE in the last 168 hours. No results for input(s): AMMONIA in the last 168 hours. Coagulation Profile: No results for input(s): INR, PROTIME in the last 168 hours. Cardiac Enzymes: No results for input(s): CKTOTAL, CKMB, CKMBINDEX, TROPONINI in the last 168 hours. BNP (last 3 results) No results for input(s): PROBNP in the last 8760 hours. HbA1C: No results for input(s): HGBA1C in the last 72 hours. CBG: No results for input(s): GLUCAP in the last 168 hours. Lipid Profile: No results for input(s): CHOL, HDL, LDLCALC, TRIG, CHOLHDL, LDLDIRECT in the last 72 hours. Thyroid Function Tests: No results for input(s): TSH, T4TOTAL, FREET4, T3FREE, THYROIDAB in the last 72 hours. Anemia Panel: No results for input(s): VITAMINB12, FOLATE, FERRITIN, TIBC, IRON, RETICCTPCT in the last 72 hours. Sepsis Labs: Recent Labs  Lab 01/05/18 1625 01/05/18 1906  LATICACIDVEN 1.31 1.33    No results found for this or any previous visit (from the past 240 hour(s)).       Radiology Studies: No results found.      Scheduled Meds: . amLODipine  5 mg Oral Daily  . ammonium lactate   Topical q morning - 10a  . apixaban  2.5 mg Oral BID  . atorvastatin  10 mg Oral Daily  . buPROPion  300 mg Oral Daily  . busPIRone  15 mg Oral BID  . carvedilol  25 mg Oral BID  . clonazePAM  0.5 mg Oral QHS  . dutasteride  0.5 mg Oral Daily  . escitalopram  20 mg Oral Daily  . ipratropium-albuterol  3 mL Nebulization TID  . lamoTRIgine  100 mg Oral BID WC  . levothyroxine  150 mcg Oral QAC breakfast  . mirtazapine  15 mg Oral Daily  . mometasone-formoterol  2 puff Inhalation BID  . nystatin   Topical TID  . potassium chloride  10 mEq Oral Daily  . predniSONE  40 mg Oral Q breakfast  . sodium chloride flush  3 mL Intravenous Q12H   Continuous Infusions: . sodium chloride 250 mL (01/07/18 2148)  . ceFEPime (MAXIPIME) IV Stopped (01/09/18 1153)    . clindamycin (CLEOCIN) IV 600 mg (01/09/18 1515)     LOS: 4 days    Time spent: 25 min  Penny Pia, MD [facility] Pager [phone]  If 7PM-7AM, please contact night-coverage [email or web address] Password [facility] 01/09/2018, 3:39 PM

## 2018-01-09 NOTE — Progress Notes (Signed)
   01/09/18 1557  Clinical Encounter Type  Visited With Patient  Visit Type Initial  Referral From Patient;Nurse  Consult/Referral To Chaplain  Spiritual Encounters  Spiritual Needs Literature   Following up on a SCC for HCPA.  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  He was receptive to hearing about the HCPA and will discuss with his son this afternoon.  Patient indicated he lives with his wife for whom he cares who has Bipolar and Dementia.  [place].  I provided empathetic listening as he shared.  Let the patient know we can complete the form while he is here.  Will follow and support as needed. Chaplain Agustin Cree

## 2018-01-10 MED ORDER — DOXYCYCLINE HYCLATE 100 MG PO TABS: 100.0000 mg | ORAL_TABLET | Freq: Two times a day (BID) | ORAL | 0 refills | Status: AC

## 2018-01-10 MED ORDER — CEPHALEXIN 500 MG PO CAPS: 500.0000 mg | ORAL_CAPSULE | Freq: Three times a day (TID) | ORAL | 0 refills | Status: AC

## 2018-01-10 NOTE — Discharge Summary (Signed)
Physician Discharge Summary  Kwesi Sangha ZOX:[id number] DOB: February 01, 1932 DOA: 01/05/2018  PCP: Henrine Screws, MD  Admit date: 01/05/2018 Discharge date: 01/10/2018  Time spent: 35 minutes  Recommendations for Outpatient Follow-up:  1. Pt will be given 4 more days of doxycycline and keflex to complete a 10 day treatment course of antibiotics for his cellulitis 2. Alphia Kava will continue prior to admission medication regimen for his copd   Discharge Diagnoses:  Active Problems:   Cellulitis   COPD (chronic obstructive pulmonary disease) with chronic bronchitis (HCC)   Paroxysmal A-fib (HCC)   Hypothyroidism   Essential hypertension   Hyperlipidemia, unspecified   CAD (coronary artery disease), native coronary artery   Obstructive sleep apnea   Mood disorder with psychosis (HCC)   S/P AVR   CKD (chronic kidney disease)   BPH (benign prostatic hyperplasia)   Obesity (BMI 30-39.9)   Discharge Condition: stable  Diet recommendation: Heart healthy  Filed Weights   01/05/18 2100  Weight: (!) 143 kg (315 lb 4.1 oz)    History of present illness:  84 y.o.malewith medical history significant ofCOPD, AAA status post open repair in 2007, severe AI status post bioprosthetic AVR, paroxysmal atrial fibrillation status post left atrial appendage clipping on apixaban, chronic kidney disease stage III, obstructive sleep apnea noncompliant with CPAP, coronary artery disease (70% distal RCA occlusion on cardiac catheterization), hypertension, hyperlipidemia, benign prostatic hypertrophy, depression/anxiety with psychotic features, hypothyroidism, chronic systolic heart failure presents to the ED with right lower extremity cellulitis and copd exacerbation.  Hospital course COPD exacerbation - improved with administration of steroids, bronchodilators, inhaled steroids - Pt may go back on prior to admission medication regimen  Cellulitis - will op for a more prolonged course given his body  habitus and his slow improvement. D/c on doxycycline to cover MRSA and keflex to cover other gram positive organisms. Treat for 4 more days to complete a 10 day treatment course  Otherwise for chronic conditions listed above will continue medication regimen listed below.  Procedures:  none  Consultations:  Wound care nurse  Discharge Exam: Vitals:   01/10/18 1438 01/10/18 1452  BP: 126/67   Pulse: 76   Resp: 18 18  Temp:    SpO2: 93% 94%    General: Pt in nad, alert and awake Cardiovascular: rrr, no rubs Respiratory: no increased wob, no wheezes  Discharge Instructions   Discharge Instructions    Call MD for:  difficulty breathing, headache or visual disturbances   Complete by:  As directed    Call MD for:  temperature >100.4   Complete by:  As directed    Diet - low sodium heart healthy   Complete by:  As directed    Increase activity slowly   Complete by:  As directed      Allergies as of 01/10/2018   No Known Allergies     Medication List    TAKE these medications   amLODipine 2.5 MG tablet Commonly known as:  NORVASC Take 2 tablets (5 mg total) by mouth daily.   atorvastatin 10 MG tablet Commonly known as:  LIPITOR Take 1 tablet (10 mg total) by mouth daily.   buPROPion 300 MG 24 hr tablet Commonly known as:  WELLBUTRIN XL Take 1 tablet (300 mg total) by mouth daily.   busPIRone 15 MG tablet Commonly known as:  BUSPAR Take 1 tablet (15 mg total) by mouth 2 (two) times daily.   carvedilol 25 MG tablet Commonly known as:  COREG TAKE 1  TABLET BY MOUTH TWICE A DAY   cephALEXin 500 MG capsule Commonly known as:  KEFLEX Take 1 capsule (500 mg total) by mouth 3 (three) times daily for 4 days.   clonazePAM 0.5 MG tablet Commonly known as:  KLONOPIN Take 1 tablet (0.5 mg total) by mouth at bedtime.   doxycycline 100 MG tablet Commonly known as:  VIBRA-TABS Take 1 tablet (100 mg total) by mouth 2 (two) times daily for 4 days.   dutasteride 0.5  MG capsule Commonly known as:  AVODART Take 1 capsule (0.5 mg total) by mouth daily.   ELIQUIS 2.5 MG Tabs tablet Generic drug:  apixaban TAKE 1 TABLET (2.5 MG TOTAL) BY MOUTH 2 (TWO) TIMES DAILY.   escitalopram 20 MG tablet Commonly known as:  LEXAPRO Take 1 tablet (20 mg total) by mouth daily.   folic acid 1 MG tablet Commonly known as:  FOLVITE Take 1 tablet (1 mg total) by mouth daily.   furosemide 40 MG tablet Commonly known as:  LASIX Take 1 tablet (40 mg total) by mouth daily. What changed:  how much to take   KLOR-CON M10 10 MEQ tablet Generic drug:  potassium chloride TAKE 1 TABLET (10 MEQ TOTAL) BY MOUTH DAILY.   lamoTRIgine 100 MG tablet Commonly known as:  LAMICTAL Take 1 tablet (100 mg total) by mouth 2 (two) times daily. Breakfast and dinner What changed:    how much to take  when to take this  additional instructions   levothyroxine 175 MCG tablet Commonly known as:  SYNTHROID, LEVOTHROID Take 175 mcg by mouth daily before breakfast.   mirtazapine 7.5 MG tablet Commonly known as:  REMERON Take 15 mg by mouth daily.   omeprazole 20 MG capsule Commonly known as:  PRILOSEC Take 1 capsule (20 mg total) by mouth daily.   potassium chloride 10 MEQ tablet Commonly known as:  K-DUR Take 10 mEq by mouth daily.   potassium chloride 10 MEQ tablet Commonly known as:  K-DUR Take 1 tablet (10 mEq total) by mouth daily.   Tiotropium Bromide-Olodaterol 2.5-2.5 MCG/ACT Aers Commonly known as:  STIOLTO RESPIMAT Inhale 2 puffs into the lungs daily.      No Known Allergies Follow-up Information    [facility]   Specialty:  Home Health Services Why:  nurse and physical therapy Contact information: [address] [place] [address] [phone]            The results of significant diagnostics from this hospitalization (including imaging, microbiology, ancillary and laboratory) are listed below for  reference.    Significant Diagnostic Studies: Dg Chest 2 View  Result Date: 01/05/2018 CLINICAL DATA:  Short of breath. Change in breathing over the last several days. Dizziness with walking. EXAM: CHEST - 2 VIEW COMPARISON:  04/02/2015 FINDINGS: Stable changes from prior cardiac surgery. Cardiac silhouette is mildly enlarged, and appears larger than it did on the prior chest radiographs. No mediastinal or hilar masses. Mild linear atelectasis or scarring at the right lung base. Lungs otherwise clear. No pleural effusion or pneumothorax. Skeletal structures are grossly intact. IMPRESSION: 1. No acute cardiopulmonary disease. 2. Mild cardiomegaly. Cardiac silhouette appears mildly increased from the prior chest radiographs. Electronically Signed   By: Amie Portland M.D.   On: 01/05/2018 19:35    Microbiology: No results found for this or any previous visit (from the past 240 hour(s)).   Labs: Basic Metabolic Panel: Recent Labs  Lab 01/05/18 1615 01/06/18 0436  NA 139 140  K 4.8 4.8  CL 104 104  CO2 25 26  GLUCOSE 95 192*  BUN 22* 22*  CREATININE 1.70* 1.70*  CALCIUM 8.6* 8.6*   Liver Function Tests: Recent Labs  Lab 01/05/18 1615  AST 33  ALT 12*  ALKPHOS 139*  BILITOT 1.4*  PROT 7.9  ALBUMIN 3.7   No results for input(s): LIPASE, AMYLASE in the last 168 hours. No results for input(s): AMMONIA in the last 168 hours. CBC: Recent Labs  Lab 01/05/18 1615 01/06/18 0436  WBC 6.5 5.7  NEUTROABS 4.3  --   HGB 13.0 11.7*  HCT 39.7 37.9*  MCV 93.6 96.7  PLT 275 220   Cardiac Enzymes: No results for input(s): CKTOTAL, CKMB, CKMBINDEX, TROPONINI in the last 168 hours. BNP: BNP (last 3 results) No results for input(s): BNP in the last 8760 hours.  ProBNP (last 3 results) No results for input(s): PROBNP in the last 8760 hours.  CBG: No results for input(s): GLUCAP in the last 168 hours.     Signed:  Penny Pia MD.  [facility] 01/10/2018, 4:07 PM

## 2018-01-10 NOTE — Care Management Important Message (Signed)
Important Message  Patient Details  Name: [name] [name] MRN: [id number] Date of Birth: 1932-06-15   Medicare Important Message Given:  Yes    [name] 01/10/2018, 11:00 AMImportant Message  Patient Details  Name: [name] [name] MRN: [id number] Date of Birth: [birth year]   Medicare Important Message Given:  Yes    [name] 01/10/2018, 11:00 AM

## 2018-01-10 NOTE — Progress Notes (Signed)
Discharge instructions reviewed with patient. All questions answered. Patient wheeled to vehicle with belongings by nurse tech. 

## 2018-01-11 NOTE — Progress Notes (Signed)
HH services arranged with [facility] prior to d/c. Confirmed with [facility] they have accepted referral.

## 2018-01-24 ENCOUNTER — Ambulatory Visit: Payer: Self-pay | Admitting: Internal Medicine

## 2018-01-24 ENCOUNTER — Other Ambulatory Visit: Payer: Self-pay | Admitting: Family Medicine

## 2018-01-24 DIAGNOSIS — R1310 Dysphagia, unspecified: Secondary | ICD-10-CM

## 2018-01-27 ENCOUNTER — Encounter (HOSPITAL_COMMUNITY): Payer: Self-pay

## 2018-01-27 ENCOUNTER — Other Ambulatory Visit: Payer: Self-pay

## 2018-01-27 ENCOUNTER — Inpatient Hospital Stay (HOSPITAL_COMMUNITY)
Admission: EM | Admit: 2018-01-27 | Discharge: 2018-01-29 | DRG: 312 | Disposition: A | Discharge status: Home Health | Payer: Medicare Other | Attending: Internal Medicine | Admitting: Internal Medicine

## 2018-01-27 ENCOUNTER — Inpatient Hospital Stay (HOSPITAL_COMMUNITY): Payer: Medicare Other

## 2018-01-27 DIAGNOSIS — L03818 Cellulitis of other sites: Secondary | ICD-10-CM | POA: Diagnosis not present

## 2018-01-27 DIAGNOSIS — I130 Hypertensive heart and chronic kidney disease with heart failure and stage 1 through stage 4 chronic kidney disease, or unspecified chronic kidney disease: Secondary | ICD-10-CM | POA: Diagnosis present

## 2018-01-27 DIAGNOSIS — N400 Enlarged prostate without lower urinary tract symptoms: Secondary | ICD-10-CM | POA: Diagnosis present

## 2018-01-27 DIAGNOSIS — I351 Nonrheumatic aortic (valve) insufficiency: Secondary | ICD-10-CM | POA: Diagnosis present

## 2018-01-27 DIAGNOSIS — I447 Left bundle-branch block, unspecified: Secondary | ICD-10-CM | POA: Diagnosis present

## 2018-01-27 DIAGNOSIS — N183 Chronic kidney disease, stage 3 unspecified: Secondary | ICD-10-CM

## 2018-01-27 DIAGNOSIS — Z8261 Family history of arthritis: Secondary | ICD-10-CM

## 2018-01-27 DIAGNOSIS — L03115 Cellulitis of right lower limb: Secondary | ICD-10-CM | POA: Diagnosis present

## 2018-01-27 DIAGNOSIS — Z87891 Personal history of nicotine dependence: Secondary | ICD-10-CM

## 2018-01-27 DIAGNOSIS — I2510 Atherosclerotic heart disease of native coronary artery without angina pectoris: Secondary | ICD-10-CM | POA: Diagnosis present

## 2018-01-27 DIAGNOSIS — F39 Unspecified mood [affective] disorder: Secondary | ICD-10-CM | POA: Diagnosis present

## 2018-01-27 DIAGNOSIS — Z952 Presence of prosthetic heart valve: Secondary | ICD-10-CM | POA: Diagnosis not present

## 2018-01-27 DIAGNOSIS — Z823 Family history of stroke: Secondary | ICD-10-CM

## 2018-01-27 DIAGNOSIS — R0609 Other forms of dyspnea: Secondary | ICD-10-CM | POA: Diagnosis not present

## 2018-01-27 DIAGNOSIS — N401 Enlarged prostate with lower urinary tract symptoms: Secondary | ICD-10-CM | POA: Diagnosis present

## 2018-01-27 DIAGNOSIS — I361 Nonrheumatic tricuspid (valve) insufficiency: Secondary | ICD-10-CM | POA: Diagnosis not present

## 2018-01-27 DIAGNOSIS — R3911 Hesitancy of micturition: Secondary | ICD-10-CM | POA: Diagnosis not present

## 2018-01-27 DIAGNOSIS — J449 Chronic obstructive pulmonary disease, unspecified: Secondary | ICD-10-CM | POA: Diagnosis not present

## 2018-01-27 DIAGNOSIS — G4733 Obstructive sleep apnea (adult) (pediatric): Secondary | ICD-10-CM | POA: Diagnosis present

## 2018-01-27 DIAGNOSIS — Z951 Presence of aortocoronary bypass graft: Secondary | ICD-10-CM | POA: Diagnosis not present

## 2018-01-27 DIAGNOSIS — I951 Orthostatic hypotension: Secondary | ICD-10-CM | POA: Diagnosis present

## 2018-01-27 DIAGNOSIS — I5032 Chronic diastolic (congestive) heart failure: Secondary | ICD-10-CM | POA: Diagnosis present

## 2018-01-27 DIAGNOSIS — J441 Chronic obstructive pulmonary disease with (acute) exacerbation: Secondary | ICD-10-CM | POA: Diagnosis present

## 2018-01-27 DIAGNOSIS — I480 Paroxysmal atrial fibrillation: Secondary | ICD-10-CM | POA: Diagnosis present

## 2018-01-27 DIAGNOSIS — Z6841 Body Mass Index (BMI) 40.0 and over, adult: Secondary | ICD-10-CM | POA: Diagnosis not present

## 2018-01-27 DIAGNOSIS — K5900 Constipation, unspecified: Secondary | ICD-10-CM | POA: Diagnosis present

## 2018-01-27 DIAGNOSIS — E785 Hyperlipidemia, unspecified: Secondary | ICD-10-CM | POA: Diagnosis present

## 2018-01-27 DIAGNOSIS — E039 Hypothyroidism, unspecified: Secondary | ICD-10-CM | POA: Diagnosis present

## 2018-01-27 DIAGNOSIS — Z841 Family history of disorders of kidney and ureter: Secondary | ICD-10-CM

## 2018-01-27 DIAGNOSIS — N179 Acute kidney failure, unspecified: Secondary | ICD-10-CM | POA: Diagnosis not present

## 2018-01-27 DIAGNOSIS — I9589 Other hypotension: Secondary | ICD-10-CM

## 2018-01-27 DIAGNOSIS — Z833 Family history of diabetes mellitus: Secondary | ICD-10-CM

## 2018-01-27 DIAGNOSIS — Z86718 Personal history of other venous thrombosis and embolism: Secondary | ICD-10-CM

## 2018-01-27 DIAGNOSIS — R350 Frequency of micturition: Secondary | ICD-10-CM | POA: Diagnosis present

## 2018-01-27 DIAGNOSIS — R296 Repeated falls: Secondary | ICD-10-CM | POA: Diagnosis present

## 2018-01-27 DIAGNOSIS — I10 Essential (primary) hypertension: Secondary | ICD-10-CM

## 2018-01-27 DIAGNOSIS — Z7901 Long term (current) use of anticoagulants: Secondary | ICD-10-CM

## 2018-01-27 DIAGNOSIS — L0390 Cellulitis, unspecified: Secondary | ICD-10-CM | POA: Diagnosis present

## 2018-01-27 DIAGNOSIS — K219 Gastro-esophageal reflux disease without esophagitis: Secondary | ICD-10-CM | POA: Diagnosis present

## 2018-01-27 DIAGNOSIS — E034 Atrophy of thyroid (acquired): Secondary | ICD-10-CM | POA: Diagnosis not present

## 2018-01-27 DIAGNOSIS — L03116 Cellulitis of left lower limb: Secondary | ICD-10-CM | POA: Diagnosis present

## 2018-01-27 DIAGNOSIS — I959 Hypotension, unspecified: Secondary | ICD-10-CM | POA: Diagnosis present

## 2018-01-27 DIAGNOSIS — F329 Major depressive disorder, single episode, unspecified: Secondary | ICD-10-CM | POA: Diagnosis present

## 2018-01-27 DIAGNOSIS — R0600 Dyspnea, unspecified: Secondary | ICD-10-CM

## 2018-01-27 DIAGNOSIS — E7800 Pure hypercholesterolemia, unspecified: Secondary | ICD-10-CM | POA: Diagnosis present

## 2018-01-27 DIAGNOSIS — N189 Chronic kidney disease, unspecified: Secondary | ICD-10-CM | POA: Diagnosis present

## 2018-01-27 DIAGNOSIS — Z8249 Family history of ischemic heart disease and other diseases of the circulatory system: Secondary | ICD-10-CM

## 2018-01-27 DIAGNOSIS — Z803 Family history of malignant neoplasm of breast: Secondary | ICD-10-CM

## 2018-01-27 DIAGNOSIS — F419 Anxiety disorder, unspecified: Secondary | ICD-10-CM | POA: Diagnosis present

## 2018-01-27 DIAGNOSIS — R609 Edema, unspecified: Secondary | ICD-10-CM | POA: Diagnosis not present

## 2018-01-27 LAB — BASIC METABOLIC PANEL
Anion gap: 8 (ref 5–15)
BUN: 26 mg/dL — ABNORMAL HIGH (ref 6–20)
CALCIUM: 8.6 mg/dL — AB (ref 8.9–10.3)
CO2: 28 mmol/L (ref 22–32)
CREATININE: 1.81 mg/dL — AB (ref 0.61–1.24)
Chloride: 105 mmol/L (ref 101–111)
GFR calc non Af Amer: 33 mL/min — ABNORMAL LOW (ref >60)
GFR, EST AFRICAN AMERICAN: 38 mL/min — AB (ref >60)
GLUCOSE: 94 mg/dL (ref 65–99)
Potassium: 4.4 mmol/L (ref 3.5–5.1)
Sodium: 141 mmol/L (ref 135–145)

## 2018-01-27 LAB — URINALYSIS, ROUTINE W REFLEX MICROSCOPIC
BILIRUBIN URINE: NEGATIVE
Glucose, UA: NEGATIVE mg/dL
HGB URINE DIPSTICK: NEGATIVE
Ketones, ur: NEGATIVE mg/dL
Leukocytes, UA: NEGATIVE
Nitrite: NEGATIVE
PROTEIN: NEGATIVE mg/dL
Specific Gravity, Urine: 1.023 (ref 1.005–1.030)
pH: 5 (ref 5.0–8.0)

## 2018-01-27 LAB — CBC
HCT: 37.7 % — ABNORMAL LOW (ref 39.0–52.0)
Hemoglobin: 11.9 g/dL — ABNORMAL LOW (ref 13.0–17.0)
MCH: 30.4 pg (ref 26.0–34.0)
MCHC: 31.6 g/dL (ref 30.0–36.0)
MCV: 96.4 fL (ref 78.0–100.0)
PLATELETS: 194 10*3/uL (ref 150–400)
RBC: 3.91 MIL/uL — ABNORMAL LOW (ref 4.22–5.81)
RDW: 14.7 % (ref 11.5–15.5)
WBC: 6.3 10*3/uL (ref 4.0–10.5)

## 2018-01-27 LAB — TROPONIN I

## 2018-01-27 MED ORDER — SODIUM CHLORIDE 0.9 % IV BOLUS
500.0000 mL | Freq: Once | INTRAVENOUS | Status: AC
  Administered 2018-01-27: 500 mL via INTRAVENOUS

## 2018-01-27 MED ORDER — SODIUM CHLORIDE 0.9 % IV BOLUS
250.0000 mL | Freq: Once | INTRAVENOUS | Status: AC
  Administered 2018-01-27: 250 mL via INTRAVENOUS

## 2018-01-27 MED ORDER — SODIUM CHLORIDE 0.9 % IV SOLN
INTRAVENOUS | Status: DC
  Administered 2018-01-27: 23:00:00 via INTRAVENOUS

## 2018-01-27 NOTE — ED Triage Notes (Signed)
Patient reports that he a visit from the Home Health Nurse today and was told that he had a BP-70/60. BP-113/87 in triage. Patient reports that he has been having intermittent dizziness x months, but worse today. Patient states he has fallen 23 times in the past year.

## 2018-01-27 NOTE — H&P (Signed)
[name] [name] GUR:[id number] DOB: [birth year] DOA: 01/27/2018     PCP: Aura Dials, MD   Outpatient Specialists:  CARDS:  Dr. Acie Fredrickson   Pulmonary Dr. Lake Bells  . Patient arrived to ER on 01/27/18 at [facility]  Patient coming from:  home Lives  With family ( wife with dementia patient is the main care giver)    Chief Complaint:  Chief Complaint  Patient presents with  . Hypotension    HPI: [name] [name] is a 84 y.o. male with medical history significant of aortic insufficiency status post AVR CABG, hyperlipidemia, HTN, OSA, AAA, hypothyroidism, COPD, A. fib static hypertension, CKD, CAD, DVT frequent falls, GERD    Presented with feeling lightheaded his home health nurse visited him today and check his blood pressure was 70/60 he has been feeling lightheaded for months but seems to be somewhat worse today.  He has frequent falls. Blood pressure is worse when he tries to stand up with his blood pressure dropping down into 70s no chest pain or shortness of breath no dysuria but reports frequent urination. Reports constipation, has chronic shortness of breath. 3 weeks of right leg swelling. Same leg as prior DVT. Home health have been taking care of the wounds.  Recently admitted for cellulitis patient which has improved after steroids and bronchodilators discharged on 14 May on doxycycline and Keflex  Reports hx of recurrent falls with episodes of syncope states he has been told this was due to remote head injury.   Have had increased wheezing worse since he got home. Cough productive of phlegm. No fever.   reports does not use CPAP bc he takes it off at night.   Regarding pertinent Chronic problems: History of atrial fibrillation on Eliquis and Coreg. History of aortic insufficiency status post bioprosthetic valve, History of COPD followed by pulmonology Has known CAD disease of RCA was was not able to be bypassed secondary to being inability to harvest SVG Chronic CKD baseline  creatinine 1.7 While in ER: Given IV fluids and check orthostatics but despite fluid resuscitation continues to be orthostatic  Following Medications were ordered in ER: Medications  sodium chloride 0.9 % bolus 250 mL (has no administration in time range)  0.9 %  sodium chloride infusion (has no administration in time range)  sodium chloride 0.9 % bolus 500 mL (0 mLs Intravenous Stopped 01/27/18 2116)    Significant initial  Findings: Abnormal Labs Reviewed  BASIC METABOLIC PANEL - Abnormal; Notable for the following components:      Result Value   BUN 26 (*)    Creatinine, Ser 1.81 (*)    Calcium 8.6 (*)    GFR calc non Af Amer 33 (*)    GFR calc Af Amer 38 (*)    All other components within normal limits  CBC - Abnormal; Notable for the following components:   RBC 3.91 (*)    Hemoglobin 11.9 (*)    HCT 37.7 (*)    All other components within normal limits     Na 141 K 4.4  Cr    stable,   Lab Results  Component Value Date   CREATININE 1.81 (H) 01/27/2018   CREATININE 1.70 (H) 01/06/2018   CREATININE 1.70 (H) 01/05/2018      WBC  6.3  HG/HCT  slightly down    Component Value Date/Time   HGB 11.9 (L) 01/27/2018 1931   HGB 12.7 04/08/2015 1428   HCT 37.7 (L) 01/27/2018 1931   HCT 39.0 04/08/2015 1428  Trop <0.03     Lactic Acid, Venous    Component Value Date/Time   LATICACIDVEN 1.33 01/05/2018 1906      UA  no evidence of UTI       ECG:  Personally reviewed by me showing: HR : 78 Rhythm:  LBBB   , no evidence of ischemic changes QTC 442       ED Triage Vitals  Enc Vitals Group     BP 01/27/18 1832 113/87     Pulse Rate 01/27/18 1832 78     Resp 01/27/18 1832 20     Temp 01/27/18 1832 98.1 F (36.7 C)     Temp Source 01/27/18 1832 Oral     SpO2 01/27/18 1832 94 %     Weight 01/27/18 1832 (!) 315 lb (142.9 kg)     Height 01/27/18 1832 6' (1.829 m)     Head Circumference --      Peak Flow --      Pain Score 01/27/18 1841 6      Pain Loc --      Pain Edu? --      Excl. in [place]? --   TMAX(24)@       Latest  Blood pressure (!) 132/92, pulse 72, temperature 98.1 F (36.7 C), temperature source Oral, resp. rate (!) 24, height 6' (1.829 m), weight (!) 142.9 kg (315 lb), SpO2 91 %.    Hospitalist was called for admission for orthostatic hypotension symptomatic   Review of Systems:    Pertinent positives include:  fatigue, frequent falls and lightheadedness lower extremity swelling dizziness,  anasarca, abdominal pain,change in bowel habits, shortness of breath at rest dyspnea on exertion, excess mucus, productive cough wheezing. lesions.  urgency or frequency.  Constitutional:  No weight loss, night sweats, Fevers, chills, weight loss  HEENT:  No headaches, Difficulty swallowing,Tooth/dental problems,Sore throat,  No sneezing, itching, ear ache, nasal congestion, post nasal drip,  Cardio-vascular:  No chest pain, Orthopnea, PND, palpitations.no Bilateral  GI:  No heartburn, indigestion, nausea, vomiting, diarrhea,  loss of appetite, melena, blood in stool, hematemesis Resp:   No non-productive cough, No coughing up of blood.No change in color of mucus.  Skin:  no rash  No jaundice GU:  no dysuria, change in color of urine,  No straining to urinate.  No flank pain.  Musculoskeletal:  No joint pain or no joint swelling. No decreased range of motion. No back pain.  Psych:  No change in mood or affect. No depression or anxiety. No memory loss.  Neuro: no localizing neurological complaints, no tingling, no weakness, no double vision, no gait abnormality, no slurred speech, no confusion  As per HPI otherwise 10 point review of systems negative.   Past Medical History:   Past Medical History:  Diagnosis Date  . Anxiety   . Aortic valve insufficiency   . Arthritis    "wee bit; knees, elbows" (10/15/2014)  . Atrial fibrillation ([place])   . Cardiomyopathy ([place])   . Chronic kidney disease    "down to ~ 1/2 of  their regular use; I see kidney dr. in [place]" (10/15/2014)  . Complication of anesthesia    unsure of complications but pt. states there were complications  . COPD (chronic obstructive pulmonary disease) ([place])   . Coronary artery disease   . Decreased testosterone level   . Depression   . Difficult intubation    difficult intubation 12/03/09 and 03/27/10 ([facility]); glidescope used 03/27/10  . DVT (deep venous  thrombosis) ([place]) ~ 2013   "BLE"  . Dysrhythmia    A. Fib  . Emphysema of lung ([place])   . Enlarged prostate   . Falls frequently    > 20 times in the last year/notes 10/15/2014  . GERD (gastroesophageal reflux disease)   . Heart murmur   . History of blood transfusion 1960   "lots; related to an accident"  . History of echocardiogram    post AVR >> Echo 7/16:  Mild LVH, EF 20-25%, AVR ok (peak 15 mmHg, mean 9 mmHg), MAC, mild MR, severe LAE, mild reduced RVSF, mild RAE, PASP 35 mmHg  . Hypercholesteremia   . Hypertension   . Hypothyroidism   . OSA (obstructive sleep apnea)    "suppose to wear mask; I throw it off in my sleep" (10/15/2014)  . Plantar fasciitis   . Right fibular fracture 10/15/2014  . Seasonal allergies   . Shortness of breath dyspnea   . Syncope 10/15/2014  . Syncope and collapse "several times"  . Urine frequency   . Urine incontinence   . Varicose veins       Past Surgical History:  Procedure Laterality Date  . ABDOMINAL AORTIC ANEURYSM REPAIR  01/2006; 03/10/2006   Archie Endo 01/12/2011  . AORTIC VALVE REPLACEMENT N/A 02/11/2015   Procedure: AORTIC VALVE REPLACEMENT (AVR);  Surgeon: Grace Isaac, MD;  Location: [facility];  Service: Open Heart Surgery;  Laterality: N/A;  . BRAIN SURGERY  1960 X 4   "S/P got my skull busted"; pt. states removed internal carotid artery  . CARDIAC CATHETERIZATION  1990's X 1; 09/2014   no stents  . CLIPPING OF ATRIAL APPENDAGE N/A 02/11/2015   Procedure: CLIPPING OF ATRIAL APPENDAGE;  Surgeon: Grace Isaac, MD;  Location: [facility];  Service: Open Heart Surgery;  Laterality: N/A;  . ERCP  11/2009   Archie Endo 12/05/2009  . gall stone    . HERNIA REPAIR    . LAPAROSCOPIC CHOLECYSTECTOMY  08/2009   w/IOC/notes 09/18/2009  . LAPAROSCOPIC INCISIONAL / UMBILICAL / VENTRAL HERNIA REPAIR  02/2010   VHR w/mesh/notes 03/28/2010  . NOSE SURGERY    . TEE WITHOUT CARDIOVERSION N/A 02/11/2015   Procedure: TRANSESOPHAGEAL ECHOCARDIOGRAM (TEE);  Surgeon: Grace Isaac, MD;  Location: [facility];  Service: Open Heart Surgery;  Laterality: N/A;  . TONSILLECTOMY      Social History:  Ambulatory  Independently or  cane,      reports that he quit smoking about 43 years ago. His smoking use included cigarettes. He has a 44.00 pack-year smoking history. He has never used smokeless tobacco. He reports that he drinks alcohol. He reports that he does not use drugs.     Family History:   Family History  Problem Relation Age of Onset  . Rheum arthritis Mother   . Diabetes Mother   . Heart disease Father   . Rheum arthritis Father   . Kidney disease Father   . Kidney failure Brother   . Breast cancer Sister   . Other Sister        Murdered  . Depression Sister   . Cancer Sister   . Stroke Sister   . Colon cancer Neg Hx   . Colon polyps Neg Hx   . Liver disease Neg Hx     Allergies: No Known Allergies   Prior to Admission medications   Medication Sig Start Date End Date Taking? Authorizing Provider  amLODipine (NORVASC) 2.5 MG tablet Take 2 tablets (  5 mg total) by mouth daily. 01/04/17  Yes Nahser, [name], MD  atorvastatin (LIPITOR) 10 MG tablet Take 1 tablet (10 mg total) by mouth daily. 02/28/15  Yes Angiulli, Lavon Paganini, PA-C  buPROPion (WELLBUTRIN XL) 300 MG 24 hr tablet Take 1 tablet (300 mg total) by mouth daily. 02/28/15  Yes Angiulli, Lavon Paganini, PA-C  busPIRone (BUSPAR) 15 MG tablet Take 1 tablet (15 mg total) by mouth 2 (two) times daily. 02/28/15  Yes Angiulli, Lavon Paganini, PA-C  busPIRone (BUSPAR) 7.5 MG tablet Take 15 mg by  mouth 2 (two) times daily. 01/14/18  Yes [provider]  carvedilol (COREG) 25 MG tablet TAKE 1 TABLET BY MOUTH TWICE A DAY 09/20/17  Yes Nahser, [name], MD  clonazePAM (KLONOPIN) 0.5 MG tablet Take 1 tablet (0.5 mg total) by mouth at bedtime. 02/28/15  Yes Angiulli, Lavon Paganini, PA-C  dutasteride (AVODART) 0.5 MG capsule Take 1 capsule (0.5 mg total) by mouth daily. 07/16/15  Yes Meredith Staggers, MD  ELIQUIS 2.5 MG TABS tablet TAKE 1 TABLET (2.5 MG TOTAL) BY MOUTH 2 (TWO) TIMES DAILY. 12/19/17  Yes Nahser, [name], MD  escitalopram (LEXAPRO) 10 MG tablet Take 10 mg by mouth daily. 01/21/18  Yes [provider]  escitalopram (LEXAPRO) 20 MG tablet Take 1 tablet (20 mg total) by mouth daily. 02/28/15  Yes Angiulli, Lavon Paganini, PA-C  furosemide (LASIX) 20 MG tablet Take 20 mg by mouth daily. 01/10/18  Yes [provider]  lamoTRIgine (LAMICTAL) 100 MG tablet Take 1 tablet (100 mg total) by mouth 2 (two) times daily. Breakfast and dinner Patient taking differently: Take 100-200 mg by mouth. Take 222m by mouth in the morning and take 1066mby mouth at bedtime 02/28/15  Yes Angiulli, DaLavon PaganiniPA-C  levothyroxine (SYNTHROID, LEVOTHROID) 175 MCG tablet Take 175 mcg by mouth daily before breakfast.    Yes [provider]  mirtazapine (REMERON) 7.5 MG tablet Take 15 mg by mouth at bedtime.  10/13/15  Yes [provider]  potassium chloride (K-DUR) 10 MEQ tablet Take 10 mEq by mouth daily.   Yes [provider]  Tiotropium Bromide-Olodaterol (STIOLTO RESPIMAT) 2.5-2.5 MCG/ACT AERS Inhale 2 puffs into the lungs daily. 02/23/17  Yes McJuanito DoomMD  folic acid (FOLVITE) 1 MG tablet Take 1 tablet (1 mg total) by mouth daily. Patient not taking: Reported on 01/05/2018 08/13/15   SwMeredith StaggersMD  furosemide (LASIX) 40 MG tablet Take 1 tablet (40 mg total) by mouth daily. 10/26/16 08/17/17  Bhagat, Bhavinkumar, PA  KLOR-CON M10 10 MEQ tablet TAKE 1 TABLET (10  MEQ TOTAL) BY MOUTH DAILY. Patient not taking: Reported on 01/05/2018 06/22/17   Nahser, PhWonda ChengMD  omeprazole (PRILOSEC) 20 MG capsule Take 1 capsule (20 mg total) by mouth daily. Patient not taking: Reported on 01/05/2018 12/08/16   McJuanito DoomMD  potassium chloride (K-DUR) 10 MEQ tablet Take 1 tablet (10 mEq total) by mouth daily. Patient not taking: Reported on 08/17/2017 10/26/16   BhLeanor KailPA   Physical Exam: Blood pressure (!) 132/92, pulse 72, temperature 98.1 F (36.7 C), temperature source Oral, resp. rate (!) 24, height 6' (1.829 m), weight (!) 142.9 kg (315 lb), SpO2 91 %. 1. General:  in No Acute distress  Chronically ill   -appearing 2. Psychological: Alert and Oriented 3. Head/ENT:   Dry Mucous Membranes  Head Non traumatic, neck supple                           Poor Dentition 4. SKIN:   decreased Skin turgor,  Skin clean right lower extremity wrapped dressing in place small ulcerations noted appear to be well-healing  5. Heart: Regular rate and rhythm systolic Murmur, no Rub or gallop 6. Lungs:  Bilateral wheezes some crackles   7. Abdomen: Soft, non-tender,   distended   obese  bowel sounds present 8. Lower extremities: no clubbing, cyanosis, or edema R>>L 9. Neurologically Grossly intact, moving all 4 extremities equally  10. MSK: Normal range of motion   LABS:     Recent Labs  Lab 01/27/18 1931  WBC 6.3  HGB 11.9*  HCT 37.7*  MCV 96.4  PLT 469   Basic Metabolic Panel: Recent Labs  Lab 01/27/18 1931  NA 141  K 4.4  CL 105  CO2 28  GLUCOSE 94  BUN 26*  CREATININE 1.81*  CALCIUM 8.6*      No results for input(s): AST, ALT, ALKPHOS, BILITOT, PROT, ALBUMIN in the last 168 hours. No results for input(s): LIPASE, AMYLASE in the last 168 hours. No results for input(s): AMMONIA in the last 168 hours.    HbA1C: No results for input(s): HGBA1C in the last 72 hours. CBG: No results for input(s): GLUCAP in  the last 168 hours.    Urine analysis:    Component Value Date/Time   COLORURINE YELLOW 01/05/2018 2205   APPEARANCEUR CLEAR 01/05/2018 2205   LABSPEC 1.014 01/05/2018 2205   PHURINE 6.0 01/05/2018 2205   GLUCOSEU NEGATIVE 01/05/2018 2205   HGBUR NEGATIVE 01/05/2018 2205   BILIRUBINUR NEGATIVE 01/05/2018 2205   KETONESUR NEGATIVE 01/05/2018 2205   PROTEINUR NEGATIVE 01/05/2018 2205   UROBILINOGEN 2.0 (H) 02/21/2015 1241   NITRITE NEGATIVE 01/05/2018 2205   LEUKOCYTESUR NEGATIVE 01/05/2018 2205      Cultures:    Component Value Date/Time   SDES URINE, CLEAN CATCH 02/21/2015 1241   SPECREQUEST NONE 02/21/2015 1241   CULT >=100,000 COLONIES/mL PSEUDOMONAS AERUGINOSA 02/21/2015 1241   REPTSTATUS 02/23/2015 FINAL 02/21/2015 1241     Radiological Exams on Admission: No results found.  Chart has been reviewed    Assessment/Plan  84 y.o. male with medical history significant of aortic insufficiency status post AVR CABG, hyperlipidemia, HTN, OSA, AAA, hypothyroidism, COPD, A. fib static hypertension, CKD, CAD, DVT frequent falls, GERD    Admitted for  orthostatic hypotension symptomatic, anasarca, chronic wounds with leg swelling, COPD exacerbation  Present on Admission: . Orthostatic hypotension -gently rehydrated with consult cardiology regarding long-term management.  Obtain echogram patient with complex cardiac history . Paroxysmal A-fib (HCC) -           - CHA2DS2 vas score 5 : continue current anticoagulation with  Eliquis,            -  Rate control:  Currently controlled with Coreg will hold for tonight given hypotension   Leg swelling severe right lower extremity edema with prior history of DVT although patient is on anticoagulation will check Dopplers given severity of symptoms Right leg erythema and multiple wounds will order wound care consult restart doxycycline as patient has improved in the past . Symptomatic hypotension hold home medications for tonight give  gentle fluids given peripheral edema we will check albumin . COPD with acute exacerbation ([place])-  -  - Will initiate: Steroid taper  -  Antibiotics  Doxycycline, - Albuterol  PRN, - scheduled duoneb,  -  Breo or Dulera at discharge   -  Mucinex.  Titrate O2 to saturation >90%. Follow patients respiratory status.   Currently mentating well no evidence of symptomatic hypercarbia May benefit from pulmonology consult if not improvement given recurrent admissions . CAD (coronary artery disease), native coronary artery -  - chronic, continue   beta blocker   . BPH (benign prostatic hyperplasia) stable given hypertension we will hold home medications . AR (aortic regurgitation) status post bioprosthetic valve order echogram . Chronic diastolic heart failure ([place]) difficult to assess fluid status given hypo-tension and orthostasis but on now had peripheral edema for now hold Lasix given hypotension . CKD (chronic kidney disease) avoid nephrotoxic medications currently creatinine close to baseline . COPD (chronic obstructive pulmonary disease) with chronic bronchitis ([place]) . Essential hypertension hold home medications given hypotension . Hyperlipidemia, unspecified -stable . Hypothyroidism  -checks TSH and continue Synthroid . Obstructive sleep apnea patient is no longer using CPAP    Other plan as per orders.  DVT prophylaxis:  Eliquis     Code Status:  FULL CODE  as per patient  I had personally discussed CODE STATUS with patient   Family Communication:   Family not at  Bedside    Disposition Plan:    likely will need placement for rehabilitation                                                 Would benefit from PT/OT eval prior to DC  ordered                       Nutrition   consulted                          Consults called: email cardiology   Admission status:    obs   Level of care    tele           Toy Baker 01/27/2018, 11:41 PM    [facility]  Pager  [phone]   after 2 AM please page floor coverage PA If 7AM-7PM, please contact the day team taking care of the patient  [email or web address]  Password [email or web address]

## 2018-01-27 NOTE — ED Provider Notes (Signed)
[facility] DEPT Provider Note   CSN: [id number] Arrival date & time: 01/27/18  1827     History   Chief Complaint Chief Complaint  Patient presents with  . Hypotension    HPI [name] [name] is a 84 y.o. male.  84 year old male who presents with low blood pressure at home.  He reportedly had blood pressure of 70/60.  This became evident when he stood up.  When he was lying flat it was in the 90s.  He denies any recent history of volume loss.  No changes to his medications.  Has been having problems with dizziness times several months but has been worse today.  Has had multiple falls but denies any current fall.  No associated chest pain or shortness of breath.  No urinary symptoms.  No treatment used prior to arrival     Past Medical History:  Diagnosis Date  . Anxiety   . Aortic valve insufficiency   . Arthritis    "wee bit; knees, elbows" (10/15/2014)  . Atrial fibrillation ([place])   . Cardiomyopathy ([place])   . Chronic kidney disease    "down to ~ 1/2 of their regular use; I see kidney dr. in [place]" (10/15/2014)  . Complication of anesthesia    unsure of complications but pt. states there were complications  . COPD (chronic obstructive pulmonary disease) ([place])   . Coronary artery disease   . Decreased testosterone level   . Depression   . Difficult intubation    difficult intubation 12/03/09 and 03/27/10 ([facility]); glidescope used 03/27/10  . DVT (deep venous thrombosis) ([place]) ~ 2013   "BLE"  . Dysrhythmia    A. Fib  . Emphysema of lung ([place])   . Enlarged prostate   . Falls frequently    > 20 times in the last year/notes 10/15/2014  . GERD (gastroesophageal reflux disease)   . Heart murmur   . History of blood transfusion 1960   "lots; related to an accident"  . History of echocardiogram    post AVR >> Echo 7/16:  Mild LVH, EF 20-25%, AVR ok (peak 15 mmHg, mean 9 mmHg), MAC, mild MR, severe LAE, mild reduced RVSF, mild RAE, PASP 35 mmHg  .  Hypercholesteremia   . Hypertension   . Hypothyroidism   . OSA (obstructive sleep apnea)    "suppose to wear mask; I throw it off in my sleep" (10/15/2014)  . Plantar fasciitis   . Right fibular fracture 10/15/2014  . Seasonal allergies   . Shortness of breath dyspnea   . Syncope 10/15/2014  . Syncope and collapse "several times"  . Urine frequency   . Urine incontinence   . Varicose veins     Patient Active Problem List   Diagnosis Date Noted  . Cellulitis 01/05/2018  . Pre-diabetes 01/07/2017  . COPD (chronic obstructive pulmonary disease) with chronic bronchitis ([place]) 12/08/2016  . Elevated hemidiaphragm 12/08/2016  . Enlarged thoracic aorta ([place]) 12/03/2016  . Chronic diastolic heart failure ([place]) 12/03/2016  . Pulmonary nodule 12/03/2016  . Orthostatic hypotension 11/08/2015  . CKD (chronic kidney disease) 03/18/2015  . S/P AVR 02/11/2015  . Ataxia 12/05/2014  . Abnormality of gait 12/05/2014  . Neck pain, chronic 12/05/2014  . Abnormal carotid duplex scan 10/28/2014  . Obstructive sleep apnea 10/28/2014  . Mood disorder with psychosis ([place]) 10/28/2014  . Episodes of formed visual hallucinations 10/16/2014  . CAD (coronary artery disease), native coronary artery 09/29/2014  . Falls frequently 09/29/2014  . Essential  hypertension   . Hyperlipidemia, unspecified   . AR (aortic regurgitation)   . Depression   . Paroxysmal A-fib ([place]) 06/20/2014  . Hypothyroidism 06/20/2014  . Exertional dyspnea 04/29/2014  . Obesity (BMI 30-39.9) 08/08/2012  . Anxiety 08/03/2012  . Hydrocele 07/14/2012  . Abdominal aortic aneurysm ([place]) 09/08/2011  . BPH (benign prostatic hyperplasia) 09/08/2011  . GERD (gastroesophageal reflux disease) 09/08/2011    Past Surgical History:  Procedure Laterality Date  . ABDOMINAL AORTIC ANEURYSM REPAIR  01/2006; 03/10/2006   Archie Endo 01/12/2011  . AORTIC VALVE REPLACEMENT N/A 02/11/2015   Procedure: AORTIC VALVE REPLACEMENT (AVR);  Surgeon: Grace Isaac, MD;  Location: [facility];  Service: Open Heart Surgery;  Laterality: N/A;  . BRAIN SURGERY  1960 X 4   "S/P got my skull busted"; pt. states removed internal carotid artery  . CARDIAC CATHETERIZATION  1990's X 1; 09/2014   no stents  . CLIPPING OF ATRIAL APPENDAGE N/A 02/11/2015   Procedure: CLIPPING OF ATRIAL APPENDAGE;  Surgeon: Grace Isaac, MD;  Location: [facility];  Service: Open Heart Surgery;  Laterality: N/A;  . ERCP  11/2009   Archie Endo 12/05/2009  . gall stone    . HERNIA REPAIR    . LAPAROSCOPIC CHOLECYSTECTOMY  08/2009   w/IOC/notes 09/18/2009  . LAPAROSCOPIC INCISIONAL / UMBILICAL / VENTRAL HERNIA REPAIR  02/2010   VHR w/mesh/notes 03/28/2010  . NOSE SURGERY    . TEE WITHOUT CARDIOVERSION N/A 02/11/2015   Procedure: TRANSESOPHAGEAL ECHOCARDIOGRAM (TEE);  Surgeon: Grace Isaac, MD;  Location: [facility];  Service: Open Heart Surgery;  Laterality: N/A;  . TONSILLECTOMY          Home Medications    Prior to Admission medications   Medication Sig Start Date End Date Taking? Authorizing Provider  amLODipine (NORVASC) 2.5 MG tablet Take 2 tablets (5 mg total) by mouth daily. 01/04/17   Nahser, [name], MD  atorvastatin (LIPITOR) 10 MG tablet Take 1 tablet (10 mg total) by mouth daily. 02/28/15   Angiulli, Lavon Paganini, PA-C  buPROPion (WELLBUTRIN XL) 300 MG 24 hr tablet Take 1 tablet (300 mg total) by mouth daily. 02/28/15   Angiulli, Lavon Paganini, PA-C  busPIRone (BUSPAR) 15 MG tablet Take 1 tablet (15 mg total) by mouth 2 (two) times daily. 02/28/15   Angiulli, Lavon Paganini, PA-C  carvedilol (COREG) 25 MG tablet TAKE 1 TABLET BY MOUTH TWICE A DAY 09/20/17   Nahser, [name], MD  clonazePAM (KLONOPIN) 0.5 MG tablet Take 1 tablet (0.5 mg total) by mouth at bedtime. 02/28/15   Angiulli, Lavon Paganini, PA-C  dutasteride (AVODART) 0.5 MG capsule Take 1 capsule (0.5 mg total) by mouth daily. 07/16/15   Meredith Staggers, MD  ELIQUIS 2.5 MG TABS tablet TAKE 1 TABLET (2.5 MG TOTAL) BY MOUTH 2 (TWO) TIMES DAILY.  12/19/17   Nahser, [name], MD  escitalopram (LEXAPRO) 20 MG tablet Take 1 tablet (20 mg total) by mouth daily. 02/28/15   Angiulli, Lavon Paganini, PA-C  folic acid (FOLVITE) 1 MG tablet Take 1 tablet (1 mg total) by mouth daily. Patient not taking: Reported on 01/05/2018 08/13/15   Meredith Staggers, MD  furosemide (LASIX) 40 MG tablet Take 1 tablet (40 mg total) by mouth daily. Patient taking differently: Take 20 mg by mouth daily.  10/26/16 08/17/17  Bhagat, Bhavinkumar, PA  KLOR-CON M10 10 MEQ tablet TAKE 1 TABLET (10 MEQ TOTAL) BY MOUTH DAILY. Patient not taking: Reported on 01/05/2018 06/22/17   Nahser,  [name], MD  lamoTRIgine (LAMICTAL) 100 MG tablet Take 1 tablet (100 mg total) by mouth 2 (two) times daily. Breakfast and dinner Patient taking differently: Take 100-200 mg by mouth. Take 252m by mouth in the morning and take 1048mby mouth at bedtime 02/28/15   Angiulli, DaLavon PaganiniPA-C  levothyroxine (SYNTHROID, LEVOTHROID) 175 MCG tablet Take 175 mcg by mouth daily before breakfast.     [provider]  mirtazapine (REMERON) 7.5 MG tablet Take 15 mg by mouth daily.  10/13/15   [provider]  omeprazole (PRILOSEC) 20 MG capsule Take 1 capsule (20 mg total) by mouth daily. Patient not taking: Reported on 01/05/2018 12/08/16   McJuanito DoomMD  potassium chloride (K-DUR) 10 MEQ tablet Take 1 tablet (10 mEq total) by mouth daily. Patient not taking: Reported on 08/17/2017 10/26/16   BhLeanor KailPA  potassium chloride (K-DUR) 10 MEQ tablet Take 10 mEq by mouth daily.    [provider]  Tiotropium Bromide-Olodaterol (STIOLTO RESPIMAT) 2.5-2.5 MCG/ACT AERS Inhale 2 puffs into the lungs daily. 02/23/17   McJuanito DoomMD    Family History Family History  Problem Relation Age of Onset  . Rheum arthritis Mother   . Diabetes Mother   . Heart disease Father   . Rheum arthritis Father   . Kidney disease Father   . Kidney failure Brother   . Breast cancer  Sister   . Other Sister        Murdered  . Depression Sister   . Cancer Sister   . Stroke Sister   . Colon cancer Neg Hx   . Colon polyps Neg Hx   . Liver disease Neg Hx     Social History Social History   Tobacco Use  . Smoking status: Former Smoker    Packs/day: 2.00    Years: 22.00    Pack years: 44.00    Types: Cigarettes    Last attempt to quit: 04/29/1974    Years since quitting: 43.7  . Smokeless tobacco: Never Used  Substance Use Topics  . Alcohol use: Yes    Alcohol/week: 0.0 oz    Comment: 1 beer per year   . Drug use: No     Allergies   Patient has no known allergies.   Review of Systems Review of Systems  All other systems reviewed and are negative.    Physical Exam Updated Vital Signs BP 117/82 (BP Location: Left Arm)   Pulse 68   Temp 98.1 F (36.7 C) (Oral)   Resp 16   Ht 1.829 m (6')   Wt (!) 142.9 kg (315 lb)   SpO2 90%   BMI 42.72 kg/m   Physical Exam  Constitutional: He is oriented to person, place, and time. He appears well-developed and well-nourished.  Non-toxic appearance. No distress.  HENT:  Head: Normocephalic and atraumatic.  Eyes: Pupils are equal, round, and reactive to light. Conjunctivae, EOM and lids are normal.  Neck: Normal range of motion. Neck supple. No tracheal deviation present. No thyroid mass present.  Cardiovascular: Normal rate, regular rhythm and normal heart sounds. Exam reveals no gallop.  No murmur heard. Pulmonary/Chest: Effort normal and breath sounds normal. No stridor. No respiratory distress. He has no decreased breath sounds. He has no wheezes. He has no rhonchi. He has no rales.  Abdominal: Soft. Normal appearance and bowel sounds are normal. He exhibits no distension. There is no tenderness. There is no rebound and no CVA tenderness.  Musculoskeletal: Normal range of motion. He exhibits no edema or tenderness.  Neurological: He is alert and oriented to person, place, and time. He has normal  strength. No cranial nerve deficit or sensory deficit. GCS eye subscore is 4. GCS verbal subscore is 5. GCS motor subscore is 6.  Skin: Skin is warm and dry. No abrasion and no rash noted.  Psychiatric: He has a normal mood and affect. His speech is normal and behavior is normal.  Nursing note and vitals reviewed.    ED Treatments / Results  Labs (all labs ordered are listed, but only abnormal results are displayed) Labs Reviewed  BASIC METABOLIC PANEL - Abnormal; Notable for the following components:      Result Value   BUN 26 (*)    Creatinine, Ser 1.81 (*)    Calcium 8.6 (*)    GFR calc non Af Amer 33 (*)    GFR calc Af Amer 38 (*)    All other components within normal limits  CBC - Abnormal; Notable for the following components:   RBC 3.91 (*)    Hemoglobin 11.9 (*)    HCT 37.7 (*)    All other components within normal limits  URINALYSIS, ROUTINE W REFLEX MICROSCOPIC  TROPONIN I    EKG None  Radiology No results found.  Procedures Procedures (including critical care time)  Medications Ordered in ED Medications - No data to display   Initial Impression / Assessment and Plan / ED Course  I have reviewed the triage vital signs and the nursing notes.  Pertinent labs & imaging results that were available during my care of the patient were reviewed by me and considered in my medical decision making (see chart for details).     Patient's orthostatic vitals checked x2 and after IV fluids still remains orthostatic with heart rate increasing.  Creatinine is slightly more elevated today at 1.81.  He remains symptomatically dizzy with standing.  Will consult hospitalist for admission  Final Clinical Impressions(s) / ED Diagnoses   Final diagnoses:  None    ED Discharge Orders    None       Lacretia Leigh, MD 01/27/18 2230

## 2018-01-28 ENCOUNTER — Inpatient Hospital Stay (HOSPITAL_COMMUNITY): Payer: Medicare Other

## 2018-01-28 DIAGNOSIS — G4733 Obstructive sleep apnea (adult) (pediatric): Secondary | ICD-10-CM

## 2018-01-28 DIAGNOSIS — E034 Atrophy of thyroid (acquired): Secondary | ICD-10-CM

## 2018-01-28 DIAGNOSIS — N183 Chronic kidney disease, stage 3 (moderate): Secondary | ICD-10-CM

## 2018-01-28 DIAGNOSIS — I480 Paroxysmal atrial fibrillation: Secondary | ICD-10-CM

## 2018-01-28 DIAGNOSIS — Z952 Presence of prosthetic heart valve: Secondary | ICD-10-CM

## 2018-01-28 DIAGNOSIS — I361 Nonrheumatic tricuspid (valve) insufficiency: Secondary | ICD-10-CM

## 2018-01-28 DIAGNOSIS — J441 Chronic obstructive pulmonary disease with (acute) exacerbation: Secondary | ICD-10-CM

## 2018-01-28 DIAGNOSIS — I2510 Atherosclerotic heart disease of native coronary artery without angina pectoris: Secondary | ICD-10-CM

## 2018-01-28 DIAGNOSIS — R609 Edema, unspecified: Secondary | ICD-10-CM

## 2018-01-28 DIAGNOSIS — I5032 Chronic diastolic (congestive) heart failure: Secondary | ICD-10-CM

## 2018-01-28 DIAGNOSIS — I9589 Other hypotension: Secondary | ICD-10-CM

## 2018-01-28 DIAGNOSIS — J449 Chronic obstructive pulmonary disease, unspecified: Secondary | ICD-10-CM

## 2018-01-28 LAB — HEPATIC FUNCTION PANEL
ALBUMIN: 3.6 g/dL (ref 3.5–5.0)
ALK PHOS: 106 U/L (ref 38–126)
ALT: 15 U/L — ABNORMAL LOW (ref 17–63)
AST: 19 U/L (ref 15–41)
BILIRUBIN DIRECT: 0.2 mg/dL (ref 0.1–0.5)
Indirect Bilirubin: 0.5 mg/dL (ref 0.3–0.9)
Total Bilirubin: 0.7 mg/dL (ref 0.3–1.2)
Total Protein: 7 g/dL (ref 6.5–8.1)

## 2018-01-28 LAB — TROPONIN I
Troponin I: <0.03 ng/mL (ref <0.03)
Troponin I: <0.03 ng/mL (ref <0.03)

## 2018-01-28 LAB — PREALBUMIN: Prealbumin: 22.4 mg/dL (ref 18–38)

## 2018-01-28 LAB — EXPECTORATED SPUTUM ASSESSMENT W GRAM STAIN, RFLX TO RESP C

## 2018-01-28 LAB — CBC
HEMATOCRIT: 36.1 % — AB (ref 39.0–52.0)
HEMOGLOBIN: 11.2 g/dL — AB (ref 13.0–17.0)
MCH: 30 pg (ref 26.0–34.0)
MCHC: 31 g/dL (ref 30.0–36.0)
MCV: 96.8 fL (ref 78.0–100.0)
Platelets: 181 10*3/uL (ref 150–400)
RBC: 3.73 MIL/uL — ABNORMAL LOW (ref 4.22–5.81)
RDW: 14.9 % (ref 11.5–15.5)
WBC: 5.9 10*3/uL (ref 4.0–10.5)

## 2018-01-28 LAB — MAGNESIUM
Magnesium: 2.2 mg/dL (ref 1.7–2.4)
Magnesium: 2 mg/dL (ref 1.7–2.4)

## 2018-01-28 LAB — HEMOGLOBIN A1C
Hgb A1c MFr Bld: 5.7 % — ABNORMAL HIGH (ref 4.8–5.6)
Mean Plasma Glucose: 116.89 mg/dL

## 2018-01-28 LAB — ECHOCARDIOGRAM COMPLETE
HEIGHTINCHES: 72 in
WEIGHTICAEL: 4987.69 [oz_av]

## 2018-01-28 LAB — COMPREHENSIVE METABOLIC PANEL
ALBUMIN: 3.5 g/dL (ref 3.5–5.0)
ALK PHOS: 99 U/L (ref 38–126)
ALT: 16 U/L — ABNORMAL LOW (ref 17–63)
ANION GAP: 9 (ref 5–15)
AST: 17 U/L (ref 15–41)
BUN: 23 mg/dL — ABNORMAL HIGH (ref 6–20)
CALCIUM: 8.3 mg/dL — AB (ref 8.9–10.3)
CO2: 26 mmol/L (ref 22–32)
Chloride: 106 mmol/L (ref 101–111)
Creatinine, Ser: 1.65 mg/dL — ABNORMAL HIGH (ref 0.61–1.24)
GFR calc Af Amer: 42 mL/min — ABNORMAL LOW (ref >60)
GFR calc non Af Amer: 37 mL/min — ABNORMAL LOW (ref >60)
GLUCOSE: 109 mg/dL — AB (ref 65–99)
POTASSIUM: 3.9 mmol/L (ref 3.5–5.1)
SODIUM: 141 mmol/L (ref 135–145)
Total Bilirubin: 0.7 mg/dL (ref 0.3–1.2)
Total Protein: 6.8 g/dL (ref 6.5–8.1)

## 2018-01-28 LAB — TSH: TSH: 3.852 u[IU]/mL (ref 0.350–4.500)

## 2018-01-28 LAB — PHOSPHORUS
Phosphorus: 3.3 mg/dL (ref 2.5–4.6)
Phosphorus: 2.8 mg/dL (ref 2.5–4.6)

## 2018-01-28 LAB — EXPECTORATED SPUTUM ASSESSMENT W REFEX TO RESP CULTURE: SPECIAL REQUESTS: NORMAL

## 2018-01-28 LAB — BRAIN NATRIURETIC PEPTIDE: B Natriuretic Peptide: 130.2 pg/mL — ABNORMAL HIGH (ref 0.0–100.0)

## 2018-01-28 MED ORDER — ONDANSETRON HCL 4 MG PO TABS: 4.0000 mg | ORAL_TABLET | Freq: Four times a day (QID) | ORAL | Status: DC | PRN

## 2018-01-28 MED ORDER — SODIUM CHLORIDE 0.9 % IV BOLUS
500.0000 mL | Freq: Once | INTRAVENOUS | Status: AC
  Administered 2018-01-28: 500 mL via INTRAVENOUS

## 2018-01-28 MED ORDER — GUAIFENESIN ER 600 MG PO TB12
600.0000 mg | ORAL_TABLET | Freq: Two times a day (BID) | ORAL | Status: DC
  Administered 2018-01-28 – 2018-01-29 (×4): 600 mg via ORAL
  Filled 2018-01-28 (×4): qty 1

## 2018-01-28 MED ORDER — APIXABAN 2.5 MG PO TABS
2.5000 mg | ORAL_TABLET | Freq: Two times a day (BID) | ORAL | Status: DC
  Administered 2018-01-28 – 2018-01-29 (×4): 2.5 mg via ORAL
  Filled 2018-01-28 (×4): qty 1

## 2018-01-28 MED ORDER — SODIUM CHLORIDE 0.9 % IV BOLUS: 500.0000 mL | Freq: Once | INTRAVENOUS | Status: DC

## 2018-01-28 MED ORDER — LAMOTRIGINE 100 MG PO TABS
200.0000 mg | ORAL_TABLET | Freq: Every day | ORAL | Status: DC
  Administered 2018-01-28 – 2018-01-29 (×2): 200 mg via ORAL
  Filled 2018-01-28: qty 2
  Filled 2018-01-28: qty 8

## 2018-01-28 MED ORDER — POLYETHYLENE GLYCOL 3350 17 G PO PACK
17.0000 g | PACK | Freq: Every day | ORAL | Status: DC | PRN
  Administered 2018-01-28: 17 g via ORAL
  Filled 2018-01-28: qty 1

## 2018-01-28 MED ORDER — SODIUM CHLORIDE 0.9 % IV SOLN
INTRAVENOUS | Status: DC
  Administered 2018-01-28: 04:00:00 via INTRAVENOUS

## 2018-01-28 MED ORDER — TOLNAFTATE 1 % EX POWD
Freq: Two times a day (BID) | CUTANEOUS | Status: DC
  Administered 2018-01-28 – 2018-01-29 (×3): via TOPICAL
  Filled 2018-01-28: qty 45

## 2018-01-28 MED ORDER — SENNA 8.6 MG PO TABS
1.0000 | ORAL_TABLET | Freq: Two times a day (BID) | ORAL | Status: DC
  Administered 2018-01-28 – 2018-01-29 (×4): 8.6 mg via ORAL
  Filled 2018-01-28 (×4): qty 1

## 2018-01-28 MED ORDER — METHYLPREDNISOLONE SODIUM SUCC 40 MG IJ SOLR
40.0000 mg | Freq: Two times a day (BID) | INTRAMUSCULAR | Status: AC
  Administered 2018-01-28 (×2): 40 mg via INTRAVENOUS
  Filled 2018-01-28 (×2): qty 1

## 2018-01-28 MED ORDER — BUSPIRONE HCL 5 MG PO TABS
15.0000 mg | ORAL_TABLET | Freq: Two times a day (BID) | ORAL | Status: DC
  Administered 2018-01-28 – 2018-01-29 (×4): 15 mg via ORAL
  Filled 2018-01-28 (×4): qty 1

## 2018-01-28 MED ORDER — ALBUTEROL SULFATE (2.5 MG/3ML) 0.083% IN NEBU
2.5000 mg | INHALATION_SOLUTION | RESPIRATORY_TRACT | Status: DC | PRN
  Administered 2018-01-28: 2.5 mg via RESPIRATORY_TRACT
  Filled 2018-01-28: qty 3

## 2018-01-28 MED ORDER — LEVOTHYROXINE SODIUM 50 MCG PO TABS
175.0000 ug | ORAL_TABLET | Freq: Every day | ORAL | Status: DC
  Administered 2018-01-28 – 2018-01-29 (×2): 175 ug via ORAL
  Filled 2018-01-28 (×2): qty 1

## 2018-01-28 MED ORDER — ESCITALOPRAM OXALATE 10 MG PO TABS
10.0000 mg | ORAL_TABLET | Freq: Every day | ORAL | Status: DC
  Administered 2018-01-28 – 2018-01-29 (×2): 10 mg via ORAL
  Filled 2018-01-28 (×2): qty 1

## 2018-01-28 MED ORDER — BISACODYL 10 MG RE SUPP: 10.0000 mg | Freq: Every day | RECTAL | Status: DC | PRN

## 2018-01-28 MED ORDER — SODIUM CHLORIDE 0.9% FLUSH
3.0000 mL | Freq: Two times a day (BID) | INTRAVENOUS | Status: DC
  Administered 2018-01-28 – 2018-01-29 (×2): 3 mL via INTRAVENOUS

## 2018-01-28 MED ORDER — CARVEDILOL 25 MG PO TABS
25.0000 mg | ORAL_TABLET | Freq: Two times a day (BID) | ORAL | Status: DC
  Administered 2018-01-28 – 2018-01-29 (×3): 25 mg via ORAL
  Filled 2018-01-28 (×3): qty 1

## 2018-01-28 MED ORDER — IPRATROPIUM-ALBUTEROL 0.5-2.5 (3) MG/3ML IN SOLN
3.0000 mL | Freq: Four times a day (QID) | RESPIRATORY_TRACT | Status: DC
  Administered 2018-01-28 – 2018-01-29 (×6): 3 mL via RESPIRATORY_TRACT
  Filled 2018-01-28 (×5): qty 3

## 2018-01-28 MED ORDER — ONDANSETRON HCL 4 MG/2ML IJ SOLN: 4.0000 mg | Freq: Four times a day (QID) | INTRAMUSCULAR | Status: DC | PRN

## 2018-01-28 MED ORDER — HYDROCODONE-ACETAMINOPHEN 5-325 MG PO TABS: 1.0000 | ORAL_TABLET | ORAL | Status: DC | PRN

## 2018-01-28 MED ORDER — DOXYCYCLINE HYCLATE 100 MG PO TABS
100.0000 mg | ORAL_TABLET | Freq: Two times a day (BID) | ORAL | Status: DC
  Administered 2018-01-28 – 2018-01-29 (×3): 100 mg via ORAL
  Filled 2018-01-28 (×3): qty 1

## 2018-01-28 MED ORDER — BUPROPION HCL ER (XL) 300 MG PO TB24
300.0000 mg | ORAL_TABLET | Freq: Every day | ORAL | Status: DC
  Administered 2018-01-28 – 2018-01-29 (×2): 300 mg via ORAL
  Filled 2018-01-28 (×2): qty 1

## 2018-01-28 MED ORDER — ATORVASTATIN CALCIUM 10 MG PO TABS
10.0000 mg | ORAL_TABLET | Freq: Every day | ORAL | Status: DC
  Administered 2018-01-28: 10 mg via ORAL
  Filled 2018-01-28: qty 1

## 2018-01-28 MED ORDER — SODIUM CHLORIDE 0.9 % IV SOLN
100.0000 mg | Freq: Two times a day (BID) | INTRAVENOUS | Status: DC
  Administered 2018-01-28: 100 mg via INTRAVENOUS
  Filled 2018-01-28 (×2): qty 100

## 2018-01-28 MED ORDER — PREDNISONE 20 MG PO TABS
40.0000 mg | ORAL_TABLET | Freq: Every day | ORAL | Status: DC
  Administered 2018-01-29: 40 mg via ORAL
  Filled 2018-01-28: qty 2

## 2018-01-28 MED ORDER — ACETAMINOPHEN 325 MG PO TABS: 650.0000 mg | ORAL_TABLET | Freq: Four times a day (QID) | ORAL | Status: DC | PRN

## 2018-01-28 MED ORDER — BISACODYL 10 MG RE SUPP
10.0000 mg | Freq: Once | RECTAL | Status: AC
  Administered 2018-01-28: 10 mg via RECTAL
  Filled 2018-01-28: qty 1

## 2018-01-28 MED ORDER — MIRTAZAPINE 15 MG PO TABS
15.0000 mg | ORAL_TABLET | Freq: Every day | ORAL | Status: DC
  Administered 2018-01-28: 15 mg via ORAL
  Filled 2018-01-28: qty 1

## 2018-01-28 MED ORDER — LAMOTRIGINE 100 MG PO TABS
100.0000 mg | ORAL_TABLET | Freq: Every day | ORAL | Status: DC
  Administered 2018-01-28 (×2): 100 mg via ORAL
  Filled 2018-01-28 (×2): qty 4

## 2018-01-28 MED ORDER — ACETAMINOPHEN 650 MG RE SUPP: 650.0000 mg | Freq: Four times a day (QID) | RECTAL | Status: DC | PRN

## 2018-01-28 NOTE — Progress Notes (Addendum)
PROGRESS NOTE    [name] [name]   ZOX:[id number]  DOB: 10/13/1931  DOA: 01/27/2018 PCP: [name] Screws, MD   Brief Narrative:  [name] [name] 84 y.o. with medical history significant of aortic insufficiency status post AVR, d CHF on Lasix, CAD/ CABG, hyperlipidemia, HTN, OSA, AAA, hypothyroidism, COPD, A. fib , hypertension, CKD & DVT.    and mood disorder. He lives with his wife who has Alzheimers and he takes care of her.  He went to an Urgent care on [address] about 10 days ago and had a nurse came out the the house and wrapped both his legs. Someone named Simonne Come came on Wednesday to wrap his legs. He then had a speech therapist who checked his BP and told him to come to the ED. The same day, in the afternoon a PT came and said the same. He declined to go. Later the same day he decided to come to the ED.   Subjective: No complaints. States his respiratory status is the same a usual bur RN states he was wheezing quite a bit this AM    Assessment & Plan:   Active Problems:   Orthostatic hypotension Chronic dCHF - Lasix on hold- bolusing with IVF and following orthostatic vitals closely - 2 D ECHO being repeated  LE edema- cellulitis around ulcerated blisters - mostly right LE with blisters and focal erythema - HHRN has been wrapping legs - venous duplex ordered by admitter- wrap in ACE wraps today - I have advised him strictly to keep legs elevated   COPD exacerbation - cont Prednisone, Nebs and Doxy   Paroxysmal A-fib  - cont Noac and Coreg  CKD3 - stable  Tenia pedis - Tinacetin    Hypothyroidism - cont synthroid    OSA - does not wear CPAP  Mood disorder - Buspar, Lamictal  Morbid obesity Body mass index is 42.28 kg/m.  - son feels he has gained 20 lb in the past year   DVT prophylaxis: Eliquis Code Status: Full code Family Communication: son Disposition Plan: home possibly tomorrow  Consultants:   cardiology Procedures:    Antimicrobials:    Anti-infectives (From admission, onward)   Start     Dose/Rate Route Frequency Ordered Stop   01/28/18 1310  doxycycline (VIBRA-TABS) tablet 100 mg     100 mg Oral Every 12 hours 01/28/18 1010     01/28/18 0015  doxycycline (VIBRAMYCIN) 100 mg in sodium chloride 0.9 % 250 mL IVPB  Status:  Discontinued     100 mg 125 mL/hr over 120 Minutes Intravenous Every 12 hours 01/28/18 0005 01/28/18 1010       Objective: Vitals:   01/28/18 0304 01/28/18 0430 01/28/18 0602 01/28/18 0845  BP:      Pulse:      Resp:      Temp:      TempSrc:      SpO2: 91% (!) 89%  90%  Weight:   (!) 141.4 kg (311 lb 11.7 oz)   Height:        Intake/Output Summary (Last 24 hours) at 01/28/2018 1013 Last data filed at 01/28/2018 0600 Gross per 24 hour  Intake 1581.38 ml  Output 150 ml  Net 1431.38 ml   Filed Weights   01/27/18 1832 01/28/18 0602  Weight: (!) 142.9 kg (315 lb) (!) 141.4 kg (311 lb 11.7 oz)    Examination: General exam: Appears comfortable  HEENT: PERRLA, oral mucosa moist, no sclera icterus or thrush Respiratory system: Clear to auscultation.  Respiratory effort normal. Cardiovascular system: S1 & S2 heard, RRR.   Gastrointestinal system: Abdomen soft, morbid obesity- non-tender, nondistended. Normal bowel sound. No organomegaly Central nervous system: Alert and oriented. No focal neurological deficits. Extremities: No cyanosis, clubbing- b/l LE edema with right leg being twice the size as left, blisters and surround erythema noted Skin: see above- fungal infection on feet Psychiatry:  Mood & affect appropriate.     Data Reviewed: I have personally reviewed following labs and imaging studies  CBC: Recent Labs  Lab 01/27/18 1931 01/28/18 0348  WBC 6.3 5.9  HGB 11.9* 11.2*  HCT 37.7* 36.1*  MCV 96.4 96.8  PLT 194 181   Basic Metabolic Panel: Recent Labs  Lab 01/27/18 1931 01/28/18 0111 01/28/18 0348  NA 141  --  141  K 4.4  --  3.9  CL 105  --  106  CO2 28  --  26   GLUCOSE 94  --  109*  BUN 26*  --  23*  CREATININE 1.81*  --  1.65*  CALCIUM 8.6*  --  8.3*  MG  --  2.2 2.0  PHOS  --  3.3 2.8   GFR: Estimated Creatinine Clearance: 48.6 mL/min (A) (by C-G formula based on SCr of 1.65 mg/dL (H)). Liver Function Tests: Recent Labs  Lab 01/28/18 0111 01/28/18 0348  AST 19 17  ALT 15* 16*  ALKPHOS 106 99  BILITOT 0.7 0.7  PROT 7.0 6.8  ALBUMIN 3.6 3.5   No results for input(s): LIPASE, AMYLASE in the last 168 hours. No results for input(s): AMMONIA in the last 168 hours. Coagulation Profile: No results for input(s): INR, PROTIME in the last 168 hours. Cardiac Enzymes: Recent Labs  Lab 01/27/18 1931 01/28/18 0111 01/28/18 0348  TROPONINI <0.03 <0.03 <0.03   BNP (last 3 results) No results for input(s): PROBNP in the last 8760 hours. HbA1C: No results for input(s): HGBA1C in the last 72 hours. CBG: No results for input(s): GLUCAP in the last 168 hours. Lipid Profile: No results for input(s): CHOL, HDL, LDLCALC, TRIG, CHOLHDL, LDLDIRECT in the last 72 hours. Thyroid Function Tests: Recent Labs    01/28/18 0348  TSH 3.852   Anemia Panel: No results for input(s): VITAMINB12, FOLATE, FERRITIN, TIBC, IRON, RETICCTPCT in the last 72 hours. Urine analysis:    Component Value Date/Time   COLORURINE YELLOW 01/27/2018 1847   APPEARANCEUR CLEAR 01/27/2018 1847   LABSPEC 1.023 01/27/2018 1847   PHURINE 5.0 01/27/2018 1847   GLUCOSEU NEGATIVE 01/27/2018 1847   HGBUR NEGATIVE 01/27/2018 1847   BILIRUBINUR NEGATIVE 01/27/2018 1847   KETONESUR NEGATIVE 01/27/2018 1847   PROTEINUR NEGATIVE 01/27/2018 1847   UROBILINOGEN 2.0 (H) 02/21/2015 1241   NITRITE NEGATIVE 01/27/2018 1847   LEUKOCYTESUR NEGATIVE 01/27/2018 1847   Sepsis Labs: @LABRCNTIP (procalcitonin:4,lacticidven:4) )No results found for this or any previous visit (from the past 240 hour(s)).       Radiology Studies: Dg Chest 2 View  Result Date: 01/28/2018 CLINICAL  DATA:  Acute onset of hypotension. Chronic intermittent dizziness. Multiple recent falls. EXAM: CHEST - 2 VIEW COMPARISON:  Chest radiograph performed 01/05/2018 FINDINGS: The lungs are mildly hypoexpanded. There is elevation of the right hemidiaphragm. Mild right basilar atelectasis is noted. Mild vascular crowding and vascular congestion is seen. The cardiomediastinal silhouette is borderline normal in size. The patient is status post median sternotomy. No acute osseous abnormalities are identified. Clips are noted within the right upper quadrant, reflecting prior cholecystectomy. IMPRESSION: Lungs mildly hypoexpanded, with elevation  of the right hemidiaphragm. Mild right basilar atelectasis noted. Mild vascular congestion seen. Electronically Signed   By: Roanna RaiderJeffery  Chang M.D.   On: 01/28/2018 00:11   Dg Abd 1 View  Result Date: 01/28/2018 CLINICAL DATA:  Chronic intermittent dizziness. Obstipation. EXAM: ABDOMEN - 1 VIEW COMPARISON:  Abdominal radiograph, and CT of the abdomen and pelvis performed 11/12/2009 FINDINGS: The visualized bowel gas pattern is unremarkable. Scattered air and stool filled loops of colon are seen; no abnormal dilatation of small bowel loops is seen to suggest small bowel obstruction. No free intra-abdominal air is identified, though evaluation for free air is limited on a single supine view. The visualized osseous structures are within normal limits; the sacroiliac joints are unremarkable in appearance. The visualized lung bases are essentially clear. The patient is status post median sternotomy. IMPRESSION: Unremarkable bowel gas pattern; no free intra-abdominal air seen. Small to moderate amount of stool noted in the colon. Electronically Signed   By: Roanna RaiderJeffery  Chang M.D.   On: 01/28/2018 00:10      Scheduled Meds: . apixaban  2.5 mg Oral BID  . atorvastatin  10 mg Oral q1800  . buPROPion  300 mg Oral Daily  . busPIRone  15 mg Oral BID  . carvedilol  25 mg Oral BID WC  .  doxycycline  100 mg Oral Q12H  . escitalopram  10 mg Oral Daily  . guaiFENesin  600 mg Oral BID  . ipratropium-albuterol  3 mL Nebulization QID  . lamoTRIgine  100 mg Oral QHS  . lamoTRIgine  200 mg Oral Daily  . levothyroxine  175 mcg Oral QAC breakfast  . methylPREDNISolone (SOLU-MEDROL) injection  40 mg Intravenous Q12H   Followed by  . [START ON 01/29/2018] predniSONE  40 mg Oral Q breakfast  . mirtazapine  15 mg Oral QHS  . senna  1 tablet Oral BID  . sodium chloride flush  3 mL Intravenous Q12H   Continuous Infusions:   LOS: 1 day    Time spent in minutes: 40     Calvert CantorSaima Marlow Hendrie, MD [facility] Pager: [email or web address] Password [email or web address] 01/28/2018, 10:13 AM

## 2018-01-28 NOTE — Progress Notes (Signed)
*  PRELIMINARY RESULTS* Vascular Ultrasound Right lower extremity venous duplex has been completed.  Preliminary findings: No evidence of deep vein thrombosis in the visualized veins of the right lower extremity.  Negative for baker's cyst.  Mid and distal calf veins were not visualized due to wound bandages.   [name] FischerCharlotte C Mackinley Kiehn 01/28/2018, 3:21 PM

## 2018-01-28 NOTE — Progress Notes (Signed)
PT in good spirits and helpful.   Still needs assist with ambulation and slides down in bed.  Uneventful day

## 2018-01-28 NOTE — Progress Notes (Signed)
ED TO INPATIENT HANDOFF REPORT  Name/Age/Gender [name] [name] 84 y.o. male  Code Status Code Status History    Date Active Date Inactive Code Status Order ID Comments User Context   01/05/2018 2057 01/10/2018 2127 Full Code [id number]  Cristy Folks, MD Inpatient   02/20/2015 1834 03/01/2015 1413 Full Code [id number]  Elizabeth Sauer Inpatient   02/11/2015 1402 02/20/2015 1834 Full Code [id number]  Marcene Corning Inpatient   11/11/2014 1636 02/11/2015 1402 Full Code [id number]  Hennie Duos, MD Outpatient   10/15/2014 0934 10/18/2014 1859 Partial Code [id number] DNI Karen Kitchens Inpatient   09/29/2014 1328 10/15/2014 0934 Full Code [id number]  Hennie Duos, MD Outpatient   06/20/2014 1513 06/22/2014 1653 Full Code [id number]  Caren Griffins, MD ED      Home/SNF/Other Home  Chief Complaint Low blood pressure, sent by dr  Level of Care/Admitting Diagnosis ED Disposition    ED Disposition Condition [place]: [facility] [[id number]]  Level of Care: Telemetry [5]  Admit to tele based on following criteria: Eval of Syncope  Diagnosis: Orthostatic hypotension [458.0.ICD-9-CM]  Admitting Physician: Toy Baker [[id number]]  Attending Physician: Toy Baker [[id number]]  Estimated length of stay: 3 - 4 days  Certification:: I certify this patient will need inpatient services for at least 2 midnights  PT Class (Do Not Modify): Inpatient [101]  PT Acc Code (Do Not Modify): Private [1]       Medical History Past Medical History:  Diagnosis Date  . Anxiety   . Aortic valve insufficiency   . Arthritis    "wee bit; knees, elbows" (10/15/2014)  . Atrial fibrillation ([place])   . Cardiomyopathy ([place])   . Chronic kidney disease    "down to ~ 1/2 of their regular use; I see kidney dr. in [address]" (10/15/2014)  . Complication of anesthesia    unsure of complications but pt. states there were complications  . COPD (chronic  obstructive pulmonary disease) ([place])   . Coronary artery disease   . Decreased testosterone level   . Depression   . Difficult intubation    difficult intubation 12/03/09 and 03/27/10 ([facility]); glidescope used 03/27/10  . DVT (deep venous thrombosis) ([place]) ~ 2013   "BLE"  . Dysrhythmia    A. Fib  . Emphysema of lung ([place])   . Enlarged prostate   . Falls frequently    > 20 times in the last year/notes 10/15/2014  . GERD (gastroesophageal reflux disease)   . Heart murmur   . History of blood transfusion 1960   "lots; related to an accident"  . History of echocardiogram    post AVR >> Echo 7/16:  Mild LVH, EF 20-25%, AVR ok (peak 15 mmHg, mean 9 mmHg), MAC, mild MR, severe LAE, mild reduced RVSF, mild RAE, PASP 35 mmHg  . Hypercholesteremia   . Hypertension   . Hypothyroidism   . OSA (obstructive sleep apnea)    "suppose to wear mask; I throw it off in my sleep" (10/15/2014)  . Plantar fasciitis   . Right fibular fracture 10/15/2014  . Seasonal allergies   . Shortness of breath dyspnea   . Syncope 10/15/2014  . Syncope and collapse "several times"  . Urine frequency   . Urine incontinence   . Varicose veins     Allergies No Known Allergies  IV Location/Drains/Wounds Patient Lines/Drains/Airways Status   Active Line/Drains/Airways    Name:   Placement date:  Placement time:   Site:   Days:   Peripheral IV 01/27/18 Right Forearm   01/27/18    2059    Forearm   1   Incision (Closed) 02/11/15 Chest Other (Comment)   02/11/15    1029     1082   Incision (Closed) 02/20/15 Thigh Left   02/20/15    1858     1073   Incision (Closed) 02/20/15 Leg Left   02/20/15    1858     1073   Incision (Closed) 02/20/15 Ankle Left   02/20/15    1858     1073   Incision (Closed) 02/20/15 Thigh Right   02/20/15    1858     1073   Incision (Closed) 02/20/15 Leg Right   02/20/15    1858     1073   Wound / Incision (Open or Dehisced) 01/05/18 Other (Comment) Leg Right   01/05/18    2100    Leg   23           Labs/Imaging Results for orders placed or performed during the hospital encounter of 01/27/18 (from the past 48 hour(s))  Urinalysis, Routine w reflex microscopic     Status: None   Collection Time: 01/27/18  6:47 PM  Result Value Ref Range   Color, Urine YELLOW YELLOW   APPearance CLEAR CLEAR   Specific Gravity, Urine 1.023 1.005 - 1.030   pH 5.0 5.0 - 8.0   Glucose, UA NEGATIVE NEGATIVE mg/dL   Hgb urine dipstick NEGATIVE NEGATIVE   Bilirubin Urine NEGATIVE NEGATIVE   Ketones, ur NEGATIVE NEGATIVE mg/dL   Protein, ur NEGATIVE NEGATIVE mg/dL   Nitrite NEGATIVE NEGATIVE   Leukocytes, UA NEGATIVE NEGATIVE    Comment: Performed at [facility], [place] [address] [place], [place] [address]  Basic metabolic panel     Status: Abnormal   Collection Time: 01/27/18  7:31 PM  Result Value Ref Range   Sodium 141 135 - 145 mmol/L   Potassium 4.4 3.5 - 5.1 mmol/L   Chloride 105 101 - 111 mmol/L   CO2 28 22 - 32 mmol/L   Glucose, Bld 94 65 - 99 mg/dL   BUN 26 (H) 6 - 20 mg/dL   Creatinine, Ser 1.81 (H) 0.61 - 1.24 mg/dL   Calcium 8.6 (L) 8.9 - 10.3 mg/dL   GFR calc non Af Amer 33 (L) >60 mL/min   GFR calc Af Amer 38 (L) >60 mL/min    Comment: (NOTE) The eGFR has been calculated using the CKD EPI equation. This calculation has not been validated in all clinical situations. eGFR's persistently <60 mL/min signify possible Chronic Kidney Disease.    Anion gap 8 5 - 15    Comment: Performed at [facility], [place] [address] [place], [place] [address]  CBC     Status: Abnormal   Collection Time: 01/27/18  7:31 PM  Result Value Ref Range   WBC 6.3 4.0 - 10.5 K/uL   RBC 3.91 (L) 4.22 - 5.81 MIL/uL   Hemoglobin 11.9 (L) 13.0 - 17.0 g/dL   HCT 37.7 (L) 39.0 - 52.0 %   MCV 96.4 78.0 - 100.0 fL   MCH 30.4 26.0 - 34.0 pg   MCHC 31.6 30.0 - 36.0 g/dL   RDW 14.7 11.5 - 15.5 %   Platelets 194 150 - 400 K/uL    Comment: Performed at [facility], [place] [address] [place],  [address]  Troponin I     Status: None   Collection Time: 01/27/18  7:31 PM  Result Value Ref Range   Troponin I <0.03 <0.03 ng/mL    Comment: Performed at [facility], [place] [address], [place], [place] [address]  Troponin I (q 6hr x 3)     Status: None   Collection Time: 01/28/18  1:11 AM  Result Value Ref Range   Troponin I <0.03 <0.03 ng/mL    Comment: Performed at [facility], [place] [address] [place], [place] [address]  Hepatic function panel     Status: Abnormal   Collection Time: 01/28/18  1:11 AM  Result Value Ref Range   Total Protein 7.0 6.5 - 8.1 g/dL   Albumin 3.6 3.5 - 5.0 g/dL   AST 19 15 - 41 U/L   ALT 15 (L) 17 - 63 U/L   Alkaline Phosphatase 106 38 - 126 U/L   Total Bilirubin 0.7 0.3 - 1.2 mg/dL   Bilirubin, Direct 0.2 0.1 - 0.5 mg/dL   Indirect Bilirubin 0.5 0.3 - 0.9 mg/dL    Comment: Performed at [facility], [place] [address] [place], [place] [address]  Magnesium     Status: None   Collection Time: 01/28/18  1:11 AM  Result Value Ref Range   Magnesium 2.2 1.7 - 2.4 mg/dL    Comment: Performed at [facility], [place] [address], [place], [place] [address]  Phosphorus     Status: None   Collection Time: 01/28/18  1:11 AM  Result Value Ref Range   Phosphorus 3.3 2.5 - 4.6 mg/dL    Comment: Performed at [facility], [place] [address], [place], [place] [address]   Dg Chest 2 View  Result Date: 01/28/2018 CLINICAL DATA:  Acute onset of hypotension. Chronic intermittent dizziness. Multiple recent falls. EXAM: CHEST - 2 VIEW COMPARISON:  Chest radiograph performed 01/05/2018 FINDINGS: The lungs are mildly hypoexpanded. There is elevation of the right hemidiaphragm. Mild right basilar atelectasis is noted. Mild vascular crowding and vascular congestion is seen. The cardiomediastinal silhouette is borderline normal in size. The  patient is status post median sternotomy. No acute osseous abnormalities are identified. Clips are noted within the right upper quadrant, reflecting prior cholecystectomy. IMPRESSION: Lungs mildly hypoexpanded, with elevation of the right hemidiaphragm. Mild right basilar atelectasis noted. Mild vascular congestion seen. Electronically Signed   By: Garald Balding M.D.   On: 01/28/2018 00:11   Dg Abd 1 View  Result Date: 01/28/2018 CLINICAL DATA:  Chronic intermittent dizziness. Obstipation. EXAM: ABDOMEN - 1 VIEW COMPARISON:  Abdominal radiograph, and CT of the abdomen and pelvis performed 11/12/2009 FINDINGS: The visualized bowel gas pattern is unremarkable. Scattered air and stool filled loops of colon are seen; no abnormal dilatation of small bowel loops is seen to suggest small bowel obstruction. No free intra-abdominal air is identified, though evaluation for free air is limited on a single supine view. The visualized osseous structures are within normal limits; the sacroiliac joints are unremarkable in appearance. The visualized lung bases are essentially clear. The patient is status post median sternotomy. IMPRESSION: Unremarkable bowel gas pattern; no free intra-abdominal air seen. Small to moderate amount of stool noted in the colon. Electronically Signed   By: Garald Balding M.D.   On: 01/28/2018 00:10    Pending Labs Unresulted Labs (From admission, onward)   Start     Ordered   01/28/18 0500  Prealbumin  Tomorrow morning,  R     01/27/18 2320   01/28/18 0007  Brain natriuretic peptide  Add-on,   R     01/28/18 0007   01/27/18 2353  Lamotrigine level  Once,   R     01/27/18 2352   01/27/18 2317  Troponin I (q 6hr x 3)  Now then every 6 hours,   R     01/27/18 2316   Signed and Held  Magnesium  Tomorrow morning,   R    Comments:  Call MD if <1.5    Signed and Held   Signed and Held  Phosphorus  Tomorrow morning,   R     Signed and Held   Signed and Held  TSH  Once,   R    Comments:   Cancel if already done within 1 month and notify MD    Signed and Held   Signed and Held  Comprehensive metabolic panel  Once,   R    Comments:  Cal MD for K<3.5 or >5.0    Signed and Held   Visual merchandiser and Held  CBC  Once,   R    Comments:  Call for hg <8.0    Signed and Held   Visual merchandiser and Held  TSH  Tomorrow morning,   R     Signed and Held   Signed and Held  Troponin I  Now then every 6 hours,   R     Signed and Held   Signed and Held  Hemoglobin A1c  Tomorrow morning,   R     Signed and Held   Signed and Held  Comprehensive metabolic panel  Tomorrow morning,   R     Signed and Held   Signed and Held  CBC  Tomorrow morning,   R     Signed and Held   Signed and Held  Culture, expectorated sputum-assessment  Once,   R    Question:  Patient immune status  Answer:  Normal   Signed and Held      Vitals/Pain Today's Vitals   01/27/18 2145 01/27/18 2200 01/27/18 2300 01/27/18 2330  BP:  (!) 132/92 (!) 142/92 116/76  Pulse: 70 72 73 61  Resp: (!) 23 (!) 24  17  Temp:      TempSrc:      SpO2: 92% 91% 90% 94%  Weight:      Height:      PainSc:        Isolation Precautions No active isolations  Medications Medications  0.9 %  sodium chloride infusion ( Intravenous New Bag/Given 01/27/18 2251)  doxycycline (VIBRAMYCIN) 100 mg in sodium chloride 0.9 % 250 mL IVPB (100 mg Intravenous New Bag/Given 01/28/18 0049)  sodium chloride 0.9 % bolus 500 mL (0 mLs Intravenous Stopped 01/27/18 2116)  sodium chloride 0.9 % bolus 250 mL (0 mLs Intravenous Stopped 01/27/18 2308)    Mobility walks with device

## 2018-01-28 NOTE — Progress Notes (Signed)
Pt with increased wheezing and SOB. Oxygen saturation between 86-88. 2L [place] placed on pt. Respiratory at bedside to give breathing tx. On call provider made aware. New orders placed to d/c IVF. Will continue to monitor closely.

## 2018-01-28 NOTE — Progress Notes (Signed)
Echocardiogram 2D Echocardiogram has been performed.  Dorothey BasemanReel, Torie Priebe M 01/28/2018, 2:38 PM

## 2018-01-28 NOTE — Consult Note (Addendum)
Cardiology Consultation:   Patient ID: [name] [name]; [id number]; 31-May-1932   Admit date: 01/27/2018 Date of Consult: 01/28/2018  Primary Care Provider: Aura Dials, MD Primary Cardiologist: Mertie Moores, MD    Patient Profile:   [name] [name] is a 84 y.o. male with a hx of coronary artery disease CABG atrial fibrillation who is being seen today for the evaluation of orthostatic hypotension at the request of Dr. Wynelle Cleveland.  History of Present Illness:   [name] is an 84 year old male with coronary artery disease status post CABG with permanent atrial fibrillation, COPD followed by pulmonary, aortic valve replacement here with orthostatic hypotension.  Blood pressure was in the 70s on arrival, dizzy.  Has had falls.  Had a cough, no fevers.  Past Medical History:  Diagnosis Date  . Anxiety   . Aortic valve insufficiency   . Arthritis    "wee bit; knees, elbows" (10/15/2014)  . Atrial fibrillation ([place])   . Cardiomyopathy ([place])   . Chronic kidney disease    "down to ~ 1/2 of their regular use; I see kidney dr. in [place]" (10/15/2014)  . Complication of anesthesia    unsure of complications but pt. states there were complications  . COPD (chronic obstructive pulmonary disease) ([place])   . Coronary artery disease   . Decreased testosterone level   . Depression   . Difficult intubation    difficult intubation 12/03/09 and 03/27/10 ([facility]); glidescope used 03/27/10  . DVT (deep venous thrombosis) ([place]) ~ 2013   "BLE"  . Dysrhythmia    A. Fib  . Emphysema of lung ([place])   . Enlarged prostate   . Falls frequently    > 20 times in the last year/notes 10/15/2014  . GERD (gastroesophageal reflux disease)   . Heart murmur   . History of blood transfusion 1960   "lots; related to an accident"  . History of echocardiogram    post AVR >> Echo 7/16:  Mild LVH, EF 20-25%, AVR ok (peak 15 mmHg, mean 9 mmHg), MAC, mild MR, severe LAE, mild reduced RVSF, mild RAE, PASP 35 mmHg  .  Hypercholesteremia   . Hypertension   . Hypothyroidism   . OSA (obstructive sleep apnea)    "suppose to wear mask; I throw it off in my sleep" (10/15/2014)  . Plantar fasciitis   . Right fibular fracture 10/15/2014  . Seasonal allergies   . Shortness of breath dyspnea   . Syncope 10/15/2014  . Syncope and collapse "several times"  . Urine frequency   . Urine incontinence   . Varicose veins     Past Surgical History:  Procedure Laterality Date  . ABDOMINAL AORTIC ANEURYSM REPAIR  01/2006; 03/10/2006   Archie Endo 01/12/2011  . AORTIC VALVE REPLACEMENT N/A 02/11/2015   Procedure: AORTIC VALVE REPLACEMENT (AVR);  Surgeon: Grace Isaac, MD;  Location: [facility];  Service: Open Heart Surgery;  Laterality: N/A;  . BRAIN SURGERY  1960 X 4   "S/P got my skull busted"; pt. states removed internal carotid artery  . CARDIAC CATHETERIZATION  1990's X 1; 09/2014   no stents  . CLIPPING OF ATRIAL APPENDAGE N/A 02/11/2015   Procedure: CLIPPING OF ATRIAL APPENDAGE;  Surgeon: Grace Isaac, MD;  Location: [facility];  Service: Open Heart Surgery;  Laterality: N/A;  . ERCP  11/2009   Archie Endo 12/05/2009  . gall stone    . HERNIA REPAIR    . LAPAROSCOPIC CHOLECYSTECTOMY  08/2009   w/IOC/notes 09/18/2009  . LAPAROSCOPIC INCISIONAL /  UMBILICAL / VENTRAL HERNIA REPAIR  02/2010   VHR w/mesh/notes 03/28/2010  . NOSE SURGERY    . TEE WITHOUT CARDIOVERSION N/A 02/11/2015   Procedure: TRANSESOPHAGEAL ECHOCARDIOGRAM (TEE);  Surgeon: Grace Isaac, MD;  Location: [facility];  Service: Open Heart Surgery;  Laterality: N/A;  . TONSILLECTOMY         Inpatient Medications: Scheduled Meds: . apixaban  2.5 mg Oral BID  . atorvastatin  10 mg Oral q1800  . buPROPion  300 mg Oral Daily  . busPIRone  15 mg Oral BID  . escitalopram  10 mg Oral Daily  . guaiFENesin  600 mg Oral BID  . ipratropium-albuterol  3 mL Nebulization QID  . lamoTRIgine  100 mg Oral QHS  . lamoTRIgine  200 mg Oral Daily  . levothyroxine  175 mcg Oral  QAC breakfast  . methylPREDNISolone (SOLU-MEDROL) injection  40 mg Intravenous Q12H   Followed by  . [START ON 01/29/2018] predniSONE  40 mg Oral Q breakfast  . mirtazapine  15 mg Oral QHS  . senna  1 tablet Oral BID  . sodium chloride flush  3 mL Intravenous Q12H   Continuous Infusions: . doxycycline (VIBRAMYCIN) IV Stopped (01/28/18 0249)   PRN Meds: acetaminophen **OR** acetaminophen, albuterol, bisacodyl, HYDROcodone-acetaminophen, ondansetron **OR** ondansetron (ZOFRAN) IV, polyethylene glycol  Allergies:   No Known Allergies  Social History:   Social History   Socioeconomic History  . Marital status: Married    Spouse name: Joycelyn Schmid  . Number of children: 1  . Years of education: Master's  . Highest education level: Not on file  Occupational History  . Not on file  Social Needs  . Financial resource strain: Not on file  . Food insecurity:    Worry: Not on file    Inability: Not on file  . Transportation needs:    Medical: Not on file    Non-medical: Not on file  Tobacco Use  . Smoking status: Former Smoker    Packs/day: 2.00    Years: 22.00    Pack years: 44.00    Types: Cigarettes    Last attempt to quit: 04/29/1974    Years since quitting: 43.7  . Smokeless tobacco: Never Used  Substance and Sexual Activity  . Alcohol use: Yes    Alcohol/week: 0.0 oz    Comment: 1 beer per year   . Drug use: No  . Sexual activity: Not Currently  Lifestyle  . Physical activity:    Days per week: Not on file    Minutes per session: Not on file  . Stress: Not on file  Relationships  . Social connections:    Talks on phone: Not on file    Gets together: Not on file    Attends religious service: Not on file    Active member of club or organization: Not on file    Attends meetings of clubs or organizations: Not on file    Relationship status: Not on file  . Intimate partner violence:    Fear of current or ex partner: Not on file    Emotionally abused: Not on file     Physically abused: Not on file    Forced sexual activity: Not on file  Other Topics Concern  . Not on file  Social History Narrative   Lives at home with wife.   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   Right  handed.   Caffeine use: 3 cups coffee per day.   Drinks 4 cups tea per day   Drinks soda very rarely     Family History:    Family History  Problem Relation Age of Onset  . Rheum arthritis Mother   . Diabetes Mother   . Heart disease Father   . Rheum arthritis Father   . Kidney disease Father   . Kidney failure Brother   . Breast cancer Sister   . Other Sister        Murdered  . Depression Sister   . Cancer Sister   . Stroke Sister   . Colon cancer Neg Hx   . Colon polyps Neg Hx   . Liver disease Neg Hx      ROS:  Please see the history of present illness.  All other ROS reviewed and negative.     Physical Exam/Data:   Vitals:   01/28/18 0304 01/28/18 0430 01/28/18 0602 01/28/18 0845  BP:      Pulse:      Resp:      Temp:      TempSrc:      SpO2: 91% (!) 89%  90%  Weight:   (!) 311 lb 11.7 oz (141.4 kg)   Height:        Intake/Output Summary (Last 24 hours) at 01/28/2018 0947 Last data filed at 01/28/2018 0600 Gross per 24 hour  Intake 1581.38 ml  Output 150 ml  Net 1431.38 ml   Filed Weights   01/27/18 1832 01/28/18 0602  Weight: (!) 315 lb (142.9 kg) (!) 311 lb 11.7 oz (141.4 kg)   Body mass index is 42.28 kg/m.  General: Elderly, obese well nourished, well developed, in no acute distress HEENT: normal Lymph: no adenopathy Neck: no JVD Endocrine:  No thryomegaly Vascular: No carotid bruits; FA pulses 2+ bilaterally without bruits  Cardiac:  normal S1, S2; irregularly irregular; no murmur  Lungs:  clear to auscultation bilaterally, no wheezing, rhonchi or rales  Abd: soft, nontender, no hepatomegaly  Ext: 2-3+ bilateral lower extremity  edema Musculoskeletal:  No deformities, BUE and BLE strength normal and equal Skin: warm and dry the lower extremity wound dressed. Neuro:  CNs 2-12 intact, no focal abnormalities noted Psych:  Normal affect   EKG:  The EKG was personally reviewed and demonstrates: Left bundle branch blocklike appearance, nonspecific ST-T wave changes, atrial fibrillation  Telemetry:  Telemetry was personally reviewed and demonstrates: Atrial fibrillation under good rate control  Relevant CV Studies:  Echocardiogram 10/21/2016:  - Left ventricle: The cavity size was normal. Wall thickness was   increased in a pattern of mild LVH. There was mild focal basal   hypertrophy of the septum. Systolic function was normal. The   estimated ejection fraction was in the range of 55% to 60%. Wall   motion was normal; there were no regional wall motion   abnormalities. - Aortic valve: A bioprosthesis was present. - Mitral valve: Calcified annulus. - Left atrium: The atrium was severely dilated. - Pulmonary arteries: Systolic pressure was mildly increased. PA   peak pressure: 32 mm Hg (S).  Impressions:  - Normal LV systolic function; mild LVH; s/p AVR with normal mean   gradient and no AI; severe LAE; trace TR; mildly elevated   pulmonary pressure.   Laboratory Data:  Chemistry Recent Labs  Lab 01/27/18 1931 01/28/18 0348  NA 141 141  K 4.4 3.9  CL 105 106  CO2 28 26  GLUCOSE 94 109*  BUN 26* 23*  CREATININE 1.81* 1.65*  CALCIUM 8.6* 8.3*  GFRNONAA 33* 37*  GFRAA 38* 42*  ANIONGAP 8 9    Recent Labs  Lab 01/28/18 0111 01/28/18 0348  PROT 7.0 6.8  ALBUMIN 3.6 3.5  AST 19 17  ALT 15* 16*  ALKPHOS 106 99  BILITOT 0.7 0.7   Hematology Recent Labs  Lab 01/27/18 1931 01/28/18 0348  WBC 6.3 5.9  RBC 3.91* 3.73*  HGB 11.9* 11.2*  HCT 37.7* 36.1*  MCV 96.4 96.8  MCH 30.4 30.0  MCHC 31.6 31.0  RDW 14.7 14.9  PLT 194 181   Cardiac Enzymes Recent Labs  Lab 01/27/18 1931  01/28/18 0111 01/28/18 0348  TROPONINI <0.03 <0.03 <0.03   No results for input(s): TROPIPOC in the last 168 hours.  BNP Recent Labs  Lab 01/28/18 0111  BNP 130.2*    DDimer No results for input(s): DDIMER in the last 168 hours.  Radiology/Studies:  Dg Chest 2 View  Result Date: 01/28/2018 CLINICAL DATA:  Acute onset of hypotension. Chronic intermittent dizziness. Multiple recent falls. EXAM: CHEST - 2 VIEW COMPARISON:  Chest radiograph performed 01/05/2018 FINDINGS: The lungs are mildly hypoexpanded. There is elevation of the right hemidiaphragm. Mild right basilar atelectasis is noted. Mild vascular crowding and vascular congestion is seen. The cardiomediastinal silhouette is borderline normal in size. The patient is status post median sternotomy. No acute osseous abnormalities are identified. Clips are noted within the right upper quadrant, reflecting prior cholecystectomy. IMPRESSION: Lungs mildly hypoexpanded, with elevation of the right hemidiaphragm. Mild right basilar atelectasis noted. Mild vascular congestion seen. Electronically Signed   By: Garald Balding M.D.   On: 01/28/2018 00:11   Dg Abd 1 View  Result Date: 01/28/2018 CLINICAL DATA:  Chronic intermittent dizziness. Obstipation. EXAM: ABDOMEN - 1 VIEW COMPARISON:  Abdominal radiograph, and CT of the abdomen and pelvis performed 11/12/2009 FINDINGS: The visualized bowel gas pattern is unremarkable. Scattered air and stool filled loops of colon are seen; no abnormal dilatation of small bowel loops is seen to suggest small bowel obstruction. No free intra-abdominal air is identified, though evaluation for free air is limited on a single supine view. The visualized osseous structures are within normal limits; the sacroiliac joints are unremarkable in appearance. The visualized lung bases are essentially clear. The patient is status post median sternotomy. IMPRESSION: Unremarkable bowel gas pattern; no free intra-abdominal air seen.  Small to moderate amount of stool noted in the colon. Electronically Signed   By: Garald Balding M.D.   On: 01/28/2018 00:10    Assessment and Plan:   Orthostatic hypotension - Seems to be getting worse.  He was 70 systolic previously.  This occurs when standing up.  Recent cellulitis may be exacerbating. -Agree with stopping amlodipine 2.5 mg a day.  Holding carvedilol 25 mg twice a day.  We will look out for atrial fibrillation rapid ventricular response because of cessation of beta-blocker.  I will go ahead and resume this medication now his blood pressure is back up.  May need to consider midodrine in future.  CAD post CABG -Stable  Aortic valve replacement -Stable  Permanent atrial fibrillation - Stable, Eliquis low-dose.  Age greater than 23, creatinine greater than 1.5  Morbid obesity - Continue to encourage weight loss.  Contributing significantly to his lower extremity edema  COPD -Followed by pulmonary medicine as outpatient.  Currently with mild wheeze.  Kidney disease -Baseline 1.7 creatinine.  Left bundle branch block -  Chronic, previously borderline LBBB.    For questions or updates, please contact [place] Please consult [email or web address] for contact info under Cardiology/STEMI.   Signed, Candee Furbish, MD  01/28/2018 9:47 AM

## 2018-01-28 NOTE — Plan of Care (Signed)
Patient stable during 7 a to 7 p shift, attempted to wean oxygen, continues to drop to 88-90% on room air.  Patient up to chair for several hours, walked to bathroom several times with standby assist only.  Patient continues to wheeze but states he wheezes at home and does not seem to feel that his current respiratory status is much different than his baseline.  Son and wife at bedside for part of shift.

## 2018-01-29 DIAGNOSIS — R3911 Hesitancy of micturition: Secondary | ICD-10-CM

## 2018-01-29 DIAGNOSIS — L03818 Cellulitis of other sites: Secondary | ICD-10-CM

## 2018-01-29 DIAGNOSIS — N401 Enlarged prostate with lower urinary tract symptoms: Secondary | ICD-10-CM

## 2018-01-29 DIAGNOSIS — N183 Chronic kidney disease, stage 3 unspecified: Secondary | ICD-10-CM

## 2018-01-29 DIAGNOSIS — I951 Orthostatic hypotension: Principal | ICD-10-CM

## 2018-01-29 LAB — LAMOTRIGINE LEVEL: LAMOTRIGINE LVL: 9.1 ug/mL (ref 2.0–20.0)

## 2018-01-29 MED ORDER — FAMOTIDINE 20 MG PO TABS: 20.0000 mg | ORAL_TABLET | Freq: Two times a day (BID) | ORAL | 1 refills | Status: DC | PRN

## 2018-01-29 MED ORDER — GUAIFENESIN ER 600 MG PO TB12: 600.0000 mg | ORAL_TABLET | Freq: Two times a day (BID) | ORAL | 0 refills | Status: DC

## 2018-01-29 MED ORDER — POLYETHYLENE GLYCOL 3350 17 G PO PACK: 17.0000 g | PACK | Freq: Every day | ORAL | 0 refills | Status: DC | PRN

## 2018-01-29 MED ORDER — PREDNISONE 20 MG PO TABS: 40.0000 mg | ORAL_TABLET | Freq: Every day | ORAL | 0 refills | Status: DC

## 2018-01-29 MED ORDER — TOLNAFTATE 1 % EX POWD: Freq: Two times a day (BID) | CUTANEOUS | 0 refills | Status: DC

## 2018-01-29 MED ORDER — IPRATROPIUM-ALBUTEROL 0.5-2.5 (3) MG/3ML IN SOLN: 3.0000 mL | Freq: Four times a day (QID) | RESPIRATORY_TRACT | 0 refills | Status: DC

## 2018-01-29 MED ORDER — SENNA 8.6 MG PO TABS: 1.0000 | ORAL_TABLET | Freq: Two times a day (BID) | ORAL | 0 refills | Status: DC

## 2018-01-29 MED ORDER — IPRATROPIUM-ALBUTEROL 0.5-2.5 (3) MG/3ML IN SOLN: 3.0000 mL | RESPIRATORY_TRACT | 0 refills | Status: DC | PRN

## 2018-01-29 MED ORDER — POTASSIUM CHLORIDE ER 10 MEQ PO TBCR: 10.0000 meq | EXTENDED_RELEASE_TABLET | Freq: Every day | ORAL | 6 refills | Status: DC | PRN

## 2018-01-29 MED ORDER — CALCIUM CARBONATE ANTACID 500 MG PO CHEW: 1.0000 | CHEWABLE_TABLET | Freq: Three times a day (TID) | ORAL | 0 refills | Status: DC | PRN

## 2018-01-29 MED ORDER — DOXYCYCLINE HYCLATE 100 MG PO TABS: 100.0000 mg | ORAL_TABLET | Freq: Two times a day (BID) | ORAL | 0 refills | Status: DC

## 2018-01-29 MED ORDER — FUROSEMIDE 20 MG PO TABS: 20.0000 mg | ORAL_TABLET | Freq: Every day | ORAL | 5 refills | Status: DC | PRN

## 2018-01-29 MED ORDER — CALCIUM CARBONATE ANTACID 500 MG PO CHEW
1.0000 | CHEWABLE_TABLET | Freq: Three times a day (TID) | ORAL | Status: DC | PRN
  Administered 2018-01-29: 200 mg via ORAL
  Filled 2018-01-29: qty 1

## 2018-01-29 NOTE — Discharge Instructions (Signed)
Do not take Lasix uless you are gaining weight. If you gain > 3 lb in 24 hr, take a 20 mg tab (along with potassium) and cut back on how much fluid you are drinking and how much salt you are eating. Do this daily until your weight comes down.  Keep legs elevated to the the level of your abdomen when you are not walking. Use the incentive spirometer every hour when you are not walking. Try to lose weight by eating more protein and less carbohydrates.  Your Amlodipine has been stopped as it can make leg swelling worse. Check your BP the same time each day after sitting for 15 min. Keep a record of this and take you your doctor. See your doctor next week.    You were cared for by a hospitalist during your hospital stay. If you have any questions about your discharge medications or the care you received while you were in the hospital after you are discharged, you can call the unit and asked to speak with the hospitalist on call if the hospitalist that took care of you is not available. Once you are discharged, your primary care physician will handle any further medical issues. Please note that NO REFILLS for any discharge medications will be authorized once you are discharged, as it is imperative that you return to your primary care physician (or establish a relationship with a primary care physician if you do not have one) for your aftercare needs so that they can reassess your need for medications and monitor your lab values.  Please take all your medications with you for your next visit with your Primary MD. Please ask your Primary MD to get all Hospital records sent to his/her office. Please request your Primary MD to go over all hospital test results at the follow up.    If you experience worsening of your admission symptoms, develop shortness of breath, chest pain, suicidal or homicidal thoughts or a life threatening emergency, you must seek medical attention immediately by calling 911 or calling  your MD.   [name] QuinYou must read the complete instructions/literature along with all the possible adverse reactions/side effects for all the medicines you take including new medications that have been prescribed to you. Take new medicines after you have completely understood and accpet all the possible adverse reactions/side effects.    Do not drive when taking pain medications or sedatives.     Do not take more than prescribed Pain, Sleep and Anxiety Medications   If you have smoked or chewed Tobacco in the last 2 yrs please stop. Stop any regular alcohol  and or recreational drug use.   Wear Seat belts while driving.

## 2018-01-29 NOTE — Progress Notes (Signed)
Waiting on home oxygen before discharge.

## 2018-01-29 NOTE — Progress Notes (Signed)
Patient C/O mid upper abd pain, 5 out of 10 pain scale, patient he has the pain at home at times. vital WNL-125/85, 73, 94%-1L, Dr. Butler Denmarkizwan notified, Tums ordered and given, offered pain med patient refused and stated he feels better already. Will continue to assess patient.

## 2018-01-29 NOTE — Progress Notes (Signed)
Patient discharged home with son. Discharge instructions given and explained to patient/son and they verbalized understanding, patient denies any pain/distress. Accompanied home by son, transported to the car by staff via wheelchair with home oxygen. No wound noted, skin intact.

## 2018-01-29 NOTE — Evaluation (Signed)
Physical Therapy Evaluation Patient Details Name: [name] [name] MRN: [id number] DOB: 04-26-1932 Today's Date: 01/29/2018   History of Present Illness  Rayshard Schirtzinger 84 y.o. with medical history significant of aortic insufficiencystatus post AVR, d CHF on Lasix, CAD/ CABG, hyperlipidemia, HTN, OSA, AAA, hypothyroidism, COPD, A. fib , hypertension, CKD & DVT.  Pt admitted with BLE edema and cellulitis  Clinical Impression  Pt ambulated 50' x 2 with RW and 2L O2. SaO2 88% walking, noted pt was mostly mouth breathing. SaO2 came up to 97% on 2L with pursed lip breathing in sitting. Ambulation distance limited by dyspnea. Of concern, pt reports 23 falls in the past 18 months. He reports he's had full work up and was told Drs think this is related to prior head injury. Pt did have loss of balance within 1st couple steps of his walk, he required mod assist to avoid a fall.  HHPT recommended to address home safety and balance issues.     Follow Up Recommendations Home health PT    Equipment Recommendations  Other (comment)(rollator)    Recommendations for Other Services       Precautions / Restrictions Precautions Precautions: Fall Precaution Comments: pt reports 23 falls in past 18 months, he stated he's had full workup and Dr's think it might be related to a prior head injury, he has brief LOC in these episodes, most recent 2 weeks ago      Mobility  Bed Mobility               General bed mobility comments: pt in chair  Transfers Overall transfer level: Needs assistance Equipment used: Rolling walker (2 wheeled) Transfers: Sit to/from Stand Sit to Stand: Supervision Stand pivot transfers: Min assist       General transfer comment: VC for hand placement and to not pull on walker, unsteady initially in standing, denied dizziness  Ambulation/Gait Ambulation/Gait assistance: Min guard;Mod assist Ambulation Distance (Feet): 100 Feet(50' x 2 with seated rest break) Assistive  device: Rolling walker (2 wheeled) Gait Pattern/deviations: Step-through pattern Gait velocity: WNL   General Gait Details: pt had loss of balance requiring mod assist when he took 1st couple steps, then was steady with RW for 50' x 2 with seated rest break. Distance limited by dyspnea.  Pt on 2L O2, SaO2 88% walking, pt primarily mouth breathing, SaO2 up to 97% on 2L with pursed lip breathing during seated rest after walking.   Stairs            Wheelchair Mobility    Modified Rankin (Stroke Patients Only)       Balance Overall balance assessment: History of Falls;Needs assistance   Sitting balance-Leahy Scale: Good     Standing balance support: Bilateral upper extremity supported Standing balance-Leahy Scale: Poor Standing balance comment: requires BUE support                             Pertinent Vitals/Pain Pain Assessment: No/denies pain    Home Living Family/patient expects to be discharged to:: Private residence Living Arrangements: Spouse/significant other;Children Available Help at Discharge: Family Type of Home: House Home Access: Level entry     Home Layout: Two level Home Equipment: Environmental consultant - 2 wheels;Cane - single point      Prior Function Level of Independence: Independent with assistive device(s)         Comments: walks with RW, was getting HHPT prior to admission, frequent falls  Hand Dominance        Extremity/Trunk Assessment   Upper Extremity Assessment Upper Extremity Assessment: Defer to OT evaluation    Lower Extremity Assessment Lower Extremity Assessment: Overall WFL for tasks assessed(B knee ext 4/5)    Cervical / Trunk Assessment Cervical / Trunk Assessment: Normal  Communication   Communication: No difficulties  Cognition Arousal/Alertness: Awake/alert Behavior During Therapy: WFL for tasks assessed/performed Overall Cognitive Status: Within Functional Limits for tasks assessed                                         General Comments      Exercises     Assessment/Plan    PT Assessment All further PT needs can be met in the next venue of care  PT Problem List Decreased balance;Decreased activity tolerance       PT Treatment Interventions      PT Goals (Current goals can be found in the Care Plan section)  Acute Rehab PT Goals Patient Stated Goal: get home to wife PT Goal Formulation: All assessment and education complete, DC therapy    Frequency     Barriers to discharge        Co-evaluation               AM-PAC PT "6 Clicks" Daily Activity  Outcome Measure Difficulty turning over in bed (including adjusting bedclothes, sheets and blankets)?: None Difficulty moving from lying on back to sitting on the side of the bed? : None Difficulty sitting down on and standing up from a chair with arms (e.g., wheelchair, bedside commode, etc,.)?: A Little Help needed moving to and from a bed to chair (including a wheelchair)?: A Little Help needed walking in hospital room?: A Little Help needed climbing 3-5 steps with a railing? : A Lot 6 Click Score: 19    End of Session Equipment Utilized During Treatment: Gait belt;Oxygen Activity Tolerance: Patient limited by fatigue Patient left: in chair;with call bell/phone within reach;with family/visitor present Nurse Communication: Mobility status PT Visit Diagnosis: Unsteadiness on feet (R26.81);Repeated falls (R29.6)    Time: 4383-8184 PT Time Calculation (min) (ACUTE ONLY): 20 min   Charges:   PT Evaluation $PT Eval Low Complexity: 1 Low     PT G Codes:          Philomena Doheny 01/29/2018, 2:14 PM [phone]

## 2018-01-29 NOTE — Evaluation (Signed)
Occupational Therapy Evaluation Patient Details Name: [name] ClusterRobert [name] MRN: [id number] DOB: [birth year] Today's Date: 01/29/2018    History of Present Illness [name] ClusterRobert [name] 84 y.o. with medical history significant of aortic insufficiencystatus post AVR, d CHF on Lasix, CAD/ CABG, hyperlipidemia, HTN, OSA, AAA, hypothyroidism, COPD, A. fib , hypertension, CKD & DVT.  Pt admitted with BLE edema and cellulitis   Clinical Impression   Pt admitted with BLE cellulitis. Pt currently with functional limitations due to the deficits listed below (see OT Problem List).  Pt will benefit from skilled OT to increase their safety and independence with ADL and functional mobility for ADL to facilitate discharge to venue listed below.      Follow Up Recommendations  Home health OT;Supervision/Assistance - 24 hour    Equipment Recommendations       Recommendations for Other Services       Precautions / Restrictions Precautions Precautions: Fall Restrictions Weight Bearing Restrictions: No      Mobility Bed Mobility               General bed mobility comments: pt in chair  Transfers Overall transfer level: Needs assistance Equipment used: Rolling walker (2 wheeled) Transfers: Sit to/from Stand;Stand Pivot Transfers Sit to Stand: Min assist Stand pivot transfers: Min assist       General transfer comment: VC for hand placement and to not pull on walker    Balance Overall balance assessment: Mild deficits observed, not formally tested                                         ADL either performed or assessed with clinical judgement   ADL Overall ADL's : Needs assistance/impaired Eating/Feeding: Set up;Sitting   Grooming: Set up;Sitting   Upper Body Bathing: Set up;Sitting   Lower Body Bathing: Moderate assistance;Sit to/from stand;Cueing for sequencing;Cueing for safety   Upper Body Dressing : Set up;Sitting   Lower Body Dressing: Moderate assistance;Sit  to/from stand;Cueing for sequencing;Cueing for safety   Toilet Transfer: Minimal assistance;Stand-pivot;RW;Cueing for sequencing;Cueing for safety   Toileting- Clothing Manipulation and Hygiene: Minimal assistance;Sit to/from stand               Vision Patient Visual Report: No change from baseline       Perception     Praxis      Pertinent Vitals/Pain Pain Assessment: No/denies pain     Hand Dominance     Extremity/Trunk Assessment Upper Extremity Assessment Upper Extremity Assessment: Generalized weakness           Communication Communication Communication: No difficulties   Cognition Arousal/Alertness: Awake/alert Behavior During Therapy: WFL for tasks assessed/performed Overall Cognitive Status: Within Functional Limits for tasks assessed                                                Home Living Family/patient expects to be discharged to:: Private residence Living Arrangements: Spouse/significant other;Children Available Help at Discharge: Family Type of Home: House Home Access: Level entry     Home Layout: Two level   Alternate Level Stairs-Rails: Right Bathroom Shower/Tub: Walk-in shower         Home Equipment: Environmental consultantWalker - 2 wheels;Cane - single point          Prior Functioning/Environment  Level of Independence: Independent with assistive device(s)                 OT Problem List: Decreased strength;Decreased activity tolerance;Decreased knowledge of use of DME or AE      OT Treatment/Interventions: Self-care/ADL training;Patient/family education;DME and/or AE instruction    OT Goals(Current goals can be found in the care plan section) Acute Rehab OT Goals Patient Stated Goal: get home to wife OT Goal Formulation: With patient Time For Goal Achievement: 02/12/18 Potential to Achieve Goals: Good  OT Frequency: Min 2X/week   Barriers to D/C:            Co-evaluation              AM-PAC PT "6  Clicks" Daily Activity     Outcome Measure Help from another person eating meals?: None Help from another person taking care of personal grooming?: None Help from another person toileting, which includes using toliet, bedpan, or urinal?: A Little Help from another person bathing (including washing, rinsing, drying)?: A Little Help from another person to put on and taking off regular upper body clothing?: A Little Help from another person to put on and taking off regular lower body clothing?: A Lot 6 Click Score: 19   End of Session Equipment Utilized During Treatment: Rolling walker Nurse Communication: Mobility status  Activity Tolerance: Patient tolerated treatment well Patient left: in chair;with call bell/phone within reach  OT Visit Diagnosis: Unsteadiness on feet (R26.81);History of falling (Z91.81);Other abnormalities of gait and mobility (R26.89)                Time: 1020-1055 OT Time Calculation (min): 35 min Charges:  OT General Charges $OT Visit: 1 Visit OT Evaluation $OT Eval Moderate Complexity: 1 Mod OT Treatments $Self Care/Home Management : 8-22 mins G-Codes:     Lise Auer, OT [phone]  Einar Crow D 01/29/2018, 11:00 AM

## 2018-01-29 NOTE — Discharge Summary (Signed)
Physician Discharge Summary  Raza Bayless PIR:[id number] DOB: 1932/03/23 DOA: 01/27/2018  PCP: Henrine Screws, MD  Admit date: 01/27/2018 Discharge date: 01/29/2018  Admitted From: home Disposition:  home   Recommendations for Outpatient Follow-up:  1. Ambulatory pulse ox- gong home with O2- wean O2 as able 2. Nebs and Neb machine prescribed 3. HHRN recommended to do dressing changes M/W/F 4. Encourage weight loss  Home Health:  ordered  Equipment/Devices:  Walker, Neb machine, portable O2  Discharge Condition:  stable   CODE STATUS:  Full code   Consultations:  none    Discharge Diagnoses:  Principal Problem:   Symptomatic   Orthostatic hypotension Active Problems:     Cellulitis   COPD with acute exacerbation (HCC)   Paroxysmal A-fib (HCC)   Hypothyroidism   Essential hypertension   Hyperlipidemia, unspecified   AR (aortic regurgitation)   CAD (coronary artery disease), native coronary artery   S/P AVR   BPH (benign prostatic hyperplasia)   Chronic diastolic heart failure (HCC)   COPD (chronic obstructive pulmonary disease) with chronic bronchitis (HCC)   CKD (chronic kidney disease) stage 3, GFR 30-59 ml/min (HCC) Obstructive sleep apnea     Brief Summary: [name] [name] 84 y.o. with medical history significant of aortic insufficiencystatus post AVR, d CHF on Lasix, CAD/ CABG, hyperlipidemia, HTN, OSA, AAA, hypothyroidism, COPD, A. fib , hypertension, CKD & DVT.   and mood disorder. He lives with his wife who has Alzheimers and he takes care of her.  He went to an Urgent care on [address] about 10 days ago and had a nurse came out the the house and wrapped both his legs. Someone named Simonne Come came on Wednesday to rewrap his legs. He then had a speech therapist who checked his BP, found it to be low and told him to come to the ED. The same day, in the afternoon a PT came and said the same. He declined to go. Later the same day he decided to come to the ED.      Hospital Course:  Active Problems:   Orthostatic hypotension Chronic dCHF - Lasix on hold- bolusing with IVF and after 2 L , orthostatic vitals noted to be negative - has ambulated in the the hall today without dizziness - 2 D ECHO shows that his EF is 55-60% - cardiology recommends 20 mg of Lasix rather than 40 and to hold if BP is low however, it appears that he has actually been taking 20 mg at home daily- I have changed this to PRN based on weight gain- see below- I have written out instructions  LE edema- cellulitis around ulcerated blisters - mostly right LE with is twice the size of the left with blisters and focal erythema - HHRN has begun wrapping legs at home - venous duplex is negative- wrapped in ACE wraps  - started on Doxycycline for 1 wk - I have advised him strictly to keep legs elevated   COPD exacerbation - given IV Solumedrol and then tapered to Prednisone 40 mg daily- cont Nebs and Doxy - pulse ox drops to mid 80s when ambulating today- IS given- home with 3 L O2 on exertion    Paroxysmal A-fib rate controlled - cont DOAC and Coreg  CKD3 - stable  Tenia pedis - Tinacetin started    Hypothyroidism - cont synthroid    OSA - does not wear CPAP  Mood disorder - Buspar, Lamictal  Morbid obesity Body mass index is 42.28 kg/m.  - son  feels he has gained 20 lb in the past year       Discharge Exam: Vitals:   01/29/18 0925 01/29/18 1100  BP:  125/85  Pulse:  73  Resp:  20  Temp:    SpO2: 96% 94%   Vitals:   01/29/18 0500 01/29/18 0838 01/29/18 0925 01/29/18 1100  BP:  (!) 158/106  125/85  Pulse:  94  73  Resp:    20  Temp:      TempSrc:      SpO2:  94% 96% 94%  Weight: (!) 143.8 kg (317 lb 0.3 oz)     Height:         General exam: Appears comfortable  HEENT: PERRLA, oral mucosa moist, no sclera icterus or thrush Respiratory system: Clear to auscultation. Respiratory effort normal. Cardiovascular system: S1 & S2 heard,  RRR.   Gastrointestinal system: Abdomen soft, morbid obesity- non-tender, nondistended. Normal bowel sound. No organomegaly Central nervous system: Alert and oriented. No focal neurological deficits. Extremities: No cyanosis, clubbing- b/l LE edema with right leg being twice the size as left, blisters and surround erythema noted Skin: see above- fungal infection on feet Psychiatry:  Mood & affect appropriate.    Discharge Instructions  Discharge Instructions    DME Nebulizer machine   Complete by:  As directed    Patient needs a nebulizer to treat with the following condition:  COPD exacerbation (HCC)   Diet - low sodium heart healthy   Complete by:  As directed    Diet - low sodium heart healthy   Complete by:  As directed    Increase activity slowly   Complete by:  As directed    Increase activity slowly   Complete by:  As directed      Allergies as of 01/29/2018   No Known Allergies     Medication List    STOP taking these medications   amLODipine 2.5 MG tablet Commonly known as:  NORVASC     TAKE these medications   atorvastatin 10 MG tablet Commonly known as:  LIPITOR Take 1 tablet (10 mg total) by mouth daily.   buPROPion 300 MG 24 hr tablet Commonly known as:  WELLBUTRIN XL Take 1 tablet (300 mg total) by mouth daily.   busPIRone 15 MG tablet Commonly known as:  BUSPAR Take 1 tablet (15 mg total) by mouth 2 (two) times daily. What changed:  Another medication with the same name was removed. Continue taking this medication, and follow the directions you see here.   calcium carbonate 500 MG chewable tablet Commonly known as:  TUMS - dosed in mg elemental calcium Chew 1 tablet (200 mg of elemental calcium total) by mouth 3 (three) times daily as needed for indigestion or heartburn.   carvedilol 25 MG tablet Commonly known as:  COREG TAKE 1 TABLET BY MOUTH TWICE A DAY   clonazePAM 0.5 MG tablet Commonly known as:  KLONOPIN Take 1 tablet (0.5 mg total) by  mouth at bedtime.   doxycycline 100 MG tablet Commonly known as:  VIBRA-TABS Take 1 tablet (100 mg total) by mouth every 12 (twelve) hours.   dutasteride 0.5 MG capsule Commonly known as:  AVODART Take 1 capsule (0.5 mg total) by mouth daily.   ELIQUIS 2.5 MG Tabs tablet Generic drug:  apixaban TAKE 1 TABLET (2.5 MG TOTAL) BY MOUTH 2 (TWO) TIMES DAILY.   escitalopram 10 MG tablet Commonly known as:  LEXAPRO Take 10 mg by mouth daily. What  changed:  Another medication with the same name was removed. Continue taking this medication, and follow the directions you see here.   famotidine 20 MG tablet Commonly known as:  PEPCID Take 1 tablet (20 mg total) by mouth 2 (two) times daily as needed for heartburn or indigestion.   folic acid 1 MG tablet Commonly known as:  FOLVITE Take 1 tablet (1 mg total) by mouth daily.   furosemide 20 MG tablet Commonly known as:  LASIX Take 1 tablet (20 mg total) by mouth daily as needed for fluid. Only if you gain > 3 lb in 24 hrs What changed:    when to take this  reasons to take this  additional instructions   guaiFENesin 600 MG 12 hr tablet Commonly known as:  MUCINEX Take 1 tablet (600 mg total) by mouth 2 (two) times daily.   ipratropium-albuterol 0.5-2.5 (3) MG/3ML Soln Commonly known as:  DUONEB Take 3 mLs by nebulization every 4 (four) hours as needed (for wheezing associated with shortness of breath).   KLOR-CON M10 10 MEQ tablet Generic drug:  potassium chloride TAKE 1 TABLET (10 MEQ TOTAL) BY MOUTH DAILY.   lamoTRIgine 100 MG tablet Commonly known as:  LAMICTAL Take 1 tablet (100 mg total) by mouth 2 (two) times daily. Breakfast and dinner What changed:    how much to take  when to take this  additional instructions   levothyroxine 175 MCG tablet Commonly known as:  SYNTHROID, LEVOTHROID Take 175 mcg by mouth daily before breakfast.   mirtazapine 7.5 MG tablet Commonly known as:  REMERON Take 15 mg by mouth at  bedtime.   omeprazole 20 MG capsule Commonly known as:  PRILOSEC Take 1 capsule (20 mg total) by mouth daily.   polyethylene glycol packet Commonly known as:  MIRALAX / GLYCOLAX Take 17 g by mouth daily as needed for mild constipation.   potassium chloride 10 MEQ tablet Commonly known as:  K-DUR Take 1 tablet (10 mEq total) by mouth daily as needed. What changed:    when to take this  reasons to take this  Another medication with the same name was removed. Continue taking this medication, and follow the directions you see here.   predniSONE 20 MG tablet Commonly known as:  DELTASONE Take 2 tablets (40 mg total) by mouth daily with breakfast. Start taking on:  01/30/2018   senna 8.6 MG Tabs tablet Commonly known as:  SENOKOT Take 1 tablet (8.6 mg total) by mouth 2 (two) times daily.   Tiotropium Bromide-Olodaterol 2.5-2.5 MCG/ACT Aers Commonly known as:  STIOLTO RESPIMAT Inhale 2 puffs into the lungs daily.   tolnaftate 1 % powder Commonly known as:  TINACTIN Apply topically 2 (two) times daily.            Durable Medical Equipment  (From admission, onward)        Start     Ordered   01/29/18 1119  For home use only DME 4 wheeled rolling walker with seat  Once    Comments:  bariatric  Question:  Patient needs a walker to treat with the following condition  Answer:  COPD (chronic obstructive pulmonary disease) (HCC)   01/29/18 1118   01/29/18 1111  For home use only DME Nebulizer machine  Once    Question:  Patient needs a nebulizer to treat with the following condition  Answer:  COPD exacerbation (HCC)   01/29/18 1113   01/29/18 1056  For home use only DME oxygen  Once    Question Answer Comment  Mode or (Route) Nasal cannula   Liters per Minute 3   Frequency Continuous (stationary and portable oxygen unit needed)   Oxygen conserving device Yes   Oxygen delivery system Gas      01/29/18 1055   01/29/18 0000  DME Nebulizer machine    Question:  Patient  needs a nebulizer to treat with the following condition  Answer:  COPD exacerbation (HCC)   01/29/18 1610     Follow-up Information    Henrine Screws, MD Follow up in 1 week(s).   Specialty:  Family Medicine Why:  please check ambulatory pulse ox and orthostatic vitals Contact information: [address] [place] [address] [phone]        Nahser, Deloris Ping, MD .   Specialty:  Cardiology Contact information: [address] [place] [address] [phone]        [facility]-Home Follow up.   Specialty:  Home Health Services Why:  Home Health RN, Physical Therapy, Occupational Therapy, aide, Social Worker, and Occupational Therapy- agency will call to arrange initial visit Contact information: [address] [place] [address] [phone]        [facility] - Dme Follow up.   Why:  will provided oxygen, Rolling Walker with seat and nebulizer machine  Contact information: [address] [place] [address] [phone]          No Known Allergies   Procedures/Studies: Study Conclusions  - Left ventricle: The cavity size was normal. Systolic function was   normal. The estimated ejection fraction was in the range of 60%   to 65%. Wall motion was normal; there were no regional wall   motion abnormalities. Doppler parameters are consistent with high   ventricular filling pressure. - Aortic valve: A prosthesis was present and functioning normally.   The prosthesis had a normal range of motion. The sewing ring   appeared normal, had no rocking motion, and showed no evidence of   dehiscence. Peak velocity (S): 254 cm/s. Mean gradient (S): 13 mm   Hg. Valve area (VTI): 1.91 cm^2. Valve area (Vmax): 1.8 cm^2.   Valve area (Vmean): 1.94 cm^2. - Aorta: Ascending aortic diameter: 42 mm (S). - Ascending aorta: The ascending aorta was mildly dilated. - Mitral valve: There was mild  regurgitation. - Left atrium: The atrium was severely dilated. - Right ventricle: The cavity size was moderately dilated. Wall   thickness was normal. - Right atrium: The atrium was severely dilated. - Pulmonary arteries: Systolic pressure was mildly increased.  Impressions:  - EF is improved when compared to prior.   Dg Chest 2 View  Result Date: 01/28/2018 CLINICAL DATA:  Acute onset of hypotension. Chronic intermittent dizziness. Multiple recent falls. EXAM: CHEST - 2 VIEW COMPARISON:  Chest radiograph performed 01/05/2018 FINDINGS: The lungs are mildly hypoexpanded. There is elevation of the right hemidiaphragm. Mild right basilar atelectasis is noted. Mild vascular crowding and vascular congestion is seen. The cardiomediastinal silhouette is borderline normal in size. The patient is status post median sternotomy. No acute osseous abnormalities are identified. Clips are noted within the right upper quadrant, reflecting prior cholecystectomy. IMPRESSION: Lungs mildly hypoexpanded, with elevation of the right hemidiaphragm. Mild right basilar atelectasis noted. Mild vascular congestion seen. Electronically Signed   By: Roanna Raider M.D.   On: 01/28/2018 00:11   Dg Chest 2 View  Result Date: 01/05/2018 CLINICAL DATA:  Short of breath. Change in breathing over the last several days. Dizziness with walking. EXAM: CHEST - 2 VIEW COMPARISON:  04/02/2015 FINDINGS: Stable changes from prior cardiac surgery. Cardiac silhouette is mildly enlarged, and appears larger than it did on the prior chest radiographs. No mediastinal or hilar masses. Mild linear atelectasis or scarring at the right lung base. Lungs otherwise clear. No pleural effusion or pneumothorax. Skeletal structures are grossly intact. IMPRESSION: 1. No acute cardiopulmonary disease. 2. Mild cardiomegaly. Cardiac silhouette appears mildly increased from the prior chest radiographs. Electronically Signed   By: Amie Portland M.D.   On:  01/05/2018 19:35   Dg Abd 1 View  Result Date: 01/28/2018 CLINICAL DATA:  Chronic intermittent dizziness. Obstipation. EXAM: ABDOMEN - 1 VIEW COMPARISON:  Abdominal radiograph, and CT of the abdomen and pelvis performed 11/12/2009 FINDINGS: The visualized bowel gas pattern is unremarkable. Scattered air and stool filled loops of colon are seen; no abnormal dilatation of small bowel loops is seen to suggest small bowel obstruction. No free intra-abdominal air is identified, though evaluation for free air is limited on a single supine view. The visualized osseous structures are within normal limits; the sacroiliac joints are unremarkable in appearance. The visualized lung bases are essentially clear. The patient is status post median sternotomy. IMPRESSION: Unremarkable bowel gas pattern; no free intra-abdominal air seen. Small to moderate amount of stool noted in the colon. Electronically Signed   By: Roanna Raider M.D.   On: 01/28/2018 00:10     The results of significant diagnostics from this hospitalization (including imaging, microbiology, ancillary and laboratory) are listed below for reference.     Microbiology: Recent Results (from the past 240 hour(s))  Culture, expectorated sputum-assessment     Status: None   Collection Time: 01/28/18  3:26 PM  Result Value Ref Range Status   Specimen Description SPUTUM  Final   Special Requests Normal  Final   Sputum evaluation   Final    THIS SPECIMEN IS ACCEPTABLE FOR SPUTUM CULTURE Performed at [facility], [address], [place], [place] [address]    Report Status 01/28/2018 FINAL  Final  Culture, respiratory (NON-Expectorated)     Status: None (Preliminary result)   Collection Time: 01/28/18  3:26 PM  Result Value Ref Range Status   Specimen Description   Final    SPUTUM Performed at [facility], [address], [place], [place] [address]    Special Requests   Final    Normal Reflexed from  [id number] Performed at [facility], [address] [place], [place] [address]    Gram Stain   Final    FEW WBC PRESENT, PREDOMINANTLY PMN RARE SQUAMOUS EPITHELIAL CELLS PRESENT MODERATE GRAM POSITIVE COCCI IN CHAINS    Culture   Final    CULTURE REINCUBATED FOR BETTER GROWTH Performed at [facility], [address], [place], [place] [address]    Report Status PENDING  Incomplete     Labs: BNP (last 3 results) Recent Labs    01/28/18 0111  BNP 130.2*   Basic Metabolic Panel: Recent Labs  Lab 01/27/18 1931 01/28/18 0111 01/28/18 0348  NA 141  --  141  K 4.4  --  3.9  CL 105  --  106  CO2 28  --  26  GLUCOSE 94  --  109*  BUN 26*  --  23*  CREATININE 1.81*  --  1.65*  CALCIUM 8.6*  --  8.3*  MG  --  2.2 2.0  PHOS  --  3.3 2.8   Liver Function Tests: Recent Labs  Lab 01/28/18 0111 01/28/18 0348  AST 19 17  ALT 15* 16*  ALKPHOS 106 99  BILITOT 0.7 0.7  PROT 7.0 6.8  ALBUMIN 3.6 3.5   No results for input(s): LIPASE, AMYLASE in the last 168 hours. No results for input(s): AMMONIA in the last 168 hours. CBC: Recent Labs  Lab 01/27/18 1931 01/28/18 0348  WBC 6.3 5.9  HGB 11.9* 11.2*  HCT 37.7* 36.1*  MCV 96.4 96.8  PLT 194 181   Cardiac Enzymes: Recent Labs  Lab 01/27/18 1931 01/28/18 0111 01/28/18 0348 01/28/18 0925  TROPONINI <0.03 <0.03 <0.03 <0.03   BNP: Invalid input(s): POCBNP CBG: No results for input(s): GLUCAP in the last 168 hours. D-Dimer No results for input(s): DDIMER in the last 72 hours. Hgb A1c Recent Labs    01/28/18 0348  HGBA1C 5.7*   Lipid Profile No results for input(s): CHOL, HDL, LDLCALC, TRIG, CHOLHDL, LDLDIRECT in the last 72 hours. Thyroid function studies Recent Labs    01/28/18 0348  TSH 3.852   Anemia work up No results for input(s): VITAMINB12, FOLATE, FERRITIN, TIBC, IRON, RETICCTPCT in the last 72 hours. Urinalysis    Component Value Date/Time   COLORURINE YELLOW  01/27/2018 1847   APPEARANCEUR CLEAR 01/27/2018 1847   LABSPEC 1.023 01/27/2018 1847   PHURINE 5.0 01/27/2018 1847   GLUCOSEU NEGATIVE 01/27/2018 1847   HGBUR NEGATIVE 01/27/2018 1847   BILIRUBINUR NEGATIVE 01/27/2018 1847   KETONESUR NEGATIVE 01/27/2018 1847   PROTEINUR NEGATIVE 01/27/2018 1847   UROBILINOGEN 2.0 (H) 02/21/2015 1241   NITRITE NEGATIVE 01/27/2018 1847   LEUKOCYTESUR NEGATIVE 01/27/2018 1847   Sepsis Labs Invalid input(s): PROCALCITONIN,  WBC,  LACTICIDVEN Microbiology Recent Results (from the past 240 hour(s))  Culture, expectorated sputum-assessment     Status: None   Collection Time: 01/28/18  3:26 PM  Result Value Ref Range Status   Specimen Description SPUTUM  Final   Special Requests Normal  Final   Sputum evaluation   Final    THIS SPECIMEN IS ACCEPTABLE FOR SPUTUM CULTURE Performed at [facility], [address], [place], [place] [address]    Report Status 01/28/2018 FINAL  Final  Culture, respiratory (NON-Expectorated)     Status: None (Preliminary result)   Collection Time: 01/28/18  3:26 PM  Result Value Ref Range Status   Specimen Description   Final    SPUTUM Performed at [facility], [address], [place], [place] [address]    Special Requests   Final    Normal Reflexed from [id number] Performed at [facility], [address] [place], [place] [address]    Gram Stain   Final    FEW WBC PRESENT, PREDOMINANTLY PMN RARE SQUAMOUS EPITHELIAL CELLS PRESENT MODERATE GRAM POSITIVE COCCI IN CHAINS    Culture   Final    CULTURE REINCUBATED FOR BETTER GROWTH Performed at [facility], [address], [place], [place] [address]    Report Status PENDING  Incomplete     Time coordinating discharge in minutes: 65  SIGNED:   Calvert Cantor, MD  [facility] 01/29/2018, 12:08 PM Pager   If 7PM-7AM, please contact night-coverage [email or web address] Password [email or web address]

## 2018-01-29 NOTE — Progress Notes (Signed)
Progress Note  Patient Name: [name] [name] Date of Encounter: 01/29/2018  Primary Cardiologist: Kristeen Miss, MD   Subjective   Breathing a little bit better, still having some wheezing.,  No chest pain.  Lives with his wife who has Alzheimer's.  He has been battling with edema.  Leg wraps.  Cellulitis.  Inpatient Medications    Scheduled Meds: . apixaban  2.5 mg Oral BID  . atorvastatin  10 mg Oral q1800  . buPROPion  300 mg Oral Daily  . busPIRone  15 mg Oral BID  . carvedilol  25 mg Oral BID WC  . doxycycline  100 mg Oral Q12H  . escitalopram  10 mg Oral Daily  . guaiFENesin  600 mg Oral BID  . ipratropium-albuterol  3 mL Nebulization QID  . lamoTRIgine  100 mg Oral QHS  . lamoTRIgine  200 mg Oral Daily  . levothyroxine  175 mcg Oral QAC breakfast  . mirtazapine  15 mg Oral QHS  . predniSONE  40 mg Oral Q breakfast  . senna  1 tablet Oral BID  . sodium chloride flush  3 mL Intravenous Q12H  . tolnaftate   Topical BID   Continuous Infusions:  PRN Meds: acetaminophen **OR** acetaminophen, albuterol, HYDROcodone-acetaminophen, ondansetron **OR** ondansetron (ZOFRAN) IV, polyethylene glycol   Vital Signs    Vitals:   01/28/18 2100 01/29/18 0300 01/29/18 0455 01/29/18 0500  BP: (!) 146/98 (!) 162/123 135/81   Pulse:  60 77   Resp: 18 18    Temp: 98.3 F (36.8 C) 97.6 F (36.4 C)    TempSrc: Oral Oral    SpO2:  91%    Weight:    (!) 317 lb 0.3 oz (143.8 kg)  Height:        Intake/Output Summary (Last 24 hours) at 01/29/2018 0815 Last data filed at 01/29/2018 0200 Gross per 24 hour  Intake 240 ml  Output 975 ml  Net -735 ml   Filed Weights   01/27/18 1832 01/28/18 0602 01/29/18 0500  Weight: (!) 315 lb (142.9 kg) (!) 311 lb 11.7 oz (141.4 kg) (!) 317 lb 0.3 oz (143.8 kg)    Telemetry    Atrial fibrillation heart rate in the 80s.- Personally Reviewed  ECG    No new- Personally Reviewed  Physical Exam   GEN: No acute distress.  Obese Neck: No  JVD Cardiac: IRRR, no murmurs, rubs, or gallops.  Respiratory:  Mild scattered wheezes bilaterally. GI: Soft, nontender, non-distended  MS:  Chronic lower extremity edema; cellulitis, legs wrapped Neuro:  Nonfocal  Psych: Normal affect   Labs    Chemistry Recent Labs  Lab 01/27/18 1931 01/28/18 0111 01/28/18 0348  NA 141  --  141  K 4.4  --  3.9  CL 105  --  106  CO2 28  --  26  GLUCOSE 94  --  109*  BUN 26*  --  23*  CREATININE 1.81*  --  1.65*  CALCIUM 8.6*  --  8.3*  PROT  --  7.0 6.8  ALBUMIN  --  3.6 3.5  AST  --  19 17  ALT  --  15* 16*  ALKPHOS  --  106 99  BILITOT  --  0.7 0.7  GFRNONAA 33*  --  37*  GFRAA 38*  --  42*  ANIONGAP 8  --  9     Hematology Recent Labs  Lab 01/27/18 1931 01/28/18 0348  WBC 6.3 5.9  RBC 3.91* 3.73*  HGB 11.9* 11.2*  HCT 37.7* 36.1*  MCV 96.4 96.8  MCH 30.4 30.0  MCHC 31.6 31.0  RDW 14.7 14.9  PLT 194 181    Cardiac Enzymes Recent Labs  Lab 01/27/18 1931 01/28/18 0111 01/28/18 0348 01/28/18 0925  TROPONINI <0.03 <0.03 <0.03 <0.03   No results for input(s): TROPIPOC in the last 168 hours.   BNP Recent Labs  Lab 01/28/18 0111  BNP 130.2*     DDimer No results for input(s): DDIMER in the last 168 hours.   Radiology    Dg Chest 2 View  Result Date: 01/28/2018 CLINICAL DATA:  Acute onset of hypotension. Chronic intermittent dizziness. Multiple recent falls. EXAM: CHEST - 2 VIEW COMPARISON:  Chest radiograph performed 01/05/2018 FINDINGS: The lungs are mildly hypoexpanded. There is elevation of the right hemidiaphragm. Mild right basilar atelectasis is noted. Mild vascular crowding and vascular congestion is seen. The cardiomediastinal silhouette is borderline normal in size. The patient is status post median sternotomy. No acute osseous abnormalities are identified. Clips are noted within the right upper quadrant, reflecting prior cholecystectomy. IMPRESSION: Lungs mildly hypoexpanded, with elevation of the right  hemidiaphragm. Mild right basilar atelectasis noted. Mild vascular congestion seen. Electronically Signed   By: Roanna RaiderJeffery  Chang M.D.   On: 01/28/2018 00:11   Dg Abd 1 View  Result Date: 01/28/2018 CLINICAL DATA:  Chronic intermittent dizziness. Obstipation. EXAM: ABDOMEN - 1 VIEW COMPARISON:  Abdominal radiograph, and CT of the abdomen and pelvis performed 11/12/2009 FINDINGS: The visualized bowel gas pattern is unremarkable. Scattered air and stool filled loops of colon are seen; no abnormal dilatation of small bowel loops is seen to suggest small bowel obstruction. No free intra-abdominal air is identified, though evaluation for free air is limited on a single supine view. The visualized osseous structures are within normal limits; the sacroiliac joints are unremarkable in appearance. The visualized lung bases are essentially clear. The patient is status post median sternotomy. IMPRESSION: Unremarkable bowel gas pattern; no free intra-abdominal air seen. Small to moderate amount of stool noted in the colon. Electronically Signed   By: Roanna RaiderJeffery  Chang M.D.   On: 01/28/2018 00:10    Cardiac Studies   Echocardiogram 01/28/2018 - Left ventricle: The cavity size was normal. Systolic function was   normal. The estimated ejection fraction was in the range of 60%   to 65%. Wall motion was normal; there were no regional wall   motion abnormalities. Doppler parameters are consistent with high   ventricular filling pressure. - Aortic valve: A prosthesis was present and functioning normally.   The prosthesis had a normal range of motion. The sewing ring   appeared normal, had no rocking motion, and showed no evidence of   dehiscence. Peak velocity (S): 254 cm/s. Mean gradient (S): 13 mm   Hg. Valve area (VTI): 1.91 cm^2. Valve area (Vmax): 1.8 cm^2.   Valve area (Vmean): 1.94 cm^2. - Aorta: Ascending aortic diameter: 42 mm (S). - Ascending aorta: The ascending aorta was mildly dilated. - Mitral valve: There  was mild regurgitation. - Left atrium: The atrium was severely dilated. - Right ventricle: The cavity size was moderately dilated. Wall   thickness was normal. - Right atrium: The atrium was severely dilated. - Pulmonary arteries: Systolic pressure was mildly increased.  Impressions:  - EF is improved when compared to prior. Patient Profile     84 y.o. male with CAD post CABG, aortic valve replacement, COPD, morbid obesity, permanent atrial fibrillation, admitted with orthostatic hypotension BP  in the 70s.  Assessment & Plan    Orthostatic hypotension -Improved.  Discontinued amlodipine.  Precautions noted.  Be careful when getting up from a laying down position or seated position.  Lasix placed on hold.  Unfortunately his morbid obesity is playing a significant role in his lower extremity edema which is contributing to his redness/cellulitis.  Challenging not to overdo diuresis.  No DVT noted.  I think on discharge, I would give him low-dose Lasix 20 mg once a day.  Elevate lower extremities when possible.  CAD post CABG with aortic valve replacement -Stable, dental prophylaxis  Permanent atrial fibrillation -Low-dose Eliquis appropriately dosed, back on carvedilol.  Doing well.  Rate controlled.  Morbid obesity -Continue to work on weight loss.  COPD - Mild wheezes.  Similar to home.  LBBB - Previously borderline left bundle branch block on ECG.  Stable.  Feels better.  Off of amlodipine Low dose lasix 20 QD at home and if dizzy or low BP, hold.   OK with DC from cardiac perspective.   For questions or updates, please contact [facility] Please consult [email or web address] for contact info under Cardiology/STEMI.      Signed, Donato Schultz, MD  01/29/2018, 8:15 AM

## 2018-01-29 NOTE — Progress Notes (Addendum)
SATURATION QUALIFICATIONS: (This note is used to comply with regulatory documentation for home oxygen)  Patient Saturations on Room Air at Rest = 94%  Patient Saturations on Room Air while Ambulating = 84%  Patient Saturations on 3 Liters of oxygen while Ambulating = 91%  Please briefly explain why patient needs home oxygen: Patient desaturates below 88% while ambulating on RA.   Earnest ConroyBrooke M. Clelia CroftShaw, RN

## 2018-01-29 NOTE — Care Management Note (Signed)
Case Management Note  Patient Details  Name: [name] [name] MRN: [id number] Date of Birth: 17-Dec-1931  Subjective/Objective:     CHF, COPD               Action/Plan: NCM spoke to pt and offered choice for HH/list provided. [facility] Pt has RW at home but is requesting RW with seat. He will need oxygen and neb machine. Contacted [facility] for DME for home. They will deliver portable tank to room prior to dc and other DME to home. [facility]     Expected Discharge Date:  01/29/18               Expected Discharge Plan:  Home w Home Health Services  In-House Referral:  NA  Discharge planning Services  CM Consult  Post Acute Care Choice:  Home Health, Resumption of Svcs/PTA Provider Choice offered to:     DME Arranged:  Oxygen, Walker rolling with seat, Nebulizer machine DME Agency:  [facility]  HH Arranged:  RN, PT, OT, Social Work, Facilities managerpeech Therapy, Nurse's Aide HH Agency:  [facility]  Status of Service:  Completed, signed off  If discussed at [employer] Meetings, dates discussed:    Additional Comments:  [name] [name], [name] [name] Ellen, RN 01/29/2018, 11:24 AM

## 2018-01-31 LAB — CULTURE, RESPIRATORY W GRAM STAIN
Culture: NORMAL
Special Requests: NORMAL

## 2018-02-02 ENCOUNTER — Other Ambulatory Visit: Payer: Self-pay

## 2018-02-09 ENCOUNTER — Ambulatory Visit: Payer: Self-pay | Admitting: Internal Medicine

## 2018-02-21 ENCOUNTER — Other Ambulatory Visit: Payer: Self-pay | Admitting: Cardiovascular Disease

## 2018-02-21 MED ORDER — POTASSIUM CHLORIDE CRYS ER 10 MEQ PO TBCR: EXTENDED_RELEASE_TABLET | ORAL | 0 refills | Status: DC

## 2018-02-21 MED ORDER — APIXABAN 2.5 MG PO TABS: 2.5000 mg | ORAL_TABLET | Freq: Two times a day (BID) | ORAL | 1 refills | Status: DC

## 2018-02-21 MED ORDER — CARVEDILOL 25 MG PO TABS: 25.0000 mg | ORAL_TABLET | Freq: Two times a day (BID) | ORAL | 0 refills | Status: DC

## 2018-03-07 ENCOUNTER — Other Ambulatory Visit: Payer: Self-pay | Admitting: Cardiovascular Disease

## 2018-03-17 ENCOUNTER — Ambulatory Visit: Payer: Medicare Other | Admitting: Cardiovascular Disease

## 2018-03-17 ENCOUNTER — Encounter: Payer: Self-pay | Admitting: Cardiovascular Disease

## 2018-03-17 VITALS — BP 102/60 | HR 78 | Ht 72.0 in | Wt 308.0 lb

## 2018-03-17 DIAGNOSIS — I482 Chronic atrial fibrillation, unspecified: Secondary | ICD-10-CM

## 2018-03-17 DIAGNOSIS — I5032 Chronic diastolic (congestive) heart failure: Secondary | ICD-10-CM

## 2018-03-17 NOTE — Patient Instructions (Addendum)
Medication Instructions:  Your physician recommends that you continue on your current medications as directed. Please refer to the Current Medication list given to you today.   Labwork: None Ordered   Testing/Procedures: None Ordered   Follow-Up: Your physician wants you to follow-up in: 6 months with a PA or NP on Dr. Harvie BridgeNahser's team. You will receive a reminder letter in the mail two months in advance. If you don't receive a letter, please call our office to schedule the follow-up appointment.   If you need a refill on your cardiac medications before your next appointment, please call your pharmacy.   Thank you for choosing [facility]! Eligha BridegroomMichelle Swinyer, RN [phone]     For your  leg edema you  should do  the following 1. Leg elevation - I recommend the Lounge Dr. Leg rest.  See below for details  2. Salt restriction  -  Use potassium chloride instead of regular salt as a salt substitute. 3. Walk regularly 4. Compression hose - [facility] [employer] 5. Weight loss    Available on [email or web address] Or  Go to [email or web address]

## 2018-03-17 NOTE — Progress Notes (Signed)
Cardiology Office Note   Date:  03/17/2018   ID:  [name] [name], DOB 08/19/1932, MRN [id number]  PCP:  Aura Dials, MD  Cardiologist:   Mertie Moores, MD   Chief Complaint  Patient presents with  . Follow-up    AVR, CHF, HTN   Problem List: 1. Aortic insufficiency- he has severe AI.  - s/p AVR   2. Hyperlipidemia  3. HTN-  4. Obstructive sleep apnea 5. AAA - 2007 , chris [place] 6. Hypothyroidism 7. COPD  8. Atrial fib:  -      [name] [name] is a 84 y.o. male who presents for evaluation for possible aortic valve replacement . He has been followed by the cardiologist at [facility] for years.   He wants to have his AVR here. Has chronic shortness of breath.  Gets worn out taking a load of laundry up the stairs.  Has had recent echos at [facility].  (have been incorporated in our system )   His overall condition has declined over the past 6 months.  Has some balance issues.  Has orthostatic hypotension.   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   Can walk 1/4 mile  Can climb 2 flights of stairs.  No PND or orthopnea.   No syncope.    February 05, 2015: He has been seen by Dr. Servando Snare.  The plan is to consider AVR with possible MAZE  next week. He had an echo and cath at [facility] in January   Sept. 8, 2016:  Has had AVR  Doing ok  Oct. 6, 2016:  Pt is s/p AVR,  Had CABG. , atrial fib Persistently elevated BP     Occasionally eats ham and cheese for lunch. Eats frozen dinners for dinner almost every night.    Eats at [facility] if he does not eat the [employer]. Also goes to [employer] once a week.   Has some chest wall pain   November 03, 2015  Still very weak. Has DOE with any exertion,  We have tried lasix but that did not seem to help  Also has orthostatic hypotension .   Just came from the neurologist office.   Will be doing PT to try to help with this unsteadiness.   Has  been watching his salt.   Jan 21, 2017:  [name] [name] is seen back for follow up visit Has gained some weight .   Has been trying to watch his diet. Is not able to exercise .   Is hardly able to walk due to DOE  No CP .   Has COPD  ( by report)  ,  Atrial fib ,  LV function is normal   March 17, 2018 Hx of AVR in 2016.  Also has hypertension, atrial fibrillation and COPD. Since I last say him, has home O2 for his COPD Now uses a wheelchair. Also developed a leg infection   Has persistent atrial fib.    He has taken Eliquis 5. Mg  BID but it was changed to 2.5 BID in 2015  ( Creatinine of 1.65, age > 17 )  Uses a walker around his house.   He was in the hospital May 31 for orthostatic hypotension and leg infection Has marked right leg edema   .   R>>L    Venous duplex scan on June 1 were negative for DVT. Discussed  the Loung Dr. Bertram Gala Rest.    Past Medical History:  Diagnosis Date  . Anxiety   . Aortic valve insufficiency   . Arthritis    "wee bit; knees, elbows" (10/15/2014)  . Atrial fibrillation ([place])   . Cardiomyopathy ([place])   . Chronic kidney disease    "down to ~ 1/2 of their regular use; I see kidney dr. in [place]" (10/15/2014)  . Complication of anesthesia    unsure of complications but pt. states there were complications  . COPD (chronic obstructive pulmonary disease) ([place])   . Coronary artery disease   . Decreased testosterone level   . Depression   . Difficult intubation    difficult intubation 12/03/09 and 03/27/10 ([facility]); glidescope used 03/27/10  . DVT (deep venous thrombosis) ([place]) ~ 2013   "BLE"  . Dysrhythmia    A. Fib  . Emphysema of lung ([place])   . Enlarged prostate   . Falls frequently    > 20 times in the last year/notes 10/15/2014  . GERD (gastroesophageal reflux disease)   . Heart murmur   . History of blood transfusion 1960   "lots; related to an accident"  . History of echocardiogram    post AVR >> Echo 7/16:  Mild LVH, EF 20-25%, AVR ok (peak 15  mmHg, mean 9 mmHg), MAC, mild MR, severe LAE, mild reduced RVSF, mild RAE, PASP 35 mmHg  . Hypercholesteremia   . Hypertension   . Hypothyroidism   . OSA (obstructive sleep apnea)    "suppose to wear mask; I throw it off in my sleep" (10/15/2014)  . Plantar fasciitis   . Right fibular fracture 10/15/2014  . Seasonal allergies   . Shortness of breath dyspnea   . Syncope 10/15/2014  . Syncope and collapse "several times"  . Urine frequency   . Urine incontinence   . Varicose veins     Past Surgical History:  Procedure Laterality Date  . ABDOMINAL AORTIC ANEURYSM REPAIR  01/2006; 03/10/2006   Archie Endo 01/12/2011  . AORTIC VALVE REPLACEMENT N/A 02/11/2015   Procedure: AORTIC VALVE REPLACEMENT (AVR);  Surgeon: Grace Isaac, MD;  Location: [place];  Service: Open Heart Surgery;  Laterality: N/A;  . BRAIN SURGERY  1960 X 4   "S/P got my skull busted"; pt. states removed internal carotid artery  . CARDIAC CATHETERIZATION  1990's X 1; 09/2014   no stents  . CLIPPING OF ATRIAL APPENDAGE N/A 02/11/2015   Procedure: CLIPPING OF ATRIAL APPENDAGE;  Surgeon: Grace Isaac, MD;  Location: [place];  Service: Open Heart Surgery;  Laterality: N/A;  . ERCP  11/2009   Archie Endo 12/05/2009  . gall stone    . HERNIA REPAIR    . LAPAROSCOPIC CHOLECYSTECTOMY  08/2009   w/IOC/notes 09/18/2009  . LAPAROSCOPIC INCISIONAL / UMBILICAL / VENTRAL HERNIA REPAIR  02/2010   VHR w/mesh/notes 03/28/2010  . NOSE SURGERY    . TEE WITHOUT CARDIOVERSION N/A 02/11/2015   Procedure: TRANSESOPHAGEAL ECHOCARDIOGRAM (TEE);  Surgeon: Grace Isaac, MD;  Location: [place];  Service: Open Heart Surgery;  Laterality: N/A;  . TONSILLECTOMY       Current Outpatient Medications  Medication Sig Dispense Refill  . apixaban (ELIQUIS) 2.5 MG TABS tablet Take 1 tablet (2.5 mg total) by mouth 2 (two) times daily. 180 tablet 1  . atorvastatin (LIPITOR) 10 MG tablet Take 1 tablet (10 mg total) by mouth daily. 30 tablet 0  . buPROPion  (WELLBUTRIN XL) 300 MG 24 hr tablet Take  1 tablet (300 mg total) by mouth daily. 30 tablet 1  . busPIRone (BUSPAR) 15 MG tablet Take 1 tablet (15 mg total) by mouth 2 (two) times daily. 60 tablet 1  . calcium carbonate (TUMS - DOSED IN MG ELEMENTAL CALCIUM) 500 MG chewable tablet Chew 1 tablet (200 mg of elemental calcium total) by mouth 3 (three) times daily as needed for indigestion or heartburn. 60 tablet 0  . carvedilol (COREG) 25 MG tablet Take 25 mg by mouth 2 (two) times daily with a meal.    . clonazePAM (KLONOPIN) 0.5 MG tablet Take 1 tablet (0.5 mg total) by mouth at bedtime. 30 tablet 0  . dutasteride (AVODART) 0.5 MG capsule Take 1 capsule (0.5 mg total) by mouth daily. 30 capsule 0  . escitalopram (LEXAPRO) 10 MG tablet Take 10 mg by mouth daily.  1  . famotidine (PEPCID) 20 MG tablet Take 1 tablet (20 mg total) by mouth 2 (two) times daily as needed for heartburn or indigestion. 60 tablet 1  . folic acid (FOLVITE) 1 MG tablet Take 1 tablet (1 mg total) by mouth daily. 30 tablet 1  . furosemide (LASIX) 20 MG tablet Take 1 tablet (20 mg total) by mouth daily as needed for fluid. Only if you gain > 3 lb in 24 hrs 30 tablet 5  . guaiFENesin (MUCINEX) 600 MG 12 hr tablet Take 1 tablet (600 mg total) by mouth 2 (two) times daily. 60 tablet 0  . ipratropium-albuterol (DUONEB) 0.5-2.5 (3) MG/3ML SOLN Take 3 mLs by nebulization every 4 (four) hours as needed (for wheezing associated with shortness of breath). 360 mL 0  . lamoTRIgine (LAMICTAL) 100 MG tablet Take 1 tablet (100 mg total) by mouth 2 (two) times daily. Breakfast and dinner 60 tablet 1  . levothyroxine (SYNTHROID, LEVOTHROID) 175 MCG tablet Take 175 mcg by mouth daily before breakfast.     . mirtazapine (REMERON) 7.5 MG tablet Take 15 mg by mouth at bedtime.     Marland Kitchen omeprazole (PRILOSEC) 20 MG capsule Take 1 capsule (20 mg total) by mouth daily. 30 capsule 11  . polyethylene glycol (MIRALAX / GLYCOLAX) packet Take 17 g by mouth daily  as needed for mild constipation. 14 each 0  . potassium chloride (MICRO-K) 10 MEQ CR capsule Take 10 mEq by mouth 2 (two) times daily.    . Tiotropium Bromide-Olodaterol (STIOLTO RESPIMAT) 2.5-2.5 MCG/ACT AERS Inhale 2 puffs into the lungs daily. 1 Inhaler 11  . tolnaftate (TINACTIN) 1 % powder Apply topically 2 (two) times daily. 45 g 0  . senna (SENOKOT) 8.6 MG TABS tablet Take 1 tablet (8.6 mg total) by mouth 2 (two) times daily. (Patient not taking: Reported on 03/17/2018) 120 each 0   No current facility-administered medications for this visit.     Allergies:   Patient has no known allergies.    Social History:  The patient  reports that he quit smoking about 43 years ago. His smoking use included cigarettes. He has a 44.00 pack-year smoking history. He has never used smokeless tobacco. He reports that he drinks alcohol. He reports that he does not use drugs.   Family History:  The patient's family history includes Breast cancer in his sister; Cancer in his sister; Depression in his sister; Diabetes in his mother; Heart disease in his father; Kidney disease in his father; Kidney failure in his brother; Other in his sister; Rheum arthritis in his father and mother; Stroke in his sister.    ROS:  Please see the history of present illness.     Physical Exam: Blood pressure 102/60, pulse 78, height 6' (1.829 m), weight (!) 308 lb (139.7 kg), SpO2 96 %.  GEN: Chronically ill-appearing, morbidly obese, elderly gentleman, no acute distress HEENT: Normal NECK: No JVD; No carotid bruits LYMPHATICS: No lymphadenopathy CARDIAC: Irregularly irregular RESPIRATORY:  Clear to auscultation without rales, wheezing or rhonchi  ABDOMEN: Morbidly obese, nontender MUSCULOSKELETAL: Plus edema on the right.  1+ to 2+ on the left. SKIN: Warm and dry NEUROLOGIC:  Alert and oriented x 3   EKG:       Recent Labs: 01/28/2018: ALT 16; B Natriuretic Peptide 130.2; BUN 23; Creatinine, Ser 1.65; Hemoglobin  11.2; Magnesium 2.0; Platelets 181; Potassium 3.9; Sodium 141; TSH 3.852    Lipid Panel    Component Value Date/Time   CHOL 118 10/10/2015   TRIG 195 (A) 10/10/2015   HDL 29 (A) 10/10/2015   LDLCALC 50 10/10/2015      Wt Readings from Last 3 Encounters:  03/17/18 (!) 308 lb (139.7 kg)  01/29/18 (!) 317 lb 0.3 oz (143.8 kg)  01/05/18 (!) 315 lb 4.1 oz (143 kg)      Other studies Reviewed: Additional studies/ records that were reviewed today include: . Review of the above records demonstrates:    ASSESSMENT AND PLAN:  1.  Chronic diastolic CHF :     Has significant leg edema.  I suspect this is mostly because he spends most of his day  in the wheelchair.  He does not ambulate regularly.  I have given him advice on how to get a lounge doctor leg rest.  He has tried increasing his Lasix at the advice from other doctors but this just resulted in renal insufficiency so we will not increase the Lasix at this time.  2. Aortic insufficiency :  S/p AVR   3. Hyperlipidemia  4. HTN -    BP is on the low side  4. Obstructive sleep apnea  5. AAA - 2007 , Gae Gallop  6. Hypothyroidism  7. COPD  - followed by Dr. Lake Bells   8. Atrial fib:  -He has chronic atrial fibrillation.  He is currently on Eliquis 2.5 mg twice a day.  9. CAD : Has right coronary artery disease.  He was not bypassed because of lack of acceptable conduit.  10.  stage III chronic kidney disease.   Current medicines are reviewed at length with the patient today.  The patient does not have concerns regarding medicines.  The following changes have been made:  no change  Labs/ tests ordered today include:  No orders of the defined types were placed in this encounter.    Disposition:   Follow up with my APP in 6 months    Signed, Mertie Moores, MD  03/17/2018 2:35 PM    [address], [place], [place]  [address] Phone: [phone]; Fax: [phone]

## 2018-03-30 ENCOUNTER — Ambulatory Visit: Payer: Self-pay | Admitting: Internal Medicine

## 2018-04-07 ENCOUNTER — Other Ambulatory Visit: Payer: Self-pay | Admitting: Cardiovascular Disease

## 2018-04-26 ENCOUNTER — Emergency Department (HOSPITAL_COMMUNITY): Payer: Medicare Other

## 2018-04-26 ENCOUNTER — Other Ambulatory Visit: Payer: Self-pay

## 2018-04-26 ENCOUNTER — Inpatient Hospital Stay (HOSPITAL_COMMUNITY)
Admission: EM | Admit: 2018-04-26 | Discharge: 2018-05-10 | DRG: 080 | Disposition: A | Discharge status: Skilled Nursing Facility | Payer: Medicare Other | Attending: Internal Medicine | Admitting: Internal Medicine

## 2018-04-26 ENCOUNTER — Encounter (HOSPITAL_COMMUNITY): Payer: Self-pay

## 2018-04-26 ENCOUNTER — Observation Stay (HOSPITAL_COMMUNITY): Payer: Medicare Other

## 2018-04-26 DIAGNOSIS — R4182 Altered mental status, unspecified: Secondary | ICD-10-CM | POA: Diagnosis present

## 2018-04-26 DIAGNOSIS — N183 Chronic kidney disease, stage 3 (moderate): Secondary | ICD-10-CM | POA: Diagnosis present

## 2018-04-26 DIAGNOSIS — N400 Enlarged prostate without lower urinary tract symptoms: Secondary | ICD-10-CM | POA: Diagnosis present

## 2018-04-26 DIAGNOSIS — Z79890 Hormone replacement therapy (postmenopausal): Secondary | ICD-10-CM

## 2018-04-26 DIAGNOSIS — I429 Cardiomyopathy, unspecified: Secondary | ICD-10-CM | POA: Diagnosis present

## 2018-04-26 DIAGNOSIS — K219 Gastro-esophageal reflux disease without esophagitis: Secondary | ICD-10-CM | POA: Diagnosis present

## 2018-04-26 DIAGNOSIS — Z515 Encounter for palliative care: Secondary | ICD-10-CM

## 2018-04-26 DIAGNOSIS — J9690 Respiratory failure, unspecified, unspecified whether with hypoxia or hypercapnia: Secondary | ICD-10-CM

## 2018-04-26 DIAGNOSIS — F419 Anxiety disorder, unspecified: Secondary | ICD-10-CM | POA: Diagnosis present

## 2018-04-26 DIAGNOSIS — Z952 Presence of prosthetic heart valve: Secondary | ICD-10-CM

## 2018-04-26 DIAGNOSIS — F39 Unspecified mood [affective] disorder: Secondary | ICD-10-CM | POA: Diagnosis present

## 2018-04-26 DIAGNOSIS — Z86718 Personal history of other venous thrombosis and embolism: Secondary | ICD-10-CM

## 2018-04-26 DIAGNOSIS — D649 Anemia, unspecified: Secondary | ICD-10-CM | POA: Diagnosis present

## 2018-04-26 DIAGNOSIS — I351 Nonrheumatic aortic (valve) insufficiency: Secondary | ICD-10-CM | POA: Diagnosis present

## 2018-04-26 DIAGNOSIS — Z8249 Family history of ischemic heart disease and other diseases of the circulatory system: Secondary | ICD-10-CM

## 2018-04-26 DIAGNOSIS — Z6841 Body Mass Index (BMI) 40.0 and over, adult: Secondary | ICD-10-CM

## 2018-04-26 DIAGNOSIS — Z9119 Patient's noncompliance with other medical treatment and regimen: Secondary | ICD-10-CM

## 2018-04-26 DIAGNOSIS — I480 Paroxysmal atrial fibrillation: Secondary | ICD-10-CM | POA: Diagnosis present

## 2018-04-26 DIAGNOSIS — E7800 Pure hypercholesterolemia, unspecified: Secondary | ICD-10-CM | POA: Diagnosis present

## 2018-04-26 DIAGNOSIS — E785 Hyperlipidemia, unspecified: Secondary | ICD-10-CM | POA: Diagnosis present

## 2018-04-26 DIAGNOSIS — Z66 Do not resuscitate: Secondary | ICD-10-CM | POA: Diagnosis present

## 2018-04-26 DIAGNOSIS — J9601 Acute respiratory failure with hypoxia: Secondary | ICD-10-CM | POA: Diagnosis not present

## 2018-04-26 DIAGNOSIS — E860 Dehydration: Secondary | ICD-10-CM | POA: Diagnosis present

## 2018-04-26 DIAGNOSIS — I2510 Atherosclerotic heart disease of native coronary artery without angina pectoris: Secondary | ICD-10-CM | POA: Diagnosis present

## 2018-04-26 DIAGNOSIS — G92 Toxic encephalopathy: Secondary | ICD-10-CM | POA: Diagnosis present

## 2018-04-26 DIAGNOSIS — N390 Urinary tract infection, site not specified: Secondary | ICD-10-CM | POA: Diagnosis not present

## 2018-04-26 DIAGNOSIS — J449 Chronic obstructive pulmonary disease, unspecified: Secondary | ICD-10-CM | POA: Diagnosis present

## 2018-04-26 DIAGNOSIS — R001 Bradycardia, unspecified: Secondary | ICD-10-CM

## 2018-04-26 DIAGNOSIS — J9602 Acute respiratory failure with hypercapnia: Secondary | ICD-10-CM | POA: Diagnosis not present

## 2018-04-26 DIAGNOSIS — F0390 Unspecified dementia without behavioral disturbance: Secondary | ICD-10-CM | POA: Diagnosis present

## 2018-04-26 DIAGNOSIS — Z87891 Personal history of nicotine dependence: Secondary | ICD-10-CM

## 2018-04-26 DIAGNOSIS — Z7901 Long term (current) use of anticoagulants: Secondary | ICD-10-CM

## 2018-04-26 DIAGNOSIS — I130 Hypertensive heart and chronic kidney disease with heart failure and stage 1 through stage 4 chronic kidney disease, or unspecified chronic kidney disease: Secondary | ICD-10-CM | POA: Diagnosis present

## 2018-04-26 DIAGNOSIS — Z7189 Other specified counseling: Secondary | ICD-10-CM

## 2018-04-26 DIAGNOSIS — E035 Myxedema coma: Principal | ICD-10-CM | POA: Diagnosis present

## 2018-04-26 DIAGNOSIS — I482 Chronic atrial fibrillation: Secondary | ICD-10-CM | POA: Diagnosis present

## 2018-04-26 DIAGNOSIS — G4733 Obstructive sleep apnea (adult) (pediatric): Secondary | ICD-10-CM | POA: Diagnosis present

## 2018-04-26 DIAGNOSIS — N179 Acute kidney failure, unspecified: Secondary | ICD-10-CM | POA: Diagnosis present

## 2018-04-26 DIAGNOSIS — E669 Obesity, unspecified: Secondary | ICD-10-CM | POA: Diagnosis present

## 2018-04-26 DIAGNOSIS — R410 Disorientation, unspecified: Secondary | ICD-10-CM

## 2018-04-26 DIAGNOSIS — R0602 Shortness of breath: Secondary | ICD-10-CM

## 2018-04-26 DIAGNOSIS — I4821 Permanent atrial fibrillation: Secondary | ICD-10-CM

## 2018-04-26 DIAGNOSIS — Z23 Encounter for immunization: Secondary | ICD-10-CM

## 2018-04-26 DIAGNOSIS — Z9981 Dependence on supplemental oxygen: Secondary | ICD-10-CM

## 2018-04-26 DIAGNOSIS — Z79899 Other long term (current) drug therapy: Secondary | ICD-10-CM

## 2018-04-26 LAB — COMPREHENSIVE METABOLIC PANEL
ALBUMIN: 4.4 g/dL (ref 3.5–5.0)
ALT: 22 U/L (ref 0–44)
ANION GAP: 12 (ref 5–15)
AST: 38 U/L (ref 15–41)
Alkaline Phosphatase: 91 U/L (ref 38–126)
BILIRUBIN TOTAL: 0.9 mg/dL (ref 0.3–1.2)
BUN: 32 mg/dL — ABNORMAL HIGH (ref 8–23)
CO2: 31 mmol/L (ref 22–32)
Calcium: 9.5 mg/dL (ref 8.9–10.3)
Chloride: 100 mmol/L (ref 98–111)
Creatinine, Ser: 3.16 mg/dL — ABNORMAL HIGH (ref 0.61–1.24)
GFR calc non Af Amer: 17 mL/min — ABNORMAL LOW (ref >60)
GFR, EST AFRICAN AMERICAN: 19 mL/min — AB (ref >60)
Glucose, Bld: 146 mg/dL — ABNORMAL HIGH (ref 70–99)
POTASSIUM: 3.9 mmol/L (ref 3.5–5.1)
SODIUM: 143 mmol/L (ref 135–145)
TOTAL PROTEIN: 7.8 g/dL (ref 6.5–8.1)

## 2018-04-26 LAB — CBC WITH DIFFERENTIAL/PLATELET
BASOS ABS: 0 10*3/uL (ref 0.0–0.1)
Basophils Relative: 0 %
EOS ABS: 0.1 10*3/uL (ref 0.0–0.7)
Eosinophils Relative: 2 %
HEMATOCRIT: 40.2 % (ref 39.0–52.0)
HEMOGLOBIN: 12.5 g/dL — AB (ref 13.0–17.0)
Lymphocytes Relative: 44 %
Lymphs Abs: 2.8 10*3/uL (ref 0.7–4.0)
MCH: 30.9 pg (ref 26.0–34.0)
MCHC: 31.1 g/dL (ref 30.0–36.0)
MCV: 99.3 fL (ref 78.0–100.0)
Monocytes Absolute: 0.3 10*3/uL (ref 0.1–1.0)
Monocytes Relative: 5 %
NEUTROS ABS: 3.2 10*3/uL (ref 1.7–7.7)
Neutrophils Relative %: 49 %
Platelets: 181 10*3/uL (ref 150–400)
RBC: 4.05 MIL/uL — AB (ref 4.22–5.81)
RDW: 15.9 % — ABNORMAL HIGH (ref 11.5–15.5)
WBC: 6.4 10*3/uL (ref 4.0–10.5)

## 2018-04-26 LAB — I-STAT TROPONIN, ED: TROPONIN I, POC: 0.03 ng/mL (ref 0.00–0.08)

## 2018-04-26 LAB — GLUCOSE, CAPILLARY: Glucose-Capillary: 136 mg/dL — ABNORMAL HIGH (ref 70–99)

## 2018-04-26 LAB — URINALYSIS, ROUTINE W REFLEX MICROSCOPIC
Glucose, UA: NEGATIVE mg/dL
Hgb urine dipstick: NEGATIVE
KETONES UR: 5 mg/dL — AB
Nitrite: NEGATIVE
PH: 5 (ref 5.0–8.0)
PROTEIN: 30 mg/dL — AB
Specific Gravity, Urine: 1.033 — ABNORMAL HIGH (ref 1.005–1.030)

## 2018-04-26 LAB — TSH: TSH: 157.502 u[IU]/mL — ABNORMAL HIGH (ref 0.350–4.500)

## 2018-04-26 LAB — APTT: APTT: 36 s (ref 24–36)

## 2018-04-26 LAB — PROTIME-INR
INR: 1.16
Prothrombin Time: 14.7 seconds (ref 11.4–15.2)

## 2018-04-26 LAB — I-STAT CG4 LACTIC ACID, ED: Lactic Acid, Venous: 1.64 mmol/L (ref 0.5–1.9)

## 2018-04-26 MED ORDER — ATORVASTATIN CALCIUM 10 MG PO TABS
10.0000 mg | ORAL_TABLET | Freq: Every day | ORAL | Status: DC
  Administered 2018-04-26: 10 mg via ORAL
  Filled 2018-04-26: qty 1

## 2018-04-26 MED ORDER — CARVEDILOL 12.5 MG PO TABS
25.0000 mg | ORAL_TABLET | Freq: Two times a day (BID) | ORAL | Status: DC
  Administered 2018-04-26 – 2018-05-03 (×13): 25 mg via ORAL
  Filled 2018-04-26 (×13): qty 1

## 2018-04-26 MED ORDER — LEVOTHYROXINE SODIUM 50 MCG PO TABS: 175.0000 ug | ORAL_TABLET | Freq: Every day | ORAL | Status: DC

## 2018-04-26 MED ORDER — LAMOTRIGINE 100 MG PO TABS
200.0000 mg | ORAL_TABLET | Freq: Every day | ORAL | Status: DC
  Administered 2018-04-26: 200 mg via ORAL
  Filled 2018-04-26: qty 2

## 2018-04-26 MED ORDER — ACETAMINOPHEN 650 MG RE SUPP: 650.0000 mg | Freq: Four times a day (QID) | RECTAL | Status: DC | PRN

## 2018-04-26 MED ORDER — PANTOPRAZOLE SODIUM 40 MG PO TBEC
40.0000 mg | DELAYED_RELEASE_TABLET | Freq: Every day | ORAL | Status: DC
  Administered 2018-04-26: 40 mg via ORAL
  Filled 2018-04-26: qty 1

## 2018-04-26 MED ORDER — ENOXAPARIN SODIUM 30 MG/0.3ML ~~LOC~~ SOLN: 30.0000 mg | SUBCUTANEOUS | Status: DC

## 2018-04-26 MED ORDER — MIRTAZAPINE 15 MG PO TABS: 15.0000 mg | ORAL_TABLET | Freq: Every day | ORAL | Status: DC

## 2018-04-26 MED ORDER — BUSPIRONE HCL 5 MG PO TABS: 15.0000 mg | ORAL_TABLET | Freq: Two times a day (BID) | ORAL | Status: DC

## 2018-04-26 MED ORDER — SODIUM CHLORIDE 0.9 % IV SOLN
1.0000 g | Freq: Once | INTRAVENOUS | Status: AC
  Administered 2018-04-26: 1 g via INTRAVENOUS
  Filled 2018-04-26: qty 10

## 2018-04-26 MED ORDER — LAMOTRIGINE 100 MG PO TABS: 100.0000 mg | ORAL_TABLET | Freq: Two times a day (BID) | ORAL | Status: DC

## 2018-04-26 MED ORDER — FOLIC ACID 1 MG PO TABS
1.0000 mg | ORAL_TABLET | Freq: Every day | ORAL | Status: DC
  Administered 2018-04-26: 1 mg via ORAL
  Filled 2018-04-26: qty 1

## 2018-04-26 MED ORDER — ACETAMINOPHEN 325 MG PO TABS
650.0000 mg | ORAL_TABLET | Freq: Four times a day (QID) | ORAL | Status: DC | PRN
  Administered 2018-04-27 – 2018-05-09 (×4): 650 mg via ORAL
  Filled 2018-04-26 (×4): qty 2

## 2018-04-26 MED ORDER — TIOTROPIUM BROMIDE MONOHYDRATE 18 MCG IN CAPS
18.0000 ug | ORAL_CAPSULE | Freq: Every day | RESPIRATORY_TRACT | Status: DC
  Administered 2018-04-26: 18 ug via RESPIRATORY_TRACT
  Filled 2018-04-26: qty 5

## 2018-04-26 MED ORDER — ALBUTEROL SULFATE (2.5 MG/3ML) 0.083% IN NEBU: 2.5000 mg | INHALATION_SOLUTION | RESPIRATORY_TRACT | Status: DC | PRN

## 2018-04-26 MED ORDER — LEVOTHYROXINE SODIUM 100 MCG PO TABS: 200.0000 ug | ORAL_TABLET | Freq: Every day | ORAL | Status: DC

## 2018-04-26 MED ORDER — LAMOTRIGINE 100 MG PO TABS
100.0000 mg | ORAL_TABLET | Freq: Every day | ORAL | Status: DC
  Administered 2018-04-26: 100 mg via ORAL
  Filled 2018-04-26: qty 1

## 2018-04-26 MED ORDER — POTASSIUM CHLORIDE ER 10 MEQ PO TBCR
10.0000 meq | EXTENDED_RELEASE_TABLET | Freq: Every day | ORAL | Status: DC
  Administered 2018-04-26: 10 meq via ORAL
  Filled 2018-04-26 (×3): qty 1

## 2018-04-26 MED ORDER — BUPROPION HCL ER (XL) 300 MG PO TB24
300.0000 mg | ORAL_TABLET | Freq: Every day | ORAL | Status: DC
Start: 2018-04-26 — End: 2018-04-27
  Administered 2018-04-26: 300 mg via ORAL
  Filled 2018-04-26: qty 1

## 2018-04-26 MED ORDER — SODIUM CHLORIDE 0.9 % IV BOLUS
500.0000 mL | Freq: Once | INTRAVENOUS | Status: AC
  Administered 2018-04-26: 500 mL via INTRAVENOUS

## 2018-04-26 MED ORDER — HYDRALAZINE HCL 20 MG/ML IJ SOLN
10.0000 mg | Freq: Once | INTRAMUSCULAR | Status: AC
  Administered 2018-04-26: 10 mg via INTRAVENOUS
  Filled 2018-04-26: qty 1

## 2018-04-26 MED ORDER — APIXABAN 2.5 MG PO TABS
2.5000 mg | ORAL_TABLET | Freq: Two times a day (BID) | ORAL | Status: DC
  Administered 2018-04-26 – 2018-05-03 (×14): 2.5 mg via ORAL
  Filled 2018-04-26 (×15): qty 1

## 2018-04-26 MED ORDER — SODIUM CHLORIDE 0.9 % IV SOLN
1.0000 g | INTRAVENOUS | Status: DC
  Administered 2018-04-27 – 2018-04-29 (×3): 1 g via INTRAVENOUS
  Filled 2018-04-26 (×3): qty 1

## 2018-04-26 MED ORDER — ESCITALOPRAM OXALATE 10 MG PO TABS
10.0000 mg | ORAL_TABLET | Freq: Every day | ORAL | Status: DC
  Administered 2018-04-26: 10 mg via ORAL
  Filled 2018-04-26: qty 1

## 2018-04-26 MED ORDER — DUTASTERIDE 0.5 MG PO CAPS
0.5000 mg | ORAL_CAPSULE | Freq: Every day | ORAL | Status: DC
  Administered 2018-04-26: 0.5 mg via ORAL
  Filled 2018-04-26 (×3): qty 1

## 2018-04-26 MED ORDER — CLONAZEPAM 0.5 MG PO TABS: 0.5000 mg | ORAL_TABLET | Freq: Every day | ORAL | Status: DC

## 2018-04-26 MED ORDER — SODIUM CHLORIDE 0.9 % IV SOLN
INTRAVENOUS | Status: AC
  Administered 2018-04-26: 10:00:00 via INTRAVENOUS

## 2018-04-26 NOTE — H&P (Signed)
TRH H&P   Patient Demographics:    [name] [name], is a 84 y.o. male  MRN: [id number]   DOB - 15-Jan-1932  Admit Date - 04/26/2018  Outpatient Primary MD for the patient is Aura Dials, MD  Referring MD/NP/PA: Shanon Rosser  Outpatient Specialists:  Patient coming from:  home  Chief Complaint  Patient presents with  . Altered Mental Status      HPI:    [name] [name]  is a 84 y.o. male, w hypertension, yperlipidemia, Pafib, Aortic insufficiency, CKD stage 3, h/o DVT, Copd, OSA apparently c/o altered mental status.  Apparently left house around noon and ended up in [place], and his son did a missing person, Silver Alert.  Pt seemed more confused than normal.    In ED,  T 97.7, P 65, Bp 123/94  Pox 96% on RA  CT brain IMPRESSION: 1. No acute intracranial pathology. 2. Age-related atrophy and chronic microvascular ischemic changes. 3. Left supraclinoid ICA surgical clips. 4. Chronic paranasal sinus disease.  CXR IMPRESSION: No active cardiopulmonary disease.  Na 143, K 3.9, Bun 32, Creatinine 3.16   Ast 38, Alt 22, Glucose 146  INR 1.16  Wbc 6.4, Hgb 12.5, Plt 181  Pt will be admitted for UTI and altered mental status and also Acute renal failure .    Review of systems:    In addition to the HPI above,   No Fever-chills, No Headache, No changes with Vision or hearing, No problems swallowing food or Liquids, No Chest pain, Cough or Shortness of Breath, No Abdominal pain, No Nausea or Vommitting, Bowel movements are regular, No Blood in stool or Urine, +   dysuria, No new skin rashes or bruises, No new joints pains-aches,  No new weakness, tingling, numbness in any extremity, No recent weight gain or loss, No polyuria, polydypsia or polyphagia, No significant Mental Stressors.  A full 10 point Review of Systems was done, except as stated above, all other  Review of Systems were negative.   With Past History of the following :    Past Medical History:  Diagnosis Date  . Anxiety   . Aortic valve insufficiency   . Arthritis    "wee bit; knees, elbows" (10/15/2014)  . Atrial fibrillation ([place])   . Cardiomyopathy ([place])   . Chronic kidney disease    "down to ~ 1/2 of their regular use; I see kidney dr. in [place]" (10/15/2014)  . Complication of anesthesia    unsure of complications but pt. states there were complications  . COPD (chronic obstructive pulmonary disease) ([place])   . Coronary artery disease   . Decreased testosterone level   . Depression   . Difficult intubation    difficult intubation 12/03/09 and 03/27/10 ([facility]); glidescope used 03/27/10  . DVT (deep venous thrombosis) ([place]) ~ 2013   "BLE"  . Dysrhythmia    A. Fib  .  Emphysema of lung ([place])   . Enlarged prostate   . Falls frequently    > 20 times in the last year/notes 10/15/2014  . GERD (gastroesophageal reflux disease)   . Heart murmur   . History of blood transfusion 1960   "lots; related to an accident"  . History of echocardiogram    post AVR >> Echo 7/16:  Mild LVH, EF 20-25%, AVR ok (peak 15 mmHg, mean 9 mmHg), MAC, mild MR, severe LAE, mild reduced RVSF, mild RAE, PASP 35 mmHg  . Hypercholesteremia   . Hypertension   . Hypothyroidism   . OSA (obstructive sleep apnea)    "suppose to wear mask; I throw it off in my sleep" (10/15/2014)  . Plantar fasciitis   . Right fibular fracture 10/15/2014  . Seasonal allergies   . Shortness of breath dyspnea   . Syncope 10/15/2014  . Syncope and collapse "several times"  . Urine frequency   . Urine incontinence   . Varicose veins       Past Surgical History:  Procedure Laterality Date  . ABDOMINAL AORTIC ANEURYSM REPAIR  01/2006; 03/10/2006   Archie Endo 01/12/2011  . AORTIC VALVE REPLACEMENT N/A 02/11/2015   Procedure: AORTIC VALVE REPLACEMENT (AVR);  Surgeon: Grace Isaac, MD;  Location: [facility];  Service: Open Heart  Surgery;  Laterality: N/A;  . BRAIN SURGERY  1960 X 4   "S/P got my skull busted"; pt. states removed internal carotid artery  . CARDIAC CATHETERIZATION  1990's X 1; 09/2014   no stents  . CLIPPING OF ATRIAL APPENDAGE N/A 02/11/2015   Procedure: CLIPPING OF ATRIAL APPENDAGE;  Surgeon: Grace Isaac, MD;  Location: [facility];  Service: Open Heart Surgery;  Laterality: N/A;  . ERCP  11/2009   Archie Endo 12/05/2009  . gall stone    . HERNIA REPAIR    . LAPAROSCOPIC CHOLECYSTECTOMY  08/2009   w/IOC/notes 09/18/2009  . LAPAROSCOPIC INCISIONAL / UMBILICAL / VENTRAL HERNIA REPAIR  02/2010   VHR w/mesh/notes 03/28/2010  . NOSE SURGERY    . TEE WITHOUT CARDIOVERSION N/A 02/11/2015   Procedure: TRANSESOPHAGEAL ECHOCARDIOGRAM (TEE);  Surgeon: Grace Isaac, MD;  Location: [facility];  Service: Open Heart Surgery;  Laterality: N/A;  . TONSILLECTOMY        Social History:     Social History   Tobacco Use  . Smoking status: Former Smoker    Packs/day: 2.00    Years: 22.00    Pack years: 44.00    Types: Cigarettes    Last attempt to quit: 04/29/1974    Years since quitting: 44.0  . Smokeless tobacco: Never Used  Substance Use Topics  . Alcohol use: Yes    Alcohol/week: 0.0 standard drinks    Comment: 1 beer per year      Lives - at home  Mobility - walks  By self   Family History :     Family History  Problem Relation Age of Onset  . Rheum arthritis Mother   . Diabetes Mother   . Heart disease Father   . Rheum arthritis Father   . Kidney disease Father   . Kidney failure Brother   . Breast cancer Sister   . Other Sister        Murdered  . Depression Sister   . Cancer Sister   . Stroke Sister   . Colon cancer Neg Hx   . Colon polyps Neg Hx   . Liver disease Neg Hx  Home Medications:   Prior to Admission medications   Medication Sig Start Date End Date Taking? Authorizing Provider  apixaban (ELIQUIS) 2.5 MG TABS tablet Take 1 tablet (2.5 mg total) by mouth 2 (two) times  daily. 02/21/18   Nahser, [name], MD  atorvastatin (LIPITOR) 10 MG tablet Take 1 tablet (10 mg total) by mouth daily. 02/28/15   Angiulli, Lavon Paganini, PA-C  buPROPion (WELLBUTRIN XL) 300 MG 24 hr tablet Take 1 tablet (300 mg total) by mouth daily. 02/28/15   Angiulli, Lavon Paganini, PA-C  busPIRone (BUSPAR) 15 MG tablet Take 1 tablet (15 mg total) by mouth 2 (two) times daily. 02/28/15   Angiulli, Lavon Paganini, PA-C  calcium carbonate (TUMS - DOSED IN MG ELEMENTAL CALCIUM) 500 MG chewable tablet Chew 1 tablet (200 mg of elemental calcium total) by mouth 3 (three) times daily as needed for indigestion or heartburn. 01/29/18   Debbe Odea, MD  carvedilol (COREG) 25 MG tablet Take 25 mg by mouth 2 (two) times daily with a meal.    [provider]  clonazePAM (KLONOPIN) 0.5 MG tablet Take 1 tablet (0.5 mg total) by mouth at bedtime. 02/28/15   Angiulli, Lavon Paganini, PA-C  dutasteride (AVODART) 0.5 MG capsule Take 1 capsule (0.5 mg total) by mouth daily. 07/16/15   Meredith Staggers, MD  escitalopram (LEXAPRO) 10 MG tablet Take 10 mg by mouth daily. 01/21/18   [provider]  famotidine (PEPCID) 20 MG tablet Take 1 tablet (20 mg total) by mouth 2 (two) times daily as needed for heartburn or indigestion. 01/29/18 01/29/19  Debbe Odea, MD  folic acid (FOLVITE) 1 MG tablet Take 1 tablet (1 mg total) by mouth daily. 08/13/15   Meredith Staggers, MD  furosemide (LASIX) 20 MG tablet Take 1 tablet (20 mg total) by mouth daily as needed for fluid. Only if you gain > 3 lb in 24 hrs 01/29/18   Debbe Odea, MD  guaiFENesin (MUCINEX) 600 MG 12 hr tablet Take 1 tablet (600 mg total) by mouth 2 (two) times daily. 01/29/18   Debbe Odea, MD  ipratropium-albuterol (DUONEB) 0.5-2.5 (3) MG/3ML SOLN Take 3 mLs by nebulization every 4 (four) hours as needed (for wheezing associated with shortness of breath). 01/29/18   Debbe Odea, MD  lamoTRIgine (LAMICTAL) 100 MG tablet Take 1 tablet (100 mg total) by mouth 2 (two) times daily.  Breakfast and dinner 02/28/15   Angiulli, Lavon Paganini, PA-C  levothyroxine (SYNTHROID, LEVOTHROID) 175 MCG tablet Take 175 mcg by mouth daily before breakfast.     [provider]  mirtazapine (REMERON) 7.5 MG tablet Take 15 mg by mouth at bedtime.  10/13/15   [provider]  omeprazole (PRILOSEC) 20 MG capsule Take 1 capsule (20 mg total) by mouth daily. 12/08/16   Juanito Doom, MD  polyethylene glycol (MIRALAX / GLYCOLAX) packet Take 17 g by mouth daily as needed for mild constipation. 01/29/18   Debbe Odea, MD  potassium chloride (K-DUR) 10 MEQ tablet TAKE 1 TABLET BY MOUTH EVERY DAY. KEEP UPCOMING APPOINTMENT IN JULY FOR FUTURE REFILLS. 04/07/18   Nahser, [name], MD  potassium chloride (MICRO-K) 10 MEQ CR capsule Take 10 mEq by mouth 2 (two) times daily.    [provider]  senna (SENOKOT) 8.6 MG TABS tablet Take 1 tablet (8.6 mg total) by mouth 2 (two) times daily. Patient not taking: Reported on 03/17/2018 01/29/18   Debbe Odea, MD  Tiotropium Bromide-Olodaterol (STIOLTO RESPIMAT) 2.5-2.5 MCG/ACT AERS Inhale  2 puffs into the lungs daily. 02/23/17   Juanito Doom, MD  tolnaftate (TINACTIN) 1 % powder Apply topically 2 (two) times daily. 01/29/18   Debbe Odea, MD     Allergies:    No Known Allergies   Physical Exam:   Vitals  Blood pressure (!) 123/94, pulse 65, temperature 97.7 F (36.5 C), temperature source Oral, resp. rate (!) 25, height 6' (1.829 m), weight 136.1 kg, SpO2 96 %.   1. General  lying in bed in NAD,    2. Normal affect and insight, Not Suicidal or Homicidal, Awake Alert, Oriented X 2, person, place.  3. No F.N deficits, ALL C.Nerves Intact, Strength 5/5 all 4 extremities, Sensation intact all 4 extremities, Plantars down going.  4. Ears and Eyes appear Normal, Conjunctivae clear, PERRLA. Moist Oral Mucosa.  5. Supple Neck, No JVD, No cervical lymphadenopathy appriciated, No Carotid Bruits.  6. Symmetrical Chest wall movement,  Good air movement bilaterally, CTAB.  7. Irr, irr, s1, s2,   8. Positive Bowel Sounds, Abdomen Soft, No tenderness, No organomegaly appriciated,No rebound -guarding or rigidity.  9.  No Cyanosis, Normal Skin Turgor, No Skin Rash or Bruise.  10. Good muscle tone,  joints appear normal , no effusions, Normal ROM.  11. No Palpable Lymph Nodes in Neck or Axillae     Data Review:    CBC Recent Labs  Lab 04/26/18 0503  WBC 6.4  HGB 12.5*  HCT 40.2  PLT 181  MCV 99.3  MCH 30.9  MCHC 31.1  RDW 15.9*  LYMPHSABS 2.8  MONOABS 0.3  EOSABS 0.1  BASOSABS 0.0   ------------------------------------------------------------------------------------------------------------------  Chemistries  Recent Labs  Lab 04/26/18 0439  NA 143  K 3.9  CL 100  CO2 31  GLUCOSE 146*  BUN 32*  CREATININE 3.16*  CALCIUM 9.5  AST 38  ALT 22  ALKPHOS 91  BILITOT 0.9   ------------------------------------------------------------------------------------------------------------------ estimated creatinine clearance is 24.9 mL/min (A) (by C-G formula based on SCr of 3.16 mg/dL (H)). ------------------------------------------------------------------------------------------------------------------ No results for input(s): TSH, T4TOTAL, T3FREE, THYROIDAB in the last 72 hours.  Invalid input(s): FREET3  Coagulation profile Recent Labs  Lab 04/26/18 0439  INR 1.16   ------------------------------------------------------------------------------------------------------------------- No results for input(s): DDIMER in the last 72 hours. -------------------------------------------------------------------------------------------------------------------  Cardiac Enzymes No results for input(s): CKMB, TROPONINI, MYOGLOBIN in the last 168 hours.  Invalid input(s): CK ------------------------------------------------------------------------------------------------------------------    Component Value  Date/Time   BNP 130.2 (H) 01/28/2018 0111     ---------------------------------------------------------------------------------------------------------------  Urinalysis    Component Value Date/Time   COLORURINE AMBER (A) 04/26/2018 0442   APPEARANCEUR HAZY (A) 04/26/2018 0442   LABSPEC 1.033 (H) 04/26/2018 0442   PHURINE 5.0 04/26/2018 0442   GLUCOSEU NEGATIVE 04/26/2018 0442   HGBUR NEGATIVE 04/26/2018 0442   BILIRUBINUR MODERATE (A) 04/26/2018 0442   KETONESUR 5 (A) 04/26/2018 0442   PROTEINUR 30 (A) 04/26/2018 0442   UROBILINOGEN 2.0 (H) 02/21/2015 1241   NITRITE NEGATIVE 04/26/2018 0442   LEUKOCYTESUR TRACE (A) 04/26/2018 0442    ----------------------------------------------------------------------------------------------------------------   Imaging Results:    Dg Chest 2 View  Result Date: 04/26/2018 CLINICAL DATA:  84 year old male with shortness of breath and altered mental status. EXAM: CHEST - 2 VIEW COMPARISON:  Chest radiograph dated 01/27/2018 FINDINGS: No focal consolidation, pleural effusion, or pneumothorax. There is eventration of the right hemidiaphragm. Stable cardiomegaly and prominence of the aorta. Median sternotomy wires and left atrial appendage occlusive device. IMPRESSION: No active cardiopulmonary disease. Electronically Signed  By: Anner Crete M.D.   On: 04/26/2018 05:29   Ct Head Wo Contrast  Result Date: 04/26/2018 CLINICAL DATA:  84 year old male with altered mental status. EXAM: CT HEAD WITHOUT CONTRAST TECHNIQUE: Contiguous axial images were obtained from the base of the skull through the vertex without intravenous contrast. COMPARISON:  Head CT dated 12/18/2015 FINDINGS: Brain: There is moderate age-related atrophy and chronic microvascular ischemic changes. There is no acute intracranial hemorrhage. No mass effect or midline shift. No extra-axial fluid collection. Vascular: Vascular clip in the region of the left supraclinoid ICA. High  attenuating appearance of the remainder of the cerebral vasculature, likely related to hemoconcentration/dehydration. Skull: Postsurgical changes of the left frontal calvarium. No acute calvarial pathology. Sinuses/Orbits: Diffuse mucoperiosteal thickening of paranasal sinuses with complete opacification of the right maxillary sinus similar to prior CT. No air-fluid level. The mastoid air cells are clear. Other: None IMPRESSION: 1. No acute intracranial pathology. 2. Age-related atrophy and chronic microvascular ischemic changes. 3. Left supraclinoid ICA surgical clips. 4. Chronic paranasal sinus disease. Electronically Signed   By: Anner Crete M.D.   On: 04/26/2018 05:12       Assessment & Plan:    Principal Problem:   Altered mental status Active Problems:   Acute renal failure (ARF) (HCC)   Acute lower UTI   Anemia    AMS CT brain 8/28 negative Probably related to UTI Hopefully will improve, may have baseline dementia  UTI Awaiting urine culture Rocephin 1gm iv qday  ARF Hydrate with ns gently HOLD Lasix Check renal ultrasound r/o obstruction  Anemia Check cbc   Hypothyroidism Cont levothyroxine 175 micrograms po qday Check TSH  BPH Cont Avodart 0.5m po qhs  Pafib Cont Eliquis, pharmacy to dose  Anxiety Cont Klonopin prn  Cont Wellbutrin XL 3027mpo qday Cont Buspar 1522mo bid Cont Lexapro 93m18m qday Cont Remeron 15mg67mqhs  Copd Stiolto=> spiriva DC duoneb, use of atrovent + spiriva or stiolto actually causes decrease in lung function.    DVT Prophylaxis   Lovenox - SCDs   AM Labs Ordered, also please review Full Orders  Family Communication: Admission, patients condition and plan of care including tests being ordered have been discussed with the patient  who indicate understanding and agree with the plan and Code Status.  Code Status  FULL CODE  Likely DC to  home  Condition GUARDED    Consults called:   none  Admission status:  observation,  Pt has altered mental status secondary to uti, due to this along with acute renal failure and creatinine >3 , pt will need hospitalization and ivf to improve creatinine, if his creatinine doesn't improve quickly he might need an inpatient stay >2 nights, will admit observation for now.   Time spent in minutes : 70   JamesJani Gravelon 04/26/2018 at 5:49 AM  Between 7am to 7pm - Pager - [phone] 7pm go to [email or web address] - password [email or web address] - Office  [phone]

## 2018-04-26 NOTE — ED Notes (Signed)
Patient went upstairs to assigned room.

## 2018-04-26 NOTE — ED Notes (Signed)
ED TO INPATIENT HANDOFF REPORT  Name/Age/Gender [name] [name] 84 y.o. male  Code Status Code Status History    Date Active Date Inactive Code Status Order ID Comments User Context   01/28/2018 0332 01/29/2018 1808 Full Code [id number]  Toy Baker, MD Inpatient   01/28/2018 0332 01/28/2018 0332 Full Code [id number]  Toy Baker, MD Inpatient   01/05/2018 2057 01/10/2018 2127 Full Code [id number]  Cristy Folks, MD Inpatient   02/20/2015 1834 03/01/2015 1413 Full Code [id number]  Cathlyn Parsons, PA-C Inpatient   02/11/2015 1402 02/20/2015 1834 Full Code [id number]  Marcene Corning Inpatient   11/11/2014 1636 02/11/2015 1402 Full Code [id number]  Hennie Duos, MD Outpatient   10/15/2014 0934 10/18/2014 1859 Partial Code [id number] DNI Karen Kitchens Inpatient   09/29/2014 1328 10/15/2014 0934 Full Code [id number]  Hennie Duos, MD Outpatient   06/20/2014 1513 06/22/2014 1653 Full Code [id number]  Caren Griffins, MD ED      Home/SNF/Other Home  Chief Complaint UTI;Altered Mental Status  Level of Care/Admitting Diagnosis ED Disposition    ED Disposition Condition Comment   Admit  Hospital Area: [facility] [[id number]]  Level of Care: Telemetry [5]  Admit to tele based on following criteria: Monitor for Ischemic changes  Diagnosis: Altered mental status [780.97.ICD-9-CM]  Admitting Physician: Jani Gravel [[id number]]  Attending Physician: Jani Gravel [[id number]]  PT Class (Do Not Modify): Observation [104]  PT Acc Code (Do Not Modify): Observation [10022]       Medical History Past Medical History:  Diagnosis Date  . Anxiety   . Aortic valve insufficiency   . Arthritis    "wee bit; knees, elbows" (10/15/2014)  . Atrial fibrillation ([place])   . Cardiomyopathy ([place])   . Chronic kidney disease    "down to ~ 1/2 of their regular use; I see kidney dr. in [place]" (10/15/2014)  . Complication of anesthesia    unsure of complications but pt. states there  were complications  . COPD (chronic obstructive pulmonary disease) ([place])   . Coronary artery disease   . Decreased testosterone level   . Depression   . Difficult intubation    difficult intubation 12/03/09 and 03/27/10 ([facility]); glidescope used 03/27/10  . DVT (deep venous thrombosis) ([place]) ~ 2013   "BLE"  . Dysrhythmia    A. Fib  . Emphysema of lung ([place])   . Enlarged prostate   . Falls frequently    > 20 times in the last year/notes 10/15/2014  . GERD (gastroesophageal reflux disease)   . Heart murmur   . History of blood transfusion 1960   "lots; related to an accident"  . History of echocardiogram    post AVR >> Echo 7/16:  Mild LVH, EF 20-25%, AVR ok (peak 15 mmHg, mean 9 mmHg), MAC, mild MR, severe LAE, mild reduced RVSF, mild RAE, PASP 35 mmHg  . Hypercholesteremia   . Hypertension   . Hypothyroidism   . OSA (obstructive sleep apnea)    "suppose to wear mask; I throw it off in my sleep" (10/15/2014)  . Plantar fasciitis   . Right fibular fracture 10/15/2014  . Seasonal allergies   . Shortness of breath dyspnea   . Syncope 10/15/2014  . Syncope and collapse "several times"  . Urine frequency   . Urine incontinence   . Varicose veins     Allergies No Known Allergies  IV Location/Drains/Wounds Patient Lines/Drains/Airways Status   Active Line/Drains/Airways    Name:  Placement date:   Placement time:   Site:   Days:   Peripheral IV 04/26/18 Left Antecubital   04/26/18    0443    Antecubital   less than 1   Incision (Closed) 02/11/15 Chest Other (Comment)   02/11/15    1029     1170   Incision (Closed) 02/20/15 Thigh Left   02/20/15    1858     1161   Incision (Closed) 02/20/15 Leg Left   02/20/15    1858     1161   Incision (Closed) 02/20/15 Ankle Left   02/20/15    1858     1161   Incision (Closed) 02/20/15 Thigh Right   02/20/15    1858     1161   Incision (Closed) 02/20/15 Leg Right   02/20/15    1858     1161   Wound / Incision (Open or Dehisced) 01/05/18 Other  (Comment) Leg Right   01/05/18    2100    Leg   111          Labs/Imaging Results for orders placed or performed during the hospital encounter of 04/26/18 (from the past 48 hour(s))  Comprehensive metabolic panel     Status: Abnormal   Collection Time: 04/26/18  4:39 AM  Result Value Ref Range   Sodium 143 135 - 145 mmol/L   Potassium 3.9 3.5 - 5.1 mmol/L   Chloride 100 98 - 111 mmol/L   CO2 31 22 - 32 mmol/L   Glucose, Bld 146 (H) 70 - 99 mg/dL   BUN 32 (H) 8 - 23 mg/dL   Creatinine, Ser 3.16 (H) 0.61 - 1.24 mg/dL   Calcium 9.5 8.9 - 10.3 mg/dL   Total Protein 7.8 6.5 - 8.1 g/dL   Albumin 4.4 3.5 - 5.0 g/dL   AST 38 15 - 41 U/L   ALT 22 0 - 44 U/L   Alkaline Phosphatase 91 38 - 126 U/L   Total Bilirubin 0.9 0.3 - 1.2 mg/dL   GFR calc non Af Amer 17 (L) >60 mL/min   GFR calc Af Amer 19 (L) >60 mL/min    Comment: (NOTE) The eGFR has been calculated using the CKD EPI equation. This calculation has not been validated in all clinical situations. eGFR's persistently <60 mL/min signify possible Chronic Kidney Disease.    Anion gap 12 5 - 15    Comment: Performed at [facility], [place] [address], [place], [place] [address]  Protime-INR     Status: None   Collection Time: 04/26/18  4:39 AM  Result Value Ref Range   Prothrombin Time 14.7 11.4 - 15.2 seconds   INR 1.16     Comment: Performed at [facility], [place] [address] [place], [place] [address]  APTT     Status: None   Collection Time: 04/26/18  4:39 AM  Result Value Ref Range   aPTT 36 24 - 36 seconds    Comment: Performed at [facility], [place] [address], [place], [place] [address]  Urinalysis, Routine w reflex microscopic     Status: Abnormal   Collection Time: 04/26/18  4:42 AM  Result Value Ref Range   Color, Urine AMBER (A) YELLOW    Comment: BIOCHEMICALS MAY BE AFFECTED BY COLOR   APPearance HAZY (A) CLEAR   Specific Gravity, Urine 1.033 (H) 1.005 - 1.030    pH 5.0 5.0 - 8.0   Glucose, UA NEGATIVE NEGATIVE mg/dL  Hgb urine dipstick NEGATIVE NEGATIVE   Bilirubin Urine MODERATE (A) NEGATIVE   Ketones, ur 5 (A) NEGATIVE mg/dL   Protein, ur 30 (A) NEGATIVE mg/dL   Nitrite NEGATIVE NEGATIVE   Leukocytes, UA TRACE (A) NEGATIVE   RBC / HPF 6-10 0 - 5 RBC/hpf   WBC, UA 21-50 0 - 5 WBC/hpf   Bacteria, UA RARE (A) NONE SEEN   Squamous Epithelial / LPF 0-5 0 - 5   Mucus PRESENT    Hyaline Casts, UA PRESENT     Comment: Performed at [facility], [place] [address] [place], [place] [address]  I-stat troponin, ED     Status: None   Collection Time: 04/26/18  4:49 AM  Result Value Ref Range   Troponin i, poc 0.03 0.00 - 0.08 ng/mL   Comment 3            Comment: Due to the release kinetics of cTnI, a negative result within the first hours of the onset of symptoms does not rule out myocardial infarction with certainty. If myocardial infarction is still suspected, repeat the test at appropriate intervals.   I-Stat CG4 Lactic Acid, ED     Status: None   Collection Time: 04/26/18  4:50 AM  Result Value Ref Range   Lactic Acid, Venous 1.64 0.5 - 1.9 mmol/L  CBC with Differential/Platelet     Status: Abnormal   Collection Time: 04/26/18  5:03 AM  Result Value Ref Range   WBC 6.4 4.0 - 10.5 K/uL   RBC 4.05 (L) 4.22 - 5.81 MIL/uL   Hemoglobin 12.5 (L) 13.0 - 17.0 g/dL   HCT 40.2 39.0 - 52.0 %   MCV 99.3 78.0 - 100.0 fL   MCH 30.9 26.0 - 34.0 pg   MCHC 31.1 30.0 - 36.0 g/dL   RDW 15.9 (H) 11.5 - 15.5 %   Platelets 181 150 - 400 K/uL   Neutrophils Relative % 49 %   Neutro Abs 3.2 1.7 - 7.7 K/uL   Lymphocytes Relative 44 %   Lymphs Abs 2.8 0.7 - 4.0 K/uL   Monocytes Relative 5 %   Monocytes Absolute 0.3 0.1 - 1.0 K/uL   Eosinophils Relative 2 %   Eosinophils Absolute 0.1 0.0 - 0.7 K/uL   Basophils Relative 0 %   Basophils Absolute 0.0 0.0 - 0.1 K/uL    Comment: Performed at [facility], [place] [address], [place], [place] [address]   Dg Chest 2 View  Result Date: 04/26/2018 CLINICAL DATA:  84 year old male with shortness of breath and altered mental status. EXAM: CHEST - 2 VIEW COMPARISON:  Chest radiograph dated 01/27/2018 FINDINGS: No focal consolidation, pleural effusion, or pneumothorax. There is eventration of the right hemidiaphragm. Stable cardiomegaly and prominence of the aorta. Median sternotomy wires and left atrial appendage occlusive device. IMPRESSION: No active cardiopulmonary disease. Electronically Signed   By: Anner Crete M.D.   On: 04/26/2018 05:29   Ct Head Wo Contrast  Result Date: 04/26/2018 CLINICAL DATA:  84 year old male with altered mental status. EXAM: CT HEAD WITHOUT CONTRAST TECHNIQUE: Contiguous axial images were obtained from the base of the skull through the vertex without intravenous contrast. COMPARISON:  Head CT dated 12/18/2015 FINDINGS: Brain: There is moderate age-related atrophy and chronic microvascular ischemic changes. There is no acute intracranial hemorrhage. No mass effect or midline shift. No extra-axial fluid collection. Vascular: Vascular clip in the region of the left supraclinoid ICA. High attenuating appearance of the remainder of  the cerebral vasculature, likely related to hemoconcentration/dehydration. Skull: Postsurgical changes of the left frontal calvarium. No acute calvarial pathology. Sinuses/Orbits: Diffuse mucoperiosteal thickening of paranasal sinuses with complete opacification of the right maxillary sinus similar to prior CT. No air-fluid level. The mastoid air cells are clear. Other: None IMPRESSION: 1. No acute intracranial pathology. 2. Age-related atrophy and chronic microvascular ischemic changes. 3. Left supraclinoid ICA surgical clips. 4. Chronic paranasal sinus disease. Electronically Signed   By: Anner Crete M.D.   On: 04/26/2018 05:12    Pending Labs Unresulted Labs (From admission, onward)    Start     Ordered   04/26/18  0503  Blood culture (routine x 2)  BLOOD CULTURE X 2,   STAT     04/26/18 0503   04/26/18 0503  Urine culture  STAT,   STAT     04/26/18 0503          Vitals/Pain Today's Vitals   04/26/18 0431 04/26/18 0445 04/26/18 0616 04/26/18 0618  BP:  (!) 123/94  129/84  Pulse:  65  81  Resp:  (!) 25  20  Temp:  97.7 F (36.5 C) 97.7 F (36.5 C) 97.7 F (36.5 C)  TempSrc:  Oral  Oral  SpO2:  96%  98%  Weight: 136.1 kg     Height: 6' (1.829 m)     PainSc:    0-No pain    Isolation Precautions No active isolations  Medications Medications  cefTRIAXone (ROCEPHIN) 1 g in sodium chloride 0.9 % 100 mL IVPB (1 g Intravenous New Bag/Given 04/26/18 0612)  sodium chloride 0.9 % bolus 500 mL (500 mLs Intravenous New Bag/Given 04/26/18 2633)    Mobility walks with person assist

## 2018-04-26 NOTE — Progress Notes (Signed)
[facility] PLAN OF CARE NOTE Patient: Willette ClusterRobert Tomei ZOX:[id number]   PCP: Henrine Screwshacker, [place], MD DOB: 1931/09/22   DOA: 04/26/2018   DOS: 04/26/2018    Patient was admitted by my colleague Dr. Selena BattenKim earlier on 04/26/2018. I have reviewed the H&P as well as assessment and plan and agree with the same. Important changes in the plan are listed below.  Plan of care: Principal Problem:   Altered mental status Active Problems:   Acute renal failure (ARF) (HCC)   Acute lower UTI   Anemia Likely combination of acute kidney injury, dehydration, multiple psychotropic medication accumulating in the system, and a possible UTI along with worsening hypothyroidism. Synthroid dose increased to 200 MCG. We will recheck free T4 and TSH and T3 tomorrow. Patient will need endocrinologist outpatient. Continue IV fluids, continue IV antibiotics. Resuming some of the home psychotropic medication would prefer to discontinue others.  Author: Lynden OxfordPranav Patel, MD [facility] Pager: [phone] 04/26/2018 1:46 PM   If 7PM-7AM, please contact night-coverage at [email or web address], password [email or web address]

## 2018-04-26 NOTE — ED Notes (Signed)
Tried to call and give report. Bed has been pinning for 24 min.

## 2018-04-26 NOTE — ED Triage Notes (Signed)
Pt is brought in by his sone after being missing for 14 hours Son states that he's usually very high functioning but in the last few weeks he's been declining Yesterday the patient left in [place] at 11am [place], pt lives in [place], the family had issued a silver alert which helped locate the patient The son picked him up and brought him to the ED, EMS evaluated pt on scene

## 2018-04-26 NOTE — Evaluation (Signed)
Physical Therapy Evaluation Patient Details Name: [name] [name] MRN: [id number] DOB: 1932-04-08 Today's Date: 04/26/2018   History of Present Illness  84 yo male admitted with AMS, UTI. Hx of HTN, CKD, COPD, syncope, frequent falls, CAD, CHF  Clinical Impression  On eval, pt required Min assist +2 for safety for mobility. He walked ~20 feet with a RW. Pt is unsteady and unsafe when ambulating. Remained on [place] O2. No family present during session. Limited PLOF info from pt. Will follow and progress activity as tolerated. At this time, will recommend ST rehab at SNF if pt/family are agreeable.     Follow Up Recommendations SNF(HHPT, 24/7 care if pt/family decline placement)    Equipment Recommendations  None recommended by PT    Recommendations for Other Services       Precautions / Restrictions Precautions Precautions: Fall Precaution Comments: O2 dep Restrictions Weight Bearing Restrictions: No      Mobility  Bed Mobility Overal bed mobility: Needs Assistance Bed Mobility: Supine to Sit     Supine to sit: Min assist     General bed mobility comments: min A, extra time for in/out of bed. Assist for trunk OOB and legs for back to bed  Transfers Overall transfer level: Needs assistance Equipment used: Rolling walker (2 wheeled) Transfers: Sit to/from Stand Sit to Stand: From elevated surface;Min assist Stand pivot transfers: Min assist;+2 safety/equipment       General transfer comment: Assist to rise, stabilize, control descent. VCs safety, hand placement.   Ambulation/Gait Ambulation/Gait assistance: Min assist;+2 safety/equipment Gait Distance (Feet): 20 Feet Assistive device: Rolling walker (2 wheeled) Gait Pattern/deviations: Step-through pattern;Trunk flexed     General Gait Details: Assist to stabilize pt and maneuver safely with the walker. Cues for safety, proper use of RW.   Stairs            Wheelchair Mobility    Modified Rankin (Stroke  Patients Only)       Balance Overall balance assessment: Needs assistance;History of Falls         Standing balance support: Bilateral upper extremity supported Standing balance-Leahy Scale: Poor                               Pertinent Vitals/Pain Pain Assessment: Faces Faces Pain Scale: Hurts little more Pain Location: all over Pain Descriptors / Indicators: Aching;Sore Pain Intervention(s): Limited activity within patient's tolerance;Repositioned    Home Living Family/patient expects to be discharged to:: Unsure Living Arrangements: Spouse/significant other Available Help at Discharge: Family Type of Home: House Home Access: Level entry     Home Layout: Two level Home Equipment: Environmental consultantWalker - 2 wheels;Cane - single point Additional Comments: difficulty answering PLOF questions:  per chart, lives with spouse and has RW.  RN has seen pt's son.      Prior Function           Comments: pt stated he does not use a RW     Hand Dominance        Extremity/Trunk Assessment   Upper Extremity Assessment Upper Extremity Assessment: Defer to OT evaluation    Lower Extremity Assessment Lower Extremity Assessment: Generalized weakness       Communication   Communication: (difficult to understand, mumbles)  Cognition Arousal/Alertness: Awake/alert Behavior During Therapy: WFL for tasks assessed/performed Overall Cognitive Status: No family/caregiver present to determine baseline cognitive functioning  General Comments: came in with AMS      General Comments General comments (skin integrity, edema, etc.): on 2 liters 02; sats 98%    Exercises     Assessment/Plan    PT Assessment Patient needs continued PT services  PT Problem List Decreased strength;Decreased balance;Decreased mobility;Decreased safety awareness;Decreased activity tolerance;Decreased knowledge of use of DME       PT Treatment  Interventions DME instruction;Gait training;Functional mobility training;Therapeutic activities;Balance training;Patient/family education;Therapeutic exercise    PT Goals (Current goals can be found in the Care Plan section)  Acute Rehab PT Goals Patient Stated Goal: none stated PT Goal Formulation: With patient Time For Goal Achievement: 05/10/18 Potential to Achieve Goals: Good    Frequency Min 3X/week   Barriers to discharge        Co-evaluation   Reason for Co-Treatment: For patient/therapist safety PT goals addressed during session: Mobility/safety with mobility OT goals addressed during session: ADL's and self-care       AM-PAC PT "6 Clicks" Daily Activity  Outcome Measure Difficulty turning over in bed (including adjusting bedclothes, sheets and blankets)?: A Lot Difficulty moving from lying on back to sitting on the side of the bed? : Unable Difficulty sitting down on and standing up from a chair with arms (e.g., wheelchair, bedside commode, etc,.)?: Unable Help needed moving to and from a bed to chair (including a wheelchair)?: A Lot Help needed walking in hospital room?: A Lot Help needed climbing 3-5 steps with a railing? : Total 6 Click Score: 9    End of Session Equipment Utilized During Treatment: Oxygen Activity Tolerance: Patient tolerated treatment well Patient left: in bed;with call bell/phone within reach;with bed alarm set   PT Visit Diagnosis: Muscle weakness (generalized) (M62.81);Difficulty in walking, not elsewhere classified (R26.2)    Time: 1610-9604 PT Time Calculation (min) (ACUTE ONLY): 12 min   Charges:   PT Evaluation $PT Eval Moderate Complexity: 1 Mod           Rebeca Alert, MPT Pager: [phone]

## 2018-04-26 NOTE — ED Provider Notes (Signed)
[facility] DEPT Provider Note: Georgena Spurling, MD, FACEP  CSN: [id number] MRN: [id number] ARRIVAL: 04/26/18 at [place]: [place]  Altered Mental Status   HISTORY OF PRESENT ILLNESS  04/26/18 4:51 AM [name] [name] is a 84 y.o. male who left home by car yesterday morning about 11 AM.  He called his son yesterday afternoon stating he was at the beach but in fact was in [facility].  He was confused, stating he had seen people and animals yesterday morning that were in fact deceased.  He has had a couple of episodes of similar confusion in the past few weeks but not persistently.  His son contacted the police who issued a Silver Alert. The patient was finally located about 2 AM today and evaluated by EMS who found him to be confused but oriented.  He is complaining of mild generalized pain and some mild dysuria.  He denies chest pain, abdominal pain, shortness of breath beyond his baseline COPD.  His son notes that his voice is more gravelly than usual.   Past Medical History:  Diagnosis Date  . Anxiety   . Aortic valve insufficiency   . Arthritis    "wee bit; knees, elbows" (10/15/2014)  . Atrial fibrillation ([place])   . Cardiomyopathy ([place])   . Chronic kidney disease    "down to ~ 1/2 of their regular use; I see kidney dr. in [place]" (10/15/2014)  . Complication of anesthesia    unsure of complications but pt. states there were complications  . COPD (chronic obstructive pulmonary disease) ([place])   . Coronary artery disease   . Decreased testosterone level   . Depression   . Difficult intubation    difficult intubation 12/03/09 and 03/27/10 ([facility]); glidescope used 03/27/10  . DVT (deep venous thrombosis) ([place]) ~ 2013   "BLE"  . Dysrhythmia    A. Fib  . Emphysema of lung ([place])   . Enlarged prostate   . Falls frequently    > 20 times in the last year/notes 10/15/2014  . GERD (gastroesophageal reflux disease)   . Heart murmur   . History of blood  transfusion 1960   "lots; related to an accident"  . History of echocardiogram    post AVR >> Echo 7/16:  Mild LVH, EF 20-25%, AVR ok (peak 15 mmHg, mean 9 mmHg), MAC, mild MR, severe LAE, mild reduced RVSF, mild RAE, PASP 35 mmHg  . Hypercholesteremia   . Hypertension   . Hypothyroidism   . OSA (obstructive sleep apnea)    "suppose to wear mask; I throw it off in my sleep" (10/15/2014)  . Plantar fasciitis   . Right fibular fracture 10/15/2014  . Seasonal allergies   . Shortness of breath dyspnea   . Syncope 10/15/2014  . Syncope and collapse "several times"  . Urine frequency   . Urine incontinence   . Varicose veins     Past Surgical History:  Procedure Laterality Date  . ABDOMINAL AORTIC ANEURYSM REPAIR  01/2006; 03/10/2006   Archie Endo 01/12/2011  . AORTIC VALVE REPLACEMENT N/A 02/11/2015   Procedure: AORTIC VALVE REPLACEMENT (AVR);  Surgeon: Grace Isaac, MD;  Location: [facility];  Service: Open Heart Surgery;  Laterality: N/A;  . BRAIN SURGERY  1960 X 4   "S/P got my skull busted"; pt. states removed internal carotid artery  . CARDIAC CATHETERIZATION  1990's X 1; 09/2014   no stents  . CLIPPING OF ATRIAL APPENDAGE N/A 02/11/2015   Procedure:  CLIPPING OF ATRIAL APPENDAGE;  Surgeon: Grace Isaac, MD;  Location: [facility];  Service: Open Heart Surgery;  Laterality: N/A;  . ERCP  11/2009   Archie Endo 12/05/2009  . gall stone    . HERNIA REPAIR    . LAPAROSCOPIC CHOLECYSTECTOMY  08/2009   w/IOC/notes 09/18/2009  . LAPAROSCOPIC INCISIONAL / UMBILICAL / VENTRAL HERNIA REPAIR  02/2010   VHR w/mesh/notes 03/28/2010  . NOSE SURGERY    . TEE WITHOUT CARDIOVERSION N/A 02/11/2015   Procedure: TRANSESOPHAGEAL ECHOCARDIOGRAM (TEE);  Surgeon: Grace Isaac, MD;  Location: [facility];  Service: Open Heart Surgery;  Laterality: N/A;  . TONSILLECTOMY      Family History  Problem Relation Age of Onset  . Rheum arthritis Mother   . Diabetes Mother   . Heart disease Father   . Rheum arthritis Father   .  Kidney disease Father   . Kidney failure Brother   . Breast cancer Sister   . Other Sister        Murdered  . Depression Sister   . Cancer Sister   . Stroke Sister   . Colon cancer Neg Hx   . Colon polyps Neg Hx   . Liver disease Neg Hx     Social History   Tobacco Use  . Smoking status: Former Smoker    Packs/day: 2.00    Years: 22.00    Pack years: 44.00    Types: Cigarettes    Last attempt to quit: 04/29/1974    Years since quitting: 44.0  . Smokeless tobacco: Never Used  Substance Use Topics  . Alcohol use: Yes    Alcohol/week: 0.0 standard drinks    Comment: 1 beer per year   . Drug use: No    Prior to Admission medications   Medication Sig Start Date End Date Taking? Authorizing Provider  apixaban (ELIQUIS) 2.5 MG TABS tablet Take 1 tablet (2.5 mg total) by mouth 2 (two) times daily. 02/21/18   Nahser, [name], MD  atorvastatin (LIPITOR) 10 MG tablet Take 1 tablet (10 mg total) by mouth daily. 02/28/15   Angiulli, Lavon Paganini, PA-C  buPROPion (WELLBUTRIN XL) 300 MG 24 hr tablet Take 1 tablet (300 mg total) by mouth daily. 02/28/15   Angiulli, Lavon Paganini, PA-C  busPIRone (BUSPAR) 15 MG tablet Take 1 tablet (15 mg total) by mouth 2 (two) times daily. 02/28/15   Angiulli, Lavon Paganini, PA-C  calcium carbonate (TUMS - DOSED IN MG ELEMENTAL CALCIUM) 500 MG chewable tablet Chew 1 tablet (200 mg of elemental calcium total) by mouth 3 (three) times daily as needed for indigestion or heartburn. 01/29/18   Debbe Odea, MD  carvedilol (COREG) 25 MG tablet Take 25 mg by mouth 2 (two) times daily with a meal.    [provider]  clonazePAM (KLONOPIN) 0.5 MG tablet Take 1 tablet (0.5 mg total) by mouth at bedtime. 02/28/15   Angiulli, Lavon Paganini, PA-C  dutasteride (AVODART) 0.5 MG capsule Take 1 capsule (0.5 mg total) by mouth daily. 07/16/15   Meredith Staggers, MD  escitalopram (LEXAPRO) 10 MG tablet Take 10 mg by mouth daily. 01/21/18   [provider]  famotidine (PEPCID) 20 MG  tablet Take 1 tablet (20 mg total) by mouth 2 (two) times daily as needed for heartburn or indigestion. 01/29/18 01/29/19  Debbe Odea, MD  folic acid (FOLVITE) 1 MG tablet Take 1 tablet (1 mg total) by mouth daily. 08/13/15   Meredith Staggers, MD  furosemide (  LASIX) 20 MG tablet Take 1 tablet (20 mg total) by mouth daily as needed for fluid. Only if you gain > 3 lb in 24 hrs 01/29/18   Debbe Odea, MD  guaiFENesin (MUCINEX) 600 MG 12 hr tablet Take 1 tablet (600 mg total) by mouth 2 (two) times daily. 01/29/18   Debbe Odea, MD  ipratropium-albuterol (DUONEB) 0.5-2.5 (3) MG/3ML SOLN Take 3 mLs by nebulization every 4 (four) hours as needed (for wheezing associated with shortness of breath). 01/29/18   Debbe Odea, MD  lamoTRIgine (LAMICTAL) 100 MG tablet Take 1 tablet (100 mg total) by mouth 2 (two) times daily. Breakfast and dinner 02/28/15   Angiulli, Lavon Paganini, PA-C  levothyroxine (SYNTHROID, LEVOTHROID) 175 MCG tablet Take 175 mcg by mouth daily before breakfast.     [provider]  mirtazapine (REMERON) 7.5 MG tablet Take 15 mg by mouth at bedtime.  10/13/15   [provider]  omeprazole (PRILOSEC) 20 MG capsule Take 1 capsule (20 mg total) by mouth daily. 12/08/16   Juanito Doom, MD  polyethylene glycol (MIRALAX / GLYCOLAX) packet Take 17 g by mouth daily as needed for mild constipation. 01/29/18   Debbe Odea, MD  potassium chloride (K-DUR) 10 MEQ tablet TAKE 1 TABLET BY MOUTH EVERY DAY. KEEP UPCOMING APPOINTMENT IN JULY FOR FUTURE REFILLS. 04/07/18   Nahser, [name], MD  potassium chloride (MICRO-K) 10 MEQ CR capsule Take 10 mEq by mouth 2 (two) times daily.    [provider]  senna (SENOKOT) 8.6 MG TABS tablet Take 1 tablet (8.6 mg total) by mouth 2 (two) times daily. Patient not taking: Reported on 03/17/2018 01/29/18   Debbe Odea, MD  Tiotropium Bromide-Olodaterol (STIOLTO RESPIMAT) 2.5-2.5 MCG/ACT AERS Inhale 2 puffs into the lungs daily. 02/23/17   Juanito Doom, MD  tolnaftate (TINACTIN) 1 % powder Apply topically 2 (two) times daily. 01/29/18   Debbe Odea, MD    Allergies Patient has no known allergies.   REVIEW OF SYSTEMS  Negative except as noted here or in the History of Present Illness.   PHYSICAL EXAMINATION  Initial Vital Signs Blood pressure (!) 123/94, pulse 65, temperature 97.7 F (36.5 C), temperature source Oral, resp. rate (!) 25, height 6' (1.829 m), weight 136.1 kg, SpO2 96 %.  Examination General: Well-developed, well-nourished male in no acute distress; appearance consistent with age of record HENT: normocephalic; atraumatic Eyes: pupils equal, round and reactive to light; extraocular muscles intact Neck: supple Heart: Irregular rhythm Lungs: clear to auscultation bilaterally Abdomen: soft; nondistended; nontender; bowel sounds present Extremities: No deformity; full range of motion; asymmetric edema and chronic appearing changes of skin of lower legs, including erythema without warmth:    Neurologic: Awake, alert and oriented to person, place, day, month, year and POTUS; motor function intact in all extremities and symmetric; no facial droop Skin: Warm and dry Psychiatric: Normal mood and affect   RESULTS  Summary of this visit's results, reviewed by myself:   EKG Interpretation  Date/Time:  Wednesday April 26 2018 04:43:03 EDT Ventricular Rate:  68 PR Interval:    QRS Duration: 162 QT Interval:  461 QTC Calculation: 491 R Axis:   5 Text Interpretation:  Atrial fibrillation Left bundle branch block No significant change was found Confirmed by Quran Vasco [phone]) on 04/26/2018 4:51:37 AM      Laboratory Studies: Results for orders placed or performed during the hospital encounter of 04/26/18 (from the past 24 hour(s))  Comprehensive metabolic panel  Status: Abnormal   Collection Time: 04/26/18  4:39 AM  Result Value Ref Range   Sodium 143 135 - 145 mmol/L   Potassium 3.9 3.5 - 5.1  mmol/L   Chloride 100 98 - 111 mmol/L   CO2 31 22 - 32 mmol/L   Glucose, Bld 146 (H) 70 - 99 mg/dL   BUN 32 (H) 8 - 23 mg/dL   Creatinine, Ser 3.16 (H) 0.61 - 1.24 mg/dL   Calcium 9.5 8.9 - 10.3 mg/dL   Total Protein 7.8 6.5 - 8.1 g/dL   Albumin 4.4 3.5 - 5.0 g/dL   AST 38 15 - 41 U/L   ALT 22 0 - 44 U/L   Alkaline Phosphatase 91 38 - 126 U/L   Total Bilirubin 0.9 0.3 - 1.2 mg/dL   GFR calc non Af Amer 17 (L) >60 mL/min   GFR calc Af Amer 19 (L) >60 mL/min   Anion gap 12 5 - 15  Protime-INR     Status: None   Collection Time: 04/26/18  4:39 AM  Result Value Ref Range   Prothrombin Time 14.7 11.4 - 15.2 seconds   INR 1.16   APTT     Status: None   Collection Time: 04/26/18  4:39 AM  Result Value Ref Range   aPTT 36 24 - 36 seconds  Urinalysis, Routine w reflex microscopic     Status: Abnormal   Collection Time: 04/26/18  4:42 AM  Result Value Ref Range   Color, Urine AMBER (A) YELLOW   APPearance HAZY (A) CLEAR   Specific Gravity, Urine 1.033 (H) 1.005 - 1.030   pH 5.0 5.0 - 8.0   Glucose, UA NEGATIVE NEGATIVE mg/dL   Hgb urine dipstick NEGATIVE NEGATIVE   Bilirubin Urine MODERATE (A) NEGATIVE   Ketones, ur 5 (A) NEGATIVE mg/dL   Protein, ur 30 (A) NEGATIVE mg/dL   Nitrite NEGATIVE NEGATIVE   Leukocytes, UA TRACE (A) NEGATIVE   RBC / HPF 6-10 0 - 5 RBC/hpf   WBC, UA 21-50 0 - 5 WBC/hpf   Bacteria, UA RARE (A) NONE SEEN   Squamous Epithelial / LPF 0-5 0 - 5   Mucus PRESENT    Hyaline Casts, UA PRESENT   I-stat troponin, ED     Status: None   Collection Time: 04/26/18  4:49 AM  Result Value Ref Range   Troponin i, poc 0.03 0.00 - 0.08 ng/mL   Comment 3          I-Stat CG4 Lactic Acid, ED     Status: None   Collection Time: 04/26/18  4:50 AM  Result Value Ref Range   Lactic Acid, Venous 1.64 0.5 - 1.9 mmol/L  CBC with Differential/Platelet     Status: Abnormal   Collection Time: 04/26/18  5:03 AM  Result Value Ref Range   WBC 6.4 4.0 - 10.5 K/uL   RBC 4.05 (L)  4.22 - 5.81 MIL/uL   Hemoglobin 12.5 (L) 13.0 - 17.0 g/dL   HCT 40.2 39.0 - 52.0 %   MCV 99.3 78.0 - 100.0 fL   MCH 30.9 26.0 - 34.0 pg   MCHC 31.1 30.0 - 36.0 g/dL   RDW 15.9 (H) 11.5 - 15.5 %   Platelets 181 150 - 400 K/uL   Neutrophils Relative % 49 %   Neutro Abs 3.2 1.7 - 7.7 K/uL   Lymphocytes Relative 44 %   Lymphs Abs 2.8 0.7 - 4.0 K/uL   Monocytes Relative 5 %  Monocytes Absolute 0.3 0.1 - 1.0 K/uL   Eosinophils Relative 2 %   Eosinophils Absolute 0.1 0.0 - 0.7 K/uL   Basophils Relative 0 %   Basophils Absolute 0.0 0.0 - 0.1 K/uL   Imaging Studies: Dg Chest 2 View  Result Date: 04/26/2018 CLINICAL DATA:  84 year old male with shortness of breath and altered mental status. EXAM: CHEST - 2 VIEW COMPARISON:  Chest radiograph dated 01/27/2018 FINDINGS: No focal consolidation, pleural effusion, or pneumothorax. There is eventration of the right hemidiaphragm. Stable cardiomegaly and prominence of the aorta. Median sternotomy wires and left atrial appendage occlusive device. IMPRESSION: No active cardiopulmonary disease. Electronically Signed   By: Anner Crete M.D.   On: 04/26/2018 05:29   Ct Head Wo Contrast  Result Date: 04/26/2018 CLINICAL DATA:  84 year old male with altered mental status. EXAM: CT HEAD WITHOUT CONTRAST TECHNIQUE: Contiguous axial images were obtained from the base of the skull through the vertex without intravenous contrast. COMPARISON:  Head CT dated 12/18/2015 FINDINGS: Brain: There is moderate age-related atrophy and chronic microvascular ischemic changes. There is no acute intracranial hemorrhage. No mass effect or midline shift. No extra-axial fluid collection. Vascular: Vascular clip in the region of the left supraclinoid ICA. High attenuating appearance of the remainder of the cerebral vasculature, likely related to hemoconcentration/dehydration. Skull: Postsurgical changes of the left frontal calvarium. No acute calvarial pathology. Sinuses/Orbits:  Diffuse mucoperiosteal thickening of paranasal sinuses with complete opacification of the right maxillary sinus similar to prior CT. No air-fluid level. The mastoid air cells are clear. Other: None IMPRESSION: 1. No acute intracranial pathology. 2. Age-related atrophy and chronic microvascular ischemic changes. 3. Left supraclinoid ICA surgical clips. 4. Chronic paranasal sinus disease. Electronically Signed   By: Anner Crete M.D.   On: 04/26/2018 05:12    ED COURSE and MDM  Nursing notes and initial vitals signs, including pulse oximetry, reviewed.  Vitals:   04/26/18 0431 04/26/18 0445  BP:  (!) 123/94  Pulse:  65  Resp:  (!) 25  Temp:  97.7 F (36.5 C)  TempSrc:  Oral  SpO2:  96%  Weight: 136.1 kg   Height: 6' (1.829 m)    5:08 AM Sepsis protocol initiated due to altered mental status.  He is noted to be afebrile, mildly tachycardic but with otherwise normal vital signs.  5:42 AM Rocephin ordered for urinary tract infection, likely the cause of the patient's acute confusion.  Fluid bolus given for urinalysis suggesting dehydration and acute kidney injury.  PROCEDURES    ED DIAGNOSES     ICD-10-CM   1. Lower urinary tract infectious disease N39.0   2. Delirium R41.0   3. AKI (acute kidney injury) ([place]) N17.9        Ashlie Mcmenamy, MD 04/26/18 [phone]

## 2018-04-26 NOTE — Evaluation (Signed)
Occupational Therapy Evaluation Patient Details Name: [name] [name] MRN: [id number] DOB: [birth year] Today's Date: 04/26/2018    History of Present Illness pt was admitted for ams and acute uti.  PMH:  HTN, CKD, COPD, syncope, frequent falls, CAD and CHF,   Clinical Impression   This 84 year old man was admitted for the above.  He had difficulty answering PLOF questions and demonstrates decreased safety.  Will follow in acute setting with min A to min guard level goals.      Follow Up Recommendations  SNF    Equipment Recommendations  3 in 1 bedside commode    Recommendations for Other Services       Precautions / Restrictions Precautions Precautions: Fall Restrictions Weight Bearing Restrictions: No      Mobility Bed Mobility               General bed mobility comments: min A, extra time for in/out of bed. Assist for trunk OOB and legs for back to bed  Transfers Overall transfer level: Needs assistance Equipment used: Rolling walker (2 wheeled) Transfers: Sit to/from [employer] Sit to Stand: Min assist Stand pivot transfers: Min assist;+2 safety/equipment       General transfer comment: cues for UE placement; steadying assistance and assist to keep walker closer to him    Balance Overall balance assessment: History of Falls                                         ADL either performed or assessed with clinical judgement   ADL Overall ADL's : Needs assistance/impaired Eating/Feeding: Independent   Grooming: Set up;Oral care;Brushing hair;Sitting   Upper Body Bathing: Set up;Sitting   Lower Body Bathing: Moderate assistance;Sit to/from stand   Upper Body Dressing : Set up;Sitting   Lower Body Dressing: Maximal assistance;Sit to/from stand   Toilet Transfer: Minimal assistance;+2 for physical assistance;BSC;RW   Toileting- Clothing Manipulation and Hygiene: Minimal assistance;Sit to/from stand         General  ADL Comments: +2 safety when walking     Vision         Perception     Praxis      Pertinent Vitals/Pain Pain Assessment: Faces Faces Pain Scale: Hurts little more Pain Location: all over Pain Intervention(s): Limited activity within patient's tolerance;Monitored during session     Hand Dominance     Extremity/Trunk Assessment Upper Extremity Assessment Upper Extremity Assessment: Generalized weakness           Communication Communication Communication: (difficult to understand, mumbles)   Cognition Arousal/Alertness: Awake/alert Behavior During Therapy: WFL for tasks assessed/performed Overall Cognitive Status: No family/caregiver present to determine baseline cognitive functioning                                 General Comments: came in with AMS, stated he lives with sister. When asked if he was married, he stated yes   General Comments  on 2 liters 02; sats 98%    Exercises     Shoulder Instructions      Home Living Family/patient expects to be discharged to:: Private residence                                 Additional Comments: difficulty  answering PLOF questions:  per chart, lives with spouse and has RW.  RN has seen pt's son.        Prior Functioning/Environment          Comments: unsure of assistance at home. Per chart uses RW. Pt did state he wears sllip on tennis shoes (elastic laces) and no socks        OT Problem List: Decreased strength;Decreased activity tolerance;Impaired balance (sitting and/or standing);Pain;Decreased knowledge of use of DME or AE;Decreased cognition;Decreased safety awareness      OT Treatment/Interventions: Self-care/ADL training;DME and/or AE instruction;Balance training;Patient/family education;Cognitive remediation/compensation;Therapeutic activities;Energy conservation    OT Goals(Current goals can be found in the care plan section) Acute Rehab OT Goals Patient Stated Goal: none  stated OT Goal Formulation: With patient Time For Goal Achievement: 05/10/18 Potential to Achieve Goals: Good ADL Goals Pt Will Perform Grooming: with min guard assist;standing Pt Will Transfer to Toilet: with min guard assist;ambulating;bedside commode Pt Will Perform Toileting - Clothing Manipulation and hygiene: with min guard assist;sit to/from stand Additional ADL Goal #1: pt will not need more than 2 safety cues per session Additional ADL Goal #2: pt will complete LB adls with min A  OT Frequency: Min 2X/week   Barriers to D/C:            Co-evaluation PT/OT/SLP Co-Evaluation/Treatment: Yes Reason for Co-Treatment: For patient/therapist safety PT goals addressed during session: Mobility/safety with mobility OT goals addressed during session: ADL's and self-care      AM-PAC PT "6 Clicks" Daily Activity     Outcome Measure Help from another person eating meals?: None Help from another person taking care of personal grooming?: A Little Help from another person toileting, which includes using toliet, bedpan, or urinal?: A Little Help from another person bathing (including washing, rinsing, drying)?: A Lot Help from another person to put on and taking off regular upper body clothing?: A Little Help from another person to put on and taking off regular lower body clothing?: A Lot 6 Click Score: 17   End of Session    Activity Tolerance: Patient tolerated treatment well Patient left: in bed;with call bell/phone within reach  OT Visit Diagnosis: Unsteadiness on feet (R26.81);Muscle weakness (generalized) (M62.81)                Time: [id number] OT Time Calculation (min): 21 min Charges:  OT General Charges $OT Visit: 1 Visit OT Evaluation $OT Eval Low Complexity: 1 Low  [name] [name], OTR/L [phone] 04/26/2018  [name] [name] 04/26/2018, 4:15 PM

## 2018-04-27 ENCOUNTER — Observation Stay (HOSPITAL_COMMUNITY): Payer: Medicare Other

## 2018-04-27 DIAGNOSIS — N390 Urinary tract infection, site not specified: Secondary | ICD-10-CM | POA: Diagnosis present

## 2018-04-27 DIAGNOSIS — Z87891 Personal history of nicotine dependence: Secondary | ICD-10-CM | POA: Diagnosis not present

## 2018-04-27 DIAGNOSIS — N183 Chronic kidney disease, stage 3 (moderate): Secondary | ICD-10-CM | POA: Diagnosis present

## 2018-04-27 DIAGNOSIS — Z515 Encounter for palliative care: Secondary | ICD-10-CM | POA: Diagnosis not present

## 2018-04-27 DIAGNOSIS — G9341 Metabolic encephalopathy: Secondary | ICD-10-CM

## 2018-04-27 DIAGNOSIS — Z6841 Body Mass Index (BMI) 40.0 and over, adult: Secondary | ICD-10-CM | POA: Diagnosis not present

## 2018-04-27 DIAGNOSIS — Z79890 Hormone replacement therapy (postmenopausal): Secondary | ICD-10-CM | POA: Diagnosis not present

## 2018-04-27 DIAGNOSIS — D649 Anemia, unspecified: Secondary | ICD-10-CM | POA: Diagnosis not present

## 2018-04-27 DIAGNOSIS — N179 Acute kidney failure, unspecified: Secondary | ICD-10-CM | POA: Diagnosis present

## 2018-04-27 DIAGNOSIS — R410 Disorientation, unspecified: Secondary | ICD-10-CM | POA: Diagnosis not present

## 2018-04-27 DIAGNOSIS — Z23 Encounter for immunization: Secondary | ICD-10-CM | POA: Diagnosis present

## 2018-04-27 DIAGNOSIS — Z79899 Other long term (current) drug therapy: Secondary | ICD-10-CM | POA: Diagnosis not present

## 2018-04-27 DIAGNOSIS — Z7901 Long term (current) use of anticoagulants: Secondary | ICD-10-CM | POA: Diagnosis not present

## 2018-04-27 DIAGNOSIS — Z8249 Family history of ischemic heart disease and other diseases of the circulatory system: Secondary | ICD-10-CM | POA: Diagnosis not present

## 2018-04-27 DIAGNOSIS — Z952 Presence of prosthetic heart valve: Secondary | ICD-10-CM | POA: Diagnosis not present

## 2018-04-27 DIAGNOSIS — I482 Chronic atrial fibrillation: Secondary | ICD-10-CM | POA: Diagnosis not present

## 2018-04-27 DIAGNOSIS — F419 Anxiety disorder, unspecified: Secondary | ICD-10-CM | POA: Diagnosis present

## 2018-04-27 DIAGNOSIS — R001 Bradycardia, unspecified: Secondary | ICD-10-CM | POA: Diagnosis not present

## 2018-04-27 DIAGNOSIS — E035 Myxedema coma: Secondary | ICD-10-CM | POA: Diagnosis present

## 2018-04-27 DIAGNOSIS — R4182 Altered mental status, unspecified: Secondary | ICD-10-CM | POA: Diagnosis present

## 2018-04-27 DIAGNOSIS — G4733 Obstructive sleep apnea (adult) (pediatric): Secondary | ICD-10-CM | POA: Diagnosis present

## 2018-04-27 DIAGNOSIS — I429 Cardiomyopathy, unspecified: Secondary | ICD-10-CM | POA: Diagnosis present

## 2018-04-27 DIAGNOSIS — Z86718 Personal history of other venous thrombosis and embolism: Secondary | ICD-10-CM | POA: Diagnosis not present

## 2018-04-27 DIAGNOSIS — E7800 Pure hypercholesterolemia, unspecified: Secondary | ICD-10-CM | POA: Diagnosis present

## 2018-04-27 DIAGNOSIS — G92 Toxic encephalopathy: Secondary | ICD-10-CM | POA: Diagnosis present

## 2018-04-27 DIAGNOSIS — I130 Hypertensive heart and chronic kidney disease with heart failure and stage 1 through stage 4 chronic kidney disease, or unspecified chronic kidney disease: Secondary | ICD-10-CM | POA: Diagnosis present

## 2018-04-27 DIAGNOSIS — J449 Chronic obstructive pulmonary disease, unspecified: Secondary | ICD-10-CM | POA: Diagnosis present

## 2018-04-27 DIAGNOSIS — J9601 Acute respiratory failure with hypoxia: Secondary | ICD-10-CM | POA: Diagnosis not present

## 2018-04-27 DIAGNOSIS — J9690 Respiratory failure, unspecified, unspecified whether with hypoxia or hypercapnia: Secondary | ICD-10-CM | POA: Diagnosis not present

## 2018-04-27 DIAGNOSIS — I2510 Atherosclerotic heart disease of native coronary artery without angina pectoris: Secondary | ICD-10-CM | POA: Diagnosis present

## 2018-04-27 DIAGNOSIS — J9602 Acute respiratory failure with hypercapnia: Secondary | ICD-10-CM | POA: Diagnosis not present

## 2018-04-27 LAB — PHOSPHORUS: PHOSPHORUS: 2.8 mg/dL (ref 2.5–4.6)

## 2018-04-27 LAB — CBC WITH DIFFERENTIAL/PLATELET
BASOS PCT: 0 %
Basophils Absolute: 0 10*3/uL (ref 0.0–0.1)
EOS ABS: 0.1 10*3/uL (ref 0.0–0.7)
EOS PCT: 2 %
HCT: 39.4 % (ref 39.0–52.0)
HEMOGLOBIN: 12.4 g/dL — AB (ref 13.0–17.0)
Lymphocytes Relative: 37 %
Lymphs Abs: 2.3 10*3/uL (ref 0.7–4.0)
MCH: 30.9 pg (ref 26.0–34.0)
MCHC: 31.5 g/dL (ref 30.0–36.0)
MCV: 98.3 fL (ref 78.0–100.0)
Monocytes Absolute: 0.4 10*3/uL (ref 0.1–1.0)
Monocytes Relative: 6 %
NEUTROS PCT: 55 %
Neutro Abs: 3.4 10*3/uL (ref 1.7–7.7)
PLATELETS: 186 10*3/uL (ref 150–400)
RBC: 4.01 MIL/uL — AB (ref 4.22–5.81)
RDW: 15.7 % — ABNORMAL HIGH (ref 11.5–15.5)
WBC: 6.2 10*3/uL (ref 4.0–10.5)

## 2018-04-27 LAB — COMPREHENSIVE METABOLIC PANEL
ALK PHOS: 80 U/L (ref 38–126)
ALT: 19 U/L (ref 0–44)
ANION GAP: 9 (ref 5–15)
AST: 37 U/L (ref 15–41)
Albumin: 3.9 g/dL (ref 3.5–5.0)
BILIRUBIN TOTAL: 0.9 mg/dL (ref 0.3–1.2)
BUN: 25 mg/dL — ABNORMAL HIGH (ref 8–23)
CALCIUM: 9 mg/dL (ref 8.9–10.3)
CO2: 32 mmol/L (ref 22–32)
CREATININE: 2.34 mg/dL — AB (ref 0.61–1.24)
Chloride: 100 mmol/L (ref 98–111)
GFR, EST AFRICAN AMERICAN: 28 mL/min — AB (ref >60)
GFR, EST NON AFRICAN AMERICAN: 24 mL/min — AB (ref >60)
Glucose, Bld: 125 mg/dL — ABNORMAL HIGH (ref 70–99)
Potassium: 4.2 mmol/L (ref 3.5–5.1)
Sodium: 141 mmol/L (ref 135–145)
TOTAL PROTEIN: 7.1 g/dL (ref 6.5–8.1)

## 2018-04-27 LAB — MAGNESIUM: Magnesium: 2.1 mg/dL (ref 1.7–2.4)

## 2018-04-27 LAB — URINE CULTURE: Culture: NO GROWTH

## 2018-04-27 LAB — CORTISOL: Cortisol, Plasma: 17 ug/dL

## 2018-04-27 LAB — T4, FREE: Free T4: 0.2 ng/dL — ABNORMAL LOW (ref 0.82–1.77)

## 2018-04-27 LAB — TSH: TSH: 138.414 u[IU]/mL — AB (ref 0.350–4.500)

## 2018-04-27 MED ORDER — HYDRALAZINE HCL 20 MG/ML IJ SOLN
10.0000 mg | INTRAMUSCULAR | Status: DC | PRN
  Administered 2018-04-27 – 2018-04-29 (×2): 10 mg via INTRAVENOUS
  Filled 2018-04-27 (×2): qty 1

## 2018-04-27 MED ORDER — IPRATROPIUM-ALBUTEROL 0.5-2.5 (3) MG/3ML IN SOLN
3.0000 mL | Freq: Four times a day (QID) | RESPIRATORY_TRACT | Status: DC
  Administered 2018-04-27 – 2018-05-01 (×14): 3 mL via RESPIRATORY_TRACT
  Filled 2018-04-27 (×13): qty 3

## 2018-04-27 MED ORDER — HALOPERIDOL LACTATE 5 MG/ML IJ SOLN
2.0000 mg | Freq: Four times a day (QID) | INTRAMUSCULAR | Status: DC | PRN
  Administered 2018-04-27 – 2018-04-28 (×2): 2 mg via INTRAVENOUS
  Filled 2018-04-27 (×2): qty 1

## 2018-04-27 MED ORDER — TAMSULOSIN HCL 0.4 MG PO CAPS
0.4000 mg | ORAL_CAPSULE | Freq: Every day | ORAL | Status: DC
  Administered 2018-04-27 – 2018-05-09 (×11): 0.4 mg via ORAL
  Filled 2018-04-27 (×11): qty 1

## 2018-04-27 MED ORDER — LORAZEPAM 2 MG/ML IJ SOLN
1.0000 mg | Freq: Once | INTRAMUSCULAR | Status: AC
  Administered 2018-04-27: 1 mg via INTRAVENOUS
  Filled 2018-04-27: qty 1

## 2018-04-27 MED ORDER — LACTATED RINGERS IV SOLN
INTRAVENOUS | Status: DC
  Administered 2018-04-27 – 2018-04-29 (×9): via INTRAVENOUS

## 2018-04-27 MED ORDER — LEVOTHYROXINE SODIUM 100 MCG IV SOLR
100.0000 ug | Freq: Every day | INTRAVENOUS | Status: DC
  Administered 2018-04-27 – 2018-04-28 (×2): 100 ug via INTRAVENOUS
  Filled 2018-04-27 (×2): qty 5

## 2018-04-27 MED ORDER — METHYLPREDNISOLONE SODIUM SUCC 40 MG IJ SOLR
40.0000 mg | Freq: Once | INTRAMUSCULAR | Status: AC
  Administered 2018-04-27: 40 mg via INTRAVENOUS
  Filled 2018-04-27: qty 1

## 2018-04-27 NOTE — Progress Notes (Signed)
[facility] Progress Note  Patient: [name] ClusterRobert [name] OZH:[id number]   PCP: Henrine Screwshacker, Bronson, MD DOB: 03-17-1932   DOA: 04/26/2018   DOS: 04/27/2018   Date of Service: the patient was seen and examined on 04/27/2018  Subjective: Patient is more confused and agitated.  Received IV Ativan last night.  Also requires mittens as he is constantly trying to prolong his IVs.  No nausea no vomiting.  Unable to follow any commands.  Brief hospital course: Pt. with PMH of HTN, HLD, paroxysmal A. fib S/P left atrial appendage device placement, aortic insufficiency, DVT, anticoagulation with Eliquis, COPD, OSA, CKD 3; admitted on 04/26/2018, presented with complaint of confusion, was found to have acute kidney injury as well as polypharmacy. Currently further plan is continue to treat confusion.  Assessment and Plan: 1.  Acute metabolic and toxic encephalopathy. History of dementia UTI  Patient presents with severe confusion, he drove off and went to [place]  A silver alert was called for the patient he was found at 2 AM. Patient is still confused this morning on 04/27/2018 but likely this is secondary to Ativan he received last night. Renal function is getting better but was still not back to baseline. Medications are adjusted and Soma on hold. TSH significantly elevated and Synthroid dose was increased. Will recheck cortisol level TSH and free T4. Provide IV Solu-Medrol, IV Synthroid. Check bladder scan. Do not use benzodiazepine on this patient. PRN Haldol ordered. Per family he has been confused lately and it has been getting worse over the last few months. This is likely undiagnosed progressive dementia. CT head negative. UA shows possible UTI and therefore patient is on IV ceftriaxone.  2.  Hypothyroidism. Patient is on Synthroid 175 MCG at home. TSH still significantly elevated with 154 level value. Rechecking TSH and free T4. Dose increased to 200  MCG. We will also provide Solu-Medrol x1. Not sure whether the patient is taking his medications regularly and consistently are normal. Recommend rechecking TSH as well as recommend outpatient establishing care with endocrinologist.  3.  Anxiety and mood disorder. Patient is on Klonopin, Wellbutrin, BuSpar, Lexapro, Remeron, Lamictal. I suspect that this many medications are too much for a patient of 84 year old. Recommend to discontinue Klonopin on discharge, also discontinue BuSpar on discharge. Continue Wellbutrin and Lexapro and Lamictal. Would also prefer to discontinue Remeron for now.  4.  BPH. Patient is on dutasteride. Flomax added. Check bladder scan.  5.  GERD. On PPI we will continue that.  6.  COPD. Continuing home inhalers.  Adding PRN DuoNeb's.  7.  History of DVT. History of paroxysmal A. fib. S/P left atrial appendage device placement. Continue Eliquis.  Diet: Regular diet DVT Prophylaxis: on therapeutic anticoagulation.  Advance goals of care discussion: Full code  Family Communication: no family was present at bedside, at the time of interview.   Disposition:  Discharge to be determined.  Consultants: none Procedures: none  Antibiotics: Anti-infectives (From admission, onward)   Start     Dose/Rate Route Frequency Ordered Stop   04/27/18 0600  cefTRIAXone (ROCEPHIN) 1 g in sodium chloride 0.9 % 100 mL IVPB     1 g 200 mL/hr over 30 Minutes Intravenous Every 24 hours 04/26/18 0638     04/26/18 0530  cefTRIAXone (ROCEPHIN) 1 g in sodium chloride 0.9 % 100 mL IVPB     1 g 200 mL/hr over 30 Minutes Intravenous  Once 04/26/18 0522 04/26/18 [id number]  Objective: Physical Exam: Vitals:   04/26/18 2031 04/26/18 2202 04/27/18 0511 04/27/18 0750  BP: (!) 149/108 (!) 142/89 (!) 136/106 (!) 138/125  Pulse: 68 70 88 83  Resp:  20 16 20   Temp:  97.7 F (36.5 C) (!) 97.4 F (36.3 C)   TempSrc:  Oral Oral   SpO2:  95% 91% 98%  Weight:   (!) 140 kg     Height:        Intake/Output Summary (Last 24 hours) at 04/27/2018 0848 Last data filed at 04/27/2018 0526 Gross per 24 hour  Intake 445.65 ml  Output 650 ml  Net -204.35 ml   Filed Weights   04/26/18 0431 04/26/18 0704 04/27/18 0511  Weight: 136.1 kg (!) 139.1 kg (!) 140 kg   General: Alert, Awake and Oriented to erson. Appear in mild distress, affect irritable Eyes: PERRL, Conjunctiva normal ENT: Oral Mucosa clear dry. Neck: difficult to assess  JVD, no Abnormal Mass Or lumps Cardiovascular: S1 and S2 Present, aortic systolic  Murmur, Peripheral Pulses Present Respiratory: normal respiratory effort, Bilateral Air entry equal and Decreased, no use of accessory muscle, no Crackles, bilateral expiratory  wheezes Abdomen: Bowel Sound present, Soft and no tenderness, no hernia Skin: no redness, no Rash, no induration Extremities: no Pedal edema, no calf tenderness Neurologic: Grossly no focal neuro deficit. Bilaterally Equal motor strength  Data Reviewed: CBC: Recent Labs  Lab 04/26/18 0503 04/27/18 0416  WBC 6.4 6.2  NEUTROABS 3.2 3.4  HGB 12.5* 12.4*  HCT 40.2 39.4  MCV 99.3 98.3  PLT 181 186   Basic Metabolic Panel: Recent Labs  Lab 04/26/18 0439 04/27/18 0416  NA 143 141  K 3.9 4.2  CL 100 100  CO2 31 32  GLUCOSE 146* 125*  BUN 32* 25*  CREATININE 3.16* 2.34*  CALCIUM 9.5 9.0  MG  --  2.1  PHOS  --  2.8    Liver Function Tests: Recent Labs  Lab 04/26/18 0439 04/27/18 0416  AST 38 37  ALT 22 19  ALKPHOS 91 80  BILITOT 0.9 0.9  PROT 7.8 7.1  ALBUMIN 4.4 3.9   No results for input(s): LIPASE, AMYLASE in the last 168 hours. No results for input(s): AMMONIA in the last 168 hours. Coagulation Profile: Recent Labs  Lab 04/26/18 0439  INR 1.16   Cardiac Enzymes: No results for input(s): CKTOTAL, CKMB, CKMBINDEX, TROPONINI in the last 168 hours. BNP (last 3 results) No results for input(s): PROBNP in the last 8760 hours. CBG: Recent Labs  Lab  04/26/18 0440  GLUCAP 136*   Studies: US Renal  Result Date: 04/26/2018 CLINICAL DATA:  Acute renal failure EXAM: RENAL / URINARY TRACT ULTRASOUND COMPLETE COMPARISON:  None. FINDINGS: Right Kidney: Length: 6.8 cm. Echogenicity within normal limits. No mass or hydronephrosis visualized. Left Kidney: Not well visualized Bladder: Appears normal for degree of bladder distention. IMPRESSION: Somewhat limited exam due the patient's body habitus. Left kidney is not well visualized. No hydronephrosis on the right is seen. Electronically Signed   By: Alcide Clever M.D.   On: 04/26/2018 10:56    Scheduled Meds: . apixaban  2.5 mg Oral BID  . atorvastatin  10 mg Oral Daily  . buPROPion  300 mg Oral Daily  . carvedilol  25 mg Oral BID WC  . dutasteride  0.5 mg Oral Daily  . escitalopram  10 mg Oral Daily  . folic acid  1 mg Oral Daily  . ipratropium-albuterol  3 mL Nebulization Q6H  .  lamoTRIgine  100 mg Oral QPC supper  . lamoTRIgine  200 mg Oral QPC breakfast  . levothyroxine  100 mcg Intravenous Daily  . pantoprazole  40 mg Oral Daily  . tamsulosin  0.4 mg Oral QPC supper   Continuous Infusions: . cefTRIAXone (ROCEPHIN)  IV 1 g (04/27/18 0537)  . lactated ringers     PRN Meds: acetaminophen **OR** acetaminophen, albuterol, haloperidol lactate  Time spent: 35 minutes  Author: Lynden Oxford, MD [facility] Pager: [phone] 04/27/2018 8:48 AM  If 7PM-7AM, please contact night-coverage at [email or web address], password [email or web address]

## 2018-04-27 NOTE — Progress Notes (Signed)
This morning patient was very lethargic and very confused. Patient could hardly keep his eyes open and his speech was very slurred. VS were all stable other than blood pressure running high. MD was made aware. Patient did receive a one time dose of Ativan during the night last night which MD believes caused him to have the opposite reaction. MD said he did not want him to get ativan again. Patient does have haldol PRN on his list if needed now. When son came in this afternoon he let RN know that patient normally takes Klonopin for anxiety. Patient is finally waking up and becoming more alert around dinner time. He has been able to eat dinner and take his PO flomax. Earlier today patient did see to be retaining fluid and bladder scan showed 506 at most, RN was ordered to do a in-and-out cath and got 900ml out. MD asked RN to bladder scan patient again and if volume was over 300 to place a foley catheter due to patients history of BPH. Patient's last bladder scan around 1845 was only 229. RN will give report to nightshift RN to monitor patient's LOC and urine output.  Rockne Coonsorcoran, Kambrea Carrasco C, RN

## 2018-04-28 ENCOUNTER — Inpatient Hospital Stay (HOSPITAL_COMMUNITY): Payer: Medicare Other

## 2018-04-28 ENCOUNTER — Encounter (HOSPITAL_COMMUNITY): Payer: Self-pay | Admitting: Radiology

## 2018-04-28 LAB — BLOOD GAS, ARTERIAL
Acid-Base Excess: 4.3 mmol/L — ABNORMAL HIGH (ref 0.0–2.0)
Bicarbonate: 29.2 mmol/L — ABNORMAL HIGH (ref 20.0–28.0)
Drawn by: 317771
O2 Content: 2 L/min
O2 Saturation: 94.9 %
Patient temperature: 98.6
pCO2 arterial: 46.5 mmHg (ref 32.0–48.0)
pH, Arterial: 7.414 (ref 7.350–7.450)
pO2, Arterial: 76.4 mmHg — ABNORMAL LOW (ref 83.0–108.0)

## 2018-04-28 LAB — RENAL FUNCTION PANEL
Albumin: 3.8 g/dL (ref 3.5–5.0)
Anion gap: 11 (ref 5–15)
BUN: 19 mg/dL (ref 8–23)
CO2: 29 mmol/L (ref 22–32)
Calcium: 8.8 mg/dL — ABNORMAL LOW (ref 8.9–10.3)
Chloride: 98 mmol/L (ref 98–111)
Creatinine, Ser: 2.19 mg/dL — ABNORMAL HIGH (ref 0.61–1.24)
GFR calc Af Amer: 30 mL/min — ABNORMAL LOW
GFR calc non Af Amer: 26 mL/min — ABNORMAL LOW
Glucose, Bld: 153 mg/dL — ABNORMAL HIGH (ref 70–99)
Phosphorus: 2.9 mg/dL (ref 2.5–4.6)
Potassium: 4.3 mmol/L (ref 3.5–5.1)
Sodium: 138 mmol/L (ref 135–145)

## 2018-04-28 MED ORDER — HALOPERIDOL LACTATE 5 MG/ML IJ SOLN
5.0000 mg | Freq: Four times a day (QID) | INTRAMUSCULAR | Status: DC | PRN
  Administered 2018-04-28 – 2018-05-03 (×5): 5 mg via INTRAVENOUS
  Filled 2018-04-28 (×5): qty 1

## 2018-04-28 MED ORDER — RISPERIDONE 0.5 MG PO TABS
0.5000 mg | ORAL_TABLET | Freq: Two times a day (BID) | ORAL | Status: DC
  Administered 2018-04-28 (×2): 0.5 mg via ORAL
  Filled 2018-04-28 (×3): qty 1

## 2018-04-28 MED ORDER — LEVOTHYROXINE SODIUM 100 MCG PO TABS
200.0000 ug | ORAL_TABLET | Freq: Every day | ORAL | Status: DC
  Administered 2018-04-29: 200 ug via ORAL
  Filled 2018-04-28: qty 2

## 2018-04-28 NOTE — Progress Notes (Signed)
Placed on CPAP as ordered, however, pt. Is combative, pinching, hitting and attempting to kick myself and the sitter.  Pt. Had mask on for less than 65 minutes then he ripped it off.  Placed back on [place] 2 lpm.

## 2018-04-28 NOTE — Progress Notes (Signed)
OT Cancellation Note  Patient Details Name: [name] ClusterRobert [name] MRN: [id number] DOB: Dec 13, 1931   Cancelled Treatment:    Reason Eval/Treat Not Completed: Other (comment). Pt not appropriate at this time; talking to people who are not in room. Will check back another day.  Palak Tercero 04/28/2018, 11:09 AM  Marica OtterMaryellen Nyree Applegate, OTR/L [phone] 04/28/2018

## 2018-04-28 NOTE — Progress Notes (Signed)
Patient was very agitated and confuse all through the night. Haldol x2 given, patient bath. Non effective. Patient broke portable fan, pull oxygen tubing off the wall, pull iv out, continuously  removing telemetry and oxygen via [place]. Due to patient safety, Sitter provided.

## 2018-04-28 NOTE — Progress Notes (Signed)
PT Cancellation Note  Patient Details Name: [name] [name] MRN: [id number] DOB: Aug 23, 1932   Cancelled Treatment:    Reason Eval/Treat Not Completed: Other (comment) RN reports pt not appropriate for therapy at this time. Pt confused, restless.  Will check back as schedule permits.   Michall Noffke,KATHrine E 04/28/2018, 3:45 PM Zenovia JarredKati Haydee Jabbour, PT, DPT 04/28/2018 Pager: [phone]

## 2018-04-28 NOTE — Progress Notes (Signed)
Patient bladder scan this morning, zero residual.

## 2018-04-28 NOTE — Progress Notes (Signed)
[facility] Progress Note  Patient: [name] [name] WUJ:[id number]   PCP: Henrine Screws, MD DOB: May 23, 1932   DOA: 04/26/2018   DOS: 04/28/2018   Date of Service: the patient was seen and examined on 04/28/2018  Subjective: Patient is still confused and agitated.  Talking tangentially.  But more awake as compared to yesterday.  Brief hospital course: Pt. with PMH of HTN, HLD, paroxysmal A. fib S/P left atrial appendage device placement, aortic insufficiency, DVT, anticoagulation with Eliquis, COPD, OSA, CKD 3; admitted on 04/26/2018, presented with complaint of confusion, was found to have acute kidney injury as well as polypharmacy. Currently further plan is continue to treat confusion.  Assessment and Plan: 1.  Acute metabolic and toxic encephalopathy. History of dementia UTI  Patient presents with severe confusion, he drove off and went to [place] called his son that he was at [employer].  A silver alert was called for the patient he was found at 2 AM. Patient is still confused this morning on 04/27/2018 but likely this is secondary to Ativan he received last night. Renal function is getting better but was still not back to baseline. Medications are adjusted and Soma on hold. TSH significantly elevated and Synthroid dose was increased. Will recheck cortisol level TSH and free T4. Provided IV Solu-Medrol, IV Synthroid.  Increase dose Synthroid. bladder scan now unremarkable. Do not use benzodiazepine on this patient. PRN Haldol ordered. Per family he has been confused lately and it has been getting worse over the last few months. This is likely undiagnosed progressive dementia. CT head negative. UA shows possible UTI and therefore patient is on IV ceftriaxone. Repeat CT head unremarkable. ABG also shows no evidence of hypercarbia. CPAP nightly.  2.  Hypothyroidism. Patient is on Synthroid 175 MCG at home. TSH still significantly elevated with 154 level value. Rechecking TSH and  free T4. Dose increased to 200 MCG. We will also provide Solu-Medrol x1. Not sure whether the patient is taking his medications regularly and consistently are normal. Recommend rechecking TSH as well as recommend outpatient establishing care with endocrinologist.  3.  Anxiety and mood disorder. Patient is on Klonopin, Wellbutrin, BuSpar, Lexapro, Remeron, Lamictal. I suspect that this many medications are too much for a patient of 84 year old. Recommend to discontinue Klonopin on discharge, also discontinue BuSpar on discharge. Currently holding but would like to resume when possible Wellbutrin and Lexapro and Lamictal. Would also prefer to discontinue Remeron for now.  4.  BPH. Patient is on dutasteride. Flomax added. PRN bladder scan.  5.  GERD. On PPI we will continue that.  6.  COPD. Continuing home inhalers.  Adding PRN DuoNeb's.  7.  History of DVT. History of paroxysmal A. fib. S/P left atrial appendage device placement. Continue Eliquis.  Diet: Regular diet DVT Prophylaxis: on therapeutic anticoagulation.  Advance goals of care discussion: Full code  Family Communication: no family was present at bedside, at the time of interview.   Disposition:  Discharge to be determined.  Consultants: none Procedures: none  Antibiotics: Anti-infectives (From admission, onward)   Start     Dose/Rate Route Frequency Ordered Stop   04/27/18 0600  cefTRIAXone (ROCEPHIN) 1 g in sodium chloride 0.9 % 100 mL IVPB     1 g 200 mL/hr over 30 Minutes Intravenous Every 24 hours 04/26/18 0638     04/26/18 0530  cefTRIAXone (ROCEPHIN) 1 g in sodium chloride 0.9 % 100 mL IVPB     1 g 200 mL/hr over 30 Minutes Intravenous  Once 04/26/18 0522 04/26/18 0653       Objective: Physical Exam: Vitals:   04/28/18 0604 04/28/18 0629 04/28/18 0807 04/28/18 1350  BP:  (!) 124/101  (!) 162/105  Pulse:  82  86  Resp:  18    Temp:  (!) 97.4 F (36.3 C)  97.6 F (36.4 C)  TempSrc:     Axillary  SpO2:  96% 94% 96%  Weight: 74.2 kg     Height:        Intake/Output Summary (Last 24 hours) at 04/28/2018 1551 Last data filed at 04/28/2018 0654 Gross per 24 hour  Intake 954.82 ml  Output 1477 ml  Net -522.18 ml   Filed Weights   04/26/18 0704 04/27/18 0511 04/28/18 0604  Weight: (!) 139.1 kg (!) 140 kg 74.2 kg   General: Alert, Awake and Oriented to erson. Appear in mild distress, affect irritable Eyes: PERRL, Conjunctiva normal ENT: Oral Mucosa clear dry. Neck: difficult to assess  JVD, no Abnormal Mass Or lumps Cardiovascular: S1 and S2 Present, aortic systolic  Murmur, Peripheral Pulses Present Respiratory: normal respiratory effort, Bilateral Air entry equal and Decreased, no use of accessory muscle, no Crackles, bilateral expiratory  wheezes Abdomen: Bowel Sound present, Soft and no tenderness, no hernia Skin: no redness, no Rash, no induration Extremities: no Pedal edema, no calf tenderness Neurologic: Grossly no focal neuro deficit. Bilaterally Equal motor strength  Data Reviewed: CBC: Recent Labs  Lab 04/26/18 0503 04/27/18 0416  WBC 6.4 6.2  NEUTROABS 3.2 3.4  HGB 12.5* 12.4*  HCT 40.2 39.4  MCV 99.3 98.3  PLT 181 186   Basic Metabolic Panel: Recent Labs  Lab 04/26/18 0439 04/27/18 0416 04/28/18 0412  NA 143 141 138  K 3.9 4.2 4.3  CL 100 100 98  CO2 31 32 29  GLUCOSE 146* 125* 153*  BUN 32* 25* 19  CREATININE 3.16* 2.34* 2.19*  CALCIUM 9.5 9.0 8.8*  MG  --  2.1  --   PHOS  --  2.8 2.9    Liver Function Tests: Recent Labs  Lab 04/26/18 0439 04/27/18 0416 04/28/18 0412  AST 38 37  --   ALT 22 19  --   ALKPHOS 91 80  --   BILITOT 0.9 0.9  --   PROT 7.8 7.1  --   ALBUMIN 4.4 3.9 3.8   No results for input(s): LIPASE, AMYLASE in the last 168 hours. No results for input(s): AMMONIA in the last 168 hours. Coagulation Profile: Recent Labs  Lab 04/26/18 0439  INR 1.16   Cardiac Enzymes: No results for input(s): CKTOTAL,  CKMB, CKMBINDEX, TROPONINI in the last 168 hours. BNP (last 3 results) No results for input(s): PROBNP in the last 8760 hours. CBG: Recent Labs  Lab 04/26/18 0440  GLUCAP 136*   Studies: Ct Head Wo Contrast  Result Date: 04/28/2018 CLINICAL DATA:  84 year old male with altered mental status. Agitation and confusion. EXAM: CT HEAD WITHOUT CONTRAST TECHNIQUE: Contiguous axial images were obtained from the base of the skull through the vertex without intravenous contrast. COMPARISON:  Head CT without contrast 04/26/2018 and earlier. FINDINGS: Brain: Stable cerebral volume. Confluent bilateral cerebral white matter hypodensity with bilateral deep gray matter heterogeneity. Stable gray-white matter differentiation throughout the brain. No midline shift, ventriculomegaly, mass effect, evidence of mass lesion, intracranial hemorrhage or evidence of cortically based acute infarction. Vascular: Ectatic right ICA siphon with surgically clipped left ICA. Skull: No acute osseous abnormality identified. Sinuses/Orbits: Chronic right maxillary sinusitis. A  small fluid level in the right maxillary sinus has increased. Otherwise stable sinus pneumatization. Tympanic cavities and mastoids remain clear. Other: No acute orbit or scalp soft tissue findings. IMPRESSION: 1.  No acute intracranial abnormality. 2. Stable non contrast CT appearance of the brain: Advanced chronic small vessel disease and previous surgical clipping of the left ICA siphon. 3. Mild acute on chronic paranasal sinusitis. Electronically Signed   By: Odessa Fleming M.D.   On: 04/28/2018 11:58    Scheduled Meds: . apixaban  2.5 mg Oral BID  . carvedilol  25 mg Oral BID WC  . ipratropium-albuterol  3 mL Nebulization Q6H  . [START ON 04/29/2018] levothyroxine  200 mcg Oral QAC breakfast  . risperiDONE  0.5 mg Oral BID  . tamsulosin  0.4 mg Oral QPC supper   Continuous Infusions: . cefTRIAXone (ROCEPHIN)  IV 200 mL/hr at 04/28/18 0654  . lactated  ringers 100 mL/hr at 04/28/18 0947   PRN Meds: acetaminophen **OR** acetaminophen, albuterol, haloperidol lactate, hydrALAZINE  Time spent: 35 minutes  Author: Lynden Oxford, MD [facility] Pager: [phone] 04/28/2018 3:51 PM  If 7PM-7AM, please contact night-coverage at [email or web address], password [facility]

## 2018-04-29 ENCOUNTER — Inpatient Hospital Stay (HOSPITAL_COMMUNITY): Payer: Medicare Other

## 2018-04-29 LAB — RENAL FUNCTION PANEL
ALBUMIN: 3.6 g/dL (ref 3.5–5.0)
Anion gap: 11 (ref 5–15)
BUN: 18 mg/dL (ref 8–23)
CO2: 30 mmol/L (ref 22–32)
Calcium: 8.4 mg/dL — ABNORMAL LOW (ref 8.9–10.3)
Chloride: 97 mmol/L — ABNORMAL LOW (ref 98–111)
Creatinine, Ser: 2.06 mg/dL — ABNORMAL HIGH (ref 0.61–1.24)
GFR calc Af Amer: 32 mL/min — ABNORMAL LOW (ref >60)
GFR calc non Af Amer: 28 mL/min — ABNORMAL LOW (ref >60)
GLUCOSE: 113 mg/dL — AB (ref 70–99)
PHOSPHORUS: 3.2 mg/dL (ref 2.5–4.6)
POTASSIUM: 4.2 mmol/L (ref 3.5–5.1)
SODIUM: 138 mmol/L (ref 135–145)

## 2018-04-29 MED ORDER — LAMOTRIGINE 100 MG PO TABS
100.0000 mg | ORAL_TABLET | Freq: Two times a day (BID) | ORAL | Status: DC
  Administered 2018-04-29 (×2): 100 mg via ORAL
  Filled 2018-04-29: qty 1

## 2018-04-29 MED ORDER — CEPHALEXIN 500 MG PO CAPS
500.0000 mg | ORAL_CAPSULE | Freq: Two times a day (BID) | ORAL | Status: DC
  Administered 2018-04-29 – 2018-05-03 (×9): 500 mg via ORAL
  Filled 2018-04-29 (×11): qty 1

## 2018-04-29 MED ORDER — RISPERIDONE 1 MG PO TABS
1.0000 mg | ORAL_TABLET | Freq: Every day | ORAL | Status: DC
  Administered 2018-04-30 – 2018-05-02 (×3): 1 mg via ORAL
  Filled 2018-04-29 (×3): qty 1

## 2018-04-29 MED ORDER — RISPERIDONE 0.5 MG PO TABS
0.5000 mg | ORAL_TABLET | Freq: Every day | ORAL | Status: DC
  Administered 2018-04-29 – 2018-05-03 (×5): 0.5 mg via ORAL
  Filled 2018-04-29 (×4): qty 1

## 2018-04-29 NOTE — Progress Notes (Signed)
[facility] Progress Note  Patient: [name] ClusterRobert [name] ZOX:[id number]   PCP: Henrine Screwshacker, Nicoles, MD DOB: 21-May-1932   DOA: 04/26/2018   DOS: 04/29/2018   Date of Service: the patient was seen and examined on 04/29/2018  Subjective: Patient is still agitated. Confusion has improved, per family still not at baseline. Occasionally Talking tangentially.    Brief hospital course: Pt. with PMH of HTN, HLD, paroxysmal A. fib S/P left atrial appendage device placement, aortic insufficiency, DVT, anticoagulation with Eliquis, COPD, OSA, CKD 3; admitted on 04/26/2018, presented with complaint of confusion, was found to have acute kidney injury as well as polypharmacy. Currently further plan is continue to treat confusion.  Assessment and Plan: 1.  Acute metabolic and toxic encephalopathy. History of dementia UTI  Patient presents with severe confusion, [place] drove off and went [place]  A silver alert was called for the patient he was found at 2 AM. Patient is still confused this morning on 04/27/2018 but likely this is secondary to Ativan he received last night. Renal function is getting better but was still not back to baseline. Medications are adjusted and [place] on hold. TSH significantly elevated and Synthroid dose was increased. Will recheck cortisol level TSH and free T4. Provided IV Solu-Medrol, IV Synthroid.  Increase dose Synthroid. bladder scan now unremarkable. Do not use benzodiazepine on this patient. PRN Haldol ordered. Per family he has been confused lately and it has been getting worse over the last few months. This is likely undiagnosed progressive dementia. CT head negative. UA shows possible UTI and therefore patient is on IV ceftriaxone. Switch to oral keflex, urine culture negative.  Repeat CT head unremarkable. ABG also shows no evidence of hypercarbia. Unable to tolerate CPAP nightly.  2.  Hypothyroidism. Patient is on Synthroid 175 MCG  at home. TSH still significantly elevated with 154 level value. Rechecking TSH and free T4. Dose increased to 200 MCG. We will also provide Solu-Medrol x1. Not sure whether the patient is taking his medications regularly and consistently are normal. Recommend rechecking TSH as well as recommend outpatient establishing care with endocrinologist.  3.  Anxiety and mood disorder. Patient is on Klonopin, Wellbutrin, BuSpar, Lexapro, Remeron, Lamictal. I suspect that this many medications are too much for a patient of 84 year old. Recommend to discontinue Klonopin on discharge, also discontinue BuSpar on discharge. Currently holding but would like to resume when possible Wellbutrin and Lexapro Resume Lamictal at lower dose 100 bid.  Increased risperidone at night.  Would also prefer to discontinue Remeron for now.  4.  BPH. Patient is on dutasteride. Flomax added. PRN bladder scan.  5.  GERD. On PPI we will continue that.  6.  COPD. Continuing home inhalers.  Adding PRN DuoNeb's.  7.  History of DVT. History of paroxysmal A. fib. S/P left atrial appendage device placement. Continue Eliquis.  Diet: Regular diet DVT Prophylaxis: on therapeutic anticoagulation.  Advance goals of care discussion: Full code  Family Communication: no family was present at bedside, at the time of interview.   Disposition:  Discharge to be determined.  Consultants: none Procedures: none  Antibiotics: Anti-infectives (From admission, onward)   Start     Dose/Rate Route Frequency Ordered Stop   04/29/18 1000  cephALEXin (KEFLEX) capsule 500 mg     500 mg Oral Every 12 hours 04/29/18 0841     04/27/18 0600  cefTRIAXone (ROCEPHIN) 1 g in sodium chloride 0.9 % 100 mL IVPB  Status:  Discontinued     1 g 200 mL/hr over 30 Minutes Intravenous Every 24 hours 04/26/18 0638 04/29/18 0840   04/26/18 0530  cefTRIAXone (ROCEPHIN) 1 g in sodium chloride 0.9 % 100 mL IVPB     1 g 200 mL/hr over 30 Minutes  Intravenous  Once 04/26/18 0522 04/26/18 0653       Objective: Physical Exam: Vitals:   04/28/18 2019 04/28/18 2259 04/29/18 0430 04/29/18 0500  BP:  (!) 158/87 (!) 154/105 (!) 163/108  Pulse:  69 83 68  Resp:  14 16   Temp:  (!) 96.7 F (35.9 C) (!) 97.4 F (36.3 C)   TempSrc:   Axillary   SpO2: 96% 98% 96%   Weight:    (!) 141.9 kg  Height:        Intake/Output Summary (Last 24 hours) at 04/29/2018 1125 Last data filed at 04/29/2018 1122 Gross per 24 hour  Intake 2518.74 ml  Output 400 ml  Net 2118.74 ml   Filed Weights   04/27/18 0511 04/28/18 0604 04/29/18 0500  Weight: (!) 140 kg 74.2 kg (!) 141.9 kg   General: Alert, Awake and Oriented to erson. Appear in mild distress, affect irritable Eyes: PERRL, Conjunctiva normal ENT: Oral Mucosa clear dry. Neck: difficult to assess  JVD, no Abnormal Mass Or lumps Cardiovascular: S1 and S2 Present, aortic systolic  Murmur, Peripheral Pulses Present Respiratory: normal respiratory effort, Bilateral Air entry equal and Decreased, no use of accessory muscle, no Crackles, bilateral expiratory  wheezes Abdomen: Bowel Sound present, Soft and no tenderness, no hernia Skin: no redness, no Rash, no induration Extremities: no Pedal edema, no calf tenderness Neurologic: Grossly no focal neuro deficit. Bilaterally Equal motor strength  Data Reviewed: CBC: Recent Labs  Lab 04/26/18 0503 04/27/18 0416  WBC 6.4 6.2  NEUTROABS 3.2 3.4  HGB 12.5* 12.4*  HCT 40.2 39.4  MCV 99.3 98.3  PLT 181 186   Basic Metabolic Panel: Recent Labs  Lab 04/26/18 0439 04/27/18 0416 04/28/18 0412 04/29/18 0348  NA 143 141 138 138  K 3.9 4.2 4.3 4.2  CL 100 100 98 97*  CO2 31 32 29 30  GLUCOSE 146* 125* 153* 113*  BUN 32* 25* 19 18  CREATININE 3.16* 2.34* 2.19* 2.06*  CALCIUM 9.5 9.0 8.8* 8.4*  MG  --  2.1  --   --   PHOS  --  2.8 2.9 3.2    Liver Function Tests: Recent Labs  Lab 04/26/18 0439 04/27/18 0416 04/28/18 0412  04/29/18 0348  AST 38 37  --   --   ALT 22 19  --   --   ALKPHOS 91 80  --   --   BILITOT 0.9 0.9  --   --   PROT 7.8 7.1  --   --   ALBUMIN 4.4 3.9 3.8 3.6   No results for input(s): LIPASE, AMYLASE in the last 168 hours. No results for input(s): AMMONIA in the last 168 hours. Coagulation Profile: Recent Labs  Lab 04/26/18 0439  INR 1.16   Cardiac Enzymes: No results for input(s): CKTOTAL, CKMB, CKMBINDEX, TROPONINI in the last 168 hours. BNP (last 3 results) No results for input(s): PROBNP in the last 8760 hours. CBG: Recent Labs  Lab 04/26/18 0440  GLUCAP 136*   Studies: No results found.  Scheduled Meds: . apixaban  2.5 mg Oral BID  . carvedilol  25 mg Oral BID WC  . cephALEXin  500 mg Oral Q12H  .  ipratropium-albuterol  3 mL Nebulization Q6H  . lamoTRIgine  100 mg Oral BID  . levothyroxine  200 mcg Oral QAC breakfast  . risperiDONE  0.5 mg Oral Daily  . [START ON 04/30/2018] risperiDONE  1 mg Oral QHS  . tamsulosin  0.4 mg Oral QPC supper   Continuous Infusions:  PRN Meds: acetaminophen **OR** acetaminophen, albuterol, haloperidol lactate, hydrALAZINE  Time spent: 35 minutes  Author: Lynden Oxford, MD [facility] Pager: [phone] 04/29/2018 11:25 AM  If 7PM-7AM, please contact night-coverage at [email or web address], password [facility]

## 2018-04-30 LAB — RENAL FUNCTION PANEL
Albumin: 3.6 g/dL (ref 3.5–5.0)
Anion gap: 12 (ref 5–15)
BUN: 20 mg/dL (ref 8–23)
CHLORIDE: 97 mmol/L — AB (ref 98–111)
CO2: 29 mmol/L (ref 22–32)
CREATININE: 2.11 mg/dL — AB (ref 0.61–1.24)
Calcium: 8.6 mg/dL — ABNORMAL LOW (ref 8.9–10.3)
GFR calc non Af Amer: 27 mL/min — ABNORMAL LOW (ref >60)
GFR, EST AFRICAN AMERICAN: 31 mL/min — AB (ref >60)
Glucose, Bld: 109 mg/dL — ABNORMAL HIGH (ref 70–99)
Phosphorus: 3.5 mg/dL (ref 2.5–4.6)
Potassium: 4.4 mmol/L (ref 3.5–5.1)
Sodium: 138 mmol/L (ref 135–145)

## 2018-04-30 LAB — CBC
HCT: 37.5 % — ABNORMAL LOW (ref 39.0–52.0)
HEMOGLOBIN: 12 g/dL — AB (ref 13.0–17.0)
MCH: 31.5 pg (ref 26.0–34.0)
MCHC: 32 g/dL (ref 30.0–36.0)
MCV: 98.4 fL (ref 78.0–100.0)
Platelets: 193 10*3/uL (ref 150–400)
RBC: 3.81 MIL/uL — AB (ref 4.22–5.81)
RDW: 15.6 % — ABNORMAL HIGH (ref 11.5–15.5)
WBC: 7.5 10*3/uL (ref 4.0–10.5)

## 2018-04-30 MED ORDER — LEVOTHYROXINE SODIUM 100 MCG IV SOLR
100.0000 ug | Freq: Every day | INTRAVENOUS | Status: DC
  Administered 2018-04-30 – 2018-05-05 (×6): 100 ug via INTRAVENOUS
  Filled 2018-04-30 (×7): qty 5

## 2018-04-30 MED ORDER — LIOTHYRONINE SODIUM 5 MCG PO TABS
5.0000 ug | ORAL_TABLET | Freq: Once | ORAL | Status: AC
  Administered 2018-04-30: 5 ug via ORAL
  Filled 2018-04-30: qty 1

## 2018-04-30 NOTE — Progress Notes (Signed)
[place] placed back in nares after treatment

## 2018-04-30 NOTE — Progress Notes (Signed)
[facility] Progress Note  Patient: [name] [name] ZOX:[id number]   PCP: Henrine Screws, MD DOB: [birth year]   DOA: 04/26/2018   DOS: 04/30/2018   Date of Service: the patient was seen and examined on 04/30/2018  Subjective: No more agitation.  But still remains severely confused.  Tangential talk still remains.  Able to tolerate diet although.  Brief hospital course: Pt. with PMH of HTN, HLD, paroxysmal A. fib S/P left atrial appendage device placement, aortic insufficiency, DVT, anticoagulation with Eliquis, COPD, OSA, CKD 3; admitted on 04/26/2018, presented with complaint of confusion, was found to have acute kidney injury as well as polypharmacy. Currently further plan is continue to treat confusion.  Assessment and Plan: 1.  Acute metabolic and toxic encephalopathy. History of dementia UTI Severe hypothyroidism likely myxedema  Patient presents with severe confusion, he drove off and went to [place] called his son that he was at [employer].  A silver alert was called for the patient, he was found at 2 AM. Renal function is getting better but was still not back to baseline. We will hold all psychotropic medications other than Risperdal. TSH significantly elevated and Synthroid dose was increased. Cortisol level adequate.  Repeat TSH still high and free T4 very low. Provided IV Solu-Medrol, IV Synthroid.  Increase dose Synthroid. Suspect that the oral Synthroid is not effective and therefore we will give him IV Synthroid for now.  Also will give him 1 dose of oral T3. If there is no improvement in patient's mentation may require endocrinology consult. bladder scan now unremarkable. Do not use benzodiazepine on this patient. PRN Haldol ordered. Per family he has been confused lately and it has been getting worse over the last few months. This is likely undiagnosed progressive dementia, also suspect that the patient is not taking his Synthroid adequately at home which is leading to his  severe hypothyroidism likely presenting with myxedema coma-like situation.  CT head negative. UA shows possible UTI and therefore patient is on IV ceftriaxone. Switch to oral keflex, urine culture negative.  Total 5-day treatment course. Repeat CT head unremarkable. ABG also shows no evidence of hypercarbia. Unable to tolerate CPAP nightly.  2.  Severe hypothyroidism.  Suspect myxedema, not comatose Patient is on Synthroid 175 MCG at home. TSH still significantly elevated with 154 level value. Recheck TSH 134 and free T4 0.2. Dose increased to 200 MCG.  But suspect patient has poor absorption therefore we will continue with IV Synthroid for now. Before the Synthroid patient received IV Solu-Medrol x1.  Patient also will receive 1 dose of T3.  Not sure whether the patient is taking his medications regularly and consistently. Recommend rechecking TSH as well as recommend outpatient establishing care with endocrinologist.  3.  Anxiety and mood disorder. Patient is on Klonopin, Wellbutrin, BuSpar, Lexapro, Remeron, Lamictal. I suspect that this many medications are too much for a patient of 84 year old. Recommend to discontinue Klonopin on discharge, also discontinue BuSpar on discharge. Currently holding but would like to resume when possible Wellbutrin and Lexapro Lamictal Increased risperidone at night.  Would also prefer to discontinue Remeron for now.  4.  BPH. Patient is on dutasteride. Flomax added. PRN bladder scan.  5.  GERD. On PPI we will continue that.  6.  COPD. Continuing home inhalers.  Adding PRN DuoNeb's.  7.  History of DVT. History of paroxysmal A. fib. S/P left atrial appendage device placement. Continue Eliquis.  Diet: Regular diet DVT Prophylaxis: on therapeutic anticoagulation.  Advance goals of  care discussion: Full code  Family Communication: no family was present at bedside, at the time of interview.   Disposition:  Discharge to be  determined.  Consultants: none Procedures: none  Antibiotics: Anti-infectives (From admission, onward)   Start     Dose/Rate Route Frequency Ordered Stop   04/29/18 1000  cephALEXin (KEFLEX) capsule 500 mg     500 mg Oral Every 12 hours 04/29/18 0841     04/27/18 0600  cefTRIAXone (ROCEPHIN) 1 g in sodium chloride 0.9 % 100 mL IVPB  Status:  Discontinued     1 g 200 mL/hr over 30 Minutes Intravenous Every 24 hours 04/26/18 0638 04/29/18 0840   04/26/18 0530  cefTRIAXone (ROCEPHIN) 1 g in sodium chloride 0.9 % 100 mL IVPB     1 g 200 mL/hr over 30 Minutes Intravenous  Once 04/26/18 0522 04/26/18 0653       Objective: Physical Exam: Vitals:   04/30/18 0224 04/30/18 0551 04/30/18 0816 04/30/18 0821  BP: 107/74 138/82    Pulse: 72 70    Resp: 17 16    Temp: 97.7 F (36.5 C) 97.8 F (36.6 C)    TempSrc: Oral Oral    SpO2: (!) 89% 94% 94% 94%  Weight:  (!) 143.6 kg    Height:        Intake/Output Summary (Last 24 hours) at 04/30/2018 1047 Last data filed at 04/30/2018 0940 Gross per 24 hour  Intake 960 ml  Output -  Net 960 ml   Filed Weights   04/28/18 0604 04/29/18 0500 04/30/18 0551  Weight: 74.2 kg (!) 141.9 kg (!) 143.6 kg   General: Alert, Awake and Oriented to erson. Appear in mild distress, affect irritable Eyes: PERRL, Conjunctiva normal ENT: Oral Mucosa clear dry. Neck: difficult to assess  JVD, no Abnormal Mass Or lumps Cardiovascular: S1 and S2 Present, aortic systolic  Murmur, Peripheral Pulses Present Respiratory: normal respiratory effort, Bilateral Air entry equal and Decreased, no use of accessory muscle, no Crackles, bilateral expiratory  wheezes Abdomen: Bowel Sound present, Soft and no tenderness, no hernia Skin: no redness, no Rash, no induration Extremities: no Pedal edema, no calf tenderness Neurologic: Grossly no focal neuro deficit. Bilaterally Equal motor strength  Data Reviewed: CBC: Recent Labs  Lab 04/26/18 0503 04/27/18 0416  04/30/18 0458  WBC 6.4 6.2 7.5  NEUTROABS 3.2 3.4  --   HGB 12.5* 12.4* 12.0*  HCT 40.2 39.4 37.5*  MCV 99.3 98.3 98.4  PLT 181 186 193   Basic Metabolic Panel: Recent Labs  Lab 04/26/18 0439 04/27/18 0416 04/28/18 0412 04/29/18 0348 04/30/18 0458  NA 143 141 138 138 138  K 3.9 4.2 4.3 4.2 4.4  CL 100 100 98 97* 97*  CO2 31 32 29 30 29   GLUCOSE 146* 125* 153* 113* 109*  BUN 32* 25* 19 18 20   CREATININE 3.16* 2.34* 2.19* 2.06* 2.11*  CALCIUM 9.5 9.0 8.8* 8.4* 8.6*  MG  --  2.1  --   --   --   PHOS  --  2.8 2.9 3.2 3.5    Liver Function Tests: Recent Labs  Lab 04/26/18 0439 04/27/18 0416 04/28/18 0412 04/29/18 0348 04/30/18 0458  AST 38 37  --   --   --   ALT 22 19  --   --   --   ALKPHOS 91 80  --   --   --   BILITOT 0.9 0.9  --   --   --  PROT 7.8 7.1  --   --   --   ALBUMIN 4.4 3.9 3.8 3.6 3.6   No results for input(s): LIPASE, AMYLASE in the last 168 hours. No results for input(s): AMMONIA in the last 168 hours. Coagulation Profile: Recent Labs  Lab 04/26/18 0439  INR 1.16   Cardiac Enzymes: No results for input(s): CKTOTAL, CKMB, CKMBINDEX, TROPONINI in the last 168 hours. BNP (last 3 results) No results for input(s): PROBNP in the last 8760 hours. CBG: Recent Labs  Lab 04/26/18 0440  GLUCAP 136*   Studies: US Renal  Result Date: 04/29/2018 CLINICAL DATA:  Acute kidney injury EXAM: RENAL / URINARY TRACT ULTRASOUND COMPLETE COMPARISON:  None. FINDINGS: Right Kidney: Length: 10.1 cm. Limited visualization with ultrasound because of body habitus. Normal echogenicity. No hydronephrosis. Left Kidney: Length: 10.3 cm. Normal echogenicity. Minor pelviectasis, nonspecific. No significant hydronephrosis. No definite focal abnormality. Bladder: Appears normal for degree of bladder distention. IMPRESSION: No significant or acute finding by ultrasound. Minor left kidney pelviectasis. Electronically Signed   By: Judie Petit.  Shick M.D.   On: 04/29/2018 15:23     Scheduled Meds: . apixaban  2.5 mg Oral BID  . carvedilol  25 mg Oral BID WC  . cephALEXin  500 mg Oral Q12H  . ipratropium-albuterol  3 mL Nebulization Q6H  . levothyroxine  100 mcg Intravenous Daily  . liothyronine  5 mcg Oral Once  . risperiDONE  0.5 mg Oral Daily  . risperiDONE  1 mg Oral QHS  . tamsulosin  0.4 mg Oral QPC supper   Continuous Infusions:  PRN Meds: acetaminophen **OR** acetaminophen, albuterol, haloperidol lactate, hydrALAZINE  Time spent: 35 minutes  Author: Lynden Oxford, MD [facility] Pager: [phone] 04/30/2018 10:47 AM  If 7PM-7AM, please contact night-coverage at [email or web address], password [facility]

## 2018-05-01 LAB — RENAL FUNCTION PANEL
Albumin: 3.3 g/dL — ABNORMAL LOW (ref 3.5–5.0)
Anion gap: 10 (ref 5–15)
BUN: 20 mg/dL (ref 8–23)
CO2: 28 mmol/L (ref 22–32)
Calcium: 8.3 mg/dL — ABNORMAL LOW (ref 8.9–10.3)
Chloride: 99 mmol/L (ref 98–111)
Creatinine, Ser: 1.86 mg/dL — ABNORMAL HIGH (ref 0.61–1.24)
GFR calc Af Amer: 37 mL/min — ABNORMAL LOW (ref >60)
GFR calc non Af Amer: 32 mL/min — ABNORMAL LOW (ref >60)
GLUCOSE: 96 mg/dL (ref 70–99)
Phosphorus: 3.9 mg/dL (ref 2.5–4.6)
Potassium: 3.9 mmol/L (ref 3.5–5.1)
SODIUM: 137 mmol/L (ref 135–145)

## 2018-05-01 LAB — CBC
HCT: 37.5 % — ABNORMAL LOW (ref 39.0–52.0)
Hemoglobin: 11.9 g/dL — ABNORMAL LOW (ref 13.0–17.0)
MCH: 31.4 pg (ref 26.0–34.0)
MCHC: 31.7 g/dL (ref 30.0–36.0)
MCV: 98.9 fL (ref 78.0–100.0)
PLATELETS: 171 10*3/uL (ref 150–400)
RBC: 3.79 MIL/uL — ABNORMAL LOW (ref 4.22–5.81)
RDW: 15.5 % (ref 11.5–15.5)
WBC: 5.2 10*3/uL (ref 4.0–10.5)

## 2018-05-01 LAB — CULTURE, BLOOD (ROUTINE X 2)
CULTURE: NO GROWTH
Culture: NO GROWTH
SPECIAL REQUESTS: ADEQUATE

## 2018-05-01 MED ORDER — IPRATROPIUM-ALBUTEROL 0.5-2.5 (3) MG/3ML IN SOLN
3.0000 mL | Freq: Three times a day (TID) | RESPIRATORY_TRACT | Status: DC
  Administered 2018-05-01 – 2018-05-06 (×14): 3 mL via RESPIRATORY_TRACT
  Filled 2018-05-01 (×16): qty 3

## 2018-05-01 NOTE — Progress Notes (Signed)
[facility] Progress Note  Patient: [name] [name] ZOX:[id number]   PCP: Henrine Screws, MD DOB: 09/01/1931   DOA: 04/26/2018   DOS: 05/01/2018   Date of Service: the patient was seen and examined on 05/01/2018  Subjective: Remains nonagitated.  Getting better.  Still incoherent speech.  Brief hospital course: Pt. with PMH of HTN, HLD, paroxysmal A. fib S/P left atrial appendage device placement, aortic insufficiency, DVT, anticoagulation with Eliquis, COPD, OSA, CKD 3; admitted on 04/26/2018, presented with complaint of confusion, was found to have acute kidney injury as well as polypharmacy. Currently further plan is continue to treat myxedema,.  Assessment and Plan: 1.  Acute metabolic and toxic encephalopathy. History of dementia UTI Severe hypothyroidism early myxedema coma   Patient presents with severe confusion, he drove off and went to [place] called his son that he was at [employer].  A silver alert was called for the patient, he was found at 2 AM. Renal function is getting better but was still not back to baseline. We will hold all psychotropic medications other than Risperdal. TSH significantly elevated and Synthroid dose was increased. Cortisol level adequate.  Repeat TSH still high and free T4 very low. Provided IV Solu-Medrol, IV Synthroid.  Increase dose Synthroid. Suspect that the oral Synthroid is not effective and therefore we will give him IV Synthroid for now.  Also will give him 1 dose of oral T3. If there is no improvement in patient's mentation may require endocrinology consult. bladder scan now unremarkable. Do not use benzodiazepine on this patient. PRN Haldol ordered. Per family he has been confused lately and it has been getting worse over the last few months. This is likely undiagnosed progressive dementia, also suspect that the patient is not taking his Synthroid adequately at home which is leading to his severe hypothyroidism likely presenting with myxedema  coma-like situation.  CT head negative. UA shows possible UTI and therefore patient is on IV ceftriaxone. Switch to oral keflex, urine culture negative.  Total 5-day treatment course. Repeat CT head unremarkable. ABG also shows no evidence of hypercarbia. Unable to tolerate CPAP nightly.  2.  Severe hypothyroidism.  Suspect myxedema, not comatose Patient is on Synthroid 175 MCG at home. TSH still significantly elevated with 154 level value. Recheck TSH 134 and free T4 0.2. Dose increased to 200 MCG.  But suspect patient has poor absorption therefore we will continue with IV Synthroid for now. Before the Synthroid patient received IV Solu-Medrol x1.  Patient also receive 1 dose of T3. Continue IV Synthroid for 1 more day tomorrow.  Not sure whether the patient is taking his medications regularly and consistently. Recommend rechecking TSH as well as recommend outpatient establishing care with endocrinologist.  3.  Anxiety and mood disorder. Patient is on Klonopin, Wellbutrin, BuSpar, Lexapro, Remeron, Lamictal. I suspect that this many medications are too much for a patient of 84 year old. Recommend to discontinue Klonopin on discharge, also discontinue BuSpar on discharge. Currently holding but would like to resume when possible Wellbutrin and Lexapro Lamictal Increased risperidone at night.  Would also prefer to discontinue Remeron for now.  4.  BPH. Patient is on dutasteride. Flomax added. PRN bladder scan.  5.  GERD. On PPI we will continue that.  6.  COPD. Continuing home inhalers.  Adding PRN DuoNeb's.  7.  History of DVT. History of paroxysmal A. fib. S/P left atrial appendage device placement. Continue Eliquis.  Diet: Regular diet DVT Prophylaxis: on therapeutic anticoagulation.  Advance goals of care discussion:  Full code  Family Communication: no family was present at bedside, at the time of interview.   Disposition:  Discharge to SNF  Consultants:  none Procedures: none  Antibiotics: Anti-infectives (From admission, onward)   Start     Dose/Rate Route Frequency Ordered Stop   04/29/18 1000  cephALEXin (KEFLEX) capsule 500 mg     500 mg Oral Every 12 hours 04/29/18 0841     04/27/18 0600  cefTRIAXone (ROCEPHIN) 1 g in sodium chloride 0.9 % 100 mL IVPB  Status:  Discontinued     1 g 200 mL/hr over 30 Minutes Intravenous Every 24 hours 04/26/18 0638 04/29/18 0840   04/26/18 0530  cefTRIAXone (ROCEPHIN) 1 g in sodium chloride 0.9 % 100 mL IVPB     1 g 200 mL/hr over 30 Minutes Intravenous  Once 04/26/18 0522 04/26/18 0653       Objective: Physical Exam: Vitals:   05/01/18 0620 05/01/18 0647 05/01/18 0841 05/01/18 1407  BP: 124/85   117/81  Pulse: 73   65  Resp: 20   20  Temp: (!) 97.5 F (36.4 C)   98.6 F (37 C)  TempSrc: Oral   Oral  SpO2: 94%  93% 98%  Weight:  134.4 kg    Height:        Intake/Output Summary (Last 24 hours) at 05/01/2018 1652 Last data filed at 05/01/2018 1157 Gross per 24 hour  Intake 450 ml  Output 600 ml  Net -150 ml   Filed Weights   04/29/18 0500 04/30/18 0551 05/01/18 0647  Weight: (!) 141.9 kg (!) 143.6 kg 134.4 kg   General: Alert, Awake and Oriented to erson. Appear in mild distress, affect irritable Eyes: PERRL, Conjunctiva normal ENT: Oral Mucosa clear dry. Neck: difficult to assess  JVD, no Abnormal Mass Or lumps Cardiovascular: S1 and S2 Present, aortic systolic  Murmur, Peripheral Pulses Present Respiratory: normal respiratory effort, Bilateral Air entry equal and Decreased, no use of accessory muscle, no Crackles, bilateral expiratory  wheezes Abdomen: Bowel Sound present, Soft and no tenderness, no hernia Skin: no redness, no Rash, no induration Extremities: no Pedal edema, no calf tenderness Neurologic: Grossly no focal neuro deficit. Bilaterally Equal motor strength  Data Reviewed: CBC: Recent Labs  Lab 04/26/18 0503 04/27/18 0416 04/30/18 0458 05/01/18 0425  WBC 6.4  6.2 7.5 5.2  NEUTROABS 3.2 3.4  --   --   HGB 12.5* 12.4* 12.0* 11.9*  HCT 40.2 39.4 37.5* 37.5*  MCV 99.3 98.3 98.4 98.9  PLT 181 186 193 171   Basic Metabolic Panel: Recent Labs  Lab 04/27/18 0416 04/28/18 0412 04/29/18 0348 04/30/18 0458 05/01/18 0425  NA 141 138 138 138 137  K 4.2 4.3 4.2 4.4 3.9  CL 100 98 97* 97* 99  CO2 32 29 30 29 28   GLUCOSE 125* 153* 113* 109* 96  BUN 25* 19 18 20 20   CREATININE 2.34* 2.19* 2.06* 2.11* 1.86*  CALCIUM 9.0 8.8* 8.4* 8.6* 8.3*  MG 2.1  --   --   --   --   PHOS 2.8 2.9 3.2 3.5 3.9    Liver Function Tests: Recent Labs  Lab 04/26/18 0439 04/27/18 0416 04/28/18 0412 04/29/18 0348 04/30/18 0458 05/01/18 0425  AST 38 37  --   --   --   --   ALT 22 19  --   --   --   --   ALKPHOS 91 80  --   --   --   --  BILITOT 0.9 0.9  --   --   --   --   PROT 7.8 7.1  --   --   --   --   ALBUMIN 4.4 3.9 3.8 3.6 3.6 3.3*   No results for input(s): LIPASE, AMYLASE in the last 168 hours. No results for input(s): AMMONIA in the last 168 hours. Coagulation Profile: Recent Labs  Lab 04/26/18 0439  INR 1.16   Cardiac Enzymes: No results for input(s): CKTOTAL, CKMB, CKMBINDEX, TROPONINI in the last 168 hours. BNP (last 3 results) No results for input(s): PROBNP in the last 8760 hours. CBG: Recent Labs  Lab 04/26/18 0440  GLUCAP 136*   Studies: No results found.  Scheduled Meds: . apixaban  2.5 mg Oral BID  . carvedilol  25 mg Oral BID WC  . cephALEXin  500 mg Oral Q12H  . ipratropium-albuterol  3 mL Nebulization TID  . levothyroxine  100 mcg Intravenous Daily  . risperiDONE  0.5 mg Oral Daily  . risperiDONE  1 mg Oral QHS  . tamsulosin  0.4 mg Oral QPC supper   Continuous Infusions:  PRN Meds: acetaminophen **OR** acetaminophen, albuterol, haloperidol lactate, hydrALAZINE  Time spent: 35 minutes  Author: Lynden Oxford, MD [facility] Pager: [phone] 05/01/2018 4:52 PM  If 7PM-7AM, please contact night-coverage  at [email or web address], password [facility]

## 2018-05-01 NOTE — Progress Notes (Signed)
Physical Therapy Treatment Patient Details Name: [name] [name] MRN: [id number] DOB: Dec 29, 1931 Today's Date: 05/01/2018    History of Present Illness 84 yo male admitted with AMS, UTI. Hx of HTN, CKD, COPD, syncope, frequent falls, CAD, CHF    PT Comments    The patient is requiring more assistance than on Evaluation. Recommend SNF at DC. Patient is pleasant and able to participate. Continue PT   Follow Up Recommendations  SNF     Equipment Recommendations  None recommended by PT    Recommendations for Other Services       Precautions / Restrictions Precautions Precautions: Fall Precaution Comments: O2 dep    Mobility  Bed Mobility   Bed Mobility: Supine to Sit;Sit to Supine     Supine to sit: Max assist;+2 for physical assistance;+2 for safety/equipment;HOB elevated Sit to supine: Max assist;+2 for physical assistance;+2 for safety/equipment   General bed mobility comments: much more assistance required  than previous vist.  assist with trunk and legs.  Transfers Overall transfer level: Needs assistance Equipment used: Rolling walker (2 wheeled) Transfers: Sit to/from Stand Sit to Stand: Max assist;+2 physical assistance;+2 safety/equipment;From elevated surface         General transfer comment: Assist to rise, stabilize, control descent. VCs safety, hand placement.  BEd raised. required max assist. Stood x 2  Ambulation/Gait                 Stairs             Wheelchair Mobility    Modified Rankin (Stroke Patients Only)       Balance                                            Cognition Arousal/Alertness: Awake/alert Behavior During Therapy: WFL for tasks assessed/performed Overall Cognitive Status: No family/caregiver present to determine baseline cognitive functioning                                 General Comments: oriented to labor day, place      Exercises      General Comments         Pertinent Vitals/Pain Pain Assessment: No/denies pain    Home Living                      Prior Function            PT Goals (current goals can now be found in the care plan section) Progress towards PT goals: Progressing toward goals    Frequency    Min 3X/week      PT Plan Current plan remains appropriate    Co-evaluation              AM-PAC PT "6 Clicks" Daily Activity  Outcome Measure  Difficulty turning over in bed (including adjusting bedclothes, sheets and blankets)?: Unable Difficulty moving from lying on back to sitting on the side of the bed? : Unable Difficulty sitting down on and standing up from a chair with arms (e.g., wheelchair, bedside commode, etc,.)?: Unable Help needed moving to and from a bed to chair (including a wheelchair)?: Total Help needed walking in hospital room?: Total Help needed climbing 3-5 steps with a railing? : Total 6 Click Score: 6    End of Session Equipment  Utilized During Treatment: Oxygen Activity Tolerance: Patient tolerated treatment well Patient left: in bed;with call bell/phone within reach;with nursing/sitter in room Nurse Communication: Mobility status PT Visit Diagnosis: Muscle weakness (generalized) (M62.81);Difficulty in walking, not elsewhere classified (R26.2)     Time: 1572-6203 PT Time Calculation (min) (ACUTE ONLY): 21 min  Charges:  $Therapeutic Activity: 8-22 mins                     [facility] PT [phone]    Rada Hay 05/01/2018, 4:24 PM

## 2018-05-02 LAB — RENAL FUNCTION PANEL
ANION GAP: 8 (ref 5–15)
Albumin: 3.3 g/dL — ABNORMAL LOW (ref 3.5–5.0)
BUN: 21 mg/dL (ref 8–23)
CALCIUM: 8.4 mg/dL — AB (ref 8.9–10.3)
CHLORIDE: 98 mmol/L (ref 98–111)
CO2: 32 mmol/L (ref 22–32)
Creatinine, Ser: 2.04 mg/dL — ABNORMAL HIGH (ref 0.61–1.24)
GFR calc non Af Amer: 28 mL/min — ABNORMAL LOW (ref >60)
GFR, EST AFRICAN AMERICAN: 33 mL/min — AB (ref >60)
GLUCOSE: 105 mg/dL — AB (ref 70–99)
Phosphorus: 3.6 mg/dL (ref 2.5–4.6)
Potassium: 4.1 mmol/L (ref 3.5–5.1)
Sodium: 138 mmol/L (ref 135–145)

## 2018-05-02 LAB — CBC
HEMATOCRIT: 36.9 % — AB (ref 39.0–52.0)
HEMOGLOBIN: 11.6 g/dL — AB (ref 13.0–17.0)
MCH: 31 pg (ref 26.0–34.0)
MCHC: 31.4 g/dL (ref 30.0–36.0)
MCV: 98.7 fL (ref 78.0–100.0)
Platelets: 169 10*3/uL (ref 150–400)
RBC: 3.74 MIL/uL — AB (ref 4.22–5.81)
RDW: 15.3 % (ref 11.5–15.5)
WBC: 5.3 10*3/uL (ref 4.0–10.5)

## 2018-05-02 NOTE — Progress Notes (Addendum)
CSW informed that SNF has been recommended for pt post DC. Met with pt at bedside- alert and oriented to person and place, somewhat to situation but otherwise did not interact meaningfully, was drowsy. Stated he remembered going to rehab in the past.  Discussed with pt's son via phone. Son states pt lives with wife and was ambulating with walker PTA- has dementia which he states has seemed to progress over past few months.  Pt being treated inpatient for encephalopathy (improving per chart), possible UTI, and hypothyroidism.  Pt and son agreeable to pursuing SNF at [place], states pt has been to rehab twice in the past.  CSW explained need for [place] prior authorization to admit to SNF, son understanding.  Completed FL2 and made referrals.  Will follow up with bed offers. Also noted pt has sitter at bedside, sitter will need to be DC'd x 24 hours prior to admitting to a SNF at [place] as well.  Sharren Bridge, MSW, LCSW Clinical Social Work 05/02/2018 [phone]  14:45- provided bed offers to pt and son- reviewing to make selection so that insurance authorization can be initiated  15:25- selected [facility] confirmed with facility and insurance authorization request initiated.

## 2018-05-02 NOTE — Progress Notes (Signed)
[facility] Progress Note  Patient: [name] [name] UJW:[id number]   PCP: Henrine Screws, MD DOB: [birth year]   DOA: 04/26/2018   DOS: 05/02/2018   Date of Service: the patient was seen and examined on 05/02/2018  Subjective: No nausea no vomiting.  No significant agitation overnight.  Slept good last night.  Denies any acute complaint.  Per family, getting close to his baseline.  Brief hospital course: Pt. with PMH of HTN, HLD, paroxysmal A. fib S/P left atrial appendage device placement, aortic insufficiency, DVT, anticoagulation with Eliquis, COPD, OSA, CKD 3; admitted on 04/26/2018, presented with complaint of confusion, was found to have acute kidney injury as well as polypharmacy.  TSH was elevated to 154 level with low free T4. Currently further plan is continue to treat myxedema,.  Assessment and Plan: 1.  Acute metabolic and toxic encephalopathy. History of dementia UTI Severe hypothyroidism early myxedema coma   Patient presents with severe confusion, he drove off and went to [place], called his son that he was at the beach.  A silver alert was called for the patient, he was found at 2 AM on the day of admission. Initial diagnosis was acute kidney injury as well as UTI and polypharmacy. Further work-up showed a severe hypothyroidism with TSH of 157, on recheck TSH was 138 with free T4 of 0.20.  Cortisol level was 17. Last TSH in July 2019 was 3.85. Patient was given aggressive IV hydration.  With his renal function got back to baseline. For agitation patient was started on Risperdal and all other psychotropic medications were on hold and would like to continue to use keep on hold.  Continue risperidone.  Patient was given 1 dose of IV Solu-Medrol. Continue IV Synthroid.  We will transition on discharge to oral Synthroid from 175 MCG to 200 MCG. Patient was initially given IV Synthroid and was transitioned to oral Synthroid with worsening mentation and therefore suspect that currently  oral Synthroid is still not effective and therefore would like to continue IV Synthroid for now. Patient also received 1 dose of oral T3.  If there is no improvement in patient's mentation may require endocrinology consult. Patient initially had some urinary retention, needed in and out catheterization, bladder scan now unremarkable.  Do not use benzodiazepine on this patient. PRN Haldol ordered.   Per family he has been confused lately and it has been getting worse over the last few months.  Suspect that patient has not been taking his levothyroxine regularly as it was near normal in June 2019. There is also likely undiagnosed progressive dementia,   CT head negative. UA shows possible UTI and therefore patient was treated with IV ceftriaxone. Switch to oral keflex, urine culture negative.  Total 5-day treatment course. Repeat CT head unremarkable. ABG also shows no evidence of hypercarbia. Unable to tolerate CPAP nightly.  2.  Severe hypothyroidism.  Suspect myxedema, not comatose Patient is on Synthroid 175 MCG at home. TSH still significantly elevated with 154 level value. Recheck TSH 134 and free T4 0.2. Dose increased to 200 MCG.  But suspect patient has poor absorption therefore we will continue with IV Synthroid for now. Before the Synthroid patient received IV Solu-Medrol x1.  Patient also receive 1 dose of T3. Continue IV Synthroid for 1 more day tomorrow.  Not sure whether the patient is taking his medications regularly and consistently. Recommend rechecking TSH as well as recommend outpatient establishing care with endocrinologist.  3.  Anxiety and mood disorder. Patient is on Klonopin,  Wellbutrin, BuSpar, Lexapro, Remeron, Lamictal. I suspect that this many medications are too much for a patient of 84 year old. Recommend to discontinue Klonopin on discharge, also discontinue BuSpar on discharge. Currently holding but would like to resume when possible Wellbutrin and  Lexapro Lamictal. Increased risperidone at night.  Would also prefer to discontinue Remeron for now.  4.  BPH. Patient is on dutasteride. Flomax added. PRN bladder scan.  5.  GERD. On PPI we will continue that.  6.  COPD. Acute hypoxia OSA noncompliant with CPAP Continuing home inhalers.  Adding PRN DuoNeb's.  7.  History of DVT. History of paroxysmal A. fib. S/P left atrial appendage device placement. Continue Eliquis.  Diet: Regular diet DVT Prophylaxis: on therapeutic anticoagulation.  Advance goals of care discussion: Full code  Family Communication: no family was present at bedside, at the time of interview.  Discussed with son on 05/01/2018  Disposition:  Discharge to SNF  Consultants: none Procedures: none  Antibiotics: Anti-infectives (From admission, onward)   Start     Dose/Rate Route Frequency Ordered Stop   04/29/18 1000  cephALEXin (KEFLEX) capsule 500 mg     500 mg Oral Every 12 hours 04/29/18 0841     04/27/18 0600  cefTRIAXone (ROCEPHIN) 1 g in sodium chloride 0.9 % 100 mL IVPB  Status:  Discontinued     1 g 200 mL/hr over 30 Minutes Intravenous Every 24 hours 04/26/18 0638 04/29/18 0840   04/26/18 0530  cefTRIAXone (ROCEPHIN) 1 g in sodium chloride 0.9 % 100 mL IVPB     1 g 200 mL/hr over 30 Minutes Intravenous  Once 04/26/18 0522 04/26/18 0653       Objective: Physical Exam: Vitals:   05/02/18 0738 05/02/18 1014 05/02/18 1415 05/02/18 1417  BP:   113/76   Pulse:   63 71  Resp:   18   Temp:   98.4 F (36.9 C)   TempSrc:   Oral   SpO2:  93%  94%  Weight:      Height: 6' 0.01" (1.829 m)       Intake/Output Summary (Last 24 hours) at 05/02/2018 1610 Last data filed at 05/02/2018 0826 Gross per 24 hour  Intake 350 ml  Output 75 ml  Net 275 ml   Filed Weights   04/30/18 0551 05/01/18 0647 05/02/18 0500  Weight: (!) 143.6 kg 134.4 kg (!) 140.5 kg   General: Alert, Awake and Oriented to time place and person. Appear in mild distress,  affect appropriate Eyes: PERRL, Conjunctiva normal ENT: Oral Mucosa clear moist. Neck: difficult to assess  JVD, no Abnormal Mass Or lumps Cardiovascular: S1 and S2 Present, aortic systolic  Murmur, Peripheral Pulses Present Respiratory: normal respiratory effort, Bilateral Air entry equal and Decreased, no use of accessory muscle, no Crackles, bilateral expiratory  wheezes Abdomen: Bowel Sound present, Soft and no tenderness, no hernia Skin: no redness, no Rash, no induration Extremities: no Pedal edema, no calf tenderness Neurologic: Grossly no focal neuro deficit. Bilaterally Equal motor strength  Data Reviewed: CBC: Recent Labs  Lab 04/26/18 0503 04/27/18 0416 04/30/18 0458 05/01/18 0425 05/02/18 0432  WBC 6.4 6.2 7.5 5.2 5.3  NEUTROABS 3.2 3.4  --   --   --   HGB 12.5* 12.4* 12.0* 11.9* 11.6*  HCT 40.2 39.4 37.5* 37.5* 36.9*  MCV 99.3 98.3 98.4 98.9 98.7  PLT 181 186 193 171 169   Basic Metabolic Panel: Recent Labs  Lab 04/27/18 0416 04/28/18 0412 04/29/18 0348 04/30/18 0458 05/01/18  0425 05/02/18 0432  NA 141 138 138 138 137 138  K 4.2 4.3 4.2 4.4 3.9 4.1  CL 100 98 97* 97* 99 98  CO2 32 29 30 29 28  32  GLUCOSE 125* 153* 113* 109* 96 105*  BUN 25* 19 18 20 20 21   CREATININE 2.34* 2.19* 2.06* 2.11* 1.86* 2.04*  CALCIUM 9.0 8.8* 8.4* 8.6* 8.3* 8.4*  MG 2.1  --   --   --   --   --   PHOS 2.8 2.9 3.2 3.5 3.9 3.6    Liver Function Tests: Recent Labs  Lab 04/26/18 0439 04/27/18 0416 04/28/18 0412 04/29/18 0348 04/30/18 0458 05/01/18 0425 05/02/18 0432  AST 38 37  --   --   --   --   --   ALT 22 19  --   --   --   --   --   ALKPHOS 91 80  --   --   --   --   --   BILITOT 0.9 0.9  --   --   --   --   --   PROT 7.8 7.1  --   --   --   --   --   ALBUMIN 4.4 3.9 3.8 3.6 3.6 3.3* 3.3*   No results for input(s): LIPASE, AMYLASE in the last 168 hours. No results for input(s): AMMONIA in the last 168 hours. Coagulation Profile: Recent Labs  Lab  04/26/18 0439  INR 1.16   Cardiac Enzymes: No results for input(s): CKTOTAL, CKMB, CKMBINDEX, TROPONINI in the last 168 hours. BNP (last 3 results) No results for input(s): PROBNP in the last 8760 hours. CBG: Recent Labs  Lab 04/26/18 0440  GLUCAP 136*   Studies: No results found.  Scheduled Meds: . apixaban  2.5 mg Oral BID  . carvedilol  25 mg Oral BID WC  . cephALEXin  500 mg Oral Q12H  . ipratropium-albuterol  3 mL Nebulization TID  . levothyroxine  100 mcg Intravenous Daily  . risperiDONE  0.5 mg Oral Daily  . risperiDONE  1 mg Oral QHS  . tamsulosin  0.4 mg Oral QPC supper   Continuous Infusions:  PRN Meds: acetaminophen **OR** acetaminophen, albuterol, haloperidol lactate, hydrALAZINE  Time spent: 35 minutes  Author: Lynden Oxford, MD [facility] Pager: [phone] 05/02/2018 4:10 PM  If 7PM-7AM, please contact night-coverage at [email or web address], password [email or web address]

## 2018-05-02 NOTE — Progress Notes (Signed)
Occupational Therapy Treatment Patient Details Name: [name] [name] MRN: [id number] DOB: 01/22/1932 Today's Date: 05/02/2018    History of present illness 84 yo male admitted with AMS, UTI. Hx of HTN, CKD, COPD, syncope, frequent falls, CAD, CHF   OT comments  Pt needed increased A this day  Follow Up Recommendations  SNF    Equipment Recommendations  3 in 1 bedside commode       Precautions / Restrictions Precautions Precautions: Fall Precaution Comments: O2 dep       Mobility Bed Mobility Overal bed mobility: Needs Assistance Bed Mobility: Supine to Sit;Sit to Supine     Supine to sit: Total assist;+2 for physical assistance;HOB elevated Sit to supine: Total assist;+2 for physical assistance   General bed mobility comments: much more assistance required  than previous vist.  assist with trunk and legs.  Transfers Overall transfer level: Needs assistance   Transfers: Sit to/from Stand Sit to Stand: Max assist;+2 physical assistance;+2 safety/equipment;From elevated surface         General transfer comment: attempted to stand, knees buckling. Unable to stand and patient would not attempt again.     Balance Overall balance assessment: Needs assistance;History of Falls         Standing balance support: Bilateral upper extremity supported Standing balance-Leahy Scale: Poor                             ADL either performed or assessed with clinical judgement   ADL Overall ADL's : Needs assistance/impaired Eating/Feeding: Moderate assistance;Sitting   Grooming: Moderate assistance;Sitting                                 General ADL Comments: pt needed increased A this day- See PT note as well.                 Cognition Arousal/Alertness: Lethargic Behavior During Therapy: WFL for tasks assessed/performed                                   General Comments: patient has  more slurred speech, required much  stimulation to arous                   Pertinent Vitals/ Pain       Pain Assessment: No/denies pain     Prior Functioning/Environment              Frequency  Min 2X/week        Progress Toward Goals  OT Goals(current goals can now be found in the care plan section)  Progress towards OT goals: Progressing toward goals     Plan Discharge plan remains appropriate    Co-evaluation                 AM-PAC PT "6 Clicks" Daily Activity     Outcome Measure   Help from another person eating meals?: None Help from another person taking care of personal grooming?: A Little Help from another person toileting, which includes using toliet, bedpan, or urinal?: A Little Help from another person bathing (including washing, rinsing, drying)?: A Lot Help from another person to put on and taking off regular upper body clothing?: A Little Help from another person to put on and taking off regular lower body clothing?: A Lot 6  Click Score: 17    End of Session    OT Visit Diagnosis: Unsteadiness on feet (R26.81);Muscle weakness (generalized) (M62.81)   Activity Tolerance Patient limited by fatigue   Patient Left in bed;with call bell/phone within reach   Nurse Communication          Time: 7829-5621 OT Time Calculation (min): 21 min  Charges: OT General Charges $OT Visit: 1 Visit OT Treatments $Self Care/Home Management : 8-22 mins  [place], [place] [phone]   [name] 05/02/2018, 5:22 PM

## 2018-05-02 NOTE — Progress Notes (Signed)
Physical Therapy Treatment Patient Details Name: [name] [name] MRN: [id number] DOB: September 10, 1931 Today's Date: 05/02/2018    History of Present Illness 84 yo male admitted with AMS, UTI. Hx of HTN, CKD, COPD, syncope, frequent falls, CAD, CHF    PT Comments    The patient is more lethargic, speech is slurred and difficult to understand. The patient required total assistance today and was unable to stand. Noted jerking of the arms and legs. Assisted patient back into bed.    Follow Up Recommendations  SNF     Equipment Recommendations  None recommended by PT    Recommendations for Other Services       Precautions / Restrictions Precautions Precautions: Fall Precaution Comments: O2 dep Restrictions Weight Bearing Restrictions: No    Mobility  Bed Mobility Overal bed mobility: Needs Assistance Bed Mobility: Supine to Sit;Sit to Supine     Supine to sit: Total assist;+2 for physical assistance;HOB elevated Sit to supine: Total assist;+2 for physical assistance   General bed mobility comments: much more assistance required  than previous vist.  assist with trunk and legs.  Transfers     Transfers: Sit to/from Stand           General transfer comment: attempted to stand, knees buckling. Unable to stand and patient would not attempt again.   Ambulation/Gait                 Stairs             Wheelchair Mobility    Modified Rankin (Stroke Patients Only)       Balance                                            Cognition Arousal/Alertness: Lethargic Behavior During Therapy: WFL for tasks assessed/performed                                   General Comments: patient has  more slurred speech, required much stimulation to arous      Exercises      General Comments        Pertinent Vitals/Pain Faces Pain Scale: No hurt    Home Living                      Prior Function            PT Goals  (current goals can now be found in the care plan section) Progress towards PT goals: Progressing toward goals    Frequency    Min 2X/week      PT Plan Current plan remains appropriate;Frequency needs to be updated    Co-evaluation PT/OT/SLP Co-Evaluation/Treatment: Yes Reason for Co-Treatment: For patient/therapist safety PT goals addressed during session: Mobility/safety with mobility OT goals addressed during session: ADL's and self-care      AM-PAC PT "6 Clicks" Daily Activity  Outcome Measure  Difficulty turning over in bed (including adjusting bedclothes, sheets and blankets)?: Unable Difficulty moving from lying on back to sitting on the side of the bed? : Unable Difficulty sitting down on and standing up from a chair with arms (e.g., wheelchair, bedside commode, etc,.)?: Unable Help needed moving to and from a bed to chair (including a wheelchair)?: Total Help needed walking in hospital room?: Total Help needed climbing  3-5 steps with a railing? : Total 6 Click Score: 6    End of Session Equipment Utilized During Treatment: Oxygen Activity Tolerance: Patient limited by fatigue Patient left: in bed;with call bell/phone within reach;with nursing/sitter in room Nurse Communication: Mobility status;Need for lift equipment PT Visit Diagnosis: Muscle weakness (generalized) (M62.81);Difficulty in walking, not elsewhere classified (R26.2)     Time: 3212-2482 PT Time Calculation (min) (ACUTE ONLY): 24 min  Charges:  $Therapeutic Activity: 8-22 mins                     [facility] PT [phone]    Rada Hay 05/02/2018, 12:16 PM

## 2018-05-02 NOTE — [place] FL2 (Signed)
[place] MEDICAID FL2 LEVEL OF CARE SCREENING TOOL     IDENTIFICATION  Patient Name: [name] [name] Birthdate: October 02, 1931 Sex: male Admission Date (Current Location): 04/26/2018  [facility] and [place] Number:  Producer, television/film/video and Address:  [facility],  [address] [place], [place] [address]      Provider Number: [id number]  Attending Physician Name and Address:  Rolly Salter, MD  Relative Name and Phone Number:       Current Level of Care: Hospital Recommended Level of Care: Skilled Nursing Facility Prior Approval Number:    Date Approved/Denied:   PASRR Number: [id number]  Discharge Plan: SNF    Current Diagnoses: Patient Active Problem List   Diagnosis Date Noted  . Altered mental status 04/26/2018  . Acute renal failure (ARF) (HCC) 04/26/2018  . Acute lower UTI 04/26/2018  . Anemia 04/26/2018  . CKD (chronic kidney disease) stage 3, GFR 30-59 ml/min (HCC) 01/29/2018  . Symptomatic hypotension 01/27/2018  . COPD with acute exacerbation (HCC) 01/27/2018  . Cellulitis 01/05/2018  . Pre-diabetes 01/07/2017  . COPD (chronic obstructive pulmonary disease) with chronic bronchitis (HCC) 12/08/2016  . Elevated hemidiaphragm 12/08/2016  . Enlarged thoracic aorta (HCC) 12/03/2016  . Chronic diastolic heart failure (HCC) 12/03/2016  . Pulmonary nodule 12/03/2016  . Orthostatic hypotension 11/08/2015  . S/P AVR 02/11/2015  . Ataxia 12/05/2014  . Abnormality of gait 12/05/2014  . Neck pain, chronic 12/05/2014  . Abnormal carotid duplex scan 10/28/2014  . Obstructive sleep apnea 10/28/2014  . Mood disorder with psychosis (HCC) 10/28/2014  . Episodes of formed visual hallucinations 10/16/2014  . CAD (coronary artery disease), native coronary artery 09/29/2014  . Falls frequently 09/29/2014  . Essential hypertension   . Hyperlipidemia, unspecified   . AR (aortic regurgitation)   . Depression   . Paroxysmal A-fib (HCC) 06/20/2014  . Hypothyroidism  06/20/2014  . Exertional dyspnea 04/29/2014  . Obesity (BMI 30-39.9) 08/08/2012  . Anxiety 08/03/2012  . Hydrocele 07/14/2012  . Abdominal aortic aneurysm (HCC) 09/08/2011  . BPH (benign prostatic hyperplasia) 09/08/2011  . GERD (gastroesophageal reflux disease) 09/08/2011    Orientation RESPIRATION BLADDER Height & Weight     Self, Place  O2(2L) Incontinent(intermittent incontinence) Weight: (!) 309 lb 11.9 oz (140.5 kg) Height:  6' (182.9 cm)  BEHAVIORAL SYMPTOMS/MOOD NEUROLOGICAL BOWEL NUTRITION STATUS      Continent Diet(heart healthy diet)  AMBULATORY STATUS COMMUNICATION OF NEEDS Skin   Extensive Assist Verbally Normal                       Personal Care Assistance Level of Assistance  Bathing, Feeding, Dressing Bathing Assistance: Maximum assistance Feeding assistance: Independent Dressing Assistance: Maximum assistance     Functional Limitations Info  Sight, Hearing, Speech Sight Info: Adequate Hearing Info: Adequate Speech Info: Impaired(incoherent at times)    SPECIAL CARE FACTORS FREQUENCY  PT (By licensed PT), OT (By licensed OT)     PT Frequency: 5x OT Frequency: 5x            Contractures Contractures Info: Not present    Additional Factors Info  Code Status, Allergies Code Status Info: full code Allergies Info: nka           Current Medications (05/02/2018):  This is the current hospital active medication list Current Facility-Administered Medications  Medication Dose Route Frequency Provider Last Rate Last Dose  . acetaminophen (TYLENOL) tablet 650 mg  650 mg Oral Q6H PRN Pearson Grippe, MD  650 mg at 04/28/18 2216   Or  . acetaminophen (TYLENOL) suppository 650 mg  650 mg Rectal Q6H PRN Pearson Grippe, MD      . albuterol (PROVENTIL) (2.5 MG/3ML) 0.083% nebulizer solution 2.5 mg  2.5 mg Nebulization Q4H PRN Pearson Grippe, MD      . apixaban Everlene Balls) tablet 2.5 mg  2.5 mg Oral BID Pearson Grippe, MD   2.5 mg at 05/02/18 0931  . carvedilol (COREG)  tablet 25 mg  25 mg Oral BID WC Pearson Grippe, MD   25 mg at 05/02/18 0944  . cephALEXin (KEFLEX) capsule 500 mg  500 mg Oral Q12H Rolly Salter, MD   500 mg at 05/02/18 0931  . haloperidol lactate (HALDOL) injection 5 mg  5 mg Intravenous Q6H PRN Rolly Salter, MD   5 mg at 04/30/18 1739  . hydrALAZINE (APRESOLINE) injection 10 mg  10 mg Intravenous Q4H PRN Rolly Salter, MD   10 mg at 04/29/18 0606  . ipratropium-albuterol (DUONEB) 0.5-2.5 (3) MG/3ML nebulizer solution 3 mL  3 mL Nebulization TID Rolly Salter, MD   3 mL at 05/01/18 2026  . levothyroxine (SYNTHROID, LEVOTHROID) injection 100 mcg  100 mcg Intravenous Daily Rolly Salter, MD   100 mcg at 05/01/18 1151  . risperiDONE (RISPERDAL) tablet 0.5 mg  0.5 mg Oral Daily Rolly Salter, MD   0.5 mg at 05/02/18 0931  . risperiDONE (RISPERDAL) tablet 1 mg  1 mg Oral QHS Rolly Salter, MD   1 mg at 05/01/18 2232  . tamsulosin (FLOMAX) capsule 0.4 mg  0.4 mg Oral QPC supper Rolly Salter, MD   0.4 mg at 05/01/18 1718     Discharge Medications: Please see discharge summary for a list of discharge medications.  Relevant Imaging Results:  Relevant Lab Results:   Additional Information SS# [id number]  Nelwyn Salisbury, LCSW

## 2018-05-03 ENCOUNTER — Inpatient Hospital Stay (HOSPITAL_COMMUNITY): Payer: Medicare Other

## 2018-05-03 DIAGNOSIS — N179 Acute kidney failure, unspecified: Secondary | ICD-10-CM

## 2018-05-03 DIAGNOSIS — R4182 Altered mental status, unspecified: Secondary | ICD-10-CM

## 2018-05-03 DIAGNOSIS — N390 Urinary tract infection, site not specified: Secondary | ICD-10-CM

## 2018-05-03 LAB — BLOOD GAS, ARTERIAL
ACID-BASE EXCESS: 2.8 mmol/L — AB (ref 0.0–2.0)
Acid-Base Excess: 3.8 mmol/L — ABNORMAL HIGH (ref 0.0–2.0)
BICARBONATE: 32.4 mmol/L — AB (ref 20.0–28.0)
BICARBONATE: 30.3 mmol/L — AB (ref 20.0–28.0)
DELIVERY SYSTEMS: POSITIVE
DRAWN BY: 103701
Drawn by: 103701
EXPIRATORY PAP: 8
FIO2: 30
INSPIRATORY PAP: 18
LHR: 8 {breaths}/min
O2 CONTENT: 3 L/min
O2 SAT: 92.9 %
O2 Saturation: 93.1 %
PATIENT TEMPERATURE: 98.6
Patient temperature: 98.6
pCO2 arterial: 82.2 mmHg (ref 32.0–48.0)
pCO2 arterial: 57.4 mmHg — ABNORMAL HIGH (ref 32.0–48.0)
pH, Arterial: 7.22 — ABNORMAL LOW (ref 7.350–7.450)
pH, Arterial: 7.342 — ABNORMAL LOW (ref 7.350–7.450)
pO2, Arterial: 75.9 mmHg — ABNORMAL LOW (ref 83.0–108.0)
pO2, Arterial: 67 mmHg — ABNORMAL LOW (ref 83.0–108.0)

## 2018-05-03 LAB — CBC
HCT: 41 % (ref 39.0–52.0)
Hemoglobin: 12.8 g/dL — ABNORMAL LOW (ref 13.0–17.0)
MCH: 31.2 pg (ref 26.0–34.0)
MCHC: 31.2 g/dL (ref 30.0–36.0)
MCV: 100 fL (ref 78.0–100.0)
PLATELETS: 171 10*3/uL (ref 150–400)
RBC: 4.1 MIL/uL — ABNORMAL LOW (ref 4.22–5.81)
RDW: 15.4 % (ref 11.5–15.5)
WBC: 6.1 10*3/uL (ref 4.0–10.5)

## 2018-05-03 LAB — RENAL FUNCTION PANEL
Albumin: 3.7 g/dL (ref 3.5–5.0)
Anion gap: 9 (ref 5–15)
BUN: 18 mg/dL (ref 8–23)
CHLORIDE: 97 mmol/L — AB (ref 98–111)
CO2: 32 mmol/L (ref 22–32)
Calcium: 8.8 mg/dL — ABNORMAL LOW (ref 8.9–10.3)
Creatinine, Ser: 1.88 mg/dL — ABNORMAL HIGH (ref 0.61–1.24)
GFR calc Af Amer: 36 mL/min — ABNORMAL LOW (ref >60)
GFR calc non Af Amer: 31 mL/min — ABNORMAL LOW (ref >60)
GLUCOSE: 114 mg/dL — AB (ref 70–99)
POTASSIUM: 4.2 mmol/L (ref 3.5–5.1)
Phosphorus: 3.8 mg/dL (ref 2.5–4.6)
Sodium: 138 mmol/L (ref 135–145)

## 2018-05-03 LAB — CORTISOL: Cortisol, Plasma: 15.1 ug/dL

## 2018-05-03 LAB — MAGNESIUM: Magnesium: 2.3 mg/dL (ref 1.7–2.4)

## 2018-05-03 MED ORDER — RISPERIDONE 0.25 MG PO TABS: 0.2500 mg | ORAL_TABLET | Freq: Every day | ORAL | Status: DC

## 2018-05-03 MED ORDER — TOPIRAMATE 25 MG PO TABS: 50.0000 mg | ORAL_TABLET | Freq: Every day | ORAL | Status: DC

## 2018-05-03 MED ORDER — FUROSEMIDE 10 MG/ML IJ SOLN
40.0000 mg | Freq: Once | INTRAMUSCULAR | Status: AC
  Administered 2018-05-03: 40 mg via INTRAVENOUS
  Filled 2018-05-03: qty 4

## 2018-05-03 MED ORDER — RISPERIDONE 0.25 MG PO TABS: 0.5000 mg | ORAL_TABLET | Freq: Every day | ORAL | Status: DC

## 2018-05-03 NOTE — Progress Notes (Signed)
Received call from CMT noted with frequent Multi-focal PVC and wide QRS complexes.  Pt lethergic, answers slowly to name, slow to respond to tactile stimulation. VS noted in the computer, O2 at 3l 93%. SRP, RN

## 2018-05-03 NOTE — Progress Notes (Addendum)
[name]  Notified to make aware of patient being transferred to  [facility]. Son made aware of new room assignment

## 2018-05-03 NOTE — Consult Note (Addendum)
[name] [name]  HNG:[id number] DOB: 1931/10/31 DOA: 04/26/2018 PCP: Henrine Screws, MD    LOS: 6 days   Reason for Consult / Chief Complaint:  Hypercarbic respiratory failure, altered mental status  Consulting MD and date:  05/03/2018- Dr Nunzio Cory MD  HPI/Summary of hospital stay:  84 year old with hypertension hyperlipidemia, atrial fibrillation, aortic insufficiency status post repair, chronic kidney disease, DVT, COPD, OSA [noncompliant].  Admitted on 8/28 with altered mental status, encephalopathy. Found to have UTI as well as severe hypothyroidism with TSH of 157, low free T4.  Treated with IV Synthroid and 1 dose of T3,  1gm Solu-Medrol X 1.  Also noted to have AKI which improved with hydration. CT head on admission was unremarkable.  Treated with IV ceftriaxone was transitioned to oral Keflex for UTI.  9/4- PCCM called for increased lethargy with hypercarbic respiratory failure.  Of note ABG on admission on 8/30 was normal.  Patient placed on BiPAP and transferred to stepdown.  Subjective:  Patient is obtunded, responds by grunting to sternal rub  Objective   Blood pressure 102/74, pulse (!) 56, temperature (!) 97.5 F (36.4 C), temperature source Axillary, resp. rate 16, height 6' (1.829 m), weight (!) 139.7 kg, SpO2 93 %.        Intake/Output Summary (Last 24 hours) at 05/03/2018 1746 Last data filed at 05/03/2018 1236 Gross per 24 hour  Intake 0 ml  Output 100 ml  Net -100 ml   Filed Weights   05/02/18 0500 05/03/18 0500 05/03/18 1711  Weight: (!) 140.5 kg (!) 138.7 kg (!) 139.7 kg    Examination: Gen:      No acute distress, obese HEENT:  EOMI, sclera anicteric Neck:     No masses; no thyromegaly Lungs:    Clear to auscultation bilaterally; normal respiratory effort CV:         Regular rate and rhythm; no murmurs Abd:      + bowel sounds; soft, non-tender; no palpable masses, no distension Ext:    No edema; adequate peripheral perfusion Skin:      Warm and dry; no  rash Neuro: Obtunded, responds minimally to sternal rub.  No gross focal deficits.  Consults: date of consult/date signed off & final recs:    Procedures:   Significant Diagnostic Tests: CT head 8/28- age-related atrophy, chronic microvascular ischemic changes, left ICA surgical clips CT head 8/28-stable findings of the brain, acute on chronic sinusitis.  Chest x-ray 04/27/2018- sternotomy, cardiac valve replacement, atrial clip, cardiomegaly with mild vascular prominence.  Bibasilar atelectasis, infiltrates.  I reviewed the images personally.  Micro Data: Blood culture 8/28-negative Urine culture 8/28- no growth  Antimicrobials:  Ceftriaxone 8/28-8/31 Keflex 8/31>   Resolved Hospital Problem list     Assessment & Plan:  84 year old with severe hypothyroidism, myxedema.  Seen for acute hypercarbic respiratory failure 9/4.  Etiology of his sudden change is unclear. The only new sedating medication is received was Haldol 5 mg on 9/3 Known to have COPD and untreated OSA at baseline which is contributing to his presentation. May also have a component of vol overload as he was hydrated on admission with CXR showing vascular congestion.   Hypercarbic respiratory failure COPD, untreated OSA Not wheezing on examination with no evidence of acute COPD exacerbation. Stop spiriva.  Start duo nebs. Continue BiPAP.  Settings adjusted to 18/8 Lasix 40 mg IV once as he appears vol overloaded on CXR. Check Pct. Suspicion for HCAP is low.   Repeat ABG  in 1 to 2 hours.  May need intubation if there is worsening May be a difficult airway. As per son he had a difficult intubation during surgery 10 yrs ago. No more details are available.  Severe hypothyroidism Continue Synthroid, check cortisol Follow TSH  Anxiety, mood disorder Holding sedating medication.  Stop risperidone at night.  History of DVT, paroxysmal A. fib Continue Eliquis Lasix as above. Cardiology consulted by primary  team.  Disposition / Summary of Today's Plan 05/03/18     Best Practice / Goals of Care / Disposition.   DVT prophylaxis: Eliquis GI prophylaxis: NA Diet: NPO Mobility:Bed Code Status: Full code Family Communication: Son updated at bedside.  Pt would want temporary intubation if needed.   Labs   CBC: Recent Labs  Lab 04/27/18 0416 04/30/18 0458 05/01/18 0425 05/02/18 0432 05/03/18 0428  WBC 6.2 7.5 5.2 5.3 6.1  NEUTROABS 3.4  --   --   --   --   HGB 12.4* 12.0* 11.9* 11.6* 12.8*  HCT 39.4 37.5* 37.5* 36.9* 41.0  MCV 98.3 98.4 98.9 98.7 100.0  PLT 186 193 171 169 171   Basic Metabolic Panel: Recent Labs  Lab 04/27/18 0416  04/29/18 0348 04/30/18 0458 05/01/18 0425 05/02/18 0432 05/03/18 0428  NA 141   < > 138 138 137 138 138  K 4.2   < > 4.2 4.4 3.9 4.1 4.2  CL 100   < > 97* 97* 99 98 97*  CO2 32   < > 30 29 28  32 32  GLUCOSE 125*   < > 113* 109* 96 105* 114*  BUN 25*   < > 18 20 20 21 18   CREATININE 2.34*   < > 2.06* 2.11* 1.86* 2.04* 1.88*  CALCIUM 9.0   < > 8.4* 8.6* 8.3* 8.4* 8.8*  MG 2.1  --   --   --   --   --  2.3  PHOS 2.8   < > 3.2 3.5 3.9 3.6 3.8   < > = values in this interval not displayed.   GFR: Estimated Creatinine Clearance: 42.4 mL/min (A) (by C-G formula based on SCr of 1.88 mg/dL (H)). Recent Labs  Lab 04/30/18 0458 05/01/18 0425 05/02/18 0432 05/03/18 0428  WBC 7.5 5.2 5.3 6.1   Liver Function Tests: Recent Labs  Lab 04/27/18 0416  04/29/18 0348 04/30/18 0458 05/01/18 0425 05/02/18 0432 05/03/18 0428  AST 37  --   --   --   --   --   --   ALT 19  --   --   --   --   --   --   ALKPHOS 80  --   --   --   --   --   --   BILITOT 0.9  --   --   --   --   --   --   PROT 7.1  --   --   --   --   --   --   ALBUMIN 3.9   < > 3.6 3.6 3.3* 3.3* 3.7   < > = values in this interval not displayed.   No results for input(s): LIPASE, AMYLASE in the last 168 hours. No results for input(s): AMMONIA in the last 168 hours. ABG      Component Value Date/Time   PHART 7.220 (L) 05/03/2018 1645   PCO2ART 82.2 (HH) 05/03/2018 1645   PO2ART 75.9 (L) 05/03/2018 1645   HCO3 32.4 (H) 05/03/2018  1645   TCO2 21 02/12/2015 1639   ACIDBASEDEF 2.0 02/12/2015 0334   O2SAT 92.9 05/03/2018 1645    Coagulation Profile: No results for input(s): INR, PROTIME in the last 168 hours. Cardiac Enzymes: No results for input(s): CKTOTAL, CKMB, CKMBINDEX, TROPONINI in the last 168 hours. HbA1C: Hgb A1c MFr Bld  Date/Time Value Ref Range Status  01/28/2018 03:48 AM 5.7 (H) 4.8 - 5.6 % Final    Comment:    (NOTE) Pre diabetes:          5.7%-6.4% Diabetes:              >6.4% Glycemic control for   <7.0% adults with diabetes   04/08/2015 02:28 PM 5.5 4.8 - 5.6 % Final    Comment:             Pre-diabetes: 5.7 - 6.4          Diabetes: >6.4          Glycemic control for adults with diabetes: <7.0    CBG: No results for input(s): GLUCAP in the last 168 hours.   Review of Systems:   Unable to obtain due to altered mental status  Past medical history  He,  has a past medical history of Anxiety, Aortic valve insufficiency, Arthritis, Atrial fibrillation (HCC), Cardiomyopathy (HCC), Chronic kidney disease, Complication of anesthesia, COPD (chronic obstructive pulmonary disease) (HCC), Coronary artery disease, Decreased testosterone level, Depression, Difficult intubation, DVT (deep venous thrombosis) (HCC) (~ 2013), Dysrhythmia, Emphysema of lung (HCC), Enlarged prostate, Falls frequently, GERD (gastroesophageal reflux disease), Heart murmur, History of blood transfusion (1960), History of echocardiogram, Hypercholesteremia, Hypertension, Hypothyroidism, OSA (obstructive sleep apnea), Plantar fasciitis, Right fibular fracture (10/15/2014), Seasonal allergies, Shortness of breath dyspnea, Syncope (10/15/2014), Syncope and collapse ("several times"), Urine frequency, Urine incontinence, and Varicose veins.   Surgical History    Past Surgical  History:  Procedure Laterality Date  . ABDOMINAL AORTIC ANEURYSM REPAIR  01/2006; 03/10/2006   Hattie Perch 01/12/2011  . AORTIC VALVE REPLACEMENT N/A 02/11/2015   Procedure: AORTIC VALVE REPLACEMENT (AVR);  Surgeon: [name], MD;  Location: [facility] OR;  Service: Open Heart Surgery;  Laterality: N/A;  . BRAIN SURGERY  1960 X 4   "S/P got my skull busted"; pt. states removed internal carotid artery  . CARDIAC CATHETERIZATION  1990's X 1; 09/2014   no stents  . CLIPPING OF ATRIAL APPENDAGE N/A 02/11/2015   Procedure: CLIPPING OF ATRIAL APPENDAGE;  Surgeon: [name], MD;  Location[facility] OR;  Service: Open Heart Surgery;  Laterality: N/A;  . ERCP  11/2009   Hattie Perch 12/05/2009  . gall stone    . HERNIA REPAIR    . LAPAROSCOPIC CHOLECYSTECTOMY  08/2009   w/IOC/notes 09/18/2009  . LAPAROSCOPIC INCISIONAL / UMBILICAL / VENTRAL HERNIA REPAIR  02/2010   VHR w/mesh/notes 03/28/2010  . NOSE SURGERY    . TEE WITHOUT CARDIOVERSION N/A 02/11/2015   Procedure: TRANSESOPHAGEAL ECHOCARDIOGRAM (TEE);  Surgeon: [name], MD;  Location: [facility] OR;  Service: Open Heart Surgery;  Laterality: N/A;  . TONSILLECTOMY       Social History   Social History   Socioeconomic History  . Marital status: Married    Spouse name: [name]  . Number of children: 1  . Years of education: Master's  . Highest education level: Not on file  Occupational History  . Not on file  Social Needs  . Financial resource strain: Not on file  . Food insecurity:    Worry: Not  on file    Inability: Not on file  . Transportation needs:    Medical: Not on file    Non-medical: Not on file  Tobacco Use  . Smoking status: Former Smoker    Packs/day: 2.00    Years: 22.00    Pack years: 44.00    Types: Cigarettes    Last attempt to quit: 04/29/1974    Years since quitting: 44.0  . Smokeless tobacco: Never Used  Substance and Sexual Activity  . Alcohol use: Yes    Alcohol/week: 0.0 standard drinks    Comment: 1 beer per year     . Drug use: No  . Sexual activity: Not Currently  Lifestyle  . Physical activity:    Days per week: Not on file    Minutes per session: Not on file  . Stress: Not on file  Relationships  . Social connections:    Talks on phone: Not on file    Gets together: Not on file    Attends religious service: Not on file    Active member of club or organization: Not on file    Attends meetings of clubs or organizations: Not on file    Relationship status: Not on file  . Intimate partner violence:    Fear of current or ex partner: Not on file    Emotionally abused: Not on file    Physically abused: Not on file    Forced sexual activity: Not on file  Other Topics Concern  . Not on file  Social History Narrative   Lives at home with wife.   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   Right handed.   Caffeine use: 3 cups coffee per day.   Drinks 4 cups tea per day   Drinks soda very rarely   ,  reports that he quit smoking about 44 years ago. His smoking use included cigarettes. He has a 44.00 pack-year smoking history. He has never used smokeless tobacco. He reports that he drinks alcohol. He reports that he does not use drugs.   Family history   His family history includes Breast cancer in his sister; Cancer in his sister; Depression in his sister; Diabetes in his mother; Heart disease in his father; Kidney disease in his father; Kidney failure in his brother; Other in his sister; Rheum arthritis in his father and mother; Stroke in his sister. There is no history of Colon cancer, Colon polyps, or Liver disease.   Allergies No Known Allergies  Home meds  Prior to Admission medications   Medication Sig Start Date End Date Taking? Authorizing Provider  apixaban (ELIQUIS) 2.5 MG TABS tablet Take 1 tablet (2.5 mg total) by mouth 2 (two) times daily. 02/21/18  Yes Nahser, Deloris Ping, MD  atorvastatin  (LIPITOR) 10 MG tablet Take 1 tablet (10 mg total) by mouth daily. 02/28/15  Yes Angiulli, Mcarthur Rossetti, PA-C  buPROPion (WELLBUTRIN XL) 300 MG 24 hr tablet Take 1 tablet (300 mg total) by mouth daily. 02/28/15  Yes Angiulli, Mcarthur Rossetti, PA-C  busPIRone (BUSPAR) 15 MG tablet Take 1 tablet (15 mg total) by mouth 2 (two) times daily. 02/28/15  Yes Angiulli, Mcarthur Rossetti, PA-C  carvedilol (COREG) 25 MG tablet Take 25 mg by mouth 2 (two) times daily with a meal.   Yes [provider]  clonazePAM (KLONOPIN) 0.5 MG tablet Take 1 tablet (0.5 mg total) by mouth  at bedtime. 02/28/15  Yes Angiulli, Mcarthur Rossetti, PA-C  dutasteride (AVODART) 0.5 MG capsule Take 1 capsule (0.5 mg total) by mouth daily. 07/16/15  Yes Ranelle Oyster, MD  escitalopram (LEXAPRO) 10 MG tablet Take 10 mg by mouth daily. 01/21/18  Yes [provider]  folic acid (FOLVITE) 1 MG tablet Take 1 tablet (1 mg total) by mouth daily. 08/13/15  Yes Ranelle Oyster, MD  furosemide (LASIX) 20 MG tablet Take 1 tablet (20 mg total) by mouth daily as needed for fluid. Only if you gain > 3 lb in 24 hrs 01/29/18  Yes Rizwan, Ladell Heads, MD  ipratropium-albuterol (DUONEB) 0.5-2.5 (3) MG/3ML SOLN Take 3 mLs by nebulization every 4 (four) hours as needed (for wheezing associated with shortness of breath). 01/29/18  Yes Calvert Cantor, MD  lamoTRIgine (LAMICTAL) 100 MG tablet Take 1 tablet (100 mg total) by mouth 2 (two) times daily. Breakfast and dinner Patient taking differently: Take 100-200 mg by mouth 2 (two) times daily. 200 MG for Breakfast and 100 MG for dinner 02/28/15  Yes Angiulli, Mcarthur Rossetti, PA-C  levothyroxine (SYNTHROID, LEVOTHROID) 175 MCG tablet Take 175 mcg by mouth daily before breakfast.    Yes [provider]  mirtazapine (REMERON) 7.5 MG tablet Take 15 mg by mouth at bedtime.  10/13/15  Yes [provider]  omeprazole (PRILOSEC) 20 MG capsule Take 1 capsule (20 mg total) by mouth daily. 12/08/16  Yes Lupita Leash, MD  potassium  chloride (K-DUR) 10 MEQ tablet TAKE 1 TABLET BY MOUTH EVERY DAY. KEEP UPCOMING APPOINTMENT IN JULY FOR FUTURE REFILLS. 04/07/18  Yes Nahser, Deloris Ping, MD  Tiotropium Bromide-Olodaterol (STIOLTO RESPIMAT) 2.5-2.5 MCG/ACT AERS Inhale 2 puffs into the lungs daily. 02/23/17  Yes Lupita Leash, MD  calcium carbonate (TUMS - DOSED IN MG ELEMENTAL CALCIUM) 500 MG chewable tablet Chew 1 tablet (200 mg of elemental calcium total) by mouth 3 (three) times daily as needed for indigestion or heartburn. Patient not taking: Reported on 04/26/2018 01/29/18   Calvert Cantor, MD  famotidine (PEPCID) 20 MG tablet Take 1 tablet (20 mg total) by mouth 2 (two) times daily as needed for heartburn or indigestion. Patient not taking: Reported on 04/26/2018 01/29/18 01/29/19  Calvert Cantor, MD  guaiFENesin (MUCINEX) 600 MG 12 hr tablet Take 1 tablet (600 mg total) by mouth 2 (two) times daily. Patient not taking: Reported on 04/26/2018 01/29/18   Calvert Cantor, MD  polyethylene glycol (MIRALAX / GLYCOLAX) packet Take 17 g by mouth daily as needed for mild constipation. Patient not taking: Reported on 04/26/2018 01/29/18   Calvert Cantor, MD  senna (SENOKOT) 8.6 MG TABS tablet Take 1 tablet (8.6 mg total) by mouth 2 (two) times daily. Patient not taking: Reported on 03/17/2018 01/29/18   Calvert Cantor, MD  tolnaftate (TINACTIN) 1 % powder Apply topically 2 (two) times daily. Patient not taking: Reported on 04/26/2018 01/29/18   Calvert Cantor, MD     The patient is critically ill with multiple organ system failure and requires high complexity decision making for assessment and support, frequent evaluation and titration of therapies, advanced monitoring, review of radiographic studies and interpretation of complex data.   Critical Care Time devoted to patient care services, exclusive of separately billable procedures, described in this note is 35 minutes.   Chilton Greathouse MD [facility] 05/03/2018, 6:13 PM

## 2018-05-03 NOTE — Progress Notes (Addendum)
PROGRESS NOTE    [name] [name]  ZOX:[id number] DOB: [birth year] DOA: 04/26/2018 PCP: Henrine Screws, MD   Brief Narrative:  HPI on 04/26/2018 by Dr. Pearson Grippe  [name] [name]  is a 84 y.o. male, w hypertension, yperlipidemia, Pafib, Aortic insufficiency, CKD stage 3, h/o DVT, Copd, OSA apparently c/o altered mental status.  Apparently left house around noon and ended up in [place], and his son did a missing person, Silver Alert.  Pt seemed more confused than normal.   Interim history Patient admitted for acute metabolic and toxic encephalopathy.  He was found to have UTI as well as severe hypothyroidism which have both been treated.  So been evaluated by physical and occupational therapy that recommended SNF placement. Assessment & Plan   Acute metabolic and toxic encephalopathy -Suspect multifactorial including UTI and severe hypothyroidism -Patient presented with severe confusion and was actually found to driven to a different city.  A silver alert was called on the patient.  Initial diagnosis was acute kidney injury as well as UTI and polypharmacy.  Further work-up revealed severe hypothyroidism with a TSH of 157, rechecked to be 138 as well as a free T4 level of 0.2. -Patient was placed on aggressive hydration.  His renal function did improve. -He was placed on IV Synthroid, amount was increased to IV (on orally at home). -Patient was also given 1 dose of oral T3 on 04/30/18 ( cytomel) -also received solumedrol 1g -Hospitalist discussed with patient's family, patient has been becoming more confused lately which is been worsening over the past several months.  It seems that patient was not taking his Synthroid regularly. -CT head unremarkable x 2 -UA was positive for UTI and treated with IV ceftriaxone, transitioned to oral Keflex -Urine culture negative -ABG was checked and showed no evidence of hypercarbia. (Of note, patient does not tolerate his CPAP nightly) -Discussed  with endocrinology- recommended checking cortisol level in the morning. Feels mental status should have improved. May need additional T3 or steroids -Discussed with neurology, Dr. Amada Jupiter, recommended speaking with endocrinology and possibly MRI  -Addendum: Discussed with radiology, it seems patient had several surgeries in the 1960s, including a clip-therefore cannot obtain MRI. -Will transfer patient to stepdown, obtain 12 lead, ABG  Cardiac/sinus pauses -Suspect secondary to hypothyroidism -will order magnesium level and monitor closely  -will discuss with cardiology and order 12lead -?secondary to uncontrolled OSA  Severe hypothyroidism -Treatment and plan as noted above -Patient will need to establish care with endocrinologist for further monitoring as an outpatient  Anxiety/mood disorder -home medications: Wellbutrin, BuSpar, Lexapro, Remeron, Lamictal -Suspect his medications are too much for an 84 year old -Currently on Risperdal and Haldol as needed  BPH -Continue flomax   COPD/Acute hypoxia/OSA noncompliant with CPAP -Appears to be stable -continue supplemental O2 as needed -continue duonebs as needed  History of DVT/Paroxysmal Atrial fibrillation -s/p left atrial appendage device placement  -continue Eliquis  ADDENDUM: ABG returned showing pH 7.22/pCO2 82.2, pO2 75.9- have ordered BiPAP and consulted PCCM. Will order ABG in one hour.  DVT Prophylaxis  Eliquis  Code Status: Full  Family Communication: None at bedside. Son via phone  Disposition Plan: Admitted  Consultants Neurology, via phone, Dr. Amada Jupiter Endocrinology, via phone, Dr. Carlus Pavlov  Procedures  None  Antibiotics   Anti-infectives (From admission, onward)   Start     Dose/Rate Route Frequency Ordered Stop   04/29/18 1000  cephALEXin (KEFLEX) capsule 500 mg     500 mg Oral Every 12 hours  04/29/18 0841     04/27/18 0600  cefTRIAXone (ROCEPHIN) 1 g in sodium chloride 0.9 % 100 mL  IVPB  Status:  Discontinued     1 g 200 mL/hr over 30 Minutes Intravenous Every 24 hours 04/26/18 0638 04/29/18 0840   04/26/18 0530  cefTRIAXone (ROCEPHIN) 1 g in sodium chloride 0.9 % 100 mL IVPB     1 g 200 mL/hr over 30 Minutes Intravenous  Once 04/26/18 0522 04/26/18 5621      Subjective:   [name] [name] seen and examined today.  Not very interactive today. States good morning however mumbles answers to questions afterwards.  Objective:   Vitals:   05/03/18 0543 05/03/18 1238 05/03/18 1319 05/03/18 1324  BP: 118/67 108/67    Pulse: 71 (!) 55    Resp: 20 18    Temp: 97.7 F (36.5 C) 97.9 F (36.6 C)    TempSrc: Oral Oral    SpO2: 96% 92% 93% 93%  Weight:      Height:        Intake/Output Summary (Last 24 hours) at 05/03/2018 1514 Last data filed at 05/03/2018 1236 Gross per 24 hour  Intake 0 ml  Output 100 ml  Net -100 ml   Filed Weights   05/01/18 0647 05/02/18 0500 05/03/18 0500  Weight: 134.4 kg (!) 140.5 kg (!) 138.7 kg    Exam  General: Well developed, well nourished, NAD, appears stated age  HEENT: NCAT, mucous membranes moist.   Neck: Supple  Cardiovascular: S1 S2 auscultated, soft murmur, irregular  Respiratory: Clear to auscultation bilaterally with equal chest rise  Abdomen: Soft, obese, nontender, nondistended, + bowel sounds  Extremities: warm dry without cyanosis clubbing or edema  Neuro: Cannot fully assess. Awake. Moves all extremities   Data Reviewed: I have personally reviewed following labs and imaging studies  CBC: Recent Labs  Lab 04/27/18 0416 04/30/18 0458 05/01/18 0425 05/02/18 0432 05/03/18 0428  WBC 6.2 7.5 5.2 5.3 6.1  NEUTROABS 3.4  --   --   --   --   HGB 12.4* 12.0* 11.9* 11.6* 12.8*  HCT 39.4 37.5* 37.5* 36.9* 41.0  MCV 98.3 98.4 98.9 98.7 100.0  PLT 186 193 171 169 171   Basic Metabolic Panel: Recent Labs  Lab 04/27/18 0416  04/29/18 0348 04/30/18 0458 05/01/18 0425 05/02/18 0432 05/03/18 0428  NA 141    < > 138 138 137 138 138  K 4.2   < > 4.2 4.4 3.9 4.1 4.2  CL 100   < > 97* 97* 99 98 97*  CO2 32   < > 30 29 28  32 32  GLUCOSE 125*   < > 113* 109* 96 105* 114*  BUN 25*   < > 18 20 20 21 18   CREATININE 2.34*   < > 2.06* 2.11* 1.86* 2.04* 1.88*  CALCIUM 9.0   < > 8.4* 8.6* 8.3* 8.4* 8.8*  MG 2.1  --   --   --   --   --   --   PHOS 2.8   < > 3.2 3.5 3.9 3.6 3.8   < > = values in this interval not displayed.   GFR: Estimated Creatinine Clearance: 42.2 mL/min (A) (by C-G formula based on SCr of 1.88 mg/dL (H)). Liver Function Tests: Recent Labs  Lab 04/27/18 0416  04/29/18 0348 04/30/18 0458 05/01/18 0425 05/02/18 0432 05/03/18 0428  AST 37  --   --   --   --   --   --  ALT 19  --   --   --   --   --   --   ALKPHOS 80  --   --   --   --   --   --   BILITOT 0.9  --   --   --   --   --   --   PROT 7.1  --   --   --   --   --   --   ALBUMIN 3.9   < > 3.6 3.6 3.3* 3.3* 3.7   < > = values in this interval not displayed.   No results for input(s): LIPASE, AMYLASE in the last 168 hours. No results for input(s): AMMONIA in the last 168 hours. Coagulation Profile: No results for input(s): INR, PROTIME in the last 168 hours. Cardiac Enzymes: No results for input(s): CKTOTAL, CKMB, CKMBINDEX, TROPONINI in the last 168 hours. BNP (last 3 results) No results for input(s): PROBNP in the last 8760 hours. HbA1C: No results for input(s): HGBA1C in the last 72 hours. CBG: No results for input(s): GLUCAP in the last 168 hours. Lipid Profile: No results for input(s): CHOL, HDL, LDLCALC, TRIG, CHOLHDL, LDLDIRECT in the last 72 hours. Thyroid Function Tests: No results for input(s): TSH, T4TOTAL, FREET4, T3FREE, THYROIDAB in the last 72 hours. Anemia Panel: No results for input(s): VITAMINB12, FOLATE, FERRITIN, TIBC, IRON, RETICCTPCT in the last 72 hours. Urine analysis:    Component Value Date/Time   COLORURINE AMBER (A) 04/26/2018 0442   APPEARANCEUR HAZY (A) 04/26/2018 0442   LABSPEC  1.033 (H) 04/26/2018 0442   PHURINE 5.0 04/26/2018 0442   GLUCOSEU NEGATIVE 04/26/2018 0442   HGBUR NEGATIVE 04/26/2018 0442   BILIRUBINUR MODERATE (A) 04/26/2018 0442   KETONESUR 5 (A) 04/26/2018 0442   PROTEINUR 30 (A) 04/26/2018 0442   UROBILINOGEN 2.0 (H) 02/21/2015 1241   NITRITE NEGATIVE 04/26/2018 0442   LEUKOCYTESUR TRACE (A) 04/26/2018 0442   Sepsis Labs: @LABRCNTIP (procalcitonin:4,lacticidven:4)  ) Recent Results (from the past 240 hour(s))  Blood culture (routine x 2)     Status: None   Collection Time: 04/26/18  4:43 AM  Result Value Ref Range Status   Specimen Description   Final    BLOOD LEFT ANTECUBITAL Performed at [facility], [address], [place], [place] [address]    Special Requests   Final    BOTTLES DRAWN AEROBIC AND ANAEROBIC Blood Culture adequate volume Performed at [facility], [address], [place], [place] [address]    Culture   Final    NO GROWTH 5 DAYS Performed at [facility], [address] [place], [place] [address]    Report Status 05/01/2018 FINAL  Final  Urine culture     Status: None   Collection Time: 04/26/18  5:03 AM  Result Value Ref Range Status   Specimen Description   Final    URINE, RANDOM Performed at [facility], [address] [place], [place] [address]    Special Requests   Final    NONE Performed at [facility], [address], [place], [place] [address]    Culture   Final    NO GROWTH Performed at [facility], [address], [place], [place] [address]    Report Status 04/27/2018 FINAL  Final  Blood culture (routine x 2)     Status: None   Collection Time: 04/26/18  7:46 AM  Result Value Ref Range  Status   Specimen Description   Final    BLOOD RIGHT ANTECUBITAL Performed at [facility], [address], [place], [place] [address]    Special Requests   Final    BOTTLES DRAWN AEROBIC AND  ANAEROBIC Blood Culture results may not be optimal due to an inadequate volume of blood received in culture bottles Performed at [facility], [address], [place], [place] [address]    Culture   Final    NO GROWTH 5 DAYS Performed at [facility], [address] [place], [place] [address]    Report Status 05/01/2018 FINAL  Final      Radiology Studies: No results found.   Scheduled Meds: . apixaban  2.5 mg Oral BID  . carvedilol  25 mg Oral BID WC  . cephALEXin  500 mg Oral Q12H  . ipratropium-albuterol  3 mL Nebulization TID  . levothyroxine  100 mcg Intravenous Daily  . risperiDONE  0.5 mg Oral Daily  . risperiDONE  1 mg Oral QHS  . tamsulosin  0.4 mg Oral QPC supper  . topiramate  50 mg Oral Daily   Continuous Infusions:   LOS: 6 days   Time Spent in minutes   45 minutes (greater than 50% of time spent with patient face to face, as well as reviewing old records, calling consults, and formulating a plan)   Edsel Petrin D.O. on 05/03/2018 at 3:14 PM  Between 7am to 7pm - Please see pager noted on [email or web address]  After 7pm go to [email or web address]  And look for the night coverage person covering for me after hours  [facility] Office  [phone]

## 2018-05-03 NOTE — Progress Notes (Addendum)
CMT called at 11:31am to report Slow ventricular responses/pauses @ 09:34am  2.02 sec and 9:53am 2:06 sec, pt stable at the time.  Provider called at current time.  SRP,RN

## 2018-05-03 NOTE — Progress Notes (Signed)
CMT called to state pt had 11 beats of wide QRS/ BBB, texted MD. SRP, RN

## 2018-05-03 NOTE — Progress Notes (Signed)
Pt remain lethargic,  Slow to respond, agonal respirations noted, sleep apnea, pt remain on O2, MD arrives, 12 lead EKG, ABG's ordered, Rapid response called, pt status change to SD. [facility]

## 2018-05-04 ENCOUNTER — Inpatient Hospital Stay (HOSPITAL_COMMUNITY): Payer: Medicare Other

## 2018-05-04 DIAGNOSIS — I4821 Permanent atrial fibrillation: Secondary | ICD-10-CM

## 2018-05-04 DIAGNOSIS — I2510 Atherosclerotic heart disease of native coronary artery without angina pectoris: Secondary | ICD-10-CM

## 2018-05-04 DIAGNOSIS — Z952 Presence of prosthetic heart valve: Secondary | ICD-10-CM

## 2018-05-04 DIAGNOSIS — R001 Bradycardia, unspecified: Secondary | ICD-10-CM

## 2018-05-04 DIAGNOSIS — Z515 Encounter for palliative care: Secondary | ICD-10-CM

## 2018-05-04 DIAGNOSIS — Z7189 Other specified counseling: Secondary | ICD-10-CM

## 2018-05-04 DIAGNOSIS — J9690 Respiratory failure, unspecified, unspecified whether with hypoxia or hypercapnia: Secondary | ICD-10-CM

## 2018-05-04 DIAGNOSIS — R410 Disorientation, unspecified: Secondary | ICD-10-CM

## 2018-05-04 DIAGNOSIS — I482 Chronic atrial fibrillation: Secondary | ICD-10-CM

## 2018-05-04 LAB — CBC
HEMATOCRIT: 36.9 % — AB (ref 39.0–52.0)
Hemoglobin: 11.8 g/dL — ABNORMAL LOW (ref 13.0–17.0)
MCH: 31.1 pg (ref 26.0–34.0)
MCHC: 32 g/dL (ref 30.0–36.0)
MCV: 97.4 fL (ref 78.0–100.0)
Platelets: 155 10*3/uL (ref 150–400)
RBC: 3.79 MIL/uL — ABNORMAL LOW (ref 4.22–5.81)
RDW: 15.4 % (ref 11.5–15.5)
WBC: 6.5 10*3/uL (ref 4.0–10.5)

## 2018-05-04 LAB — COMPREHENSIVE METABOLIC PANEL
ALT: 18 U/L (ref 0–44)
ANION GAP: 16 — AB (ref 5–15)
AST: 26 U/L (ref 15–41)
Albumin: 3.4 g/dL — ABNORMAL LOW (ref 3.5–5.0)
Alkaline Phosphatase: 65 U/L (ref 38–126)
BILIRUBIN TOTAL: 0.8 mg/dL (ref 0.3–1.2)
BUN: 21 mg/dL (ref 8–23)
CO2: 27 mmol/L (ref 22–32)
Calcium: 8.8 mg/dL — ABNORMAL LOW (ref 8.9–10.3)
Chloride: 102 mmol/L (ref 98–111)
Creatinine, Ser: 1.92 mg/dL — ABNORMAL HIGH (ref 0.61–1.24)
GFR, EST AFRICAN AMERICAN: 35 mL/min — AB (ref >60)
GFR, EST NON AFRICAN AMERICAN: 30 mL/min — AB (ref >60)
Glucose, Bld: 86 mg/dL (ref 70–99)
POTASSIUM: 4.5 mmol/L (ref 3.5–5.1)
Sodium: 145 mmol/L (ref 135–145)
TOTAL PROTEIN: 6.3 g/dL — AB (ref 6.5–8.1)

## 2018-05-04 LAB — PROCALCITONIN

## 2018-05-04 LAB — BLOOD GAS, ARTERIAL
Acid-Base Excess: 4.7 mmol/L — ABNORMAL HIGH (ref 0.0–2.0)
Bicarbonate: 29.1 mmol/L — ABNORMAL HIGH (ref 20.0–28.0)
Delivery systems: POSITIVE
Drawn by: 232811
EXPIRATORY PAP: 8
FIO2: 30
INSPIRATORY PAP: 18
O2 SAT: 96.1 %
PO2 ART: 76.5 mmHg — AB (ref 83.0–108.0)
Patient temperature: 97.9
pCO2 arterial: 43.2 mmHg (ref 32.0–48.0)
pH, Arterial: 7.441 (ref 7.350–7.450)

## 2018-05-04 LAB — HEPARIN LEVEL (UNFRACTIONATED): Heparin Unfractionated: >2.2 IU/mL — ABNORMAL HIGH (ref 0.30–0.70)

## 2018-05-04 LAB — MRSA PCR SCREENING: MRSA BY PCR: NEGATIVE

## 2018-05-04 LAB — CORTISOL: Cortisol, Plasma: 14.6 ug/dL

## 2018-05-04 LAB — APTT: APTT: 28 s (ref 24–36)

## 2018-05-04 MED ORDER — FLEET ENEMA 7-19 GM/118ML RE ENEM
1.0000 | ENEMA | Freq: Once | RECTAL | Status: AC
  Administered 2018-05-04: 1 via RECTAL
  Filled 2018-05-04: qty 1

## 2018-05-04 MED ORDER — CEFAZOLIN SODIUM-DEXTROSE 1-4 GM/50ML-% IV SOLN
1.0000 g | Freq: Three times a day (TID) | INTRAVENOUS | Status: DC
  Administered 2018-05-04 – 2018-05-05 (×3): 1 g via INTRAVENOUS
  Filled 2018-05-04 (×4): qty 50

## 2018-05-04 MED ORDER — FUROSEMIDE 10 MG/ML IJ SOLN
40.0000 mg | Freq: Two times a day (BID) | INTRAMUSCULAR | Status: DC
  Administered 2018-05-04 (×2): 40 mg via INTRAVENOUS
  Filled 2018-05-04 (×2): qty 4

## 2018-05-04 MED ORDER — CHLORHEXIDINE GLUCONATE 0.12 % MT SOLN
15.0000 mL | Freq: Two times a day (BID) | OROMUCOSAL | Status: DC
  Administered 2018-05-04 – 2018-05-10 (×12): 15 mL via OROMUCOSAL
  Filled 2018-05-04 (×12): qty 15

## 2018-05-04 MED ORDER — HEPARIN (PORCINE) IN NACL 100-0.45 UNIT/ML-% IJ SOLN
1650.0000 [IU]/h | INTRAMUSCULAR | Status: DC
  Administered 2018-05-04: 1650 [IU]/h via INTRAVENOUS
  Filled 2018-05-04: qty 250

## 2018-05-04 MED ORDER — INFLUENZA VAC SPLIT HIGH-DOSE 0.5 ML IM SUSY
0.5000 mL | PREFILLED_SYRINGE | INTRAMUSCULAR | Status: AC
  Administered 2018-05-05: 0.5 mL via INTRAMUSCULAR
  Filled 2018-05-04: qty 0.5

## 2018-05-04 MED ORDER — FENTANYL CITRATE (PF) 100 MCG/2ML IJ SOLN: 50.0000 ug | INTRAMUSCULAR | Status: DC | PRN

## 2018-05-04 MED ORDER — ORAL CARE MOUTH RINSE
15.0000 mL | Freq: Two times a day (BID) | OROMUCOSAL | Status: DC
  Administered 2018-05-04 – 2018-05-09 (×6): 15 mL via OROMUCOSAL

## 2018-05-04 NOTE — Progress Notes (Signed)
Pt awake and alert for short period of time. During that time, taken off Bi-Pap. Placed on 5L [place]. O2 sats stayed mid to upper 90s for 25 mins. Placed back on Bi-Pap when pt no longer able to stay awake.

## 2018-05-04 NOTE — Consult Note (Signed)
Cardiology Consultation:   Patient ID: [name] [name]; [id number]; [birth year]   Admit date: 04/26/2018 Date of Consult: 05/04/2018  Primary Care Provider: Aura Dials, MD Primary Cardiologist: Dr. Mertie Moores, MD  Patient Profile:   [name] [name] is a 84 y.o. male with a hx of hypertension hyperlipidemia, persistent atrial fibrillation on Eliquis (s/p atrial clipping 2016), aortic insufficiency status post repair (2016), AAA s/p repair 2007, chronic kidney disease stage III, hx of DVT (2013), COPD on home O2, OSA noncompliant with CPAP, known RCA CAD not bypassed secondary to lack of a septal conduit chronic diastolic CHF who is being seen today for the evaluation of questionable sinus pauses at the request of Dr. Ree Kida.  History of Present Illness:   [name] [name] is an 84 year old male with a history stated above who was initially admitted to [facility] on 04/26/18 secondary to AMS found to have UTI, as well as severe hypothyroidism and AKI. Unfortunately, he went into hypercarbic respiratory failure requiring PCCM consultation and Bipap ventilation. On telemetry, patient began having sinus pauses on  05/03/18 and primary team ordered magnesium level (found to be WNL at 2.3) as well as twelve-lead EKG which revealed atrial fibrillation HR 67 and evidence of LBBB, similar to prior tracings. Cardiology was consulted for further recommendations. Currently, pt is unresponsive to commands. Family at bedside asking about code status and possible palliative care consultation. Family denies recent complaints of chest pain, palpations, dizziness or syncope.   Of note, he was last seen by his primary cardiologist on 03/17/2017 for follow-up of AVR, CHF and hypertension.  Per chart review, he was followed for many years at [facility]. At last office visit, he was using supplemental O2 secondary to COPD and ambulating with wheelchair for long distance/walker for in-house.  He had last been hospitalized 12/2017 for  orthostatic hypotension and lower extremity leg infection.  Cardiology was consulted after telemtry assessment revealed occasional (no more than) 2.0 second pauses on admission.   Past Medical History:  Diagnosis Date  . Anxiety   . Aortic valve insufficiency   . Arthritis    "wee bit; knees, elbows" (10/15/2014)  . Atrial fibrillation ([place])   . Cardiomyopathy ([place])   . Chronic kidney disease    "down to ~ 1/2 of their regular use; I see kidney dr. in [place]" (10/15/2014)  . Complication of anesthesia    unsure of complications but pt. states there were complications  . COPD (chronic obstructive pulmonary disease) ([place])    on home o2  . Coronary artery disease   . Decreased testosterone level   . Depression   . Difficult intubation    difficult intubation 12/03/09 and 03/27/10 ([facility]); glidescope used 03/27/10  . DVT (deep venous thrombosis) ([place]) ~ 2013   "BLE"  . Dysrhythmia    A. Fib  . Emphysema of lung ([place])   . Enlarged prostate   . Falls frequently    > 20 times in the last year/notes 10/15/2014  . GERD (gastroesophageal reflux disease)   . Heart murmur   . History of blood transfusion 1960   "lots; related to an accident"  . History of echocardiogram    post AVR >> Echo 7/16:  Mild LVH, EF 20-25%, AVR ok (peak 15 mmHg, mean 9 mmHg), MAC, mild MR, severe LAE, mild reduced RVSF, mild RAE, PASP 35 mmHg  . Hypercholesteremia   . Hypertension   . Hypothyroidism   . OSA (obstructive sleep apnea)    "suppose to wear mask;  I throw it off in my sleep" (10/15/2014)  . Plantar fasciitis   . Right fibular fracture 10/15/2014  . Seasonal allergies   . Shortness of breath dyspnea   . Syncope 10/15/2014  . Syncope and collapse "several times"  . Urine frequency   . Urine incontinence   . Varicose veins     Past Surgical History:  Procedure Laterality Date  . ABDOMINAL AORTIC ANEURYSM REPAIR  01/2006; 03/10/2006   Archie Endo 01/12/2011  . AORTIC VALVE REPLACEMENT N/A 02/11/2015    Procedure: AORTIC VALVE REPLACEMENT (AVR);  Surgeon: Grace Isaac, MD;  Location: [facility];  Service: Open Heart Surgery;  Laterality: N/A;  . BRAIN SURGERY  1960 X 4   "S/P got my skull busted"; pt. states removed internal carotid artery  . CARDIAC CATHETERIZATION  1990's X 1; 09/2014   no stents  . CLIPPING OF ATRIAL APPENDAGE N/A 02/11/2015   Procedure: CLIPPING OF ATRIAL APPENDAGE;  Surgeon: Grace Isaac, MD;  Location: [facility];  Service: Open Heart Surgery;  Laterality: N/A;  . ERCP  11/2009   Archie Endo 12/05/2009  . gall stone    . HERNIA REPAIR    . LAPAROSCOPIC CHOLECYSTECTOMY  08/2009   w/IOC/notes 09/18/2009  . LAPAROSCOPIC INCISIONAL / UMBILICAL / VENTRAL HERNIA REPAIR  02/2010   VHR w/mesh/notes 03/28/2010  . NOSE SURGERY    . TEE WITHOUT CARDIOVERSION N/A 02/11/2015   Procedure: TRANSESOPHAGEAL ECHOCARDIOGRAM (TEE);  Surgeon: Grace Isaac, MD;  Location: [facility];  Service: Open Heart Surgery;  Laterality: N/A;  . TONSILLECTOMY       Prior to Admission medications   Medication Sig Start Date End Date Taking? Authorizing Provider  apixaban (ELIQUIS) 2.5 MG TABS tablet Take 1 tablet (2.5 mg total) by mouth 2 (two) times daily. 02/21/18  Yes Nahser, [name], MD  atorvastatin (LIPITOR) 10 MG tablet Take 1 tablet (10 mg total) by mouth daily. 02/28/15  Yes Angiulli, Lavon Paganini, PA-C  buPROPion (WELLBUTRIN XL) 300 MG 24 hr tablet Take 1 tablet (300 mg total) by mouth daily. 02/28/15  Yes Angiulli, Lavon Paganini, PA-C  busPIRone (BUSPAR) 15 MG tablet Take 1 tablet (15 mg total) by mouth 2 (two) times daily. 02/28/15  Yes Angiulli, Lavon Paganini, PA-C  carvedilol (COREG) 25 MG tablet Take 25 mg by mouth 2 (two) times daily with a meal.   Yes [provider]  clonazePAM (KLONOPIN) 0.5 MG tablet Take 1 tablet (0.5 mg total) by mouth at bedtime. 02/28/15  Yes Angiulli, Lavon Paganini, PA-C  dutasteride (AVODART) 0.5 MG capsule Take 1 capsule (0.5 mg total) by mouth daily. 07/16/15  Yes Meredith Staggers, MD    escitalopram (LEXAPRO) 10 MG tablet Take 10 mg by mouth daily. 01/21/18  Yes [provider]  folic acid (FOLVITE) 1 MG tablet Take 1 tablet (1 mg total) by mouth daily. 08/13/15  Yes Meredith Staggers, MD  furosemide (LASIX) 20 MG tablet Take 1 tablet (20 mg total) by mouth daily as needed for fluid. Only if you gain > 3 lb in 24 hrs 01/29/18  Yes Rizwan, Eunice Blase, MD  ipratropium-albuterol (DUONEB) 0.5-2.5 (3) MG/3ML SOLN Take 3 mLs by nebulization every 4 (four) hours as needed (for wheezing associated with shortness of breath). 01/29/18  Yes Debbe Odea, MD  lamoTRIgine (LAMICTAL) 100 MG tablet Take 1 tablet (100 mg total) by mouth 2 (two) times daily. Breakfast and dinner Patient taking differently: Take 100-200 mg by mouth 2 (two) times daily. 200 MG  for Breakfast and 100 MG for dinner 02/28/15  Yes Angiulli, Lavon Paganini, PA-C  levothyroxine (SYNTHROID, LEVOTHROID) 175 MCG tablet Take 175 mcg by mouth daily before breakfast.    Yes [provider]  mirtazapine (REMERON) 7.5 MG tablet Take 15 mg by mouth at bedtime.  10/13/15  Yes [provider]  omeprazole (PRILOSEC) 20 MG capsule Take 1 capsule (20 mg total) by mouth daily. 12/08/16  Yes Juanito Doom, MD  potassium chloride (K-DUR) 10 MEQ tablet TAKE 1 TABLET BY MOUTH EVERY DAY. KEEP UPCOMING APPOINTMENT IN JULY FOR FUTURE REFILLS. 04/07/18  Yes Nahser, [name], MD  Tiotropium Bromide-Olodaterol (STIOLTO RESPIMAT) 2.5-2.5 MCG/ACT AERS Inhale 2 puffs into the lungs daily. 02/23/17  Yes Juanito Doom, MD  calcium carbonate (TUMS - DOSED IN MG ELEMENTAL CALCIUM) 500 MG chewable tablet Chew 1 tablet (200 mg of elemental calcium total) by mouth 3 (three) times daily as needed for indigestion or heartburn. Patient not taking: Reported on 04/26/2018 01/29/18   Debbe Odea, MD  famotidine (PEPCID) 20 MG tablet Take 1 tablet (20 mg total) by mouth 2 (two) times daily as needed for heartburn or indigestion. Patient not taking:  Reported on 04/26/2018 01/29/18 01/29/19  Debbe Odea, MD  guaiFENesin (MUCINEX) 600 MG 12 hr tablet Take 1 tablet (600 mg total) by mouth 2 (two) times daily. Patient not taking: Reported on 04/26/2018 01/29/18   Debbe Odea, MD  polyethylene glycol (MIRALAX / GLYCOLAX) packet Take 17 g by mouth daily as needed for mild constipation. Patient not taking: Reported on 04/26/2018 01/29/18   Debbe Odea, MD  senna (SENOKOT) 8.6 MG TABS tablet Take 1 tablet (8.6 mg total) by mouth 2 (two) times daily. Patient not taking: Reported on 03/17/2018 01/29/18   Debbe Odea, MD  tolnaftate (TINACTIN) 1 % powder Apply topically 2 (two) times daily. Patient not taking: Reported on 04/26/2018 01/29/18   Debbe Odea, MD    Inpatient Medications: Scheduled Meds: . apixaban  2.5 mg Oral BID  . carvedilol  25 mg Oral BID WC  . cephALEXin  500 mg Oral Q12H  . chlorhexidine  15 mL Mouth Rinse BID  . ipratropium-albuterol  3 mL Nebulization TID  . levothyroxine  100 mcg Intravenous Daily  . mouth rinse  15 mL Mouth Rinse q12n4p  . risperiDONE  0.25 mg Oral Daily  . risperiDONE  0.5 mg Oral QHS  . tamsulosin  0.4 mg Oral QPC supper   Continuous Infusions:  PRN Meds: acetaminophen **OR** acetaminophen, albuterol, haloperidol lactate, hydrALAZINE  Allergies:   No Known Allergies  Social History:   Social History   Socioeconomic History  . Marital status: Married    Spouse name: Joycelyn Schmid  . Number of children: 1  . Years of education: Master's  . Highest education level: Not on file  Occupational History  . Not on file  Social Needs  . Financial resource strain: Not on file  . Food insecurity:    Worry: Not on file    Inability: Not on file  . Transportation needs:    Medical: Not on file    Non-medical: Not on file  Tobacco Use  . Smoking status: Former Smoker    Packs/day: 2.00    Years: 22.00    Pack years: 44.00    Types: Cigarettes    Last attempt to quit: 04/29/1974    Years since  quitting: 44.0  . Smokeless tobacco: Never Used  Substance and Sexual Activity  . Alcohol use:  Yes    Alcohol/week: 0.0 standard drinks    Comment: 1 beer per year   . Drug use: No  . Sexual activity: Not Currently  Lifestyle  . Physical activity:    Days per week: Not on file    Minutes per session: Not on file  . Stress: Not on file  Relationships  . Social connections:    Talks on phone: Not on file    Gets together: Not on file    Attends religious service: Not on file    Active member of club or organization: Not on file    Attends meetings of clubs or organizations: Not on file    Relationship status: Not on file  . Intimate partner violence:    Fear of current or ex partner: Not on file    Emotionally abused: Not on file    Physically abused: Not on file    Forced sexual activity: Not on file  Other Topics Concern  . Not on file  Social History Narrative   Lives at home with wife.   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   Right handed.   Caffeine use: 3 cups coffee per day.   Drinks 4 cups tea per day   Drinks soda very rarely     Family History:   Family History  Problem Relation Age of Onset  . Rheum arthritis Mother   . Diabetes Mother   . Heart disease Father   . Rheum arthritis Father   . Kidney disease Father   . Kidney failure Brother   . Breast cancer Sister   . Other Sister        Murdered  . Depression Sister   . Cancer Sister   . Stroke Sister   . Colon cancer Neg Hx   . Colon polyps Neg Hx   . Liver disease Neg Hx    Family Status:  Family Status  Relation Name Status  . Mother  Deceased at age 29       Heart problems  . Father  Deceased at age 1       Heart problems , kidney problems   . Brother  Deceased at age 63  . Brother  Alive  . Sister  Deceased  . Sister  Deceased  . Sister  Alive  . MGM  Deceased  . MGF  Deceased  . PGM   Deceased  . PGF  Deceased  . Sister  (Not Specified)  . Sister  (Not Specified)  . Neg Hx  (Not Specified)    ROS:  Please see the history of present illness.  All other ROS reviewed and negative.     Physical Exam/Data:   Vitals:   05/04/18 0348 05/04/18 0400 05/04/18 0500 05/04/18 0600  BP:  116/81 (!) 77/41 114/84  Pulse:      Resp:  16 19 13   Temp: 97.9 F (36.6 C)     TempSrc: Axillary     SpO2:  99% 98% 99%  Weight:      Height:        Intake/Output Summary (Last 24 hours) at 05/04/2018 0732 Last data filed at 05/03/2018 1236 Gross per 24 hour  Intake -  Output 100 ml  Net -100 ml   Filed Weights   05/02/18 0500 05/03/18 0500 05/03/18 1711  Weight: (!) 140.5 kg (!) 138.7 kg (!) 139.7 kg  Body mass index is 41.77 kg/m.   General: Elderly, frail, NAD Skin: Warm, dry, intact  Head: Normocephalic, atraumatic, clear, moist mucus membranes. Neck: Negative for carotid bruits. No JVD Lungs:Rhonchus bilaterally.Bipap in place. Breathing is unlabored. Cardiovascular: Irregularly irregular with S1 S2. No murmurs, rubs, gallops, or LV heave appreciated. Abdomen: Soft, non-tender, non-distended with normoactive bowel sounds.  No obvious abdominal masses. MSK: Strength and tone appear normal for age. 5/5 in all extremities Extremities: No edema. No clubbing or cyanosis. DP/PT pulses 2+ bilaterally Neuro: On Bipap. No focal deficits. No facial asymmetry. MAE spontaneously. Psych: On Bipap  EKG:  The EKG was personally reviewed and demonstrates: 05/03/2018: Atrial fibrillation with LBBB, similar to prior tracings Telemetry:  Telemetry was personally reviewed and demonstrates: Atrial fibrillation HR 70-90's, no evidence of pauses  Relevant CV Studies:  ECHO: 01/28/2018: Study Conclusions  - Left ventricle: The cavity size was normal. Systolic function was   normal. The estimated ejection fraction was in the range of 60%   to 65%. Wall motion was normal; there were no  regional wall   motion abnormalities. Doppler parameters are consistent with high   ventricular filling pressure. - Aortic valve: A prosthesis was present and functioning normally.   The prosthesis had a normal range of motion. The sewing ring   appeared normal, had no rocking motion, and showed no evidence of   dehiscence. Peak velocity (S): 254 cm/s. Mean gradient (S): 13 mm   Hg. Valve area (VTI): 1.91 cm^2. Valve area (Vmax): 1.8 cm^2.   Valve area (Vmean): 1.94 cm^2. - Aorta: Ascending aortic diameter: 42 mm (S). - Ascending aorta: The ascending aorta was mildly dilated. - Mitral valve: There was mild regurgitation. - Left atrium: The atrium was severely dilated. - Right ventricle: The cavity size was moderately dilated. Wall   thickness was normal. - Right atrium: The atrium was severely dilated. - Pulmonary arteries: Systolic pressure was mildly increased.  Impressions:  - EF is improved when compared to prior  CATH: Remote  Laboratory Data:  Chemistry Recent Labs  Lab 05/02/18 0432 05/03/18 0428 05/04/18 0333  NA 138 138 145  K 4.1 4.2 4.5  CL 98 97* 102  CO2 32 32 27  GLUCOSE 105* 114* 86  BUN 21 18 21   CREATININE 2.04* 1.88* 1.92*  CALCIUM 8.4* 8.8* 8.8*  GFRNONAA 28* 31* 30*  GFRAA 33* 36* 35*  ANIONGAP 8 9 16*    Total Protein  Date Value Ref Range Status  05/04/2018 6.3 (L) 6.5 - 8.1 g/dL Final  04/08/2015 7.1 6.0 - 8.5 g/dL Final   Albumin  Date Value Ref Range Status  05/04/2018 3.4 (L) 3.5 - 5.0 g/dL Final  04/08/2015 4.2 3.5 - 4.7 g/dL Final   AST  Date Value Ref Range Status  05/04/2018 26 15 - 41 U/L Final   ALT  Date Value Ref Range Status  05/04/2018 18 0 - 44 U/L Final   Alkaline Phosphatase  Date Value Ref Range Status  05/04/2018 65 38 - 126 U/L Final   Total Bilirubin  Date Value Ref Range Status  05/04/2018 0.8 0.3 - 1.2 mg/dL Final   Bilirubin Total  Date Value Ref Range Status  04/08/2015 0.2 0.0 - 1.2 mg/dL Final     Hematology Recent Labs  Lab 05/02/18 0432 05/03/18 0428 05/04/18 0333  WBC 5.3 6.1 6.5  RBC 3.74* 4.10* 3.79*  HGB 11.6* 12.8* 11.8*  HCT 36.9* 41.0 36.9*  MCV 98.7 100.0 97.4  MCH  31.0 31.2 31.1  MCHC 31.4 31.2 32.0  RDW 15.3 15.4 15.4  PLT 169 171 155   Cardiac EnzymesNo results for input(s): TROPONINI in the last 168 hours. No results for input(s): TROPIPOC in the last 168 hours.  BNPNo results for input(s): BNP, PROBNP in the last 168 hours.  DDimer No results for input(s): DDIMER in the last 168 hours. TSH:  Lab Results  Component Value Date   TSH 138.414 (H) 04/27/2018   Lipids: Lab Results  Component Value Date   CHOL 118 10/10/2015   HDL 29 (A) 10/10/2015   LDLCALC 50 10/10/2015   TRIG 195 (A) 10/10/2015   HgbA1c: Lab Results  Component Value Date   HGBA1C 5.7 (H) 01/28/2018    Radiology/Studies:  Dg Chest Port 1 View  Result Date: 05/03/2018 CLINICAL DATA:  Hypertension atrial fibrillation EXAM: PORTABLE CHEST 1 VIEW COMPARISON:  04/27/2018, 04/26/2018, 01/27/2018 FINDINGS: Post sternotomy changes with valve replacement. Enlarged cardiomediastinal silhouette, gad rated by rotation. Mild central vascular congestion. Probable pleural effusions. Bibasilar airspace disease. No pneumothorax. IMPRESSION: 1. Cardiomegaly with vascular congestion 2. Suspected pleural effusions. Hazy bibasilar airspace disease which may reflect atelectasis or pneumonia. Electronically Signed   By: Donavan Foil M.D.   On: 05/03/2018 19:45    Assessment and Plan:   1.  Resumed sinus pausing: -Cardiology consulted secondary to questionable sinus pauses on admission  -Per tele review and discussion with remote cardiac tele service, no pauses for at least the last 12 hours. Pauses noted on admission were no greater than 2.0 seconds in duration  -Likely in the setting of persistent atrial fibrillation with acute illness and severe hypothyroidism -Electrolytes within normal limits,  magnesium, 2.3 -Continue to hold BB for now and monitor -Monitor telemetry closely -In the setting of acute illness and asymptomatic, will continue to closely monitor for now. If significant pauses occur or is he becomes symptomatic, can consider further evaluation -Family at bedside asking about code status and possible palliative care consultation>>will leave this up to primary team    2.  Acute hypercarbic respiratory failure: -Initially admitted on 04/26/2018 for altered mental status found to have UTI, CT negative for acute findings.  PCCM consulted 05/03/2018 secondary to increased lethargy with hypercarbic respiratory failure requiring BiPAP ventilation -Given IV Lasix 40 mg x 1 for fluid volume overload per CXR -Consider additional dose of IV Lasix if CXR similar or worsened  -Continue current regimen per primary team, PCCM  3.  Severe hypothyroidism: -Patient with a known history of hypothyroidism, TSH on admission>> 138.414 -Baseline appears to be variable ranging from 3.5-157.5 -Cortisol, -Continue Synthroid 100 mcg daily  4.  Persistent atrial fibrillation: -Patient with known persistent atrial fibrillation on reduced dose Eliquis 2.5 mg twice daily -Carvedilol 25 mg twice daily for rate control>>hold  -CHA2DS2VASc =7 (age, CHF, hypertension, thromboembolism, vascular disease)  5.  Acute on chronic kidney disease stage III: -Creatinine, 1.92 today -Baseline appears to be 1.6-1.7 however, peak creatinine this admission 2.34 -Continue to avoid nephrotoxic medications -Follow function closely  6.  History of AVR 2016: -Followed by cardiology -Stable  7.  History of CAD/chronic diastolic CHF: -Per chart review, known RCA occlusion however unable to find cardiac cath report -Last echocardiogram 01/28/2018 with LVEF 60 to 65% with normal wall motion, noted to be improved from prior study   For questions or updates, please contact [place] Please consult [email or web address] for  contact info under Cardiology/STEMI.   SignedKathyrn Drown NP-C [place] Pager: [phone] 05/04/2018  7:32 AM

## 2018-05-04 NOTE — Progress Notes (Addendum)
ANTICOAGULATION CONSULT NOTE - Initial Consult  Pharmacy Consult for IV heparin (while apixaban on hold) Indication: atrial fibrillation, hx of DVT  Allergies  Allergen Reactions  . Haldol [Haloperidol Lactate]     Over-sedation and at times agitation     Patient Measurements: Height: 6' (182.9 cm) Weight: (!) 307 lb 15.7 oz (139.7 kg) IBW/kg (Calculated) : 77.6 Heparin Dosing Weight: 109.8kg  Vital Signs: Temp: 97.7 F (36.5 C) (09/05 0800) Temp Source: Axillary (09/05 0800) BP: 114/84 (09/05 0600) Pulse Rate: 64 (09/05 0823)  Labs: Recent Labs    05/02/18 0432 05/03/18 0428 05/04/18 0333  HGB 11.6* 12.8* 11.8*  HCT 36.9* 41.0 36.9*  PLT 169 171 155  CREATININE 2.04* 1.88* 1.92*    Estimated Creatinine Clearance: 41.5 mL/min (A) (by C-G formula based on SCr of 1.92 mg/dL (H)).   Medical History: Past Medical History:  Diagnosis Date  . Anxiety   . Aortic valve insufficiency   . Arthritis    "wee bit; knees, elbows" (10/15/2014)  . Atrial fibrillation ([place])   . Cardiomyopathy ([place])   . Chronic kidney disease    "down to ~ 1/2 of their regular use; I see kidney dr. in [place]" (10/15/2014)  . Complication of anesthesia    unsure of complications but pt. states there were complications  . COPD (chronic obstructive pulmonary disease) ([place])    on home o2  . Coronary artery disease   . Decreased testosterone level   . Depression   . Difficult intubation    difficult intubation 12/03/09 and 03/27/10 ([facility]); glidescope used 03/27/10  . DVT (deep venous thrombosis) ([place]) ~ 2013   "BLE"  . Dysrhythmia    A. Fib  . Emphysema of lung ([place])   . Enlarged prostate   . Falls frequently    > 20 times in the last year/notes 10/15/2014  . GERD (gastroesophageal reflux disease)   . Heart murmur   . History of blood transfusion 1960   "lots; related to an accident"  . History of echocardiogram    post AVR >> Echo 7/16:  Mild LVH, EF 20-25%, AVR ok (peak 15 mmHg, mean 9  mmHg), MAC, mild MR, severe LAE, mild reduced RVSF, mild RAE, PASP 35 mmHg  . Hypercholesteremia   . Hypertension   . Hypothyroidism   . OSA (obstructive sleep apnea)    "suppose to wear mask; I throw it off in my sleep" (10/15/2014)  . Plantar fasciitis   . Right fibular fracture 10/15/2014  . Seasonal allergies   . Shortness of breath dyspnea   . Syncope 10/15/2014  . Syncope and collapse "several times"  . Urine frequency   . Urine incontinence   . Varicose veins      Assessment: 72 y/oM with PMH of PAF, DVT (~ 2013) on apixaban PTA admitted with acute metabolic and toxic encephalopathy. Increased lethargy with hypercarbic respiratory failure overnight, placed on BiPAP and transferred to stepdown. PO meds held, apixaban transitioned to IV heparin. Pharmacy consulted to assist with dosing of heparin.    Last dose of Apixaban 2.26m PO was 9/4 at 1034.   CBC: Hgb decreased to 11.8, Pltc WNL  SCr up to 1.92  No bleeding issues currently reported  Goal of Therapy:  Heparin level 0.3-0.7 units/ml  aPTT 66-102 seconds Monitor platelets by anticoagulation protocol: Yes   Plan:   aPTT and heparin level now  Start heparin infusion at 1650 units/hr (no initial bolus due to recent apixaban use)  Monitor both  aPTT and heparin levels since recent Apixaban can falsely elevate heparin levels (initially will use aPTT for dose adjustments); once the two correlate, will monitor with heparin levels only  aPTT 8 hours after heparin initiation  Daily CBC, heparin level  Monitor closely for s/sx of bleeding     Lindell Spar, PharmD, BCPS Pager: [phone] 05/04/2018 11:12 AM

## 2018-05-04 NOTE — Progress Notes (Signed)
Patient currently off BiPAP. Available if needed.

## 2018-05-04 NOTE — Progress Notes (Signed)
Shift events: called to room earlier in shift because pt's mental status had declined and he was somewhat unresponsive. NP to bedside. S: per RN, he received Haldol about 45 mins prior to above event. O: Poor appearing elderly male in NAD. He wakes up and is able to tell me his name. Follows some simple commands. He does go back to sleep though. VSS A/P: 1. Decline in mental status compared to earlier in shift. NP reviewed consult note from PCCM done before change of shift and he was obtunded at that time. NP believes this is all due to haldol at this time.  Asked RN to reassess and let NP know how he is doing around 4am. At 4am, he was more awake and responsive. RN paged around 6am because pt was again fairly unresponsive. ABG done them. PCO2 is back to normal. O2 sat normal. PO2 a tad low. pH is fine. Get CXR this am. PCCM to follow. Unsure of etiology of his mental status at this time. He did not have stroke symptoms at last assessment. His electrolytes are fine. Hgb is fine. Perhaps PNA now? KJKG, NP [facility]

## 2018-05-04 NOTE — Progress Notes (Addendum)
Called by nursing staff.  Patient continuing to have discomfort particularly over the low abdomen.  Initially thought this was constipation, however on further evaluation he also has a history of BPH, and urine output has been low.  I asked nursing staff to evaluate with bladder scanner and an estimated almost 600 mL in the bladder.  I had a long discussion with the patient as well as his son who was at bedside.  The patient has stated clearly to his son who is in agreement that his comfort is his biggest priority, and he does not want pain.  He has voiced his understanding that he is likely near the end of his life.  He stated "if I am dying I do not want to have pain".  His son who is at bedside agrees with this sentiment.  I reassured the patient and his son that his comfort and dignity is a priority.  Based on his bladder scan I have asked nursing staff to place Foley catheter first.  In addition I will add PRN fentanyl.  If he chooses to forego further noninvasive positive pressure ventilation which is certainly his right to do so, we may be entering a transition to comfort care very soon  Simonne Martinet ACNP-BC [facility] Pulmonary/Critical Care Pager # [phone] OR # [phone] if no answer

## 2018-05-04 NOTE — Consult Note (Signed)
Consultation Note Date: 05/04/2018   Patient Name: [name] [name]  DOB: 07-11-1932  MRN: [id number]  Age / Sex: 84 y.o., male  PCP: [name] Dials, MD Referring Physician: Cristal Ford, DO  Reason for Consultation: Establishing goals of care  HPI/Patient Profile: 84 y.o. male   admitted on 04/26/2018   84 year old with hypertension hyperlipidemia, atrial fibrillation, aortic insufficiency status post repair, chronic kidney disease, DVT, COPD, OSA [noncompliant].  Admitted on 8/28 with altered mental status, encephalopathy. Found to have UTI as well as severe hypothyroidism with TSH of 157, low free T4.  Treated with IV Synthroid and 1 dose of T3,  1gm Solu-Medrol X 1.  Also noted to have AKI which improved with hydration. CT head on admission was unremarkable.  Treated with IV ceftriaxone was transitioned to oral Keflex for UTI.  9/4- PCCM called for increased lethargy with hypercarbic respiratory failure.  Of note ABG on admission on 8/30 was normal.  Patient placed on BiPAP and transferred to stepdown. 9/5: Received Haldol last night at approximately 10:30 PM.  He is once again obtunded however gas exchange has improved.  Excellent GOC discussions are ongoing between PCCM colleagues, PMT consult to continue these conversations has been requested.   Clinical Assessment and Goals of Care:  The patient is resting in bed. He is holding our chaplain's hand. He is reported to be the most awake/alert he has been, since the past few days.   I met with the patient's [name] who is at the bedside. I introduced myself and palliative care as follows: Palliative medicine is specialized medical care for people living with serious illness. It focuses on providing relief from the symptoms and stress of a serious illness. The goal is to improve quality of life for both the patient and the family.   We reviewed the  patient's current condition, his baseline, his overall health. Patient lives at home in [place], [place] with his wife. He does use a walker, occasionally also uses a cane. He at times, is also the primary caregiver for his wife, who is reported to have dementia. We discussed about the patient's tenuous resp status, his acute encephalopathy, myxedema.   We also discussed clinical course as well as wishes moving forward in regard toadvanced directives. Concepts specific to code status and rehospitalization re discussed. We discusseddifference between a aggressive medical intervention path and a palliative, comfort focused care path.Values and goals of care important to patient and family were attempted to be elicited.  Patient's son is thankful for the assistance he has received from my PCCM colleague [name] Griffon, NP. Son feels comfortable with the decisions he has made, for the patient, he believes these are the best possible decisions that the patient would've chosen for himself.   The patient is able to answer quite a few questions appropriately. Family was discussing about whether or not the patient's wife ought to come and visit with the patient here in the hospital.   See below.    NEXT OF KIN  Patient lives  at home with his wife. He had only one child, a [name] who is present at the bedside.    SUMMARY OF RECOMMENDATIONS    Agree with DNR. Patient's son is ok with short term intubation, as a last resort, if patient not able to support the work of his breathing. Ok to continue PRN BIPAP for now.    Agree with bowel regimen, enema. Patient states that he does have a history of constipation, straining at stools.   Agree with possible need for foley, patient also on IV Lasix for diuresis.  Monitor disease trajectory.  Son states that if the patient has some degree of stabilization/recovery he'd like to explore if SNF rehab attempt would be appropriate. If not, we will need to  explore hospice options.  Thank you for the consult.   Code Status/Advance Care Planning:  DNR    Symptom Management:    as above   Palliative Prophylaxis:   Delirium Protocol   Psycho-social/Spiritual:   Desire for further Chaplaincy support:yes  Additional Recommendations: Caregiving  Support/Resources  Prognosis:   Unable to determine Guarded  Discharge Planning: To Be Determined      Primary Diagnoses: Present on Admission: . Altered mental status . Acute renal failure (ARF) ([place]) . Acute lower UTI . Anemia   I have reviewed the medical record, interviewed the patient and family, and examined the patient. The following aspects are pertinent.  Past Medical History:  Diagnosis Date  . Anxiety   . Aortic valve insufficiency   . Arthritis    "wee bit; knees, elbows" (10/15/2014)  . Atrial fibrillation ([place])   . Cardiomyopathy ([place])   . Chronic kidney disease    "down to ~ 1/2 of their regular use; I see kidney dr. in [place]" (10/15/2014)  . Complication of anesthesia    unsure of complications but pt. states there were complications  . COPD (chronic obstructive pulmonary disease) ([place])    on home o2  . Coronary artery disease   . Decreased testosterone level   . Depression   . Difficult intubation    difficult intubation 12/03/09 and 03/27/10 ([facility]); glidescope used 03/27/10  . DVT (deep venous thrombosis) ([place]) ~ 2013   "BLE"  . Dysrhythmia    A. Fib  . Emphysema of lung ([place])   . Enlarged prostate   . Falls frequently    > 20 times in the last year/notes 10/15/2014  . GERD (gastroesophageal reflux disease)   . Heart murmur   . History of blood transfusion 1960   "lots; related to an accident"  . History of echocardiogram    post AVR >> Echo 7/16:  Mild LVH, EF 20-25%, AVR ok (peak 15 mmHg, mean 9 mmHg), MAC, mild MR, severe LAE, mild reduced RVSF, mild RAE, PASP 35 mmHg  . Hypercholesteremia   . Hypertension   . Hypothyroidism   . OSA  (obstructive sleep apnea)    "suppose to wear mask; I throw it off in my sleep" (10/15/2014)  . Plantar fasciitis   . Right fibular fracture 10/15/2014  . Seasonal allergies   . Shortness of breath dyspnea   . Syncope 10/15/2014  . Syncope and collapse "several times"  . Urine frequency   . Urine incontinence   . Varicose veins    Social History   Socioeconomic History  . Marital status: Married    Spouse name: Joycelyn Schmid  . Number of children: 1  . Years of education: Master's  . Highest education  level: Not on file  Occupational History  . Not on file  Social Needs  . Financial resource strain: Not on file  . Food insecurity:    Worry: Not on file    Inability: Not on file  . Transportation needs:    Medical: Not on file    Non-medical: Not on file  Tobacco Use  . Smoking status: Former Smoker    Packs/day: 2.00    Years: 22.00    Pack years: 44.00    Types: Cigarettes    Last attempt to quit: 04/29/1974    Years since quitting: 44.0  . Smokeless tobacco: Never Used  Substance and Sexual Activity  . Alcohol use: Yes    Alcohol/week: 0.0 standard drinks    Comment: 1 beer per year   . Drug use: No  . Sexual activity: Not Currently  Lifestyle  . Physical activity:    Days per week: Not on file    Minutes per session: Not on file  . Stress: Not on file  Relationships  . Social connections:    Talks on phone: Not on file    Gets together: Not on file    Attends religious service: Not on file    Active member of club or organization: Not on file    Attends meetings of clubs or organizations: Not on file    Relationship status: Not on file  Other Topics Concern  . Not on file  Social History Narrative   Lives at home with wife.   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   Right handed.   Caffeine use: 3 cups coffee per day.   Drinks 4 cups tea per day   Drinks soda  very rarely    Family History  Problem Relation Age of Onset  . Rheum arthritis Mother   . Diabetes Mother   . Heart disease Father   . Rheum arthritis Father   . Kidney disease Father   . Kidney failure Brother   . Breast cancer Sister   . Other Sister        Murdered  . Depression Sister   . Cancer Sister   . Stroke Sister   . Colon cancer Neg Hx   . Colon polyps Neg Hx   . Liver disease Neg Hx    Scheduled Meds: . chlorhexidine  15 mL Mouth Rinse BID  . furosemide  40 mg Intravenous Q12H  . ipratropium-albuterol  3 mL Nebulization TID  . levothyroxine  100 mcg Intravenous Daily  . mouth rinse  15 mL Mouth Rinse q12n4p  . tamsulosin  0.4 mg Oral QPC supper   Continuous Infusions: .  ceFAZolin (ANCEF) IV Stopped (05/04/18 1412)  . heparin 1,650 Units/hr (05/04/18 1217)   PRN Meds:.acetaminophen **OR** acetaminophen, albuterol, fentaNYL (SUBLIMAZE) injection, hydrALAZINE Medications Prior to Admission:  Prior to Admission medications   Medication Sig Start Date End Date Taking? Authorizing Provider  apixaban (ELIQUIS) 2.5 MG TABS tablet Take 1 tablet (2.5 mg total) by mouth 2 (two) times daily. 02/21/18  Yes Nahser, [name], MD  atorvastatin (LIPITOR) 10 MG tablet Take 1 tablet (10 mg total) by mouth daily. 02/28/15  Yes Angiulli, Lavon Paganini, PA-C  buPROPion (WELLBUTRIN XL) 300 MG 24 hr tablet Take 1 tablet (300 mg total) by mouth daily. 02/28/15  Yes Angiulli, Lavon Paganini, PA-C  busPIRone (BUSPAR) 15 MG tablet Take 1 tablet (  15 mg total) by mouth 2 (two) times daily. 02/28/15  Yes Angiulli, Lavon Paganini, PA-C  carvedilol (COREG) 25 MG tablet Take 25 mg by mouth 2 (two) times daily with a meal.   Yes [provider]  clonazePAM (KLONOPIN) 0.5 MG tablet Take 1 tablet (0.5 mg total) by mouth at bedtime. 02/28/15  Yes Angiulli, Lavon Paganini, PA-C  dutasteride (AVODART) 0.5 MG capsule Take 1 capsule (0.5 mg total) by mouth daily. 07/16/15  Yes Meredith Staggers, MD  escitalopram (LEXAPRO)  10 MG tablet Take 10 mg by mouth daily. 01/21/18  Yes [provider]  folic acid (FOLVITE) 1 MG tablet Take 1 tablet (1 mg total) by mouth daily. 08/13/15  Yes Meredith Staggers, MD  furosemide (LASIX) 20 MG tablet Take 1 tablet (20 mg total) by mouth daily as needed for fluid. Only if you gain > 3 lb in 24 hrs 01/29/18  Yes Rizwan, Eunice Blase, MD  ipratropium-albuterol (DUONEB) 0.5-2.5 (3) MG/3ML SOLN Take 3 mLs by nebulization every 4 (four) hours as needed (for wheezing associated with shortness of breath). 01/29/18  Yes Debbe Odea, MD  lamoTRIgine (LAMICTAL) 100 MG tablet Take 1 tablet (100 mg total) by mouth 2 (two) times daily. Breakfast and dinner Patient taking differently: Take 100-200 mg by mouth 2 (two) times daily. 200 MG for Breakfast and 100 MG for dinner 02/28/15  Yes Angiulli, Lavon Paganini, PA-C  levothyroxine (SYNTHROID, LEVOTHROID) 175 MCG tablet Take 175 mcg by mouth daily before breakfast.    Yes [provider]  mirtazapine (REMERON) 7.5 MG tablet Take 15 mg by mouth at bedtime.  10/13/15  Yes [provider]  omeprazole (PRILOSEC) 20 MG capsule Take 1 capsule (20 mg total) by mouth daily. 12/08/16  Yes Juanito Doom, MD  potassium chloride (K-DUR) 10 MEQ tablet TAKE 1 TABLET BY MOUTH EVERY DAY. KEEP UPCOMING APPOINTMENT IN JULY FOR FUTURE REFILLS. 04/07/18  Yes Nahser, [name], MD  Tiotropium Bromide-Olodaterol (STIOLTO RESPIMAT) 2.5-2.5 MCG/ACT AERS Inhale 2 puffs into the lungs daily. 02/23/17  Yes Juanito Doom, MD  calcium carbonate (TUMS - DOSED IN MG ELEMENTAL CALCIUM) 500 MG chewable tablet Chew 1 tablet (200 mg of elemental calcium total) by mouth 3 (three) times daily as needed for indigestion or heartburn. Patient not taking: Reported on 04/26/2018 01/29/18   Debbe Odea, MD  famotidine (PEPCID) 20 MG tablet Take 1 tablet (20 mg total) by mouth 2 (two) times daily as needed for heartburn or indigestion. Patient not taking: Reported on 04/26/2018 01/29/18  01/29/19  Debbe Odea, MD  guaiFENesin (MUCINEX) 600 MG 12 hr tablet Take 1 tablet (600 mg total) by mouth 2 (two) times daily. Patient not taking: Reported on 04/26/2018 01/29/18   Debbe Odea, MD  polyethylene glycol (MIRALAX / GLYCOLAX) packet Take 17 g by mouth daily as needed for mild constipation. Patient not taking: Reported on 04/26/2018 01/29/18   Debbe Odea, MD  senna (SENOKOT) 8.6 MG TABS tablet Take 1 tablet (8.6 mg total) by mouth 2 (two) times daily. Patient not taking: Reported on 03/17/2018 01/29/18   Debbe Odea, MD  tolnaftate (TINACTIN) 1 % powder Apply topically 2 (two) times daily. Patient not taking: Reported on 04/26/2018 01/29/18   Debbe Odea, MD   Allergies  Allergen Reactions  . Haldol [Haloperidol Lactate]     Over-sedation and at times agitation    Review of Systems +abd pain  Physical Exam Awake alert Patient appears weak, but he is able to answer  questions appropriately Shallow clear breath sounds Regular Abdomen is not distended No edema  Vital Signs: BP (!) 142/78   Pulse 64   Temp 99.1 F (37.3 C) (Axillary)   Resp (!) 21   Ht 6' (1.829 m)   Wt (!) 139.7 kg   SpO2 99%   BMI 41.77 kg/m  Pain Scale: CPOT   Pain Score: 0-No pain   SpO2: SpO2: 99 % O2 Device:SpO2: 99 % O2 Flow Rate: .O2 Flow Rate (L/min): 5 L/min  IO: Intake/output summary: No intake or output data in the 24 hours ending 05/04/18 1551  LBM: Last BM Date: 04/30/18 Baseline Weight: Weight: 136.1 kg Most recent weight: Weight: (!) 139.7 kg     Palliative Assessment/Data:   PPS 40%  Time In:  1400 Time Out:  1500 Time Total:  60 min  Greater than 50%  of this time was spent counseling and coordinating care related to the above assessment and plan.  Signed by: Loistine Chance, MD  [phone] Please contact Palliative Medicine Team phone at [phone] for questions and concerns.  For individual provider: See Shea Evans

## 2018-05-04 NOTE — Progress Notes (Signed)
PROGRESS NOTE    [name] [name]  BJY:[id number] DOB: Jul 05, 1932 DOA: 04/26/2018 PCP: Henrine Screws, MD   Brief Narrative:  HPI on 04/26/2018 by Dr. Pearson Grippe  [name] [name]  is a 84 y.o. male, w hypertension, yperlipidemia, Pafib, Aortic insufficiency, CKD stage 3, h/o DVT, Copd, OSA apparently c/o altered mental status.  Apparently left house around noon and ended up in [place], and his son did a missing person, Silver Alert.  Pt seemed more confused than normal.   Interim history Patient admitted for acute metabolic and toxic encephalopathy.  He was found to have UTI as well as severe hypothyroidism which have both been treated.  Continues to have AMS, and now resp failure with hypercapnia/hypoxia. Currently on BiPAP. PCCM consulted. So been evaluated by physical and occupational therapy that recommended SNF placement. Assessment & Plan   Acute metabolic and toxic encephalopathy -Suspect multifactorial including UTI and severe hypothyroidism -Patient presented with severe confusion and was actually found to driven to a different city.  A silver alert was called on the patient.  Initial diagnosis was acute kidney injury as well as UTI and polypharmacy.  Further work-up revealed severe hypothyroidism with a TSH of 157, rechecked to be 138 as well as a free T4 level of 0.2. -Patient was placed on aggressive hydration.  His renal function did improve. -He was placed on IV Synthroid, amount was increased to IV (on orally at home). -Patient was also given 1 dose of oral T3 on 04/30/18 ( cytomel) -also received solumedrol 1g -Hospitalist discussed with patient's family, patient has been becoming more confused lately which is been worsening over the past several months.  It seems that patient was not taking his Synthroid regularly. -CT head unremarkable x 2 -UA was positive for UTI and treated with IV ceftriaxone, transitioned to oral Keflex -Urine culture negative -ABG was checked  and showed no evidence of hypercarbia. (Of note, patient does not tolerate his CPAP nightly) -Discussed with endocrinology- recommended checking cortisol level in the morning. Feels mental status should have improved. May need additional T3 or steroids -Discussed with neurology, Dr. Amada Jupiter, recommended speaking with endocrinology and possibly MRI  -Discussed with radiology, it seems patient had several surgeries in the 1960s, including a clip-therefore cannot obtain MRI. -AM cortisol level pending  Acute hypoxic/hypercapnic respiratory failure -placed on BiPAP -PCCM consulted and appreciated  Cardiac/sinus pauses -Suspect secondary to hypothyroidism vs OSA (uncontrolled) -magnesium and potassium WNL -Cardiology consulted and appreciated  Severe hypothyroidism -Treatment and plan as noted above -Patient will need to establish care with endocrinologist for further monitoring as an outpatient  Anxiety/mood disorder -home medications: Wellbutrin, BuSpar, Lexapro, Remeron, Lamictal -Suspect his medications are too much for an 84 year old -Currently on Risperdal and Haldol as needed -doses of risperdal decreased  BPH -Continue flomax   COPD/OSA noncompliant with CPAP -Appears to be stable -continue supplemental O2 as needed- currently on BIPAP (see discussion above) -continue duonebs as needed  History of DVT/Paroxysmal Atrial fibrillation -s/p left atrial appendage device placement  -continue Eliquis  Goals of care -Had long and extensive conversation with patient's son and daughter in law at bedside. Discussed intubation and CPR. Patient's son feels overwhelmed and is open to further discussion with palliative care. -Palliative care consulted and appreciated.  DVT Prophylaxis  Eliquis  Code Status: Full  Family Communication: Family at bedside.  Disposition Plan: Admitted. Remain admitted in SDU.  Consultants Neurology, via phone, Dr. Amada Jupiter Endocrinology, via  phone, Dr. Carlus Pavlov PCCM Cardiology  Palliative care  Procedures  None  Antibiotics   Anti-infectives (From admission, onward)   Start     Dose/Rate Route Frequency Ordered Stop   04/29/18 1000  cephALEXin (KEFLEX) capsule 500 mg     500 mg Oral Every 12 hours 04/29/18 0841     04/27/18 0600  cefTRIAXone (ROCEPHIN) 1 g in sodium chloride 0.9 % 100 mL IVPB  Status:  Discontinued     1 g 200 mL/hr over 30 Minutes Intravenous Every 24 hours 04/26/18 0638 04/29/18 0840   04/26/18 0530  cefTRIAXone (ROCEPHIN) 1 g in sodium chloride 0.9 % 100 mL IVPB     1 g 200 mL/hr over 30 Minutes Intravenous  Once 04/26/18 0522 04/26/18 4098      Subjective:   [name] [name] seen and examined today.  Not interactive today, currently appears to be obtunded. Son at bedside.   Objective:   Vitals:   05/04/18 0500 05/04/18 0600 05/04/18 0800 05/04/18 0823  BP: (!) 77/41 114/84    Pulse:    64  Resp: 19 13  10   Temp:   97.7 F (36.5 C)   TempSrc:   Axillary   SpO2: 98% 99%  98%  Weight:      Height:        Intake/Output Summary (Last 24 hours) at 05/04/2018 0925 Last data filed at 05/03/2018 1236 Gross per 24 hour  Intake -  Output 100 ml  Net -100 ml   Filed Weights   05/02/18 0500 05/03/18 0500 05/03/18 1711  Weight: (!) 140.5 kg (!) 138.7 kg (!) 139.7 kg   Exam  General: Well developed, ill appearing, NAD  HEENT: NCAT, BiPAP in place  Neck: Supple  Cardiovascular: S1 S2 auscultated, irregular  Respiratory: Diminished breath sounds anteriorly, no wheezing  Abdomen: Soft, obese, nontender, nondistended, + bowel sounds  Extremities: warm dry without cyanosis clubbing or edema  Neuro: obtunded   Data Reviewed: I have personally reviewed following labs and imaging studies  CBC: Recent Labs  Lab 04/30/18 0458 05/01/18 0425 05/02/18 0432 05/03/18 0428 05/04/18 0333  WBC 7.5 5.2 5.3 6.1 6.5  HGB 12.0* 11.9* 11.6* 12.8* 11.8*  HCT 37.5* 37.5* 36.9* 41.0 36.9*    MCV 98.4 98.9 98.7 100.0 97.4  PLT 193 171 169 171 155   Basic Metabolic Panel: Recent Labs  Lab 04/29/18 0348 04/30/18 0458 05/01/18 0425 05/02/18 0432 05/03/18 0428 05/04/18 0333  NA 138 138 137 138 138 145  K 4.2 4.4 3.9 4.1 4.2 4.5  CL 97* 97* 99 98 97* 102  CO2 30 29 28  32 32 27  GLUCOSE 113* 109* 96 105* 114* 86  BUN 18 20 20 21 18 21   CREATININE 2.06* 2.11* 1.86* 2.04* 1.88* 1.92*  CALCIUM 8.4* 8.6* 8.3* 8.4* 8.8* 8.8*  MG  --   --   --   --  2.3  --   PHOS 3.2 3.5 3.9 3.6 3.8  --    GFR: Estimated Creatinine Clearance: 41.5 mL/min (A) (by C-G formula based on SCr of 1.92 mg/dL (H)). Liver Function Tests: Recent Labs  Lab 04/30/18 0458 05/01/18 0425 05/02/18 0432 05/03/18 0428 05/04/18 0333  AST  --   --   --   --  26  ALT  --   --   --   --  18  ALKPHOS  --   --   --   --  65  BILITOT  --   --   --   --  0.8  PROT  --   --   --   --  6.3*  ALBUMIN 3.6 3.3* 3.3* 3.7 3.4*   No results for input(s): LIPASE, AMYLASE in the last 168 hours. No results for input(s): AMMONIA in the last 168 hours. Coagulation Profile: No results for input(s): INR, PROTIME in the last 168 hours. Cardiac Enzymes: No results for input(s): CKTOTAL, CKMB, CKMBINDEX, TROPONINI in the last 168 hours. BNP (last 3 results) No results for input(s): PROBNP in the last 8760 hours. HbA1C: No results for input(s): HGBA1C in the last 72 hours. CBG: No results for input(s): GLUCAP in the last 168 hours. Lipid Profile: No results for input(s): CHOL, HDL, LDLCALC, TRIG, CHOLHDL, LDLDIRECT in the last 72 hours. Thyroid Function Tests: No results for input(s): TSH, T4TOTAL, FREET4, T3FREE, THYROIDAB in the last 72 hours. Anemia Panel: No results for input(s): VITAMINB12, FOLATE, FERRITIN, TIBC, IRON, RETICCTPCT in the last 72 hours. Urine analysis:    Component Value Date/Time   COLORURINE AMBER (A) 04/26/2018 0442   APPEARANCEUR HAZY (A) 04/26/2018 0442   LABSPEC 1.033 (H) 04/26/2018  0442   PHURINE 5.0 04/26/2018 0442   GLUCOSEU NEGATIVE 04/26/2018 0442   HGBUR NEGATIVE 04/26/2018 0442   BILIRUBINUR MODERATE (A) 04/26/2018 0442   KETONESUR 5 (A) 04/26/2018 0442   PROTEINUR 30 (A) 04/26/2018 0442   UROBILINOGEN 2.0 (H) 02/21/2015 1241   NITRITE NEGATIVE 04/26/2018 0442   LEUKOCYTESUR TRACE (A) 04/26/2018 0442   Sepsis Labs: @LABRCNTIP (procalcitonin:4,lacticidven:4)  ) Recent Results (from the past 240 hour(s))  Blood culture (routine x 2)     Status: None   Collection Time: 04/26/18  4:43 AM  Result Value Ref Range Status   Specimen Description   Final    BLOOD LEFT ANTECUBITAL Performed at [facility], [address] [place], [place] [address]    Special Requests   Final    BOTTLES DRAWN AEROBIC AND ANAEROBIC Blood Culture adequate volume Performed at [facility] [address], [place], [place] [address]    Culture   Final    NO GROWTH 5 DAYS Performed at [facility], [address], [place], [place] [address]    Report Status 05/01/2018 FINAL  Final  Urine culture     Status: None   Collection Time: 04/26/18  5:03 AM  Result Value Ref Range Status   Specimen Description   Final    URINE, RANDOM Performed at [facility], [address], [place], [place] [address]    Special Requests   Final    NONE Performed at [facility], [address], [place], [place] [address]    Culture   Final    NO GROWTH Performed at [facility], [address], [place], [place] [address]    Report Status 04/27/2018 FINAL  Final  Blood culture (routine x 2)     Status: None   Collection Time: 04/26/18  7:46 AM  Result Value Ref Range Status   Specimen Description   Final    BLOOD RIGHT ANTECUBITAL Performed at [facility], [address], [place], [place] [address]    Special Requests   Final    BOTTLES DRAWN AEROBIC AND ANAEROBIC Blood Culture  results may not be optimal due to an inadequate volume of blood received in culture bottles Performed at [facility], [address] [place], [place] [address]    Culture   Final  NO GROWTH 5 DAYS Performed at [facility], [address] [place], [place] [address]    Report Status 05/01/2018 FINAL  Final  MRSA PCR Screening     Status: None   Collection Time: 05/04/18  3:08 AM  Result Value Ref Range Status   MRSA by PCR NEGATIVE NEGATIVE Final    Comment:        The GeneXpert MRSA Assay (FDA approved for NASAL specimens only), is one component of a comprehensive MRSA colonization surveillance program. It is not intended to diagnose MRSA infection nor to guide or monitor treatment for MRSA infections. Performed at [facility], [address], [place], [place] [address]       Radiology Studies: Dg Chest Port 1 View  Result Date: 05/03/2018 CLINICAL DATA:  Hypertension atrial fibrillation EXAM: PORTABLE CHEST 1 VIEW COMPARISON:  04/27/2018, 04/26/2018, 01/27/2018 FINDINGS: Post sternotomy changes with valve replacement. Enlarged cardiomediastinal silhouette, gad rated by rotation. Mild central vascular congestion. Probable pleural effusions. Bibasilar airspace disease. No pneumothorax. IMPRESSION: 1. Cardiomegaly with vascular congestion 2. Suspected pleural effusions. Hazy bibasilar airspace disease which may reflect atelectasis or pneumonia. Electronically Signed   By: Jasmine Pang M.D.   On: 05/03/2018 19:45     Scheduled Meds: . apixaban  2.5 mg Oral BID  . cephALEXin  500 mg Oral Q12H  . chlorhexidine  15 mL Mouth Rinse BID  . ipratropium-albuterol  3 mL Nebulization TID  . levothyroxine  100 mcg Intravenous Daily  . mouth rinse  15 mL Mouth Rinse q12n4p  . risperiDONE  0.25 mg Oral Daily  . risperiDONE  0.5 mg Oral QHS  . tamsulosin  0.4 mg Oral QPC supper   Continuous Infusions:   LOS: 7 days   Time Spent in  minutes   45 minutes (greater than 50% of time spent with patient face to face, as well as reviewing old records, calling consults, and formulating a plan)  Miela Desjardin D.O. on 05/04/2018 at 9:25 AM  Between 7am to 7pm - Please see pager noted on [email or web address]  After 7pm go to [email or web address]  And look for the night coverage person covering for me after hours  [facility] Office  [phone]

## 2018-05-04 NOTE — Progress Notes (Addendum)
Polk Phong  WUJ:[id number] DOB: 1932/03/28 DOA: 04/26/2018 PCP: Henrine Screws, MD    LOS: 7 days   Reason for Consult / Chief Complaint:  Hypercarbic respiratory failure, altered mental status  Consulting MD and date:  05/03/2018- Dr Nunzio Cory MD  HPI/Summary of hospital stay:  84 year old with hypertension hyperlipidemia, atrial fibrillation, aortic insufficiency status post repair, chronic kidney disease, DVT, COPD, OSA [noncompliant].  Admitted on 8/28 with altered mental status, encephalopathy. Found to have UTI as well as severe hypothyroidism with TSH of 157, low free T4.  Treated with IV Synthroid and 1 dose of T3,  1gm Solu-Medrol X 1.  Also noted to have AKI which improved with hydration. CT head on admission was unremarkable.  Treated with IV ceftriaxone was transitioned to oral Keflex for UTI.  9/4- PCCM called for increased lethargy with hypercarbic respiratory failure.  Of note ABG on admission on 8/30 was normal.  Patient placed on BiPAP and transferred to stepdown. 9/5: Received Haldol last night at approximately 10:30 PM.  He is once again obtunded however gas exchange has improved.  Subjective:  Denies pain, but only wakes up for brief periods of time  Objective   Blood pressure 114/84, pulse 64, temperature 97.7 F (36.5 C), temperature source Axillary, resp. rate 10, height 6' (1.829 m), weight (Abnormal) 139.7 kg, SpO2 98 %.    FiO2 (%):  [30 %] 30 %   Intake/Output Summary (Last 24 hours) at 05/04/2018 9562 Last data filed at 05/03/2018 1236 Gross per 24 hour  Intake no documentation  Output 100 ml  Net -100 ml   Filed Weights   05/02/18 0500 05/03/18 0500 05/03/18 1711  Weight: (Abnormal) 140.5 kg (Abnormal) 138.7 kg (Abnormal) 139.7 kg    Examination:  General: 84 year old obese male currently lethargic, and slow to respond on BiPAP therapy HEENT: Neck is large, mucous membranes moist.  BiPAP mask in place.  No air leak appreciated. Pulmonary:  Decreased throughout, equal chest rise, no accessory use. Cardiac: Regular rate and rhythm bradycardic. Abdomen: Obese, positive bowel sounds.  No organomegaly. Extremities: Lower extremity edema, chronic venous stasis changes, warm, dry, strong pulses. Neuro: Opens eyes, will verbalize one word phrases.  Follows commands briskly. GU: Clear yellow Consults: date of consult/date signed off & final recs:  Pulmonary consulted 9/4 for [facility]   Procedures:   Significant Diagnostic Tests: CT head 8/28- age-related atrophy, chronic microvascular ischemic changes, left ICA surgical clips CT head 8/28-stable findings of the brain, acute on chronic sinusitis.  Chest x-ray 04/27/2018- sternotomy, cardiac valve replacement, atrial clip, cardiomegaly with mild vascular prominence.  Bibasilar atelectasis, infiltrates.  I reviewed the images personally.  Micro Data: Blood culture 8/28-negative Urine culture 8/28- no growth  Antimicrobials:  Ceftriaxone 8/28-8/31 Keflex 8/31> 9/5 Ancef 9/5>>>  Resolved Hospital Problem list     Assessment & Plan:   Acute metabolic versus toxic encephalopathy superimposed on history of anxiety, mood disorder -He had perked up, actually got agitated and received Haldol once again on the evening of 9/4.  He is once again heavily sedated/obtunded  plan Holding all sedating medications  Acute Hypercarbic respiratory failure COPD, untreated OSA -etiology unclear. ? 2/2 haldol superimposed on OSA? ? What component hypothyroidism is playing.  -May be a difficult airway. As per son he had a difficult intubation during surgery 10 yrs ago. No more details are available. -Portable chest x-ray personally reviewed rotated film.  Cardiomegaly.  Bibasilar volume loss with element of effusion and atelectasis.  Also suspect element of pulmonary edema -Serial arterial blood gases improved with noninvasive positive pressure ventilation Plan Continue noninvasive positive  pressure ventilation, try to transition to as needed mandatory at bedtime Continue scheduled bronchodilators IV Lasix Hold any and all sedating medications He would be full DO NOT RESUSCITATE should he suffer cardiopulmonary arrest. His son is deciding on brief intubation, however is leaning against this  Severe hypothyroidism Plan Continue Synthroid Follow TSH Follow-up cortisol   History of DVT, paroxysmal A. fib Plan  Lasix as above  IV heparin   CKD stage III -Baseline 0.6-1.7 Plan Trend chemistry Renal dose medications   Disposition / Summary of Today's Plan 05/04/18   Acute toxic and metabolic encephalopathy.  Suspect acutely this is due to adverse reaction to Haldol superimposed however on his underlying hypothyroidism, chronic renal insufficiency, and simply hospital associated delirium.  He is severely deconditioned.  Has very strong feelings about nursing home placement following acute illness.  We had a long discussion about goals of care (son and I), based on our discussion we will make him a DO NOT RESUSCITATE should he suffer cardiac or respiratory arrest.  However short-term ventilation still to be determined however I suspect this is a red line we will not cross as well.  My hope is that he will continue to wake up over the next few hours as the Haldol wears off, however this hypothetically could last for a couple days looking at half-life alone.  For now we will continue supportive care including BiPAP, diuresis, and holding sedating medications. Change meds to IV   Best Practice / Goals of Care / Disposition.   DVT prophylaxis: Eliquis--> IV heparin  GI prophylaxis: NA Diet: NPO Mobility:Bed Code Status: DNR Family Communication: Son updated at bedside.  Son deciding on short term intubation still. Seems to be leaning away from this   Labs   CBC: Recent Labs  Lab 04/30/18 0458 05/01/18 0425 05/02/18 0432 05/03/18 0428 05/04/18 0333  WBC 7.5 5.2 5.3 6.1  6.5  HGB 12.0* 11.9* 11.6* 12.8* 11.8*  HCT 37.5* 37.5* 36.9* 41.0 36.9*  MCV 98.4 98.9 98.7 100.0 97.4  PLT 193 171 169 171 155   Basic Metabolic Panel: Recent Labs  Lab 04/29/18 0348 04/30/18 0458 05/01/18 0425 05/02/18 0432 05/03/18 0428 05/04/18 0333  NA 138 138 137 138 138 145  K 4.2 4.4 3.9 4.1 4.2 4.5  CL 97* 97* 99 98 97* 102  CO2 30 29 28  32 32 27  GLUCOSE 113* 109* 96 105* 114* 86  BUN 18 20 20 21 18 21   CREATININE 2.06* 2.11* 1.86* 2.04* 1.88* 1.92*  CALCIUM 8.4* 8.6* 8.3* 8.4* 8.8* 8.8*  MG  --   --   --   --  2.3  --   PHOS 3.2 3.5 3.9 3.6 3.8  --    GFR: Estimated Creatinine Clearance: 41.5 mL/min (A) (by C-G formula based on SCr of 1.92 mg/dL (H)). Recent Labs  Lab 05/01/18 0425 05/02/18 0432 05/03/18 0428 05/04/18 0333  PROCALCITON  --   --   --  <0.10  WBC 5.2 5.3 6.1 6.5   Liver Function Tests: Recent Labs  Lab 04/30/18 0458 05/01/18 0425 05/02/18 0432 05/03/18 0428 05/04/18 0333  AST  --   --   --   --  26  ALT  --   --   --   --  18  ALKPHOS  --   --   --   --  65  BILITOT  --   --   --   --  0.8  PROT  --   --   --   --  6.3*  ALBUMIN 3.6 3.3* 3.3* 3.7 3.4*   No results for input(s): LIPASE, AMYLASE in the last 168 hours. No results for input(s): AMMONIA in the last 168 hours. ABG    Component Value Date/Time   PHART 7.441 05/04/2018 0610   PCO2ART 43.2 05/04/2018 0610   PO2ART 76.5 (L) 05/04/2018 0610   HCO3 29.1 (H) 05/04/2018 0610   TCO2 21 02/12/2015 1639   ACIDBASEDEF 2.0 02/12/2015 0334   O2SAT 96.1 05/04/2018 0610    Coagulation Profile: No results for input(s): INR, PROTIME in the last 168 hours. Cardiac Enzymes: No results for input(s): CKTOTAL, CKMB, CKMBINDEX, TROPONINI in the last 168 hours. HbA1C: Hgb A1c MFr Bld  Date/Time Value Ref Range Status  01/28/2018 03:48 AM 5.7 (H) 4.8 - 5.6 % Final    Comment:    (NOTE) Pre diabetes:          5.7%-6.4% Diabetes:              >6.4% Glycemic control for    <7.0% adults with diabetes   04/08/2015 02:28 PM 5.5 4.8 - 5.6 % Final    Comment:             Pre-diabetes: 5.7 - 6.4          Diabetes: >6.4          Glycemic control for adults with diabetes: <7.0    CBG: No results for input(s): GLUCAP in the last 168 hours.     Simonne Martinet ACNP-BC [facility] Pulmonary/Critical Care Pager # [phone] OR # [phone] if no answer

## 2018-05-04 NOTE — Progress Notes (Signed)
PT Cancellation Note  Patient Details Name: [name] [name] MRN: [id number] DOB: 04-04-1932   Cancelled Treatment:    Reason Eval/Treat Not Completed: Medical issues which prohibited therapy, transfer to SDU .   Rada Hay 05/04/2018, 7:20 AM Blanchard Kelch PT [phone]

## 2018-05-04 NOTE — Progress Notes (Addendum)
Pharmacy Antibiotic Note  [name] [name] is a 84 y.o. male admitted on 04/26/2018 with UTI.  Pharmacy has been consulted for cefazolin dosing. Patient previously on ceftriaxone then transitioned to cephalexin.  Pharmacy now asked to dose cefazolin.    Today, 05/04/2018 Day #9 antibiotic therapy  Afebrile  WBC WNL  Cultures unrevealing  procalcitonin <0.1  Discussed length of therapy with TR[facility],  due to acute issues continue abx at this time  Plan:  Cefazolin 1gm IV q8h - follow renal function  Follow length of therapy  Height: 6' (182.9 cm) Weight: (!) 307 lb 15.7 oz (139.7 kg) IBW/kg (Calculated) : 77.6  Temp (24hrs), Avg:97.8 F (36.6 C), Min:97.5 F (36.4 C), Max:98 F (36.7 C)  Recent Labs  Lab 04/30/18 0458 05/01/18 0425 05/02/18 0432 05/03/18 0428 05/04/18 0333  WBC 7.5 5.2 5.3 6.1 6.5  CREATININE 2.11* 1.86* 2.04* 1.88* 1.92*    Estimated Creatinine Clearance: 41.5 mL/min (A) (by C-G formula based on SCr of 1.92 mg/dL (H)).    Allergies  Allergen Reactions  . Haldol [Haloperidol Lactate]     Over-sedation and at times agitation     Antimicrobials this admission: 8/28 ceftriaxone >> 8/31 8/31 cephalexin >> 9/5 9/5 cefazolin >>  Dose adjustments this admission:  Microbiology results: 8/28 BCx: NG 8/28 UCx: NG    Thank you for allowing pharmacy to be a part of this patient's care.  Juliette Alcide, PharmD, BCPS.   Work Cell: [phone] 05/04/2018 12:10 PM

## 2018-05-04 NOTE — Progress Notes (Signed)
Chaplain following due to pt and family request.    Provided emotional and spiritual support at bedside with pt.  Son, daughter in Social worker and granddaughter present in room   During pastoral conversation, Tuan "Chrysler" engaged in life review with chaplain.  Identified feeling "peace."  Stated he used to be fearful and "a bad person," but during his life he found places of forgiveness for himself.  Described working with a counselor to find peace and rest in his life.  While chaplain was present, Chrysler was able to speak appreciation for his son and stated "if I don't wake up, I'm ok"  Family voiced affirmation and reminder that his journey is not finished yet.    Spouse does not know the extent of Chrysler's illness. He spoke with family about what he would value in his spouse visiting hospital.    Chaplain shared prayers with Chrysler, in which he longed to pray for his family.   Spiritual Care will continue to follow for support during admission.  Pt and family are aware of 24 hour chaplain availability.  Please page as needs arise.     [facility] / [facility] Chaplain Burnis Kingfisher, MDiv, [facility]

## 2018-05-05 ENCOUNTER — Inpatient Hospital Stay (HOSPITAL_COMMUNITY): Payer: Medicare Other

## 2018-05-05 LAB — HEPARIN LEVEL (UNFRACTIONATED): Heparin Unfractionated: >2.2 IU/mL — ABNORMAL HIGH (ref 0.30–0.70)

## 2018-05-05 LAB — CBC
HEMATOCRIT: 37.6 % — AB (ref 39.0–52.0)
Hemoglobin: 12.1 g/dL — ABNORMAL LOW (ref 13.0–17.0)
MCH: 31 pg (ref 26.0–34.0)
MCHC: 32.2 g/dL (ref 30.0–36.0)
MCV: 96.4 fL (ref 78.0–100.0)
Platelets: 178 10*3/uL (ref 150–400)
RBC: 3.9 MIL/uL — ABNORMAL LOW (ref 4.22–5.81)
RDW: 15.6 % — AB (ref 11.5–15.5)
WBC: 5.9 10*3/uL (ref 4.0–10.5)

## 2018-05-05 LAB — APTT
aPTT: 187 seconds (ref 24–36)
aPTT: >200 seconds (ref 24–36)
aPTT: 105 seconds — ABNORMAL HIGH (ref 24–36)

## 2018-05-05 LAB — BASIC METABOLIC PANEL
Anion gap: 15 (ref 5–15)
BUN: 26 mg/dL — AB (ref 8–23)
CHLORIDE: 96 mmol/L — AB (ref 98–111)
CO2: 29 mmol/L (ref 22–32)
Calcium: 8.8 mg/dL — ABNORMAL LOW (ref 8.9–10.3)
Creatinine, Ser: 2.31 mg/dL — ABNORMAL HIGH (ref 0.61–1.24)
GFR calc Af Amer: 28 mL/min — ABNORMAL LOW (ref >60)
GFR calc non Af Amer: 24 mL/min — ABNORMAL LOW (ref >60)
GLUCOSE: 89 mg/dL (ref 70–99)
Potassium: 3.7 mmol/L (ref 3.5–5.1)
SODIUM: 140 mmol/L (ref 135–145)

## 2018-05-05 LAB — PROCALCITONIN: Procalcitonin: <0.1 ng/mL

## 2018-05-05 MED ORDER — FUROSEMIDE 10 MG/ML IJ SOLN
40.0000 mg | Freq: Every day | INTRAMUSCULAR | Status: DC
  Filled 2018-05-05: qty 4

## 2018-05-05 MED ORDER — HEPARIN (PORCINE) IN NACL 100-0.45 UNIT/ML-% IJ SOLN
1000.0000 [IU]/h | INTRAMUSCULAR | Status: DC
  Administered 2018-05-05: 1000 [IU]/h via INTRAVENOUS

## 2018-05-05 MED ORDER — HEPARIN (PORCINE) IN NACL 100-0.45 UNIT/ML-% IJ SOLN
900.0000 [IU]/h | INTRAMUSCULAR | Status: DC
  Administered 2018-05-06: 900 [IU]/h via INTRAVENOUS
  Filled 2018-05-05: qty 250

## 2018-05-05 MED ORDER — HEPARIN (PORCINE) IN NACL 100-0.45 UNIT/ML-% IJ SOLN
1400.0000 [IU]/h | INTRAMUSCULAR | Status: DC
  Administered 2018-05-05: 1400 [IU]/h via INTRAVENOUS
  Filled 2018-05-05: qty 250

## 2018-05-05 NOTE — Progress Notes (Addendum)
[name] [name]  ZOX:[id number] DOB: [birth year] DOA: 04/26/2018 PCP: Henrine Screws, MD    LOS: 8 days   Reason for Consult / Chief Complaint:  Hypercarbic respiratory failure, altered mental status  Consulting MD and date:  05/03/2018- Dr Nunzio Cory MD  HPI/Summary of hospital stay:  84 year old with hypertension hyperlipidemia, atrial fibrillation, aortic insufficiency status post repair, chronic kidney disease, DVT, COPD, OSA [noncompliant].  Admitted on 8/28 with altered mental status, encephalopathy. Found to have UTI as well as severe hypothyroidism with TSH of 157, low free T4.  Treated with IV Synthroid and 1 dose of T3,  1gm Solu-Medrol X 1.  Also noted to have AKI which improved with hydration. CT head on admission was unremarkable.  Treated with IV ceftriaxone was transitioned to oral Keflex for UTI.  9/4- PCCM called for increased lethargy with hypercarbic respiratory failure.  Of note ABG on admission on 8/30 was normal.  Patient placed on BiPAP and transferred to stepdown. 9/5: Received Haldol last night at approximately 10:30 PM.  He is once again obtunded however gas exchange has improved. 9/6: late afternoon having abd pain, wanting palliative care. Found to have sig urinary retention. Placed foley cath which resolved pain. Subsequently mental status improved. Quiet evening. AM rounds night and day. Very alert, oriented   Subjective:  He denies pain this morning. Denies shortness of breath  Objective   Blood pressure 109/72, pulse 70, temperature 97.6 F (36.4 C), temperature source Axillary, resp. rate 13, height 6' (1.829 m), weight (Abnormal) 139.1 kg, SpO2 97 %.    FiO2 (%):  [30 %] 30 %   Intake/Output Summary (Last 24 hours) at 05/05/2018 0816 Last data filed at 05/05/2018 0700 Gross per 24 hour  Intake 406.83 ml  Output 2550 ml  Net -2143.17 ml   Filed Weights   05/03/18 0500 05/03/18 1711 05/05/18 0355  Weight: (Abnormal) 138.7 kg (Abnormal) 139.7 kg (Abnormal)  139.1 kg    Examination:  General: Chronically ill-appearing 84 year old white male currently resting in bed and he is in no acute distress. HEENT: Normocephalic atraumatic.  Mucous membranes are moist no clear jugular venous distention Pulmonary: Diminished bases no accessory use Cardiac: Regular irregular atrial fibrillation noted on telemetry Abdomen: Obese, soft, no organomegaly positive bowel sounds GU: Clear yellow via Foley catheter Extremities: Dependent edema, brisk capillary refill chronic venous stasis changes strong pulses Neuro: Awake, oriented, follows commands. Consults: date of consult/date signed off & final recs:  Pulmonary consulted 9/4 for [facility]  Palliative care consulted on 9/5: This was to continue ongoing goals of care. Procedures:   Significant Diagnostic Tests: CT head 8/28- age-related atrophy, chronic microvascular ischemic changes, left ICA surgical clips CT head 8/28-stable findings of the brain, acute on chronic sinusitis.  Chest x-ray 04/27/2018- sternotomy, cardiac valve replacement, atrial clip, cardiomegaly with mild vascular prominence.  Bibasilar atelectasis, infiltrates.  I reviewed the images personally.  Micro Data: Blood culture 8/28-negative Urine culture 8/28- no growth  Antimicrobials:  Ceftriaxone 8/28-8/31 Keflex 8/31> 9/5 Ancef 9/5>>>  Resolved Hospital Problem list     Assessment & Plan:   Acute metabolic versus toxic encephalopathy superimposed on history of anxiety, mood disorder -This was multifactorial.  Suspect due to mix of hypothyroidism, recent infection, sleep disturbance, polypharmacy, pain, and perhaps even urinary retention.  We have identified he seems to have a significant sensitivity to Haldol and have placed this on his allergy list.  As of the a.m. 9/6 he is awake appropriate, family tells me  the best he has looked plan Continue to hold any and all sedating medications Mobilize Have encouraged family to continue  reorientation interventions  Acute Hypercarbic respiratory failure COPD, untreated OSA -etiology unclear. ? 2/2 haldol superimposed on OSA? ? What component hypothyroidism is playing.  -May be a difficult airway. As per son he had a difficult intubation during surgery 10 yrs ago. No more details are available. Plan Would continue to offer noninvasive positive pressure ventilation at at bedtime.  I am not certain he will accept this as a long-term intervention Mobilize Continue to assess for appropriateness of diuresis Wean oxygen Avoid sedating medications  Severe hypothyroidism Plan Continue Synthroid Follow-up TSH   History of DVT, paroxysmal A. fib Plan  IV heparin for now, if his mental status improves can transition back to extubation To new telemetry monitoring  CKD stage III -Baseline 0.6-1.7 -His creatinine is increased to 2.31 from 1.92.  Not clear to me if this is the result of diuresis or was it simply from the urinary retention yesterday Plan Trend intake output Daily assessment for diuresis We will decrease the furosemide to daily for now  Urinary retention Plan Foley catheter placed   Urinary tract infection Plan Today is day #10 antibiotics, I think we can discontinue these today  Disposition / Summary of Today's Plan 05/05/18   He looks remarkably better.  I think he has a very low threshold for acute delirium with his underlying hypothyroidism likely making him more easily predisposed to this.  Yesterday we identified some significant urinary retention and seemingly since resolving this things look as though they may have turned the corner.  I am cautiously optimistic.  For today the goal will be to mobilize him.  We will decrease diuresis to daily.  Try to advance diet.  If no issues overnight can change IV heparin back to Eliquis.  The bigger issue will be disposition from here, he is quite debilitated after his prolonged illness and I am worried about  long-term goals.  For now we will continue to use at bedtime BiPAP.  But I doubt he will agree to this in the long-term, and that is okay as long as he is making informed decisions.  Critical care will sign off.  Please call if we can be of further assistance    Best Practice / Goals of Care / Disposition.   DVT prophylaxis: Eliquis--> IV heparin  GI prophylaxis: NA Diet: NPO Mobility:Bed Code Status: DNR Family Communication: Son updated at bedside.  Son deciding on short term intubation still. Seems to be leaning away from this   Labs   CBC: Recent Labs  Lab 05/01/18 0425 05/02/18 0432 05/03/18 0428 05/04/18 0333 05/05/18 0311  WBC 5.2 5.3 6.1 6.5 5.9  HGB 11.9* 11.6* 12.8* 11.8* 12.1*  HCT 37.5* 36.9* 41.0 36.9* 37.6*  MCV 98.9 98.7 100.0 97.4 96.4  PLT 171 169 171 155 178   Basic Metabolic Panel: Recent Labs  Lab 04/29/18 0348 04/30/18 0458 05/01/18 0425 05/02/18 0432 05/03/18 0428 05/04/18 0333 05/05/18 0311  NA 138 138 137 138 138 145 140  K 4.2 4.4 3.9 4.1 4.2 4.5 3.7  CL 97* 97* 99 98 97* 102 96*  CO2 30 29 28  32 32 27 29  GLUCOSE 113* 109* 96 105* 114* 86 89  BUN 18 20 20 21 18 21  26*  CREATININE 2.06* 2.11* 1.86* 2.04* 1.88* 1.92* 2.31*  CALCIUM 8.4* 8.6* 8.3* 8.4* 8.8* 8.8* 8.8*  MG  --   --   --   --  2.3  --   --   PHOS 3.2 3.5 3.9 3.6 3.8  --   --    GFR: Estimated Creatinine Clearance: 34.4 mL/min (A) (by C-G formula based on SCr of 2.31 mg/dL (H)). Recent Labs  Lab 05/02/18 0432 05/03/18 0428 05/04/18 0333 05/05/18 0311  PROCALCITON  --   --  <0.10 <0.10  WBC 5.3 6.1 6.5 5.9   Liver Function Tests: Recent Labs  Lab 04/30/18 0458 05/01/18 0425 05/02/18 0432 05/03/18 0428 05/04/18 0333  AST  --   --   --   --  26  ALT  --   --   --   --  18  ALKPHOS  --   --   --   --  65  BILITOT  --   --   --   --  0.8  PROT  --   --   --   --  6.3*  ALBUMIN 3.6 3.3* 3.3* 3.7 3.4*   No results for input(s): LIPASE, AMYLASE in the last 168  hours. No results for input(s): AMMONIA in the last 168 hours. ABG    Component Value Date/Time   PHART 7.441 05/04/2018 0610   PCO2ART 43.2 05/04/2018 0610   PO2ART 76.5 (L) 05/04/2018 0610   HCO3 29.1 (H) 05/04/2018 0610   TCO2 21 02/12/2015 1639   ACIDBASEDEF 2.0 02/12/2015 0334   O2SAT 96.1 05/04/2018 0610    Coagulation Profile: No results for input(s): INR, PROTIME in the last 168 hours. Cardiac Enzymes: No results for input(s): CKTOTAL, CKMB, CKMBINDEX, TROPONINI in the last 168 hours. HbA1C: Hgb A1c MFr Bld  Date/Time Value Ref Range Status  01/28/2018 03:48 AM 5.7 (H) 4.8 - 5.6 % Final    Comment:    (NOTE) Pre diabetes:          5.7%-6.4% Diabetes:              >6.4% Glycemic control for   <7.0% adults with diabetes   04/08/2015 02:28 PM 5.5 4.8 - 5.6 % Final    Comment:             Pre-diabetes: 5.7 - 6.4          Diabetes: >6.4          Glycemic control for adults with diabetes: <7.0    CBG: No results for input(s): GLUCAP in the last 168 hours.  Simonne Martinet ACNP-BC [facility] Pulmonary/Critical Care Pager # [phone] OR # [phone] if no answer

## 2018-05-05 NOTE — Evaluation (Signed)
Occupational Therapy Evaluation Patient Details Name: [name] [name] MRN: [id number] DOB: April 28, 1932 Today's Date: 05/05/2018    History of Present Illness 84 yo male admitted with AMS, UTI. Hx of HTN, CKD, COPD, syncope, frequent falls, CAD, CHF   Clinical Impression   Pt admitted with above diagnosis and has the deficits listed below. Pt would benefit from cont OT to increase independence with basic adls back to baseline level of functioning so he can return home to care for wife with dementia.  Feel pt will need further therapy before he can safely return home as he was almost unable to care for her before this admission.  Pt has town home and family that can assist but not sure about 24/7.    Follow Up Recommendations  SNF    Equipment Recommendations  3 in 1 bedside commode    Recommendations for Other Services       Precautions / Restrictions Precautions Precautions: Fall Precaution Comments: O2 dep Restrictions Weight Bearing Restrictions: No      Mobility Bed Mobility Overal bed mobility: Needs Assistance Bed Mobility: Supine to Sit     Supine to sit: Mod assist     General bed mobility comments: Pt moving better today than previous session.    Transfers Overall transfer level: Needs assistance Equipment used: Rolling walker (2 wheeled) Transfers: Sit to/from Stand Sit to Stand: Mod assist;+2 physical assistance;+2 safety/equipment         General transfer comment: Pt stood with mod A X2 but became dizzy and returned to sitting.     Balance Overall balance assessment: Needs assistance;History of Falls Sitting-balance support: Feet supported;Bilateral upper extremity supported Sitting balance-Leahy Scale: Fair     Standing balance support: Bilateral upper extremity supported Standing balance-Leahy Scale: Poor Standing balance comment: must have outside support                           ADL either performed or assessed with clinical  judgement   ADL Overall ADL's : Needs assistance/impaired Eating/Feeding: Minimal assistance;Sitting   Grooming: Moderate assistance;Sitting   Upper Body Bathing: Minimal assistance;Sitting Upper Body Bathing Details (indicate cue type and reason): pt fatigues quickly. Lower Body Bathing: Maximal assistance;Sit to/from stand Lower Body Bathing Details (indicate cue type and reason): pt throws pants of floor and then reaches down to pull them up. Upper Body Dressing : Moderate assistance;Sitting   Lower Body Dressing: Total assistance;Sit to/from stand   Toilet Transfer: Moderate assistance;Squat-pivot;BSC Toilet Transfer Details (indicate cue type and reason): lateral scoot.  Will not need drop arm.  Is standing well. Toileting- Clothing Manipulation and Hygiene: Total assistance;Sit to/from stand Toileting - Clothing Manipulation Details (indicate cue type and reason): Pt could stand for short amount of time but then became dizzy.     Tub/Shower Transfer Details (indicate cue type and reason): pt sponge bathes. Functional mobility during ADLs: Moderate assistance;+2 for physical assistance;+2 for safety/equipment;Rolling walker General ADL Comments: Pt is dependent with LE adls at this time but doing better with UE adls.  Pt has not been up in 10 days and did get to chair and stood for appx 20 seconds.  Fatigue a factore.     Vision Baseline Vision/History: Wears glasses Wears Glasses: At all times Patient Visual Report: Blurring of vision;Other (comment)(glasses at home) Vision Assessment?: No apparent visual deficits     Perception     Praxis      Pertinent Vitals/Pain Pain Assessment: No/denies pain  Hand Dominance Right   Extremity/Trunk Assessment Upper Extremity Assessment Upper Extremity Assessment: Generalized weakness       Cervical / Trunk Assessment Cervical / Trunk Assessment: Normal   Communication Communication Communication: No difficulties    Cognition Arousal/Alertness: Lethargic Behavior During Therapy: WFL for tasks assessed/performed Overall Cognitive Status: Impaired/Different from baseline Area of Impairment: Orientation;Problem solving                 Orientation Level: Disoriented to;Time           Problem Solving: Slow processing;Requires verbal cues General Comments: Pt previously drove and took care of all finances.   General Comments  Pt much improved today over Tuesday. Pt itches.    Exercises     Shoulder Instructions      Home Living Family/patient expects to be discharged to:: Skilled nursing facility Living Arrangements: Spouse/significant other Available Help at Discharge: Family Type of Home: House Home Access: Stairs to enter [employer] of Steps: 2   Home Layout: Two level;Able to live on main level with bedroom/bathroom   Alternate Level Stairs-Rails: Right Bathroom Shower/Tub: Producer, television/film/video: Standard     Home Equipment: Environmental consultant - 2 wheels;Cane - single point   Additional Comments: Pt takes care of wife that has dementia. Pt very debilitated himself.  Sometimes uses walker.  Sponge bathes and does not leave home much.  Feel he will need further therapy before returning home.      Prior Functioning/Environment Level of Independence: Independent with assistive device(s)        Comments: Pt occasionally uses walker and was becoming more dependent over last few months with his own care.  Has stopped showering, only goes bed to chair unless he ha to walk.        OT Problem List: Decreased strength;Decreased activity tolerance;Impaired balance (sitting and/or standing);Pain;Decreased knowledge of use of DME or AE;Decreased cognition;Decreased safety awareness      OT Treatment/Interventions: Self-care/ADL training;DME and/or AE instruction;Balance training;Patient/family education;Cognitive remediation/compensation;Therapeutic activities;Energy  conservation    OT Goals(Current goals can be found in the care plan section) Acute Rehab OT Goals Patient Stated Goal: to get stronger. OT Goal Formulation: With patient Time For Goal Achievement: 05/19/18 Potential to Achieve Goals: Good ADL Goals Pt Will Perform Eating: with set-up;sitting Pt Will Perform Grooming: with set-up;sitting Pt Will Perform Upper Body Bathing: with set-up;sitting Pt Will Perform Upper Body Dressing: with set-up;sitting Pt Will Transfer to Toilet: with min guard assist;stand pivot transfer;bedside commode  OT Frequency: Min 2X/week   Barriers to D/C: Decreased caregiver support  wife at home with dementia.       Co-evaluation              AM-PAC PT "6 Clicks" Daily Activity     Outcome Measure Help from another person eating meals?: A Lot Help from another person taking care of personal grooming?: A Lot Help from another person toileting, which includes using toliet, bedpan, or urinal?: A Lot Help from another person bathing (including washing, rinsing, drying)?: A Lot Help from another person to put on and taking off regular upper body clothing?: A Lot Help from another person to put on and taking off regular lower body clothing?: Total 6 Click Score: 11   End of Session Equipment Utilized During Treatment: Rolling walker;Oxygen;Gait belt Nurse Communication: Mobility status  Activity Tolerance: Patient limited by fatigue Patient left: in chair;with call bell/phone within reach;with family/visitor present  OT Visit Diagnosis: Unsteadiness on feet (  R26.81);Muscle weakness (generalized) (M62.81)                Time: 1030-1056 OT Time Calculation (min): 26 min Charges:  OT General Charges $OT Visit: 1 Visit OT Evaluation $OT Eval Moderate Complexity: 1 Mod OT Treatments $Self Care/Home Management : 8-22 mins  Tory Emerald, OTR/L [phone]  Hope Budds 05/05/2018, 11:16 AM

## 2018-05-05 NOTE — Progress Notes (Signed)
CRITICAL VALUE ALERT  Critical Value:  APTT >200  Date & Time Notied:  05/05/18 10:30am  Provider Notified: Dr. Isaiah Serge, [facility], Pharmacy  Orders Received/Actions taken: Heparin drip stopped at this time.

## 2018-05-05 NOTE — Progress Notes (Addendum)
PROGRESS NOTE    [name] [name]  VZC:[id number] DOB: 06-Sep-1931 DOA: 04/26/2018 PCP: Henrine Screws, MD   Brief Narrative:  HPI on 04/26/2018 by Dr. Pearson Grippe  [name] [name]  is a 84 y.o. male, w hypertension, yperlipidemia, Pafib, Aortic insufficiency, CKD stage 3, h/o DVT, Copd, OSA apparently c/o altered mental status.  Apparently left house around noon and ended up in [place], and his son did a missing person, Silver Alert.  Pt seemed more confused than normal.   Interim history Patient admitted for acute metabolic and toxic encephalopathy.  He was found to have UTI as well as severe hypothyroidism which have both been treated.  AMS and resp failure with hypercapnia/hypoxia improving. PCCM consulted. So been evaluated by physical and occupational therapy that recommended SNF placement. Assessment & Plan   Acute metabolic and toxic encephalopathy -Suspect multifactorial including UTI and severe hypothyroidism -Patient presented with severe confusion and was actually found to driven to a different city.  A silver alert was called on the patient.  Initial diagnosis was acute kidney injury as well as UTI and polypharmacy.  Further work-up revealed severe hypothyroidism with a TSH of 157, rechecked to be 138 as well as a free T4 level of 0.2. -Patient was placed on aggressive hydration.  His renal function did improve. -He was placed on IV Synthroid, amount was increased to IV (on orally at home). -Patient was also given 1 dose of oral T3 on 04/30/18 ( cytomel) -also received solumedrol 1g -Hospitalist discussed with patient's family, patient has been becoming more confused lately which is been worsening over the past several months.  It seems that patient was not taking his Synthroid regularly. -CT head unremarkable x 2 -UA was positive for UTI and treated with IV ceftriaxone, transitioned to oral Keflex -Urine culture negative -ABG was checked and showed no evidence of  hypercarbia. (Of note, patient does not tolerate his CPAP nightly) -On 9/4, Discussed with endocrinology- recommended checking cortisol level in the morning. Feels mental status should have improved. May need additional T3 or steroids. Discussed with neurology, Dr. Amada Jupiter, recommended speaking with endocrinology and possibly MRI. Discussed with radiology, it seems patient had several surgeries in the 1960s, including a clip-therefore cannot obtain MRI. -mental status improved today  Acute hypoxic/hypercapnic respiratory failure due to hypervolemia -placed on BiPAP and patient improved -Currently weaned to supplemental oxygen -PCCM consulted and appreciated -patient given IV lasix with good urine output  Cardiac/sinus pauses -Suspect secondary to hypothyroidism vs OSA (uncontrolled) -magnesium and potassium WNL -Cardiology consulted and appreciated, felt no indication for permanent pacemaker at this time and has signed off  Severe hypothyroidism -Treatment and plan as noted above -Patient will need to establish care with endocrinologist for further monitoring as an outpatient  Acute kidney injury on chronic kidney disease, Stage III -patient was given IV Lasix -Recent creatinine approximate 1.6-1.8, currently 2.31 -Continue to monitor BMP closely  Anxiety/mood disorder -home medications: Wellbutrin, BuSpar, Lexapro, Remeron, Lamictal -Suspect his medications are too much for an 84 year old -Currently on Risperdal and Haldol as needed -doses of risperdal decreased  BPH -Continue flomax   COPD/OSA noncompliant with CPAP -Appears to be stable -continue supplemental O2 as needed- currently on BIPAP (see discussion above) -continue duonebs as needed  History of DVT/Paroxysmal Atrial fibrillation -s/p left atrial appendage device placement  -Eliquis held, placed on heparin  Goals of care -Had long and extensive conversation with patient's son and daughter in law at bedside.  Discussed intubation and CPR.  Patient's son feels overwhelmed and is open to further discussion with palliative care. -Palliative care consulted and appreciated. -patient now transitioned to DNR, but son is open to temporary intubation if needed  DVT Prophylaxis  Eliquis --> Heparin  Code Status: DNR  Family Communication: Family at bedside.  Disposition Plan: Admitted. Remain admitted in SDU.  Consultants Neurology, via phone, Dr. Amada Jupiter Endocrinology, via phone, Dr. Carlus Pavlov PCCM Cardiology Palliative care  Procedures  None  Antibiotics   Anti-infectives (From admission, onward)   Start     Dose/Rate Route Frequency Ordered Stop   05/04/18 1200  ceFAZolin (ANCEF) IVPB 1 g/50 mL premix  Status:  Discontinued     1 g 100 mL/hr over 30 Minutes Intravenous Every 8 hours 05/04/18 1104 05/05/18 0832   04/29/18 1000  cephALEXin (KEFLEX) capsule 500 mg  Status:  Discontinued     500 mg Oral Every 12 hours 04/29/18 0841 05/04/18 1033   04/27/18 0600  cefTRIAXone (ROCEPHIN) 1 g in sodium chloride 0.9 % 100 mL IVPB  Status:  Discontinued     1 g 200 mL/hr over 30 Minutes Intravenous Every 24 hours 04/26/18 0638 04/29/18 0840   04/26/18 0530  cefTRIAXone (ROCEPHIN) 1 g in sodium chloride 0.9 % 100 mL IVPB     1 g 200 mL/hr over 30 Minutes Intravenous  Once 04/26/18 0522 04/26/18 1610      Subjective:   [name] [name] seen and examined today.  Has no complaints this morning. Denies current chest pain, abdominal pain, nausea or vomiting, diarrhea or constipation.  Feels breathing is stable. Objective:   Vitals:   05/05/18 0643 05/05/18 0700 05/05/18 0800 05/05/18 0815  BP:  109/72 121/79   Pulse:    76  Resp: 12 13 18 20   Temp:   98.6 F (37 C)   TempSrc:   Oral   SpO2: 96% 97% 96% 98%  Weight:      Height:        Intake/Output Summary (Last 24 hours) at 05/05/2018 1017 Last data filed at 05/05/2018 0700 Gross per 24 hour  Intake 406.83 ml  Output 2550 ml  Net  -2143.17 ml   Filed Weights   05/03/18 0500 05/03/18 1711 05/05/18 0355  Weight: (!) 138.7 kg (!) 139.7 kg (!) 139.1 kg   Exam  General: Well developed, chronically ill-appearing, NAD  HEENT: NCAT, mucous membranes moist.   Neck: Supple  Cardiovascular: S1 S2 auscultated, irregular  Respiratory: Clear to auscultation bilaterally with equal chest rise  Abdomen: Soft, obese, nontender, nondistended, + bowel sounds  Extremities: warm dry without cyanosis clubbing or edema  Neuro: AAOx3, nonfocal  Psych: Normal affect and demeanor with intact judgement and insight, pleasant  Data Reviewed: I have personally reviewed following labs and imaging studies  CBC: Recent Labs  Lab 05/01/18 0425 05/02/18 0432 05/03/18 0428 05/04/18 0333 05/05/18 0311  WBC 5.2 5.3 6.1 6.5 5.9  HGB 11.9* 11.6* 12.8* 11.8* 12.1*  HCT 37.5* 36.9* 41.0 36.9* 37.6*  MCV 98.9 98.7 100.0 97.4 96.4  PLT 171 169 171 155 178   Basic Metabolic Panel: Recent Labs  Lab 04/29/18 0348 04/30/18 0458 05/01/18 0425 05/02/18 0432 05/03/18 0428 05/04/18 0333 05/05/18 0311  NA 138 138 137 138 138 145 140  K 4.2 4.4 3.9 4.1 4.2 4.5 3.7  CL 97* 97* 99 98 97* 102 96*  CO2 30 29 28  32 32 27 29  GLUCOSE 113* 109* 96 105* 114* 86 89  BUN 18 20  20 21 18 21  26*  CREATININE 2.06* 2.11* 1.86* 2.04* 1.88* 1.92* 2.31*  CALCIUM 8.4* 8.6* 8.3* 8.4* 8.8* 8.8* 8.8*  MG  --   --   --   --  2.3  --   --   PHOS 3.2 3.5 3.9 3.6 3.8  --   --    GFR: Estimated Creatinine Clearance: 34.4 mL/min (A) (by C-G formula based on SCr of 2.31 mg/dL (H)). Liver Function Tests: Recent Labs  Lab 04/30/18 0458 05/01/18 0425 05/02/18 0432 05/03/18 0428 05/04/18 0333  AST  --   --   --   --  26  ALT  --   --   --   --  18  ALKPHOS  --   --   --   --  65  BILITOT  --   --   --   --  0.8  PROT  --   --   --   --  6.3*  ALBUMIN 3.6 3.3* 3.3* 3.7 3.4*   No results for input(s): LIPASE, AMYLASE in the last 168 hours. No results  for input(s): AMMONIA in the last 168 hours. Coagulation Profile: No results for input(s): INR, PROTIME in the last 168 hours. Cardiac Enzymes: No results for input(s): CKTOTAL, CKMB, CKMBINDEX, TROPONINI in the last 168 hours. BNP (last 3 results) No results for input(s): PROBNP in the last 8760 hours. HbA1C: No results for input(s): HGBA1C in the last 72 hours. CBG: No results for input(s): GLUCAP in the last 168 hours. Lipid Profile: No results for input(s): CHOL, HDL, LDLCALC, TRIG, CHOLHDL, LDLDIRECT in the last 72 hours. Thyroid Function Tests: No results for input(s): TSH, T4TOTAL, FREET4, T3FREE, THYROIDAB in the last 72 hours. Anemia Panel: No results for input(s): VITAMINB12, FOLATE, FERRITIN, TIBC, IRON, RETICCTPCT in the last 72 hours. Urine analysis:    Component Value Date/Time   COLORURINE AMBER (A) 04/26/2018 0442   APPEARANCEUR HAZY (A) 04/26/2018 0442   LABSPEC 1.033 (H) 04/26/2018 0442   PHURINE 5.0 04/26/2018 0442   GLUCOSEU NEGATIVE 04/26/2018 0442   HGBUR NEGATIVE 04/26/2018 0442   BILIRUBINUR MODERATE (A) 04/26/2018 0442   KETONESUR 5 (A) 04/26/2018 0442   PROTEINUR 30 (A) 04/26/2018 0442   UROBILINOGEN 2.0 (H) 02/21/2015 1241   NITRITE NEGATIVE 04/26/2018 0442   LEUKOCYTESUR TRACE (A) 04/26/2018 0442   Sepsis Labs: @LABRCNTIP (procalcitonin:4,lacticidven:4)  ) Recent Results (from the past 240 hour(s))  Blood culture (routine x 2)     Status: None   Collection Time: 04/26/18  4:43 AM  Result Value Ref Range Status   Specimen Description   Final    BLOOD LEFT ANTECUBITAL Performed at [facility], [address], [place], [place] [address]    Special Requests   Final    BOTTLES DRAWN AEROBIC AND ANAEROBIC Blood Culture adequate volume Performed at [facility], [address] [place], [place] [address]    Culture   Final    NO GROWTH 5 DAYS Performed at [facility], [address] [place],  [place] [address]    Report Status 05/01/2018 FINAL  Final  Urine culture     Status: None   Collection Time: 04/26/18  5:03 AM  Result Value Ref Range Status   Specimen Description   Final    URINE, RANDOM Performed at [facility], [address] [place], [place] [address]    Special Requests   Final    NONE Performed at  [facility], [address] [place], [place] [address]    Culture   Final    NO GROWTH Performed at [facility], [address], [place], [place] [address]    Report Status 04/27/2018 FINAL  Final  Blood culture (routine x 2)     Status: None   Collection Time: 04/26/18  7:46 AM  Result Value Ref Range Status   Specimen Description   Final    BLOOD RIGHT ANTECUBITAL Performed at [facility], [address] [place], [place] [address]    Special Requests   Final    BOTTLES DRAWN AEROBIC AND ANAEROBIC Blood Culture results may not be optimal due to an inadequate volume of blood received in culture bottles Performed at [facility], [address] [place], [place] [address]    Culture   Final    NO GROWTH 5 DAYS Performed at [facility], [address], [place], [place] [address]    Report Status 05/01/2018 FINAL  Final  MRSA PCR Screening     Status: None   Collection Time: 05/04/18  3:08 AM  Result Value Ref Range Status   MRSA by PCR NEGATIVE NEGATIVE Final    Comment:        The GeneXpert MRSA Assay (FDA approved for NASAL specimens only), is one component of a comprehensive MRSA colonization surveillance program. It is not intended to diagnose MRSA infection nor to guide or monitor treatment for MRSA infections. Performed at [facility], [address], [place], [place] [address]       Radiology Studies: Dg Chest Port 1 View  Result Date: 05/04/2018 CLINICAL DATA:  Shortness of breath EXAM: PORTABLE CHEST 1 VIEW COMPARISON:  Chest  x-rays dated 05/03/2017 and 04/27/2018. FINDINGS: Stable cardiomegaly. Overall cardiomediastinal silhouette appears stable. Persistent mild central pulmonary vascular congestion. Persistent bibasilar opacities, likely atelectasis and/or small pleural effusions. IMPRESSION: 1. Cardiomegaly with central pulmonary vascular congestion, suggesting mild CHF/volume overload. 2. Persistent bibasilar opacities, likely atelectasis and/or small pleural effusions. Electronically Signed   By: Bary Richard M.D.   On: 05/04/2018 10:49   Dg Chest Port 1 View  Result Date: 05/03/2018 CLINICAL DATA:  Hypertension atrial fibrillation EXAM: PORTABLE CHEST 1 VIEW COMPARISON:  04/27/2018, 04/26/2018, 01/27/2018 FINDINGS: Post sternotomy changes with valve replacement. Enlarged cardiomediastinal silhouette, gad rated by rotation. Mild central vascular congestion. Probable pleural effusions. Bibasilar airspace disease. No pneumothorax. IMPRESSION: 1. Cardiomegaly with vascular congestion 2. Suspected pleural effusions. Hazy bibasilar airspace disease which may reflect atelectasis or pneumonia. Electronically Signed   By: Jasmine Pang M.D.   On: 05/03/2018 19:45     Scheduled Meds: . chlorhexidine  15 mL Mouth Rinse BID  . furosemide  40 mg Intravenous Daily  . Influenza vac split quadrivalent PF  0.5 mL Intramuscular Tomorrow-1000  . ipratropium-albuterol  3 mL Nebulization TID  . levothyroxine  100 mcg Intravenous Daily  . mouth rinse  15 mL Mouth Rinse q12n4p  . tamsulosin  0.4 mg Oral QPC supper   Continuous Infusions: . heparin 1,400 Units/hr (05/05/18 0700)     LOS: 8 days   Time Spent in minutes   45 minutes (greater than 50% of time spent with patient face to face, as well as reviewing old records, calling consults, and formulating a plan)  Juanice Warburton D.O. on 05/05/2018 at 10:17 AM  Between 7am to 7pm - Please see pager noted on [email or web address]  After 7pm go  to [email or web address]  And look for the night  coverage person covering for me after hours  [facility] Office  [phone]

## 2018-05-05 NOTE — Progress Notes (Signed)
ANTICOAGULATION CONSULT NOTE  Pharmacy Consult for IV heparin (while apixaban on hold) Indication: atrial fibrillation, hx of DVT  Allergies  Allergen Reactions  . Haldol [Haloperidol Lactate]     Over-sedation and at times agitation     Patient Measurements: Height: 6' (182.9 cm) Weight: (!) 306 lb 10.6 oz (139.1 kg) IBW/kg (Calculated) : 77.6 Heparin Dosing Weight: 109.8kg  Vital Signs: Temp: 98.4 F (36.9 C) (09/06 1943) Temp Source: Oral (09/06 1943) BP: 119/74 (09/06 2000)  Labs: Recent Labs    05/03/18 0428 05/04/18 0333  05/04/18 1140 05/04/18 2105 05/05/18 0311 05/05/18 0922 05/05/18 1943  HGB 12.8* 11.8*  --   --   --  12.1*  --   --   HCT 41.0 36.9*  --   --   --  37.6*  --   --   PLT 171 155  --   --   --  178  --   --   APTT  --   --    < > 28 187*  --  >200* 105*  HEPARINUNFRC  --   --   --  >2.20*  --   --  >2.20*  --   CREATININE 1.88* 1.92*  --   --   --  2.31*  --   --    < > = values in this interval not displayed.    Estimated Creatinine Clearance: 34.4 mL/min (A) (by C-G formula based on SCr of 2.31 mg/dL (H)).  Medications:  . heparin      Assessment: 60 y/oM with PMH of PAF, DVT (~ 2013) on apixaban PTA admitted with acute metabolic and toxic encephalopathy. Increased lethargy with hypercarbic respiratory failure overnight, placed on BiPAP and transferred to stepdown. PO meds held, apixaban transitioned to IV heparin. Pharmacy consulted to assist with dosing of heparin.    Last dose of Apixaban 2.5mg  PO was 9/4 at 1034.   Toady, 05/05/2018:  APTT improved, now only slightly supratherapeutic  Heparin level remains > 2.2 (expected false elevation d/t Apixaban use; clearance will be further prolonged by declining renal function)  SCr up to 2.3 (lasix reduced from BID to daily)   CBC: Hgb up to 12.1, Plt WNL.    No bleeding issues currently reported.  Rn notes expected bruising around IV/lab draw sites  Goal of Therapy:  Heparin level  0.3-0.7 units/ml  aPTT 66-102 seconds Monitor platelets by anticoagulation protocol: Yes   Plan:  Decrease heparin infusion to 900 units/hr APTT 8 hours after rate change Daily heparin level and CBC; aPTTs as needed until effects of Factor Xa inhibitor wear off  Continue to monitor for s/s bleeding.  Follow up respiratory status and when able to resume taking PO medications.  Bernadene Person, PharmD, BCPS [phone] 05/05/2018, 10:27 PM

## 2018-05-05 NOTE — Progress Notes (Signed)
ANTICOAGULATION CONSULT NOTE - Consult  Pharmacy Consult for IV heparin (while apixaban on hold) Indication: atrial fibrillation, hx of DVT  Allergies  Allergen Reactions  . Haldol [Haloperidol Lactate]     Over-sedation and at times agitation     Patient Measurements: Height: 6' (182.9 cm) Weight: (!) 307 lb 15.7 oz (139.7 kg) IBW/kg (Calculated) : 77.6 Heparin Dosing Weight: 109.8kg  Vital Signs: Temp: 98.6 F (37 C) (09/05 2323) Temp Source: Axillary (09/05 2323) BP: 89/62 (09/05 2326) Pulse Rate: 73 (09/05 2326)  Labs: Recent Labs    05/02/18 0432 05/03/18 0428 05/04/18 0333 05/04/18 1140 05/04/18 2105  HGB 11.6* 12.8* 11.8*  --   --   HCT 36.9* 41.0 36.9*  --   --   PLT 169 171 155  --   --   APTT  --   --   --  28 187*  HEPARINUNFRC  --   --   --  >2.20*  --   CREATININE 2.04* 1.88* 1.92*  --   --     Estimated Creatinine Clearance: 41.5 mL/min (A) (by C-G formula based on SCr of 1.92 mg/dL (H)).   Medical History: Past Medical History:  Diagnosis Date  . Anxiety   . Aortic valve insufficiency   . Arthritis    "wee bit; knees, elbows" (10/15/2014)  . Atrial fibrillation ([place])   . Cardiomyopathy ([place])   . Chronic kidney disease    "down to ~ 1/2 of their regular use; I see kidney dr. in [place]" (10/15/2014)  . Complication of anesthesia    unsure of complications but pt. states there were complications  . COPD (chronic obstructive pulmonary disease) ([place])    on home o2  . Coronary artery disease   . Decreased testosterone level   . Depression   . Difficult intubation    difficult intubation 12/03/09 and 03/27/10 ([facility]); glidescope used 03/27/10  . DVT (deep venous thrombosis) ([place]) ~ 2013   "BLE"  . Dysrhythmia    A. Fib  . Emphysema of lung ([place])   . Enlarged prostate   . Falls frequently    > 20 times in the last year/notes 10/15/2014  . GERD (gastroesophageal reflux disease)   . Heart murmur   . History of blood transfusion 1960   "lots;  related to an accident"  . History of echocardiogram    post AVR >> Echo 7/16:  Mild LVH, EF 20-25%, AVR ok (peak 15 mmHg, mean 9 mmHg), MAC, mild MR, severe LAE, mild reduced RVSF, mild RAE, PASP 35 mmHg  . Hypercholesteremia   . Hypertension   . Hypothyroidism   . OSA (obstructive sleep apnea)    "suppose to wear mask; I throw it off in my sleep" (10/15/2014)  . Plantar fasciitis   . Right fibular fracture 10/15/2014  . Seasonal allergies   . Shortness of breath dyspnea   . Syncope 10/15/2014  . Syncope and collapse "several times"  . Urine frequency   . Urine incontinence   . Varicose veins      Assessment: 65 y/oM with PMH of PAF, DVT (~ 2013) on apixaban PTA admitted with acute metabolic and toxic encephalopathy. Increased lethargy with hypercarbic respiratory failure overnight, placed on BiPAP and transferred to stepdown. PO meds held, apixaban transitioned to IV heparin. Pharmacy consulted to assist with dosing of heparin.    Last dose of Apixaban 2.54m PO was 9/4 at 1034.   CBC: Hgb decreased to 11.8, Pltc WNL  SCr up  to 1.92  No bleeding issues currently reported  2105 aptt = 187 sec above goal, no infusuon or bleeding issues per RN. Will HOLD heparin drip x 1 hour.   Goal of Therapy:  Heparin level 0.3-0.7 units/ml  aPTT 66-102 seconds Monitor platelets by anticoagulation protocol: Yes   Plan:   Hold heparin drip for 1 hour  Restart heparin drip at 1400 units/hr  Monitor both aPTT and heparin levels since recent Apixaban can falsely elevate heparin levels (initially will use aPTT for dose adjustments); once the two correlate, will monitor with heparin levels only  Recheck aPtt 8 hours after drip restarted  Daily CBC, heparin level  Monitor closely for s/sx of bleeding   Dorrene German 05/04/2018 11:12 AM

## 2018-05-05 NOTE — Progress Notes (Signed)
CRITICAL VALUE ALERT  Critical Value: APTT  Date & Time Notied:   Provider Notified: Pharmacy   Orders Received/Actions taken: HOLD Heparin at this time, Pending orders

## 2018-05-05 NOTE — Progress Notes (Signed)
ANTICOAGULATION CONSULT NOTE  Pharmacy Consult for IV heparin (while apixaban on hold) Indication: atrial fibrillation, hx of DVT  Allergies  Allergen Reactions  . Haldol [Haloperidol Lactate]     Over-sedation and at times agitation     Patient Measurements: Height: 6' (182.9 cm) Weight: (!) 306 lb 10.6 oz (139.1 kg) IBW/kg (Calculated) : 77.6 Heparin Dosing Weight: 109.8kg  Vital Signs: Temp: 97.6 F (36.4 C) (09/06 0317) Temp Source: Axillary (09/06 0317) BP: 84/62 (09/06 0641) Pulse Rate: 70 (09/06 0330)  Labs: Recent Labs    05/03/18 0428 05/04/18 0333 05/04/18 1140 05/04/18 2105 05/05/18 0311  HGB 12.8* 11.8*  --   --  12.1*  HCT 41.0 36.9*  --   --  37.6*  PLT 171 155  --   --  178  APTT  --   --  28 187*  --   HEPARINUNFRC  --   --  >2.20*  --   --   CREATININE 1.88* 1.92*  --   --  2.31*    Estimated Creatinine Clearance: 34.4 mL/min (A) (by C-G formula based on SCr of 2.31 mg/dL (H)).  Medications:  .  ceFAZolin (ANCEF) IV Stopped (05/05/18 0422)  . heparin 1,400 Units/hr (05/05/18 0700)    Assessment: 51 y/oM with PMH of PAF, DVT (~ 2013) on apixaban PTA admitted with acute metabolic and toxic encephalopathy. Increased lethargy with hypercarbic respiratory failure overnight, placed on BiPAP and transferred to stepdown. PO meds held, apixaban transitioned to IV heparin. Pharmacy consulted to assist with dosing of heparin.    Last dose of Apixaban 2.5mg  PO was 9/4 at 1034.   Toady, 05/05/2018:  APTT >200, supratherapeutic and increased despite previous hold and dose reduction last night.   Lab draw seems appropriate.  Heparin infusing through pIV in right arm.  Lab draw reportedly from left arm.  Heparin level remains > 2.2 (expected false elevation d/t Apixaban use; clearance will be further prolonged by declining renal function)  SCr up to 2.3 (lasix reduced from BID to daily)   CBC: Hgb up to 12.1, Plt WNL.    No bleeding issues currently  reported.  Rn notes expected bruising around IV/lab draw sites.  Goal of Therapy:  Heparin level 0.3-0.7 units/ml  aPTT 66-102 seconds Monitor platelets by anticoagulation protocol: Yes   Plan:  HOLD heparin infusion x 1 hour Restart heparin IV infusion at 1000 units/hr  Monitor both aPTT and heparin levels since recent Apixaban can falsely elevate heparin levels (will use aPTT for dose adjustments; once the two correlate, will monitor with heparin levels only) APTT 8 hours after rate change Daily heparin level, APTT, and CBC  Continue to monitor for s/s bleeding.  Follow up respiratory status and when able to resume taking PO medications.  Lynann Beaver PharmD, BCPS Pager [phone] 05/05/2018 7:40 AM

## 2018-05-05 NOTE — Progress Notes (Signed)
Physical Therapy Treatment Patient Details Name: [name] [name] MRN: [id number] DOB: 1932/08/07 Today's Date: 05/05/2018    History of Present Illness 84 yo male admitted with AMS, UTI. Hx of HTN, CKD, COPD, syncope, frequent falls, CAD, CHF    PT Comments    Patient progressing with mobility this session over past two sessions.  He was able to get OOB to chair with OT assist and then stood with +2 min A.  Just unable to maintain due to low BP.   He continues to be appropriate for SNF level rehab upon d/c.  PT to follow.   Follow Up Recommendations  SNF     Equipment Recommendations  None recommended by PT    Recommendations for Other Services       Precautions / Restrictions Precautions Precautions: Fall Precaution Comments: O2 dep Restrictions Weight Bearing Restrictions: No    Mobility  Bed Mobility Overal bed mobility: Needs Assistance Bed Mobility: Supine to Sit     Supine to sit: Mod assist     General bed mobility comments: up in chair s/p OT  Transfers Overall transfer level: Needs assistance Equipment used: Rolling walker (2 wheeled) Transfers: Sit to/from Stand Sit to Stand: Mod assist;+2 physical assistance;+2 safety/equipment         General transfer comment: Pt stood with mod A X2 but became dizzy and returned to sitting.   Ambulation/Gait                 Stairs             Wheelchair Mobility    Modified Rankin (Stroke Patients Only)       Balance Overall balance assessment: Needs assistance;History of Falls Sitting-balance support: Feet supported;Bilateral upper extremity supported Sitting balance-Leahy Scale: Fair     Standing balance support: Bilateral upper extremity supported Standing balance-Leahy Scale: Poor Standing balance comment: must have outside support                            Cognition Arousal/Alertness: Lethargic Behavior During Therapy: WFL for tasks assessed/performed Overall Cognitive  Status: Impaired/Different from baseline Area of Impairment: Orientation;Problem solving                 Orientation Level: Disoriented to;Time           Problem Solving: Slow processing;Requires verbal cues General Comments: Pt previously drove and took care of all finances.      Exercises General Exercises - Lower Extremity Ankle Circles/Pumps: AROM;15 reps;Both;Seated Short Arc Quad: AROM;10 reps;Both;Seated Heel Slides: AAROM;10 reps;Both;Seated Straight Leg Raises: AROM;Both;5 reps;Seated    General Comments General comments (skin integrity, edema, etc.): BP sitting prior to standing 80/54; some symptoms in standing so returned to sitting and propped legs and lowered head; BP 94/63      Pertinent Vitals/Pain Pain Assessment: No/denies pain    Home Living Family/patient expects to be discharged to:: Skilled nursing facility Living Arrangements: Spouse/significant other Available Help at Discharge: Family Type of Home: House Home Access: Stairs to enter   Home Layout: Two level;Able to live on main level with bedroom/bathroom Home Equipment: Dan Humphreys - 2 wheels;Cane - single point Additional Comments: Pt takes care of wife that has dementia. Pt very debilitated himself.  Sometimes uses walker.  Sponge bathes and does not leave home much.  Feel he will need further therapy before returning home.    Prior Function Level of Independence: Independent with assistive device(s)  Comments: Pt occasionally uses walker and was becoming more dependent over last few months with his own care.  Has stopped showering, only goes bed to chair unless he ha to walk.   PT Goals (current goals can now be found in the care plan section) Acute Rehab PT Goals Patient Stated Goal: to get stronger. Progress towards PT goals: Progressing toward goals    Frequency    Min 2X/week      PT Plan Current plan remains appropriate    Co-evaluation              AM-PAC PT "6  Clicks" Daily Activity  Outcome Measure  Difficulty turning over in bed (including adjusting bedclothes, sheets and blankets)?: Unable Difficulty moving from lying on back to sitting on the side of the bed? : Unable Difficulty sitting down on and standing up from a chair with arms (e.g., wheelchair, bedside commode, etc,.)?: Unable Help needed moving to and from a bed to chair (including a wheelchair)?: A Lot Help needed walking in hospital room?: A Lot Help needed climbing 3-5 steps with a railing? : Total 6 Click Score: 8    End of Session Equipment Utilized During Treatment: Gait belt;Oxygen Activity Tolerance: Patient limited by fatigue Patient left: in chair;with call bell/phone within reach;with family/visitor present   PT Visit Diagnosis: Muscle weakness (generalized) (M62.81);Difficulty in walking, not elsewhere classified (R26.2)     Time: 5038-8828 PT Time Calculation (min) (ACUTE ONLY): 18 min  Charges:  $Therapeutic Activity: 8-22 mins                     [place], [place] [phone] 05/05/2018    Elray Mcgregor 05/05/2018, 1:13 PM

## 2018-05-05 NOTE — Care Management Note (Signed)
Case Management Note  Patient Details  Name: [name] [name] MRN: [id number] Date of Birth: Jan 06, 1932  Subjective/Objective:                  84 y.o.male,w hypertension, yperlipidemia, Pafib, Aortic insufficiency, CKD stage 3, h/o DVT, Copd, OSA apparently c/oaltered mental status. Apparently left house around noon and ended up in [place], and his son did a missing person, Silver Alert. Pt seemed more confused than normal.   Action/Plan: Patient admitted for acute metabolic and toxic encephalopathy.  He was found to have UTI as well as severe hypothyroidism which have both been treated.  AMS and resp failure with hypercapnia/hypoxia improving. PCCM consulted. So been evaluated by physical and occupational therapy that recommended SNF placement. Hospitalization Return to top of Urinary Tract Infection (UTI) RRG - ISC Goal Length of Stay: Ambulatory or 2 days  Note: Goal Length of Stay assumes optimal recovery, decision making, and care. Patients may be discharged to a lower level of care (either later than or sooner than the goal) when it is appropriate for their clinical status and care needs. Discharge Readiness Return to top of Urinary Tract Infection (UTI) RRG - ISC  Discharge readiness is indicated by patient meeting Recovery Milestones, including ALL of the following: ? Hemodynamic stability on bipap for resp failure ? Fever absent or reduced=99.1 ? Vomiting absent or improved none ? Urine output adequate low urina outpt ? Renal function at baseline or acceptable for next level of care ? Bun-26/creat.=2.31 ? Pain absent or managed ? Ambulatory/bed rest ? Oral hydration, medications,[L] and diet ? Iv heparin 100units/ml at 32ml/hr ?   Expected Discharge Date:  (unknown)               Expected Discharge Plan:  Home/Self Care  In-House Referral:     Discharge planning Services  CM Consult  Post Acute Care Choice:    Choice offered to:     DME Arranged:    DME Agency:      HH Arranged:    HH Agency:     Status of Service:  In process, will continue to follow  If discussed at Long Length of Stay Meetings, dates discussed:    Additional Comments:  Golda Acre, RN 05/05/2018, 11:39 AM

## 2018-05-06 DIAGNOSIS — J9621 Acute and chronic respiratory failure with hypoxia: Secondary | ICD-10-CM

## 2018-05-06 DIAGNOSIS — J9622 Acute and chronic respiratory failure with hypercapnia: Secondary | ICD-10-CM

## 2018-05-06 DIAGNOSIS — D649 Anemia, unspecified: Secondary | ICD-10-CM

## 2018-05-06 LAB — CBC
HEMATOCRIT: 35.7 % — AB (ref 39.0–52.0)
HEMOGLOBIN: 11.5 g/dL — AB (ref 13.0–17.0)
MCH: 31.2 pg (ref 26.0–34.0)
MCHC: 32.2 g/dL (ref 30.0–36.0)
MCV: 96.7 fL (ref 78.0–100.0)
Platelets: 167 10*3/uL (ref 150–400)
RBC: 3.69 MIL/uL — ABNORMAL LOW (ref 4.22–5.81)
RDW: 15.8 % — AB (ref 11.5–15.5)
WBC: 5.5 10*3/uL (ref 4.0–10.5)

## 2018-05-06 LAB — BASIC METABOLIC PANEL
ANION GAP: 13 (ref 5–15)
BUN: 28 mg/dL — ABNORMAL HIGH (ref 8–23)
CALCIUM: 8.7 mg/dL — AB (ref 8.9–10.3)
CO2: 30 mmol/L (ref 22–32)
Chloride: 97 mmol/L — ABNORMAL LOW (ref 98–111)
Creatinine, Ser: 2.36 mg/dL — ABNORMAL HIGH (ref 0.61–1.24)
GFR calc non Af Amer: 24 mL/min — ABNORMAL LOW (ref >60)
GFR, EST AFRICAN AMERICAN: 27 mL/min — AB (ref >60)
Glucose, Bld: 100 mg/dL — ABNORMAL HIGH (ref 70–99)
Potassium: 3.1 mmol/L — ABNORMAL LOW (ref 3.5–5.1)
Sodium: 140 mmol/L (ref 135–145)

## 2018-05-06 LAB — HEPARIN LEVEL (UNFRACTIONATED): Heparin Unfractionated: 1.14 IU/mL — ABNORMAL HIGH (ref 0.30–0.70)

## 2018-05-06 LAB — PROCALCITONIN

## 2018-05-06 LAB — APTT: APTT: 79 s — AB (ref 24–36)

## 2018-05-06 MED ORDER — LEVOTHYROXINE SODIUM 100 MCG PO TABS
200.0000 ug | ORAL_TABLET | Freq: Every day | ORAL | Status: DC
  Administered 2018-05-06 – 2018-05-10 (×5): 200 ug via ORAL
  Filled 2018-05-06 (×5): qty 2

## 2018-05-06 MED ORDER — APIXABAN 2.5 MG PO TABS
2.5000 mg | ORAL_TABLET | Freq: Two times a day (BID) | ORAL | Status: DC
  Administered 2018-05-06 – 2018-05-10 (×9): 2.5 mg via ORAL
  Filled 2018-05-06 (×9): qty 1

## 2018-05-06 MED ORDER — IPRATROPIUM-ALBUTEROL 0.5-2.5 (3) MG/3ML IN SOLN
3.0000 mL | Freq: Two times a day (BID) | RESPIRATORY_TRACT | Status: DC
  Administered 2018-05-06 – 2018-05-09 (×4): 3 mL via RESPIRATORY_TRACT
  Filled 2018-05-06 (×5): qty 3

## 2018-05-06 MED ORDER — POTASSIUM CHLORIDE CRYS ER 20 MEQ PO TBCR
40.0000 meq | EXTENDED_RELEASE_TABLET | Freq: Once | ORAL | Status: AC
  Administered 2018-05-06: 40 meq via ORAL
  Filled 2018-05-06: qty 2

## 2018-05-06 NOTE — Progress Notes (Addendum)
ANTICOAGULATION CONSULT NOTE  Pharmacy Consult for IV heparin (while apixaban on hold) Indication: atrial fibrillation, hx of DVT  Allergies  Allergen Reactions  . Haldol [Haloperidol Lactate]     Over-sedation and at times agitation     Patient Measurements: Height: 6' (182.9 cm) Weight: (!) 301 lb 9.4 oz (136.8 kg) IBW/kg (Calculated) : 77.6 Heparin Dosing Weight: 109.8kg  Vital Signs: Temp: 98.2 F (36.8 C) (09/07 0347) Temp Source: Oral (09/07 0347) BP: 143/84 (09/07 0700) Pulse Rate: 71 (09/06 2340)  Labs: Recent Labs    05/04/18 0333 05/04/18 1140  05/05/18 0311 05/05/18 0922 05/05/18 1943 05/06/18 0323  HGB 11.8*  --   --  12.1*  --   --  11.5*  HCT 36.9*  --   --  37.6*  --   --  35.7*  PLT 155  --   --  178  --   --  167  APTT  --  28   < >  --  >200* 105* 79*  HEPARINUNFRC  --  >2.20*  --   --  >2.20*  --  1.14*  CREATININE 1.92*  --   --  2.31*  --   --  2.36*   < > = values in this interval not displayed.    Estimated Creatinine Clearance: 33.4 mL/min (A) (by C-G formula based on SCr of 2.36 mg/dL (H)).   Assessment: 82 y/oM with PMH of PAF, DVT (~ 2013) on apixaban PTA admitted with acute metabolic and toxic encephalopathy. Increased lethargy with hypercarbic respiratory failure overnight, placed on BiPAP and transferred to stepdown. PO meds held, apixaban transitioned to IV heparin. Pharmacy consulted to assist with dosing of heparin.    Last dose of Apixaban 2.5mg  PO was 9/4 at 1034.   [place], 05/06/2018:  aPTT improved to therapeutic range, 79 seconds, on heparin infusion at 900 units/hr   Heparin level remains elevated, but trending down (expected false elevation d/t recent Apixaban use; clearance will be further prolonged by declining renal function)  SCr up to 2.36 (lasix discontinued by MD)   CBC: Hgb decreased to 11.5, Pltc WNL.    No bleeding issues currently reported.  RN notes expected bruising around IV/lab draw sites.   Goal of  Therapy:  Heparin level 0.3-0.7 units/ml  aPTT 66-102 seconds Monitor platelets by anticoagulation protocol: Yes   Plan:  Continue heparin infusion at 900 units/hr aPTT at 1200 to confirm remains within therapeutic range Daily heparin level and CBC; aPTTs as needed until effects of Factor Xa inhibitor wear off  Continue to monitor for s/sx of bleeding.   Greer Pickerel, PharmD, BCPS Pager: [phone] 05/06/2018 7:28 AM    Addendum:   Pharmacy asked to transition patient back to Apixaban for PAF, hx of DVT.    Age > 80  SCr > 1.5  CrCl ~ 45 ml/min (using actual body weight)  DVT several years ago   Plan:  Stop heparin infusion.  Resume home dose of Apixaban 2.5mg  PO BID.  Cancel aPTT, HL  CBC in AM  Monitor closely for s/sx of bleeding   Greer Pickerel, PharmD, BCPS Pager: [phone] 05/06/2018 10:23 AM

## 2018-05-06 NOTE — Progress Notes (Signed)
PROGRESS NOTE    [name] [name]  ZOX:[id number] DOB: [birth year] DOA: 04/26/2018 PCP: Henrine Screws, MD   Brief Narrative:  HPI on 04/26/2018 by Dr. Pearson Grippe  [name] [name]  is a 84 y.o. male, w hypertension, yperlipidemia, Pafib, Aortic insufficiency, CKD stage 3, h/o DVT, Copd, OSA apparently c/o altered mental status.  Apparently left house around noon and ended up in [place], and his son did a missing person, Silver Alert.  Pt seemed more confused than normal.   Interim history Patient admitted for acute metabolic and toxic encephalopathy.  He was found to have UTI as well as severe hypothyroidism which have both been treated.  AMS and resp failure with hypercapnia/hypoxia improving. PCCM consulted. So been evaluated by physical and occupational therapy that recommended SNF placement. Assessment & Plan   Acute metabolic and toxic encephalopathy -Suspect multifactorial including UTI and severe hypothyroidism -Patient presented with severe confusion and was actually found to driven to a different city.  A silver alert was called on the patient.  Initial diagnosis was acute kidney injury as well as UTI and polypharmacy.  Further work-up revealed severe hypothyroidism with a TSH of 157, rechecked to be 138 as well as a free T4 level of 0.2. -Patient was placed on aggressive hydration.  His renal function did improve. -He was placed on IV Synthroid, amount was increased to IV (on orally at home). -Patient was also given 1 dose of oral T3 on 04/30/18 ( cytomel) -also received solumedrol 1g -Hospitalist discussed with patient's family, patient has been becoming more confused lately which is been worsening over the past several months.  It seems that patient was not taking his Synthroid regularly. -CT head unremarkable x 2 -UA was positive for UTI and treated with IV ceftriaxone, transitioned to oral Keflex- completed course -Urine culture negative -ABG was checked and showed no  evidence of hypercarbia. (Of note, patient does not tolerate his CPAP nightly) -On 9/4, Discussed with endocrinology- recommended checking cortisol level in the morning. Feels mental status should have improved. May need additional T3 or steroids. Discussed with neurology, Dr. Amada Jupiter, recommended speaking with endocrinology and possibly MRI. Discussed with radiology, it seems patient had several surgeries in the 1960s, including a clip-therefore cannot obtain MRI. -Mental status has improved and now appears to be at baseline  Acute hypoxic/hypercapnic respiratory failure due to hypervolemia -placed on BiPAP and patient improved -Currently weaned to supplemental oxygen -PCCM consulted and appreciated -patient was given IV lasix with good urine output  Cardiac/sinus pauses -Suspect secondary to hypothyroidism vs OSA (uncontrolled) -magnesium and potassium WNL -Cardiology consulted and appreciated, felt no indication for permanent pacemaker at this time and has signed off  Severe hypothyroidism -Treatment and plan as noted above -Patient will need to establish care with endocrinologist for further monitoring as an outpatient -will switch patient to oral synthroid today,  Acute kidney injury on chronic kidney disease, Stage III -patient was given IV Lasix -Recent creatinine approximate 1.6-1.8, currently 2.36 -Continue to monitor BMP closely  Anxiety/mood disorder -home medications: Wellbutrin, BuSpar, Lexapro, Remeron, Lamictal -Suspect his medications are too much for an 84 year old -Risperdal and Haldol held  BPH -Continue flomax   COPD/OSA noncompliant with CPAP -Appears to be stable -continue supplemental O2 as needed- currently on BIPAP (see discussion above) -continue duonebs as needed  History of DVT/Paroxysmal Atrial fibrillation -s/p left atrial appendage device placement  -Eliquis held, placed on heparin  Goals of care -Had long and extensive conversation  with patient's  son and daughter in law at bedside. Discussed intubation and CPR. Patient's son feels overwhelmed and is open to further discussion with palliative care. -Palliative care consulted and appreciated. -patient now transitioned to DNR  Physical deconditioning -PT/OT recommended SNF -Social work c/s.   DVT Prophylaxis  Eliquis --> Heparin  Code Status: DNR  Family Communication: Family at bedside.  Disposition Plan: Admitted. Mental status improved, will move to tele.   Consultants Neurology, via phone, Dr. Amada Jupiter Endocrinology, via phone, Dr. Carlus Pavlov PCCM Cardiology Palliative care  Procedures  None  Antibiotics   Anti-infectives (From admission, onward)   Start     Dose/Rate Route Frequency Ordered Stop   05/04/18 1200  ceFAZolin (ANCEF) IVPB 1 g/50 mL premix  Status:  Discontinued     1 g 100 mL/hr over 30 Minutes Intravenous Every 8 hours 05/04/18 1104 05/05/18 0832   04/29/18 1000  cephALEXin (KEFLEX) capsule 500 mg  Status:  Discontinued     500 mg Oral Every 12 hours 04/29/18 0841 05/04/18 1033   04/27/18 0600  cefTRIAXone (ROCEPHIN) 1 g in sodium chloride 0.9 % 100 mL IVPB  Status:  Discontinued     1 g 200 mL/hr over 30 Minutes Intravenous Every 24 hours 04/26/18 0638 04/29/18 0840   04/26/18 0530  cefTRIAXone (ROCEPHIN) 1 g in sodium chloride 0.9 % 100 mL IVPB     1 g 200 mL/hr over 30 Minutes Intravenous  Once 04/26/18 0522 04/26/18 4098      Subjective:   [name] seen and examined today.  Patient feeling better today.  Has no current complaints.  Denies current chest pain, shortness of breath, abdominal pain, nausea vomiting, diarrhea constipation, dizziness or headache.  Family at bedside feels patient is improved.  Objective:   Vitals:   05/06/18 0500 05/06/18 0700 05/06/18 0800 05/06/18 0900  BP:  (!) 143/84 (!) 168/110 (!) 146/77  Pulse:      Resp:  19 (!) 23 (!) 21  Temp:   98.5 F (36.9 C)   TempSrc:   Oral     SpO2:  91% 99% 91%  Weight: (!) 136.8 kg     Height:        Intake/Output Summary (Last 24 hours) at 05/06/2018 0958 Last data filed at 05/06/2018 0300 Gross per 24 hour  Intake 423.47 ml  Output 350 ml  Net 73.47 ml   Filed Weights   05/03/18 1711 05/05/18 0355 05/06/18 0500  Weight: (!) 139.7 kg (!) 139.1 kg (!) 136.8 kg   Exam  General: Well developed, chronically ill-appearing, no apparent distress  HEENT: NCAT, mucous membranes moist.   Neck: Supple  Cardiovascular: S1 S2 auscultated, irregular  Respiratory: Clear to auscultation bilaterally with equal chest rise  Abdomen: Soft, obese, nontender, nondistended, + bowel sounds  Extremities: warm dry without cyanosis clubbing or edema  Neuro: AAOx3, nonfocal  Psych: Normal affect and demeanor, pleasant  Data Reviewed: I have personally reviewed following labs and imaging studies  CBC: Recent Labs  Lab 05/02/18 0432 05/03/18 0428 05/04/18 0333 05/05/18 0311 05/06/18 0323  WBC 5.3 6.1 6.5 5.9 5.5  HGB 11.6* 12.8* 11.8* 12.1* 11.5*  HCT 36.9* 41.0 36.9* 37.6* 35.7*  MCV 98.7 100.0 97.4 96.4 96.7  PLT 169 171 155 178 167   Basic Metabolic Panel: Recent Labs  Lab 04/30/18 0458 05/01/18 0425 05/02/18 0432 05/03/18 0428 05/04/18 0333 05/05/18 0311 05/06/18 0323  NA 138 137 138 138 145 140 140  K 4.4 3.9 4.1 4.2  4.5 3.7 3.1*  CL 97* 99 98 97* 102 96* 97*  CO2 29 28 32 32 27 29 30   GLUCOSE 109* 96 105* 114* 86 89 100*  BUN 20 20 21 18 21  26* 28*  CREATININE 2.11* 1.86* 2.04* 1.88* 1.92* 2.31* 2.36*  CALCIUM 8.6* 8.3* 8.4* 8.8* 8.8* 8.8* 8.7*  MG  --   --   --  2.3  --   --   --   PHOS 3.5 3.9 3.6 3.8  --   --   --    GFR: Estimated Creatinine Clearance: 33.4 mL/min (A) (by C-G formula based on SCr of 2.36 mg/dL (H)). Liver Function Tests: Recent Labs  Lab 04/30/18 0458 05/01/18 0425 05/02/18 0432 05/03/18 0428 05/04/18 0333  AST  --   --   --   --  26  ALT  --   --   --   --  18  ALKPHOS  --    --   --   --  65  BILITOT  --   --   --   --  0.8  PROT  --   --   --   --  6.3*  ALBUMIN 3.6 3.3* 3.3* 3.7 3.4*   No results for input(s): LIPASE, AMYLASE in the last 168 hours. No results for input(s): AMMONIA in the last 168 hours. Coagulation Profile: No results for input(s): INR, PROTIME in the last 168 hours. Cardiac Enzymes: No results for input(s): CKTOTAL, CKMB, CKMBINDEX, TROPONINI in the last 168 hours. BNP (last 3 results) No results for input(s): PROBNP in the last 8760 hours. HbA1C: No results for input(s): HGBA1C in the last 72 hours. CBG: No results for input(s): GLUCAP in the last 168 hours. Lipid Profile: No results for input(s): CHOL, HDL, LDLCALC, TRIG, CHOLHDL, LDLDIRECT in the last 72 hours. Thyroid Function Tests: No results for input(s): TSH, T4TOTAL, FREET4, T3FREE, THYROIDAB in the last 72 hours. Anemia Panel: No results for input(s): VITAMINB12, FOLATE, FERRITIN, TIBC, IRON, RETICCTPCT in the last 72 hours. Urine analysis:    Component Value Date/Time   COLORURINE AMBER (A) 04/26/2018 0442   APPEARANCEUR HAZY (A) 04/26/2018 0442   LABSPEC 1.033 (H) 04/26/2018 0442   PHURINE 5.0 04/26/2018 0442   GLUCOSEU NEGATIVE 04/26/2018 0442   HGBUR NEGATIVE 04/26/2018 0442   BILIRUBINUR MODERATE (A) 04/26/2018 0442   KETONESUR 5 (A) 04/26/2018 0442   PROTEINUR 30 (A) 04/26/2018 0442   UROBILINOGEN 2.0 (H) 02/21/2015 1241   NITRITE NEGATIVE 04/26/2018 0442   LEUKOCYTESUR TRACE (A) 04/26/2018 0442   Sepsis Labs: @LABRCNTIP (procalcitonin:4,lacticidven:4)  ) Recent Results (from the past 240 hour(s))  MRSA PCR Screening     Status: None   Collection Time: 05/04/18  3:08 AM  Result Value Ref Range Status   MRSA by PCR NEGATIVE NEGATIVE Final    Comment:        The GeneXpert MRSA Assay (FDA approved for NASAL specimens only), is one component of a comprehensive MRSA colonization surveillance program. It is not intended to diagnose MRSA infection nor  to guide or monitor treatment for MRSA infections. Performed at [facility], [address] [place], [place] [address]       Radiology Studies: No results found.   Scheduled Meds: . chlorhexidine  15 mL Mouth Rinse BID  . ipratropium-albuterol  3 mL Nebulization TID  . levothyroxine  200 mcg Oral QAC breakfast  . mouth rinse  15 mL Mouth Rinse q12n4p  . tamsulosin  0.4 mg Oral QPC supper   Continuous Infusions: . heparin 900 Units/hr (05/06/18 0256)     LOS: 9 days   Time Spent in minutes   35 minutes  Om Lizotte D.O. on 05/06/2018 at 9:58 AM  Between 7am to 7pm - Please see pager noted on [email or web address]  After 7pm go to [email or web address]  And look for the night coverage person covering for me after hours  [facility] Office  [phone]

## 2018-05-07 LAB — BASIC METABOLIC PANEL
ANION GAP: 13 (ref 5–15)
BUN: 22 mg/dL (ref 8–23)
CHLORIDE: 99 mmol/L (ref 98–111)
CO2: 31 mmol/L (ref 22–32)
Calcium: 8.8 mg/dL — ABNORMAL LOW (ref 8.9–10.3)
Creatinine, Ser: 2.12 mg/dL — ABNORMAL HIGH (ref 0.61–1.24)
GFR calc Af Amer: 31 mL/min — ABNORMAL LOW (ref >60)
GFR calc non Af Amer: 27 mL/min — ABNORMAL LOW (ref >60)
GLUCOSE: 105 mg/dL — AB (ref 70–99)
POTASSIUM: 3.8 mmol/L (ref 3.5–5.1)
Sodium: 143 mmol/L (ref 135–145)

## 2018-05-07 LAB — CBC
HEMATOCRIT: 34.4 % — AB (ref 39.0–52.0)
HEMOGLOBIN: 11 g/dL — AB (ref 13.0–17.0)
MCH: 31.3 pg (ref 26.0–34.0)
MCHC: 32 g/dL (ref 30.0–36.0)
MCV: 97.7 fL (ref 78.0–100.0)
Platelets: 165 10*3/uL (ref 150–400)
RBC: 3.52 MIL/uL — ABNORMAL LOW (ref 4.22–5.81)
RDW: 15.8 % — ABNORMAL HIGH (ref 11.5–15.5)
WBC: 4.6 10*3/uL (ref 4.0–10.5)

## 2018-05-07 NOTE — Progress Notes (Signed)
PROGRESS NOTE    [name] [name]  AFB:[id number] DOB: 05/11/1932 DOA: 04/26/2018 PCP: Henrine Screws, MD   Brief Narrative:  HPI on 04/26/2018 by Dr. Pearson Grippe  [name] [name]  is a 84 y.o. male, w hypertension, yperlipidemia, Pafib, Aortic insufficiency, CKD stage 3, h/o DVT, Copd, OSA apparently c/o altered mental status.  Apparently left house around noon and ended up in [place], and his son did a missing person, Silver Alert.  Pt seemed more confused than normal.   Interim history Patient admitted for acute metabolic and toxic encephalopathy.  He was found to have UTI as well as severe hypothyroidism which have both been treated.  AMS and resp failure with hypercapnia/hypoxia improving. PCCM consulted. So been evaluated by physical and occupational therapy that recommended SNF placement. Assessment & Plan   Acute metabolic and toxic encephalopathy -Suspect multifactorial including UTI and severe hypothyroidism -Patient presented with severe confusion and was actually found to driven to a different city.  A silver alert was called on the patient.  Initial diagnosis was acute kidney injury as well as UTI and polypharmacy.  Further work-up revealed severe hypothyroidism with a TSH of 157, rechecked to be 138 as well as a free T4 level of 0.2. -Patient was placed on aggressive hydration.  His renal function did improve. -He was placed on IV Synthroid, amount was increased to IV (on orally at home). -Patient was also given 1 dose of oral T3 on 04/30/18 ( cytomel) -also received solumedrol 1g -Hospitalist discussed with patient's family, patient has been becoming more confused lately which is been worsening over the past several months.  It seems that patient was not taking his Synthroid regularly. -CT head unremarkable x 2 -UA was positive for UTI and treated with IV ceftriaxone, transitioned to oral Keflex- completed course -Urine culture negative -ABG was checked and showed no  evidence of hypercarbia. (Of note, patient does not tolerate his CPAP nightly) -On 9/4, Discussed with endocrinology- recommended checking cortisol level in the morning. Feels mental status should have improved. May need additional T3 or steroids. Discussed with neurology, Dr. Amada Jupiter, recommended speaking with endocrinology and possibly MRI. Discussed with radiology, it seems patient had several surgeries in the 1960s, including a clip-therefore cannot obtain MRI. -Mental status has improved and appears to be at baseline  Acute hypoxic/hypercapnic respiratory failure due to hypervolemia -placed on BiPAP and patient improved -Currently weaned to supplemental oxygen -PCCM consulted and appreciated -patient was given IV lasix with good urine output  Cardiac/sinus pauses -Suspect secondary to hypothyroidism vs OSA (uncontrolled) -magnesium and potassium WNL -Cardiology consulted and appreciated, felt no indication for permanent pacemaker at this time and has signed off  Severe hypothyroidism -Treatment and plan as noted above -Patient will need to establish care with endocrinologist for further monitoring as an outpatient -Continue oral synthroid  Acute kidney injury on chronic kidney disease, Stage III -patient was given IV Lasix -Recent creatinine approximate 1.6-1.8, currently 2.12 -Continue to monitor BMP closely  Anxiety/mood disorder -home medications: Wellbutrin, BuSpar, Lexapro, Remeron, Lamictal -Suspect his medications are too much for an 84 year old -Risperdal and Haldol held  BPH -Continue flomax   COPD/OSA noncompliant with CPAP -Appears to be stable -continue supplemental O2 as needed- currently on BIPAP (see discussion above) -continue duonebs as needed  History of DVT/Paroxysmal Atrial fibrillation -s/p left atrial appendage device placement  -Restarted Eliquis  Goals of care -Had long and extensive conversation with patient's son and daughter in law  at bedside. Discussed  intubation and CPR. Patient's son feels overwhelmed and is open to further discussion with palliative care. -Palliative care consulted and appreciated. -patient now transitioned to DNR  Physical deconditioning -PT/OT recommended SNF -Social work c/s.   DVT Prophylaxis  Eliquis   Code Status: DNR  Family Communication: None at bedside.  Disposition Plan: Admitted. Pending further improvement. Suspect discharge to SNF in 1-2 days  Consultants Neurology, via phone, Dr. Amada Jupiter Endocrinology, via phone, Dr. Carlus Pavlov PCCM Cardiology Palliative care  Procedures  None  Antibiotics   Anti-infectives (From admission, onward)   Start     Dose/Rate Route Frequency Ordered Stop   05/04/18 1200  ceFAZolin (ANCEF) IVPB 1 g/50 mL premix  Status:  Discontinued     1 g 100 mL/hr over 30 Minutes Intravenous Every 8 hours 05/04/18 1104 05/05/18 0832   04/29/18 1000  cephALEXin (KEFLEX) capsule 500 mg  Status:  Discontinued     500 mg Oral Every 12 hours 04/29/18 0841 05/04/18 1033   04/27/18 0600  cefTRIAXone (ROCEPHIN) 1 g in sodium chloride 0.9 % 100 mL IVPB  Status:  Discontinued     1 g 200 mL/hr over 30 Minutes Intravenous Every 24 hours 04/26/18 0638 04/29/18 0840   04/26/18 0530  cefTRIAXone (ROCEPHIN) 1 g in sodium chloride 0.9 % 100 mL IVPB     1 g 200 mL/hr over 30 Minutes Intravenous  Once 04/26/18 0522 04/26/18 1610      Subjective:   [name] [name] seen and examined today.  Patient complained of some abdominal upset. Denies current chest pain, shortness of breath, nausea, vomiting, diarrhea, constipation, dizziness, headache.   Objective:   Vitals:   05/06/18 2223 05/07/18 0601 05/07/18 0605 05/07/18 0750  BP: 127/81  (!) 137/94   Pulse: 86  86 85  Resp: 20  20   Temp: 98.2 F (36.8 C)  98.6 F (37 C)   TempSrc: Oral  Oral   SpO2: 96%  96% 95%  Weight:  129.5 kg    Height:        Intake/Output Summary (Last 24 hours) at 05/07/2018  1338 Last data filed at 05/07/2018 0620 Gross per 24 hour  Intake 340 ml  Output 300 ml  Net 40 ml   Filed Weights   05/05/18 0355 05/06/18 0500 05/07/18 0601  Weight: (!) 139.1 kg (!) 136.8 kg 129.5 kg   Exam  General: Well developed, chronically ill appearing, NAD  HEENT: NCAT, mucous membranes moist.   Neck: Supple  Cardiovascular: S1 S2 auscultated, irregular  Respiratory: Clear to auscultation bilaterally with equal chest rise  Abdomen: Soft, obese, nontender, nondistended, + bowel sounds  Extremities: warm dry without cyanosis clubbing or edema  Neuro: AAOx3, nonfocal  Psych: Pleasant, appropriate mood and affect  Data Reviewed: I have personally reviewed following labs and imaging studies  CBC: Recent Labs  Lab 05/03/18 0428 05/04/18 0333 05/05/18 0311 05/06/18 0323 05/07/18 0427  WBC 6.1 6.5 5.9 5.5 4.6  HGB 12.8* 11.8* 12.1* 11.5* 11.0*  HCT 41.0 36.9* 37.6* 35.7* 34.4*  MCV 100.0 97.4 96.4 96.7 97.7  PLT 171 155 178 167 165   Basic Metabolic Panel: Recent Labs  Lab 05/01/18 0425 05/02/18 0432 05/03/18 0428 05/04/18 0333 05/05/18 0311 05/06/18 0323 05/07/18 0427  NA 137 138 138 145 140 140 143  K 3.9 4.1 4.2 4.5 3.7 3.1* 3.8  CL 99 98 97* 102 96* 97* 99  CO2 28 32 32 27 29 30 31   GLUCOSE 96 105* 114*  86 89 100* 105*  BUN 20 21 18 21  26* 28* 22  CREATININE 1.86* 2.04* 1.88* 1.92* 2.31* 2.36* 2.12*  CALCIUM 8.3* 8.4* 8.8* 8.8* 8.8* 8.7* 8.8*  MG  --   --  2.3  --   --   --   --   PHOS 3.9 3.6 3.8  --   --   --   --    GFR: Estimated Creatinine Clearance: 36.1 mL/min (A) (by C-G formula based on SCr of 2.12 mg/dL (H)). Liver Function Tests: Recent Labs  Lab 05/01/18 0425 05/02/18 0432 05/03/18 0428 05/04/18 0333  AST  --   --   --  26  ALT  --   --   --  18  ALKPHOS  --   --   --  65  BILITOT  --   --   --  0.8  PROT  --   --   --  6.3*  ALBUMIN 3.3* 3.3* 3.7 3.4*   No results for input(s): LIPASE, AMYLASE in the last 168  hours. No results for input(s): AMMONIA in the last 168 hours. Coagulation Profile: No results for input(s): INR, PROTIME in the last 168 hours. Cardiac Enzymes: No results for input(s): CKTOTAL, CKMB, CKMBINDEX, TROPONINI in the last 168 hours. BNP (last 3 results) No results for input(s): PROBNP in the last 8760 hours. HbA1C: No results for input(s): HGBA1C in the last 72 hours. CBG: No results for input(s): GLUCAP in the last 168 hours. Lipid Profile: No results for input(s): CHOL, HDL, LDLCALC, TRIG, CHOLHDL, LDLDIRECT in the last 72 hours. Thyroid Function Tests: No results for input(s): TSH, T4TOTAL, FREET4, T3FREE, THYROIDAB in the last 72 hours. Anemia Panel: No results for input(s): VITAMINB12, FOLATE, FERRITIN, TIBC, IRON, RETICCTPCT in the last 72 hours. Urine analysis:    Component Value Date/Time   COLORURINE AMBER (A) 04/26/2018 0442   APPEARANCEUR HAZY (A) 04/26/2018 0442   LABSPEC 1.033 (H) 04/26/2018 0442   PHURINE 5.0 04/26/2018 0442   GLUCOSEU NEGATIVE 04/26/2018 0442   HGBUR NEGATIVE 04/26/2018 0442   BILIRUBINUR MODERATE (A) 04/26/2018 0442   KETONESUR 5 (A) 04/26/2018 0442   PROTEINUR 30 (A) 04/26/2018 0442   UROBILINOGEN 2.0 (H) 02/21/2015 1241   NITRITE NEGATIVE 04/26/2018 0442   LEUKOCYTESUR TRACE (A) 04/26/2018 0442   Sepsis Labs: @LABRCNTIP (procalcitonin:4,lacticidven:4)  ) Recent Results (from the past 240 hour(s))  MRSA PCR Screening     Status: None   Collection Time: 05/04/18  3:08 AM  Result Value Ref Range Status   MRSA by PCR NEGATIVE NEGATIVE Final    Comment:        The GeneXpert MRSA Assay (FDA approved for NASAL specimens only), is one component of a comprehensive MRSA colonization surveillance program. It is not intended to diagnose MRSA infection nor to guide or monitor treatment for MRSA infections. Performed at [facility], [address], [place], [place] [address]       Radiology Studies: No  results found.   Scheduled Meds: . apixaban  2.5 mg Oral BID  . chlorhexidine  15 mL Mouth Rinse BID  . ipratropium-albuterol  3 mL Nebulization BID  . levothyroxine  200 mcg Oral QAC breakfast  . mouth rinse  15 mL Mouth Rinse q12n4p  . tamsulosin  0.4 mg Oral QPC supper   Continuous Infusions:    LOS: 10 days   Time Spent in minutes   30 minutes  Keyron Pokorski D.O. on 05/07/2018 at 1:38 PM  Between 7am to 7pm - Please see pager noted on [email or web address]  After 7pm go to [email or web address]  And look for the night coverage person covering for me after hours  [facility] Office  [phone]

## 2018-05-07 NOTE — Plan of Care (Signed)

## 2018-05-07 NOTE — Progress Notes (Signed)
Pt refuses BIPAP. 

## 2018-05-08 LAB — CBC
HEMATOCRIT: 35.9 % — AB (ref 39.0–52.0)
Hemoglobin: 11.2 g/dL — ABNORMAL LOW (ref 13.0–17.0)
MCH: 30.7 pg (ref 26.0–34.0)
MCHC: 31.2 g/dL (ref 30.0–36.0)
MCV: 98.4 fL (ref 78.0–100.0)
Platelets: 167 10*3/uL (ref 150–400)
RBC: 3.65 MIL/uL — ABNORMAL LOW (ref 4.22–5.81)
RDW: 15.6 % — ABNORMAL HIGH (ref 11.5–15.5)
WBC: 4.9 10*3/uL (ref 4.0–10.5)

## 2018-05-08 LAB — BASIC METABOLIC PANEL
Anion gap: 11 (ref 5–15)
BUN: 18 mg/dL (ref 8–23)
CALCIUM: 8.9 mg/dL (ref 8.9–10.3)
CHLORIDE: 100 mmol/L (ref 98–111)
CO2: 32 mmol/L (ref 22–32)
CREATININE: 1.84 mg/dL — AB (ref 0.61–1.24)
GFR calc non Af Amer: 32 mL/min — ABNORMAL LOW (ref >60)
GFR, EST AFRICAN AMERICAN: 37 mL/min — AB (ref >60)
GLUCOSE: 106 mg/dL — AB (ref 70–99)
Potassium: 3.8 mmol/L (ref 3.5–5.1)
Sodium: 143 mmol/L (ref 135–145)

## 2018-05-08 NOTE — Care Management Important Message (Signed)
Important Message  Patient Details  Name: Zakariyya Chilson MRN: [id number] Date of Birth: [birth year]   Medicare Important Message Given:  Yes    [name] 05/08/2018, 12:12 PMImportant Message  Patient Details  Name: Taylar Denn MRN: [id number] Date of Birth: [birth year]   Medicare Important Message Given:  Yes    [name] 05/08/2018, 12:12 PM

## 2018-05-08 NOTE — Progress Notes (Signed)
PROGRESS NOTE    [name] [name]  XJO:[id number] DOB: [birth year] DOA: 04/26/2018 PCP: Henrine Screws, MD   Brief Narrative:  HPI on 04/26/2018 by Dr. Pearson Grippe  [name] [name]  is a 84 y.o. male, w hypertension, yperlipidemia, Pafib, Aortic insufficiency, CKD stage 3, h/o DVT, Copd, OSA apparently c/o altered mental status.  Apparently left house around noon and ended up in [place], and his son did a missing person, Silver Alert.  Pt seemed more confused than normal.   Interim history Patient admitted for acute metabolic and toxic encephalopathy.  He was found to have UTI as well as severe hypothyroidism which have both been treated.  AMS and resp failure with hypercapnia/hypoxia improving. PCCM consulted. So been evaluated by physical and occupational therapy that recommended SNF placement. Assessment & Plan   Acute metabolic and toxic encephalopathy -Suspect multifactorial including UTI and severe hypothyroidism -Patient presented with severe confusion and was actually found to driven to a different city.  A silver alert was called on the patient.  Initial diagnosis was acute kidney injury as well as UTI and polypharmacy.  Further work-up revealed severe hypothyroidism with a TSH of 157, rechecked to be 138 as well as a free T4 level of 0.2. -Patient was placed on aggressive hydration.  His renal function did improve. -He was placed on IV Synthroid, amount was increased to IV (on orally at home). -Patient was also given 1 dose of oral T3 on 04/30/18 ( cytomel) -also received solumedrol 1g -Hospitalist discussed with patient's family, patient has been becoming more confused lately which is been worsening over the past several months.  It seems that patient was not taking his Synthroid regularly. -CT head unremarkable x 2 -UA was positive for UTI and treated with IV ceftriaxone, transitioned to oral Keflex- completed course -Urine culture negative -ABG was checked and showed no  evidence of hypercarbia. (Of note, patient does not tolerate his CPAP nightly) -On 9/4, Discussed with endocrinology- recommended checking cortisol level in the morning. Feels mental status should have improved. May need additional T3 or steroids. Discussed with neurology, Dr. Amada Jupiter, recommended speaking with endocrinology and possibly MRI. Discussed with radiology, it seems patient had several surgeries in the 1960s, including a clip-therefore cannot obtain MRI. -Mental status has improved and appears to be at baseline  Acute hypoxic/hypercapnic respiratory failure due to hypervolemia -placed on BiPAP and patient improved -Currently weaned to supplemental oxygen -PCCM consulted and appreciated -patient was given IV lasix with good urine output  Cardiac/sinus pauses -Suspect secondary to hypothyroidism vs OSA (uncontrolled) -magnesium and potassium WNL -Cardiology consulted and appreciated, felt no indication for permanent pacemaker at this time and has signed off  Severe hypothyroidism -Treatment and plan as noted above -Patient will need to establish care with endocrinologist for further monitoring as an outpatient -Continue oral synthroid  Acute kidney injury on chronic kidney disease, Stage III -patient was given IV Lasix -Recent creatinine approximate 1.6-1.8, currently 1.84 -Continue to monitor BMP closely  Anxiety/mood disorder -home medications: Wellbutrin, BuSpar, Lexapro, Remeron, Lamictal -Suspect his medications are too much for an 84 year old -Risperdal and Haldol held  BPH -Continue flomax   COPD/OSA noncompliant with CPAP -Appears to be stable -continue supplemental O2 as needed- currently on BIPAP (see discussion above) -continue duonebs as needed  History of DVT/Paroxysmal Atrial fibrillation -s/p left atrial appendage device placement  -Restarted Eliquis  Goals of care -Had long and extensive conversation with patient's son and daughter in law  at bedside. Discussed  intubation and CPR. Patient's son feels overwhelmed and is open to further discussion with palliative care. -Palliative care consulted and appreciated. -patient now transitioned to DNR  Physical deconditioning -PT/OT recommended SNF -Social work consulted for SNF  DVT Prophylaxis  Eliquis   Code Status: DNR  Family Communication: None at bedside.  Disposition Plan: Admitted. Pending further improvement. Suspect discharge to SNF in 1-2 days  Consultants Neurology, via phone, Dr. Amada Jupiter Endocrinology, via phone, Dr. Carlus Pavlov PCCM Cardiology Palliative care  Procedures  None  Antibiotics   Anti-infectives (From admission, onward)   Start     Dose/Rate Route Frequency Ordered Stop   05/04/18 1200  ceFAZolin (ANCEF) IVPB 1 g/50 mL premix  Status:  Discontinued     1 g 100 mL/hr over 30 Minutes Intravenous Every 8 hours 05/04/18 1104 05/05/18 0832   04/29/18 1000  cephALEXin (KEFLEX) capsule 500 mg  Status:  Discontinued     500 mg Oral Every 12 hours 04/29/18 0841 05/04/18 1033   04/27/18 0600  cefTRIAXone (ROCEPHIN) 1 g in sodium chloride 0.9 % 100 mL IVPB  Status:  Discontinued     1 g 200 mL/hr over 30 Minutes Intravenous Every 24 hours 04/26/18 0638 04/29/18 0840   04/26/18 0530  cefTRIAXone (ROCEPHIN) 1 g in sodium chloride 0.9 % 100 mL IVPB     1 g 200 mL/hr over 30 Minutes Intravenous  Once 04/26/18 0522 04/26/18 4098      Subjective:   [name] [name] seen and examined today.  Patient feeling better this morning. Denies current chest pain, shortness of breath, abdominal pain, N/V/D/C.   Objective:   Vitals:   05/07/18 2024 05/07/18 2143 05/08/18 0517 05/08/18 1159  BP: 138/84  (!) 142/94 (!) 169/115  Pulse: 73  94 98  Resp: 20  16   Temp: 98.3 F (36.8 C)  98.2 F (36.8 C)   TempSrc: Oral  Oral   SpO2: 99% 98% 92% 92%  Weight:   (!) 139.4 kg   Height:        Intake/Output Summary (Last 24 hours) at 05/08/2018 1206 Last  data filed at 05/08/2018 0500 Gross per 24 hour  Intake 120 ml  Output 300 ml  Net -180 ml   Filed Weights   05/06/18 0500 05/07/18 0601 05/08/18 0517  Weight: (!) 136.8 kg 129.5 kg (!) 139.4 kg   Exam  General: Well developed, well nourished, NAD, appears stated age  HEENT: NCAT, mucous membranes moist.   Neck: Supple  Cardiovascular: S1 S2 auscultated, irregular  Respiratory: Clear to auscultation bilaterally with equal chest rise  Abdomen: Soft, obese, nontender, nondistended, + bowel sounds  Extremities: warm dry without cyanosis clubbing or edema  Neuro: AAOx3, nonfocal  Psych: Normal affect and demeanor, pleasant  Data Reviewed: I have personally reviewed following labs and imaging studies  CBC: Recent Labs  Lab 05/04/18 0333 05/05/18 0311 05/06/18 0323 05/07/18 0427 05/08/18 0419  WBC 6.5 5.9 5.5 4.6 4.9  HGB 11.8* 12.1* 11.5* 11.0* 11.2*  HCT 36.9* 37.6* 35.7* 34.4* 35.9*  MCV 97.4 96.4 96.7 97.7 98.4  PLT 155 178 167 165 167   Basic Metabolic Panel: Recent Labs  Lab 05/02/18 0432 05/03/18 0428 05/04/18 0333 05/05/18 0311 05/06/18 0323 05/07/18 0427 05/08/18 0419  NA 138 138 145 140 140 143 143  K 4.1 4.2 4.5 3.7 3.1* 3.8 3.8  CL 98 97* 102 96* 97* 99 100  CO2 32 32 27 29 30 31  32  GLUCOSE 105* 114*  86 89 100* 105* 106*  BUN 21 18 21  26* 28* 22 18  CREATININE 2.04* 1.88* 1.92* 2.31* 2.36* 2.12* 1.84*  CALCIUM 8.4* 8.8* 8.8* 8.8* 8.7* 8.8* 8.9  MG  --  2.3  --   --   --   --   --   PHOS 3.6 3.8  --   --   --   --   --    GFR: Estimated Creatinine Clearance: 43.2 mL/min (A) (by C-G formula based on SCr of 1.84 mg/dL (H)). Liver Function Tests: Recent Labs  Lab 05/02/18 0432 05/03/18 0428 05/04/18 0333  AST  --   --  26  ALT  --   --  18  ALKPHOS  --   --  65  BILITOT  --   --  0.8  PROT  --   --  6.3*  ALBUMIN 3.3* 3.7 3.4*   No results for input(s): LIPASE, AMYLASE in the last 168 hours. No results for input(s): AMMONIA in the  last 168 hours. Coagulation Profile: No results for input(s): INR, PROTIME in the last 168 hours. Cardiac Enzymes: No results for input(s): CKTOTAL, CKMB, CKMBINDEX, TROPONINI in the last 168 hours. BNP (last 3 results) No results for input(s): PROBNP in the last 8760 hours. HbA1C: No results for input(s): HGBA1C in the last 72 hours. CBG: No results for input(s): GLUCAP in the last 168 hours. Lipid Profile: No results for input(s): CHOL, HDL, LDLCALC, TRIG, CHOLHDL, LDLDIRECT in the last 72 hours. Thyroid Function Tests: No results for input(s): TSH, T4TOTAL, FREET4, T3FREE, THYROIDAB in the last 72 hours. Anemia Panel: No results for input(s): VITAMINB12, FOLATE, FERRITIN, TIBC, IRON, RETICCTPCT in the last 72 hours. Urine analysis:    Component Value Date/Time   COLORURINE AMBER (A) 04/26/2018 0442   APPEARANCEUR HAZY (A) 04/26/2018 0442   LABSPEC 1.033 (H) 04/26/2018 0442   PHURINE 5.0 04/26/2018 0442   GLUCOSEU NEGATIVE 04/26/2018 0442   HGBUR NEGATIVE 04/26/2018 0442   BILIRUBINUR MODERATE (A) 04/26/2018 0442   KETONESUR 5 (A) 04/26/2018 0442   PROTEINUR 30 (A) 04/26/2018 0442   UROBILINOGEN 2.0 (H) 02/21/2015 1241   NITRITE NEGATIVE 04/26/2018 0442   LEUKOCYTESUR TRACE (A) 04/26/2018 0442   Sepsis Labs: @LABRCNTIP (procalcitonin:4,lacticidven:4)  ) Recent Results (from the past 240 hour(s))  MRSA PCR Screening     Status: None   Collection Time: 05/04/18  3:08 AM  Result Value Ref Range Status   MRSA by PCR NEGATIVE NEGATIVE Final    Comment:        The GeneXpert MRSA Assay (FDA approved for NASAL specimens only), is one component of a comprehensive MRSA colonization surveillance program. It is not intended to diagnose MRSA infection nor to guide or monitor treatment for MRSA infections. Performed at [facility], [address], [place], [place] [address]       Radiology Studies: No results found.   Scheduled Meds: . apixaban   2.5 mg Oral BID  . chlorhexidine  15 mL Mouth Rinse BID  . ipratropium-albuterol  3 mL Nebulization BID  . levothyroxine  200 mcg Oral QAC breakfast  . mouth rinse  15 mL Mouth Rinse q12n4p  . tamsulosin  0.4 mg Oral QPC supper   Continuous Infusions:    LOS: 11 days   Time Spent in minutes   30 minutes  Ashiyah Pavlak D.O. on 05/08/2018 at 12:06 PM  Between 7am to 7pm - Please see pager noted on [email or web address]  After 7pm  go to [email or web address]  And look for the night coverage person covering for me after hours  [facility] Office  [phone]

## 2018-05-08 NOTE — Progress Notes (Signed)
Physical Therapy Treatment Patient Details Name: [name] [name] MRN: [id number] DOB: [birth year] Today's Date: 05/08/2018    History of Present Illness 84 yo male admitted with AMS, UTI. Hx of HTN, CKD, COPD, syncope, frequent falls, CAD, CHF    PT Comments    Pt assisted to sitting however BP elevated so deferred further mobility at this time and notified RN.   Follow Up Recommendations  SNF     Equipment Recommendations  None recommended by PT    Recommendations for Other Services       Precautions / Restrictions Precautions Precautions: Fall Precaution Comments: O2 dep    Mobility  Bed Mobility Overal bed mobility: Needs Assistance Bed Mobility: Supine to Sit     Supine to sit: Min assist     General bed mobility comments: assist for trunk upright, verbal cues for technique and self assisting; SPO2 room air 92% (pt on room air on arrival) BP elevated upon sitting, checked again in L forearm and 160/120 mmHg so RN notified.  Pt's lunch had arrived and pt requested to eat sitting upright at EOB (RN notified and bed alarm on)  Transfers                 General transfer comment: not attempted due to high BP  Ambulation/Gait                 Stairs             Wheelchair Mobility    Modified Rankin (Stroke Patients Only)       Balance Overall balance assessment: Needs assistance;History of Falls Sitting-balance support: Feet supported;Bilateral upper extremity supported Sitting balance-Leahy Scale: Fair                                      Cognition Arousal/Alertness: Awake/alert Behavior During Therapy: WFL for tasks assessed/performed Overall Cognitive Status: Impaired/Different from baseline Area of Impairment: Orientation;Problem solving                             Problem Solving: Slow processing;Requires verbal cues        Exercises      General Comments        Pertinent Vitals/Pain Pain  Assessment: No/denies pain    Home Living                      Prior Function            PT Goals (current goals can now be found in the care plan section) Acute Rehab PT Goals PT Goal Formulation: With patient Time For Goal Achievement: 05/15/18 Potential to Achieve Goals: Good Progress towards PT goals: Progressing toward goals    Frequency    Min 2X/week      PT Plan Current plan remains appropriate    Co-evaluation              AM-PAC PT "6 Clicks" Daily Activity  Outcome Measure  Difficulty turning over in bed (including adjusting bedclothes, sheets and blankets)?: Unable Difficulty moving from lying on back to sitting on the side of the bed? : Unable Difficulty sitting down on and standing up from a chair with arms (e.g., wheelchair, bedside commode, etc,.)?: Unable Help needed moving to and from a bed to chair (including a wheelchair)?: A Lot Help needed walking in hospital  room?: A Lot Help needed climbing 3-5 steps with a railing? : Total 6 Click Score: 8    End of Session   Activity Tolerance: Treatment limited secondary to medical complications (Comment)(elevated BP) Patient left: in bed;with bed alarm set;with call bell/phone within reach Nurse Communication: Mobility status PT Visit Diagnosis: Muscle weakness (generalized) (M62.81);Difficulty in walking, not elsewhere classified (R26.2)     Time: 7035-0093 PT Time Calculation (min) (ACUTE ONLY): 14 min  Charges:  $Therapeutic Activity: 8-22 mins                    Zenovia Jarred, PT, DPT 05/08/2018 Pager: [phone]  Maida Sale E 05/08/2018, 1:27 PM

## 2018-05-08 NOTE — Plan of Care (Signed)
°  Problem: Clinical Measurements: °Goal: Ability to maintain clinical measurements within normal limits will improve °Outcome: Progressing °  °Problem: Nutrition: °Goal: Adequate nutrition will be maintained °Outcome: Progressing °  °Problem: Safety: °Goal: Ability to remain free from injury will improve °Outcome: Progressing °  °Problem: Skin Integrity: °Goal: Risk for impaired skin integrity will decrease °Outcome: Progressing °  °

## 2018-05-08 NOTE — Progress Notes (Signed)
CSW following for disposition- pt planning to admit to [facility] at [place]. Facility updated on status today and is in process of insurance authorization request.  Ilean Skill, MSW, LCSW Clinical Social Work 05/08/2018 [phone]

## 2018-05-09 DIAGNOSIS — Z515 Encounter for palliative care: Secondary | ICD-10-CM

## 2018-05-09 DIAGNOSIS — Z7189 Other specified counseling: Secondary | ICD-10-CM

## 2018-05-09 LAB — BASIC METABOLIC PANEL
ANION GAP: 11 (ref 5–15)
BUN: 16 mg/dL (ref 8–23)
CO2: 34 mmol/L — AB (ref 22–32)
Calcium: 9.3 mg/dL (ref 8.9–10.3)
Chloride: 98 mmol/L (ref 98–111)
Creatinine, Ser: 1.76 mg/dL — ABNORMAL HIGH (ref 0.61–1.24)
GFR, EST AFRICAN AMERICAN: 39 mL/min — AB (ref >60)
GFR, EST NON AFRICAN AMERICAN: 34 mL/min — AB (ref >60)
Glucose, Bld: 123 mg/dL — ABNORMAL HIGH (ref 70–99)
Potassium: 3.5 mmol/L (ref 3.5–5.1)
Sodium: 143 mmol/L (ref 135–145)

## 2018-05-09 LAB — BLOOD GAS, ARTERIAL
Acid-Base Excess: 7.4 mmol/L — ABNORMAL HIGH (ref 0.0–2.0)
Bicarbonate: 32.1 mmol/L — ABNORMAL HIGH (ref 20.0–28.0)
DRAWN BY: 257881
O2 Saturation: 92.7 %
Patient temperature: 98.6
pCO2 arterial: 47.5 mmHg (ref 32.0–48.0)
pH, Arterial: 7.445 (ref 7.350–7.450)
pO2, Arterial: 66.7 mmHg — ABNORMAL LOW (ref 83.0–108.0)

## 2018-05-09 MED ORDER — LEVOTHYROXINE SODIUM 200 MCG PO TABS: 200.0000 ug | ORAL_TABLET | Freq: Every day | ORAL | Status: DC

## 2018-05-09 MED ORDER — IPRATROPIUM-ALBUTEROL 0.5-2.5 (3) MG/3ML IN SOLN: 3.0000 mL | Freq: Four times a day (QID) | RESPIRATORY_TRACT | Status: DC | PRN

## 2018-05-09 NOTE — Discharge Summary (Addendum)
Physician Discharge Summary  [name] [name] ZOX:[id number] DOB: October 20, 1931 DOA: 04/26/2018  PCP: [name] Screws, MD  Admit date: 04/26/2018 Discharge date: 05/09/2018  Time spent: 45 minutes  Recommendations for Outpatient Follow-up:  Patient will be discharged to skilled nursing facility, continue physical and occupational therapy.  Repeat thyroid function tests. Patient will need to follow up with primary care provider within one week of discharge.  Patient will need to follow up with a pulmonologist for a sleep study. Patient should continue medications as prescribed.  Patient should follow a heart healthy diet.   Discharge Diagnoses:  Acute metabolic and toxic encephalopathy Acute hypoxic/hypercapnic respiratory failure due to hypervolemia Cardiac/sinus pauses Severe hypothyroidism Acute kidney injury on chronic kidney disease, Stage III Anxiety/mood disorder BPH COPD/OSA noncompliant with CPAP History of DVT/Paroxysmal Atrial fibrillation Goals of care Physical deconditioning  Discharge Condition: Stable   Diet recommendation: heart healthy  Filed Weights   05/07/18 0601 05/08/18 0517 05/09/18 0510  Weight: 129.5 kg (!) 139.4 kg (!) 140.2 kg    History of present illness:  on 04/26/2018 by Dr. Pearson [name]  [name] [name] a84 y.o.male,w hypertension, yperlipidemia, Pafib, Aortic insufficiency, CKD stage 3, h/o DVT, Copd, OSA apparently c/oaltered mental status. Apparently left house around noon and ended up in [place], and his son did a missing person, Silver Alert. Pt seemed more confused than normal.   Hospital Course:  Acute metabolic and toxic encephalopathy -Suspect multifactorial including UTI and severe hypothyroidism -Patient presented with severe confusion and was actually found to driven to a different city.  A silver alert was called on the patient.  Initial diagnosis was acute kidney injury as well as UTI and polypharmacy.  Further work-up revealed severe  hypothyroidism with a TSH of 157, rechecked to be 138 as well as a free T4 level of 0.2. -Patient was placed on aggressive hydration.  His renal function did improve. -He was placed on IV Synthroid, amount was increased to IV (on orally at home). -Patient was also given 1 dose of oral T3 on 04/30/18 ( cytomel) -also received solumedrol 1g -Hospitalist discussed with patient's family, patient has been becoming more confused lately which is been worsening over the past several months.  It seems that patient was not taking his Synthroid regularly. -CT head unremarkable x 2 -UA was positive for UTI and treated with IV ceftriaxone, transitioned to oral Keflex- completed course -Urine culture negative -ABG was checked and showed no evidence of hypercarbia. (Of note, patient does not tolerate his CPAP nightly) -On 9/4, Discussed with endocrinology- recommended checking cortisol level in the morning. Feels mental status should have improved. May need additional T3 or steroids. Discussed with neurology, Dr. Amada Jupiter, recommended speaking with endocrinology and possibly MRI. Discussed with radiology, it seems patient had several surgeries in the 1960s, including a clip-therefore cannot obtain MRI. -Mental status has improved and appears to be at baseline -Discussed with son, would recommend patient follow up with PCP regarding possible dementia workup as well as medications (psych)  Acute hypoxic/hypercapnic respiratory failure due to hypervolemia -placed on BiPAP and patient improved -Currently weaned to supplemental oxygen -PCCM consulted and appreciated -patient was given IV lasix with good urine output  Cardiac/sinus pauses -Suspect secondary to hypothyroidism vs OSA (uncontrolled) -magnesium and potassium WNL -Cardiology consulted and appreciated, felt no indication for permanent pacemaker at this time and has signed off  Severe hypothyroidism -Treatment and plan as noted  above -Patient will need to establish care with endocrinologist for further monitoring as an  outpatient -Continue oral synthroid  Acute kidney injury on chronic kidney disease, Stage III -patient was given IV Lasix -Recent creatinine approximate 1.6-1.8, currently 1.76  Anxiety/mood disorder -home medications: Wellbutrin, BuSpar, Lexapro, Remeron, Lamictal -Suspect his medications are too much for an 84 year old -Risperdal and Haldol held  BPH -Continue flomax   COPD/OSA noncompliant with CPAP -Appears to be stable -continue supplemental O2 as needed- currently on BIPAP (see discussion above) -continue duonebs as needed -patient will need an outpatient sleep study- will need to be arranged through pulmonology or PCP  History of DVT/Paroxysmal Atrial fibrillation -s/p left atrial appendage device placement  -Restarted Eliquis  Goals of care -Had long and extensive conversation with patient's son and daughter in law at bedside. Discussed intubation and CPR. Patient's son feels overwhelmed and is open to further discussion with palliative care. -Palliative care consulted and appreciated. -patient now transitioned to DNR  Physical deconditioning -PT/OT recommended SNF -Social work consulted for [facility]  Consultants Neurology, via phone, Dr. Amada Jupiter Endocrinology, via phone, Dr. Carlus Pavlov PCCM Cardiology Palliative care  Procedures  None  Discharge Exam: Vitals:   05/09/18 0510 05/09/18 1500  BP: 132/89 129/79  Pulse: 93 89  Resp: 16 18  Temp: 97.7 F (36.5 C) 98 F (36.7 C)  SpO2: 94% 95%   Patient feeling better. Has no complaints today. Denies chest pain, shortness of breath, abdominal pain, N/V/D/C.    General: Well developed, well nourished, NAD, appears stated age  HEENT: NCAT, mucous membranes moist.  Neck: Supple  Cardiovascular: S1 S2 auscultated, irregular  Respiratory: Clear to auscultation bilaterally with equal chest  rise  Abdomen: Soft, obese, nontender, nondistended, + bowel sounds  Extremities: warm dry without cyanosis clubbing or edema  Neuro: AAOx3, nonfocal  Psych: Normal affect and demeanor, pleasant  Discharge Instructions Discharge Instructions    Discharge instructions   Complete by:  As directed    Patient will be discharged to skilled nursing facility, continue physical and occupational therapy.  Repeat thyroid function tests. Patient will need to follow up with primary care provider within one week of discharge.  Patient will need to follow up with a pulmonologist for a sleep study. Patient should continue medications as prescribed.  Patient should follow a heart healthy diet.     Allergies as of 05/09/2018      Reactions   Haldol [haloperidol Lactate]    Over-sedation and at times agitation       Medication List    STOP taking these medications   buPROPion 300 MG 24 hr tablet Commonly known as:  WELLBUTRIN XL   busPIRone 15 MG tablet Commonly known as:  BUSPAR   calcium carbonate 500 MG chewable tablet Commonly known as:  TUMS - dosed in mg elemental calcium   clonazePAM 0.5 MG tablet Commonly known as:  KLONOPIN   escitalopram 10 MG tablet Commonly known as:  LEXAPRO   famotidine 20 MG tablet Commonly known as:  PEPCID   guaiFENesin 600 MG 12 hr tablet Commonly known as:  MUCINEX   lamoTRIgine 100 MG tablet Commonly known as:  LAMICTAL   tolnaftate 1 % powder Commonly known as:  TINACTIN     TAKE these medications   apixaban 2.5 MG Tabs tablet Commonly known as:  ELIQUIS Take 1 tablet (2.5 mg total) by mouth 2 (two) times daily.   atorvastatin 10 MG tablet Commonly known as:  LIPITOR Take 1 tablet (10 mg total) by mouth daily.   carvedilol 25 MG tablet Commonly  known as:  COREG Take 25 mg by mouth 2 (two) times daily with a meal.   dutasteride 0.5 MG capsule Commonly known as:  AVODART Take 1 capsule (0.5 mg total) by mouth daily.   folic acid  1 MG tablet Commonly known as:  FOLVITE Take 1 tablet (1 mg total) by mouth daily.   furosemide 20 MG tablet Commonly known as:  LASIX Take 1 tablet (20 mg total) by mouth daily as needed for fluid. Only if you gain > 3 lb in 24 hrs   ipratropium-albuterol 0.5-2.5 (3) MG/3ML Soln Commonly known as:  DUONEB Take 3 mLs by nebulization every 4 (four) hours as needed (for wheezing associated with shortness of breath).   levothyroxine 200 MCG tablet Commonly known as:  SYNTHROID, LEVOTHROID Take 1 tablet (200 mcg total) by mouth daily before breakfast. Start taking on:  05/10/2018 What changed:    medication strength  how much to take   mirtazapine 7.5 MG tablet Commonly known as:  REMERON Take 15 mg by mouth at bedtime.   omeprazole 20 MG capsule Commonly known as:  PRILOSEC Take 1 capsule (20 mg total) by mouth daily.   polyethylene glycol packet Commonly known as:  MIRALAX / GLYCOLAX Take 17 g by mouth daily as needed for mild constipation.   potassium chloride 10 MEQ tablet Commonly known as:  K-DUR TAKE 1 TABLET BY MOUTH EVERY DAY. KEEP UPCOMING APPOINTMENT IN JULY FOR FUTURE REFILLS.   senna 8.6 MG Tabs tablet Commonly known as:  SENOKOT Take 1 tablet (8.6 mg total) by mouth 2 (two) times daily.   Tiotropium Bromide-Olodaterol 2.5-2.5 MCG/ACT Aers Inhale 2 puffs into the lungs daily.      Allergies  Allergen Reactions  . Haldol [Haloperidol Lactate]     Over-sedation and at times agitation     Contact information for follow-up providers    [name] Screws, MD. Schedule an appointment as soon as possible for a visit in 1 week(s).   Specialty:  Family Medicine Why:  Hospital follow up, referral to pulm for sleep study Contact information: [address], [address] [place] [address] [phone]        Nahser, Deloris Ping, MD .   Specialty:  Cardiology Contact information: [address] [place] [address] [phone]             Contact information for after-discharge care    Destination    [facility] Preferred SNF .   Service:  Skilled Nursing Contact information: [address] [place] [address] [phone]                   The results of significant diagnostics from this hospitalization (including imaging, microbiology, ancillary and laboratory) are listed below for reference.    Significant Diagnostic Studies: Dg Chest 2 View  Result Date: 04/26/2018 CLINICAL DATA:  84 year old male with shortness of breath and altered mental status. EXAM: CHEST - 2 VIEW COMPARISON:  Chest radiograph dated 01/27/2018 FINDINGS: No focal consolidation, pleural effusion, or pneumothorax. There is eventration of the right hemidiaphragm. Stable cardiomegaly and prominence of the aorta. Median sternotomy wires and left atrial appendage occlusive device. IMPRESSION: No active cardiopulmonary disease. Electronically Signed   By: Elgie Collard M.D.   On: 04/26/2018 05:29   Ct Head Wo Contrast  Result Date: 04/28/2018 CLINICAL DATA:  84 year old male with altered mental status. Agitation and confusion. EXAM: CT HEAD WITHOUT CONTRAST TECHNIQUE: Contiguous axial  images were obtained from the base of the skull through the vertex without intravenous contrast. COMPARISON:  Head CT without contrast 04/26/2018 and earlier. FINDINGS: Brain: Stable cerebral volume. Confluent bilateral cerebral white matter hypodensity with bilateral deep gray matter heterogeneity. Stable gray-white matter differentiation throughout the brain. No midline shift, ventriculomegaly, mass effect, evidence of mass lesion, intracranial hemorrhage or evidence of cortically based acute infarction. Vascular: Ectatic right ICA siphon with surgically clipped left ICA. Skull: No acute osseous abnormality identified. Sinuses/Orbits: Chronic right maxillary sinusitis. A small fluid level in the right maxillary sinus has increased.  Otherwise stable sinus pneumatization. Tympanic cavities and mastoids remain clear. Other: No acute orbit or scalp soft tissue findings. IMPRESSION: 1.  No acute intracranial abnormality. 2. Stable non contrast CT appearance of the brain: Advanced chronic small vessel disease and previous surgical clipping of the left ICA siphon. 3. Mild acute on chronic paranasal sinusitis. Electronically Signed   By: Odessa Fleming M.D.   On: 04/28/2018 11:58   Ct Head Wo Contrast  Result Date: 04/26/2018 CLINICAL DATA:  84 year old male with altered mental status. EXAM: CT HEAD WITHOUT CONTRAST TECHNIQUE: Contiguous axial images were obtained from the base of the skull through the vertex without intravenous contrast. COMPARISON:  Head CT dated 12/18/2015 FINDINGS: Brain: There is moderate age-related atrophy and chronic microvascular ischemic changes. There is no acute intracranial hemorrhage. No mass effect or midline shift. No extra-axial fluid collection. Vascular: Vascular clip in the region of the left supraclinoid ICA. High attenuating appearance of the remainder of the cerebral vasculature, likely related to hemoconcentration/dehydration. Skull: Postsurgical changes of the left frontal calvarium. No acute calvarial pathology. Sinuses/Orbits: Diffuse mucoperiosteal thickening of paranasal sinuses with complete opacification of the right maxillary sinus similar to prior CT. No air-fluid level. The mastoid air cells are clear. Other: None IMPRESSION: 1. No acute intracranial pathology. 2. Age-related atrophy and chronic microvascular ischemic changes. 3. Left supraclinoid ICA surgical clips. 4. Chronic paranasal sinus disease. Electronically Signed   By: Elgie Collard M.D.   On: 04/26/2018 05:12   US Renal  Result Date: 04/29/2018 CLINICAL DATA:  Acute kidney injury EXAM: RENAL / URINARY TRACT ULTRASOUND COMPLETE COMPARISON:  None. FINDINGS: Right Kidney: Length: 10.1 cm. Limited visualization with ultrasound because of  body habitus. Normal echogenicity. No hydronephrosis. Left Kidney: Length: 10.3 cm. Normal echogenicity. Minor pelviectasis, nonspecific. No significant hydronephrosis. No definite focal abnormality. Bladder: Appears normal for degree of bladder distention. IMPRESSION: No significant or acute finding by ultrasound. Minor left kidney pelviectasis. Electronically Signed   By: Judie Petit.  Shick M.D.   On: 04/29/2018 15:23   US Renal  Result Date: 04/26/2018 CLINICAL DATA:  Acute renal failure EXAM: RENAL / URINARY TRACT ULTRASOUND COMPLETE COMPARISON:  None. FINDINGS: Right Kidney: Length: 6.8 cm. Echogenicity within normal limits. No mass or hydronephrosis visualized. Left Kidney: Not well visualized Bladder: Appears normal for degree of bladder distention. IMPRESSION: Somewhat limited exam due the patient's body habitus. Left kidney is not well visualized. No hydronephrosis on the right is seen. Electronically Signed   By: Alcide Clever M.D.   On: 04/26/2018 10:56   Dg Chest Port 1 View  Result Date: 05/04/2018 CLINICAL DATA:  Shortness of breath EXAM: PORTABLE CHEST 1 VIEW COMPARISON:  Chest x-rays dated 05/03/2017 and 04/27/2018. FINDINGS: Stable cardiomegaly. Overall cardiomediastinal silhouette appears stable. Persistent mild central pulmonary vascular congestion. Persistent bibasilar opacities, likely atelectasis and/or small pleural effusions. IMPRESSION: 1. Cardiomegaly with central pulmonary vascular congestion, suggesting mild CHF/volume overload. 2.  Persistent bibasilar opacities, likely atelectasis and/or small pleural effusions. Electronically Signed   By: Bary Richard M.D.   On: 05/04/2018 10:49   Dg Chest Port 1 View  Result Date: 05/03/2018 CLINICAL DATA:  Hypertension atrial fibrillation EXAM: PORTABLE CHEST 1 VIEW COMPARISON:  04/27/2018, 04/26/2018, 01/27/2018 FINDINGS: Post sternotomy changes with valve replacement. Enlarged cardiomediastinal silhouette, gad rated by rotation. Mild central  vascular congestion. Probable pleural effusions. Bibasilar airspace disease. No pneumothorax. IMPRESSION: 1. Cardiomegaly with vascular congestion 2. Suspected pleural effusions. Hazy bibasilar airspace disease which may reflect atelectasis or pneumonia. Electronically Signed   By: Jasmine Pang M.D.   On: 05/03/2018 19:45   Dg Chest Port 1 View  Result Date: 04/27/2018 CLINICAL DATA:  Acute renal injury. EXAM: PORTABLE CHEST 1 VIEW COMPARISON:  04/26/2018. FINDINGS: Prior median sternotomy and cardiac valve replacement. Left atrial appendage clip. Cardiomegaly with mild pulmonary vascular prominence. Mild bibasilar atelectasis/infiltrates. No pleural effusion or pneumothorax. IMPRESSION: 1. Prior median sternotomy with cardiac valve replacement. Left atrial appendage clip noted. Cardiomegaly with mild pulmonary vascular prominence. 2.  Mild bibasilar atelectasis/infiltrates. Electronically Signed   By: Maisie Fus  Register   On: 04/27/2018 09:02    Microbiology: Recent Results (from the past 240 hour(s))  MRSA PCR Screening     Status: None   Collection Time: 05/04/18  3:08 AM  Result Value Ref Range Status   MRSA by PCR NEGATIVE NEGATIVE Final    Comment:        The GeneXpert MRSA Assay (FDA approved for NASAL specimens only), is one component of a comprehensive MRSA colonization surveillance program. It is not intended to diagnose MRSA infection nor to guide or monitor treatment for MRSA infections. Performed at [facility], [address], [place], [place] [address]      Labs: Basic Metabolic Panel: Recent Labs  Lab 05/03/18 0428  05/05/18 0311 05/06/18 0323 05/07/18 0427 05/08/18 0419 05/09/18 0759  NA 138   < > 140 140 143 143 143  K 4.2   < > 3.7 3.1* 3.8 3.8 3.5  CL 97*   < > 96* 97* 99 100 98  CO2 32   < > 29 30 31  32 34*  GLUCOSE 114*   < > 89 100* 105* 106* 123*  BUN 18   < > 26* 28* 22 18 16   CREATININE 1.88*   < > 2.31* 2.36* 2.12* 1.84* 1.76*    CALCIUM 8.8*   < > 8.8* 8.7* 8.8* 8.9 9.3  MG 2.3  --   --   --   --   --   --   PHOS 3.8  --   --   --   --   --   --    < > = values in this interval not displayed.   Liver Function Tests: Recent Labs  Lab 05/03/18 0428 05/04/18 0333  AST  --  26  ALT  --  18  ALKPHOS  --  65  BILITOT  --  0.8  PROT  --  6.3*  ALBUMIN 3.7 3.4*   No results for input(s): LIPASE, AMYLASE in the last 168 hours. No results for input(s): AMMONIA in the last 168 hours. CBC: Recent Labs  Lab 05/04/18 0333 05/05/18 0311 05/06/18 0323 05/07/18 0427 05/08/18 0419  WBC 6.5 5.9 5.5 4.6 4.9  HGB 11.8* 12.1* 11.5* 11.0* 11.2*  HCT 36.9* 37.6* 35.7* 34.4* 35.9*  MCV 97.4 96.4 96.7 97.7 98.4  PLT 155 178 167 165  167   Cardiac Enzymes: No results for input(s): CKTOTAL, CKMB, CKMBINDEX, TROPONINI in the last 168 hours. BNP: BNP (last 3 results) Recent Labs    01/28/18 0111  BNP 130.2*    ProBNP (last 3 results) No results for input(s): PROBNP in the last 8760 hours.  CBG: No results for input(s): GLUCAP in the last 168 hours.     Signed:  Edsel Petrin  [facility] 05/09/2018, 3:57 PM

## 2018-05-09 NOTE — Progress Notes (Signed)
Assumed care from previous RN. Agree with earlier assessment. Condition stable. Melton Alar, RN

## 2018-05-09 NOTE — Progress Notes (Signed)
Pt refuses nocturnal cpap/bipap.  Pt was encouraged to call should he change his mind.

## 2018-05-09 NOTE — Progress Notes (Signed)
Pt has approved authorization to admit to [facility] per facility. Will assist with transition there at [place].   Ilean Skill, MSW, LCSW Clinical Social Work 05/09/2018 [phone]

## 2018-05-09 NOTE — Plan of Care (Signed)
  Problem: Health Behavior/Discharge Planning: Goal: Ability to manage health-related needs will improve Outcome: Progressing   Problem: Clinical Measurements: Goal: Ability to maintain clinical measurements within normal limits will improve Outcome: Progressing Goal: Will remain free from infection Outcome: Progressing Goal: Diagnostic test results will improve Outcome: Progressing Goal: Respiratory complications will improve Outcome: Progressing Goal: Cardiovascular complication will be avoided Outcome: Progressing   Problem: Nutrition: Goal: Adequate nutrition will be maintained Outcome: Progressing   Problem: Elimination: Goal: Will not experience complications related to urinary retention Outcome: Progressing   Problem: Safety: Goal: Ability to remain free from injury will improve Outcome: Progressing   Problem: Skin Integrity: Goal: Risk for impaired skin integrity will decrease Outcome: Progressing   

## 2018-05-09 NOTE — Progress Notes (Signed)
Occupational Therapy Treatment Patient Details Name: [name] [name] MRN: [id number] DOB: 1932/01/02 Today's Date: 05/09/2018    History of present illness 84 yo male admitted with AMS, UTI. Hx of HTN, CKD, COPD, syncope, frequent falls, CAD, CHF   OT comments  Pt agreeable to working with OT. Stood but did not step to sink for safety.  Could not tolerate sitting EOB for more than 30 seconds:  Performed 3xs. Declined grooming due to fatique  Follow Up Recommendations  SNF    Equipment Recommendations  3 in 1 bedside commode    Recommendations for Other Services      Precautions / Restrictions Precautions Precautions: Fall Restrictions Weight Bearing Restrictions: No       Mobility Bed Mobility         Supine to sit: Min assist;Mod assist Sit to supine: Min assist;Mod assist   General bed mobility comments: assist for trunk with HOB up to get OOB and assist for each leg for back to bed.  Pt did assist with scooting up in bed mod +2 assist  Transfers   Equipment used: Rolling walker (2 wheeled)   Sit to Stand: Mod assist;+2 physical assistance;+2 safety/equipment         General transfer comment: sidestepped 3 steps up [facility]    Balance                                           ADL either performed or assessed with clinical judgement   ADL                                         General ADL Comments: when OT arrived, pt was naked and had pulled off tele leads.  Max A to don new gown.  Sat EOB but pt could not tolerate sitting long. Stood as he felt catheter wasn't draining. When he returned to supine, he said he was too fatiqued to brush his teeth     Vision       Perception     Praxis      Cognition Arousal/Alertness: Awake/alert Behavior During Therapy: WFL for tasks assessed/performed Overall Cognitive Status: Impaired/Different from baseline                                 General Comments:  decreased safety:  pt sat/initiated lying without warning.  Followed one step commands with extra time        Exercises     Shoulder Instructions       General Comments      Pertinent Vitals/ Pain       Pain Assessment: No/denies pain  Home Living                                          Prior Functioning/Environment              Frequency  Min 2X/week        Progress Toward Goals  OT Goals(current goals can now be found in the care plan section)  Progress towards OT goals: Progressing toward goals  Acute Rehab OT Goals Time For  Goal Achievement: 05/19/18  Plan      Co-evaluation                 AM-PAC PT "6 Clicks" Daily Activity     Outcome Measure   Help from another person eating meals?: A Little Help from another person taking care of personal grooming?: A Lot Help from another person toileting, which includes using toliet, bedpan, or urinal?: A Lot Help from another person bathing (including washing, rinsing, drying)?: A Lot Help from another person to put on and taking off regular upper body clothing?: A Lot Help from another person to put on and taking off regular lower body clothing?: Total 6 Click Score: 12    End of Session    OT Visit Diagnosis: Unsteadiness on feet (R26.81);Muscle weakness (generalized) (M62.81)   Activity Tolerance Patient limited by fatigue   Patient Left in bed;with call bell/phone within reach;with bed alarm set   Nurse Communication          Time: 1761-6073 OT Time Calculation (min): 19 min  Charges: OT General Charges $OT Visit: 1 Visit OT Treatments $Therapeutic Activity: 8-22 mins  Marica Otter, OTR/L [phone] 05/09/2018   Rodrigo Mcgranahan 05/09/2018, 9:56 AM

## 2018-05-09 NOTE — Progress Notes (Signed)
Notified MD of pt voiding and bladder scan amount post voiding. To write discharge orders. RN attempted to call SW and left voicemail. Melton Alar, RN

## 2018-05-09 NOTE — Progress Notes (Signed)
ANTICOAGULATION CONSULT NOTE - Follow Up Consult  Pharmacy Consult for heparin --> Eliquis Indication:hx atrial fibrillation and DVT  Allergies  Allergen Reactions  . Haldol [Haloperidol Lactate]     Over-sedation and at times agitation     Patient Measurements: Height: 6' (182.9 cm) Weight: (!) 309 lb 1.4 oz (140.2 kg) IBW/kg (Calculated) : 77.6 Heparin Dosing Weight: 110 kg  Vital Signs: Temp: 97.7 F (36.5 C) (09/10 0510) Temp Source: Oral (09/10 0510) BP: 132/89 (09/10 0510) Pulse Rate: 93 (09/10 0510)  Labs: Recent Labs    05/07/18 0427 05/08/18 0419 05/09/18 0759  HGB 11.0* 11.2*  --   HCT 34.4* 35.9*  --   PLT 165 167  --   CREATININE 2.12* 1.84* 1.76*    Estimated Creatinine Clearance: 45.3 mL/min (A) (by C-G formula based on SCr of 1.76 mg/dL (H)).   Medications:  - on Eliquis 2.5 mg bid PTA  Assessment: Patient's an 84 y.o M with hx CKD, afib, and DVT on Eliquis PTA, presented to the ED on 04/26/18 with AMS.  Home Eliquis was resumed on admission.  Patient was lethargic with hypercarbic respiratory failure on 9/4 and subsequently placed on BiPAP.  With PO meds held, he was transitioned to heparin drip on 9/4. Anticoagulation was then transitioned back to Eliquis on 05/06/18.  Today, 05/09/2018: - scr 1.79 (baseline appears to be 1.6-1.7) - cbc stable - no bleeding documented   Plan:  - continue home Eliquis regimen of 2.5 mg bid - Current dose is appropriate for age (>56 y.o) and scr (>1.5) - pharmacy will sign off for Eliquis but will follow patient peripherally.  Re-consult [place] if need further assistance  Levin Dagostino P 05/09/2018,1:10 PM

## 2018-05-09 NOTE — Progress Notes (Signed)
Notified MD will try to contact evening SW to see if discharge can be arranged. Evening SW to contact facility and let me know if discharge possible tonight. Melton Alar, RN

## 2018-05-09 NOTE — Progress Notes (Signed)
[name] ED CSW received phone call from pt's RN. Pt ready for discharge. CSW left voicemail for [facility] admissions coordinator. CSW called [employer] [employer] [facility], pt not on nurse's active pt list. Per [facility] [employer] nurse, pt will have to wait until tomorrow to come when admissions coordinator is there.  Montine Circle, [facility]  [phone]

## 2018-05-09 NOTE — Discharge Instructions (Signed)
Acute Respiratory Failure, Adult °Acute respiratory failure occurs when there is not enough oxygen passing from your lungs to your body. When this happens, your lungs have trouble removing carbon dioxide from the blood. This causes your blood oxygen level to drop too low as carbon dioxide builds up. °Acute respiratory failure is a medical emergency. It can develop quickly, but it is temporary if treated promptly. Your lung capacity, or how much air your lungs can hold, may improve with time, exercise, and treatment. °What are the causes? °There are many possible causes of acute respiratory failure, including: °· Lung injury. °· Chest injury or damage to the ribs or tissues near the lungs. °· Lung conditions that affect the flow of air and blood into and out of the lungs, such as pneumonia, acute respiratory distress syndrome, and cystic fibrosis. °· Medical conditions, such as strokes or spinal cord injuries, that affect the muscles and nerves that control breathing. °· Blood infection (sepsis). °· Inflammation of the pancreas (pancreatitis). °· A blood clot in the lungs (pulmonary embolism). °· A large-volume blood transfusion. °· Burns. °· Near-drowning. °· Seizure. °· Smoke inhalation. °· Reaction to medicines. °· Alcohol or drug overdose. ° °What increases the risk? °This condition is more likely to develop in people who have: °· A blocked airway. °· Asthma. °· A condition or disease that damages or weakens the muscles, nerves, bones, or tissues that are involved in breathing. °· A serious infection. °· A health problem that blocks the unconscious reflex that is involved in breathing, such as hypothyroidism or sleep apnea. °· A lung injury or trauma. ° °What are the signs or symptoms? °Trouble breathing is the main symptom of acute respiratory failure. Symptoms may also include: °· Rapid breathing. °· Restlessness or anxiety. °· Skin, lips, or fingernails that appear blue (cyanosis). °· Rapid heart  rate. °· Abnormal heart rhythms (arrhythmias). °· Confusion or changes in behavior. °· Tiredness or loss of energy. °· Feeling sleepy or having a loss of consciousness. ° °How is this diagnosed? °Your health care provider can diagnose acute respiratory failure with a medical history and physical exam. During the exam, your health care provider will listen to your heart and check for crackling or wheezing sounds in your lungs. Your may also have tests to confirm the diagnosis and determine what is causing respiratory failure. These tests may include: °· Measuring the amount of oxygen in your blood (pulse oximetry). The measurement comes from a small device that is placed on your finger, earlobe, or toe. °· Other blood tests to measure blood gases and to look for signs of infection. °· Sampling your cerebral spinal fluid or tracheal fluid to check for infections. °· Chest X-ray to look for fluid in spaces that should be filled with air. °· Electrocardiogram (ECG) to look at the heart's electrical activity. ° °How is this treated? °Treatment for this condition usually takes places in a hospital intensive care unit (ICU). Treatment depends on what is causing the condition. It may include one or more treatments until your symptoms improve. Treatment may include: °· Supplemental oxygen. Extra oxygen is given through a tube in the nose, a face mask, or a hood. °· A device such as a continuous positive airway pressure (CPAP) or bi-level positive airway pressure (BiPAP or BPAP) machine. This treatment uses mild air pressure to keep the airways open. A mask or other device will be placed over your nose or mouth. A tube that is connected to a motor will deliver   oxygen through the mask.  Ventilator. This treatment helps move air into and out of the lungs. This may be done with a bag and mask or a machine. For this treatment, a tube is placed in your windpipe (trachea) so air and oxygen can flow to the lungs.  Extracorporeal  membrane oxygenation (ECMO). This treatment temporarily takes over the function of the heart and lungs, supplying oxygen and removing carbon dioxide. ECMO gives the lungs a chance to recover. It may be used if a ventilator is not effective.  Tracheostomy. This is a procedure that creates a hole in the neck to insert a breathing tube.  Receiving fluids and medicines.  Rocking the bed to help breathing.  Follow these instructions at home:  Take over-the-counter and prescription medicines only as told by your health care provider.  Return to normal activities as told by your health care provider. Ask your health care provider what activities are safe for you.  Keep all follow-up visits as told by your health care provider. This is important. How is this prevented? Treating infections and medical conditions that may lead to acute respiratory failure can help prevent the condition from developing. Contact a health care provider if:  You have a fever.  Your symptoms do not improve or they get worse. Get help right away if:  You are having trouble breathing.  You lose consciousness.  Your have cyanosis or turn blue.  You develop a rapid heart rate.  You are confused. These symptoms may represent a serious problem that is an emergency. Do not wait to see if the symptoms will go away. Get medical help right away. Call your local emergency services (911 in the [place] Do not drive yourself to the hospital. This information is not intended to replace advice given to you by your health care provider. Make sure you discuss any questions you have with your health care provider. Document Released: 08/21/2013 Document Revised: 03/13/2016 Document Reviewed: 03/03/2016 Elsevier Interactive Patient Education  2018 [employer].  Confusion Confusion is the inability to think with your usual speed or clarity. Confusion may come on quickly or slowly over time. How quickly the confusion comes on  depends on the cause. Confusion can be due to any number of causes. What are the causes?  Concussion, head injury, or head trauma.  Seizures.  Stroke.  Fever.  Brain tumor.  Age related decreased brain function (dementia).  Heightened emotional states like rage or terror.  Mental illness in which the person loses the ability to determine what is real and what is not (hallucinations).  Infections such as a urinary tract infection (UTI).  Toxic effects from alcohol, drugs, or prescription medicines.  Dehydration and an imbalance of salts in the body (electrolytes).  Lack of sleep.  Low blood sugar (diabetes).  Low levels of oxygen from conditions such as chronic lung disorders.  Drug interactions or other medicine side effects.  Nutritional deficiencies, especially niacin, thiamine, vitamin C, or vitamin B.  Sudden drop in body temperature (hypothermia).  Change in routine, such as when traveling or hospitalized. What are the signs or symptoms? People often describe their thinking as cloudy or unclear when they are confused. Confusion can also include feeling disoriented. That means you are unaware of where or who you are. You may also not know what the date or time is. If confused, you may also have difficulty paying attention, remembering, and making decisions. Some people also act aggressively when they are confused. How is  this diagnosed? The medical evaluation of confusion may include:  Blood and urine tests.  X-rays.  Brain and nervous system tests.  Analyzing your brain waves (electroencephalogram or EEG).  Magnetic resonance imaging (MRI) of your head.  Computed tomography (CT) scan of your head.  Mental status tests in which your health care provider may ask many questions. Some of these questions may seem silly or strange, but they are a very important test to help diagnose and treat confusion.  How is this treated? An admission to the hospital may  not be needed, but a person with confusion should not be left alone. Stay with a family member or friend until the confusion clears. Avoid alcohol, pain relievers, or sedative drugs until you have fully recovered. Do not drive until directed by your health care provider. Follow these instructions at home: What family and friends can do:  To find out if someone is confused, ask the person to state his or her name, age, and the date. If the person is unsure or answers incorrectly, he or she is confused.  Always introduce yourself, no matter how well the person knows you.  Often remind the person of his or her location.  Place a calendar and clock near the confused person.  Help the person with his or her medicines. You may want to use a pill box, an alarm as a reminder, or give the person each dose as prescribed.  Talk about current events and plans for the day.  Try to keep the environment calm, quiet, and peaceful.  Make sure the person keeps follow-up visits with his or her health care provider.  How is this prevented? Ways to prevent confusion:  Avoid alcohol.  Eat a balanced diet.  Get enough sleep.  Take medicine only as directed by your health care provider.  Do not become isolated. Spend time with other people and make plans for your days.  Keep careful watch on your blood sugar levels if you are diabetic.  Get help right away if:  You develop severe headaches, repeated vomiting, seizures, blackouts, or slurred speech.  There is increasing confusion, weakness, numbness, restlessness, or personality changes.  You develop a loss of balance, have marked dizziness, feel uncoordinated, or fall.  You have delusions, hallucinations, or develop severe anxiety.  Your family members think you need to be rechecked. This information is not intended to replace advice given to you by your health care provider. Make sure you discuss any questions you have with your health care  provider. Document Released: 09/23/2004 Document Revised: 03/05/2016 Document Reviewed: 09/21/2013 Elsevier Interactive Patient Education  [employer].

## 2018-05-10 NOTE — Progress Notes (Signed)
Physical Therapy Treatment Patient Details Name: [name] [name] MRN: [id number] DOB: 08-20-1932 Today's Date: 05/10/2018    History of Present Illness 84 yo male admitted with AMS, UTI. Hx of HTN, CKD, COPD, syncope, frequent falls, CAD, CHF    PT Comments    Pt assisted with standing and ambulated short distance with recliner following for safety.  Pt to d/c to SNF today.   Follow Up Recommendations  SNF     Equipment Recommendations  None recommended by PT    Recommendations for Other Services       Precautions / Restrictions Precautions Precautions: Fall Precaution Comments: O2 dep, not on oxygen on arrival to room    Mobility  Bed Mobility Overal bed mobility: Needs Assistance Bed Mobility: Supine to Sit     Supine to sit: Min assist     General bed mobility comments: assist for trunk upright, increased time  Transfers Overall transfer level: Needs assistance Equipment used: Rolling walker (2 wheeled) Transfers: Sit to/from Stand Sit to Stand: Mod assist;+2 safety/equipment         General transfer comment: verbal cues for safe technique  Ambulation/Gait Ambulation/Gait assistance: Min assist;+2 safety/equipment Gait Distance (Feet): 11 Feet Assistive device: Rolling walker (2 wheeled) Gait Pattern/deviations: Step-through pattern;Decreased stride length;Trunk flexed     General Gait Details: verbal cues for safe use of RW, fatigued quickly, recliner following, SPO2 93% on room air; pt reports increased weakness   Stairs             Wheelchair Mobility    Modified Rankin (Stroke Patients Only)       Balance                                            Cognition Arousal/Alertness: Awake/alert Behavior During Therapy: WFL for tasks assessed/performed Overall Cognitive Status: Impaired/Different from baseline Area of Impairment: Problem solving                             Problem Solving: Slow  processing;Requires verbal cues General Comments: decreased safety      Exercises      General Comments        Pertinent Vitals/Pain Pain Assessment: No/denies pain    Home Living                      Prior Function            PT Goals (current goals can now be found in the care plan section) Progress towards PT goals: Progressing toward goals    Frequency    Min 2X/week      PT Plan Current plan remains appropriate    Co-evaluation              AM-PAC PT "6 Clicks" Daily Activity  Outcome Measure  Difficulty turning over in bed (including adjusting bedclothes, sheets and blankets)?: A Lot Difficulty moving from lying on back to sitting on the side of the bed? : Unable Difficulty sitting down on and standing up from a chair with arms (e.g., wheelchair, bedside commode, etc,.)?: Unable Help needed moving to and from a bed to chair (including a wheelchair)?: A Lot Help needed walking in hospital room?: A Lot Help needed climbing 3-5 steps with a railing? : Total 6 Click Score: 9  End of Session Equipment Utilized During Treatment: Gait belt Activity Tolerance: Patient limited by fatigue Patient left: in chair;with chair alarm set;with call bell/phone within reach Nurse Communication: Mobility status PT Visit Diagnosis: Muscle weakness (generalized) (M62.81);Difficulty in walking, not elsewhere classified (R26.2)     Time: 1610-9604 PT Time Calculation (min) (ACUTE ONLY): 12 min  Charges:  $Gait Training: 8-22 mins                    Zenovia Jarred, PT, DPT Acute Rehabilitation Services Office: [phone] Pager: [phone]  Sarajane Jews 05/10/2018, 3:49 PM

## 2018-05-10 NOTE — Progress Notes (Signed)
Pt admitting to [facility]- report [phone] Son completed paperwork for admission at facility this morning Provided DC information via the [facility] transportation arranged.  Ilean Skill, MSW, LCSW Clinical Social Work 05/10/2018 [phone]

## 2018-05-10 NOTE — Progress Notes (Signed)
Patient ID: [name] [name], male   DOB: 1932/07/18, 84 y.o.   MRN: [id number] Patient was supposed to be discharged on 05/09/2018 to SNF but no bed was available.  Patient will be discharged today on 05/10/2018 to SNF.  Please refer the full discharge summary done on 05/09/2018 by Dr. Chesley Noon for full details.  Patient seen and examined at bedside.  Patient is medically stable for discharge.

## 2018-05-10 NOTE — Progress Notes (Signed)
Report called to Gabriel Rung, Charity fundraiser at [employer].

## 2018-05-11 ENCOUNTER — Encounter: Payer: Self-pay | Admitting: Internal Medicine

## 2018-05-11 ENCOUNTER — Non-Acute Institutional Stay (SKILLED_NURSING_FACILITY): Payer: Medicare Other | Admitting: Internal Medicine

## 2018-05-11 ENCOUNTER — Ambulatory Visit: Payer: Self-pay | Admitting: Cardiovascular Disease

## 2018-05-11 DIAGNOSIS — J9602 Acute respiratory failure with hypercapnia: Secondary | ICD-10-CM

## 2018-05-11 DIAGNOSIS — I5033 Acute on chronic diastolic (congestive) heart failure: Secondary | ICD-10-CM

## 2018-05-11 DIAGNOSIS — G301 Alzheimer's disease with late onset: Secondary | ICD-10-CM

## 2018-05-11 DIAGNOSIS — K219 Gastro-esophageal reflux disease without esophagitis: Secondary | ICD-10-CM

## 2018-05-11 DIAGNOSIS — J9601 Acute respiratory failure with hypoxia: Secondary | ICD-10-CM

## 2018-05-11 DIAGNOSIS — F39 Unspecified mood [affective] disorder: Secondary | ICD-10-CM

## 2018-05-11 DIAGNOSIS — G9341 Metabolic encephalopathy: Secondary | ICD-10-CM

## 2018-05-11 DIAGNOSIS — I10 Essential (primary) hypertension: Secondary | ICD-10-CM

## 2018-05-11 DIAGNOSIS — I480 Paroxysmal atrial fibrillation: Secondary | ICD-10-CM

## 2018-05-11 DIAGNOSIS — Z952 Presence of prosthetic heart valve: Secondary | ICD-10-CM

## 2018-05-11 DIAGNOSIS — E038 Other specified hypothyroidism: Secondary | ICD-10-CM | POA: Diagnosis not present

## 2018-05-11 DIAGNOSIS — F02818 Dementia in other diseases classified elsewhere, unspecified severity, with other behavioral disturbance: Secondary | ICD-10-CM

## 2018-05-11 DIAGNOSIS — F0281 Dementia in other diseases classified elsewhere with behavioral disturbance: Secondary | ICD-10-CM

## 2018-05-11 DIAGNOSIS — R3911 Hesitancy of micturition: Secondary | ICD-10-CM

## 2018-05-11 DIAGNOSIS — E039 Hypothyroidism, unspecified: Secondary | ICD-10-CM

## 2018-05-11 DIAGNOSIS — G4733 Obstructive sleep apnea (adult) (pediatric): Secondary | ICD-10-CM

## 2018-05-11 DIAGNOSIS — N401 Enlarged prostate with lower urinary tract symptoms: Secondary | ICD-10-CM

## 2018-05-11 DIAGNOSIS — Z86718 Personal history of other venous thrombosis and embolism: Secondary | ICD-10-CM

## 2018-05-11 DIAGNOSIS — E785 Hyperlipidemia, unspecified: Secondary | ICD-10-CM

## 2018-05-11 DIAGNOSIS — F419 Anxiety disorder, unspecified: Secondary | ICD-10-CM

## 2018-05-11 NOTE — Progress Notes (Addendum)
:  Location:  [facility] Room Number: 110P Place of Service:  SNF (31)  [name] [name] Coil, MD  Patient Care Team: Aura Dials, MD as PCP - General (Family Medicine) Nahser, [name], MD as PCP - Cardiology (Cardiology) Nahser, [name], MD as Consulting Physician (Cardiology) [name], MD as Referring Physician (Urology)  Extended Emergency Contact Information Primary Emergency Contact: [name],[name] Address: [address]          [place], [place] [address] [name] of [place] Phone: [phone] Mobile Phone: [phone] Relation: Son Secondary Emergency Contact: [name],[name] Address: [place], [place] Phone: [phone] Relation: Spouse     Allergies: Haldol [haloperidol lactate]  Chief Complaint  Patient presents with  . New Admit To SNF    Admit to [facility]    HPI: Patient is 84 y.o. male with hypertension, hyperlipidemia, paroxysmal atrial fib, aortic insufficiency, chronic kidney disease stage III, history of DVT, COPD, OSA who was reported to have left his house around noon and is up and did not, [place].  His son filed a missing person report and a syllable or alert was activated.  Patient seemed more confused than usual, and that is all the history known.  Patient was admitted to [facility] from 8/20 8-9/10 where he was treated for metabolic encephalopathy, acute respiratory failure with hypoxia and hypercapnia secondary to hypervolemia and severe hypothyroidism.  Hospital course was further complicated by acute on chronic kidney disease stage III.  Patient improved and is admitted to skilled nursing facility for OT/PT.  While at skilled nursing facility patient will be followed for hypertension treated with Coreg 25 mg twice daily, 6 as needed, hyperlipidemia treated with Lipitor and BPH treated with Avodart.  Past Medical History:  Diagnosis  Date  . Anxiety   . Aortic valve insufficiency   . Arthritis    "wee bit; knees, elbows" (10/15/2014)  . Atrial fibrillation ([place])   . Cardiomyopathy ([place])   . Chronic kidney disease    "down to ~ 1/2 of their regular use; I see kidney dr. in [place]" (10/15/2014)  . Complication of anesthesia    unsure of complications but pt. states there were complications  . COPD (chronic obstructive pulmonary disease) ([place])    on home o2  . Coronary artery disease   . Decreased testosterone level   . Depression   . Difficult intubation    difficult intubation 12/03/09 and 03/27/10 ([facility]); glidescope used 03/27/10  . DVT (deep venous thrombosis) ([place]) ~ 2013   "BLE"  . Dysrhythmia    A. Fib  . Emphysema of lung ([place])   . Enlarged prostate   . Falls frequently    > 20 times in the last year/notes 10/15/2014  . GERD (gastroesophageal reflux disease)   . Heart murmur   . History of blood transfusion 1960   "lots; related to an accident"  . History of echocardiogram    post AVR >> Echo 7/16:  Mild LVH, EF 20-25%, AVR ok (peak 15 mmHg, mean 9 mmHg), MAC, mild MR, severe LAE, mild reduced RVSF, mild RAE, PASP 35 mmHg  . Hypercholesteremia   . Hypertension   . Hypothyroidism   . OSA (obstructive sleep apnea)    "suppose to wear mask; I throw it off in my sleep" (10/15/2014)  . Plantar fasciitis   . Right fibular fracture 10/15/2014  .  Seasonal allergies   . Shortness of breath dyspnea   . Syncope 10/15/2014  . Syncope and collapse "several times"  . Urine frequency   . Urine incontinence   . Varicose veins     Past Surgical History:  Procedure Laterality Date  . ABDOMINAL AORTIC ANEURYSM REPAIR  01/2006; 03/10/2006   Archie Endo 01/12/2011  . AORTIC VALVE REPLACEMENT N/A 02/11/2015   Procedure: AORTIC VALVE REPLACEMENT (AVR);  Surgeon: Grace Isaac, MD;  Location: [facility];  Service: Open Heart Surgery;  Laterality: N/A;  . BRAIN SURGERY  1960 X 4   "S/P got my skull busted"; pt. states removed  internal carotid artery  . CARDIAC CATHETERIZATION  1990's X 1; 09/2014   no stents  . CLIPPING OF ATRIAL APPENDAGE N/A 02/11/2015   Procedure: CLIPPING OF ATRIAL APPENDAGE;  Surgeon: Grace Isaac, MD;  Location: [facility];  Service: Open Heart Surgery;  Laterality: N/A;  . ERCP  11/2009   Archie Endo 12/05/2009  . gall stone    . HERNIA REPAIR    . LAPAROSCOPIC CHOLECYSTECTOMY  08/2009   w/IOC/notes 09/18/2009  . LAPAROSCOPIC INCISIONAL / UMBILICAL / VENTRAL HERNIA REPAIR  02/2010   VHR w/mesh/notes 03/28/2010  . NOSE SURGERY    . TEE WITHOUT CARDIOVERSION N/A 02/11/2015   Procedure: TRANSESOPHAGEAL ECHOCARDIOGRAM (TEE);  Surgeon: Grace Isaac, MD;  Location: [facility];  Service: Open Heart Surgery;  Laterality: N/A;  . TONSILLECTOMY      Allergies as of 05/11/2018      Reactions   Haldol [haloperidol Lactate]    Over-sedation and at times agitation       Medication List        Accurate as of 05/11/18  9:53 AM. Always use your most recent med list.          apixaban 2.5 MG Tabs tablet Commonly known as:  ELIQUIS Take 1 tablet (2.5 mg total) by mouth 2 (two) times daily.   atorvastatin 10 MG tablet Commonly known as:  LIPITOR Take 1 tablet (10 mg total) by mouth daily.   carvedilol 25 MG tablet Commonly known as:  COREG Take 25 mg by mouth 2 (two) times daily with a meal.   dutasteride 0.5 MG capsule Commonly known as:  AVODART Take 1 capsule (0.5 mg total) by mouth daily.   folic acid 1 MG tablet Commonly known as:  FOLVITE Take 1 tablet (1 mg total) by mouth daily.   furosemide 20 MG tablet Commonly known as:  LASIX Take 1 tablet (20 mg total) by mouth daily as needed for fluid. Only if you gain > 3 lb in 24 hrs   ipratropium-albuterol 0.5-2.5 (3) MG/3ML Soln Commonly known as:  DUONEB Take 3 mLs by nebulization every 4 (four) hours as needed (for wheezing associated with shortness of breath).   levothyroxine 200 MCG tablet Commonly known as:  SYNTHROID,  LEVOTHROID Take 1 tablet (200 mcg total) by mouth daily before breakfast.   mirtazapine 7.5 MG tablet Commonly known as:  REMERON Take 15 mg by mouth at bedtime.   omeprazole 20 MG capsule Commonly known as:  PRILOSEC Take 1 capsule (20 mg total) by mouth daily.   polyethylene glycol packet Commonly known as:  MIRALAX / GLYCOLAX Take 17 g by mouth daily as needed for mild constipation.   potassium chloride 10 MEQ tablet Commonly known as:  K-DUR TAKE 1 TABLET BY MOUTH EVERY DAY. KEEP UPCOMING APPOINTMENT IN JULY FOR FUTURE REFILLS.   senna 8.6 MG  Tabs tablet Commonly known as:  SENOKOT Take 1 tablet (8.6 mg total) by mouth 2 (two) times daily.   Tiotropium Bromide-Olodaterol 2.5-2.5 MCG/ACT Aers Inhale 2 puffs into the lungs daily.       No orders of the defined types were placed in this encounter.   Immunization History  Administered Date(s) Administered  . Influenza, High Dose Seasonal PF 05/29/2013, 06/09/2016, 05/05/2018  . Influenza-Unspecified 04/30/2014, 08/15/2014  . Pneumococcal Conjugate-13 08/15/2014  . Pneumococcal Polysaccharide-23 04/29/2010    Social History   Tobacco Use  . Smoking status: Former Smoker    Packs/day: 2.00    Years: 22.00    Pack years: 44.00    Types: Cigarettes    Last attempt to quit: 04/29/1974    Years since quitting: 44.0  . Smokeless tobacco: Never Used  Substance Use Topics  . Alcohol use: Yes    Alcohol/week: 0.0 standard drinks    Comment: 1 beer per year     Family history is   Family History  Problem Relation Age of Onset  . Rheum arthritis Mother   . Diabetes Mother   . Heart disease Father   . Rheum arthritis Father   . Kidney disease Father   . Kidney failure Brother   . Breast cancer Sister   . Other Sister        Murdered  . Depression Sister   . Cancer Sister   . Stroke Sister   . Colon cancer Neg Hx   . Colon polyps Neg Hx   . Liver disease Neg Hx       Review of Systems  DATA OBTAINED:  from patient, nurse-course was extremely agitated over the weekend GENERAL:  no fevers, fatigue, appetite changes SKIN: No itching, or rash EYES: No eye pain, redness, discharge EARS: No earache, tinnitus, change in hearing NOSE: No congestion, drainage or bleeding  MOUTH/THROAT: No mouth or tooth pain, No sore throat RESPIRATORY: No cough, wheezing, SOB CARDIAC: No chest pain, palpitations, lower extremity edema  GI: No abdominal pain, No N/V/D or constipation, No heartburn or reflux  GU: No dysuria, frequency or urgency, or incontinence  MUSCULOSKELETAL: No unrelieved bone/joint pain NEUROLOGIC: No headache, dizziness or focal weakness PSYCHIATRIC: No c/o anxiety or sadness   Vitals:   05/11/18 0948  BP: (!) 154/100  Pulse: 90  Resp: 20  Temp: (!) 97 F (36.1 C)    SpO2 Readings from Last 1 Encounters:  05/10/18 99%   Body mass index is 43.62 kg/m.     Physical Exam  GENERAL APPEARANCE: Alert, conversant,  No acute distress.  SKIN: No diaphoresis rash HEAD: Normocephalic, atraumatic  EYES: Conjunctiva/lids clear. Pupils round, reactive. EOMs intact.  EARS: External exam WNL, canals clear. Hearing grossly normal.  NOSE: No deformity or discharge.  MOUTH/THROAT: Lips w/o lesions  RESPIRATORY: Breathing is even, unlabored. Lung sounds are clear   CARDIOVASCULAR: Heart irreg no murmurs, rubs or gallops. No peripheral edema.   GASTROINTESTINAL: Abdomen is soft, non-tender, not distended w/ normal bowel sounds. GENITOURINARY: Bladder non tender, not distended  MUSCULOSKELETAL: No abnormal joints or musculature NEUROLOGIC:  Cranial nerves 2-12 grossly intact. Moves all extremities  PSYCHIATRIC: Mood and affect appropriate to situation dementia, no behavioral issues  Patient Active Problem List   Diagnosis Date Noted  . Bradycardia   . Permanent atrial fibrillation ([place])   . S/P AVR (aortic valve replacement)   . Respiratory failure ([place])   . Encounter for palliative  care   . Goals  of care, counseling/discussion   . Altered mental status 04/26/2018  . Acute renal failure (ARF) ([place]) 04/26/2018  . Lower urinary tract infectious disease 04/26/2018  . Anemia 04/26/2018  . CKD (chronic kidney disease) stage 3, GFR 30-59 ml/min (H[facility]) 01/29/2018  . Symptomatic hypotension 01/27/2018  . COPD with acute exacerbation ([place]) 01/27/2018  . Cellulitis 01/05/2018  . Pre-diabetes 01/07/2017  . COPD (chronic obstructive pulmonary disease) with chronic bronchitis ([place]) 12/08/2016  . Elevated hemidiaphragm 12/08/2016  . Enlarged thoracic aorta ([place]) 12/03/2016  . Chronic diastolic heart failure ([place]) 12/03/2016  . Pulmonary nodule 12/03/2016  . Orthostatic hypotension 11/08/2015  . S/P AVR 02/11/2015  . Ataxia 12/05/2014  . Abnormality of gait 12/05/2014  . Neck pain, chronic 12/05/2014  . Abnormal carotid duplex scan 10/28/2014  . Obstructive sleep apnea 10/28/2014  . Mood disorder with psychosis ([place]) 10/28/2014  . Episodes of formed visual hallucinations 10/16/2014  . CAD (coronary artery disease), native coronary artery 09/29/2014  . Falls frequently 09/29/2014  . Essential hypertension   . Hyperlipidemia, unspecified   . AR (aortic regurgitation)   . Depression   . Paroxysmal A-fib ([place]) 06/20/2014  . Hypothyroidism 06/20/2014  . Exertional dyspnea 04/29/2014  . Obesity (BMI 30-39.9) 08/08/2012  . Anxiety 08/03/2012  . Hydrocele 07/14/2012  . Abdominal aortic aneurysm ([place]) 09/08/2011  . BPH (benign prostatic hyperplasia) 09/08/2011  . GERD (gastroesophageal reflux disease) 09/08/2011      Labs reviewed: Basic Metabolic Panel:    Component Value Date/Time   NA 143 05/09/2018 0759   NA 142 01/21/2017 1453   K 3.5 05/09/2018 0759   CL 98 05/09/2018 0759   CO2 34 (H) 05/09/2018 0759   GLUCOSE 123 (H) 05/09/2018 0759   BUN 16 05/09/2018 0759   BUN 23 01/21/2017 1453   CREATININE 1.76 (H) 05/09/2018 0759   CREATININE 1.69 (H) 06/05/2015  1221   CALCIUM 9.3 05/09/2018 0759   PROT 6.3 (L) 05/04/2018 0333   PROT 7.1 04/08/2015 1428   ALBUMIN 3.4 (L) 05/04/2018 0333   ALBUMIN 4.2 04/08/2015 1428   AST 26 05/04/2018 0333   ALT 18 05/04/2018 0333   ALKPHOS 65 05/04/2018 0333   BILITOT 0.8 05/04/2018 0333   BILITOT 0.2 04/08/2015 1428   GFRNONAA 34 (L) 05/09/2018 0759   GFRNONAA 42 06/03/2016   GFRAA 39 (L) 05/09/2018 0759    Recent Labs    01/28/18 0348  04/27/18 0416  05/01/18 0425 05/02/18 0432 05/03/18 0428  05/07/18 0427 05/08/18 0419 05/09/18 0759  NA 141   < > 141   < > 137 138 138   < > 143 143 143  K 3.9   < > 4.2   < > 3.9 4.1 4.2   < > 3.8 3.8 3.5  CL 106   < > 100   < > 99 98 97*   < > 99 100 98  CO2 26   < > 32   < > 28 32 32   < > 31 32 34*  GLUCOSE 109*   < > 125*   < > 96 105* 114*   < > 105* 106* 123*  BUN 23*   < > 25*   < > _0 < > _1 CREATININE 1.65*   < > 2.34*   < > 1.86* 2.04* 1.88*   < > 2.12* 1.84* 1.76*  CALCIUM 8.3*   < > 9.0   < > 8.3* 8.4* 8.8*   < >  8.8* 8.9 9.3  MG 2.0  --  2.1  --   --   --  2.3  --   --   --   --   PHOS 2.8  --  2.8   < > 3.9 3.6 3.8  --   --   --   --    < > = values in this interval not displayed.   Liver Function Tests: Recent Labs    04/26/18 0439 04/27/18 0416  05/02/18 0432 05/03/18 0428 05/04/18 0333  AST 38 37  --   --   --  26  ALT 22 19  --   --   --  18  ALKPHOS 91 80  --   --   --  65  BILITOT 0.9 0.9  --   --   --  0.8  PROT 7.8 7.1  --   --   --  6.3*  ALBUMIN 4.4 3.9   < > 3.3* 3.7 3.4*   < > = values in this interval not displayed.   No results for input(s): LIPASE, AMYLASE in the last 8760 hours. No results for input(s): AMMONIA in the last 8760 hours. CBC: Recent Labs    01/05/18 1615  04/26/18 0503 04/27/18 0416  05/06/18 0323 05/07/18 0427 05/08/18 0419  WBC 6.5   < > 6.4 6.2   < > 5.5 4.6 4.9  NEUTROABS 4.3  --  3.2 3.4  --   --   --   --   HGB 13.0   < > 12.5* 12.4*   < > 11.5* 11.0* 11.2*  HCT 39.7   < >  40.2 39.4   < > 35.7* 34.4* 35.9*  MCV 93.6   < > 99.3 98.3   < > 96.7 97.7 98.4  PLT 275   < > 181 186   < > 167 165 167   < > = values in this interval not displayed.   Lipid No results for input(s): CHOL, HDL, LDLCALC, TRIG in the last 8760 hours.  Cardiac Enzymes: Recent Labs    01/28/18 0111 01/28/18 0348 01/28/18 0925  TROPONINI <0.03 <0.03 <0.03   BNP: Recent Labs    01/28/18 0111  BNP 130.2*   No results found for: [facility] Lab Results  Component Value Date   HGBA1C 5.7 (H) 01/28/2018   Lab Results  Component Value Date   TSH 138.414 (H) 04/27/2018   Lab Results  Component Value Date   VITAMINB12 318 04/08/2015   Lab Results  Component Value Date   FOLATE >20.0 04/08/2015   No results found for: IRON, TIBC, FERRITIN  Imaging and Procedures obtained prior to SNF admission: Dg Chest 2 View  Result Date: 04/26/2018 CLINICAL DATA:  84 year old male with shortness of breath and altered mental status. EXAM: CHEST - 2 VIEW COMPARISON:  Chest radiograph dated 01/27/2018 FINDINGS: No focal consolidation, pleural effusion, or pneumothorax. There is eventration of the right hemidiaphragm. Stable cardiomegaly and prominence of the aorta. Median sternotomy wires and left atrial appendage occlusive device. IMPRESSION: No active cardiopulmonary disease. Electronically Signed   By: Anner Crete M.D.   On: 04/26/2018 05:29   Ct Head Wo Contrast  Result Date: 04/26/2018 CLINICAL DATA:  84 year old male with altered mental status. EXAM: CT HEAD WITHOUT CONTRAST TECHNIQUE: Contiguous axial images were obtained from the base of the skull through the vertex without intravenous contrast. COMPARISON:  Head CT dated 12/18/2015 FINDINGS: Brain: There is moderate age-related atrophy and  chronic microvascular ischemic changes. There is no acute intracranial hemorrhage. No mass effect or midline shift. No extra-axial fluid collection. Vascular: Vascular clip in the region of the left  supraclinoid ICA. High attenuating appearance of the remainder of the cerebral vasculature, likely related to hemoconcentration/dehydration. Skull: Postsurgical changes of the left frontal calvarium. No acute calvarial pathology. Sinuses/Orbits: Diffuse mucoperiosteal thickening of paranasal sinuses with complete opacification of the right maxillary sinus similar to prior CT. No air-fluid level. The mastoid air cells are clear. Other: None IMPRESSION: 1. No acute intracranial pathology. 2. Age-related atrophy and chronic microvascular ischemic changes. 3. Left supraclinoid ICA surgical clips. 4. Chronic paranasal sinus disease. Electronically Signed   By: Anner Crete M.D.   On: 04/26/2018 05:12   US Renal  Result Date: 04/26/2018 CLINICAL DATA:  Acute renal failure EXAM: RENAL / URINARY TRACT ULTRASOUND COMPLETE COMPARISON:  None. FINDINGS: Right Kidney: Length: 6.8 cm. Echogenicity within normal limits. No mass or hydronephrosis visualized. Left Kidney: Not well visualized Bladder: Appears normal for degree of bladder distention. IMPRESSION: Somewhat limited exam due the patient's body habitus. Left kidney is not well visualized. No hydronephrosis on the right is seen. Electronically Signed   By: Inez Catalina M.D.   On: 04/26/2018 10:56   Dg Chest Port 1 View  Result Date: 04/27/2018 CLINICAL DATA:  Acute renal injury. EXAM: PORTABLE CHEST 1 VIEW COMPARISON:  04/26/2018. FINDINGS: Prior median sternotomy and cardiac valve replacement. Left atrial appendage clip. Cardiomegaly with mild pulmonary vascular prominence. Mild bibasilar atelectasis/infiltrates. No pleural effusion or pneumothorax. IMPRESSION: 1. Prior median sternotomy with cardiac valve replacement. Left atrial appendage clip noted. Cardiomegaly with mild pulmonary vascular prominence. 2.  Mild bibasilar atelectasis/infiltrates. Electronically Signed   By: Marcello Moores  Register   On: 04/27/2018 09:02     Not all labs, radiology exams or other  studies done during hospitalization come through on my EPIC note; however they are reviewed by me.    Assessment and Plan  Acute metabolic and toxic encephalopathy/severe hypothyroidism- suspected multifactorial including UTI and severe hypothyroidism, polypharmacy; TSH 157, rechecked at 138, free T4 0.2; patient was placed on IV Synthroid then 1 dose of oral T3 on 9/1 5 mcg of Cytomel and restarted on his 175 mcg of Synthroid daily; CT head was unremarkable, UA was positive for UTI,; mental status improved and appeared to be at baseline; discussed with son that patient would probably need follow-up with PCP regarding dementia work-up as well as possible psych meds SNF -admitted for OT/PT;; it is odd that hospitalist felt that patient might need psychiatric medications considering they took him off of all of his psychiatric medications and his depression medications and his anti-anxiolytic anxiety medications on which he had been for 3 years least; patient is already had some psychological disturbances and agitation; we know he is got dementia based on the fact that he drives some city he did not know where he was and his increasing confusion over the past months as reported by son as well as his presentation to me and SNF  Acute respiratory failure with hypoxia and hypercapnia due to hypervolemia-patient placed on BiPAP and treated with IV Lasix with good urine output: Patient was able to be weaned off of O2  Acute kidney injury on chronic kidney disease stage III- recent creatinine 1.6-1.8; DC creatinine 1.76 SNF -follow-up BMP  UTI-UA was positive for UTI and patient was treated with IV Rocephin then transition to oral Keflex and completed course-urine culture was negative  Dementia  with behavioral disturbance SNF -this is a new diagnosis based on patient's presentation to the ED having driven to a town he did not know where he was, classic and based on family history that patient has been more  confused in the last few months classic; who presented here with dementia as well clinically; he also scored on his [place] exam 15/30; will discuss Aricept  Anxiety/mood disorder- Home medications had been Wellbutrin, BuSpar, Lexapro, Remeron, Lamictal SNF -all the above medications were stopped during patient's hospital stay despite the fact that patient had been on them for 3+ years and had been started on them by a psychiatrist at [facility] patient has had education since admitted to skilled nursing facility; the hospital was recommended that may be needed some psych meds even though they took him all the way; interesting; will continue to monitor agitation and consult psych nurse and restart low-dose medication here at SNF if necessary  Paroxysmal atrial fibrillation/history DVT/status post AVR SNF -right controlled by Coreg 25 mg twice daily and prophylaxed with Eliquis 2.5 mg twice daily based on creatinine; cover for history of DVT as well  Hypertension SNF -not well controlled on Coreg 25 mg twice daily and as needed Lasix; patient has obvious pitting edema so we will start Lasix 20 mg daily and follow BMP; will titrate blood pressures after meds as needed  BPH SNF -no signs of obstruction continue Avodart 0.5 mg daily  Hyperlipidemia SNF -no status uncontrolled; continue Lipitor 10 mg daily  GERD SNF -no status uncontrolled; continue omeprazole 20 mg daily  OSA-needs follow-up as outpatient, patient noncompliant with BiPAP   Time spent greater than 45 minutes;> 50% of time with patient was spent reviewing records, labs, tests and studies, counseling and developing plan of care  Webb Silversmith D. [name] Coil, MD

## 2018-05-13 ENCOUNTER — Encounter: Payer: Self-pay | Admitting: Internal Medicine

## 2018-05-13 DIAGNOSIS — E038 Other specified hypothyroidism: Secondary | ICD-10-CM | POA: Insufficient documentation

## 2018-05-13 DIAGNOSIS — J9601 Acute respiratory failure with hypoxia: Secondary | ICD-10-CM | POA: Insufficient documentation

## 2018-05-13 DIAGNOSIS — G9341 Metabolic encephalopathy: Secondary | ICD-10-CM | POA: Insufficient documentation

## 2018-05-13 DIAGNOSIS — Z86718 Personal history of other venous thrombosis and embolism: Secondary | ICD-10-CM | POA: Insufficient documentation

## 2018-05-13 DIAGNOSIS — F0391 Unspecified dementia with behavioral disturbance: Secondary | ICD-10-CM | POA: Insufficient documentation

## 2018-05-13 DIAGNOSIS — F03918 Unspecified dementia, unspecified severity, with other behavioral disturbance: Secondary | ICD-10-CM | POA: Insufficient documentation

## 2018-05-13 DIAGNOSIS — I5033 Acute on chronic diastolic (congestive) heart failure: Secondary | ICD-10-CM | POA: Insufficient documentation

## 2018-05-13 DIAGNOSIS — E039 Hypothyroidism, unspecified: Secondary | ICD-10-CM | POA: Insufficient documentation

## 2018-05-13 DIAGNOSIS — J9602 Acute respiratory failure with hypercapnia: Secondary | ICD-10-CM

## 2018-05-15 LAB — BASIC METABOLIC PANEL
BUN: 23 — AB (ref 4–21)
Creatinine: 1.8 — AB (ref 0.6–1.3)
GLUCOSE: 101
Potassium: 4 (ref 3.4–5.3)
Sodium: 144 (ref 137–147)

## 2018-05-15 LAB — TSH: TSH: 15.28 — AB (ref 0.41–5.90)

## 2018-05-15 LAB — CBC AND DIFFERENTIAL
HEMATOCRIT: 35 — AB (ref 41–53)
HEMOGLOBIN: 11.6 — AB (ref 13.5–17.5)
PLATELETS: 175 (ref 150–399)
WBC: 5.2

## 2018-06-02 ENCOUNTER — Ambulatory Visit: Payer: Self-pay | Admitting: Physician Assistant

## 2018-06-07 ENCOUNTER — Non-Acute Institutional Stay (SKILLED_NURSING_FACILITY): Payer: Medicare Other | Admitting: Internal Medicine

## 2018-06-07 DIAGNOSIS — F419 Anxiety disorder, unspecified: Secondary | ICD-10-CM

## 2018-06-07 DIAGNOSIS — E034 Atrophy of thyroid (acquired): Secondary | ICD-10-CM

## 2018-06-07 DIAGNOSIS — N390 Urinary tract infection, site not specified: Secondary | ICD-10-CM

## 2018-06-07 DIAGNOSIS — I5033 Acute on chronic diastolic (congestive) heart failure: Secondary | ICD-10-CM

## 2018-06-07 DIAGNOSIS — F0281 Dementia in other diseases classified elsewhere with behavioral disturbance: Secondary | ICD-10-CM

## 2018-06-07 DIAGNOSIS — F39 Unspecified mood [affective] disorder: Secondary | ICD-10-CM

## 2018-06-07 DIAGNOSIS — J9602 Acute respiratory failure with hypercapnia: Secondary | ICD-10-CM

## 2018-06-07 DIAGNOSIS — N401 Enlarged prostate with lower urinary tract symptoms: Secondary | ICD-10-CM

## 2018-06-07 DIAGNOSIS — N179 Acute kidney failure, unspecified: Secondary | ICD-10-CM

## 2018-06-07 DIAGNOSIS — K219 Gastro-esophageal reflux disease without esophagitis: Secondary | ICD-10-CM

## 2018-06-07 DIAGNOSIS — G9341 Metabolic encephalopathy: Secondary | ICD-10-CM

## 2018-06-07 DIAGNOSIS — R3911 Hesitancy of micturition: Secondary | ICD-10-CM

## 2018-06-07 DIAGNOSIS — G301 Alzheimer's disease with late onset: Secondary | ICD-10-CM | POA: Diagnosis not present

## 2018-06-07 DIAGNOSIS — E038 Other specified hypothyroidism: Secondary | ICD-10-CM

## 2018-06-07 DIAGNOSIS — J9601 Acute respiratory failure with hypoxia: Secondary | ICD-10-CM

## 2018-06-07 DIAGNOSIS — E039 Hypothyroidism, unspecified: Secondary | ICD-10-CM

## 2018-06-07 DIAGNOSIS — N189 Chronic kidney disease, unspecified: Secondary | ICD-10-CM

## 2018-06-07 DIAGNOSIS — I10 Essential (primary) hypertension: Secondary | ICD-10-CM

## 2018-06-08 ENCOUNTER — Encounter: Payer: Self-pay | Admitting: Internal Medicine

## 2018-06-08 NOTE — Progress Notes (Signed)
Location:  [place] of Service:  SNF (31)SNF Provider; Hennie Duos MD  PCP: No primary care provider on file. Patient Care Team: Nahser, [name], MD as PCP - Cardiology (Cardiology) Nahser, [name], MD as Consulting Physician (Cardiology) [name], MD as Referring Physician (Urology)  Extended Emergency Contact Information Primary Emergency Contact: Gatt,[name] Address: [address]          [place], [place] [address] [name] of [place] Phone: [phone] Mobile Phone: [phone] Relation: Son Secondary Emergency Contact: Helget,[name] Address: [place], [place] Phone: [phone] Relation: Spouse  Allergies  Allergen Reactions  . Haldol [Haloperidol Lactate]     Over-sedation and at times agitation     Chief Complaint  Patient presents with  . Discharge Note    HPI:  84 y.o. male with hypertension, hyperlipidemia, paroxysmal atrial fib, aortic insufficiency, chronic kidney disease stage III, history of DVT, COPD, OSA who was reported to have left his house around noon and ended up and did not [place].  His son filed a missing persons report and a geriatric alert was activated.  Patient seemed more confused than usual and that is always known about the history.  Patient was admitted to [facility] from 8/28-9/10 where he was treated for metabolic encephalopathy, acute respiratory failure with hypoxia and hypercapnia secondary to hypervolemia and severe hypothyroidism.  Hospital course was further complicated by acute on chronic kidney disease stage III.  Patient improved and was admitted to skilled nursing facility for OT/PT and is now ready to be discharged to home.    Past Medical History:  Diagnosis Date  . Anxiety   . Aortic valve insufficiency   . Arthritis    "wee bit; knees, elbows" (10/15/2014)  . Atrial fibrillation ([place])   .  Cardiomyopathy ([place])   . Chronic kidney disease    "down to ~ 1/2 of their regular use; I see kidney dr. in [place]" (10/15/2014)  . Complication of anesthesia    unsure of complications but pt. states there were complications  . COPD (chronic obstructive pulmonary disease) ([place])    on home o2  . Coronary artery disease   . Decreased testosterone level   . Depression   . Difficult intubation    difficult intubation 12/03/09 and 03/27/10 ([facility]); glidescope used 03/27/10  . DVT (deep venous thrombosis) ([place]) ~ 2013   "BLE"  . Dysrhythmia    A. Fib  . Emphysema of lung ([place])   . Enlarged prostate   . Falls frequently    > 20 times in the last year/notes 10/15/2014  . GERD (gastroesophageal reflux disease)   . Heart murmur   . History of blood transfusion 1960   "lots; related to an accident"  . History of echocardiogram    post AVR >> Echo 7/16:  Mild LVH, EF 20-25%, AVR ok (peak 15 mmHg, mean 9 mmHg), MAC, mild MR, severe LAE, mild reduced RVSF, mild RAE, PASP 35 mmHg  . Hypercholesteremia   . Hypertension   . Hypothyroidism   . OSA (obstructive sleep apnea)    "suppose to wear mask; I throw it off in my sleep" (10/15/2014)  . Plantar fasciitis   . Right fibular fracture 10/15/2014  . Seasonal allergies   . Shortness of breath dyspnea   . Syncope 10/15/2014  . Syncope and collapse "several times"  .  Urine frequency   . Urine incontinence   . Varicose veins     Past Surgical History:  Procedure Laterality Date  . ABDOMINAL AORTIC ANEURYSM REPAIR  01/2006; 03/10/2006   Archie Endo 01/12/2011  . AORTIC VALVE REPLACEMENT N/A 02/11/2015   Procedure: AORTIC VALVE REPLACEMENT (AVR);  Surgeon: Grace Isaac, MD;  Location: [facility];  Service: Open Heart Surgery;  Laterality: N/A;  . BRAIN SURGERY  1960 X 4   "S/P got my skull busted"; pt. states removed internal carotid artery  . CARDIAC CATHETERIZATION  1990's X 1; 09/2014   no stents  . CLIPPING OF ATRIAL APPENDAGE N/A 02/11/2015    Procedure: CLIPPING OF ATRIAL APPENDAGE;  Surgeon: Grace Isaac, MD;  Location: [facility];  Service: Open Heart Surgery;  Laterality: N/A;  . ERCP  11/2009   Archie Endo 12/05/2009  . gall stone    . HERNIA REPAIR    . LAPAROSCOPIC CHOLECYSTECTOMY  08/2009   w/IOC/notes 09/18/2009  . LAPAROSCOPIC INCISIONAL / UMBILICAL / VENTRAL HERNIA REPAIR  02/2010   VHR w/mesh/notes 03/28/2010  . NOSE SURGERY    . TEE WITHOUT CARDIOVERSION N/A 02/11/2015   Procedure: TRANSESOPHAGEAL ECHOCARDIOGRAM (TEE);  Surgeon: Grace Isaac, MD;  Location: [address];  Service: Open Heart Surgery;  Laterality: N/A;  . TONSILLECTOMY       reports that he quit smoking about 44 years ago. His smoking use included cigarettes. He has a 44.00 pack-year smoking history. He has never used smokeless tobacco. He reports that he drinks alcohol. He reports that he does not use drugs. Social History   Socioeconomic History  . Marital status: Married    Spouse name: Joycelyn Schmid  . Number of children: 1  . Years of education: Master's  . Highest education level: Not on file  Occupational History  . Not on file  Social Needs  . Financial resource strain: Not on file  . Food insecurity:    Worry: Not on file    Inability: Not on file  . Transportation needs:    Medical: Not on file    Non-medical: Not on file  Tobacco Use  . Smoking status: Former Smoker    Packs/day: 2.00    Years: 22.00    Pack years: 44.00    Types: Cigarettes    Last attempt to quit: 04/29/1974    Years since quitting: 44.1  . Smokeless tobacco: Never Used  Substance and Sexual Activity  . Alcohol use: Yes    Alcohol/week: 0.0 standard drinks    Comment: 1 beer per year   . Drug use: No  . Sexual activity: Not Currently  Lifestyle  . Physical activity:    Days per week: Not on file    Minutes per session: Not on file  . Stress: Not on file  Relationships  . Social connections:    Talks on phone: Not on file    Gets together: Not on file    Attends  religious service: Not on file    Active member of club or organization: Not on file    Attends meetings of clubs or organizations: Not on file    Relationship status: Not on file  . Intimate partner violence:    Fear of current or ex partner: Not on file    Emotionally abused: Not on file    Physically abused: Not on file    Forced sexual activity: Not on file  Other Topics Concern  . Not on file  Social History  Narrative   Lives at home with wife.   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   Right handed.   Caffeine use: 3 cups coffee per day.   Drinks 4 cups tea per day   Drinks soda very rarely     Pertinent  Health Maintenance Due  Topic Date Due  . INFLUENZA VACCINE  Completed  . PNA vac Low Risk Adult  Completed    Medications: Allergies as of 06/07/2018      Reactions   Haldol [haloperidol Lactate]    Over-sedation and at times agitation       Medication List        Accurate as of 06/07/18 11:59 PM. Always use your most recent med list.          apixaban 2.5 MG Tabs tablet Commonly known as:  ELIQUIS Take 1 tablet (2.5 mg total) by mouth 2 (two) times daily.   atorvastatin 10 MG tablet Commonly known as:  LIPITOR Take 1 tablet (10 mg total) by mouth daily.   carvedilol 25 MG tablet Commonly known as:  COREG Take 25 mg by mouth 2 (two) times daily with a meal.   dutasteride 0.5 MG capsule Commonly known as:  AVODART Take 1 capsule (0.5 mg total) by mouth daily.   folic acid 1 MG tablet Commonly known as:  FOLVITE Take 1 tablet (1 mg total) by mouth daily.   furosemide 20 MG tablet Commonly known as:  LASIX Take 1 tablet (20 mg total) by mouth daily as needed for fluid. Only if you gain > 3 lb in 24 hrs   ipratropium-albuterol 0.5-2.5 (3) MG/3ML Soln Commonly known as:  DUONEB Take 3 mLs by nebulization every 4 (four) hours as needed (for wheezing  associated with shortness of breath).   levothyroxine 25 MCG tablet Commonly known as:  SYNTHROID, LEVOTHROID Take 25 mcg by mouth daily before breakfast. To take with 200 mcg for a total of 225 mcg daily   levothyroxine 200 MCG tablet Commonly known as:  SYNTHROID, LEVOTHROID Take 1 tablet (200 mcg total) by mouth daily before breakfast.   mirtazapine 7.5 MG tablet Commonly known as:  REMERON Take 15 mg by mouth at bedtime.   omeprazole 20 MG capsule Commonly known as:  PRILOSEC Take 1 capsule (20 mg total) by mouth daily.   polyethylene glycol packet Commonly known as:  MIRALAX / GLYCOLAX Take 17 g by mouth daily as needed for mild constipation.   potassium chloride 10 MEQ tablet Commonly known as:  K-DUR TAKE 1 TABLET BY MOUTH EVERY DAY. KEEP UPCOMING APPOINTMENT IN JULY FOR FUTURE REFILLS.   senna 8.6 MG Tabs tablet Commonly known as:  SENOKOT Take 1 tablet (8.6 mg total) by mouth 2 (two) times daily.   Tiotropium Bromide-Olodaterol 2.5-2.5 MCG/ACT Aers Inhale 2 puffs into the lungs daily.        Vitals:   06/08/18 0938  BP: 116/70  Pulse: 84  Resp: 19  Temp: 98.1 F (36.7 C)  Weight: 293 lb 8 oz (133.1 kg)  Height: 6' (1.829 m)   Body mass index is 39.81 kg/m.  Physical Exam  GENERAL APPEARANCE: Alert, conversant. No acute distress.  HEENT: Unremarkable. RESPIRATORY: Breathing is even, unlabored. Lung sounds are clear   CARDIOVASCULAR: Heart RRR no murmurs, rubs or gallops. No peripheral edema.  GASTROINTESTINAL: Abdomen is soft, non-tender, not distended w/ normal bowel sounds.  NEUROLOGIC: Cranial nerves 2-12 grossly intact. Moves all extremities   Labs reviewed: Basic Metabolic Panel: Recent Labs    01/28/18 0348  04/27/18 0416  05/01/18 0425 05/02/18 5681 05/03/18 0428  05/07/18 0427 05/08/18 0419 05/09/18 0759 05/15/18  NA 141   < > 141   < > 137 138 138   < > 143 143 143 144  K 3.9   < > 4.2   < > 3.9 4.1 4.2   < > 3.8 3.8 3.5 4.0    CL 106   < > 100   < > 99 98 97*   < > 99 100 98  --   CO2 26   < > 32   < > 28 32 32   < > 31 32 34*  --   GLUCOSE 109*   < > 125*   < > 96 105* 114*   < > 105* 106* 123*  --   BUN 23*   < > 25*   < > 20 21 18    < > 22 18 16  23*  CREATININE 1.65*   < > 2.34*   < > 1.86* 2.04* 1.88*   < > 2.12* 1.84* 1.76* 1.8*  CALCIUM 8.3*   < > 9.0   < > 8.3* 8.4* 8.8*   < > 8.8* 8.9 9.3  --   MG 2.0  --  2.1  --   --   --  2.3  --   --   --   --   --   PHOS 2.8  --  2.8   < > 3.9 3.6 3.8  --   --   --   --   --    < > = values in this interval not displayed.   No results found for: [facility] Liver Function Tests: Recent Labs    04/26/18 0439 04/27/18 0416  05/02/18 0432 05/03/18 0428 05/04/18 0333  AST 38 37  --   --   --  26  ALT 22 19  --   --   --  18  ALKPHOS 91 80  --   --   --  65  BILITOT 0.9 0.9  --   --   --  0.8  PROT 7.8 7.1  --   --   --  6.3*  ALBUMIN 4.4 3.9   < > 3.3* 3.7 3.4*   < > = values in this interval not displayed.   No results for input(s): LIPASE, AMYLASE in the last 8760 hours. No results for input(s): AMMONIA in the last 8760 hours. CBC: Recent Labs    01/05/18 1615  04/26/18 0503 04/27/18 0416  05/06/18 0323 05/07/18 0427 05/08/18 0419 05/15/18  WBC 6.5   < > 6.4 6.2   < > 5.5 4.6 4.9 5.2  NEUTROABS 4.3  --  3.2 3.4  --   --   --   --   --   HGB 13.0   < > 12.5* 12.4*   < > 11.5* 11.0* 11.2* 11.6*  HCT 39.7   < > 40.2 39.4   < > 35.7* 34.4* 35.9* 35*  MCV 93.6   < > 99.3 98.3   < > 96.7 97.7 98.4  --   PLT 275   < > 181 186   < > 167 165 167 175   < > = values in this interval not displayed.   Lipid No results for input(s): CHOL, HDL, LDLCALC,  TRIG in the last 8760 hours. Cardiac Enzymes: Recent Labs    01/28/18 0111 01/28/18 0348 01/28/18 0925  TROPONINI <0.03 <0.03 <0.03   BNP: Recent Labs    01/28/18 0111  BNP 130.2*   CBG: Recent Labs    04/26/18 0440  GLUCAP 136*    Procedures and Imaging Studies During Stay: No results  found.  Assessment/Plan:   Hypothyroidism due to acquired atrophy of thyroid  Acute respiratory failure with hypoxia and hypercapnia (HCC)  Acute on chronic diastolic (congestive) heart failure (HCC)  Late onset Alzheimer's disease with behavioral disturbance (HCC)  Acute metabolic encephalopathy  Severe hypothyroidism  Lower urinary tract infectious disease  Acute kidney injury superimposed on chronic kidney disease (HCC)  Gastroesophageal reflux disease, esophagitis presence not specified  Benign prostatic hyperplasia with urinary hesitancy  Anxiety  Mood disorder with psychosis ([place])  Essential hypertension   Patient is being discharged with the following home health services: Ot/PT/Nursing/Aide/SW   Patient is being discharged with the following durable medical equipment:  Standard WC  Patient has been advised to f/u with their PCP in 1-2 weeks to bring them up to date on their rehab stay.  Social services at facility was responsible for arranging this appointment.  Pt was provided with a 30 day supply of prescriptions for medications and refills must be obtained from their PCP.  For controlled substances, a more limited supply may be provided adequate until PCP appointment only.  Medications have been reconciled.  Time spent > 30 min;> 50% of time with patient was spent reviewing records, labs, tests and studies, counseling and developing plan of care  Inocencio Homes, MD

## 2018-06-10 DIAGNOSIS — R262 Difficulty in walking, not elsewhere classified: Secondary | ICD-10-CM

## 2018-06-10 DIAGNOSIS — M6282 Rhabdomyolysis: Secondary | ICD-10-CM | POA: Diagnosis not present

## 2018-06-10 DIAGNOSIS — Z9181 History of falling: Secondary | ICD-10-CM

## 2018-06-10 DIAGNOSIS — I2510 Atherosclerotic heart disease of native coronary artery without angina pectoris: Secondary | ICD-10-CM

## 2018-06-10 DIAGNOSIS — I429 Cardiomyopathy, unspecified: Secondary | ICD-10-CM

## 2018-06-14 ENCOUNTER — Telehealth: Payer: Self-pay | Admitting: Cardiovascular Disease

## 2018-06-14 NOTE — Telephone Encounter (Signed)
New Message    Lurena Joiner with [facility] is calling to see if she can get orders for physical therapy one week one , two week three. As well as a skilled nursing evaluation.  Please call to discuss

## 2018-06-14 NOTE — Telephone Encounter (Signed)
Spoke to Babb with [facility] and informed her that she would need to contact the patient's PCP pertaining to PT orders.  She verbalized understanding and was thankful for the call.

## 2018-06-18 ENCOUNTER — Encounter: Payer: Self-pay | Admitting: Internal Medicine

## 2018-06-22 ENCOUNTER — Ambulatory Visit: Payer: Medicare Other | Admitting: Nurse Practitioner

## 2018-06-27 ENCOUNTER — Other Ambulatory Visit: Payer: Self-pay | Admitting: Internal Medicine

## 2018-06-27 ENCOUNTER — Ambulatory Visit (INDEPENDENT_AMBULATORY_CARE_PROVIDER_SITE_OTHER): Payer: Medicare Other | Admitting: Nurse Practitioner

## 2018-06-27 ENCOUNTER — Encounter: Payer: Self-pay | Admitting: Nurse Practitioner

## 2018-06-27 VITALS — BP 112/72 | HR 76 | Temp 98.0°F | Ht 72.0 in | Wt 279.2 lb

## 2018-06-27 DIAGNOSIS — J9602 Acute respiratory failure with hypercapnia: Secondary | ICD-10-CM

## 2018-06-27 DIAGNOSIS — F02818 Dementia in other diseases classified elsewhere, unspecified severity, with other behavioral disturbance: Secondary | ICD-10-CM

## 2018-06-27 DIAGNOSIS — R3911 Hesitancy of micturition: Secondary | ICD-10-CM

## 2018-06-27 DIAGNOSIS — G301 Alzheimer's disease with late onset: Secondary | ICD-10-CM | POA: Diagnosis not present

## 2018-06-27 DIAGNOSIS — F0390 Unspecified dementia without behavioral disturbance: Secondary | ICD-10-CM

## 2018-06-27 DIAGNOSIS — J9601 Acute respiratory failure with hypoxia: Secondary | ICD-10-CM | POA: Diagnosis not present

## 2018-06-27 DIAGNOSIS — I5033 Acute on chronic diastolic (congestive) heart failure: Secondary | ICD-10-CM | POA: Diagnosis not present

## 2018-06-27 DIAGNOSIS — K219 Gastro-esophageal reflux disease without esophagitis: Secondary | ICD-10-CM

## 2018-06-27 DIAGNOSIS — F0281 Dementia in other diseases classified elsewhere with behavioral disturbance: Secondary | ICD-10-CM

## 2018-06-27 DIAGNOSIS — I10 Essential (primary) hypertension: Secondary | ICD-10-CM

## 2018-06-27 DIAGNOSIS — G4733 Obstructive sleep apnea (adult) (pediatric): Secondary | ICD-10-CM

## 2018-06-27 DIAGNOSIS — E034 Atrophy of thyroid (acquired): Secondary | ICD-10-CM | POA: Diagnosis not present

## 2018-06-27 DIAGNOSIS — F419 Anxiety disorder, unspecified: Secondary | ICD-10-CM

## 2018-06-27 DIAGNOSIS — N401 Enlarged prostate with lower urinary tract symptoms: Secondary | ICD-10-CM

## 2018-06-27 DIAGNOSIS — J449 Chronic obstructive pulmonary disease, unspecified: Secondary | ICD-10-CM

## 2018-06-27 DIAGNOSIS — I480 Paroxysmal atrial fibrillation: Secondary | ICD-10-CM

## 2018-06-27 MED ORDER — TIOTROPIUM BROMIDE MONOHYDRATE 2.5 MCG/ACT IN AERS: INHALATION_SPRAY | RESPIRATORY_TRACT | Status: DC

## 2018-06-27 MED ORDER — HYDRALAZINE HCL 50 MG PO TABS: 50.0000 mg | ORAL_TABLET | Freq: Two times a day (BID) | ORAL | Status: DC

## 2018-06-27 MED ORDER — MEMANTINE HCL ER 7 & 14 & 21 &28 MG PO CP24: ORAL_CAPSULE | ORAL | 0 refills | Status: DC

## 2018-06-27 NOTE — Progress Notes (Signed)
Careteam: Patient Care Team: Nahser, [name], MD as PCP - Cardiology (Cardiology) Nahser, [name], MD as Consulting Physician (Cardiology) [name], MD as Referring Physician (Urology)  Advanced Directive information Does Patient Have a Medical Advance Directive?: No  Allergies  Allergen Reactions  . Haldol [Haloperidol Lactate]     Over-sedation and at times agitation     Chief Complaint  Patient presents with  . Medical Management of Chronic Issues    Pt is being seen to establish care. Previous PCP was Dr. Sheryn Bison with [facility].      HPI: Patient is a 84 y.o. male seen in the office today for establish care.  Unsure when he had his last AWV/physical.  Has had multiple PCP in the past, retired, changed practices and most recently he was dismissed from last practice due to no shows. Son now helping with appts to prevent this.   Most recently pt was hospitalized due to metabolic encephalopathy, acute respiratory failure with hypoxia and hypercapnia secondary to hypervolemia and severe hypothyroidism.  Son reports he was not taking medication correctly but now he is.  Son is monitoring pills daily to make sure he is taking.   htn- blood pressure has been low at home. Taking hydralazine, lasix, carvediolol- taking hydralazine 50 mg but taking 2 tablets in the am and 1 in the pm son concerned over low blood pressure readings.   A fib/CHF/aortic valve replacement- taking eliquis 2.5 mg by mouth twice daily for anticoagulation, carvedilol 25 mg by mouth twice daily and lasix 20 mg daily along with potassium 10 meq daily  Hyperlipidemia- taking lipitor 10 mg by mouth.   Memory loss- taking aricept 10 mg by mouth daily, no formal dementia diagnosis however recently silver alert was placed due to pt driving and getting lost, no longer driving. Had recent CT which showed  Advanced chronic small vessel disease and previous surgical clipping of the left  ICA siphon.  BPH- using avodart qhs with good effect  GERD- controlled with omeprazole 20 mg daily   Hypothyroid- taking 225 mcg- TSH elevated during hospitalization and medication was increased.   Pt with hx of anxiety and depression- previously on Wellbutrin 300 mg daily, buspar 15 mg BID, lamotrigine 100 mg 2 in the am and 1 in the pm and lexapro 10 mg, clonazepam PRN and Remeron 15 mg qhs . All was stopped during hospitalization and Remeron was resume. Pt was previously seeing psychiatrist and Dr Dellis Filbert   COPD- using spiriva 2 puffs daily, not needing duoneb but has if he is wheezing. On chronic O2 3L   Folic acid 1 mg   With multiple hospitalizations.   OSA- not currently on CPAP, does not currently follow with pulmonary doctor. Previously had CPAP but this was over 10 years ago   4 months ago pt was at a higher cognitive state then currently. It has improved since hospitalization but still declined.  Had home health but due to being in between providers this has not started yet.  Plans to have PT, OT, ST  Hx of trouble swallowing.   Review of Systems:  Review of Systems  Unable to perform ROS: Dementia    Past Medical History:  Diagnosis Date  . Anxiety   . Aortic valve insufficiency   . Arthritis    "wee bit; knees, elbows" (10/15/2014)  . Atrial fibrillation ([place])   . Cardiomyopathy ([place])   . Chronic kidney disease    "down to ~  1/2 of their regular use; I see kidney dr. in [place]" (10/15/2014)  . Complication of anesthesia    unsure of complications but pt. states there were complications  . COPD (chronic obstructive pulmonary disease) ([place])    on home o2  . Coronary artery disease   . Decreased testosterone level   . Depression   . Difficult intubation    difficult intubation 12/03/09 and 03/27/10 ([facility]); glidescope used 03/27/10  . DVT (deep venous thrombosis) ([place]) ~ 2013   "BLE"  . Dysrhythmia    A. Fib  . Emphysema of lung ([place])   . Enlarged prostate    . Falls frequently    > 20 times in the last year/notes 10/15/2014  . GERD (gastroesophageal reflux disease)   . Heart murmur   . History of blood transfusion 1960   "lots; related to an accident"  . History of echocardiogram    post AVR >> Echo 7/16:  Mild LVH, EF 20-25%, AVR ok (peak 15 mmHg, mean 9 mmHg), MAC, mild MR, severe LAE, mild reduced RVSF, mild RAE, PASP 35 mmHg  . Hypercholesteremia   . Hypertension   . Hypothyroidism   . OSA (obstructive sleep apnea)    "suppose to wear mask; I throw it off in my sleep" (10/15/2014)  . Plantar fasciitis   . Right fibular fracture 10/15/2014  . Seasonal allergies   . Shortness of breath dyspnea   . Syncope 10/15/2014  . Syncope and collapse "several times"  . Urine frequency   . Urine incontinence   . Varicose veins    Past Surgical History:  Procedure Laterality Date  . ABDOMINAL AORTIC ANEURYSM REPAIR  01/2006; 03/10/2006   Archie Endo 01/12/2011  . AORTIC VALVE REPLACEMENT N/A 02/11/2015   Procedure: AORTIC VALVE REPLACEMENT (AVR);  Surgeon: Grace Isaac, MD;  Location: [facility];  Service: Open Heart Surgery;  Laterality: N/A;  . BRAIN SURGERY  1960 X 4   "S/P got my skull busted"; pt. states removed internal carotid artery  . CARDIAC CATHETERIZATION  1990's X 1; 09/2014   no stents  . CLIPPING OF ATRIAL APPENDAGE N/A 02/11/2015   Procedure: CLIPPING OF ATRIAL APPENDAGE;  Surgeon: Grace Isaac, MD;  Location: [facility];  Service: Open Heart Surgery;  Laterality: N/A;  . ERCP  11/2009   Archie Endo 12/05/2009  . gall stone    . HERNIA REPAIR    . LAPAROSCOPIC CHOLECYSTECTOMY  08/2009   w/IOC/notes 09/18/2009  . LAPAROSCOPIC INCISIONAL / UMBILICAL / VENTRAL HERNIA REPAIR  02/2010   VHR w/mesh/notes 03/28/2010  . NOSE SURGERY    . TEE WITHOUT CARDIOVERSION N/A 02/11/2015   Procedure: TRANSESOPHAGEAL ECHOCARDIOGRAM (TEE);  Surgeon: Grace Isaac, MD;  Location: [facility];  Service: Open Heart Surgery;  Laterality: N/A;  . TONSILLECTOMY      Social History:   reports that he quit smoking about 44 years ago. His smoking use included cigarettes. He has a 44.00 pack-year smoking history. He has never used smokeless tobacco. He reports that he drank alcohol. He reports that he does not use drugs.  Family History  Problem Relation Age of Onset  . Rheum arthritis Mother   . Diabetes Mother   . Heart disease Father   . Rheum arthritis Father   . Kidney disease Father   . Other Sister        Murdered  . Breast cancer Sister   . Colon cancer Neg Hx   . Colon polyps Neg Hx   .  Liver disease Neg Hx     Medications: Patient's Medications  New Prescriptions   No medications on file  Previous Medications   APIXABAN (ELIQUIS) 2.5 MG TABS TABLET    Take 1 tablet (2.5 mg total) by mouth 2 (two) times daily.   ATORVASTATIN (LIPITOR) 10 MG TABLET    Take 1 tablet (10 mg total) by mouth daily.   CARVEDILOL (COREG) 25 MG TABLET    Take 25 mg by mouth 2 (two) times daily with a meal.   DONEPEZIL (ARICEPT) 10 MG TABLET    Take 10 mg by mouth daily.   DUTASTERIDE (AVODART) 0.5 MG CAPSULE    Take 1 capsule (0.5 mg total) by mouth daily.   FOLIC ACID (FOLVITE) 1 MG TABLET    Take 1 tablet (1 mg total) by mouth daily.   FUROSEMIDE (LASIX) 20 MG TABLET    Take 1 tablet (20 mg total) by mouth daily as needed for fluid. Only if you gain > 3 lb in 24 hrs   HYDRALAZINE (APRESOLINE) 50 MG TABLET    Take 50 mg by mouth 3 (three) times daily.   IPRATROPIUM-ALBUTEROL (DUONEB) 0.5-2.5 (3) MG/3ML SOLN    Take 3 mLs by nebulization every 4 (four) hours as needed (for wheezing associated with shortness of breath).   LEVOTHYROXINE (SYNTHROID, LEVOTHROID) 200 MCG TABLET    Take 1 tablet (200 mcg total) by mouth daily before breakfast.   LEVOTHYROXINE (SYNTHROID, LEVOTHROID) 25 MCG TABLET    Take 25 mcg by mouth daily before breakfast. To take with 200 mcg for a total of 225 mcg daily   MIRTAZAPINE (REMERON) 7.5 MG TABLET    Take 15 mg by mouth at bedtime.     OMEPRAZOLE (PRILOSEC) 20 MG CAPSULE    Take 1 capsule (20 mg total) by mouth daily.   POTASSIUM CHLORIDE (K-DUR) 10 MEQ TABLET    TAKE 1 TABLET BY MOUTH EVERY DAY. KEEP UPCOMING APPOINTMENT IN JULY FOR FUTURE REFILLS.   SENNA (SENOKOT) 8.6 MG TABS TABLET    Take 1 tablet (8.6 mg total) by mouth 2 (two) times daily.  Modified Medications   No medications on file  Discontinued Medications   POLYETHYLENE GLYCOL (MIRALAX / GLYCOLAX) PACKET    Take 17 g by mouth daily as needed for mild constipation.   TIOTROPIUM BROMIDE-OLODATEROL (STIOLTO RESPIMAT) 2.5-2.5 MCG/ACT AERS    Inhale 2 puffs into the lungs daily.     Physical Exam:  Vitals:   06/27/18 1330  BP: 112/72  Pulse: 76  Temp: 98 F (36.7 C)  TempSrc: Oral  SpO2: 97%  Weight: 279 lb 3.2 oz (126.6 kg)  Height: 6' (1.829 m)   Body mass index is 37.87 kg/m.  Physical Exam  Constitutional: He appears well-developed and well-nourished. No distress.  Obese   HENT:  Head: Normocephalic and atraumatic.  Mouth/Throat: Oropharynx is clear and moist. No oropharyngeal exudate.  Eyes: Pupils are equal, round, and reactive to light. Conjunctivae and EOM are normal.  Neck: Normal range of motion. Neck supple.  Cardiovascular: Normal rate, regular rhythm and normal heart sounds.  Pulmonary/Chest: Effort normal and breath sounds normal.  Abdominal: Soft. Bowel sounds are normal.  Musculoskeletal: He exhibits edema (trace bilaterally). He exhibits no tenderness.  Neurological: He is alert.  Skin: Skin is warm and dry. He is not diaphoretic.  Vascular changes noted to bilateral LE  Psychiatric: He has a normal mood and affect.    Labs reviewed: Basic Metabolic Panel: Recent Labs  01/28/18 2637  04/26/18 8588 04/27/18 5027 04/27/18 0749  05/01/18 0425 05/02/18 0432 05/03/18 0428  05/07/18 0427 05/08/18 0419 05/09/18 0759 05/15/18  NA 141   < >  --  141  --    < > 137 138 138   < > 143 143 143 144  K 3.9   < >  --  4.2   --    < > 3.9 4.1 4.2   < > 3.8 3.8 3.5 4.0  CL 106   < >  --  100  --    < > 99 98 97*   < > 99 100 98  --   CO2 26   < >  --  32  --    < > 28 32 32   < > 31 32 34*  --   GLUCOSE 109*   < >  --  125*  --    < > 96 105* 114*   < > 105* 106* 123*  --   BUN 23*   < >  --  25*  --    < > _0 < > _1 23*  CREATININE 1.65*   < >  --  2.34*  --    < > 1.86* 2.04* 1.88*   < > 2.12* 1.84* 1.76* 1.8*  CALCIUM 8.3*   < >  --  9.0  --    < > 8.3* 8.4* 8.8*   < > 8.8* 8.9 9.3  --   MG 2.0  --   --  2.1  --   --   --   --  2.3  --   --   --   --   --   PHOS 2.8  --   --  2.8  --    < > 3.9 3.6 3.8  --   --   --   --   --   TSH 3.852  --  157.502*  --  138.414*  --   --   --   --   --   --   --   --  15.28*   < > = values in this interval not displayed.  CrCl cannot be calculated (Patient's most recent lab result is older than the maximum 21 days allowed.).  Liver Function Tests: Recent Labs    04/26/18 0439 04/27/18 0416  05/02/18 0432 05/03/18 0428 05/04/18 0333  AST 38 37  --   --   --  26  ALT 22 19  --   --   --  18  ALKPHOS 91 80  --   --   --  65  BILITOT 0.9 0.9  --   --   --  0.8  PROT 7.8 7.1  --   --   --  6.3*  ALBUMIN 4.4 3.9   < > 3.3* 3.7 3.4*   < > = values in this interval not displayed.   No results for input(s): LIPASE, AMYLASE in the last 8760 hours. No results for input(s): AMMONIA in the last 8760 hours. CBC: Recent Labs    01/05/18 1615  04/26/18 0503 04/27/18 0416  05/06/18 0323 05/07/18 0427 05/08/18 0419 05/15/18  WBC 6.5   < > 6.4 6.2   < > 5.5 4.6 4.9 5.2  NEUTROABS 4.3  --  3.2 3.4  --   --   --   --   --  HGB 13.0   < > 12.5* 12.4*   < > 11.5* 11.0* 11.2* 11.6*  HCT 39.7   < > 40.2 39.4   < > 35.7* 34.4* 35.9* 35*  MCV 93.6   < > 99.3 98.3   < > 96.7 97.7 98.4  --   PLT 275   < > 181 186   < > 167 165 167 175   < > = values in this interval not displayed.   Lipid Panel: No results for input(s): CHOL, HDL, LDLCALC, TRIG, CHOLHDL, LDLDIRECT  in the last 8760 hours. TSH: Recent Labs    04/26/18 0443 04/27/18 0749 05/15/18  TSH 157.502* 138.414* 15.28*   A1C: Lab Results  Component Value Date   HGBA1C 5.7 (H) 01/28/2018     Assessment/Plan 1. Hypothyroidism due to acquired atrophy of thyroid -synthroid increased during hospitalization, will follow up TSH at this time - TSH  2. Acute on chronic diastolic (congestive) heart failure (HCC) Stable, taking lasix 20 mg daily and weight has been unchanged. Without increase in shortness of breath or edema. To continue coreg and lasix.  3. Late onset Alzheimer's disease with behavioral disturbance ([place]) Will need to get MMSE, recently got in car and got lost, found over an hour away. CT of brain showing small vessel disease with atrophy. TSH was also severely elevated and since synthroid has been adjusted to help correct this. Son has taken over driving and helping get him to appts. Continues on aricept, will start namenda titration at this time.   4. Acute respiratory failure with hypoxia and hypercapnia (HCC) -pt with COPD, OSA and CHF. Continues on chronic O2, does not have CPAP, referral placed to pulmonary at this time - BASIC METABOLIC PANEL WITH GFR  5. Gastroesophageal reflux disease, esophagitis presence not specified Controlled on omeprazole.   6. Benign prostatic hyperplasia with urinary hesitancy Improved symptoms on avodart  7. Anxiety -medication recently revised, currently on remeron 15 mg by mouth qhs at this time.  8. Paroxysmal A-fib (HCC) Rate controlled on coreg, continues on eliquis 2.5 mg BID.   9. Essential hypertension -blood pressure has been on the low side but apparently son giving hydralazine 100 mg by mouth in the morning and 50 mg in the evening, will decrease to 50 mg BID and continue coreg and lasix.   - hydrALAZINE (APRESOLINE) 50 MG tablet; Take 1 tablet (50 mg total) by mouth 2 (two) times daily.  10. OSA (obstructive sleep apnea) -  Ambulatory referral to Pulmonology for evaluation for CPAP   11. COPD (chronic obstructive pulmonary disease) with chronic bronchitis ([place]) -stable at this time - Ambulatory referral to Pulmonology  12. Dementia without behavioral disturbance, unspecified dementia type ([place]) -continues on Aricept, will add namenda.  - Memantine HCl ER (NAMENDA XR TITRATION PACK) 7 & 14 & 21 &28 MG CP24; As directed  Dispense: 30 capsule; Refill: 0  Next appt: 4 weeks for awv and ev, will need lipids.  Carlos American. [place], [facility] [phone]

## 2018-06-27 NOTE — Patient Instructions (Signed)
To take hydralazine 50 mg by mouth twice daily  To start namenda titration pack  Will get blood work today  Follow up in 4 weeks for AWV and EV

## 2018-06-28 ENCOUNTER — Other Ambulatory Visit: Payer: Self-pay

## 2018-06-28 DIAGNOSIS — R5381 Other malaise: Secondary | ICD-10-CM

## 2018-06-28 DIAGNOSIS — E034 Atrophy of thyroid (acquired): Secondary | ICD-10-CM

## 2018-06-28 DIAGNOSIS — E785 Hyperlipidemia, unspecified: Secondary | ICD-10-CM

## 2018-06-28 LAB — BASIC METABOLIC PANEL WITH GFR
BUN/Creatinine Ratio: 17 (calc) (ref 6–22)
BUN: 30 mg/dL — ABNORMAL HIGH (ref 7–25)
CALCIUM: 9.1 mg/dL (ref 8.6–10.3)
CO2: 34 mmol/L — AB (ref 20–32)
CREATININE: 1.74 mg/dL — AB (ref 0.70–1.11)
Chloride: 102 mmol/L (ref 98–110)
GFR, EST AFRICAN AMERICAN: 41 mL/min/{1.73_m2} — AB (ref >=60)
GFR, EST NON AFRICAN AMERICAN: 35 mL/min/{1.73_m2} — AB (ref >=60)
Glucose, Bld: 108 mg/dL (ref 65–139)
POTASSIUM: 4.2 mmol/L (ref 3.5–5.3)
Sodium: 143 mmol/L (ref 135–146)

## 2018-06-28 LAB — TSH: TSH: 0.39 m[IU]/L — AB (ref 0.40–4.50)

## 2018-06-29 ENCOUNTER — Other Ambulatory Visit: Payer: Self-pay | Admitting: Internal Medicine

## 2018-06-29 ENCOUNTER — Other Ambulatory Visit: Payer: Self-pay | Admitting: Nurse Practitioner

## 2018-06-29 DIAGNOSIS — F0390 Unspecified dementia without behavioral disturbance: Secondary | ICD-10-CM

## 2018-06-29 DIAGNOSIS — I10 Essential (primary) hypertension: Secondary | ICD-10-CM

## 2018-06-29 NOTE — Telephone Encounter (Signed)
Pharmacy is requesting to change patient from namenda xr titration pack to namenda xr 14 mg.   If ok, please provide instructions, dosage and quantity.

## 2018-06-30 NOTE — Telephone Encounter (Signed)
Let have him pick up samples from office and cancel this prescription

## 2018-06-30 NOTE — Telephone Encounter (Signed)
Rx was cancelled. Pt son will pick up sample pack on 07/03/18.

## 2018-07-03 ENCOUNTER — Other Ambulatory Visit: Payer: Self-pay | Admitting: Nurse Practitioner

## 2018-07-04 ENCOUNTER — Encounter: Payer: Self-pay | Admitting: Nurse Practitioner

## 2018-07-05 DIAGNOSIS — I429 Cardiomyopathy, unspecified: Secondary | ICD-10-CM

## 2018-07-05 DIAGNOSIS — I5033 Acute on chronic diastolic (congestive) heart failure: Secondary | ICD-10-CM

## 2018-07-05 DIAGNOSIS — N189 Chronic kidney disease, unspecified: Secondary | ICD-10-CM

## 2018-07-05 DIAGNOSIS — I130 Hypertensive heart and chronic kidney disease with heart failure and stage 1 through stage 4 chronic kidney disease, or unspecified chronic kidney disease: Secondary | ICD-10-CM

## 2018-07-05 DIAGNOSIS — I2510 Atherosclerotic heart disease of native coronary artery without angina pectoris: Secondary | ICD-10-CM

## 2018-07-07 ENCOUNTER — Telehealth: Payer: Self-pay | Admitting: *Deleted

## 2018-07-07 NOTE — Telephone Encounter (Signed)
Delene Loll with [facility] called and stated that the [facility] patient calls it on Right Lower Extremity was intact when PT was there earlier this week. Now the skin on front distal right leg is now having some open places. She educated him on what it would look like if it got infected.  Dee with [facility] is requesting orders for Nursing to access him.  Verbal order given per office protocol.

## 2018-07-09 NOTE — Telephone Encounter (Signed)
Okay; thanks.

## 2018-07-12 ENCOUNTER — Ambulatory Visit: Payer: Medicare Other | Admitting: Nurse Practitioner

## 2018-07-12 ENCOUNTER — Other Ambulatory Visit: Payer: Self-pay | Admitting: Internal Medicine

## 2018-07-12 ENCOUNTER — Other Ambulatory Visit: Payer: Medicare Other

## 2018-07-12 ENCOUNTER — Encounter: Payer: Self-pay | Admitting: Nurse Practitioner

## 2018-07-12 VITALS — BP 114/72 | HR 63 | Temp 97.8°F | Ht 72.0 in | Wt 285.0 lb

## 2018-07-12 DIAGNOSIS — E034 Atrophy of thyroid (acquired): Secondary | ICD-10-CM

## 2018-07-12 DIAGNOSIS — G301 Alzheimer's disease with late onset: Secondary | ICD-10-CM | POA: Diagnosis not present

## 2018-07-12 DIAGNOSIS — F0281 Dementia in other diseases classified elsewhere with behavioral disturbance: Secondary | ICD-10-CM

## 2018-07-12 DIAGNOSIS — K219 Gastro-esophageal reflux disease without esophagitis: Secondary | ICD-10-CM

## 2018-07-12 DIAGNOSIS — R197 Diarrhea, unspecified: Secondary | ICD-10-CM

## 2018-07-12 DIAGNOSIS — I10 Essential (primary) hypertension: Secondary | ICD-10-CM

## 2018-07-12 MED ORDER — MEMANTINE HCL ER 7 MG PO CP24: ORAL_CAPSULE | ORAL | 0 refills | Status: DC

## 2018-07-12 NOTE — Patient Instructions (Addendum)
STOP NAMZARIC Start Namenda XR 14 mg daily for 1 week, 21 mg daily for 1 week then 28 mg daily   To collect stool sample and bring back to office for testing  STOP OMEPRAZOLE  STOP HYDRALAZINE and continue to monitor blood pressure   Keep follow up appt as scheduled

## 2018-07-12 NOTE — Progress Notes (Signed)
Careteam: Patient Care Team: Lauree Chandler, NP as PCP - General (Geriatric Medicine) Nahser, [name], MD as Consulting Physician (Cardiology) [name], MD as Referring Physician (Urology)  Advanced Directive information Does Patient Have a Medical Advance Directive?: Yes, Type of Advance Directive: Out of facility DNR (pink MOST or yellow form), Pre-existing out of facility DNR order (yellow form or pink MOST form): Yellow form placed in chart (order not valid for inpatient use)  Allergies  Allergen Reactions  . Haldol [Haloperidol Lactate]     Over-sedation and at times agitation     Chief Complaint  Patient presents with  . Acute Visit    Pt is being seen due to nausea and diarrhea (2 -3 episodes daily) for last 3 weeks.      HPI: Patient is a 84 y.o. male seen in the office today due to diarrhea for 2-3 weeks. Reports this has been going on and is unchanged. ST felt concerned and wanted an appt. She is concerned about him living alone. Son states ST thought he was really sick but more along baseline.  No Nausea or vomiting. 2 days ago just felt really bad Yesterday and today are better.    Diarrhea started during the nursing home and has been unchanged since. New medication includes omeprazole and aricept. Treated in hospital for UTI with antibiotics    He is not interested in moving or going into a SNF. Wife has dementia and lives there.  Son states he is doing his medication as directed but not necessarily using his nebulizer. Reports shortness of breath but not new or worse.   Wears glasses but has lost them.  Reports blurred vision and sometimes double vision but right now things are normal.   Blood pressure borderline low, has not taken medications this morning.   Review of Systems:  Review of Systems  Constitutional: Positive for malaise/fatigue (chronic). Negative for chills and fever.  Eyes: Positive for blurred vision and double vision.     No current visual changes  Respiratory: Positive for shortness of breath (chronic and stable, no worsening of symptoms).   Cardiovascular: Negative for chest pain and palpitations.  Gastrointestinal: Positive for diarrhea. Negative for abdominal pain, heartburn, nausea and vomiting.  Genitourinary: Positive for frequency.  Neurological: Negative for dizziness and headaches.  Psychiatric/Behavioral: Positive for memory loss.    Past Medical History:  Diagnosis Date  . Anxiety   . Aortic valve insufficiency   . Arthritis    "wee bit; knees, elbows" (10/15/2014)  . Atrial fibrillation ([place])   . Cardiomyopathy ([place])   . Chronic kidney disease    "down to ~ 1/2 of their regular use; I see kidney dr. in [place]" (10/15/2014)  . Complication of anesthesia    unsure of complications but pt. states there were complications  . COPD (chronic obstructive pulmonary disease) ([place])    on home o2  . Coronary artery disease   . Decreased testosterone level   . Depression   . Difficult intubation    difficult intubation 12/03/09 and 03/27/10 ([facility]); glidescope used 03/27/10  . DVT (deep venous thrombosis) ([place]) ~ 2013   "BLE"  . Dysrhythmia    A. Fib  . Emphysema of lung ([place])   . Enlarged prostate   . Falls frequently    > 20 times in the last year/notes 10/15/2014  . GERD (gastroesophageal reflux disease)   . Heart murmur   . History of blood transfusion 1960   "  lots; related to an accident"  . History of echocardiogram    post AVR >> Echo 7/16:  Mild LVH, EF 20-25%, AVR ok (peak 15 mmHg, mean 9 mmHg), MAC, mild MR, severe LAE, mild reduced RVSF, mild RAE, PASP 35 mmHg  . Hypercholesteremia   . Hypertension   . Hypothyroidism   . OSA (obstructive sleep apnea)    "suppose to wear mask; I throw it off in my sleep" (10/15/2014)  . Plantar fasciitis   . Right fibular fracture 10/15/2014  . Seasonal allergies   . Shortness of breath dyspnea   . Syncope 10/15/2014  . Syncope and collapse  "several times"  . Urine frequency   . Urine incontinence   . Varicose veins    Past Surgical History:  Procedure Laterality Date  . ABDOMINAL AORTIC ANEURYSM REPAIR  01/2006; 03/10/2006   Archie Endo 01/12/2011  . AORTIC VALVE REPLACEMENT N/A 02/11/2015   Procedure: AORTIC VALVE REPLACEMENT (AVR);  Surgeon: Grace Isaac, MD;  Location: [facility];  Service: Open Heart Surgery;  Laterality: N/A;  . BRAIN SURGERY  1960 X 4   "S/P got my skull busted"; pt. states removed internal carotid artery  . CARDIAC CATHETERIZATION  1990's X 1; 09/2014   no stents  . CLIPPING OF ATRIAL APPENDAGE N/A 02/11/2015   Procedure: CLIPPING OF ATRIAL APPENDAGE;  Surgeon: Grace Isaac, MD;  Location: [facility];  Service: Open Heart Surgery;  Laterality: N/A;  . ERCP  11/2009   Archie Endo 12/05/2009  . gall stone    . HERNIA REPAIR    . LAPAROSCOPIC CHOLECYSTECTOMY  08/2009   w/IOC/notes 09/18/2009  . LAPAROSCOPIC INCISIONAL / UMBILICAL / VENTRAL HERNIA REPAIR  02/2010   VHR w/mesh/notes 03/28/2010  . NOSE SURGERY    . TEE WITHOUT CARDIOVERSION N/A 02/11/2015   Procedure: TRANSESOPHAGEAL ECHOCARDIOGRAM (TEE);  Surgeon: Grace Isaac, MD;  Location: [facility];  Service: Open Heart Surgery;  Laterality: N/A;  . TONSILLECTOMY     Social History:   reports that he quit smoking about 44 years ago. His smoking use included cigarettes. He has a 44.00 pack-year smoking history. He has never used smokeless tobacco. He reports that he drank alcohol. He reports that he does not use drugs.  Family History  Problem Relation Age of Onset  . Rheum arthritis Mother   . Diabetes Mother   . Heart disease Father   . Rheum arthritis Father   . Kidney disease Father   . Other Sister        Murdered  . Breast cancer Sister   . Colon cancer Neg Hx   . Colon polyps Neg Hx   . Liver disease Neg Hx     Medications: Patient's Medications  New Prescriptions   No medications on file  Previous Medications   APIXABAN (ELIQUIS) 2.5 MG TABS  TABLET    Take 1 tablet (2.5 mg total) by mouth 2 (two) times daily.   ATORVASTATIN (LIPITOR) 10 MG TABLET    TAKE 1 TABLET BY MOUTH EVERY DAY   CARVEDILOL (COREG) 25 MG TABLET    Take 25 mg by mouth 2 (two) times daily with a meal.   DUTASTERIDE (AVODART) 0.5 MG CAPSULE    TAKE 1 CAPSULE BY MOUTH EVERY DAY   FOLIC ACID (FOLVITE) 1 MG TABLET    TAKE 1 TABLET BY MOUTH EVERY DAY   FUROSEMIDE (LASIX) 20 MG TABLET    TAKE 1 TABLET BY MOUTH DAILY FOR WEIGHT GAIN 3 POUNDS IN  24 HOURS   HYDRALAZINE (APRESOLINE) 50 MG TABLET    Take 50 mg by mouth 2 (two) times daily.   IPRATROPIUM-ALBUTEROL (DUONEB) 0.5-2.5 (3) MG/3ML SOLN    USE 1 VIAL IN NEBULIZER EVERY 4 HOURS AS NEEDED   KLOR-CON M10 10 MEQ TABLET    TAKE 1 TABLET BY MOUTH EVERY DAY   LEVOTHYROXINE (SYNTHROID, LEVOTHROID) 200 MCG TABLET    TAKE 1 TABLET BY MOUTH EVERY DAY (ALONG WITH 25MCG TABLET)   LEVOTHYROXINE (SYNTHROID, LEVOTHROID) 25 MCG TABLET    TAKE 1 TABLET BY MOUTH EVERY DAY   MEMANTINE HCL-DONEPEZIL HCL (NAMZARIC) 7 & 14 & 21 &28 -10 MG C4PK    Use as directed   MIRTAZAPINE (REMERON) 7.5 MG TABLET    Take 15 mg by mouth at bedtime.    OMEPRAZOLE (PRILOSEC) 20 MG CAPSULE    TAKE 1 CAPSULE BY MOUTH EVERY DAY   POTASSIUM CHLORIDE (K-DUR) 10 MEQ TABLET    TAKE 1 TABLET BY MOUTH EVERY DAY. KEEP UPCOMING APPOINTMENT IN JULY FOR FUTURE REFILLS.   TIOTROPIUM BROMIDE MONOHYDRATE (SPIRIVA RESPIMAT) 2.5 MCG/ACT AERS    2 puffs daily  Modified Medications   No medications on file  Discontinued Medications   DONEPEZIL (ARICEPT) 10 MG TABLET    Take 10 mg by mouth daily.   HYDRALAZINE (APRESOLINE) 50 MG TABLET    TAKE 1 TABLET BY MOUTH 3 TIMES A DAY     Physical Exam:  Vitals:   07/12/18 0859  BP: 114/72  Pulse: 63  Temp: 97.8 F (36.6 C)  TempSrc: Oral  SpO2: 95%  Weight: 285 lb (129.3 kg)  Height: 6' (1.829 m)   Body mass index is 38.65 kg/m.  Physical Exam  Constitutional: He appears well-developed and well-nourished. No  distress.  Obese   HENT:  Head: Normocephalic and atraumatic.  Mouth/Throat: Oropharynx is clear and moist. No oropharyngeal exudate.  Eyes: Pupils are equal, round, and reactive to light. Conjunctivae and EOM are normal.  Neck: Normal range of motion. Neck supple.  Cardiovascular: Normal rate, regular rhythm and normal heart sounds.  Pulmonary/Chest: Effort normal and breath sounds normal.  Abdominal: Soft. Bowel sounds are normal.  Musculoskeletal: He exhibits edema (right < left). He exhibits no tenderness.  Neurological: He is alert.  Skin: Skin is warm and dry. He is not diaphoretic.  Vascular changes noted to bilateral LE  Psychiatric: He has a normal mood and affect.    Labs reviewed: Basic Metabolic Panel: Recent Labs    01/28/18 0348  04/27/18 0416 04/27/18 0749  05/01/18 0425 05/02/18 0432 05/03/18 0428  05/08/18 0419 05/09/18 0759 05/15/18 06/27/18 1448  NA 141   < > 141  --    < > 137 138 138   < > 143 143 144 143  K 3.9   < > 4.2  --    < > 3.9 4.1 4.2   < > 3.8 3.5 4.0 4.2  CL 106   < > 100  --    < > 99 98 97*   < > 100 98  --  102  CO2 26   < > 32  --    < > 28 32 32   < > 32 34*  --  34*  GLUCOSE 109*   < > 125*  --    < > 96 105* 114*   < > 106* 123*  --  108  BUN 23*   < > 25*  --    < >  _0 < > 18 16 23* 30*  CREATININE 1.65*   < > 2.34*  --    < > 1.86* 2.04* 1.88*   < > 1.84* 1.76* 1.8* 1.74*  CALCIUM 8.3*   < > 9.0  --    < > 8.3* 8.4* 8.8*   < > 8.9 9.3  --  9.1  MG 2.0  --  2.1  --   --   --   --  2.3  --   --   --   --   --   PHOS 2.8  --  2.8  --    < > 3.9 3.6 3.8  --   --   --   --   --   TSH 3.852   < >  --  138.414*  --   --   --   --   --   --   --  15.28* 0.39*   < > = values in this interval not displayed.   Liver Function Tests: Recent Labs    04/26/18 0439 04/27/18 0416  05/02/18 0432 05/03/18 0428 05/04/18 0333  AST 38 37  --   --   --  26  ALT 22 19  --   --   --  18  ALKPHOS 91 80  --   --   --  65  BILITOT 0.9 0.9   --   --   --  0.8  PROT 7.8 7.1  --   --   --  6.3*  ALBUMIN 4.4 3.9   < > 3.3* 3.7 3.4*   < > = values in this interval not displayed.   No results for input(s): LIPASE, AMYLASE in the last 8760 hours. No results for input(s): AMMONIA in the last 8760 hours. CBC: Recent Labs    01/05/18 1615  04/26/18 0503 04/27/18 0416  05/06/18 0323 05/07/18 0427 05/08/18 0419 05/15/18  WBC 6.5   < > 6.4 6.2   < > 5.5 4.6 4.9 5.2  NEUTROABS 4.3  --  3.2 3.4  --   --   --   --   --   HGB 13.0   < > 12.5* 12.4*   < > 11.5* 11.0* 11.2* 11.6*  HCT 39.7   < > 40.2 39.4   < > 35.7* 34.4* 35.9* 35*  MCV 93.6   < > 99.3 98.3   < > 96.7 97.7 98.4  --   PLT 275   < > 181 186   < > 167 165 167 175   < > = values in this interval not displayed.   Lipid Panel: No results for input(s): CHOL, HDL, LDLCALC, TRIG, CHOLHDL, LDLDIRECT in the last 8760 hours. TSH: Recent Labs    04/27/18 0749 05/15/18 06/27/18 1448  TSH 138.414* 15.28* 0.39*   A1C: Lab Results  Component Value Date   HGBA1C 5.7 (H) 01/28/2018     Assessment/Plan 1. Late onset Alzheimer's disease with behavioral disturbance [facility]) -therapist concerned with pt living at home however he lives with wife who although he demented able to help him. They have a lot of help coming in right now and son feels like they are in a safe environment. He does their medications and wants to respect his wishes to stay home. Looking into having more help come into the home as well.  - Amb Referral to Palliative Care -will stop namzaric as aricept may be adding to  diarrhea which started in nursing facility - memantine (NAMENDA XR) 7 MG CP24 24 hr capsule; As directed, 2 tablets for 1 week, 3 tablets for 1 week then 4 tablets  Dispense: 90 capsule; Refill: 0  2. Gastroesophageal reflux disease, esophagitis presence not specified Currently without GERD, will stop omeprazole at this time  3. Diarrhea, unspecified type ? Medication related, will stop aricept  and omeprazole at this time.  -will get stool for O& P, Culture and cdiff - CBC with Differential/Platelets - COMPLETE METABOLIC PANEL WITH GFR  4. Hypothyroidism due to acquired atrophy of thyroid -will follow up TSH, suspect we may need to reduce synthroid dose, question if he was actually taking at all before and now may be getting too much medication.  5. Essential hypertension -has not taken any medication this morning and blood pressure soft. Will stop hydralazine at this time and have son continue to monitor blood pressure.   Next appt: 08/09/2018 Carlos American. [place] [phone]

## 2018-07-13 ENCOUNTER — Other Ambulatory Visit: Payer: Self-pay

## 2018-07-13 DIAGNOSIS — E034 Atrophy of thyroid (acquired): Secondary | ICD-10-CM

## 2018-07-13 DIAGNOSIS — R197 Diarrhea, unspecified: Secondary | ICD-10-CM

## 2018-07-13 LAB — COMPLETE METABOLIC PANEL WITH GFR
AG RATIO: 1.2 (calc) (ref 1.0–2.5)
ALBUMIN MSPROF: 3.5 g/dL — AB (ref 3.6–5.1)
ALT: 14 U/L (ref 9–46)
AST: 13 U/L (ref 10–35)
Alkaline phosphatase (APISO): 118 U/L — ABNORMAL HIGH (ref 40–115)
BUN/Creatinine Ratio: 14 (calc) (ref 6–22)
BUN: 24 mg/dL (ref 7–25)
CALCIUM: 8.9 mg/dL (ref 8.6–10.3)
CO2: 33 mmol/L — AB (ref 20–32)
CREATININE: 1.66 mg/dL — AB (ref 0.70–1.11)
Chloride: 105 mmol/L (ref 98–110)
GFR, EST NON AFRICAN AMERICAN: 37 mL/min/{1.73_m2} — AB (ref >=60)
GFR, Est African American: 43 mL/min/{1.73_m2} — ABNORMAL LOW (ref >=60)
GLOBULIN: 2.9 g/dL (ref 1.9–3.7)
Glucose, Bld: 105 mg/dL (ref 65–139)
Potassium: 4.5 mmol/L (ref 3.5–5.3)
Sodium: 144 mmol/L (ref 135–146)
TOTAL PROTEIN: 6.4 g/dL (ref 6.1–8.1)
Total Bilirubin: 0.5 mg/dL (ref 0.2–1.2)

## 2018-07-13 LAB — CBC WITH DIFFERENTIAL/PLATELET
BASOS ABS: 11 {cells}/uL (ref 0–200)
Basophils Relative: 0.2 %
EOS PCT: 1.9 %
Eosinophils Absolute: 103 cells/uL (ref 15–500)
HCT: 29.9 % — ABNORMAL LOW (ref 38.5–50.0)
Hemoglobin: 9.8 g/dL — ABNORMAL LOW (ref 13.2–17.1)
Lymphs Abs: 1118 cells/uL (ref 850–3900)
MCH: 30.8 pg (ref 27.0–33.0)
MCHC: 32.8 g/dL (ref 32.0–36.0)
MCV: 94 fL (ref 80.0–100.0)
MONOS PCT: 8 %
MPV: 11.2 fL (ref 7.5–12.5)
NEUTROS PCT: 69.2 %
Neutro Abs: 3737 cells/uL (ref 1500–7800)
PLATELETS: 176 10*3/uL (ref 140–400)
RBC: 3.18 10*6/uL — ABNORMAL LOW (ref 4.20–5.80)
RDW: 12.1 % (ref 11.0–15.0)
TOTAL LYMPHOCYTE: 20.7 %
WBC mixed population: 432 cells/uL (ref 200–950)
WBC: 5.4 10*3/uL (ref 3.8–10.8)

## 2018-07-13 LAB — LIPID PANEL
Cholesterol: 87 mg/dL (ref <200)
HDL: 28 mg/dL — ABNORMAL LOW (ref >40)
LDL Cholesterol (Calc): 41 mg/dL (calc)
NON-HDL CHOLESTEROL (CALC): 59 mg/dL (ref <130)
Total CHOL/HDL Ratio: 3.1 (calc) (ref <5.0)
Triglycerides: 92 mg/dL (ref <150)

## 2018-07-13 LAB — TSH: TSH: 0.37 mIU/L — ABNORMAL LOW (ref 0.40–4.50)

## 2018-07-13 NOTE — Addendum Note (Signed)
Addended by: Chriss DriverLANE, Derika Eckles L on: 07/13/2018 03:42 PM   Modules accepted: Orders

## 2018-07-14 ENCOUNTER — Telehealth: Payer: Self-pay

## 2018-07-14 NOTE — Telephone Encounter (Signed)
Lurena JoinerRebecca from [facility] called to confirm our office received orders for patient to receive palliative care. Spoke with NP Abbey ChattersJessica Eubanks and she stated she spoke with patient son and put order in for Palliative Care, Lurena JoinerRebecca informed order has been put in.

## 2018-07-20 ENCOUNTER — Telehealth: Payer: Self-pay

## 2018-07-20 NOTE — Telephone Encounter (Signed)
Patient's son emailed information on Palliative Care services

## 2018-07-20 NOTE — Telephone Encounter (Signed)
Phone call placed to patient's son to schedule visit with Palliative Care. Visit scheduled for Wednesday 07/26/18

## 2018-07-25 ENCOUNTER — Telehealth: Payer: Self-pay | Admitting: Nurse Practitioner

## 2018-07-25 NOTE — Telephone Encounter (Signed)
I called the patient's son, Ardelia MemsRobb to reschedule the AWV/CPE appt on 08/09/18 at 1:00 to that morning at 8:30.  I had spoken to him earlier about that time, but explained that I needed to check with Shanda BumpsJessica to make sure it was ok.  I did get the ok from DudleyJessica, but there was no answer when I tried calling his son back.  So, I will try again later. VDM (DD)

## 2018-07-26 ENCOUNTER — Other Ambulatory Visit: Payer: Medicare Other | Admitting: Nurse Practitioner

## 2018-07-26 ENCOUNTER — Encounter: Payer: Self-pay | Admitting: Nurse Practitioner

## 2018-07-26 DIAGNOSIS — F32A Depression, unspecified: Secondary | ICD-10-CM

## 2018-07-26 DIAGNOSIS — I5032 Chronic diastolic (congestive) heart failure: Secondary | ICD-10-CM

## 2018-07-26 DIAGNOSIS — R269 Unspecified abnormalities of gait and mobility: Secondary | ICD-10-CM

## 2018-07-26 DIAGNOSIS — I4821 Permanent atrial fibrillation: Secondary | ICD-10-CM

## 2018-07-26 DIAGNOSIS — Z515 Encounter for palliative care: Secondary | ICD-10-CM

## 2018-07-26 DIAGNOSIS — G4733 Obstructive sleep apnea (adult) (pediatric): Secondary | ICD-10-CM

## 2018-07-26 DIAGNOSIS — F329 Major depressive disorder, single episode, unspecified: Secondary | ICD-10-CM

## 2018-07-26 DIAGNOSIS — Z86718 Personal history of other venous thrombosis and embolism: Secondary | ICD-10-CM

## 2018-07-26 DIAGNOSIS — F0281 Dementia in other diseases classified elsewhere with behavioral disturbance: Secondary | ICD-10-CM

## 2018-07-26 DIAGNOSIS — G309 Alzheimer's disease, unspecified: Secondary | ICD-10-CM

## 2018-07-26 DIAGNOSIS — J449 Chronic obstructive pulmonary disease, unspecified: Secondary | ICD-10-CM

## 2018-07-26 NOTE — Progress Notes (Signed)
[facility] Telephone: [phone] Fax: [phone]  PATIENT NAME: [name] [name] DOB: Jan 21, 1932 MRN: [id number]  PRIMARY CARE PROVIDER:   Lauree Chandler, NP  REFERRING PROVIDER:  Lauree Chandler, NP [place], [place] [address]   Lauree Chandler, NP as PCP - General (Geriatric Medicine) Nahser, [name], MD as Consulting Physician (Cardiology) [name], MD as Referring Physician (Urology)   RESPONSIBLE PARTY:   Lelon Ikard (son) [phone] (home and cell  HISTORY OF PRESENT ILLNESS:  [name] is a 84 y.o. year old male with multiple medical problems including Chronic hypoxic resp failure; oxygen dependent; OSA; CAD: s/p AVR;chronic dHF, h/o DVT, pAF,CKD, stage 3, dementia with behavioral disturbance, depression, insomnia Palliative Care was asked to help with symptom management and to address goals of care.   COLLATERAL INFORMATION: obtained from son Roboert Mundie: overall cognitive and physical decline (not walking as much)  over past year; lives with wife who has mental illness and dementia; doesn't cook much  ASSESSMENT/PLAN/RECOMMENDATIONS:  Dementia with behavioral disturbance  Depression insomnia -washes up , put on clothes and walks independently  -appetite is good, sleeps well at night -Namzaric 28/20m -continue supportive care; son does not feel that patient is at risk for wandering or safety risk in house.  COPD; oxygen dependent OSA Pulmonary nodule -continue spiriva, PRN Duoneb -none; patient denies any acute resp issues and says she has not used neb in many months  CAD;AoReg s/p CABG and AVR Chronic dHF H/o DVT A.Fib CKD, stage 3 GFR 30-39 BPH  -continue apixaban, atorvastatin,carvedilol,furosemide, K-Lor -continue same;patient taking furosemide and K-lor daily -K+ 4.5 and sCR 1.66  -Hypothyroidism -GERD -sleep disturbance -levothyroxine 2010ms (TSH 0.37) -mirtazapine 828mt bedtime -melatonin 5mg61mrecheck TSH  ACP -DNR -to f/u in 2-3 months to discuss MOST form   I spent 60 minutes providing this consultation,  from 13:00 to 14:00. More than 50% of the time in this consultation was spent coordinating communication.   CODE STATUS: DNR  PPS:70% HOSPICE ELIGIBILITY/DIAGNOSIS: TBD  PAST MEDICAL HISTORY:  Past Medical History:  Diagnosis Date  . Anxiety   . Aortic valve insufficiency   . Arthritis    "wee bit; knees, elbows" (10/15/2014)  . Atrial fibrillation (HCC)[place]. Cardiomyopathy (HCC)[place]. Chronic kidney disease    "down to ~ 1/2 of their regular use; I [place] kidney dr. in [place]/16/2016)  . Complication of anesthesia    unsure of complications but pt. states there were complications  . COPD (chronic obstructive pulmonary disease) (HCC)[place] on home o2  . Coronary artery disease   . Decreased testosterone level   . Depression   . Difficult intubation    difficult intubation 12/03/09 and 03/27/10 ([facility]); glidescope used 03/27/10  . DVT (deep venous thrombosis) (HCC)[place]2013   "BLE"  . Dysrhythmia    A. Fib  . Emphysema of lung (HCC)[place]. Enlarged prostate   . Falls frequently    > 20 times in the last year/notes 10/15/2014  . GERD (gastroesophageal reflux disease)   . Heart murmur   . History of blood transfusion 1960   "lots; related to an accident"  . History of echocardiogram    post AVR >> Echo 7/16:  Mild LVH, EF 20-25%, AVR ok (peak 15 mmHg, mean 9 mmHg), MAC, mild MR, severe LAE, mild reduced RVSF, mild RAE, PASP 35 mmHg  . Hypercholesteremia   . Hypertension   .  Hypothyroidism   . OSA (obstructive sleep apnea)    "suppose to wear mask; I throw it off in my sleep" (10/15/2014)  . Plantar fasciitis   . Right fibular fracture 10/15/2014  . Seasonal allergies   . Shortness of breath dyspnea   . Syncope 10/15/2014  . Syncope and collapse "several times"  . Urine frequency   . Urine incontinence   . Varicose veins     SOCIAL HX:  Social History    Tobacco Use  . Smoking status: Former Smoker    Packs/day: 2.00    Years: 22.00    Pack years: 44.00    Types: Cigarettes    Last attempt to quit: 04/29/1974    Years since quitting: 44.2  . Smokeless tobacco: Never Used  Substance Use Topics  . Alcohol use: Not Currently    Alcohol/week: 0.0 standard drinks    Comment: 1 beer per year     ALLERGIES:  Allergies  Allergen Reactions  . Haldol [Haloperidol Lactate]     Over-sedation and at times agitation      PERTINENT MEDICATIONS:  Outpatient Encounter Medications as of 07/26/2018  Medication Sig  . apixaban (ELIQUIS) 2.5 MG TABS tablet Take 1 tablet (2.5 mg total) by mouth 2 (two) times daily.  Marland Kitchen atorvastatin (LIPITOR) 10 MG tablet TAKE 1 TABLET BY MOUTH EVERY DAY  . carvedilol (COREG) 25 MG tablet Take 25 mg by mouth 2 (two) times daily with a meal.  . dutasteride (AVODART) 0.5 MG capsule TAKE 1 CAPSULE BY MOUTH EVERY DAY  . folic acid (FOLVITE) 1 MG tablet TAKE 1 TABLET BY MOUTH EVERY DAY  . furosemide (LASIX) 20 MG tablet TAKE 1 TABLET BY MOUTH DAILY FOR WEIGHT GAIN 3 POUNDS IN 24 HOURS  . ipratropium-albuterol (DUONEB) 0.5-2.5 (3) MG/3ML SOLN USE 1 VIAL IN NEBULIZER EVERY 4 HOURS AS NEEDED  . KLOR-CON M10 10 MEQ tablet TAKE 1 TABLET BY MOUTH EVERY DAY  . levothyroxine (SYNTHROID, LEVOTHROID) 200 MCG tablet TAKE 1 TABLET BY MOUTH EVERY DAY (ALONG WITH 25MCG TABLET)  . memantine (NAMENDA XR) 7 MG CP24 24 hr capsule As directed, 2 tablets for 1 week, 3 tablets for 1 week then 4 tablets  . mirtazapine (REMERON) 7.5 MG tablet Take 15 mg by mouth at bedtime.   . Tiotropium Bromide Monohydrate (SPIRIVA RESPIMAT) 2.5 MCG/ACT AERS 2 puffs daily   No facility-administered encounter medications on file as of 07/26/2018.     PHYSICAL EXAM:   General: NAD, obese Cardiovascular: regular rate and rhythm Pulmonary: clear ant fields Abdomen: soft, nontender, + bowel sounds GU: no suprapubic tenderness Extremities: 2+ ble edema,  no joint deformities Skin: no rashes Neurological: non-focal; oriented x3; thought process linear  Adi Seales G [place], NP

## 2018-07-26 NOTE — Telephone Encounter (Signed)
I spoke with the patient's son and we rescheduled his physical and labs to 09/04/2018. VDM (DD)

## 2018-08-02 ENCOUNTER — Telehealth: Payer: Self-pay | Admitting: *Deleted

## 2018-08-02 NOTE — Telephone Encounter (Signed)
Called and spoke with [name] [name] with [facility].   Patient is taking his Lasix daily. Son is filling pill boxes.   Does not have a scale to weigh on and none in home  No more redness in legs than usual. Pain has worsen.

## 2018-08-02 NOTE — Telephone Encounter (Signed)
Has he been taking the lasix daily? Also any increase in redness to legs or overall weight gain?  Diarrhea may have something to do with diet, to add probiotic if he has not already

## 2018-08-02 NOTE — Telephone Encounter (Signed)
Consuella LoseElaine with [facility] called and stated that she saw patient today and stated that patient complains of both legs continuing to hurt. Stated that they are hot, Itching and feels like pressure. Pain worse than usual and up most of the night. 1+ edema bilateral  Diarrhea has also restarted and he is having 1-2 loose stools/day.   Please Advise.

## 2018-08-03 NOTE — Telephone Encounter (Signed)
May need appt for further evaluation if this is very bothersome to him, please call son and see if he feels like this is needed.

## 2018-08-03 NOTE — Telephone Encounter (Signed)
Thank you :)

## 2018-08-03 NOTE — Telephone Encounter (Signed)
Called and spoke with son, Cleone SlimRob and he is going to contact his dad and evaluate what is going on and will call me back with an update.   Awaiting callback.

## 2018-08-08 MED ORDER — MEMANTINE HCL ER 28 MG PO CP24: 28.0000 mg | ORAL_CAPSULE | Freq: Every day | ORAL | 0 refills | Status: DC

## 2018-08-08 NOTE — Telephone Encounter (Signed)
done

## 2018-08-08 NOTE — Telephone Encounter (Signed)
Rob, son called and stated that patient's legs are the same and stated that patient stated that they are alittle better.   Also stated that patient has completed the Namenda titration pack and needs Rx sent to his pharmacy.   [facility]

## 2018-08-09 ENCOUNTER — Encounter: Payer: Medicare Other | Admitting: Nurse Practitioner

## 2018-08-09 ENCOUNTER — Ambulatory Visit: Payer: Medicare Other

## 2018-09-04 ENCOUNTER — Emergency Department (HOSPITAL_COMMUNITY): Payer: Medicare Other

## 2018-09-04 ENCOUNTER — Ambulatory Visit: Payer: Medicare Other | Admitting: Nurse Practitioner

## 2018-09-04 ENCOUNTER — Encounter: Payer: Medicare Other | Admitting: Nurse Practitioner

## 2018-09-04 ENCOUNTER — Ambulatory Visit: Payer: Medicare Other

## 2018-09-04 ENCOUNTER — Encounter (HOSPITAL_COMMUNITY): Payer: Self-pay | Admitting: *Deleted

## 2018-09-04 ENCOUNTER — Observation Stay (HOSPITAL_COMMUNITY)
Admission: EM | Admit: 2018-09-04 | Discharge: 2018-09-05 | Disposition: A | Discharge status: Home / Self Care | Payer: Medicare Other | Attending: Family Medicine | Admitting: Family Medicine

## 2018-09-04 ENCOUNTER — Other Ambulatory Visit: Payer: Self-pay

## 2018-09-04 DIAGNOSIS — I5032 Chronic diastolic (congestive) heart failure: Secondary | ICD-10-CM | POA: Diagnosis not present

## 2018-09-04 DIAGNOSIS — Z66 Do not resuscitate: Secondary | ICD-10-CM | POA: Insufficient documentation

## 2018-09-04 DIAGNOSIS — E7800 Pure hypercholesterolemia, unspecified: Secondary | ICD-10-CM | POA: Diagnosis not present

## 2018-09-04 DIAGNOSIS — N183 Chronic kidney disease, stage 3 unspecified: Secondary | ICD-10-CM | POA: Diagnosis present

## 2018-09-04 DIAGNOSIS — Z7901 Long term (current) use of anticoagulants: Secondary | ICD-10-CM | POA: Insufficient documentation

## 2018-09-04 DIAGNOSIS — G4733 Obstructive sleep apnea (adult) (pediatric): Secondary | ICD-10-CM | POA: Diagnosis not present

## 2018-09-04 DIAGNOSIS — Z6839 Body mass index (BMI) 39.0-39.9, adult: Secondary | ICD-10-CM | POA: Diagnosis not present

## 2018-09-04 DIAGNOSIS — E785 Hyperlipidemia, unspecified: Secondary | ICD-10-CM | POA: Diagnosis present

## 2018-09-04 DIAGNOSIS — N289 Disorder of kidney and ureter, unspecified: Secondary | ICD-10-CM

## 2018-09-04 DIAGNOSIS — Z86718 Personal history of other venous thrombosis and embolism: Secondary | ICD-10-CM | POA: Insufficient documentation

## 2018-09-04 DIAGNOSIS — I2510 Atherosclerotic heart disease of native coronary artery without angina pectoris: Secondary | ICD-10-CM | POA: Diagnosis not present

## 2018-09-04 DIAGNOSIS — D649 Anemia, unspecified: Secondary | ICD-10-CM | POA: Diagnosis present

## 2018-09-04 DIAGNOSIS — I447 Left bundle-branch block, unspecified: Secondary | ICD-10-CM | POA: Insufficient documentation

## 2018-09-04 DIAGNOSIS — J9611 Chronic respiratory failure with hypoxia: Secondary | ICD-10-CM | POA: Insufficient documentation

## 2018-09-04 DIAGNOSIS — R0789 Other chest pain: Secondary | ICD-10-CM

## 2018-09-04 DIAGNOSIS — K219 Gastro-esophageal reflux disease without esophagitis: Secondary | ICD-10-CM | POA: Diagnosis not present

## 2018-09-04 DIAGNOSIS — Z87891 Personal history of nicotine dependence: Secondary | ICD-10-CM | POA: Insufficient documentation

## 2018-09-04 DIAGNOSIS — E039 Hypothyroidism, unspecified: Secondary | ICD-10-CM | POA: Insufficient documentation

## 2018-09-04 DIAGNOSIS — J449 Chronic obstructive pulmonary disease, unspecified: Secondary | ICD-10-CM | POA: Diagnosis present

## 2018-09-04 DIAGNOSIS — E669 Obesity, unspecified: Secondary | ICD-10-CM | POA: Insufficient documentation

## 2018-09-04 DIAGNOSIS — Z952 Presence of prosthetic heart valve: Secondary | ICD-10-CM

## 2018-09-04 DIAGNOSIS — I259 Chronic ischemic heart disease, unspecified: Secondary | ICD-10-CM

## 2018-09-04 DIAGNOSIS — I429 Cardiomyopathy, unspecified: Secondary | ICD-10-CM | POA: Insufficient documentation

## 2018-09-04 DIAGNOSIS — Z9114 Patient's other noncompliance with medication regimen: Secondary | ICD-10-CM | POA: Insufficient documentation

## 2018-09-04 DIAGNOSIS — I4821 Permanent atrial fibrillation: Secondary | ICD-10-CM | POA: Insufficient documentation

## 2018-09-04 DIAGNOSIS — R740 Nonspecific elevation of levels of transaminase and lactic acid dehydrogenase [LDH]: Secondary | ICD-10-CM | POA: Diagnosis not present

## 2018-09-04 DIAGNOSIS — I130 Hypertensive heart and chronic kidney disease with heart failure and stage 1 through stage 4 chronic kidney disease, or unspecified chronic kidney disease: Secondary | ICD-10-CM | POA: Insufficient documentation

## 2018-09-04 DIAGNOSIS — Z953 Presence of xenogenic heart valve: Secondary | ICD-10-CM | POA: Diagnosis not present

## 2018-09-04 DIAGNOSIS — R079 Chest pain, unspecified: Secondary | ICD-10-CM | POA: Diagnosis not present

## 2018-09-04 DIAGNOSIS — F329 Major depressive disorder, single episode, unspecified: Secondary | ICD-10-CM | POA: Diagnosis not present

## 2018-09-04 DIAGNOSIS — F419 Anxiety disorder, unspecified: Secondary | ICD-10-CM | POA: Diagnosis not present

## 2018-09-04 DIAGNOSIS — I209 Angina pectoris, unspecified: Secondary | ICD-10-CM

## 2018-09-04 DIAGNOSIS — M1990 Unspecified osteoarthritis, unspecified site: Secondary | ICD-10-CM | POA: Insufficient documentation

## 2018-09-04 DIAGNOSIS — Z9981 Dependence on supplemental oxygen: Secondary | ICD-10-CM | POA: Insufficient documentation

## 2018-09-04 LAB — BASIC METABOLIC PANEL
ANION GAP: 9 (ref 5–15)
BUN: 27 mg/dL — ABNORMAL HIGH (ref 8–23)
CO2: 27 mmol/L (ref 22–32)
Calcium: 9 mg/dL (ref 8.9–10.3)
Chloride: 105 mmol/L (ref 98–111)
Creatinine, Ser: 1.78 mg/dL — ABNORMAL HIGH (ref 0.61–1.24)
GFR calc non Af Amer: 34 mL/min — ABNORMAL LOW (ref >60)
GFR, EST AFRICAN AMERICAN: 40 mL/min — AB (ref >60)
Glucose, Bld: 122 mg/dL — ABNORMAL HIGH (ref 70–99)
POTASSIUM: 4.2 mmol/L (ref 3.5–5.1)
Sodium: 141 mmol/L (ref 135–145)

## 2018-09-04 LAB — I-STAT TROPONIN, ED
TROPONIN I, POC: 0.01 ng/mL (ref 0.00–0.08)
Troponin i, poc: 0.01 ng/mL (ref 0.00–0.08)

## 2018-09-04 LAB — PROTIME-INR
INR: 1.24
Prothrombin Time: 15.5 seconds — ABNORMAL HIGH (ref 11.4–15.2)

## 2018-09-04 LAB — LIPASE, BLOOD: Lipase: 32 U/L (ref 11–51)

## 2018-09-04 LAB — TROPONIN I
Troponin I: <0.03 ng/mL (ref <0.03)
Troponin I: <0.03 ng/mL (ref <0.03)
Troponin I: <0.03 ng/mL (ref <0.03)

## 2018-09-04 LAB — HEPARIN LEVEL (UNFRACTIONATED)
Heparin Unfractionated: 0.3 IU/mL (ref 0.30–0.70)
Heparin Unfractionated: 0.4 IU/mL (ref 0.30–0.70)

## 2018-09-04 LAB — APTT
aPTT: 30 seconds (ref 24–36)
aPTT: 55 seconds — ABNORMAL HIGH (ref 24–36)

## 2018-09-04 LAB — CBC
HCT: 38.8 % — ABNORMAL LOW (ref 39.0–52.0)
HEMOGLOBIN: 11.5 g/dL — AB (ref 13.0–17.0)
MCH: 28.1 pg (ref 26.0–34.0)
MCHC: 29.6 g/dL — ABNORMAL LOW (ref 30.0–36.0)
MCV: 94.9 fL (ref 80.0–100.0)
Platelets: 181 10*3/uL (ref 150–400)
RBC: 4.09 MIL/uL — ABNORMAL LOW (ref 4.22–5.81)
RDW: 13.5 % (ref 11.5–15.5)
WBC: 6.8 10*3/uL (ref 4.0–10.5)
nRBC: 0 % (ref 0.0–0.2)

## 2018-09-04 LAB — HEPATIC FUNCTION PANEL
ALT: 58 U/L — AB (ref 0–44)
AST: 149 U/L — ABNORMAL HIGH (ref 15–41)
Albumin: 2.9 g/dL — ABNORMAL LOW (ref 3.5–5.0)
Alkaline Phosphatase: 160 U/L — ABNORMAL HIGH (ref 38–126)
Bilirubin, Direct: 0.6 mg/dL — ABNORMAL HIGH (ref 0.0–0.2)
Indirect Bilirubin: 0.6 mg/dL (ref 0.3–0.9)
Total Bilirubin: 1.2 mg/dL (ref 0.3–1.2)
Total Protein: 6.3 g/dL — ABNORMAL LOW (ref 6.5–8.1)

## 2018-09-04 MED ORDER — NITROGLYCERIN 0.4 MG SL SUBL
SUBLINGUAL_TABLET | SUBLINGUAL | Status: AC
  Administered 2018-09-04: 0.4 mg via SUBLINGUAL
  Filled 2018-09-04: qty 3

## 2018-09-04 MED ORDER — APIXABAN 2.5 MG PO TABS
2.5000 mg | ORAL_TABLET | Freq: Two times a day (BID) | ORAL | Status: DC
  Administered 2018-09-04 – 2018-09-05 (×2): 2.5 mg via ORAL
  Filled 2018-09-04 (×2): qty 1

## 2018-09-04 MED ORDER — ATORVASTATIN CALCIUM 10 MG PO TABS
10.0000 mg | ORAL_TABLET | Freq: Every day | ORAL | Status: DC
  Administered 2018-09-04 – 2018-09-05 (×2): 10 mg via ORAL
  Filled 2018-09-04 (×2): qty 1

## 2018-09-04 MED ORDER — UMECLIDINIUM BROMIDE 62.5 MCG/INH IN AEPB
1.0000 | INHALATION_SPRAY | Freq: Every day | RESPIRATORY_TRACT | Status: DC
  Administered 2018-09-05: 1 via RESPIRATORY_TRACT
  Filled 2018-09-04 (×2): qty 7

## 2018-09-04 MED ORDER — DONEPEZIL HCL 10 MG PO TABS
10.0000 mg | ORAL_TABLET | Freq: Every day | ORAL | Status: DC
  Administered 2018-09-04 – 2018-09-05 (×2): 10 mg via ORAL
  Filled 2018-09-04: qty 1
  Filled 2018-09-04: qty 2
  Filled 2018-09-04: qty 1

## 2018-09-04 MED ORDER — MORPHINE SULFATE (PF) 2 MG/ML IV SOLN: 2.0000 mg | INTRAVENOUS | Status: DC | PRN

## 2018-09-04 MED ORDER — ACETAMINOPHEN 325 MG PO TABS: 650.0000 mg | ORAL_TABLET | ORAL | Status: DC | PRN

## 2018-09-04 MED ORDER — HEPARIN (PORCINE) 25000 UT/250ML-% IV SOLN
1000.0000 [IU]/h | INTRAVENOUS | Status: DC
  Administered 2018-09-04: 1000 [IU]/h via INTRAVENOUS
  Filled 2018-09-04: qty 250

## 2018-09-04 MED ORDER — NITROGLYCERIN 0.4 MG SL SUBL: 0.4000 mg | SUBLINGUAL_TABLET | SUBLINGUAL | Status: DC | PRN

## 2018-09-04 MED ORDER — DUTASTERIDE 0.5 MG PO CAPS
0.5000 mg | ORAL_CAPSULE | Freq: Every day | ORAL | Status: DC
  Administered 2018-09-04 – 2018-09-05 (×2): 0.5 mg via ORAL
  Filled 2018-09-04 (×3): qty 1

## 2018-09-04 MED ORDER — FOLIC ACID 1 MG PO TABS
1.0000 mg | ORAL_TABLET | Freq: Every day | ORAL | Status: DC
  Administered 2018-09-04 – 2018-09-05 (×2): 1 mg via ORAL
  Filled 2018-09-04 (×2): qty 1

## 2018-09-04 MED ORDER — ALBUTEROL SULFATE (2.5 MG/3ML) 0.083% IN NEBU: 2.5000 mg | INHALATION_SOLUTION | RESPIRATORY_TRACT | Status: DC | PRN

## 2018-09-04 MED ORDER — LEVOTHYROXINE SODIUM 100 MCG PO TABS
200.0000 ug | ORAL_TABLET | Freq: Every day | ORAL | Status: DC
  Administered 2018-09-04 – 2018-09-05 (×2): 200 ug via ORAL
  Filled 2018-09-04: qty 1
  Filled 2018-09-04 (×2): qty 2

## 2018-09-04 MED ORDER — MEMANTINE HCL ER 28 MG PO CP24
28.0000 mg | ORAL_CAPSULE | Freq: Every day | ORAL | Status: DC
  Administered 2018-09-04 – 2018-09-05 (×2): 28 mg via ORAL
  Filled 2018-09-04 (×3): qty 1

## 2018-09-04 MED ORDER — MIRTAZAPINE 15 MG PO TABS
15.0000 mg | ORAL_TABLET | Freq: Every day | ORAL | Status: DC
  Administered 2018-09-04: 15 mg via ORAL
  Filled 2018-09-04: qty 1

## 2018-09-04 MED ORDER — ONDANSETRON HCL 4 MG/2ML IJ SOLN: 4.0000 mg | Freq: Four times a day (QID) | INTRAMUSCULAR | Status: DC | PRN

## 2018-09-04 MED ORDER — CARVEDILOL 25 MG PO TABS
25.0000 mg | ORAL_TABLET | Freq: Two times a day (BID) | ORAL | Status: DC
  Administered 2018-09-04 – 2018-09-05 (×3): 25 mg via ORAL
  Filled 2018-09-04: qty 2
  Filled 2018-09-04 (×2): qty 1

## 2018-09-04 MED ORDER — NITROGLYCERIN 0.4 MG SL SUBL
0.4000 mg | SUBLINGUAL_TABLET | SUBLINGUAL | Status: AC | PRN
  Administered 2018-09-04 (×3): 0.4 mg via SUBLINGUAL

## 2018-09-04 NOTE — H&P (Signed)
History and Physical    [name] [name] IHK:[id number] DOB: October 16, 1931 DOA: 09/04/2018  PCP: Lauree Chandler, NP  Patient coming from: Home.  Chief Complaint: Chest pain.  HPI: [name] [name] is a 85 y.o. male with history of permanent atrial fibrillation, diastolic CHF, status post bioprosthetic aortic valve replacement, history of CAD with RCA stenosis, sleep apnea, abdominal aortic aneurysm status post repair, hypertension, hyperlipidemia, dementia presents to the ER with complaint of chest pain.  Patient's chest pain started this midnight retrosternal nonradiating present even at rest.  No associated shortness of breath nausea vomiting palpitation.  Initially it lasted for 15 minutes was resolved with sublingual nitroglycerin came back again and at this time patient was brought to the ER.  ED Course: In the ER patient chest pain resolved with sublingual nitroglycerin second dose.  EKG shows atrial fibrillation with LBBB.  Cardiac markers were negative chest x-ray unremarkable.  On-call cardiologist was notified.  Patient admitted for further work-up of chest pain.  Review of Systems: As per HPI, rest all negative.   Past Medical History:  Diagnosis Date  . Anxiety   . Aortic valve insufficiency   . Arthritis    "wee bit; knees, elbows" (10/15/2014)  . Atrial fibrillation ([place])   . Cardiomyopathy ([place])   . Chronic kidney disease    "down to ~ 1/2 of their regular use; I see kidney dr. in [place]" (10/15/2014)  . Complication of anesthesia    unsure of complications but pt. states there were complications  . COPD (chronic obstructive pulmonary disease) ([place])    on home o2  . Coronary artery disease   . Decreased testosterone level   . Depression   . Difficult intubation    difficult intubation 12/03/09 and 03/27/10 ([facility]); glidescope used 03/27/10  . DVT (deep venous thrombosis) ([place]) ~ 2013   "BLE"  . Dysrhythmia    A. Fib  . Emphysema of lung ([place])   . Enlarged prostate   .  Falls frequently    > 20 times in the last year/notes 10/15/2014  . GERD (gastroesophageal reflux disease)   . Heart murmur   . History of blood transfusion 1960   "lots; related to an accident"  . History of echocardiogram    post AVR >> Echo 7/16:  Mild LVH, EF 20-25%, AVR ok (peak 15 mmHg, mean 9 mmHg), MAC, mild MR, severe LAE, mild reduced RVSF, mild RAE, PASP 35 mmHg  . Hypercholesteremia   . Hypertension   . Hypothyroidism   . OSA (obstructive sleep apnea)    "suppose to wear mask; I throw it off in my sleep" (10/15/2014)  . Plantar fasciitis   . Right fibular fracture 10/15/2014  . Seasonal allergies   . Shortness of breath dyspnea   . Syncope 10/15/2014  . Syncope and collapse "several times"  . Urine frequency   . Urine incontinence   . Varicose veins     Past Surgical History:  Procedure Laterality Date  . ABDOMINAL AORTIC ANEURYSM REPAIR  01/2006; 03/10/2006   Archie Endo 01/12/2011  . AORTIC VALVE REPLACEMENT N/A 02/11/2015   Procedure: AORTIC VALVE REPLACEMENT (AVR);  Surgeon: Grace Isaac, MD;  Location: [facility];  Service: Open Heart Surgery;  Laterality: N/A;  . BRAIN SURGERY  1960 X 4   "S/P got my skull busted"; pt. states removed internal carotid artery  . CARDIAC CATHETERIZATION  1990's X 1; 09/2014   no stents  . CLIPPING OF ATRIAL APPENDAGE N/A 02/11/2015  Procedure: CLIPPING OF ATRIAL APPENDAGE;  Surgeon: Grace Isaac, MD;  Location: [facility];  Service: Open Heart Surgery;  Laterality: N/A;  . ERCP  11/2009   Archie Endo 12/05/2009  . gall stone    . HERNIA REPAIR    . LAPAROSCOPIC CHOLECYSTECTOMY  08/2009   w/IOC/notes 09/18/2009  . LAPAROSCOPIC INCISIONAL / UMBILICAL / VENTRAL HERNIA REPAIR  02/2010   VHR w/mesh/notes 03/28/2010  . NOSE SURGERY    . TEE WITHOUT CARDIOVERSION N/A 02/11/2015   Procedure: TRANSESOPHAGEAL ECHOCARDIOGRAM (TEE);  Surgeon: Grace Isaac, MD;  Location: [facility];  Service: Open Heart Surgery;  Laterality: N/A;  . TONSILLECTOMY        reports that he quit smoking about 44 years ago. His smoking use included cigarettes. He has a 44.00 pack-year smoking history. He has never used smokeless tobacco. He reports previous alcohol use. He reports that he does not use drugs.  Allergies  Allergen Reactions  . Haldol [Haloperidol Lactate]     Over-sedation and at times agitation     Family History  Problem Relation Age of Onset  . Rheum arthritis Mother   . Diabetes Mother   . Heart disease Father   . Rheum arthritis Father   . Kidney disease Father   . Other Sister        Murdered  . Breast cancer Sister   . Colon cancer Neg Hx   . Colon polyps Neg Hx   . Liver disease Neg Hx     Prior to Admission medications   Medication Sig Start Date End Date Taking? Authorizing Provider  apixaban (ELIQUIS) 2.5 MG TABS tablet Take 1 tablet (2.5 mg total) by mouth 2 (two) times daily. 02/21/18  Yes Nahser, [name], MD  atorvastatin (LIPITOR) 10 MG tablet TAKE 1 TABLET BY MOUTH EVERY DAY Patient taking differently: Take 10 mg by mouth daily.  07/03/18  Yes Lauree Chandler, NP  carvedilol (COREG) 25 MG tablet Take 25 mg by mouth 2 (two) times daily with a meal.   Yes [provider]  dutasteride (AVODART) 0.5 MG capsule TAKE 1 CAPSULE BY MOUTH EVERY DAY Patient taking differently: Take 0.5 mg by mouth daily.  07/03/18  Yes Lauree Chandler, NP  folic acid (FOLVITE) 1 MG tablet TAKE 1 TABLET BY MOUTH EVERY DAY Patient taking differently: Take 1 mg by mouth daily.  07/03/18  Yes Lauree Chandler, NP  furosemide (LASIX) 20 MG tablet TAKE 1 TABLET BY MOUTH DAILY FOR WEIGHT GAIN 3 POUNDS IN 24 HOURS Patient taking differently: Take 20 mg by mouth daily as needed for fluid (weight gain of 3 lbs in 24 hours).  06/29/18  Yes Eubanks, Carlos American, NP  ipratropium-albuterol (DUONEB) 0.5-2.5 (3) MG/3ML SOLN USE 1 VIAL IN NEBULIZER EVERY 4 HOURS AS NEEDED Patient taking differently: Inhale 3 mLs into the lungs every 4 (four) hours as  needed (SOB).  06/29/18  Yes Eubanks, Carlos American, NP  KLOR-CON M10 10 MEQ tablet TAKE 1 TABLET BY MOUTH EVERY DAY Patient taking differently: Take 10 mEq by mouth daily.  07/03/18  Yes Lauree Chandler, NP  levothyroxine (SYNTHROID, LEVOTHROID) 200 MCG tablet TAKE 1 TABLET BY MOUTH EVERY DAY (ALONG WITH 25MCG TABLET) Patient taking differently: Take 200 mcg by mouth daily before breakfast.  07/03/18  Yes Lauree Chandler, NP  Memantine HCl-Donepezil HCl (NAMZARIC PO) Take 1 tablet by mouth daily.   Yes [provider]  mirtazapine (REMERON) 7.5 MG tablet Take 15  mg by mouth at bedtime.  10/13/15  Yes [provider]  Tiotropium Bromide Monohydrate (SPIRIVA RESPIMAT) 2.5 MCG/ACT AERS 2 puffs daily Patient taking differently: Inhale 2 puffs into the lungs daily.  06/27/18  Yes Lauree Chandler, NP  memantine (NAMENDA XR) 28 MG CP24 24 hr capsule Take 1 capsule (28 mg total) by mouth daily. Patient not taking: Reported on 09/04/2018 08/08/18   Lauree Chandler, NP    Physical Exam: Vitals:   09/04/18 0115 09/04/18 0200 09/04/18 0300 09/04/18 0400  BP: 134/81 132/89 132/85 104/71  Pulse: 96 (!) 101 84 92  Resp: (!) 21 19 (!) 25 19  Temp:      SpO2: 100% 100% 100% 98%      Constitutional: Moderately built and nourished. Vitals:   09/04/18 0115 09/04/18 0200 09/04/18 0300 09/04/18 0400  BP: 134/81 132/89 132/85 104/71  Pulse: 96 (!) 101 84 92  Resp: (!) 21 19 (!) 25 19  Temp:      SpO2: 100% 100% 100% 98%   Eyes: Anicteric no pallor. ENMT: No discharge from the ears eyes nose or mouth. Neck: No mass felt.  No neck rigidity.  No JVD appreciated. Respiratory: No rhonchi or crepitations. Cardiovascular: S1-S2 heard no murmurs appreciated. Abdomen: Soft nontender bowel sounds present. Musculoskeletal: No edema.  No joint effusion. Skin: Chronic skin changes. Neurologic: Alert awake oriented to time place and person.  Moves all extremities. Psychiatric: Appears  normal per normal affect.   Labs on Admission: I have personally reviewed following labs and imaging studies  CBC: Recent Labs  Lab 09/04/18 0105  WBC 6.8  HGB 11.5*  HCT 38.8*  MCV 94.9  PLT 734   Basic Metabolic Panel: Recent Labs  Lab 09/04/18 0105  NA 141  K 4.2  CL 105  CO2 27  GLUCOSE 122*  BUN 27*  CREATININE 1.78*  CALCIUM 9.0   GFR: CrCl cannot be calculated (Unknown ideal weight.). Liver Function Tests: No results for input(s): AST, ALT, ALKPHOS, BILITOT, PROT, ALBUMIN in the last 168 hours. No results for input(s): LIPASE, AMYLASE in the last 168 hours. No results for input(s): AMMONIA in the last 168 hours. Coagulation Profile: No results for input(s): INR, PROTIME in the last 168 hours. Cardiac Enzymes: No results for input(s): CKTOTAL, CKMB, CKMBINDEX, TROPONINI in the last 168 hours. BNP (last 3 results) No results for input(s): PROBNP in the last 8760 hours. HbA1C: No results for input(s): HGBA1C in the last 72 hours. CBG: No results for input(s): GLUCAP in the last 168 hours. Lipid Profile: No results for input(s): CHOL, HDL, LDLCALC, TRIG, CHOLHDL, LDLDIRECT in the last 72 hours. Thyroid Function Tests: No results for input(s): TSH, T4TOTAL, FREET4, T3FREE, THYROIDAB in the last 72 hours. Anemia Panel: No results for input(s): VITAMINB12, FOLATE, FERRITIN, TIBC, IRON, RETICCTPCT in the last 72 hours. Urine analysis:    Component Value Date/Time   COLORURINE AMBER (A) 04/26/2018 0442   APPEARANCEUR HAZY (A) 04/26/2018 0442   LABSPEC 1.033 (H) 04/26/2018 0442   PHURINE 5.0 04/26/2018 0442   GLUCOSEU NEGATIVE 04/26/2018 0442   HGBUR NEGATIVE 04/26/2018 0442   BILIRUBINUR MODERATE (A) 04/26/2018 0442   KETONESUR 5 (A) 04/26/2018 0442   PROTEINUR 30 (A) 04/26/2018 0442   UROBILINOGEN 2.0 (H) 02/21/2015 1241   NITRITE NEGATIVE 04/26/2018 0442   LEUKOCYTESUR TRACE (A) 04/26/2018 0442   Sepsis  Labs: _0 (procalcitonin:4,lacticidven:4) )No results found for this or any previous visit (from the past 240 hour(s)).   Radiological  Exams on Admission: Dg Chest 2 View  Result Date: 09/04/2018 CLINICAL DATA:  Shortness of breath. EXAM: CHEST - 2 VIEW COMPARISON:  05/04/2018 FINDINGS: Post median sternotomy. Stable cardiomegaly and prosthetic aortic valve. Prior left atrial clipping. Similar vascular congestion to prior exam. Right basilar scarring. No large pleural effusion or pneumothorax. IMPRESSION: 1. Unchanged cardiomegaly and vascular congestion compared to exam 4 months ago. 2. Right basilar scarring. Electronically Signed   By: Keith Rake M.D.   On: 09/04/2018 01:33    EKG: Independently reviewed.  A. fib LBBB.  Assessment/Plan Principal Problem:   Chest pain Active Problems:   Hyperlipidemia, unspecified   Obstructive sleep apnea   COPD (chronic obstructive pulmonary disease) with chronic bronchitis (HCC)   CKD (chronic kidney disease) stage 3, GFR 30-59 ml/min ([facility])   Permanent atrial fibrillation   S/P AVR (aortic valve replacement)    1. Chest pain with history of CAD with occluded RCA -concerning for unstable angina.  We will keep patient n.p.o. cycle cardiac markers continue home medications including Lipitor Coreg and patient is on apixaban which will be held and place patient on heparin.  Cardiology notified. 2. Chronic diastolic CHF last EF measured in June 2019 showed normal EF.  Appears compensated. 3. Permanent atrial fibrillation presently in A. fib.  Heart rate controlled on beta-blockers.  Keep patient on heparin anticipation of possible cardiac procedure. 4. Chronic kidney disease stage III creatinine appears to be at baseline. 5. Hyperlipidemia on statins. 6. Hypertension on Coreg. 7. COPD on home oxygen and uses inhaler. 8. Sleep apnea has not been using his CPAP for many years. 9. History of dementia.   DVT prophylaxis: Heparin. Code  Status: DNR which is confirmed with patient's son. Family Communication: Patient's son. Disposition Plan: Home. Consults called: Cardiology. Admission status: Observation.   Rise Patience MD [facility] Pager [phone].  If 7PM-7AM, please contact night-coverage [email or web address] Password [facility]  09/04/2018, 5:43 AM

## 2018-09-04 NOTE — ED Notes (Signed)
EDP at bedside  

## 2018-09-04 NOTE — ED Triage Notes (Signed)
Pt from home c/o chest pain while watching TV with increased sob and diaphoresis. Fire gave 324mg  ASA, chest pain has since resolved. Pt on 3L home oxygen. Hx of CHF, COPD. EMS ekg showing afib with LBBB.

## 2018-09-04 NOTE — Progress Notes (Signed)
PROGRESS NOTE  Brief Narrative: [name] [name] is an 85 y.o. male with a history of permanent AFib, chronic dCHF, AR s/p bioprosthetic AVR, CAD with distal RCA stenosis, chronic LBBB, OSA, AAA s/p repair, HTN, HLD, and progressive memory decline on namenda who presented with chest pain, admitted this morning for ACS evaluation. Cardiac enzymes have been negative so far, though he did have chest pain in the ED.  Subjective: Right lower leg is unchanged from chronic redness, bilateral swelling stable, no pain reported.   Objective: BP (!) 125/96 (BP Location: Left Arm)   Pulse 80   Temp 97.8 F (36.6 C) (Oral)   Resp 16   Ht 6' (1.829 m)   Wt 131.5 kg   SpO2 100%   BMI 39.33 kg/m   Gen: Elderly pleasant, confused male Pulm: Clear and nonlabored on supplemental oxygen.  CV: RRR, I/VI soft systolic murmur at base, no JVD  Ext/Skin: RLE with lower limb edema, pitting. Splotchy erythema without induration or fluctuance, NOT tender. Scabbed excoriations anteriorly. No leg hair noted.   Assessment & Plan: Chest pain with known RCA CAD: Admitted this AM for ACS evaluation. Continuing to trend cardiac enzymes. Cardiology planning to reevaluate in the morning. With CKD, poor functional status, negative enzymes, and atypical features to history, they are not inclined to pursue catheterization at this time.  - DC heparin, restart eliquis - Continue coreg and lipitor  I have reviewed and agree with assessment and plan as dictated in H&P this morning by Dr. Toniann Fail.  Tyrone Nine, MD 09/04/2018, 5:47 PM

## 2018-09-04 NOTE — Consult Note (Addendum)
Cardiology Consultation:   Patient ID: [name] [name] MRN: [id number]; DOB: 02-Feb-1932  Admit date: 09/04/2018 Date of Consult: 09/04/2018  Primary Care Provider: Lauree Chandler, NP Primary Cardiologist: Mertie Moores, MD   Patient Profile:   [name] [name] is a 85 y.o. male with a hx of aortic insufficiency s/p AVR in 2016, chronic AFib s/p clipping in 2016, CAD with RCA disease, Chronic systolic heart failure, HTN, HL, OSA on CPAP (non compliant), AAA s/p repair in 2007 by Dr. Scot Dock, chronic LBBB, hypothyroidism, COPD, CKD stage III, s/p traumatic brain injury with craniotomy in 1960 and  hx of DVT (2013), who is being seen today for the evaluation of chest pain at the request of Dr. Bonner Puna.  He underwent bioprosthetic AVR and clipping of the left atrial appendage 01/2015. Bilateral saphenous veins were unsuitable for grafting. She has 70% distal RCA.   Last echocardiogram June 2019 showed LV function of 60 to 65%, normal functioning aortic wall, severely dilated left atrium, mild mitral regurgitation, ascending aortic diameter 42 mmHg.  Last seen 04/2018 for sinus pause when admitted with acute respiratory failure. Held BB temporary.   History of Present Illness:   [name] [name] presented for evaluation of epigastric/abdominal pain.  He had intermittent epigastric sharp pain starting yesterday morning at 10 AM.  Each episode lasted 15 minutes with self resolution.  Pain exacerbated and EMS was called.  Per ER note and H&P, patient had anterior lower sternal chest pain which did not opacify by patient today.  Patient has baseline dementia.  He lives with wife who has severe Alzheimer's disease.  He misses his medication occasionally.  Unsure last dose of Eliquis.  He was admitted for further evaluation.  He has chronic dyspnea and orthopnea which is multifactorial due to obesity.  No exercise or ambulation.  He does not even walk to his mailbox.  Does not check his weight daily.  The mid and lower  extremity edema and right lower leg has erythema for past 3 to 4 months.  In ER, blood pressure relatively stable.  Point-of-care troponin negative x2.  Troponin I negative 1.  Albumin 2.9.  Creatinine 1.78 which is his baseline.  Elevated AST/ALT with normal lipase.  Hemoglobin 11.5.  INR 1.24.  Chest x-ray without relatively acute findings.  EKG shows atrial fibrillation with chronic left bundle branch block at rate of 95 bpm-personally reviewed.  Telemetry shows atrial fibrillation at rate of 80s-personally reviewed.  He is started on IV heparin by attending team.  Was given sublingual nitroglycerin x3 this morning in ER.  Patient is unsure if this helped or not.   Past Medical History:  Diagnosis Date  . Anxiety   . Aortic valve insufficiency   . Arthritis    "wee bit; knees, elbows" (10/15/2014)  . Atrial fibrillation ([place])   . Cardiomyopathy ([place])   . Chronic kidney disease    "down to ~ 1/2 of their regular use; I see kidney dr. in [place]" (10/15/2014)  . Complication of anesthesia    unsure of complications but pt. states there were complications  . COPD (chronic obstructive pulmonary disease) ([place])    on home o2  . Coronary artery disease   . Decreased testosterone level   . Depression   . Difficult intubation    difficult intubation 12/03/09 and 03/27/10 ([facility]); glidescope used 03/27/10  . DVT (deep venous thrombosis) ([place]) ~ 2013   "BLE"  . Dysrhythmia    A. Fib  . Emphysema of lung ([place])   .  Enlarged prostate   . Falls frequently    > 20 times in the last year/notes 10/15/2014  . GERD (gastroesophageal reflux disease)   . Heart murmur   . History of blood transfusion 1960   "lots; related to an accident"  . History of echocardiogram    post AVR >> Echo 7/16:  Mild LVH, EF 20-25%, AVR ok (peak 15 mmHg, mean 9 mmHg), MAC, mild MR, severe LAE, mild reduced RVSF, mild RAE, PASP 35 mmHg  . Hypercholesteremia   . Hypertension   . Hypothyroidism   . OSA (obstructive  sleep apnea)    "suppose to wear mask; I throw it off in my sleep" (10/15/2014)  . Plantar fasciitis   . Right fibular fracture 10/15/2014  . Seasonal allergies   . Shortness of breath dyspnea   . Syncope 10/15/2014  . Syncope and collapse "several times"  . Urine frequency   . Urine incontinence   . Varicose veins     Past Surgical History:  Procedure Laterality Date  . ABDOMINAL AORTIC ANEURYSM REPAIR  01/2006; 03/10/2006   Archie Endo 01/12/2011  . AORTIC VALVE REPLACEMENT N/A 02/11/2015   Procedure: AORTIC VALVE REPLACEMENT (AVR);  Surgeon: Grace Isaac, MD;  Location: [facility];  Service: Open Heart Surgery;  Laterality: N/A;  . BRAIN SURGERY  1960 X 4   "S/P got my skull busted"; pt. states removed internal carotid artery  . CARDIAC CATHETERIZATION  1990's X 1; 09/2014   no stents  . CLIPPING OF ATRIAL APPENDAGE N/A 02/11/2015   Procedure: CLIPPING OF ATRIAL APPENDAGE;  Surgeon: Grace Isaac, MD;  Location: [facility];  Service: Open Heart Surgery;  Laterality: N/A;  . ERCP  11/2009   Archie Endo 12/05/2009  . gall stone    . HERNIA REPAIR    . LAPAROSCOPIC CHOLECYSTECTOMY  08/2009   w/IOC/notes 09/18/2009  . LAPAROSCOPIC INCISIONAL / UMBILICAL / VENTRAL HERNIA REPAIR  02/2010   VHR w/mesh/notes 03/28/2010  . NOSE SURGERY    . TEE WITHOUT CARDIOVERSION N/A 02/11/2015   Procedure: TRANSESOPHAGEAL ECHOCARDIOGRAM (TEE);  Surgeon: Grace Isaac, MD;  Location: [facility];  Service: Open Heart Surgery;  Laterality: N/A;  . TONSILLECTOMY       Inpatient Medications: Scheduled Meds: . atorvastatin  10 mg Oral Daily  . carvedilol  25 mg Oral BID WC  . donepezil  10 mg Oral Daily   And  . memantine  28 mg Oral Daily  . dutasteride  0.5 mg Oral Daily  . folic acid  1 mg Oral Daily  . levothyroxine  200 mcg Oral QAC breakfast  . mirtazapine  15 mg Oral QHS  . umeclidinium bromide  1 puff Inhalation Daily   Continuous Infusions: . heparin 1,000 Units/hr (09/04/18 0726)   PRN  Meds: acetaminophen, albuterol, morphine injection, nitroGLYCERIN, ondansetron (ZOFRAN) IV  Allergies:    Allergies  Allergen Reactions  . Haldol [Haloperidol Lactate]     Over-sedation and at times agitation     Social History:   Social History   Socioeconomic History  . Marital status: Married    Spouse name: Joycelyn Schmid  . Number of children: 1  . Years of education: Master's  . Highest education level: Not on file  Occupational History  . Not on file  Social Needs  . Financial resource strain: Not on file  . Food insecurity:    Worry: Not on file    Inability: Not on file  . Transportation needs:    Medical:  Not on file    Non-medical: Not on file  Tobacco Use  . Smoking status: Former Smoker    Packs/day: 2.00    Years: 22.00    Pack years: 44.00    Types: Cigarettes    Last attempt to quit: 04/29/1974    Years since quitting: 44.3  . Smokeless tobacco: Never Used  Substance and Sexual Activity  . Alcohol use: Not Currently    Alcohol/week: 0.0 standard drinks    Comment: 1 beer per year   . Drug use: No  . Sexual activity: Not Currently  Lifestyle  . Physical activity:    Days per week: Not on file    Minutes per session: Not on file  . Stress: Not on file  Relationships  . Social connections:    Talks on phone: Not on file    Gets together: Not on file    Attends religious service: Not on file    Active member of club or organization: Not on file    Attends meetings of clubs or organizations: Not on file    Relationship status: Not on file  . Intimate partner violence:    Fear of current or ex partner: Not on file    Emotionally abused: Not on file    Physically abused: Not on file    Forced sexual activity: Not on file  Other Topics Concern  . Not on file  Social History Narrative   Social History      Diet?       Do you drink/eat things with caffeine? Some diet coke      Marital status?                married                    What year  were you married? 1962      Do you live in a house, apartment, assisted living, condo, trailer, etc.? condo      Is it one or more stories? Yes- do not use upstairs      How many persons live in your home? 2      Do you have any pets in your home? (please list) no      Highest level of education completed? Masters      Current or past profession: Brewing technologist      Do you exercise?        no                              Type & how often? n/a      Advanced Directives      Do you have a living will? no      Do you have a DNR form?       no                           If not, do you want to discuss one? yes      Do you have signed POA/HPOA for forms? No- has durable POA      Functional Status      Do you have difficulty bathing or dressing yourself? difficulty      Do you have difficulty preparing food or eating?  Heat up food- do not cook      Do you have difficulty managing your medications? Son puts  in containers      Do you have difficulty managing your finances? Son is doing      Do you have difficulty affording your medications? no           Family History:   Family History  Problem Relation Age of Onset  . Rheum arthritis Mother   . Diabetes Mother   . Heart disease Father   . Rheum arthritis Father   . Kidney disease Father   . Other Sister        Murdered  . Breast cancer Sister   . Colon cancer Neg Hx   . Colon polyps Neg Hx   . Liver disease Neg Hx      ROS:  Please see the history of present illness.  All other ROS reviewed and negative.     Physical Exam/Data:   Vitals:   09/04/18 0915 09/04/18 0930 09/04/18 0945 09/04/18 1000  BP:    (!) 141/89  Pulse: 79 83 85 88  Resp: (!) 23 20 (!) 21 19  Temp:      SpO2: 100% 100% 99% 100%   No intake or output data in the 24 hours ending 09/04/18 1234 There were no vitals filed for this visit. There is no height or weight on file to calculate BMI.  General: Obese male in no acute  distress HEENT: normal Lymph: no adenopathy Neck: no JVD Endocrine:  No thryomegaly Vascular: No carotid bruits; FA pulses 2+ bilaterally without bruits  Cardiac:  normal S1, S2; RRR; no murmur Lungs:  clear to auscultation bilaterally, no wheezing, rhonchi or rales  Abd: soft, nontender, no hepatomegaly  Ext: Trace right lower extremity edema with mild erythema Musculoskeletal:  No deformities, BUE and BLE strength normal and equal Skin: warm and dry  Neuro:  CNs 2-12 intact, no focal abnormalities noted Psych:  Normal affect   Relevant CV Studies:  Echo 01/28/2018 Study Conclusions  - Left ventricle: The cavity size was normal. Systolic function was   normal. The estimated ejection fraction was in the range of 60%   to 65%. Wall motion was normal; there were no regional wall   motion abnormalities. Doppler parameters are consistent with high   ventricular filling pressure. - Aortic valve: A prosthesis was present and functioning normally.   The prosthesis had a normal range of motion. The sewing ring   appeared normal, had no rocking motion, and showed no evidence of   dehiscence. Peak velocity (S): 254 cm/s. Mean gradient (S): 13 mm   Hg. Valve area (VTI): 1.91 cm^2. Valve area (Vmax): 1.8 cm^2.   Valve area (Vmean): 1.94 cm^2. - Aorta: Ascending aortic diameter: 42 mm (S). - Ascending aorta: The ascending aorta was mildly dilated. - Mitral valve: There was mild regurgitation. - Left atrium: The atrium was severely dilated. - Right ventricle: The cavity size was moderately dilated. Wall   thickness was normal. - Right atrium: The atrium was severely dilated. - Pulmonary arteries: Systolic pressure was mildly increased.  Impressions:  - EF is improved when compared to prior.   Laboratory Data:  Chemistry Recent Labs  Lab 09/04/18 0105  NA 141  K 4.2  CL 105  CO2 27  GLUCOSE 122*  BUN 27*  CREATININE 1.78*  CALCIUM 9.0  GFRNONAA 34*  GFRAA 40*  ANIONGAP  9    Recent Labs  Lab 09/04/18 0353  PROT 6.3*  ALBUMIN 2.9*  AST 149*  ALT 58*  ALKPHOS 160*  BILITOT 1.2  Hematology Recent Labs  Lab 09/04/18 0105  WBC 6.8  RBC 4.09*  HGB 11.5*  HCT 38.8*  MCV 94.9  MCH 28.1  MCHC 29.6*  RDW 13.5  PLT 181   Cardiac Enzymes Recent Labs  Lab 09/04/18 0353  TROPONINI <0.03    Recent Labs  Lab 09/04/18 0106 09/04/18 0355  TROPIPOC 0.01 0.01    Radiology/Studies:  Dg Chest 2 View  Result Date: 09/04/2018 CLINICAL DATA:  Shortness of breath. EXAM: CHEST - 2 VIEW COMPARISON:  05/04/2018 FINDINGS: Post median sternotomy. Stable cardiomegaly and prosthetic aortic valve. Prior left atrial clipping. Similar vascular congestion to prior exam. Right basilar scarring. No large pleural effusion or pneumothorax. IMPRESSION: 1. Unchanged cardiomegaly and vascular congestion compared to exam 4 months ago. 2. Right basilar scarring. Electronically Signed   By: Keith Rake M.D.   On: 09/04/2018 01:33    Assessment and Plan:   1. Epigastric/anterior chest pain History difficult to obtain due to baseline dementia.  Patient denies any chest pain however ER and attending provider's note mention anterior chest pain.  He got 3 sublingual nitroglycerin this morning in the emergency room.  Unsure if this helped or not. -Troponin negative so far.  EKG without acute ischemic changes. -He does have history of CAD with RCA disease which treated medically (Bilateral saphenous veins were unsuitable for grafting). -Likely medical management going forward.  Dr. Burt Knack to see later today.  2.  Status post bioprosthetic aortic valve in 2016 -Normal functioning valve by echocardiogram June 2019.  No significant murmur heard.  3.  Perminant atrial fibrillation -Rate controlled on Coreg 25 mg twice daily. CHA2DS2VASc =7 (age, CHF, hypertension, thromboembolism, vascular disease).  Home Eliquis 2.5 mg twice daily on hold.  Currently on IV heparin.  He was  intermittently noncompliant with his medication at home.  4.  Transaminitis -Will defer evaluation per primary team  5.  Chronic kidney disease stage III -Creatinine stable at 1.7.  6.  Hypertension -Blood pressure stable on current medication  7.  Dementia -Per primary team  8.  Possible right lower extremity cellulitis -We will defer to primary team  For questions or updates, please contact [place] Please consult [email or web address] for contact info under   Jarrett Soho, PA  09/04/2018 12:34 PM   Patient seen, examined. Available data reviewed. Agree with findings, assessment, and plan as outlined by Robbie Lis, PA.  The patient's son and daughter-in-law are at the bedside.  He presents with somewhat vague pain in the lower chest and epigastrium.  States the pain occurred on and off much of the day yesterday.  His history is reviewed and he underwent bioprosthetic aortic valve replacement in 2016.  His preoperative heart catheterization demonstrated single-vessel coronary disease involving the RCA.  It does not appear that the RCA was grafted based on poor saphenous vein quality.  He did undergo clipping of the left atrial appendage at the time of his surgery.  The patient has had significant cognitive decline over the last several months per his family.  He lives independently at home with his wife who also has dementia.  On my exam, the patient is pleasant and alert, in no distress.  He is an elderly, overweight male.  Lung fields are clear, heart is irregularly irregular without murmur or gallop, JVP is normal, abdomen is soft and nontender, extremities show no edema, but there is some erythema and rash of the right lower extremity.  This is apparently chronic.  The  patient has a baseline left bundle branch block and permanent atrial fibrillation.  He has been anticoagulated with apixaban.  His cardiac biomarkers are negative to date.  Essentially this patient is an  elderly male with atypical chest/epigastric discomfort.  His cardiac markers are negative x2.  He has very poor functional capacity, progressive cognitive decline, and multiple comorbid medical conditions including chronic kidney disease.  I do not think he would be a good candidate for invasive cardiac evaluation unless he clearly declared himself as having acute coronary syndrome with positive markers and much more typical symptoms.  I would not recommend any specific cardiac testing at this point unless he has recurrent symptoms more suggestive of ischemic heart disease.  As he has ruled out for MI, heparin could be discontinued and he could be started back on apixaban unless there are other considerations for noncardiac testing.  We will follow-up with him in the morning to make sure that he has not had chest discomfort.    Sherren Mocha, M.D. 09/04/2018 1:49 PM

## 2018-09-04 NOTE — ED Notes (Signed)
Admitting at bedside 

## 2018-09-04 NOTE — Progress Notes (Signed)
ANTICOAGULATION CONSULT NOTE - Initial Consult  Pharmacy Consult for Heparin Indication: atrial fibrillation  Allergies  Allergen Reactions  . Haldol [Haloperidol Lactate]     Over-sedation and at times agitation     Patient Measurements:   Heparin Dosing Weight: 105 kg  Vital Signs: Temp: 97.9 F (36.6 C) (01/06 0102) BP: 104/71 (01/06 0400) Pulse Rate: 92 (01/06 0400)  Labs: Recent Labs    09/04/18 0105  HGB 11.5*  HCT 38.8*  PLT 181  CREATININE 1.78*    CrCl cannot be calculated (Unknown ideal weight.).   Medical History: Past Medical History:  Diagnosis Date  . Anxiety   . Aortic valve insufficiency   . Arthritis    "wee bit; knees, elbows" (10/15/2014)  . Atrial fibrillation ([place])   . Cardiomyopathy ([place])   . Chronic kidney disease    "down to ~ 1/2 of their regular use; I see kidney dr. in [place]" (10/15/2014)  . Complication of anesthesia    unsure of complications but pt. states there were complications  . COPD (chronic obstructive pulmonary disease) ([place])    on home o2  . Coronary artery disease   . Decreased testosterone level   . Depression   . Difficult intubation    difficult intubation 12/03/09 and 03/27/10 ([facility]); glidescope used 03/27/10  . DVT (deep venous thrombosis) ([place]) ~ 2013   "BLE"  . Dysrhythmia    A. Fib  . Emphysema of lung ([place])   . Enlarged prostate   . Falls frequently    > 20 times in the last year/notes 10/15/2014  . GERD (gastroesophageal reflux disease)   . Heart murmur   . History of blood transfusion 1960   "lots; related to an accident"  . History of echocardiogram    post AVR >> Echo 7/16:  Mild LVH, EF 20-25%, AVR ok (peak 15 mmHg, mean 9 mmHg), MAC, mild MR, severe LAE, mild reduced RVSF, mild RAE, PASP 35 mmHg  . Hypercholesteremia   . Hypertension   . Hypothyroidism   . OSA (obstructive sleep apnea)    "suppose to wear mask; I throw it off in my sleep" (10/15/2014)  . Plantar fasciitis   . Right fibular  fracture 10/15/2014  . Seasonal allergies   . Shortness of breath dyspnea   . Syncope 10/15/2014  . Syncope and collapse "several times"  . Urine frequency   . Urine incontinence   . Varicose veins     Medications:  No current facility-administered medications on file prior to encounter.    Current Outpatient Medications on File Prior to Encounter  Medication Sig Dispense Refill  . apixaban (ELIQUIS) 2.5 MG TABS tablet Take 1 tablet (2.5 mg total) by mouth 2 (two) times daily. 180 tablet 1  . atorvastatin (LIPITOR) 10 MG tablet TAKE 1 TABLET BY MOUTH EVERY DAY (Patient taking differently: Take 10 mg by mouth daily. ) 90 tablet 1  . carvedilol (COREG) 25 MG tablet Take 25 mg by mouth 2 (two) times daily with a meal.    . dutasteride (AVODART) 0.5 MG capsule TAKE 1 CAPSULE BY MOUTH EVERY DAY (Patient taking differently: Take 0.5 mg by mouth daily. ) 90 capsule 1  . folic acid (FOLVITE) 1 MG tablet TAKE 1 TABLET BY MOUTH EVERY DAY (Patient taking differently: Take 1 mg by mouth daily. ) 90 tablet 1  . furosemide (LASIX) 20 MG tablet TAKE 1 TABLET BY MOUTH DAILY FOR WEIGHT GAIN 3 POUNDS IN 24 HOURS (Patient taking differently: Take  20 mg by mouth daily as needed for fluid (weight gain of 3 lbs in 24 hours). ) 30 tablet 3  . ipratropium-albuterol (DUONEB) 0.5-2.5 (3) MG/3ML SOLN USE 1 VIAL IN NEBULIZER EVERY 4 HOURS AS NEEDED (Patient taking differently: Inhale 3 mLs into the lungs every 4 (four) hours as needed (SOB). ) 270 mL 1  . KLOR-CON M10 10 MEQ tablet TAKE 1 TABLET BY MOUTH EVERY DAY (Patient taking differently: Take 10 mEq by mouth daily. ) 90 tablet 1  . levothyroxine (SYNTHROID, LEVOTHROID) 200 MCG tablet TAKE 1 TABLET BY MOUTH EVERY DAY (ALONG WITH 25MCG TABLET) (Patient taking differently: Take 200 mcg by mouth daily before breakfast. ) 90 tablet 1  . Memantine HCl-Donepezil HCl (NAMZARIC PO) Take 1 tablet by mouth daily.    . mirtazapine (REMERON) 7.5 MG tablet Take 15 mg by mouth at  bedtime.     . Tiotropium Bromide Monohydrate (SPIRIVA RESPIMAT) 2.5 MCG/ACT AERS 2 puffs daily (Patient taking differently: Inhale 2 puffs into the lungs daily. )    . memantine (NAMENDA XR) 28 MG CP24 24 hr capsule Take 1 capsule (28 mg total) by mouth daily. (Patient not taking: Reported on 09/04/2018) 90 capsule 0    Assessment: 85 y.o. male admitted with chest paun. H/ Afib and Eliquis on hold, for heparin.  Pt unsure when last dose of Eliquis taken.  Goal of Therapy:  APTT 66-102 Heparin level 0.3-0.7 units/ml Monitor platelets by anticoagulation protocol: Yes   Plan:  Start heparin 1000 units/hr APTT and heparin level in 8 hours  [name] [name], Bronson Curb 09/04/2018,5:57 AM

## 2018-09-04 NOTE — ED Provider Notes (Signed)
[facility] EMERGENCY DEPARTMENT Provider Note   CSN: [id number] Arrival date & time: 09/04/18  0050     History   Chief Complaint Chief Complaint  Patient presents with  . Chest Pain    HPI [name] [name] is a 85 y.o. male.  The history is provided by the patient.  He has history of hypertension, hyperlipidemia, COPD on chronic home oxygen, atrial fibrillation anticoagulated on apixaban, coronary artery disease, diastolic heart failure comes in with chest pain which started at rest.  He describes pain across the anterior chest which she rates at 8/10.  Pain is described as dull and tight.  There is associated dyspnea and mild diaphoresis but no nausea.  He has never had pain like this before.  There was no treatment at home.  Nothing made the pain better, nothing made it worse.  In the ambulance, he was given aspirin, and pain resolved completely.  He is currently pain-free.  He is a non-smoker and no history of diabetes.  Past Medical History:  Diagnosis Date  . Anxiety   . Aortic valve insufficiency   . Arthritis    "wee bit; knees, elbows" (10/15/2014)  . Atrial fibrillation ([place])   . Cardiomyopathy ([place])   . Chronic kidney disease    "down to ~ 1/2 of their regular use; I see kidney dr. in [place]" (10/15/2014)  . Complication of anesthesia    unsure of complications but pt. states there were complications  . COPD (chronic obstructive pulmonary disease) ([place])    on home o2  . Coronary artery disease   . Decreased testosterone level   . Depression   . Difficult intubation    difficult intubation 12/03/09 and 03/27/10 ([facility]); glidescope used 03/27/10  . DVT (deep venous thrombosis) ([place]) ~ 2013   "BLE"  . Dysrhythmia    A. Fib  . Emphysema of lung ([place])   . Enlarged prostate   . Falls frequently    > 20 times in the last year/notes 10/15/2014  . GERD (gastroesophageal reflux disease)   . Heart murmur   . History of blood transfusion 1960   "lots;  related to an accident"  . History of echocardiogram    post AVR >> Echo 7/16:  Mild LVH, EF 20-25%, AVR ok (peak 15 mmHg, mean 9 mmHg), MAC, mild MR, severe LAE, mild reduced RVSF, mild RAE, PASP 35 mmHg  . Hypercholesteremia   . Hypertension   . Hypothyroidism   . OSA (obstructive sleep apnea)    "suppose to wear mask; I throw it off in my sleep" (10/15/2014)  . Plantar fasciitis   . Right fibular fracture 10/15/2014  . Seasonal allergies   . Shortness of breath dyspnea   . Syncope 10/15/2014  . Syncope and collapse "several times"  . Urine frequency   . Urine incontinence   . Varicose veins     Patient Active Problem List   Diagnosis Date Noted  . Acute metabolic encephalopathy 32/95/1884  . Severe hypothyroidism 05/13/2018  . Acute respiratory failure with hypoxia and hypercapnia ([place]) 05/13/2018  . Acute on chronic diastolic (congestive) heart failure ([place]) 05/13/2018  . Dementia with behavioral disturbance ([place]) 05/13/2018  . History of DVT (deep vein thrombosis) 05/13/2018  . Bradycardia   . Permanent atrial fibrillation   . S/P AVR (aortic valve replacement)   . Respiratory failure ([place])   . Encounter for palliative care   . Goals of care, counseling/discussion   . Altered mental status 04/26/2018  .  Acute renal failure (ARF) ([place]) 04/26/2018  . Lower urinary tract infectious disease 04/26/2018  . Anemia 04/26/2018  . CKD (chronic kidney disease) stage 3, GFR 30-59 ml/min (HCC) 01/29/2018  . Acute kidney injury superimposed on chronic kidney disease ([place]) 01/27/2018  . Symptomatic hypotension 01/27/2018  . COPD with acute exacerbation ([place]) 01/27/2018  . Cellulitis 01/05/2018  . Pre-diabetes 01/07/2017  . COPD (chronic obstructive pulmonary disease) with chronic bronchitis ([place]) 12/08/2016  . Elevated hemidiaphragm 12/08/2016  . Enlarged thoracic aorta ([place]) 12/03/2016  . Chronic diastolic heart failure ([place]) 12/03/2016  . Pulmonary nodule 12/03/2016  .  Orthostatic hypotension 11/08/2015  . S/P AVR 02/11/2015  . Ataxia 12/05/2014  . Abnormality of gait 12/05/2014  . Neck pain, chronic 12/05/2014  . Abnormal carotid duplex scan 10/28/2014  . Obstructive sleep apnea 10/28/2014  . Mood disorder with psychosis ([place]) 10/28/2014  . Episodes of formed visual hallucinations 10/16/2014  . CAD (coronary artery disease), native coronary artery 09/29/2014  . Falls frequently 09/29/2014  . Essential hypertension   . Hyperlipidemia, unspecified   . AR (aortic regurgitation)   . Depression   . Paroxysmal A-fib ([place]) 06/20/2014  . Hypothyroidism 06/20/2014  . Exertional dyspnea 04/29/2014  . Obesity (BMI 30-39.9) 08/08/2012  . Anxiety 08/03/2012  . Hydrocele 07/14/2012  . Abdominal aortic aneurysm ([place]) 09/08/2011  . BPH (benign prostatic hyperplasia) 09/08/2011  . GERD (gastroesophageal reflux disease) 09/08/2011    Past Surgical History:  Procedure Laterality Date  . ABDOMINAL AORTIC ANEURYSM REPAIR  01/2006; 03/10/2006   Archie Endo 01/12/2011  . AORTIC VALVE REPLACEMENT N/A 02/11/2015   Procedure: AORTIC VALVE REPLACEMENT (AVR);  Surgeon: Grace Isaac, MD;  Location: [facility];  Service: Open Heart Surgery;  Laterality: N/A;  . BRAIN SURGERY  1960 X 4   "S/P got my skull busted"; pt. states removed internal carotid artery  . CARDIAC CATHETERIZATION  1990's X 1; 09/2014   no stents  . CLIPPING OF ATRIAL APPENDAGE N/A 02/11/2015   Procedure: CLIPPING OF ATRIAL APPENDAGE;  Surgeon: Grace Isaac, MD;  Location: [facility];  Service: Open Heart Surgery;  Laterality: N/A;  . ERCP  11/2009   Archie Endo 12/05/2009  . gall stone    . HERNIA REPAIR    . LAPAROSCOPIC CHOLECYSTECTOMY  08/2009   w/IOC/notes 09/18/2009  . LAPAROSCOPIC INCISIONAL / UMBILICAL / VENTRAL HERNIA REPAIR  02/2010   VHR w/mesh/notes 03/28/2010  . NOSE SURGERY    . TEE WITHOUT CARDIOVERSION N/A 02/11/2015   Procedure: TRANSESOPHAGEAL ECHOCARDIOGRAM (TEE);  Surgeon: Grace Isaac, MD;   Location: [facility];  Service: Open Heart Surgery;  Laterality: N/A;  . TONSILLECTOMY          Home Medications    Prior to Admission medications   Medication Sig Start Date End Date Taking? Authorizing Provider  apixaban (ELIQUIS) 2.5 MG TABS tablet Take 1 tablet (2.5 mg total) by mouth 2 (two) times daily. 02/21/18   Nahser, [name], MD  atorvastatin (LIPITOR) 10 MG tablet TAKE 1 TABLET BY MOUTH EVERY DAY 07/03/18   Lauree Chandler, NP  carvedilol (COREG) 25 MG tablet Take 25 mg by mouth 2 (two) times daily with a meal.    [provider]  dutasteride (AVODART) 0.5 MG capsule TAKE 1 CAPSULE BY MOUTH EVERY DAY 07/03/18   Lauree Chandler, NP  folic acid (FOLVITE) 1 MG tablet TAKE 1 TABLET BY MOUTH EVERY DAY 07/03/18   Lauree Chandler, NP  furosemide (LASIX) 20 MG tablet TAKE  1 TABLET BY MOUTH DAILY FOR WEIGHT GAIN 3 POUNDS IN 24 HOURS 06/29/18   Lauree Chandler, NP  ipratropium-albuterol (DUONEB) 0.5-2.5 (3) MG/3ML SOLN USE 1 VIAL IN NEBULIZER EVERY 4 HOURS AS NEEDED 06/29/18   Lauree Chandler, NP  KLOR-CON M10 10 MEQ tablet TAKE 1 TABLET BY MOUTH EVERY DAY 07/03/18   Lauree Chandler, NP  levothyroxine (SYNTHROID, LEVOTHROID) 200 MCG tablet TAKE 1 TABLET BY MOUTH EVERY DAY (ALONG WITH 25MCG TABLET) 07/03/18   Lauree Chandler, NP  memantine (NAMENDA XR) 28 MG CP24 24 hr capsule Take 1 capsule (28 mg total) by mouth daily. 08/08/18   Lauree Chandler, NP  mirtazapine (REMERON) 7.5 MG tablet Take 15 mg by mouth at bedtime.  10/13/15   [provider]  Tiotropium Bromide Monohydrate (SPIRIVA RESPIMAT) 2.5 MCG/ACT AERS 2 puffs daily 06/27/18   Lauree Chandler, NP    Family History Family History  Problem Relation Age of Onset  . Rheum arthritis Mother   . Diabetes Mother   . Heart disease Father   . Rheum arthritis Father   . Kidney disease Father   . Other Sister        Murdered  . Breast cancer Sister   . Colon cancer Neg Hx   . Colon polyps Neg Hx    . Liver disease Neg Hx     Social History Social History   Tobacco Use  . Smoking status: Former Smoker    Packs/day: 2.00    Years: 22.00    Pack years: 44.00    Types: Cigarettes    Last attempt to quit: 04/29/1974    Years since quitting: 44.3  . Smokeless tobacco: Never Used  Substance Use Topics  . Alcohol use: Not Currently    Alcohol/week: 0.0 standard drinks    Comment: 1 beer per year   . Drug use: No     Allergies   Haldol [haloperidol lactate]   Review of Systems Review of Systems  All other systems reviewed and are negative.    Physical Exam Updated Vital Signs BP 130/82   Pulse 96   Temp 97.9 F (36.6 C)   Resp 20   SpO2 100%   Physical Exam Vitals signs and nursing note reviewed.    85 year old male, resting comfortably and in no acute distress. Vital signs are normal. Oxygen saturation is 100%, which is normal. Head is normocephalic and atraumatic. PERRLA, EOMI. Oropharynx is clear. Neck is nontender and supple without adenopathy or JVD. Back is nontender and there is no CVA tenderness. Lungs are clear without rales, wheezes, or rhonchi. Chest is nontender. Heart has regular rate and rhythm without murmur. Abdomen is soft, flat, nontender without masses or hepatosplenomegaly and peristalsis is normoactive. Extremities have no cyanosis or edema, full range of motion is present. Skin is warm and dry without rash. Neurologic: Mental status is normal, cranial nerves are intact, there are no motor or sensory deficits.  ED Treatments / Results  Labs (all labs ordered are listed, but only abnormal results are displayed) Labs Reviewed  BASIC METABOLIC PANEL - Abnormal; Notable for the following components:      Result Value   Glucose, Bld 122 (*)    BUN 27 (*)    Creatinine, Ser 1.78 (*)    GFR calc non Af Amer 34 (*)    GFR calc Af Amer 40 (*)    All other components within normal limits  CBC - Abnormal;  Notable for the following  components:   RBC 4.09 (*)    Hemoglobin 11.5 (*)    HCT 38.8 (*)    MCHC 29.6 (*)    All other components within normal limits  I-STAT TROPONIN, ED  I-STAT TROPONIN, ED    EKG EKG Interpretation  Date/Time:  Monday September 04 2018 00:58:15 EST Ventricular Rate:  95 PR Interval:    QRS Duration: 150 QT Interval:  415 QTC Calculation: 522 R Axis:   1 Text Interpretation:  Atrial fibrillation Ventricular premature complex Left bundle branch block When compared with ECG of 05/03/2018, No significant change was found Confirmed by Delora Fuel ([id number]) on 09/04/2018 1:08:35 AM   Radiology Dg Chest 2 View  Result Date: 09/04/2018 CLINICAL DATA:  Shortness of breath. EXAM: CHEST - 2 VIEW COMPARISON:  05/04/2018 FINDINGS: Post median sternotomy. Stable cardiomegaly and prosthetic aortic valve. Prior left atrial clipping. Similar vascular congestion to prior exam. Right basilar scarring. No large pleural effusion or pneumothorax. IMPRESSION: 1. Unchanged cardiomegaly and vascular congestion compared to exam 4 months ago. 2. Right basilar scarring. Electronically Signed   By: Keith Rake M.D.   On: 09/04/2018 01:33    Procedures Procedures  Medications Ordered in ED Medications  nitroGLYCERIN (NITROSTAT) SL tablet 0.4 mg (0.4 mg Sublingual Given 09/04/18 0342)     Initial Impression / Assessment and Plan / ED Course  I have reviewed the triage vital signs and the nursing notes.  Pertinent labs & imaging results that were available during my care of the patient were reviewed by me and considered in my medical decision making (see chart for details).  Chest pain, possibly of cardiac origin.  Old records are reviewed, and in 2016 he did have a cardiac catheterization which showed a 70% lesion in the distal right coronary artery, no other significant coronary disease at that time.  A few months later, he had aortic valve replacement, but no bypass done at that time.  He was recently  evaluated by palliative care service, and is DNR.  Cardiology note states coronary artery disease involving RCA not bypassed because of lack of acceptable bypass vessel.  Heart score is 5, which puts him at moderate risk for major adverse cardiac events in the next 6 weeks.  He had recurrence of his chest pain while in the ED, pain relieved completely both to nitroglycerin.  Chest x-ray is unchanged from baseline.  Labs show mild renal insufficiency and anemia which are unchanged from baseline.  Troponin is normal.  Because of second episode of pain at rest, it is felt that he should be admitted for further evaluation, consideration for repeat cardiac catheterization and stent placement if appropriate.  Case is discussed with Dr. Hal Hope of [facility] hospitalist, who agrees to admit the patient.  Final Clinical Impressions(s) / ED Diagnoses   Final diagnoses:  Angina pectoris [facility])  Renal insufficiency  Normocytic anemia    ED Discharge Orders    None       Delora Fuel, MD 04/88/89 [phone]

## 2018-09-04 NOTE — Progress Notes (Signed)
ANTICOAGULATION CONSULT NOTE - follow up  Pharmacy Consult : restart Eliquis Indication: atrial fibrillation  Allergies  Allergen Reactions  . Haldol [Haloperidol Lactate]     Over-sedation and at times agitation     Patient Measurements: Height: 6' (182.9 cm) Weight: 290 lb (131.5 kg) IBW/kg (Calculated) : 77.6 Heparin Dosing Weight: 105 kg  Vital Signs: Temp: 97.4 F (36.3 C) (01/06 1800) Temp Source: Oral (01/06 1800) BP: 131/90 (01/06 1800) Pulse Rate: 82 (01/06 1800)  Labs: Recent Labs    09/04/18 0105 09/04/18 0353 09/04/18 0602 09/04/18 1313 09/04/18 1523 09/04/18 1752  HGB 11.5*  --   --   --   --   --   HCT 38.8*  --   --   --   --   --   PLT 181  --   --   --   --   --   APTT  --   --  30  --  55*  --   LABPROT  --   --  15.5*  --   --   --   INR  --   --  1.24  --   --   --   HEPARINUNFRC  --   --  0.30  --  0.40  --   CREATININE 1.78*  --   --   --   --   --   TROPONINI  --  <0.03  --  <0.03  --  <0.03    Estimated Creatinine Clearance: 43.3 mL/min (A) (by C-G formula based on SCr of 1.78 mg/dL (H)).   Medical History: Past Medical History:  Diagnosis Date  . Anxiety   . Aortic valve insufficiency   . Arthritis    "wee bit; knees, elbows" (10/15/2014)  . Atrial fibrillation ([place])   . Cardiomyopathy ([place])   . Chronic kidney disease    "down to ~ 1/2 of their regular use; I see kidney dr. in [place]" (10/15/2014)  . Complication of anesthesia    unsure of complications but pt. states there were complications  . COPD (chronic obstructive pulmonary disease) ([place])    on home o2  . Coronary artery disease   . Decreased testosterone level   . Depression   . Difficult intubation    difficult intubation 12/03/09 and 03/27/10 ([facility]); glidescope used 03/27/10  . DVT (deep venous thrombosis) ([place]) ~ 2013   "BLE"  . Dysrhythmia    A. Fib  . Emphysema of lung ([place])   . Enlarged prostate   . Falls frequently    > 20 times in the last year/notes  10/15/2014  . GERD (gastroesophageal reflux disease)   . Heart murmur   . History of blood transfusion 1960   "lots; related to an accident"  . History of echocardiogram    post AVR >> Echo 7/16:  Mild LVH, EF 20-25%, AVR ok (peak 15 mmHg, mean 9 mmHg), MAC, mild MR, severe LAE, mild reduced RVSF, mild RAE, PASP 35 mmHg  . Hypercholesteremia   . Hypertension   . Hypothyroidism   . OSA (obstructive sleep apnea)    "suppose to wear mask; I throw it off in my sleep" (10/15/2014)  . Plantar fasciitis   . Right fibular fracture 10/15/2014  . Seasonal allergies   . Shortness of breath dyspnea   . Syncope 10/15/2014  . Syncope and collapse "several times"  . Urine frequency   . Urine incontinence   . Varicose veins  Medications:  No current facility-administered medications on file prior to encounter.    Current Outpatient Medications on File Prior to Encounter  Medication Sig Dispense Refill  . apixaban (ELIQUIS) 2.5 MG TABS tablet Take 1 tablet (2.5 mg total) by mouth 2 (two) times daily. 180 tablet 1  . atorvastatin (LIPITOR) 10 MG tablet TAKE 1 TABLET BY MOUTH EVERY DAY (Patient taking differently: Take 10 mg by mouth daily. ) 90 tablet 1  . carvedilol (COREG) 25 MG tablet Take 25 mg by mouth 2 (two) times daily with a meal.    . dutasteride (AVODART) 0.5 MG capsule TAKE 1 CAPSULE BY MOUTH EVERY DAY (Patient taking differently: Take 0.5 mg by mouth daily. ) 90 capsule 1  . folic acid (FOLVITE) 1 MG tablet TAKE 1 TABLET BY MOUTH EVERY DAY (Patient taking differently: Take 1 mg by mouth daily. ) 90 tablet 1  . furosemide (LASIX) 20 MG tablet TAKE 1 TABLET BY MOUTH DAILY FOR WEIGHT GAIN 3 POUNDS IN 24 HOURS (Patient taking differently: Take 20 mg by mouth daily as needed for fluid (weight gain of 3 lbs in 24 hours). ) 30 tablet 3  . ipratropium-albuterol (DUONEB) 0.5-2.5 (3) MG/3ML SOLN USE 1 VIAL IN NEBULIZER EVERY 4 HOURS AS NEEDED (Patient taking differently: Inhale 3 mLs into the lungs  every 4 (four) hours as needed (SOB). ) 270 mL 1  . KLOR-CON M10 10 MEQ tablet TAKE 1 TABLET BY MOUTH EVERY DAY (Patient taking differently: Take 10 mEq by mouth daily. ) 90 tablet 1  . levothyroxine (SYNTHROID, LEVOTHROID) 200 MCG tablet TAKE 1 TABLET BY MOUTH EVERY DAY (ALONG WITH 25MCG TABLET) (Patient taking differently: Take 200 mcg by mouth daily before breakfast. ) 90 tablet 1  . Memantine HCl-Donepezil HCl (NAMZARIC PO) Take 1 tablet by mouth daily.    . mirtazapine (REMERON) 7.5 MG tablet Take 15 mg by mouth at bedtime.     . Tiotropium Bromide Monohydrate (SPIRIVA RESPIMAT) 2.5 MCG/ACT AERS 2 puffs daily (Patient taking differently: Inhale 2 puffs into the lungs daily. )    . memantine (NAMENDA XR) 28 MG CP24 24 hr capsule Take 1 capsule (28 mg total) by mouth daily. (Patient not taking: Reported on 09/04/2018) 90 capsule 0    Assessment: 85 y.o. male admitted with chest pain. H/ Afib, on  Eliquis 2.5 mg BID. Heparin drip now discontinued and pharmacy consulted to restart his Eliquis for nonvalvular afib.  85 y.o , Scr 1.78,  Wt is 131.5 kg  Goal of Therapy:  Monitor platelets by anticoagulation protocol: Yes   Plan:  Heparin discontinued.  Restart Eliquis 2.5 mg BID  Nicole Cella, RPh Clinical Pharmacist Pager: [phone] Please check AMION for all [facility] [place] phone numbers After 10:00 PM, call [place] [phone] 09/04/2018,7:40 PM

## 2018-09-05 ENCOUNTER — Telehealth: Payer: Self-pay | Admitting: *Deleted

## 2018-09-05 ENCOUNTER — Other Ambulatory Visit: Payer: Self-pay

## 2018-09-05 DIAGNOSIS — J449 Chronic obstructive pulmonary disease, unspecified: Secondary | ICD-10-CM | POA: Diagnosis not present

## 2018-09-05 DIAGNOSIS — G4733 Obstructive sleep apnea (adult) (pediatric): Secondary | ICD-10-CM

## 2018-09-05 DIAGNOSIS — I259 Chronic ischemic heart disease, unspecified: Secondary | ICD-10-CM | POA: Diagnosis not present

## 2018-09-05 DIAGNOSIS — E785 Hyperlipidemia, unspecified: Secondary | ICD-10-CM

## 2018-09-05 DIAGNOSIS — Z952 Presence of prosthetic heart valve: Secondary | ICD-10-CM

## 2018-09-05 DIAGNOSIS — N183 Chronic kidney disease, stage 3 (moderate): Secondary | ICD-10-CM | POA: Diagnosis not present

## 2018-09-05 NOTE — Telephone Encounter (Signed)
TOC call. Tried calling patient. Cannot leave VM, memory full. Will try again later.

## 2018-09-05 NOTE — Discharge Summary (Signed)
Physician Discharge Summary  [name] [name] YME:[id number] DOB: [birth year] DOA: 09/04/2018  PCP: Sharon Seller, NP  Admit date: 09/04/2018 Discharge date: 09/05/2018  Admitted From: Home Disposition: Home   Recommendations for Outpatient Follow-up:  1. Follow up with PCP in 1-2 weeks 2. Please obtain BMP/CBC in one week  Home Health: None Equipment/Devices: None new, continue supplemental oxygen Discharge Condition: Stable CODE STATUS: DNR Diet recommendation: Heart healthy  Brief/Interim Summary: [name] [name] is an 85 y.o. male with a history of permanent AFib, chronic dCHF, AR s/p bioprosthetic AVR, CAD with distal RCA stenosis, chronic LBBB, OSA, AAA s/p repair, HTN, HLD, and dementia who presented with chest pain, admitted 09/04/2018 for ACS evaluation. Cardiac enzymes were negative, chest pain subsided, ECG was not overtly ischemic. Cardiology was consulted and felt cardiac ischemia was not the cause of chest pain, and the patient is at very high risk for further intervention, so continued outpatient medical optimization is recommended. Medications are continued.  Discharge Diagnoses:  Principal Problem:   Chest pain Active Problems:   Hyperlipidemia, unspecified   Obstructive sleep apnea   COPD (chronic obstructive pulmonary disease) with chronic bronchitis (HCC)   CKD (chronic kidney disease) stage 3, GFR 30-59 ml/min (HCC)   Permanent atrial fibrillation   S/P AVR (aortic valve replacement)  1. Chest pain with history of CAD with occluded RCA: Admitted for ACS evaluation. Enzymes have remained undetectable with ECG showing chronic LBBB. With CKD, poor functional status, negative enzymes, and atypical features to history, cardiology consultant is not inclined to pursue catheterization at this time. Continue home medications including anticoagulation, statin, beta blocker. 2. Chronic diastolic CHF: Echo in June 2019 showed normal EF.  Appears compensated. 3. Permanent atrial  fibrillation: Rate is controlled. Continue coreg and eliquis. Was transiently switched to heparin gtt in the event that cardiac procedure was indicated, though was able to switch back once cardiology recommended no further intervention/work up.  4. Stage III CKD: CrCl 51ml/min. Avoid nephrotoxins. Increases risk of catheterization. 5. Hyperlipidemia: Continue statin 6. HTN: Continue home medications 7. Hypothyroidism: Continue synthroid 8. BPH: Continue dutasteride 9. Chronic hypoxic respiratory failure due to COPD: Continue home medications as this appears stable. 10. OSA: Not on CPAP for many years. Recommended restarting this as able. 11. Dementia: Mental status at baseline. Discussed with family daily while hospitalized. 12. Obesity: BMI 39. Noted, weight loss recommended.   Discharge Instructions Discharge Instructions    Diet - low sodium heart healthy   Complete by:  As directed    Discharge instructions   Complete by:  As directed    You were evaluated for a cardiac cause to the chest pain you were experiencing. Fortunately, there has been no evidence of ischemia/damage to the heart, so your coronary artery disease is not thought to be the cause of the pain. You should continue your current medications and follow up with your primary doctor. Seek medical attention if your symptoms return/worsen.   Increase activity slowly   Complete by:  As directed      Allergies as of 09/05/2018      Reactions   Haldol [haloperidol Lactate]    Over-sedation and at times agitation       Medication List    TAKE these medications   apixaban 2.5 MG Tabs tablet Commonly known as:  ELIQUIS Take 1 tablet (2.5 mg total) by mouth 2 (two) times daily.   atorvastatin 10 MG tablet Commonly known as:  LIPITOR TAKE 1 TABLET BY MOUTH EVERY  DAY   carvedilol 25 MG tablet Commonly known as:  COREG Take 25 mg by mouth 2 (two) times daily with a meal.   dutasteride 0.5 MG capsule Commonly known as:   AVODART TAKE 1 CAPSULE BY MOUTH EVERY DAY What changed:  how much to take   folic acid 1 MG tablet Commonly known as:  FOLVITE TAKE 1 TABLET BY MOUTH EVERY DAY   furosemide 20 MG tablet Commonly known as:  LASIX TAKE 1 TABLET BY MOUTH DAILY FOR WEIGHT GAIN 3 POUNDS IN 24 HOURS What changed:  See the new instructions.   ipratropium-albuterol 0.5-2.5 (3) MG/3ML Soln Commonly known as:  DUONEB USE 1 VIAL IN NEBULIZER EVERY 4 HOURS AS NEEDED What changed:  See the new instructions.   KLOR-CON M10 10 MEQ tablet Generic drug:  potassium chloride TAKE 1 TABLET BY MOUTH EVERY DAY What changed:  how much to take   levothyroxine 200 MCG tablet Commonly known as:  SYNTHROID, LEVOTHROID TAKE 1 TABLET BY MOUTH EVERY DAY (ALONG WITH TABLET) What changed:  See the new instructions.   memantine 28 MG Cp24 24 hr capsule Commonly known as:  NAMENDA XR Take 1 capsule (28 mg total) by mouth daily.   mirtazapine 7.5 MG tablet Commonly known as:  REMERON Take 15 mg by mouth at bedtime.   NAMZARIC PO Take 1 tablet by mouth daily.   Tiotropium Bromide Monohydrate 2.5 MCG/ACT Aers Commonly known as:  SPIRIVA RESPIMAT 2 puffs daily What changed:    how much to take  how to take this  when to take this  additional instructions      Follow-up Information    Sharon Seller, NP Follow up.   Specialty:  Geriatric Medicine Contact information: [address] [place] [address] [phone]        Nahser, Deloris Ping, MD .   Specialty:  Cardiology Contact information: [address] [place] [address] [phone]          Allergies  Allergen Reactions  . Haldol [Haloperidol Lactate]     Over-sedation and at times agitation     Consultations:  Cardiology, Dr. Excell Seltzer  Procedures/Studies: Dg Chest 2 View  Result Date: 09/04/2018 CLINICAL DATA:  Shortness of breath. EXAM: CHEST - 2 VIEW COMPARISON:  05/04/2018 FINDINGS: Post median  sternotomy. Stable cardiomegaly and prosthetic aortic valve. Prior left atrial clipping. Similar vascular congestion to prior exam. Right basilar scarring. No large pleural effusion or pneumothorax. IMPRESSION: 1. Unchanged cardiomegaly and vascular congestion compared to exam 4 months ago. 2. Right basilar scarring. Electronically Signed   By: Narda Rutherford M.D.   On: 09/04/2018 01:33    Subjective: No further chest pain. Dyspnea is mild-moderate, worse with exertion which has caused him to be sedentary for many months. This is unchanged throughout hospitalization and at baseline. No palpitations, abd pain, N/V/D. Right leg redness is stable, nontender.   Discharge Exam: Vitals:   09/04/18 2139 09/05/18 0541  BP: 132/90 137/83  Pulse: 84 77  Resp: 16   Temp: 98.2 F (36.8 C) 97.8 F (36.6 C)  SpO2: 99% 100%   General: Pt is alert, awake, not in acute distress Cardiovascular: RRR, S1/S2 +, soft I/VI systolic flow murmur at base, no rubs, no gallops Respiratory: CTA bilaterally, no wheezing, no rhonchi Abdominal: Soft, NT, ND, bowel sounds + Extremities: Trace edema, RLE with nontender erythema spolotchy, irregular without induration or fluctuance or open wounds.   Labs: BNP (last  3 results) Recent Labs    01/28/18 0111  BNP 130.2*   Basic Metabolic Panel: Recent Labs  Lab 09/04/18 0105  NA 141  K 4.2  CL 105  CO2 27  GLUCOSE 122*  BUN 27*  CREATININE 1.78*  CALCIUM 9.0   Liver Function Tests: Recent Labs  Lab 09/04/18 0353  AST 149*  ALT 58*  ALKPHOS 160*  BILITOT 1.2  PROT 6.3*  ALBUMIN 2.9*   Recent Labs  Lab 09/04/18 0353  LIPASE 32   No results for input(s): AMMONIA in the last 168 hours. CBC: Recent Labs  Lab 09/04/18 0105  WBC 6.8  HGB 11.5*  HCT 38.8*  MCV 94.9  PLT 181   Cardiac Enzymes: Recent Labs  Lab 09/04/18 0353 09/04/18 1313 09/04/18 1752  TROPONINI <0.03 <0.03 <0.03   BNP: Invalid input(s): POCBNP CBG: No results for  input(s): GLUCAP in the last 168 hours. D-Dimer No results for input(s): DDIMER in the last 72 hours. Hgb A1c No results for input(s): HGBA1C in the last 72 hours. Lipid Profile No results for input(s): CHOL, HDL, LDLCALC, TRIG, CHOLHDL, LDLDIRECT in the last 72 hours. Thyroid function studies No results for input(s): TSH, T4TOTAL, T3FREE, THYROIDAB in the last 72 hours.  Invalid input(s): FREET3 Anemia work up No results for input(s): VITAMINB12, FOLATE, FERRITIN, TIBC, IRON, RETICCTPCT in the last 72 hours. Urinalysis    Component Value Date/Time   COLORURINE AMBER (A) 04/26/2018 0442   APPEARANCEUR HAZY (A) 04/26/2018 0442   LABSPEC 1.033 (H) 04/26/2018 0442   PHURINE 5.0 04/26/2018 0442   GLUCOSEU NEGATIVE 04/26/2018 0442   HGBUR NEGATIVE 04/26/2018 0442   BILIRUBINUR MODERATE (A) 04/26/2018 0442   KETONESUR 5 (A) 04/26/2018 0442   PROTEINUR 30 (A) 04/26/2018 0442   UROBILINOGEN 2.0 (H) 02/21/2015 1241   NITRITE NEGATIVE 04/26/2018 0442   LEUKOCYTESUR TRACE (A) 04/26/2018 0442    Microbiology No results found for this or any previous visit (from the past 240 hour(s)).  Time coordinating discharge: Approximately 40 minutes  Tyrone Nineyan B Aizley Stenseth, MD  [facility] 09/05/2018, 9:30 AM Pager [phone]

## 2018-09-06 NOTE — Telephone Encounter (Signed)
TOC Call. Tried calling patient. Cannot leave VM, memory full.   2 unsuccessful attempts.

## 2018-09-13 ENCOUNTER — Ambulatory Visit: Payer: Self-pay | Admitting: Pulmonary Disease

## 2018-09-13 NOTE — Progress Notes (Deleted)
Subjective:    Patient ID: [name] [name], male    DOB: [birth year], 85 y.o.   MRN: [id number]  Synopsis: Evaluated in 2018 for shortness of breath, found to have COPD based on historical lung function testing from [facility]. He also has a history of aortic valve disease status post aortic valve replacement in 2016, atrial fibrillation, DVT.  HPI No chief complaint on file.  ***  Past Medical History:  Diagnosis Date  . Anxiety   . Aortic valve insufficiency   . Arthritis    "wee bit; knees, elbows" (10/15/2014)  . Atrial fibrillation ([place])   . Cardiomyopathy ([place])   . Chronic kidney disease    "down to ~ 1/2 of their regular use; I see kidney dr. in [place]" (10/15/2014)  . Complication of anesthesia    unsure of complications but pt. states there were complications  . COPD (chronic obstructive pulmonary disease) ([place])    on home o2  . Coronary artery disease   . Decreased testosterone level   . Depression   . Difficult intubation    difficult intubation 12/03/09 and 03/27/10 ([facility]); glidescope used 03/27/10  . DVT (deep venous thrombosis) ([place]) ~ 2013   "BLE"  . Dysrhythmia    A. Fib  . Emphysema of lung ([place])   . Enlarged prostate   . Falls frequently    > 20 times in the last year/notes 10/15/2014  . GERD (gastroesophageal reflux disease)   . Heart murmur   . History of blood transfusion 1960   "lots; related to an accident"  . History of echocardiogram    post AVR >> Echo 7/16:  Mild LVH, EF 20-25%, AVR ok (peak 15 mmHg, mean 9 mmHg), MAC, mild MR, severe LAE, mild reduced RVSF, mild RAE, PASP 35 mmHg  . Hypercholesteremia   . Hypertension   . Hypothyroidism   . OSA (obstructive sleep apnea)    "suppose to wear mask; I throw it off in my sleep" (10/15/2014)  . Plantar fasciitis   . Right fibular fracture 10/15/2014  . Seasonal allergies   . Shortness of breath dyspnea   . Syncope 10/15/2014  . Syncope and collapse "several times"  . Urine frequency   . Urine  incontinence   . Varicose veins       Review of Systems  Constitutional: Negative for chills, fatigue and fever.  HENT: Negative for postnasal drip, sinus pressure and sinus pain.   Respiratory: Positive for cough, shortness of breath and wheezing.   Cardiovascular: Positive for chest pain. Negative for palpitations and leg swelling.       Objective:   Physical Exam  There were no vitals filed for this visit. RA  Ambulated 150 feet on RA and O2 saturation dropped no lower than 93%, he refused to walk further  ***  PFT: [facility] 2016 ratio of 62, FEV1 2.17 L, total lung capacity 7.47 L (77% predicted), DLCO 23.8 (79% predicted)  2017 CT chest images independently reviewed showing a pulmonary nodule the right upper lobe with some surrounding groundglass, there is some subsegmental atelectasis and rounded atelectasis in the right lung is well worse in the base, there is a significant elevation of the right hemidiaphragm.  Records from cardiology reviewed were he is cared for for his systolic heart failure.     Assessment & Plan:   No diagnosis found.  Discussion: ***nodule    Current Outpatient Medications:  .  apixaban (ELIQUIS) 2.5 MG TABS tablet, Take 1 tablet (2.5 mg  total) by mouth 2 (two) times daily., Disp: 180 tablet, Rfl: 1 .  atorvastatin (LIPITOR) 10 MG tablet, TAKE 1 TABLET BY MOUTH EVERY DAY (Patient taking differently: Take 10 mg by mouth daily. ), Disp: 90 tablet, Rfl: 1 .  carvedilol (COREG) 25 MG tablet, Take 25 mg by mouth 2 (two) times daily with a meal., Disp: , Rfl:  .  dutasteride (AVODART) 0.5 MG capsule, TAKE 1 CAPSULE BY MOUTH EVERY DAY (Patient taking differently: Take 0.5 mg by mouth daily. ), Disp: 90 capsule, Rfl: 1 .  folic acid (FOLVITE) 1 MG tablet, TAKE 1 TABLET BY MOUTH EVERY DAY (Patient taking differently: Take 1 mg by mouth daily. ), Disp: 90 tablet, Rfl: 1 .  furosemide (LASIX) 20 MG tablet, TAKE 1 TABLET BY MOUTH DAILY FOR WEIGHT GAIN 3  POUNDS IN 24 HOURS (Patient taking differently: Take 20 mg by mouth daily as needed for fluid (weight gain of 3 lbs in 24 hours). ), Disp: 30 tablet, Rfl: 3 .  ipratropium-albuterol (DUONEB) 0.5-2.5 (3) MG/3ML SOLN, USE 1 VIAL IN NEBULIZER EVERY 4 HOURS AS NEEDED (Patient taking differently: Inhale 3 mLs into the lungs every 4 (four) hours as needed (SOB). ), Disp: 270 mL, Rfl: 1 .  KLOR-CON M10 10 MEQ tablet, TAKE 1 TABLET BY MOUTH EVERY DAY (Patient taking differently: Take 10 mEq by mouth daily. ), Disp: 90 tablet, Rfl: 1 .  levothyroxine (SYNTHROID, LEVOTHROID) 200 MCG tablet, TAKE 1 TABLET BY MOUTH EVERY DAY (ALONG WITH 25MCG TABLET) (Patient taking differently: Take 200 mcg by mouth daily before breakfast. ), Disp: 90 tablet, Rfl: 1 .  memantine (NAMENDA XR) 28 MG CP24 24 hr capsule, Take 1 capsule (28 mg total) by mouth daily. (Patient not taking: Reported on 09/04/2018), Disp: 90 capsule, Rfl: 0 .  Memantine HCl-Donepezil HCl (NAMZARIC PO), Take 1 tablet by mouth daily., Disp: , Rfl:  .  mirtazapine (REMERON) 7.5 MG tablet, Take 15 mg by mouth at bedtime. , Disp: , Rfl:  .  Tiotropium Bromide Monohydrate (SPIRIVA RESPIMAT) 2.5 MCG/ACT AERS, 2 puffs daily (Patient taking differently: Inhale 2 puffs into the lungs daily. ), Disp: , Rfl:

## 2018-09-14 ENCOUNTER — Ambulatory Visit: Payer: Medicare Other | Admitting: Nurse Practitioner

## 2018-09-29 ENCOUNTER — Other Ambulatory Visit: Payer: Self-pay | Admitting: *Deleted

## 2018-09-29 ENCOUNTER — Encounter: Payer: Self-pay | Admitting: Family

## 2018-09-29 ENCOUNTER — Ambulatory Visit (INDEPENDENT_AMBULATORY_CARE_PROVIDER_SITE_OTHER): Payer: Medicare Other | Admitting: Family

## 2018-09-29 ENCOUNTER — Telehealth: Payer: Self-pay

## 2018-09-29 VITALS — BP 110/80 | HR 64 | Temp 98.2°F | Ht 72.0 in | Wt 259.8 lb

## 2018-09-29 DIAGNOSIS — B372 Candidiasis of skin and nail: Secondary | ICD-10-CM | POA: Diagnosis not present

## 2018-09-29 DIAGNOSIS — R399 Unspecified symptoms and signs involving the genitourinary system: Secondary | ICD-10-CM

## 2018-09-29 DIAGNOSIS — E034 Atrophy of thyroid (acquired): Secondary | ICD-10-CM

## 2018-09-29 DIAGNOSIS — R410 Disorientation, unspecified: Secondary | ICD-10-CM

## 2018-09-29 LAB — CBC WITH DIFFERENTIAL/PLATELET
Absolute Monocytes: 321 cells/uL (ref 200–950)
Basophils Absolute: 9 cells/uL (ref 0–200)
Basophils Relative: 0.2 %
Eosinophils Absolute: 79 cells/uL (ref 15–500)
Eosinophils Relative: 1.8 %
HCT: 35.8 % — ABNORMAL LOW (ref 38.5–50.0)
HEMOGLOBIN: 11.7 g/dL — AB (ref 13.2–17.1)
Lymphs Abs: 1223 cells/uL (ref 850–3900)
MCH: 28.8 pg (ref 27.0–33.0)
MCHC: 32.7 g/dL (ref 32.0–36.0)
MCV: 88.2 fL (ref 80.0–100.0)
MPV: 11.2 fL (ref 7.5–12.5)
Monocytes Relative: 7.3 %
NEUTROS ABS: 2768 {cells}/uL (ref 1500–7800)
Neutrophils Relative %: 62.9 %
Platelets: 175 10*3/uL (ref 140–400)
RBC: 4.06 10*6/uL — ABNORMAL LOW (ref 4.20–5.80)
RDW: 13.2 % (ref 11.0–15.0)
Total Lymphocyte: 27.8 %
WBC: 4.4 10*3/uL (ref 3.8–10.8)

## 2018-09-29 LAB — COMPLETE METABOLIC PANEL WITH GFR
AG Ratio: 1.2 (calc) (ref 1.0–2.5)
ALBUMIN MSPROF: 3.6 g/dL (ref 3.6–5.1)
ALT: 222 U/L — ABNORMAL HIGH (ref 9–46)
AST: 247 U/L — ABNORMAL HIGH (ref 10–35)
Alkaline phosphatase (APISO): 1177 U/L — ABNORMAL HIGH (ref 40–115)
BUN/Creatinine Ratio: 10 (calc) (ref 6–22)
BUN: 18 mg/dL (ref 7–25)
CO2: 29 mmol/L (ref 20–32)
Calcium: 9.2 mg/dL (ref 8.6–10.3)
Chloride: 104 mmol/L (ref 98–110)
Creat: 1.75 mg/dL — ABNORMAL HIGH (ref 0.70–1.11)
GFR, Est African American: 41 mL/min/{1.73_m2} — ABNORMAL LOW (ref >=60)
GFR, Est Non African American: 35 mL/min/{1.73_m2} — ABNORMAL LOW (ref >=60)
Globulin: 3.1 g/dL (calc) (ref 1.9–3.7)
Glucose, Bld: 102 mg/dL — ABNORMAL HIGH (ref 65–99)
Potassium: 4.7 mmol/L (ref 3.5–5.3)
Sodium: 141 mmol/L (ref 135–146)
TOTAL PROTEIN: 6.7 g/dL (ref 6.1–8.1)
Total Bilirubin: 1.2 mg/dL (ref 0.2–1.2)

## 2018-09-29 LAB — URINALYSIS, ROUTINE W REFLEX MICROSCOPIC
Bacteria, UA: NONE SEEN /HPF
Bilirubin Urine: NEGATIVE
Glucose, UA: NEGATIVE
HGB URINE DIPSTICK: NEGATIVE
Nitrite: NEGATIVE
Protein, ur: NEGATIVE
Specific Gravity, Urine: 1.02 (ref 1.001–1.03)
pH: <=5 (ref 5.0–8.0)

## 2018-09-29 LAB — TSH: TSH: 0.1 mIU/L — ABNORMAL LOW (ref 0.40–4.50)

## 2018-09-29 MED ORDER — NYSTATIN 100000 UNIT/GM EX CREA: 1.0000 "application " | TOPICAL_CREAM | Freq: Two times a day (BID) | CUTANEOUS | 0 refills | Status: AC

## 2018-09-29 NOTE — Telephone Encounter (Signed)
Patient son (same name as patient) called stating patient took an at home UTI test and it resulted positive for leucocytes and inconclusive nitrites.   I advised that patient will need an appointment. Appointment scheduled for today at 10:00 am

## 2018-09-29 NOTE — Progress Notes (Addendum)
Provider: Trisa Cranor FNP-C  Lauree Chandler, NP  Patient Care Team: Lauree Chandler, NP as PCP - General (Geriatric Medicine) Nahser, [name], MD as PCP - Cardiology (Cardiology) Nahser, [name], MD as Consulting Physician (Cardiology) [name], MD as Referring Physician (Urology)  Extended Emergency Contact Information Primary Emergency Contact: Greiner,[name] Address: [address]          [place], [place] [address] [name] Phone: [phone] Mobile Phone: [phone] Relation: Son Secondary Emergency Contact: Detert,[name] Address: [place]          [place] Phone: [phone] Relation: Spouse   Goals of care: Advanced Directive information Advanced Directives 09/04/2018  Does Patient Have a Medical Advance Directive? No  Type of Advance Directive -  Does patient want to make changes to medical advance directive? -  Copy of [place] in Chart? -  Would patient like information on creating a medical advance directive? Yes (Inpatient - patient requests chaplain consult to create a medical advance directive)  Pre-existing out of facility DNR order (yellow form or pink MOST form) -     Chief Complaint  Patient presents with  . Acute Visit    Possible UTI patients states has been going on for 2 to 3 days of confusion     HPI:  Pt is a 85 y.o. male seen today for an acute visit for evaluation of increased confusion for the past 3 days.He is here escorted by the son who provides most of the HPI.Patient sleeping during visit though easily awakens and answers questions.son reports patient usually acts this way whenever he has a UTI.Son did a home dip stick on patient's urine and it showed possible UTI.He denies any fever or chills.   Past Medical History:  Diagnosis Date  . Anxiety   . Aortic valve insufficiency   . Arthritis    "wee bit; knees, elbows" (10/15/2014)  .  Atrial fibrillation ([place])   . Cardiomyopathy ([place])   . Chronic kidney disease    "down to ~ 1/2 of their regular use; I see kidney dr. in [place]" (10/15/2014)  . Complication of anesthesia    unsure of complications but pt. states there were complications  . COPD (chronic obstructive pulmonary disease) ([place])    on home o2  . Coronary artery disease   . Decreased testosterone level   . Depression   . Difficult intubation    difficult intubation 12/03/09 and 03/27/10 ([facility]); glidescope used 03/27/10  . DVT (deep venous thrombosis) ([place]) ~ 2013   "BLE"  . Dysrhythmia    A. Fib  . Emphysema of lung ([place])   . Enlarged prostate   . Falls frequently    > 20 times in the last year/notes 10/15/2014  . GERD (gastroesophageal reflux disease)   . Heart murmur   . History of blood transfusion 1960   "lots; related to an accident"  . History of echocardiogram    post AVR >> Echo 7/16:  Mild LVH, EF 20-25%, AVR ok (peak 15 mmHg, mean 9 mmHg), MAC, mild MR, severe LAE, mild reduced RVSF, mild RAE, PASP 35 mmHg  . Hypercholesteremia   . Hypertension   . Hypothyroidism   . OSA (obstructive sleep apnea)    "suppose to wear mask; I throw it off in my sleep" (10/15/2014)  . Plantar fasciitis   . Right fibular fracture 10/15/2014  . Seasonal allergies   . Shortness  of breath dyspnea   . Syncope 10/15/2014  . Syncope and collapse "several times"  . Urine frequency   . Urine incontinence   . Varicose veins    Past Surgical History:  Procedure Laterality Date  . ABDOMINAL AORTIC ANEURYSM REPAIR  01/2006; 03/10/2006   Archie Endo 01/12/2011  . AORTIC VALVE REPLACEMENT N/A 02/11/2015   Procedure: AORTIC VALVE REPLACEMENT (AVR);  Surgeon: Grace Isaac, MD;  Location: [facility];  Service: Open Heart Surgery;  Laterality: N/A;  . BRAIN SURGERY  1960 X 4   "S/P got my skull busted"; pt. states removed internal carotid artery  . CARDIAC CATHETERIZATION  1990's X 1; 09/2014   no stents  . CLIPPING OF ATRIAL  APPENDAGE N/A 02/11/2015   Procedure: CLIPPING OF ATRIAL APPENDAGE;  Surgeon: Grace Isaac, MD;  Location: [facility];  Service: Open Heart Surgery;  Laterality: N/A;  . ERCP  11/2009   Archie Endo 12/05/2009  . gall stone    . HERNIA REPAIR    . LAPAROSCOPIC CHOLECYSTECTOMY  08/2009   w/IOC/notes 09/18/2009  . LAPAROSCOPIC INCISIONAL / UMBILICAL / VENTRAL HERNIA REPAIR  02/2010   VHR w/mesh/notes 03/28/2010  . NOSE SURGERY    . TEE WITHOUT CARDIOVERSION N/A 02/11/2015   Procedure: TRANSESOPHAGEAL ECHOCARDIOGRAM (TEE);  Surgeon: Grace Isaac, MD;  Location: [facility];  Service: Open Heart Surgery;  Laterality: N/A;  . TONSILLECTOMY      Allergies  Allergen Reactions  . Haldol [Haloperidol Lactate]     Over-sedation and at times agitation     Outpatient Encounter Medications as of 09/29/2018  Medication Sig  . apixaban (ELIQUIS) 2.5 MG TABS tablet Take 1 tablet (2.5 mg total) by mouth 2 (two) times daily.  Marland Kitchen atorvastatin (LIPITOR) 10 MG tablet TAKE 1 TABLET BY MOUTH EVERY DAY  . carvedilol (COREG) 25 MG tablet Take 25 mg by mouth 2 (two) times daily with a meal.  . dutasteride (AVODART) 0.5 MG capsule TAKE 1 CAPSULE BY MOUTH EVERY DAY  . folic acid (FOLVITE) 1 MG tablet TAKE 1 TABLET BY MOUTH EVERY DAY  . furosemide (LASIX) 20 MG tablet TAKE 1 TABLET BY MOUTH DAILY FOR WEIGHT GAIN 3 POUNDS IN 24 HOURS  . ipratropium-albuterol (DUONEB) 0.5-2.5 (3) MG/3ML SOLN USE 1 VIAL IN NEBULIZER EVERY 4 HOURS AS NEEDED  . KLOR-CON M10 10 MEQ tablet TAKE 1 TABLET BY MOUTH EVERY DAY  . levothyroxine (SYNTHROID, LEVOTHROID) 200 MCG tablet Take 200 mcg by mouth daily before breakfast.  . memantine (NAMENDA XR) 28 MG CP24 24 hr capsule Take 1 capsule (28 mg total) by mouth daily.  . mirtazapine (REMERON) 7.5 MG tablet Take 15 mg by mouth at bedtime.   . Tiotropium Bromide Monohydrate (SPIRIVA RESPIMAT) 2.5 MCG/ACT AERS 2 puffs daily  . [DISCONTINUED] levothyroxine (SYNTHROID, LEVOTHROID) 200 MCG tablet TAKE 1  TABLET BY MOUTH EVERY DAY (ALONG WITH 25MCG TABLET)  . [DISCONTINUED] Memantine HCl-Donepezil HCl (NAMZARIC PO) Take 1 tablet by mouth daily.   No facility-administered encounter medications on file as of 09/29/2018.     Review of Systems  Constitutional: Positive for fatigue. Negative for appetite change, chills and fever.       Chronic fatigue   HENT: Negative for congestion, postnasal drip, rhinorrhea, sinus pressure, sinus pain, sneezing and sore throat.   Eyes: Positive for visual disturbance. Negative for pain, discharge, redness and itching.       Wears eye glasses   Respiratory: Negative for cough, chest tightness and wheezing.  Chronic shortness of breath with exertion   Cardiovascular: Negative for chest pain and palpitations.       Chronic leg swelling   Gastrointestinal: Negative for abdominal distention, abdominal pain, constipation, diarrhea, nausea and vomiting.  Endocrine: Negative for cold intolerance, heat intolerance, polydipsia, polyphagia and polyuria.  Genitourinary: Negative for dysuria, flank pain, frequency and urgency.  Musculoskeletal: Positive for gait problem.  Skin: Negative for color change, pallor and rash.       Left groin skin red and itchy  Neurological: Negative for dizziness, light-headedness and headaches.  Psychiatric/Behavioral: Positive for confusion. Negative for agitation and sleep disturbance. The patient is not nervous/anxious.     Immunization History  Administered Date(s) Administered  . Influenza, High Dose Seasonal PF 05/29/2013, 06/09/2016, 05/05/2018  . Influenza-Unspecified 04/30/2014, 08/15/2014  . Pneumococcal Conjugate-13 08/15/2014  . Pneumococcal Polysaccharide-23 04/29/2010   Pertinent  Health Maintenance Due  Topic Date Due  . INFLUENZA VACCINE  Completed  . PNA vac Low Risk Adult  Completed   Fall Risk  09/29/2018 07/12/2018 06/27/2018 01/27/2016 12/24/2015  Falls in the past year? 1 0 Yes (No Data) Yes  Comment - -  - Patient reports no recent falls.  Has completed PT for balance/gait issues. -  Number falls in past yr: 0 - 1 - 2 or more  Comment - - - - -  Injury with Fall? 0 - No - No  Risk Factor Category  - - - - High Fall Risk  Risk for fall due to : - - - History of fall(s);Impaired balance/gait;Medication side effect History of fall(s);Medication side effect;Impaired balance/gait  Risk for fall due to: Comment - - - - -  Follow up - - - Falls prevention discussed Education provided;Falls prevention discussed  Comment - - - - -    Vitals:   09/29/18 1001  BP: 110/80  Pulse: 64  Temp: 98.2 F (36.8 C)  SpO2: 97%  Weight: 259 lb 12.8 oz (117.8 kg)  Height: 6' (1.829 m)   Body mass index is 35.24 kg/m. Physical Exam Vitals signs reviewed.  Constitutional:      General: He is not in acute distress.    Appearance: He is obese. He is not ill-appearing.  HENT:     Head: Normocephalic.     Right Ear: Tympanic membrane, ear canal and external ear normal. There is no impacted cerumen.     Ears:     Comments: Left ear slightly impacted with cerumen son states will arrange for removal.     Nose: Nose normal. No congestion or rhinorrhea.     Mouth/Throat:     Mouth: Mucous membranes are moist.     Pharynx: Oropharynx is clear. No oropharyngeal exudate or posterior oropharyngeal erythema.  Eyes:     General: No scleral icterus.       Right eye: No discharge.        Left eye: No discharge.     Conjunctiva/sclera: Conjunctivae normal.     Pupils: Pupils are equal, round, and reactive to light.  Neck:     Musculoskeletal: Normal range of motion. No muscular tenderness.  Cardiovascular:     Rate and Rhythm: Normal rate and regular rhythm.     Pulses: Normal pulses.     Heart sounds: Normal heart sounds. No murmur. No friction rub. No gallop.   Pulmonary:     Effort: Pulmonary effort is normal. No respiratory distress.     Breath sounds: Normal breath sounds. No wheezing, rhonchi  or rales.      Comments: Potable oxygen via nasal cannula in place  Chest:     Chest wall: No tenderness.  Abdominal:     General: Bowel sounds are normal. There is no distension.     Palpations: Abdomen is soft. There is no mass.     Tenderness: There is no abdominal tenderness. There is no right CVA tenderness, left CVA tenderness, guarding or rebound.  Musculoskeletal:        General: No tenderness.     Comments: Moves x 4 extremities.unsteady gait on wheelchair during visit.leg edema right <left   Lymphadenopathy:     Cervical: No cervical adenopathy.  Skin:    General: Skin is warm and dry.     Coloration: Skin is not pale.     Findings: No rash.     Comments: Right groin area skin redness,moist with strong odor.   Neurological:     Mental Status: He is alert and oriented to person, place, and time.     Coordination: Coordination normal.     Gait: Gait abnormal.  Psychiatric:        Mood and Affect: Mood normal.        Behavior: Behavior normal.        Thought Content: Thought content normal.        Judgment: Judgment normal.    Labs reviewed: Recent Labs    01/28/18 0348  04/27/18 0416  05/01/18 0425 05/02/18 0432 05/03/18 0428  06/27/18 1448 07/12/18 1002 09/04/18 0105  NA 141   < > 141   < > 137 138 138   < > 143 144 141  K 3.9   < > 4.2   < > 3.9 4.1 4.2   < > 4.2 4.5 4.2  CL 106   < > 100   < > 99 98 97*   < > 102 105 105  CO2 26   < > 32   < > 28 32 32   < > 34* 33* 27  GLUCOSE 109*   < > 125*   < > 96 105* 114*   < > 108 105 122*  BUN 23*   < > 25*   < > _0 < > 30* 24 27*  CREATININE 1.65*   < > 2.34*   < > 1.86* 2.04* 1.88*   < > 1.74* 1.66* 1.78*  CALCIUM 8.3*   < > 9.0   < > 8.3* 8.4* 8.8*   < > 9.1 8.9 9.0  MG 2.0  --  2.1  --   --   --  2.3  --   --   --   --   PHOS 2.8  --  2.8   < > 3.9 3.6 3.8  --   --   --   --    < > = values in this interval not displayed.   Recent Labs    04/27/18 0416  05/03/18 0428 05/04/18 0333 07/12/18 1002  09/04/18 0353  AST 37  --   --  26 13 149*  ALT 19  --   --  18 14 58*  ALKPHOS 80  --   --  65  --  160*  BILITOT 0.9  --   --  0.8 0.5 1.2  PROT 7.1  --   --  6.3* 6.4 6.3*  ALBUMIN 3.9   < > 3.7 3.4*  --  2.9*   < > =  values in this interval not displayed.   Recent Labs    04/26/18 0503 04/27/18 0416  05/08/18 0419 05/15/18 07/12/18 1002 09/04/18 0105  WBC 6.4 6.2   < > 4.9 5.2 5.4 6.8  NEUTROABS 3.2 3.4  --   --   --  3,737  --   HGB 12.5* 12.4*   < > 11.2* 11.6* 9.8* 11.5*  HCT 40.2 39.4   < > 35.9* 35* 29.9* 38.8*  MCV 99.3 98.3   < > 98.4  --  94.0 94.9  PLT 181 186   < > 167 175 176 181   < > = values in this interval not displayed.   Lab Results  Component Value Date   TSH 0.37 (L) 07/12/2018   Lab Results  Component Value Date   HGBA1C 5.7 (H) 01/28/2018   Lab Results  Component Value Date   CHOL 87 07/12/2018   HDL 28 (L) 07/12/2018   LDLCALC 41 07/12/2018   TRIG 92 07/12/2018   CHOLHDL 3.1 07/12/2018    Significant Diagnostic Results in last 30 days:  Dg Chest 2 View  Result Date: 09/04/2018 CLINICAL DATA:  Shortness of breath. EXAM: CHEST - 2 VIEW COMPARISON:  05/04/2018 FINDINGS: Post median sternotomy. Stable cardiomegaly and prosthetic aortic valve. Prior left atrial clipping. Similar vascular congestion to prior exam. Right basilar scarring. No large pleural effusion or pneumothorax. IMPRESSION: 1. Unchanged cardiomegaly and vascular congestion compared to exam 4 months ago. 2. Right basilar scarring. Electronically Signed   By: Keith Rake M.D.   On: 09/04/2018 01:33    Assessment/Plan 1. UTI symptoms Afebrile. - urine culture and sensitivity  - CBC with Differential/Platelet - Urinalysis, Routine w reflex microscopic - Encourage fluid intake.   2. Confusion Answered questions appropriately during visit but falls asleep in the middle of his speech.VSS.Lungs CTA. - CBC with Differential/Platelet - CMP with eGFR([facility])  3. Candidiasis  of skin  Right groin area beefy red.moist with strong odor.Apply Nystatin 100,000 units cream one application twice daily x 14 days. - encouraged to dry skin folds after showering.   Family/ staff Communication: Reviewed plan of care with patient and son.    Labs/tests ordered: CBC/diff,CMP today.    Follow up: as needed   Sandrea Hughs, NP

## 2018-09-29 NOTE — Telephone Encounter (Signed)
Will collect urine specimen during visit to rule out UTI.

## 2018-09-29 NOTE — Patient Instructions (Signed)

## 2018-10-02 ENCOUNTER — Other Ambulatory Visit: Payer: Self-pay | Admitting: *Deleted

## 2018-10-02 MED ORDER — LEVOTHYROXINE SODIUM 175 MCG PO TABS: 175.0000 ug | ORAL_TABLET | Freq: Every day | ORAL | 0 refills | Status: DC

## 2018-10-02 NOTE — Telephone Encounter (Signed)
Notes recorded by Sharon Seller, NP on 10/02/2018 at 9:21 AM EST TSH is still low, lets reduce synthroid to 175 mcg at this time and follow up TSH in 8 weeks due to hypothyroid  Patient son notified and Rx sent to pharmacy.

## 2018-10-13 ENCOUNTER — Other Ambulatory Visit: Payer: Self-pay

## 2018-10-13 DIAGNOSIS — R7989 Other specified abnormal findings of blood chemistry: Secondary | ICD-10-CM

## 2018-10-13 DIAGNOSIS — R945 Abnormal results of liver function studies: Secondary | ICD-10-CM

## 2018-10-16 ENCOUNTER — Other Ambulatory Visit: Payer: Self-pay | Admitting: Internal Medicine

## 2018-10-16 ENCOUNTER — Other Ambulatory Visit: Payer: Medicare Other

## 2018-10-16 DIAGNOSIS — R945 Abnormal results of liver function studies: Secondary | ICD-10-CM

## 2018-10-16 DIAGNOSIS — R7989 Other specified abnormal findings of blood chemistry: Secondary | ICD-10-CM

## 2018-10-16 LAB — COMPLETE METABOLIC PANEL WITH GFR
AG Ratio: 1.1 (calc) (ref 1.0–2.5)
ALT: 43 U/L (ref 9–46)
AST: 28 U/L (ref 10–35)
Albumin: 3.4 g/dL — ABNORMAL LOW (ref 3.6–5.1)
Alkaline phosphatase (APISO): 455 U/L — ABNORMAL HIGH (ref 35–144)
BUN/Creatinine Ratio: 15 (calc) (ref 6–22)
BUN: 22 mg/dL (ref 7–25)
CO2: 28 mmol/L (ref 20–32)
Calcium: 8.9 mg/dL (ref 8.6–10.3)
Chloride: 108 mmol/L (ref 98–110)
Creat: 1.51 mg/dL — ABNORMAL HIGH (ref 0.70–1.11)
GFR, Est African American: 48 mL/min/{1.73_m2} — ABNORMAL LOW (ref >=60)
GFR, Est Non African American: 42 mL/min/{1.73_m2} — ABNORMAL LOW (ref >=60)
Globulin: 3.1 g/dL (calc) (ref 1.9–3.7)
Glucose, Bld: 104 mg/dL — ABNORMAL HIGH (ref 65–99)
Potassium: 4.4 mmol/L (ref 3.5–5.3)
Sodium: 143 mmol/L (ref 135–146)
Total Bilirubin: 1 mg/dL (ref 0.2–1.2)
Total Protein: 6.5 g/dL (ref 6.1–8.1)

## 2018-10-23 ENCOUNTER — Other Ambulatory Visit: Payer: Self-pay | Admitting: Internal Medicine

## 2018-10-25 ENCOUNTER — Other Ambulatory Visit: Payer: Self-pay | Admitting: Nurse Practitioner

## 2018-10-30 ENCOUNTER — Telehealth: Payer: Self-pay

## 2018-10-30 NOTE — Telephone Encounter (Signed)
-----   Message from Sharon Seller, NP sent at 10/25/2018  3:54 PM EST ----- Lets have him follow up in 6 (to 8) weeks from his last OV  ----- Message ----- From: Maurice Small, CMA Sent: 10/23/2018   2:51 PM EST To: Sharon Seller, NP  Please advise when patient should follow-up  I was refilling a medication and he does not have a pending appointment, yet I did not see an indication where he was told to follow-up   Thanks,  CB

## 2018-10-31 ENCOUNTER — Other Ambulatory Visit: Payer: Self-pay | Admitting: Nurse Practitioner

## 2018-11-07 NOTE — Telephone Encounter (Signed)
Spoke with patient's son, scheduled appointment for November 14, 2018.  I scheduled patient for a 5 month follow-up. Please advise if visit type should be changed to AWV, per instructions from 06/27/2018 visit

## 2018-11-07 NOTE — Telephone Encounter (Signed)
Yes lets schedule AWV with the follow up please

## 2018-11-08 NOTE — Telephone Encounter (Signed)
No availability before or after to add AWV to follow-up, I was questioning if you would like follow-up to be scheduled as a AWV as it appears that patient did not schedule AWV as instructed on 06/28/2019

## 2018-11-08 NOTE — Telephone Encounter (Signed)
Yes, they can come in whenever for this

## 2018-11-10 NOTE — Telephone Encounter (Signed)
Patient can be instructed to schedule AWV at upcoming appt

## 2018-11-14 ENCOUNTER — Encounter: Payer: Self-pay | Admitting: Nurse Practitioner

## 2018-11-14 ENCOUNTER — Ambulatory Visit (INDEPENDENT_AMBULATORY_CARE_PROVIDER_SITE_OTHER): Payer: Medicare Other | Admitting: Nurse Practitioner

## 2018-11-14 ENCOUNTER — Other Ambulatory Visit: Payer: Self-pay

## 2018-11-14 VITALS — BP 144/88 | HR 54 | Temp 97.8°F | Ht 72.0 in | Wt 260.4 lb

## 2018-11-14 DIAGNOSIS — I10 Essential (primary) hypertension: Secondary | ICD-10-CM

## 2018-11-14 DIAGNOSIS — Z6835 Body mass index (BMI) 35.0-35.9, adult: Secondary | ICD-10-CM

## 2018-11-14 DIAGNOSIS — I5032 Chronic diastolic (congestive) heart failure: Secondary | ICD-10-CM

## 2018-11-14 DIAGNOSIS — I480 Paroxysmal atrial fibrillation: Secondary | ICD-10-CM | POA: Diagnosis not present

## 2018-11-14 DIAGNOSIS — R945 Abnormal results of liver function studies: Secondary | ICD-10-CM

## 2018-11-14 DIAGNOSIS — E034 Atrophy of thyroid (acquired): Secondary | ICD-10-CM

## 2018-11-14 DIAGNOSIS — E785 Hyperlipidemia, unspecified: Secondary | ICD-10-CM

## 2018-11-14 DIAGNOSIS — N183 Chronic kidney disease, stage 3 unspecified: Secondary | ICD-10-CM

## 2018-11-14 DIAGNOSIS — G309 Alzheimer's disease, unspecified: Secondary | ICD-10-CM

## 2018-11-14 DIAGNOSIS — I2510 Atherosclerotic heart disease of native coronary artery without angina pectoris: Secondary | ICD-10-CM

## 2018-11-14 DIAGNOSIS — R918 Other nonspecific abnormal finding of lung field: Secondary | ICD-10-CM

## 2018-11-14 DIAGNOSIS — J449 Chronic obstructive pulmonary disease, unspecified: Secondary | ICD-10-CM

## 2018-11-14 DIAGNOSIS — F0281 Dementia in other diseases classified elsewhere with behavioral disturbance: Secondary | ICD-10-CM

## 2018-11-14 DIAGNOSIS — R7989 Other specified abnormal findings of blood chemistry: Secondary | ICD-10-CM

## 2018-11-14 NOTE — Progress Notes (Signed)
Careteam: Patient Care Team: Lauree Chandler, NP as PCP - General (Geriatric Medicine) Nahser, [name], MD as PCP - Cardiology (Cardiology) Nahser, [name], MD as Consulting Physician (Cardiology) [name], MD as Referring Physician (Urology)  Advanced Directive information    Allergies  Allergen Reactions  . Haldol [Haloperidol Lactate]     Over-sedation and at times agitation     Chief Complaint  Patient presents with  . Medical Management of Chronic Issues    Medical management of Chronic Issue, Alzheimer's, HTN, Thyroid      HPI: Patient is a 85 y.o. male seen in the office today for routine follow up. He is here today with his son.   A-fib, CAD, CHF - Pt saw cardilogist about a year ago Dr. Acie Fredrickson, denies any increase in shortness of breath, chest pains, leg swelling.   Alzheimer's dementia- stable, without acute changes in memory loss.  Son feels like overall his function is actually  slowly improving, best it has been in a while over the last 2 weeks. Pt takes Namenda XR 28 mg  Thyroid dysfunction- TSH low (0.10). Synthroid was reduced  to 175 mcg on 10/02/2018.  HTN - elevated on initial check, recheck 138/80 pt takes Coreg, lasix. Currently stable condition.   Orthostatic hypotension/debility/weakness- pt reports lightheadedness with changing position from sitting to standing. No recent falls. Pt does not use his walker as recommended.  Has completed home health PT   Review of Systems:  Review of Systems  Constitutional: Negative for chills, fever and malaise/fatigue.  HENT: Negative for congestion, ear discharge, ear pain, sinus pain and sore throat.   Eyes: Negative for pain and discharge.  Respiratory: Negative for cough, shortness of breath and wheezing.   Cardiovascular: Positive for leg swelling (chronic to right leg). Negative for chest pain, palpitations, orthopnea and claudication.       Right leg swelling  Gastrointestinal: Positive  for constipation. Negative for abdominal pain, diarrhea, heartburn, nausea and vomiting.  Genitourinary: Negative for flank pain, frequency, hematuria and urgency.  Skin: Positive for itching. Negative for rash.  Neurological: Positive for headaches. Negative for dizziness and weakness.  Psychiatric/Behavioral: The patient is not nervous/anxious and does not have insomnia.     Past Medical History:  Diagnosis Date  . Anxiety   . Aortic valve insufficiency   . Arthritis    "wee bit; knees, elbows" (10/15/2014)  . Atrial fibrillation ([place])   . Cardiomyopathy ([place])   . Chronic kidney disease    "down to ~ 1/2 of their regular use; I see kidney dr. in [place]" (10/15/2014)  . Complication of anesthesia    unsure of complications but pt. states there were complications  . COPD (chronic obstructive pulmonary disease) ([place])    on home o2  . Coronary artery disease   . Decreased testosterone level   . Depression   . Difficult intubation    difficult intubation 12/03/09 and 03/27/10 ([facility]); glidescope used 03/27/10  . DVT (deep venous thrombosis) ([place]) ~ 2013   "BLE"  . Dysrhythmia    A. Fib  . Emphysema of lung ([place])   . Enlarged prostate   . Falls frequently    > 20 times in the last year/notes 10/15/2014  . GERD (gastroesophageal reflux disease)   . Heart murmur   . History of blood transfusion 1960   "lots; related to an accident"  . History of echocardiogram    post AVR >> Echo 7/16:  Mild LVH,  EF 20-25%, AVR ok (peak 15 mmHg, mean 9 mmHg), MAC, mild MR, severe LAE, mild reduced RVSF, mild RAE, PASP 35 mmHg  . Hypercholesteremia   . Hypertension   . Hypothyroidism   . OSA (obstructive sleep apnea)    "suppose to wear mask; I throw it off in my sleep" (10/15/2014)  . Plantar fasciitis   . Right fibular fracture 10/15/2014  . Seasonal allergies   . Shortness of breath dyspnea   . Syncope 10/15/2014  . Syncope and collapse "several times"  . Urine frequency   . Urine  incontinence   . Varicose veins    Past Surgical History:  Procedure Laterality Date  . ABDOMINAL AORTIC ANEURYSM REPAIR  01/2006; 03/10/2006   Archie Endo 01/12/2011  . AORTIC VALVE REPLACEMENT N/A 02/11/2015   Procedure: AORTIC VALVE REPLACEMENT (AVR);  Surgeon: Grace Isaac, MD;  Location: [facility];  Service: Open Heart Surgery;  Laterality: N/A;  . BRAIN SURGERY  1960 X 4   "S/P got my skull busted"; pt. states removed internal carotid artery  . CARDIAC CATHETERIZATION  1990's X 1; 09/2014   no stents  . CLIPPING OF ATRIAL APPENDAGE N/A 02/11/2015   Procedure: CLIPPING OF ATRIAL APPENDAGE;  Surgeon: Grace Isaac, MD;  Location: [facility];  Service: Open Heart Surgery;  Laterality: N/A;  . ERCP  11/2009   Archie Endo 12/05/2009  . gall stone    . HERNIA REPAIR    . LAPAROSCOPIC CHOLECYSTECTOMY  08/2009   w/IOC/notes 09/18/2009  . LAPAROSCOPIC INCISIONAL / UMBILICAL / VENTRAL HERNIA REPAIR  02/2010   VHR w/mesh/notes 03/28/2010  . NOSE SURGERY    . TEE WITHOUT CARDIOVERSION N/A 02/11/2015   Procedure: TRANSESOPHAGEAL ECHOCARDIOGRAM (TEE);  Surgeon: Grace Isaac, MD;  Location: [facility];  Service: Open Heart Surgery;  Laterality: N/A;  . TONSILLECTOMY     Social History:   reports that he quit smoking about 44 years ago. His smoking use included cigarettes. He has a 44.00 pack-year smoking history. He has never used smokeless tobacco. He reports previous alcohol use. He reports that he does not use drugs.  Family History  Problem Relation Age of Onset  . Rheum arthritis Mother   . Diabetes Mother   . Heart disease Father   . Rheum arthritis Father   . Kidney disease Father   . Other Sister        Murdered  . Breast cancer Sister   . Colon cancer Neg Hx   . Colon polyps Neg Hx   . Liver disease Neg Hx     Medications: Patient's Medications  New Prescriptions   No medications on file  Previous Medications   APIXABAN (ELIQUIS) 2.5 MG TABS TABLET    Take 1 tablet (2.5 mg total) by mouth  2 (two) times daily.   CARVEDILOL (COREG) 25 MG TABLET    TAKE 1 TABLET BY MOUTH TWICE A DAY   DUTASTERIDE (AVODART) 0.5 MG CAPSULE    TAKE 1 CAPSULE BY MOUTH EVERY DAY   FUROSEMIDE (LASIX) 20 MG TABLET    TAKE 1 TABLET BY MOUTH DAILY FOR WEIGHT GAIN 3 POUNDS IN 24 HOURS   IPRATROPIUM-ALBUTEROL (DUONEB) 0.5-2.5 (3) MG/3ML SOLN    USE 1 VIAL IN NEBULIZER EVERY 4 HOURS AS NEEDED   KLOR-CON M10 10 MEQ TABLET    TAKE 1 TABLET BY MOUTH EVERY DAY   LEVOTHYROXINE (SYNTHROID, LEVOTHROID) 175 MCG TABLET    TAKE 1 TABLET (175 MCG TOTAL) BY MOUTH DAILY BEFORE  BREAKFAST.   MEMANTINE (NAMENDA XR) 28 MG CP24 24 HR CAPSULE    TAKE 1 CAPSULE (28 MG TOTAL) BY MOUTH DAILY.   TIOTROPIUM BROMIDE MONOHYDRATE (SPIRIVA RESPIMAT) 2.5 MCG/ACT AERS    2 puffs daily  Modified Medications   No medications on file  Discontinued Medications   FOLIC ACID (FOLVITE) 1 MG TABLET    TAKE 1 TABLET BY MOUTH EVERY DAY   MIRTAZAPINE (REMERON) 7.5 MG TABLET    Take 15 mg by mouth at bedtime.      Physical Exam:  Vitals:   11/14/18 1304  BP: (!) 144/88  Pulse: (!) 54  Temp: 97.8 F (36.6 C)  TempSrc: Oral  SpO2: 92%  Weight: 260 lb 6.4 oz (118.1 kg)  Height: 6' (1.829 m)   Body mass index is 35.32 kg/m.  Physical Exam Vitals signs reviewed.  Constitutional:      General: He is not in acute distress.    Appearance: Normal appearance. He is normal weight. He is not ill-appearing.  HENT:     Head: Normocephalic.     Right Ear: Tympanic membrane, ear canal and external ear normal.     Left Ear: Tympanic membrane, ear canal and external ear normal.     Nose: Congestion present.     Mouth/Throat:     Mouth: Mucous membranes are moist.     Pharynx: Oropharynx is clear.  Eyes:     General: No scleral icterus.       Right eye: No discharge.        Left eye: No discharge.     Extraocular Movements: Extraocular movements intact.     Conjunctiva/sclera: Conjunctivae normal.     Pupils: Pupils are equal, round, and  reactive to light.  Neck:     Musculoskeletal: Normal range of motion.  Cardiovascular:     Rate and Rhythm: Normal rate. Rhythm irregular.     Pulses: Normal pulses.  Pulmonary:     Effort: Pulmonary effort is normal. No respiratory distress.     Breath sounds: Normal breath sounds. No wheezing, rhonchi or rales.  Chest:     Chest wall: No tenderness.  Abdominal:     General: Bowel sounds are normal.     Palpations: Abdomen is soft.     Tenderness: There is no abdominal tenderness.  Musculoskeletal:        General: Swelling present.     Right lower leg: Edema present.  Skin:    General: Skin is warm and dry.  Neurological:     Mental Status: He is alert and oriented to person, place, and time. Mental status is at baseline.  Psychiatric:        Mood and Affect: Mood normal.        Thought Content: Thought content normal.     Labs reviewed: Basic Metabolic Panel: Recent Labs    01/28/18 0348  04/27/18 0416  05/01/18 0425 05/02/18 0432 05/03/18 0428  06/27/18 1448 07/12/18 1002 09/04/18 0105 09/29/18 1059 09/29/18 1103 10/16/18 0831  NA 141   < > 141   < > 137 138 138   < > 143 144 141 141  --  143  K 3.9   < > 4.2   < > 3.9 4.1 4.2   < > 4.2 4.5 4.2 4.7  --  4.4  CL 106   < > 100   < > 99 98 97*   < > 102 105 105 104  --  108  CO2 26   < > 32   < > 28 32 32   < > 34* 33* 27 29  --  28  GLUCOSE 109*   < > 125*   < > 96 105* 114*   < > 108 105 122* 102*  --  104*  BUN 23*   < > 25*   < > 20 21 18    < > 30* 24 27* 18  --  22  CREATININE 1.65*   < > 2.34*   < > 1.86* 2.04* 1.88*   < > 1.74* 1.66* 1.78* 1.75*  --  1.51*  CALCIUM 8.3*   < > 9.0   < > 8.3* 8.4* 8.8*   < > 9.1 8.9 9.0 9.2  --  8.9  MG 2.0  --  2.1  --   --   --  2.3  --   --   --   --   --   --   --   PHOS 2.8  --  2.8   < > 3.9 3.6 3.8  --   --   --   --   --   --   --   TSH 3.852   < >  --    < >  --   --   --    < > 0.39* 0.37*  --   --  0.10*  --    < > = values in this interval not displayed.    Liver Function Tests: Recent Labs    04/27/18 0416  05/03/18 0428 05/04/18 0333  09/04/18 0353 09/29/18 1059 10/16/18 0831  AST 37  --   --  26   < > 149* 247* 28  ALT 19  --   --  18   < > 58* 222* 43  ALKPHOS 80  --   --  65  --  160*  --   --   BILITOT 0.9  --   --  0.8   < > 1.2 1.2 1.0  PROT 7.1  --   --  6.3*   < > 6.3* 6.7 6.5  ALBUMIN 3.9   < > 3.7 3.4*  --  2.9*  --   --    < > = values in this interval not displayed.   Recent Labs    09/04/18 0353  LIPASE 32   No results for input(s): AMMONIA in the last 8760 hours. CBC: Recent Labs    04/27/18 0416  07/12/18 1002 09/04/18 0105 09/29/18 1059  WBC 6.2   < > 5.4 6.8 4.4  NEUTROABS 3.4  --  3,737  --  2,768  HGB 12.4*   < > 9.8* 11.5* 11.7*  HCT 39.4   < > 29.9* 38.8* 35.8*  MCV 98.3   < > 94.0 94.9 88.2  PLT 186   < > 176 181 175   < > = values in this interval not displayed.   Lipid Panel: Recent Labs    07/12/18 1002  CHOL 87  HDL 28*  LDLCALC 41  TRIG 92  CHOLHDL 3.1   TSH: Recent Labs    06/27/18 1448 07/12/18 1002 09/29/18 1103  TSH 0.39* 0.37* 0.10*   A1C: Lab Results  Component Value Date   HGBA1C 5.7 (H) 01/28/2018     Assessment/Plan 1. Body mass index (BMI) of 35.0-35.9 in adult Noted today, discussed healthy habits with eating and increasing activity.   2. Class 2  severe obesity due to excess calories with serious comorbidity and body mass index (BMI) of 35.0 to 35.9 in adult [facility]) Pt encouraged to decrease calorie intake and increase mobility. He has actually lost weight per son but currently maintains Pt encouraged to reduce calorie intake and try increase activity. Pt will most likely lose weight with disease progression of dementia.   3. Chronic diastolic heart failure (HCC) Stable condition, Pt takes lasix currently, no increased SOB, there is a swelling to the right leg due to the past surgical procedures and this has been stable.   4. Coronary artery disease  involving native coronary artery of native heart without angina pectoris Stable condition, Pt follows up with cardiologist Dr. Cathie Olden. No ongoing chest pains.   5. Essential hypertension Stable, improved on recheck. Pt takes Coreg and Lasix. Pt encouraged to modify the diet and reduce salt intake.   6. Paroxysmal A-fib (HCC) Stable, rate controlled, continues on eliquis and coreg  7. COPD (chronic obstructive pulmonary disease) with chronic bronchitis (HCC) Stable, pt denies any SOB, wheexing or increased used of inhalers. Pt is currently on cont. 3 L [place] with sats at 92%  8. Hypothyroidism due to acquired atrophy of thyroid Levothyroixine was adjust about 6 weeks ago due to low TSH . Rechecking TSH today - TSH  9. Alzheimer's dementia with behavioral disturbance, unspecified timing of dementia onset ([place]) Ongoing, Pt takes Namenda. Per son report, pt's cognitive condition slowly improving  10. CKD (chronic kidney disease) stage 3, GFR 30-59 ml/min (HCC) Ongoing condition, rechecking labs currently to evaluate the current state. Encouraged proper hydration.   11. Hyperlipidemia, unspecified hyperlipidemia type Stable condition, pt encouraged to modify the diet. Statin stopped due to elevated liver enzymes. Previously has been on lipitor for years  - COMPLETE METABOLIC PANEL WITH GFR - Lipid panel  12. Abnormal findings on diagnostic imaging of lung Pt was recommended to repeat CT in 2017. There was no follow up done. This was prior to son taking over care and pt with dementia and unsure of follow up.  - CT Chest Wo Contrast; Future  13. Elevated liver enzymes Improved on recent labs, does not use tylenol, no ETOH use, has stopped statin Will follow up CMP today  Next appt: 4 months Yarethzi Branan K. Harle Battiest I personally was present during the history, physical exam and medical decision-making activities of this service and have verified that the service and findings are accurately  documented in the student's note   [facility] [phone]

## 2018-11-14 NOTE — Patient Instructions (Addendum)
Stop remeron and monitor sleep and mood  Okay to stop folic acid.     Fat and Cholesterol Restricted Eating Plan Getting too much fat and cholesterol in your diet may cause health problems. Choosing the right foods helps keep your fat and cholesterol at normal levels. This can keep you from getting certain diseases.  Meal planning  At meals, divide your plate into four equal parts: ? Fill one-half of your plate with vegetables and green salads. ? Fill one-fourth of your plate with whole grains. ? Fill one-fourth of your plate with low-fat (lean) protein foods.  Eat fish that is high in omega-3 fats at least two times a week. This includes mackerel, tuna, sardines, and salmon.  Eat foods that are high in fiber, such as whole grains, beans, apples, broccoli, carrots, peas, and barley. General tips   Work with your doctor to lose weight if you need to.  Avoid: ? Foods with added sugar. ? Fried foods. ? Foods with partially hydrogenated oils.  Limit alcohol intake to no more than 1 drink a day for nonpregnant women and 2 drinks a day for men. One drink equals 12 oz of beer, 5 oz of wine, or 1 oz of hard liquor. Reading food labels  Check food labels for: ? Trans fats. ? Partially hydrogenated oils. ? Saturated fat (g) in each serving. ? Cholesterol (mg) in each serving. ? Fiber (g) in each serving.  Choose foods with healthy fats, such as: ? Monounsaturated fats. ? Polyunsaturated fats. ? Omega-3 fats.  Choose grain products that have whole grains. Look for the word "whole" as the first word in the ingredient list. Cooking  Cook foods using low-fat methods. These include baking, boiling, grilling, and broiling.  Eat more home-cooked foods. Eat at restaurants and buffets less often.  Avoid cooking using saturated fats, such as butter, cream, palm oil, palm kernel oil, and coconut oil. Recommended foods  Fruits  All fresh, canned (in natural juice), or frozen  fruits. Vegetables  Fresh or frozen vegetables (raw, steamed, roasted, or grilled). Green salads. Grains  Whole grains, such as whole wheat or whole grain breads, crackers, cereals, and pasta. Unsweetened oatmeal, bulgur, barley, quinoa, or brown rice. Corn or whole wheat flour tortillas. Meats and other protein foods  Ground beef (85% or leaner), grass-fed beef, or beef trimmed of fat. Skinless chicken or [place]. Ground chicken or [place]. Pork trimmed of fat. All fish and seafood. Egg whites. Dried beans, peas, or lentils. Unsalted nuts or seeds. Unsalted canned beans. Nut butters without added sugar or oil. Dairy  Low-fat or nonfat dairy products, such as skim or 1% milk, 2% or reduced-fat cheeses, low-fat and fat-free ricotta or cottage cheese, or plain low-fat and nonfat yogurt. Fats and oils  Tub margarine without trans fats. Light or reduced-fat mayonnaise and salad dressings. Avocado. Olive, canola, sesame, or safflower oils. The items listed above may not be a complete list of foods and beverages you can eat. Contact a dietitian for more information. Foods to avoid Fruits  Canned fruit in heavy syrup. Fruit in cream or butter sauce. Fried fruit. Vegetables  Vegetables cooked in cheese, cream, or butter sauce. Fried vegetables. Grains  White bread. White pasta. White rice. Cornbread. Bagels, pastries, and croissants. Crackers and snack foods that contain trans fat and hydrogenated oils. Meats and other protein foods  Fatty cuts of meat. Ribs, chicken wings, bacon, sausage, bologna, salami, chitterlings, fatback, hot dogs, bratwurst, and packaged lunch meats. Liver and organ  meats. Whole eggs and egg yolks. Chicken and [place] with skin. Fried meat. Dairy  Whole or 2% milk, cream, half-and-half, and cream cheese. Whole milk cheeses. Whole-fat or sweetened yogurt. Full-fat cheeses. Nondairy creamers and whipped toppings. Processed cheese, cheese spreads, and cheese curds.  Beverages  Alcohol. Sugar-sweetened drinks such as sodas, lemonade, and fruit drinks. Fats and oils  Butter, stick margarine, lard, shortening, ghee, or bacon fat. Coconut, palm kernel, and palm oils. Sweets and desserts  Corn syrup, sugars, honey, and molasses. Candy. Jam and jelly. Syrup. Sweetened cereals. Cookies, pies, cakes, donuts, muffins, and ice cream. The items listed above may not be a complete list of foods and beverages you should avoid. Contact a dietitian for more information. Summary  Choosing the right foods helps keep your fat and cholesterol at normal levels. This can keep you from getting certain diseases.  At meals, fill one-half of your plate with vegetables and green salads.  Eat high-fiber foods, like whole grains, beans, apples, carrots, peas, and barley.  Limit added sugar, saturated fats, alcohol, and fried foods. This information is not intended to replace advice given to you by your health care provider. Make sure you discuss any questions you have with your health care provider. Document Released: 02/15/2012 Document Revised: 04/19/2018 Document Reviewed: 05/03/2017 Elsevier Interactive Patient Education  2019 [employer].

## 2018-11-15 LAB — LIPID PANEL
Cholesterol: 126 mg/dL (ref <200)
HDL: 28 mg/dL — ABNORMAL LOW (ref >=40)
LDL Cholesterol (Calc): 75 mg/dL (calc)
Non-HDL Cholesterol (Calc): 98 mg/dL (calc) (ref <130)
Total CHOL/HDL Ratio: 4.5 (calc) (ref <5.0)
Triglycerides: 147 mg/dL (ref <150)

## 2018-11-15 LAB — COMPLETE METABOLIC PANEL WITH GFR
AG Ratio: 1.2 (calc) (ref 1.0–2.5)
ALT: 13 U/L (ref 9–46)
AST: 22 U/L (ref 10–35)
Albumin: 3.6 g/dL (ref 3.6–5.1)
Alkaline phosphatase (APISO): 177 U/L — ABNORMAL HIGH (ref 35–144)
BUN/Creatinine Ratio: 11 (calc) (ref 6–22)
BUN: 18 mg/dL (ref 7–25)
CO2: 27 mmol/L (ref 20–32)
CREATININE: 1.69 mg/dL — AB (ref 0.70–1.11)
Calcium: 8.7 mg/dL (ref 8.6–10.3)
Chloride: 106 mmol/L (ref 98–110)
GFR, Est African American: 42 mL/min/{1.73_m2} — ABNORMAL LOW (ref >=60)
GFR, Est Non African American: 36 mL/min/{1.73_m2} — ABNORMAL LOW (ref >=60)
Globulin: 2.9 g/dL (calc) (ref 1.9–3.7)
Glucose, Bld: 97 mg/dL (ref 65–139)
POTASSIUM: 5.1 mmol/L (ref 3.5–5.3)
Sodium: 141 mmol/L (ref 135–146)
Total Bilirubin: 0.6 mg/dL (ref 0.2–1.2)
Total Protein: 6.5 g/dL (ref 6.1–8.1)

## 2018-11-15 LAB — TSH: TSH: 7.15 mIU/L — ABNORMAL HIGH (ref 0.40–4.50)

## 2018-11-16 ENCOUNTER — Other Ambulatory Visit: Payer: Self-pay

## 2018-11-16 DIAGNOSIS — I480 Paroxysmal atrial fibrillation: Secondary | ICD-10-CM

## 2018-11-16 DIAGNOSIS — E034 Atrophy of thyroid (acquired): Secondary | ICD-10-CM

## 2018-11-18 ENCOUNTER — Other Ambulatory Visit: Payer: Self-pay | Admitting: Nurse Practitioner

## 2018-11-27 ENCOUNTER — Other Ambulatory Visit: Payer: Self-pay | Admitting: Nurse Practitioner

## 2018-11-27 ENCOUNTER — Other Ambulatory Visit: Payer: Self-pay

## 2018-11-27 MED ORDER — TIOTROPIUM BROMIDE MONOHYDRATE 2.5 MCG/ACT IN AERS: INHALATION_SPRAY | RESPIRATORY_TRACT | 5 refills | Status: DC

## 2018-12-12 ENCOUNTER — Telehealth: Payer: Self-pay

## 2018-12-12 ENCOUNTER — Other Ambulatory Visit: Payer: Self-pay

## 2018-12-12 ENCOUNTER — Encounter: Payer: Medicare Other | Admitting: Adult Health

## 2018-12-12 NOTE — Telephone Encounter (Signed)
Spoke with patient's son Ardelia Mems, he stated that he was not aware of his dad's appointment and no one had contacted him about his dad's condition. Son also went on to say he was the referred contact for patient and his dad was not capable of doing a visit alone. He stated he would like to canceled telephone visit with [facility] today. He stated he would contact us if he thought he needed to reschedule appointment.   In further investigation I spoke with Synetta Fail our triage nurse. She stated that  [facility] Nurse called and spoke  about patient's coughing and shortness of breath. Patient had stated to [facility] that he had been having shortness of breath and coughing for past 2 weeks. That is why the visit was set up for the patient. I then called patient's son again to let him know of the information. Son stated he had just seen his dad yesterday and patient had not mentioned being short of breath or cough. States patient has copd and shortness of breath is something patient deals with constantly. He stated he would check on his dad and see what was going on.

## 2018-12-13 ENCOUNTER — Other Ambulatory Visit: Payer: Self-pay

## 2018-12-13 ENCOUNTER — Ambulatory Visit
Admission: RE | Admit: 2018-12-13 | Discharge: 2018-12-13 | Disposition: A | Discharge status: Home / Self Care | Payer: Medicare Other | Source: Ambulatory Visit | Attending: Nurse Practitioner | Admitting: Nurse Practitioner

## 2018-12-13 DIAGNOSIS — R918 Other nonspecific abnormal finding of lung field: Secondary | ICD-10-CM

## 2018-12-15 NOTE — Progress Notes (Signed)
This encounter was created in error - please disregard.

## 2018-12-21 ENCOUNTER — Other Ambulatory Visit: Payer: Self-pay | Admitting: *Deleted

## 2018-12-21 ENCOUNTER — Other Ambulatory Visit: Payer: Self-pay | Admitting: Nurse Practitioner

## 2018-12-21 MED ORDER — LEVOTHYROXINE SODIUM 175 MCG PO TABS: 175.0000 ug | ORAL_TABLET | Freq: Every day | ORAL | 1 refills | Status: DC

## 2019-01-02 ENCOUNTER — Other Ambulatory Visit: Payer: Self-pay

## 2019-01-10 ENCOUNTER — Other Ambulatory Visit: Payer: Self-pay | Admitting: Nurse Practitioner

## 2019-01-10 ENCOUNTER — Encounter: Payer: Self-pay | Admitting: Nurse Practitioner

## 2019-01-10 NOTE — Telephone Encounter (Signed)
We do not have requested medication on list. Please advise   Routed to Sharon Seller, NP

## 2019-01-18 ENCOUNTER — Other Ambulatory Visit: Payer: Self-pay | Admitting: *Deleted

## 2019-01-18 ENCOUNTER — Other Ambulatory Visit: Payer: Self-pay | Admitting: Nurse Practitioner

## 2019-01-18 MED ORDER — CARVEDILOL 25 MG PO TABS: 25.0000 mg | ORAL_TABLET | Freq: Two times a day (BID) | ORAL | 3 refills | Status: DC

## 2019-01-18 NOTE — Telephone Encounter (Signed)
[facility] 

## 2019-01-23 ENCOUNTER — Other Ambulatory Visit: Payer: Self-pay

## 2019-01-31 ENCOUNTER — Other Ambulatory Visit: Payer: Self-pay | Admitting: Nurse Practitioner

## 2019-02-03 ENCOUNTER — Emergency Department (HOSPITAL_COMMUNITY)
Admission: EM | Admit: 2019-02-03 | Discharge: 2019-02-06 | Disposition: A | Discharge status: Home / Self Care | Payer: Medicare Other | Attending: Emergency Medicine | Admitting: Emergency Medicine

## 2019-02-03 ENCOUNTER — Encounter (HOSPITAL_COMMUNITY): Payer: Self-pay | Admitting: Emergency Medicine

## 2019-02-03 ENCOUNTER — Other Ambulatory Visit: Payer: Self-pay

## 2019-02-03 ENCOUNTER — Emergency Department (HOSPITAL_COMMUNITY): Payer: Medicare Other

## 2019-02-03 DIAGNOSIS — Z20828 Contact with and (suspected) exposure to other viral communicable diseases: Secondary | ICD-10-CM | POA: Insufficient documentation

## 2019-02-03 DIAGNOSIS — Z9049 Acquired absence of other specified parts of digestive tract: Secondary | ICD-10-CM | POA: Diagnosis not present

## 2019-02-03 DIAGNOSIS — Z7901 Long term (current) use of anticoagulants: Secondary | ICD-10-CM | POA: Insufficient documentation

## 2019-02-03 DIAGNOSIS — R531 Weakness: Secondary | ICD-10-CM | POA: Diagnosis not present

## 2019-02-03 DIAGNOSIS — Z79899 Other long term (current) drug therapy: Secondary | ICD-10-CM | POA: Insufficient documentation

## 2019-02-03 DIAGNOSIS — J449 Chronic obstructive pulmonary disease, unspecified: Secondary | ICD-10-CM | POA: Diagnosis not present

## 2019-02-03 DIAGNOSIS — I130 Hypertensive heart and chronic kidney disease with heart failure and stage 1 through stage 4 chronic kidney disease, or unspecified chronic kidney disease: Secondary | ICD-10-CM | POA: Diagnosis not present

## 2019-02-03 DIAGNOSIS — N183 Chronic kidney disease, stage 3 (moderate): Secondary | ICD-10-CM | POA: Insufficient documentation

## 2019-02-03 DIAGNOSIS — F0390 Unspecified dementia without behavioral disturbance: Secondary | ICD-10-CM | POA: Diagnosis not present

## 2019-02-03 DIAGNOSIS — Z952 Presence of prosthetic heart valve: Secondary | ICD-10-CM | POA: Diagnosis not present

## 2019-02-03 DIAGNOSIS — E039 Hypothyroidism, unspecified: Secondary | ICD-10-CM | POA: Insufficient documentation

## 2019-02-03 DIAGNOSIS — Z87891 Personal history of nicotine dependence: Secondary | ICD-10-CM | POA: Diagnosis not present

## 2019-02-03 DIAGNOSIS — I5032 Chronic diastolic (congestive) heart failure: Secondary | ICD-10-CM | POA: Insufficient documentation

## 2019-02-03 DIAGNOSIS — I2510 Atherosclerotic heart disease of native coronary artery without angina pectoris: Secondary | ICD-10-CM | POA: Insufficient documentation

## 2019-02-03 LAB — URINALYSIS, MICROSCOPIC (REFLEX): RBC / HPF: NONE SEEN RBC/hpf (ref 0–5)

## 2019-02-03 LAB — URINALYSIS, ROUTINE W REFLEX MICROSCOPIC
Glucose, UA: NEGATIVE mg/dL
Hgb urine dipstick: NEGATIVE
Ketones, ur: 15 mg/dL — AB
Nitrite: NEGATIVE
Protein, ur: 30 mg/dL — AB
Specific Gravity, Urine: 1.025 (ref 1.005–1.030)
pH: 5.5 (ref 5.0–8.0)

## 2019-02-03 LAB — BASIC METABOLIC PANEL
Anion gap: 7 (ref 5–15)
BUN: 22 mg/dL (ref 8–23)
CO2: 25 mmol/L (ref 22–32)
Calcium: 8.6 mg/dL — ABNORMAL LOW (ref 8.9–10.3)
Chloride: 105 mmol/L (ref 98–111)
Creatinine, Ser: 1.74 mg/dL — ABNORMAL HIGH (ref 0.61–1.24)
GFR calc Af Amer: 41 mL/min — ABNORMAL LOW (ref >60)
GFR calc non Af Amer: 35 mL/min — ABNORMAL LOW (ref >60)
Glucose, Bld: 98 mg/dL (ref 70–99)
Potassium: 4.4 mmol/L (ref 3.5–5.1)
Sodium: 137 mmol/L (ref 135–145)

## 2019-02-03 LAB — CBC
HCT: 33.6 % — ABNORMAL LOW (ref 39.0–52.0)
Hemoglobin: 10.3 g/dL — ABNORMAL LOW (ref 13.0–17.0)
MCH: 29.1 pg (ref 26.0–34.0)
MCHC: 30.7 g/dL (ref 30.0–36.0)
MCV: 94.9 fL (ref 80.0–100.0)
Platelets: 159 10*3/uL (ref 150–400)
RBC: 3.54 MIL/uL — ABNORMAL LOW (ref 4.22–5.81)
RDW: 15.4 % (ref 11.5–15.5)
WBC: 5 10*3/uL (ref 4.0–10.5)
nRBC: 0 % (ref 0.0–0.2)

## 2019-02-03 LAB — MAGNESIUM: Magnesium: 1.9 mg/dL (ref 1.7–2.4)

## 2019-02-03 LAB — HEPATIC FUNCTION PANEL
ALT: 21 U/L (ref 0–44)
AST: 27 U/L (ref 15–41)
Albumin: 3 g/dL — ABNORMAL LOW (ref 3.5–5.0)
Alkaline Phosphatase: 111 U/L (ref 38–126)
Bilirubin, Direct: 0.4 mg/dL — ABNORMAL HIGH (ref 0.0–0.2)
Indirect Bilirubin: 0.8 mg/dL (ref 0.3–0.9)
Total Bilirubin: 1.2 mg/dL (ref 0.3–1.2)
Total Protein: 7 g/dL (ref 6.5–8.1)

## 2019-02-03 LAB — TROPONIN I: Troponin I: <0.03 ng/mL (ref <0.03)

## 2019-02-03 LAB — LACTIC ACID, PLASMA: Lactic Acid, Venous: 1.5 mmol/L (ref 0.5–1.9)

## 2019-02-03 LAB — LIPASE, BLOOD: Lipase: 30 U/L (ref 11–51)

## 2019-02-03 NOTE — ED Provider Notes (Signed)
[facility] DEPARTMENT Provider Note   CSN: [id number] Arrival date & time: 02/03/19  1907    History   Chief Complaint Chief Complaint  Patient presents with  . Weakness    HPI [name] [name] is a 85 y.o. male.     The history is provided by the patient, a relative, medical records and the EMS personnel.  Weakness  Severity:  Moderate Onset quality:  Gradual Duration:  3 weeks Timing:  Constant Progression:  Worsening Chronicity:  Recurrent Context: change in medication, decreased sleep and stress   Context: not alcohol use, not dehydration, not increased activity, not recent infection and not urinary tract infection   Relieved by:  Nothing Worsened by:  Activity, standing and stress Ineffective treatments:  Rest, sleep and lying down Associated symptoms: diarrhea, difficulty walking and myalgias   Associated symptoms: no abdominal pain, no chest pain, no cough, no numbness in extremities, no fever, no loss of consciousness, no nausea, no seizures, no shortness of breath and no vomiting   Risk factors: new medications and recent stressors     Past Medical History:  Diagnosis Date  . Anxiety   . Aortic valve insufficiency   . Arthritis    "wee bit; knees, elbows" (10/15/2014)  . Atrial fibrillation ([place])   . Cardiomyopathy ([place])   . Chronic kidney disease    "down to ~ 1/2 of their regular use; I see kidney dr. in [place]" (10/15/2014)  . Complication of anesthesia    unsure of complications but pt. states there were complications  . COPD (chronic obstructive pulmonary disease) ([place])    on home o2  . Coronary artery disease   . Decreased testosterone level   . Depression   . Difficult intubation    difficult intubation 12/03/09 and 03/27/10 ([facility]); glidescope used 03/27/10  . DVT (deep venous thrombosis) ([place]) ~ 2013   "BLE"  . Dysrhythmia    A. Fib  . Emphysema of lung ([place])   . Enlarged prostate   . Falls frequently    > 20 times  in the last year/notes 10/15/2014  . GERD (gastroesophageal reflux disease)   . Heart murmur   . History of blood transfusion 1960   "lots; related to an accident"  . History of echocardiogram    post AVR >> Echo 7/16:  Mild LVH, EF 20-25%, AVR ok (peak 15 mmHg, mean 9 mmHg), MAC, mild MR, severe LAE, mild reduced RVSF, mild RAE, PASP 35 mmHg  . Hypercholesteremia   . Hypertension   . Hypothyroidism   . OSA (obstructive sleep apnea)    "suppose to wear mask; I throw it off in my sleep" (10/15/2014)  . Plantar fasciitis   . Right fibular fracture 10/15/2014  . Seasonal allergies   . Shortness of breath dyspnea   . Syncope 10/15/2014  . Syncope and collapse "several times"  . Urine frequency   . Urine incontinence   . Varicose veins     Patient Active Problem List   Diagnosis Date Noted  . Dementia with behavioral disturbance ([name] [name]) 05/13/2018  . History of DVT (deep vein thrombosis) 05/13/2018  . Bradycardia   . Permanent atrial fibrillation   . S/P AVR (aortic valve replacement)   . Altered mental status 04/26/2018  . Lower urinary tract infectious disease 04/26/2018  . Anemia 04/26/2018  . CKD (chronic kidney disease) stage 3, GFR 30-59 ml/min (HCC) 01/29/2018  . Symptomatic hypotension 01/27/2018  . COPD with acute exacerbation ([place]) 01/27/2018  .  Pre-diabetes 01/07/2017  . COPD (chronic obstructive pulmonary disease) with chronic bronchitis ([place]) 12/08/2016  . Elevated hemidiaphragm 12/08/2016  . Enlarged thoracic aorta ([place]) 12/03/2016  . Chronic diastolic heart failure ([place]) 12/03/2016  . Pulmonary nodule 12/03/2016  . Orthostatic hypotension 11/08/2015  . S/P AVR 02/11/2015  . Ataxia 12/05/2014  . Abnormality of gait 12/05/2014  . Neck pain, chronic 12/05/2014  . Abnormal carotid duplex scan 10/28/2014  . Obstructive sleep apnea 10/28/2014  . Mood disorder with psychosis ([place]) 10/28/2014  . CAD (coronary artery disease), native coronary artery 09/29/2014  . Falls  frequently 09/29/2014  . Essential hypertension   . Hyperlipidemia, unspecified   . AR (aortic regurgitation)   . Depression   . Paroxysmal A-fib ([place]) 06/20/2014  . Hypothyroidism 06/20/2014  . Exertional dyspnea 04/29/2014  . Obesity (BMI 30-39.9) 08/08/2012  . Anxiety 08/03/2012  . Hydrocele 07/14/2012  . Abdominal aortic aneurysm ([place]) 09/08/2011  . BPH (benign prostatic hyperplasia) 09/08/2011  . GERD (gastroesophageal reflux disease) 09/08/2011    Past Surgical History:  Procedure Laterality Date  . ABDOMINAL AORTIC ANEURYSM REPAIR  01/2006; 03/10/2006   Archie Endo 01/12/2011  . AORTIC VALVE REPLACEMENT N/A 02/11/2015   Procedure: AORTIC VALVE REPLACEMENT (AVR);  Surgeon: Grace Isaac, MD;  Location: [facility];  Service: Open Heart Surgery;  Laterality: N/A;  . BRAIN SURGERY  1960 X 4   "S/P got my skull busted"; pt. states removed internal carotid artery  . CARDIAC CATHETERIZATION  1990's X 1; 09/2014   no stents  . CLIPPING OF ATRIAL APPENDAGE N/A 02/11/2015   Procedure: CLIPPING OF ATRIAL APPENDAGE;  Surgeon: Grace Isaac, MD;  Location: [facility];  Service: Open Heart Surgery;  Laterality: N/A;  . ERCP  11/2009   Archie Endo 12/05/2009  . gall stone    . HERNIA REPAIR    . LAPAROSCOPIC CHOLECYSTECTOMY  08/2009   w/IOC/notes 09/18/2009  . LAPAROSCOPIC INCISIONAL / UMBILICAL / VENTRAL HERNIA REPAIR  02/2010   VHR w/mesh/notes 03/28/2010  . NOSE SURGERY    . TEE WITHOUT CARDIOVERSION N/A 02/11/2015   Procedure: TRANSESOPHAGEAL ECHOCARDIOGRAM (TEE);  Surgeon: Grace Isaac, MD;  Location: [facility];  Service: Open Heart Surgery;  Laterality: N/A;  . TONSILLECTOMY          Home Medications    Prior to Admission medications   Medication Sig Start Date End Date Taking? Authorizing Provider  apixaban (ELIQUIS) 2.5 MG TABS tablet Take 1 tablet (2.5 mg total) by mouth 2 (two) times daily. 02/21/18   Nahser, [name], MD  carvedilol (COREG) 25 MG tablet Take 1 tablet (25 mg total) by mouth  2 (two) times daily. 01/18/19   Lauree Chandler, NP  dutasteride (AVODART) 0.5 MG capsule TAKE 1 CAPSULE BY MOUTH EVERY DAY 01/31/19   Lauree Chandler, NP  furosemide (LASIX) 20 MG tablet TAKE 1 TABLET BY MOUTH DAILY FOR WEIGHT GAIN 3 POUNDS IN 24 HOURS 06/29/18   Lauree Chandler, NP  ipratropium-albuterol (DUONEB) 0.5-2.5 (3) MG/3ML SOLN USE 1 VIAL IN NEBULIZER EVERY 4 HOURS AS NEEDED 06/29/18   Lauree Chandler, NP  KLOR-CON M10 10 MEQ tablet TAKE 1 TABLET BY MOUTH EVERY DAY 07/03/18   Lauree Chandler, NP  levothyroxine (SYNTHROID) 175 MCG tablet TAKE 1 TABLET (175 MCG TOTAL) BY MOUTH DAILY BEFORE BREAKFAST. 01/10/19   Lauree Chandler, NP  memantine (NAMENDA XR) 28 MG CP24 24 hr capsule TAKE 1 CAPSULE (28 MG TOTAL) BY MOUTH DAILY. 10/31/18  Lauree Chandler, NP  Tiotropium Bromide Monohydrate (SPIRIVA RESPIMAT) 2.5 MCG/ACT AERS 2 puffs daily 11/27/18   Lauree Chandler, NP    Family History Family History  Problem Relation Age of Onset  . Rheum arthritis Mother   . Diabetes Mother   . Heart disease Father   . Rheum arthritis Father   . Kidney disease Father   . Other Sister        Murdered  . Breast cancer Sister   . Colon cancer Neg Hx   . Colon polyps Neg Hx   . Liver disease Neg Hx     Social History Social History   Tobacco Use  . Smoking status: Former Smoker    Packs/day: 2.00    Years: 22.00    Pack years: 44.00    Types: Cigarettes    Last attempt to quit: 04/29/1974    Years since quitting: 44.7  . Smokeless tobacco: Never Used  Substance Use Topics  . Alcohol use: Not Currently    Alcohol/week: 0.0 standard drinks    Comment: 1 beer per year   . Drug use: No     Allergies   Haldol [haloperidol lactate]   Review of Systems Review of Systems  Constitutional: Negative for fever.  Respiratory: Negative for cough and shortness of breath.   Cardiovascular: Negative for chest pain.  Gastrointestinal: Positive for diarrhea. Negative for  abdominal pain, nausea and vomiting.  Musculoskeletal: Positive for myalgias.  Neurological: Positive for weakness. Negative for seizures and loss of consciousness.  All other systems reviewed and are negative.    Physical Exam Updated Vital Signs BP 112/76   Pulse 100   Temp 97.8 F (36.6 C) (Axillary)   Resp (!) 28   Ht 6' (1.829 m)   Wt 108.9 kg   SpO2 97%   BMI 32.55 kg/m   Physical Exam Vitals signs and nursing note reviewed.  Constitutional:      Appearance: He is well-developed.  HENT:     Head: Normocephalic and atraumatic.     Mouth/Throat:     Mouth: Mucous membranes are moist.     Comments: Uvula midline  No obvious asymmetric palatine elevation  No obvious swellings posterior oropharynx  No obvious swellings appreciated on neck exam  Patient able to move neck in all directions without difficulty, tolerating secretions appropriately  Eyes:     Extraocular Movements: Extraocular movements intact.     Conjunctiva/sclera: Conjunctivae normal.     Pupils: Pupils are equal, round, and reactive to light.  Neck:     Musculoskeletal: Neck supple. No muscular tenderness.  Cardiovascular:     Rate and Rhythm: Normal rate.  Pulmonary:     Effort: Pulmonary effort is normal. No respiratory distress.     Breath sounds: Normal breath sounds. No stridor. No wheezing or rhonchi.  Abdominal:     Palpations: Abdomen is soft.     Tenderness: There is no abdominal tenderness.  Musculoskeletal: Normal range of motion.     Right lower leg: No edema.     Left lower leg: No edema.  Skin:    General: Skin is warm and dry.     Capillary Refill: Capillary refill takes less than 2 seconds.  Neurological:     General: No focal deficit present.     Mental Status: He is alert.      ED Treatments / Results  Labs (all labs ordered are listed, but only abnormal results are displayed) Labs Reviewed  URINALYSIS, ROUTINE W REFLEX MICROSCOPIC - Abnormal; Notable for the  following components:      Result Value   APPearance HAZY (*)    Bilirubin Urine MODERATE (*)    Ketones, ur 15 (*)    Protein, ur 30 (*)    Leukocytes,Ua SMALL (*)    All other components within normal limits  HEPATIC FUNCTION PANEL - Abnormal; Notable for the following components:   Albumin 3.0 (*)    Bilirubin, Direct 0.4 (*)    All other components within normal limits  BASIC METABOLIC PANEL - Abnormal; Notable for the following components:   Creatinine, Ser 1.74 (*)    Calcium 8.6 (*)    GFR calc non Af Amer 35 (*)    GFR calc Af Amer 41 (*)    All other components within normal limits  CBC - Abnormal; Notable for the following components:   RBC 3.54 (*)    Hemoglobin 10.3 (*)    HCT 33.6 (*)    All other components within normal limits  URINALYSIS, MICROSCOPIC (REFLEX) - Abnormal; Notable for the following components:   Bacteria, UA FEW (*)    All other components within normal limits  SARS CORONAVIRUS 2 (HOSPITAL ORDER, [facility])  LIPASE, BLOOD  MAGNESIUM  TROPONIN I  LACTIC ACID, PLASMA  BRAIN NATRIURETIC PEPTIDE    EKG EKG Interpretation  Date/Time:  Saturday February 03 2019 19:15:34 EDT Ventricular Rate:  97 PR Interval:    QRS Duration: 147 QT Interval:  409 QTC Calculation: 520 R Axis:   33 Text Interpretation:  Atrial fibrillation Left bundle branch block Confirmed by Elnora Morrison [phone]) on 02/03/2019 7:32:56 PM   Radiology Dg Chest Portable 1 View  Result Date: 02/03/2019 CLINICAL DATA:  Poor historian.  Weakness. EXAM: PORTABLE CHEST 1 VIEW COMPARISON:  09/04/2018 and chest CT 12/13/2018 FINDINGS: Sternotomy wires unchanged. Lungs are adequately inflated with stable elevation of the right hemidiaphragm. No evidence of focal airspace consolidation or effusion. Mild stable cardiomegaly. Remainder of the exam is unchanged. IMPRESSION: No acute cardiopulmonary disease. Stable cardiomegaly and stable elevation of the right  hemidiaphragm. Electronically Signed   By: Marin Olp M.D.   On: 02/03/2019 19:56    Procedures Procedures (including critical care time)  Medications Ordered in ED Medications - No data to display   Initial Impression / Assessment and Plan / ED Course  I have reviewed the triage vital signs and the nursing notes.  Pertinent labs & imaging results that were available during my care of the patient were reviewed by me and considered in my medical decision making (see chart for details).         Medical Decision Making: Joanne Brander is a 85 y.o. male who presented to the ED today with generalized weakness, inability to perform activities of daily living at home.  Reviewed and confirmed nursing documentation for past medical history, family history, social history.  On my initial exam, the pt was calm, alert and conversant, afebrile, heart rate 90 bpm, not hypotensive, no increased work of breathing or respiratory distress.  Lives at home with wife, wife taken to [facility] long for medical care today, will be admitted, son, Quaron Delacruz, [phone], called EMS because his father was unable to get up to go the bathroom, unable to get up to ambulate to get food, cannot take his medications without assistance.  EMS brought patient in for further evaluation and care. When asking patient if he is able  to care for himself at home he says no that he does not feel safe at home alone without his wife, believes he would probably need to be in a facility.  No emergent abnormalities on imaging, blood work, urine studies.  Patient feels generally weak otherwise grossly asymptomatic.  No focal deficits on physical exam. All radiology and laboratory studies reviewed independently and with my attending physician, agree with reading provided by radiologist unless otherwise noted.  Upon reassessing patient, patient was calm, resting comfortably, no new complaints.  Will have case management, social work  evaluate and work with patient for options as far as placement for safe disposition. The above care was discussed with and agreed upon by my attending physician.  Emergency Department Medication Summary:  Medications - No data to display  Final Clinical Impressions(s) / ED Diagnoses   Final diagnoses:  None    ED Discharge Orders    None       Lonzo Candy, MD 02/03/19 1007    Elnora Morrison, MD 02/04/19 [phone]

## 2019-02-03 NOTE — ED Triage Notes (Signed)
BIB [facility] from home with c/o of generalized weakness with some diarrhea X 3 weeks. Per EMS pt and his wife are not capable of taking care of one another. Wife taken to [facility] for psychological break tonight. Pt with hx of alzheimer's. Son does not live local but comes to help occasionally. Per EMS both pt and wife with decreased input due to mental and physical conditions and both not taking medications.

## 2019-02-04 LAB — BRAIN NATRIURETIC PEPTIDE: B Natriuretic Peptide: 244.6 pg/mL — ABNORMAL HIGH (ref 0.0–100.0)

## 2019-02-04 LAB — SARS CORONAVIRUS 2 BY RT PCR (HOSPITAL ORDER, PERFORMED IN ~~LOC~~ HOSPITAL LAB): SARS Coronavirus 2: NEGATIVE

## 2019-02-04 MED ORDER — APIXABAN 2.5 MG PO TABS
2.5000 mg | ORAL_TABLET | Freq: Two times a day (BID) | ORAL | Status: DC
  Administered 2019-02-04 – 2019-02-06 (×5): 2.5 mg via ORAL
  Filled 2019-02-04 (×8): qty 1

## 2019-02-04 MED ORDER — ACETAMINOPHEN 325 MG PO TABS: 650.0000 mg | ORAL_TABLET | Freq: Once | ORAL | Status: DC

## 2019-02-04 MED ORDER — POTASSIUM CHLORIDE CRYS ER 20 MEQ PO TBCR
10.0000 meq | EXTENDED_RELEASE_TABLET | Freq: Every day | ORAL | Status: DC
  Administered 2019-02-04 – 2019-02-06 (×3): 10 meq via ORAL
  Filled 2019-02-04 (×3): qty 1

## 2019-02-04 MED ORDER — LEVOTHYROXINE SODIUM 75 MCG PO TABS
175.0000 ug | ORAL_TABLET | Freq: Every day | ORAL | Status: DC
  Administered 2019-02-04 – 2019-02-06 (×3): 175 ug via ORAL
  Filled 2019-02-04 (×3): qty 1

## 2019-02-04 MED ORDER — CARVEDILOL 12.5 MG PO TABS
25.0000 mg | ORAL_TABLET | Freq: Two times a day (BID) | ORAL | Status: DC
  Administered 2019-02-04 – 2019-02-06 (×4): 25 mg via ORAL
  Filled 2019-02-04 (×5): qty 2

## 2019-02-04 MED ORDER — DUTASTERIDE 0.5 MG PO CAPS
0.5000 mg | ORAL_CAPSULE | Freq: Every day | ORAL | Status: DC
  Administered 2019-02-04 – 2019-02-06 (×3): 0.5 mg via ORAL
  Filled 2019-02-04 (×4): qty 1

## 2019-02-04 MED ORDER — MEMANTINE HCL ER 28 MG PO CP24
28.0000 mg | ORAL_CAPSULE | Freq: Every day | ORAL | Status: DC
  Administered 2019-02-04 – 2019-02-06 (×3): 28 mg via ORAL
  Filled 2019-02-04 (×4): qty 1

## 2019-02-04 NOTE — ED Notes (Signed)
Pt eating breakfast 

## 2019-02-04 NOTE — ED Notes (Signed)
Lunch tray ordered 

## 2019-02-04 NOTE — TOC Initial Note (Signed)
Transition of Care [facility]) - Initial/Assessment Note    Patient Details  Name: [name] [name] MRN: [id number] Date of Birth: March 22, 1932  Transition of Care [facility]) CM/SW Contact:    Oretha Milch, LCSW Phone Number: 02/04/2019, 2:20 PM  Clinical Narrative:  CSW met with patient and spoke with patient's son regarding patient being hospitalized. Per collateral and patient report patient was living in a single-family home with his spouse that was hospitalized on 02/03/2019. Patient and spouse were co-care givers for each other and per son's report his father began declining in the past year and was unable to provide much care for himself or his spouse. His spouse began to decline 6 months ago and was hospitalized. Patient was clear he would want to be home but can not take care of himself and is worried about his ability to take care of himself independently. CSW spoke with RN who stated patient needed assistance for ambulation. CSW requested a physical therapy evaluation.  CSW noted patient's son's concerns regarding possible long-term acute care. CSW provided him information and answered questions regarding SNF. CSW noted patient only provided consent for SNF level of care at this time. CSW will refer to SNF facilities at this time as it seems appropriate due to patient's limited physical ability. However, CSW is awaiting results of PT evaluation to determine appropriate level of care and will send recommendations as they are established.             Expected Discharge Plan: Skilled Nursing Facility Barriers to Discharge: Continued Medical Work up, Ship broker   Patient Goals and CMS Choice Patient states their goals for this hospitalization and ongoing recovery are:: "I want to be able to be taken care of or take care of myself or my wife." CMS Medicare.gov Compare Post Acute Care list provided to:: Patient Choice offered to / list presented to : Patient  Expected Discharge Plan and  Services Expected Discharge Plan: [place] arrangements for the past 2 months: Single Family Home                                      Prior Living Arrangements/Services Living arrangements for the past 2 months: Single Family Home Lives with:: Spouse(Spouse hospitalized) Patient language and need for interpreter reviewed:: No Do you feel safe going back to the place where you live?: No   "My wife is in the hospital. We take care of each other. I can't take care of myself at home."  Need for Family Participation in Patient Care: No (Comment) Care giver support system in place?: No (comment)   Criminal Activity/Legal Involvement Pertinent to Current Situation/Hospitalization: No - Comment as needed  Activities of Daily Living      Permission Sought/Granted Permission sought to share information with : Facility Sport and exercise psychologist, Family Supports Permission granted to share information with : Yes, Verbal Permission Granted  Share Information with NAME: Frenchie Dangerfield  Permission granted to share info w AGENCY: Facility contacts for referral  Permission granted to share info w Relationship: Son     Emotional Assessment Appearance:: Appears stated age Attitude/Demeanor/Rapport: Engaged, Self-Confident, Charismatic Affect (typically observed): Accepting, Adaptable, Appropriate Orientation: : Oriented to Place, Oriented to Situation, Oriented to  Time, Oriented to Self Alcohol / Substance Use: Not Applicable Psych Involvement: No (comment)  Admission diagnosis:  weakness  Patient Active Problem List  Diagnosis Date Noted  . Dementia with behavioral disturbance ([place]) 05/13/2018  . History of DVT (deep vein thrombosis) 05/13/2018  . Bradycardia   . Permanent atrial fibrillation   . S/P AVR (aortic valve replacement)   . Altered mental status 04/26/2018  . Lower urinary tract infectious disease 04/26/2018  . Anemia 04/26/2018  . CKD  (chronic kidney disease) stage 3, GFR 30-59 ml/min (HCC) 01/29/2018  . Symptomatic hypotension 01/27/2018  . COPD with acute exacerbation ([place]) 01/27/2018  . Pre-diabetes 01/07/2017  . COPD (chronic obstructive pulmonary disease) with chronic bronchitis ([place]) 12/08/2016  . Elevated hemidiaphragm 12/08/2016  . Enlarged thoracic aorta ([place]) 12/03/2016  . Chronic diastolic heart failure ([place]) 12/03/2016  . Pulmonary nodule 12/03/2016  . Orthostatic hypotension 11/08/2015  . S/P AVR 02/11/2015  . Ataxia 12/05/2014  . Abnormality of gait 12/05/2014  . Neck pain, chronic 12/05/2014  . Abnormal carotid duplex scan 10/28/2014  . Obstructive sleep apnea 10/28/2014  . Mood disorder with psychosis ([place]) 10/28/2014  . CAD (coronary artery disease), native coronary artery 09/29/2014  . Falls frequently 09/29/2014  . Essential hypertension   . Hyperlipidemia, unspecified   . AR (aortic regurgitation)   . Depression   . Paroxysmal A-fib ([place]) 06/20/2014  . Hypothyroidism 06/20/2014  . Exertional dyspnea 04/29/2014  . Obesity (BMI 30-39.9) 08/08/2012  . Anxiety 08/03/2012  . Hydrocele 07/14/2012  . Abdominal aortic aneurysm ([place]) 09/08/2011  . BPH (benign prostatic hyperplasia) 09/08/2011  . GERD (gastroesophageal reflux disease) 09/08/2011   PCP:  Lauree Chandler, NP Pharmacy:   [id number] [place], [place] [address]Phone: [phone]: [address] [address]Phone: [phone]: [phone]    Social Determinants of Health (SDOH) Interventions    Readmission Risk Interventions No flowsheet data found.

## 2019-02-04 NOTE — ED Notes (Signed)
Called PT and was directed to a pager number. This tech called [phone] and paged them to [phone].

## 2019-02-04 NOTE — ED Notes (Signed)
Patient denies pain and is resting comfortably.  

## 2019-02-04 NOTE — ED Notes (Signed)
Pt awake. Given urinal, graham crackers, and diet coke. Pt has no complaints at this time. Gave remote so pt can watch TV.

## 2019-02-04 NOTE — ED Notes (Signed)
Pt sleeping. 

## 2019-02-04 NOTE — ED Notes (Signed)
Called case management for follow up.

## 2019-02-04 NOTE — ED Notes (Signed)
Pt moved from other side of ED to purple zone #48. Pt sleeping at this time. Denies any pain. Call bell at bedside.

## 2019-02-04 NOTE — ED Notes (Signed)
No return phone call from PT at this time attempting to repage [phone]. [place] RN aware

## 2019-02-04 NOTE — Care Management (Signed)
ED CM received call from [place] concerning patient, CM and CSW unaware of consult orders were signed off.  ED CM will review patient's record, and notify ED CSW.

## 2019-02-04 NOTE — ED Notes (Signed)
Breakfast tray ordered 

## 2019-02-04 NOTE — Progress Notes (Signed)
Initial SNF referral faxed out to [facility] area SNF's and [facility] per patient request. CSW is awaiting PT evaluation to provide complete referral to providers.  Lamonte Richer, LCSW, [place] Worker II [phone]

## 2019-02-04 NOTE — ED Notes (Signed)
Son called and notified we are awaiting update from social work.

## 2019-02-04 NOTE — ED Notes (Signed)
Patient eating lunch.

## 2019-02-04 NOTE — ED Notes (Signed)
Per Dr. Dondra Spry Pt can have 25mg  of COREG.

## 2019-02-05 NOTE — Evaluation (Signed)
Physical Therapy Evaluation Patient Details Name: [name] [name] MRN: [id number] DOB: [birth year] Today's Date: 02/05/2019   History of Present Illness  Pt is an 85 y/o male presenting to the ED secondary to increased weakness and inability to care for himself at home. PMH includes COPD on home oxygen, CAD, CKD, a fib, dementia, and HTN.   Clinical Impression  Pt presenting with problem above and deficits below. Pt requiring max A to perform bed mobility and mod A to attempt standing. Pt very unsteady in standing this session. Feel pt will require increased assist at d/c as he is a very high fall risk. Will continue to follow acutely to maximize functional mobility independence and safety.     Follow Up Recommendations SNF;Supervision/Assistance - 24 hour    Equipment Recommendations  None recommended by PT    Recommendations for Other Services       Precautions / Restrictions Precautions Precautions: Fall Restrictions Weight Bearing Restrictions: No      Mobility  Bed Mobility Overal bed mobility: Needs Assistance Bed Mobility: Supine to Sit;Sit to Supine     Supine to sit: Max assist Sit to supine: Min assist   General bed mobility comments: Max A for LE assist and trunk elevation to come to sitting. Min A for LE assist for return to supine.   Transfers Overall transfer level: Needs assistance Equipment used: 1 person hand held assist Transfers: Sit to/from Stand Sit to Stand: Mod assist         General transfer comment: Mod A for lift assist and steadying assist. Pt unsteady in standing and unable to maintain standing for prolonged periods.   Ambulation/Gait                Stairs            Wheelchair Mobility    Modified Rankin (Stroke Patients Only)       Balance Overall balance assessment: Needs assistance Sitting-balance support: No upper extremity supported;Feet supported Sitting balance-Leahy Scale: Fair     Standing balance support:  Single extremity supported;During functional activity Standing balance-Leahy Scale: Poor Standing balance comment: reliant on UE and external support                              Pertinent Vitals/Pain Pain Assessment: No/denies pain    Home Living Family/patient expects to be discharged to:: Private residence Living Arrangements: Spouse/significant other Available Help at Discharge: Family Type of Home: House Home Access: Stairs to enter Entrance Stairs-Rails: Psychiatric nurse of Steps: 4 Home Layout: Two level;Able to live on main level with bedroom/bathroom Home Equipment: Gilford Rile - 2 wheels;Cane - single point Additional Comments: Per notes, wife is unable to care for pt and she is currently at [facility].     Prior Function Level of Independence: Independent         Comments: Reports he was independent with mobility, however, unsure of accuracy.      Hand Dominance        Extremity/Trunk Assessment   Upper Extremity Assessment Upper Extremity Assessment: Generalized weakness    Lower Extremity Assessment Lower Extremity Assessment: Generalized weakness    Cervical / Trunk Assessment Cervical / Trunk Assessment: Kyphotic  Communication   Communication: No difficulties  Cognition Arousal/Alertness: Awake/alert Behavior During Therapy: WFL for tasks assessed/performed Overall Cognitive Status: History of cognitive impairments - at baseline  General Comments: Per notes, pt with hx of dementia. Pt alert and oriented X2. Did not know why he was here and reporting it is July.       General Comments      Exercises     Assessment/Plan    PT Assessment Patient needs continued PT services  PT Problem List Decreased strength;Decreased balance;Decreased mobility;Decreased cognition;Decreased knowledge of use of DME;Decreased safety awareness;Decreased knowledge of precautions       PT Treatment  Interventions DME instruction;Gait training;Therapeutic activities;Functional mobility training;Therapeutic exercise;Balance training;Patient/family education;Cognitive remediation    PT Goals (Current goals can be found in the Care Plan section)  Acute Rehab PT Goals Patient Stated Goal: "to feel better"  PT Goal Formulation: With patient Time For Goal Achievement: 02/19/19 Potential to Achieve Goals: Good    Frequency Min 2X/week   Barriers to discharge Decreased caregiver support      Co-evaluation               AM-PAC PT "6 Clicks" Mobility  Outcome Measure Help needed turning from your back to your side while in a flat bed without using bedrails?: A Lot Help needed moving from lying on your back to sitting on the side of a flat bed without using bedrails?: Total Help needed moving to and from a bed to a chair (including a wheelchair)?: Total Help needed standing up from a chair using your arms (e.g., wheelchair or bedside chair)?: A Lot Help needed to walk in hospital room?: Total Help needed climbing 3-5 steps with a railing? : Total 6 Click Score: 8    End of Session Equipment Utilized During Treatment: Gait belt Activity Tolerance: Patient tolerated treatment well Patient left: in bed;with call bell/phone within reach Nurse Communication: Mobility status PT Visit Diagnosis: Unsteadiness on feet (R26.81);Muscle weakness (generalized) (M62.81);Difficulty in walking, not elsewhere classified (R26.2)    Time: [id number] PT Time Calculation (min) (ACUTE ONLY): 10 min   Charges:   PT Evaluation $PT Eval Moderate Complexity: 1 Mod          Gladys DammeBrittany Lonny Eisen, PT, DPT  Acute Rehabilitation Services  Pager: [phone] Office: [phone]   Lehman PromBrittany S Joshus Rogan 02/05/2019, 1:04 PM

## 2019-02-05 NOTE — ED Notes (Signed)
PT at bedside.

## 2019-02-05 NOTE — ED Notes (Signed)
Pharmacy notified to send 1000 medications

## 2019-02-05 NOTE — ED Notes (Signed)
RN repaged PT

## 2019-02-05 NOTE — ED Notes (Signed)
Urinal emptied. 264mls dark urine.

## 2019-02-05 NOTE — NC FL2 (Signed)
[place] MEDICAID FL2 LEVEL OF CARE SCREENING TOOL     IDENTIFICATION  Patient Name: [name] [name] Birthdate: 19-Jul-1932 Sex: male Admission Date (Current Location): 02/03/2019  [facility] [place] [place]   Facility and Address:  [facility] [place], [address], [place], [place] [address]      Provider Number: [id number]  Attending Physician Name and Address:  Default, Provider, MD  Relative Name and Phone Number:  Johnette AbrahamRob Phillips, [phone]    Current Level of Care: Hospital Recommended Level of Care: Skilled Nursing Facility Prior Approval Number:    Date Approved/Denied: 09/23/14 PASRR Number: [id number]  Discharge Plan: Home    Current Diagnoses: Patient Active Problem List   Diagnosis Date Noted  . Dementia with behavioral disturbance (HCC) 05/13/2018  . History of DVT (deep vein thrombosis) 05/13/2018  . Bradycardia   . Permanent atrial fibrillation   . S/P AVR (aortic valve replacement)   . Altered mental status 04/26/2018  . Lower urinary tract infectious disease 04/26/2018  . Anemia 04/26/2018  . CKD (chronic kidney disease) stage 3, GFR 30-59 ml/min (HCC) 01/29/2018  . Symptomatic hypotension 01/27/2018  . COPD with acute exacerbation (HCC) 01/27/2018  . Pre-diabetes 01/07/2017  . COPD (chronic obstructive pulmonary disease) with chronic bronchitis (HCC) 12/08/2016  . Elevated hemidiaphragm 12/08/2016  . Enlarged thoracic aorta (HCC) 12/03/2016  . Chronic diastolic heart failure (HCC) 12/03/2016  . Pulmonary nodule 12/03/2016  . Orthostatic hypotension 11/08/2015  . S/P AVR 02/11/2015  . Ataxia 12/05/2014  . Abnormality of gait 12/05/2014  . Neck pain, chronic 12/05/2014  . Abnormal carotid duplex scan 10/28/2014  . Obstructive sleep apnea 10/28/2014  . Mood disorder with psychosis (HCC) 10/28/2014  . CAD (coronary artery disease), native coronary artery 09/29/2014  . Falls frequently 09/29/2014  . Essential  hypertension   . Hyperlipidemia, unspecified   . AR (aortic regurgitation)   . Depression   . Paroxysmal A-fib (HCC) 06/20/2014  . Hypothyroidism 06/20/2014  . Exertional dyspnea 04/29/2014  . Obesity (BMI 30-39.9) 08/08/2012  . Anxiety 08/03/2012  . Hydrocele 07/14/2012  . Abdominal aortic aneurysm (HCC) 09/08/2011  . BPH (benign prostatic hyperplasia) 09/08/2011  . GERD (gastroesophageal reflux disease) 09/08/2011    Orientation RESPIRATION BLADDER Height & Weight     Self, Time, Situation, Place  O2(2L) Continent Weight: 240 lb (108.9 kg) Height:  6' (182.9 cm)  BEHAVIORAL SYMPTOMS/MOOD NEUROLOGICAL BOWEL NUTRITION STATUS  Dangerous to self, others or property  Oriented to self, place, situation - disoriented to time Continent  See discharge summary  AMBULATORY STATUS COMMUNICATION OF NEEDS Skin   Extensive Assist Verbally Normal                       Personal Care Assistance Level of Assistance  Bathing, Feeding, Dressing Bathing Assistance: Maximum assistance Feeding assistance: Limited assistance Dressing Assistance: Maximum assistance     Functional Limitations Info             SPECIAL CARE FACTORS FREQUENCY  PT (By licensed PT), OT (By licensed OT)     PT Frequency: 5x weekly OT Frequency: 5x weekly            Contractures Contractures Info: Not present    Additional Factors Info  Code Status, Allergies Code Status Info: Prior Allergies Info: Haldol           Current Medications (02/05/2019):  This is the current hospital active medication list Current Facility-Administered Medications  Medication  Dose Route Frequency Provider Last Rate Last Dose  . acetaminophen (TYLENOL) tablet 650 mg  650 mg Oral Once Mesner, Corene Cornea, MD   Stopped at 02/04/19 0630  . apixaban (ELIQUIS) tablet 2.5 mg  2.5 mg Oral BID Mesner, Corene Cornea, MD   2.5 mg at 02/05/19 1007  . carvedilol (COREG) tablet 25 mg  25 mg Oral BID Mesner, Corene Cornea, MD   Stopped at 02/05/19 0901   . dutasteride (AVODART) capsule 0.5 mg  0.5 mg Oral Daily Mesner, Jason, MD   0.5 mg at 02/04/19 1042  . levothyroxine (SYNTHROID) tablet 175 mcg  175 mcg Oral QAC breakfast Mesner, Corene Cornea, MD   175 mcg at 02/05/19 0755  . memantine (NAMENDA XR) 24 hr capsule 28 mg  28 mg Oral Daily Mesner, Jason, MD   28 mg at 02/05/19 1006  . potassium chloride SA (K-DUR) CR tablet 10 mEq  10 mEq Oral Daily Mesner, Jason, MD   10 mEq at 02/05/19 1006   Current Outpatient Medications  Medication Sig Dispense Refill  . apixaban (ELIQUIS) 2.5 MG TABS tablet Take 1 tablet (2.5 mg total) by mouth 2 (two) times daily. 180 tablet 1  . carvedilol (COREG) 25 MG tablet Take 1 tablet (25 mg total) by mouth 2 (two) times daily. 60 tablet 3  . dutasteride (AVODART) 0.5 MG capsule TAKE 1 CAPSULE BY MOUTH EVERY DAY (Patient taking differently: Take 0.5 mg by mouth daily. ) 90 capsule 1  . famotidine (PEPCID) 20 MG tablet Take 20 mg by mouth.    Marland Kitchen ipratropium-albuterol (DUONEB) 0.5-2.5 (3) MG/3ML SOLN USE 1 VIAL IN NEBULIZER EVERY 4 HOURS AS NEEDED (Patient taking differently: Take 3 mLs by nebulization every 6 (six) hours as needed (for SOB). ) 270 mL 1  . KLOR-CON M10 10 MEQ tablet TAKE 1 TABLET BY MOUTH EVERY DAY (Patient taking differently: Take 10 mEq by mouth once. ) 90 tablet 1  . levothyroxine (SYNTHROID) 175 MCG tablet TAKE 1 TABLET (175 MCG TOTAL) BY MOUTH DAILY BEFORE BREAKFAST. 30 tablet 0  . Melatonin 10 MG CAPS Take 10 mg by mouth daily as needed (for sleep).    . memantine (NAMENDA XR) 28 MG CP24 24 hr capsule TAKE 1 CAPSULE (28 MG TOTAL) BY MOUTH DAILY. 90 capsule 1  . Tiotropium Bromide Monohydrate (SPIRIVA RESPIMAT) 2.5 MCG/ACT AERS 2 puffs daily (Patient taking differently: Inhale 2 puffs into the lungs daily as needed (for wheezing). ) 4 g 5  . furosemide (LASIX) 20 MG tablet TAKE 1 TABLET BY MOUTH DAILY FOR WEIGHT GAIN 3 POUNDS IN 24 HOURS (Patient not taking: Reported on 02/04/2019) 30 tablet 3      Discharge Medications: Please see discharge summary for a list of discharge medications.  Relevant Imaging Results:  Relevant Lab Results:   Additional Information  SSN: [id number]  Archie Endo, LCSW

## 2019-02-05 NOTE — ED Notes (Signed)
PT confirmed pt on list and will be assessed later today, specific time not given.

## 2019-02-05 NOTE — TOC Progression Note (Signed)
Transition of Care [facility]) - Progression Note    Patient Details  Name: [name] [name] MRN: [id number] Date of Birth: [birth year]  [facility]) CM/SW [place], LCSW Phone Number: 02/05/2019, 3:13 PM  Clinical Narrative:     CSW spoke with patient's son Rob to discuss the plan for placement into a SNF. Rob states that his father was at [facility] earlier this year and that he desires his father to return there. Rob reports that he was on the way to pick up his mother from [facility] ED and that his parents can no longer care for one another safely at home. Rob reports that he will take his mother home and will talk to her about his father's SNF recommendation.  CSW spoke with Lexine Baton at [facility] who confirmed that the patient was accepted there and stated that she would begin the process for [facility] for placement. The patient can likely go to [facility] at the earliest.  CSW updated Rob with acceptance information into [facility], he is agreeable to placement there. CSW will continue following and will contact facility tomorrow to determine if authorization has been obtained.   Expected Discharge Plan: Skilled Nursing Facility Barriers to Discharge: Insurance Authorization  Expected Discharge Plan and Services Expected Discharge Plan: [place]       Living arrangements for the past 2 months: Single Family Home                         Social Determinants of Health (SDOH) Interventions    Readmission Risk Interventions No flowsheet data found.

## 2019-02-06 NOTE — TOC Transition Note (Signed)
Transition of Care [facility]) - CM/SW Discharge Note   Patient Details  Name: [name] [name] MRN: [id number] Date of Birth: 03/22/1932  [facility]) CM/SW Contact:  Archie Endo, LCSW Phone Number: 02/06/2019, 9:21 AM   Clinical Narrative:     CSW spoke with Lexine Baton at [facility] to confirm that insurance authorization was obtained and it was. Patient will transported to [facility] via [facility] at 12pm today. Patient will be going to room # 505. The number to call for report is [phone].  CSW informed patient's RN Jacqlyn Larsen of discharge plan, RN agreeable. Jacqlyn Larsen, RN will fax patient's AVS to [phone] whenever it is available.  Final next level of care: Skilled Nursing Facility Barriers to Discharge: No Barriers Identified   Patient Goals and CMS Choice Patient states their goals for this hospitalization and ongoing recovery are:: "Be able to care for myself and my wife" CMS Medicare.gov Compare Post Acute Care list provided to:: Other (Comment Required) Choice offered to / list presented to : Adult Children  Discharge Placement              Patient [facility] bed at: [facility] Patient to be transferred to facility by: [place] Name of family member notified: Nikolus Marczak Patient and family notified of of transfer: 02/06/19  Discharge Plan and Services     Social Determinants of Health (SDOH) Interventions     Readmission Risk Interventions No flowsheet data found.

## 2019-02-06 NOTE — ED Notes (Signed)
Breakfast tray ordered 

## 2019-02-06 NOTE — ED Notes (Signed)
Pt arrived to [facility] via stretcher. Pt woke briefly then returned to sleeping. Per SW, pt to [facility] today.

## 2019-02-06 NOTE — ED Notes (Signed)
144ml dark urine.

## 2019-02-06 NOTE — ED Notes (Signed)
[facility] being requested by Arbie Cookey, SW. Report called [facility]. All of pt's belongings at bedside.

## 2019-02-06 NOTE — ED Notes (Addendum)
[facility] has arrived to transport pt. Pt unable to sign signature pad. Pt wearing hospital gown. Pt verified he has all of his belongings.

## 2019-02-06 NOTE — Discharge Instructions (Signed)
Follow up as needed

## 2019-02-07 ENCOUNTER — Non-Acute Institutional Stay (SKILLED_NURSING_FACILITY): Payer: Medicare Other | Admitting: Internal Medicine

## 2019-02-07 ENCOUNTER — Encounter: Payer: Self-pay | Admitting: Internal Medicine

## 2019-02-07 DIAGNOSIS — R531 Weakness: Secondary | ICD-10-CM | POA: Diagnosis not present

## 2019-02-07 DIAGNOSIS — F02818 Dementia in other diseases classified elsewhere, unspecified severity, with other behavioral disturbance: Secondary | ICD-10-CM

## 2019-02-07 DIAGNOSIS — I480 Paroxysmal atrial fibrillation: Secondary | ICD-10-CM | POA: Diagnosis not present

## 2019-02-07 DIAGNOSIS — F0281 Dementia in other diseases classified elsewhere with behavioral disturbance: Secondary | ICD-10-CM

## 2019-02-07 DIAGNOSIS — G301 Alzheimer's disease with late onset: Secondary | ICD-10-CM | POA: Diagnosis not present

## 2019-02-07 DIAGNOSIS — Z86718 Personal history of other venous thrombosis and embolism: Secondary | ICD-10-CM

## 2019-02-07 DIAGNOSIS — E034 Atrophy of thyroid (acquired): Secondary | ICD-10-CM

## 2019-02-07 DIAGNOSIS — R3911 Hesitancy of micturition: Secondary | ICD-10-CM

## 2019-02-07 DIAGNOSIS — N401 Enlarged prostate with lower urinary tract symptoms: Secondary | ICD-10-CM

## 2019-02-07 NOTE — Progress Notes (Signed)
: Provider:  Inocencio Homes, MD Location:  [facility] Room Number: 326 P Place of Service:  SNF ([phone])  PCP: Lauree Chandler, NP Patient Care Team: Lauree Chandler, NP as PCP - General (Geriatric Medicine) Nahser, [name], MD as PCP - Cardiology (Cardiology) Nahser, [name], MD as Consulting Physician (Cardiology) [name], MD as Referring Physician (Urology)  Extended Emergency Contact Information Primary Emergency Contact: Mooney,[name] Address: [address]          [place], [place] [address] [name] Phone: [phone] Mobile Phone: [phone] Relation: Son Secondary Emergency Contact: Boonstra,[name] Address: [place], [place] Phone: [phone] Relation: Spouse     Allergies: Haldol [haloperidol lactate]  Chief Complaint  Patient presents with  . New Admit To SNF    Admission    HPI: Patient is 85 y.o. male who presented to [facility] ED with complaints of generalized weakness, and inability to perform activities of daily living at home for approximately the last 3 weeks..  We continue this has been recurrent.  Worsened with activity standing or stress, there may have been diarrhea associated myalgias but no abdominal pain no chest pain no cough no numbness in extremities no fever no loss of consciousness no nausea no seizures no shortness of breath no vomiting.  Per patient's son he does not feel like he is able to take care of the patient anymore at home.  It appears that even though patient had an H&P was not actually admitted to the hospital but was in [facility] emergency department from 6/6-8 at which time patient was admitted to skilled nursing facility for OT/PT, weakness and probable residential care.  While at skilled nursing facility patient will be followed for chronic atrial fibrillation treated with Eliquis and Coreg, hypothyroidism treated with  Synthroid and dementia treated with Namenda.  Past Medical History:  Diagnosis Date  . Anxiety   . Aortic valve insufficiency   . Arthritis    "wee bit; knees, elbows" (10/15/2014)  . Atrial fibrillation ([place])   . Cardiomyopathy ([place])   . Chronic kidney disease    "down to ~ 1/2 of their regular use; I see kidney dr. in [place]" (10/15/2014)  . Complication of anesthesia    unsure of complications but pt. states there were complications  . COPD (chronic obstructive pulmonary disease) ([place])    on home o2  . Coronary artery disease   . Decreased testosterone level   . Depression   . Difficult intubation    difficult intubation 12/03/09 and 03/27/10 ([facility]); glidescope used 03/27/10  . DVT (deep venous thrombosis) ([place]) ~ 2013   "BLE"  . Dysrhythmia    A. Fib  . Emphysema of lung ([place])   . Enlarged prostate   . Falls frequently    > 20 times in the last year/notes 10/15/2014  . GERD (gastroesophageal reflux disease)   . Heart murmur   . History of blood transfusion 1960   "lots; related to an accident"  . History of echocardiogram    post AVR >> Echo 7/16:  Mild LVH, EF 20-25%, AVR ok (peak 15 mmHg, mean 9 mmHg), MAC, mild MR, severe LAE, mild reduced RVSF, mild RAE, PASP 35 mmHg  . Hypercholesteremia   . Hypertension   . Hypothyroidism   . OSA (obstructive sleep apnea)    "suppose to wear mask; I throw  it off in my sleep" (10/15/2014)  . Plantar fasciitis   . Right fibular fracture 10/15/2014  . Seasonal allergies   . Shortness of breath dyspnea   . Syncope 10/15/2014  . Syncope and collapse "several times"  . Urine frequency   . Urine incontinence   . Varicose veins     Past Surgical History:  Procedure Laterality Date  . ABDOMINAL AORTIC ANEURYSM REPAIR  01/2006; 03/10/2006   Archie Endo 01/12/2011  . AORTIC VALVE REPLACEMENT N/A 02/11/2015   Procedure: AORTIC VALVE REPLACEMENT (AVR);  Surgeon: Grace Isaac, MD;  Location: [facility];  Service: Open Heart Surgery;  Laterality:  N/A;  . BRAIN SURGERY  1960 X 4   "S/P got my skull busted"; pt. states removed internal carotid artery  . CARDIAC CATHETERIZATION  1990's X 1; 09/2014   no stents  . CLIPPING OF ATRIAL APPENDAGE N/A 02/11/2015   Procedure: CLIPPING OF ATRIAL APPENDAGE;  Surgeon: Grace Isaac, MD;  Location: [facility];  Service: Open Heart Surgery;  Laterality: N/A;  . ERCP  11/2009   Archie Endo 12/05/2009  . gall stone    . HERNIA REPAIR    . LAPAROSCOPIC CHOLECYSTECTOMY  08/2009   w/IOC/notes 09/18/2009  . LAPAROSCOPIC INCISIONAL / UMBILICAL / VENTRAL HERNIA REPAIR  02/2010   VHR w/mesh/notes 03/28/2010  . NOSE SURGERY    . TEE WITHOUT CARDIOVERSION N/A 02/11/2015   Procedure: TRANSESOPHAGEAL ECHOCARDIOGRAM (TEE);  Surgeon: Grace Isaac, MD;  Location: [facility];  Service: Open Heart Surgery;  Laterality: N/A;  . TONSILLECTOMY      Allergies as of 02/07/2019      Reactions   Haldol [haloperidol Lactate]    Over-sedation and at times agitation       Medication List       Accurate as of February 07, 2019  8:31 AM. If you have any questions, ask your nurse or doctor.        STOP taking these medications   famotidine 20 MG tablet Commonly known as:  PEPCID Stopped by:  Inocencio Homes, MD   furosemide 20 MG tablet Commonly known as:  LASIX Stopped by:  Inocencio Homes, MD     TAKE these medications   apixaban 2.5 MG Tabs tablet Commonly known as:  Eliquis Take 1 tablet (2.5 mg total) by mouth 2 (two) times daily.   carvedilol 25 MG tablet Commonly known as:  COREG Take 1 tablet (25 mg total) by mouth 2 (two) times daily.   dutasteride 0.5 MG capsule Commonly known as:  AVODART Take 0.5 mg by mouth daily. What changed:  Another medication with the same name was removed. Continue taking this medication, and follow the directions you see here. Changed by:  Inocencio Homes, MD   ipratropium-albuterol 0.5-2.5 (3) MG/3ML Soln Commonly known as:  DUONEB Take 3 mLs by nebulization every 4 (four) hours  as needed. What changed:  Another medication with the same name was removed. Continue taking this medication, and follow the directions you see here. Changed by:  Inocencio Homes, MD   levothyroxine 175 MCG tablet Commonly known as:  SYNTHROID TAKE 1 TABLET (175 MCG TOTAL) BY MOUTH DAILY BEFORE BREAKFAST.   Melatonin 10 MG Caps Take 10 mg by mouth daily as needed (for sleep).   memantine 28 MG Cp24 24 hr capsule Commonly known as:  NAMENDA XR TAKE 1 CAPSULE (28 MG TOTAL) BY MOUTH DAILY.   NON FORMULARY Diet Type:  Regular NAS. Heart Healthy   potassium chloride  10 MEQ tablet Commonly known as:  K-DUR Take 10 mEq by mouth daily. What changed:  Another medication with the same name was removed. Continue taking this medication, and follow the directions you see here. Changed by:  Inocencio Homes, MD   Spiriva Respimat 2.5 MCG/ACT Aers Generic drug:  Tiotropium Bromide Monohydrate Inhale 2 puffs into the lungs daily. What changed:  Another medication with the same name was removed. Continue taking this medication, and follow the directions you see here. Changed by:  Inocencio Homes, MD       No orders of the defined types were placed in this encounter.   Immunization History  Administered Date(s) Administered  . Influenza, High Dose Seasonal PF 05/29/2013, 06/09/2016, 05/05/2018  . Influenza-Unspecified 04/30/2014, 08/15/2014  . Pneumococcal Conjugate-13 08/15/2014  . Pneumococcal Polysaccharide-23 04/29/2010    Social History   Tobacco Use  . Smoking status: Former Smoker    Packs/day: 2.00    Years: 22.00    Pack years: 44.00    Types: Cigarettes    Last attempt to quit: 04/29/1974    Years since quitting: 44.8  . Smokeless tobacco: Never Used  Substance Use Topics  . Alcohol use: Not Currently    Alcohol/week: 0.0 standard drinks    Comment: 1 beer per year     Family history is   Family History  Problem Relation Age of Onset  . Rheum arthritis Mother   .  Diabetes Mother   . Heart disease Father   . Rheum arthritis Father   . Kidney disease Father   . Other Sister        Murdered  . Breast cancer Sister   . Colon cancer Neg Hx   . Colon polyps Neg Hx   . Liver disease Neg Hx       Review of Systems  DATA OBTAINED: from patient, nurse GENERAL:  no fevers, +fatigue, appetite changes SKIN: No itching, or rash EYES: No eye pain, redness, discharge EARS: No earache, tinnitus, change in hearing NOSE: No congestion, drainage or bleeding  MOUTH/THROAT: No mouth or tooth pain, No sore throat RESPIRATORY: No cough, wheezing, SOB CARDIAC: No chest pain, palpitations, lower extremity edema  GI: No abdominal pain, No N/V/D or constipation, No heartburn or reflux  GU: No dysuria, frequency or urgency, or incontinence  MUSCULOSKELETAL: No unrelieved bone/joint pain NEUROLOGIC: No headache, dizziness or focal weakness PSYCHIATRIC: No c/o anxiety or sadness   Vitals:   02/07/19 0815  BP: 112/73  Pulse: 85  Resp: 18  Temp: (!) 97.3 F (36.3 C)    SpO2 Readings from Last 1 Encounters:  02/06/19 99%   Body mass index is 32.55 kg/m.     Physical Exam  GENERAL APPEARANCE: Alert, conversant,  No acute distress.  SKIN: No diaphoresis rash HEAD: Normocephalic, atraumatic  EYES: Conjunctiva/lids clear. Pupils round, reactive. EOMs intact.  EARS: External exam WNL, canals clear. Hearing grossly normal.  NOSE: No deformity or discharge.  MOUTH/THROAT: Lips w/o lesions  RESPIRATORY: Breathing is even, unlabored. Lung sounds are clear   CARDIOVASCULAR: Heart RRR no murmurs, rubs or gallops. No peripheral edema.   GASTROINTESTINAL: Abdomen is soft, non-tender, not distended w/ normal bowel sounds. GENITOURINARY: Bladder non tender, not distended  MUSCULOSKELETAL: No abnormal joints or musculature NEUROLOGIC:  Cranial nerves 2-12 grossly intact. Moves all extremities  PSYCHIATRIC: Mood and affect with dementia, no behavioral issues   Patient Active Problem List   Diagnosis Date Noted  . Dementia with behavioral disturbance ([place])  05/13/2018  . History of DVT (deep vein thrombosis) 05/13/2018  . Bradycardia   . Permanent atrial fibrillation   . S/P AVR (aortic valve replacement)   . Altered mental status 04/26/2018  . Lower urinary tract infectious disease 04/26/2018  . Anemia 04/26/2018  . CKD (chronic kidney disease) stage 3, GFR 30-59 ml/min (H[facility]) 01/29/2018  . Symptomatic hypotension 01/27/2018  . COPD with acute exacerbation ([place]) 01/27/2018  . Pre-diabetes 01/07/2017  . COPD (chronic obstructive pulmonary disease) with chronic bronchitis ([employer]) 12/08/2016  . Elevated hemidiaphragm 12/08/2016  . Enlarged thoracic aorta ([place]) 12/03/2016  . Chronic diastolic heart failure ([place]) 12/03/2016  . Pulmonary nodule 12/03/2016  . Orthostatic hypotension 11/08/2015  . S/P AVR 02/11/2015  . Ataxia 12/05/2014  . Abnormality of gait 12/05/2014  . Neck pain, chronic 12/05/2014  . Abnormal carotid duplex scan 10/28/2014  . Obstructive sleep apnea 10/28/2014  . Mood disorder with psychosis ([place]) 10/28/2014  . CAD (coronary artery disease), native coronary artery 09/29/2014  . Falls frequently 09/29/2014  . Essential hypertension   . Hyperlipidemia, unspecified   . AR (aortic regurgitation)   . Depression   . Paroxysmal A-fib ([place]) 06/20/2014  . Hypothyroidism 06/20/2014  . Exertional dyspnea 04/29/2014  . Obesity (BMI 30-39.9) 08/08/2012  . Anxiety 08/03/2012  . Hydrocele 07/14/2012  . Abdominal aortic aneurysm ([place]) 09/08/2011  . BPH (benign prostatic hyperplasia) 09/08/2011  . GERD (gastroesophageal reflux disease) 09/08/2011      Labs reviewed: Basic Metabolic Panel:    Component Value Date/Time   NA 137 02/03/2019 1940   NA 144 05/15/2018   K 4.4 02/03/2019 1940   CL 105 02/03/2019 1940   CO2 25 02/03/2019 1940   GLUCOSE 98 02/03/2019 1940   BUN 22 02/03/2019 1940   BUN 23 (A) 05/15/2018    CREATININE 1.74 (H) 02/03/2019 1940   CREATININE 1.69 (H) 11/14/2018 1404   CALCIUM 8.6 (L) 02/03/2019 1940   PROT 7.0 02/03/2019 1940   PROT 7.1 04/08/2015 1428   ALBUMIN 3.0 (L) 02/03/2019 1940   ALBUMIN 4.2 04/08/2015 1428   AST 27 02/03/2019 1940   ALT 21 02/03/2019 1940   ALKPHOS 111 02/03/2019 1940   BILITOT 1.2 02/03/2019 1940   BILITOT 0.2 04/08/2015 1428   GFRNONAA 35 (L) 02/03/2019 1940   GFRNONAA 36 (L) 11/14/2018 1404   GFRAA 41 (L) 02/03/2019 1940   GFRAA 42 (L) 11/14/2018 1404    Recent Labs    04/27/18 0416  05/01/18 0425 05/02/18 0432 05/03/18 0428  10/16/18 0831 11/14/18 1404 02/03/19 1940  NA 141   < > 137 138 138   < > 143 141 137  K 4.2   < > 3.9 4.1 4.2   < > 4.4 5.1 4.4  CL 100   < > 99 98 97*   < > 108 106 105  CO2 32   < > 28 32 32   < > _0 GLUCOSE 125*   < > 96 105* 114*   < > 104* 97 98  BUN 25*   < > _1 < > _2 CREATININE 2.34*   < > 1.86* 2.04* 1.88*   < > 1.51* 1.69* 1.74*  CALCIUM 9.0   < > 8.3* 8.4* 8.8*   < > 8.9 8.7 8.6*  MG 2.1  --   --   --  2.3  --   --   --  1.9  PHOS 2.8   < >  3.9 3.6 3.8  --   --   --   --    < > = values in this interval not displayed.   Liver Function Tests: Recent Labs    05/04/18 0333  09/04/18 0353  10/16/18 0831 11/14/18 1404 02/03/19 1940  AST 26   < > 149*   < > _0 ALT 18   < > 58*   < > 43 13 21  ALKPHOS 65  --  160*  --   --   --  111  BILITOT 0.8   < > 1.2   < > 1.0 0.6 1.2  PROT 6.3*   < > 6.3*   < > 6.5 6.5 7.0  ALBUMIN 3.4*  --  2.9*  --   --   --  3.0*   < > = values in this interval not displayed.   Recent Labs    09/04/18 0353 02/03/19 1940  LIPASE 32 30   No results for input(s): AMMONIA in the last 8760 hours. CBC: Recent Labs    04/27/18 0416  07/12/18 1002 09/04/18 0105 09/29/18 1059 02/03/19 1940  WBC 6.2   < > 5.4 6.8 4.4 5.0  NEUTROABS 3.4  --  3,737  --  2,768  --   HGB 12.4*   < > 9.8* 11.5* 11.7* 10.3*  HCT 39.4   < > 29.9* 38.8* 35.8*  33.6*  MCV 98.3   < > 94.0 94.9 88.2 94.9  PLT 186   < > 176 181 175 159   < > = values in this interval not displayed.   Lipid Recent Labs    07/12/18 1002 11/14/18 1404  CHOL 87 126  HDL 28* 28*  LDLCALC 41 75  TRIG 92 147    Cardiac Enzymes: Recent Labs    09/04/18 1313 09/04/18 1752 02/03/19 1940  TROPONINI <0.03 <0.03 <0.03   BNP: Recent Labs    02/03/19 1940  BNP 244.6*   No results found for: [facility] Lab Results  Component Value Date   HGBA1C 5.7 (H) 01/28/2018   Lab Results  Component Value Date   TSH 7.15 (H) 11/14/2018   Lab Results  Component Value Date   VITAMINB12 318 04/08/2015   Lab Results  Component Value Date   FOLATE >20.0 04/08/2015   No results found for: IRON, TIBC, FERRITIN  Imaging and Procedures obtained prior to SNF admission: Dg Chest Portable 1 View  Result Date: 02/03/2019 CLINICAL DATA:  Poor historian.  Weakness. EXAM: PORTABLE CHEST 1 VIEW COMPARISON:  09/04/2018 and chest CT 12/13/2018 FINDINGS: Sternotomy wires unchanged. Lungs are adequately inflated with stable elevation of the right hemidiaphragm. No evidence of focal airspace consolidation or effusion. Mild stable cardiomegaly. Remainder of the exam is unchanged. IMPRESSION: No acute cardiopulmonary disease. Stable cardiomegaly and stable elevation of the right hemidiaphragm. Electronically Signed   By: Marin Olp M.D.   On: 02/03/2019 19:56     Not all labs, radiology exams or other studies done during hospitalization come through on my EPIC note; however they are reviewed by me.    Assessment and Plan  Generalized weakness- no specific instigator to slow decline; scans labs and EKGs ruled out acute pathology SNF- admitted for OT/PT and for probable residential care  Dementia without behavioral problems- SNF-continue Namenda 24-hour capsule 28 mg daily  Chronic atrial fibrillation/history of DVT SNF- continue Coreg 25 mg twice daily and Eliquis 2.5 mg  daily  Hypothyroidism SNF- not stated  as uncontrolled; continue levothyroxine 175 mg daily  BPH SNF- continue Avodart 0.5 mg daily   Time spent greater than 45 minutes;> 50% of time with patient was spent reviewing records, labs, tests and studies, counseling and developing plan of care  Hennie Duos, MD

## 2019-02-10 ENCOUNTER — Encounter: Payer: Self-pay | Admitting: Internal Medicine

## 2019-02-10 DIAGNOSIS — R531 Weakness: Secondary | ICD-10-CM | POA: Insufficient documentation

## 2019-02-11 ENCOUNTER — Other Ambulatory Visit: Payer: Self-pay | Admitting: Nurse Practitioner

## 2019-02-14 ENCOUNTER — Other Ambulatory Visit: Payer: Self-pay | Admitting: Cardiovascular Disease

## 2019-02-14 NOTE — Telephone Encounter (Signed)
Prescription refill request for Eliquis received.  Last office visit: Dr. Acie Fredrickson ( 03-17-2018) Scr: 1.74 (02-03-2019) Age: 85 yrs old Weight: 118.1kg (11-14-2018)  Prescription refill sent.

## 2019-02-28 ENCOUNTER — Other Ambulatory Visit: Payer: Self-pay | Admitting: Nurse Practitioner

## 2019-02-28 ENCOUNTER — Non-Acute Institutional Stay (SKILLED_NURSING_FACILITY): Payer: Medicare Other | Admitting: Internal Medicine

## 2019-02-28 ENCOUNTER — Encounter: Payer: Self-pay | Admitting: Internal Medicine

## 2019-02-28 DIAGNOSIS — R531 Weakness: Secondary | ICD-10-CM | POA: Diagnosis not present

## 2019-02-28 DIAGNOSIS — E034 Atrophy of thyroid (acquired): Secondary | ICD-10-CM

## 2019-02-28 DIAGNOSIS — N401 Enlarged prostate with lower urinary tract symptoms: Secondary | ICD-10-CM

## 2019-02-28 DIAGNOSIS — G301 Alzheimer's disease with late onset: Secondary | ICD-10-CM | POA: Diagnosis not present

## 2019-02-28 DIAGNOSIS — R3911 Hesitancy of micturition: Secondary | ICD-10-CM

## 2019-02-28 DIAGNOSIS — I480 Paroxysmal atrial fibrillation: Secondary | ICD-10-CM | POA: Diagnosis not present

## 2019-02-28 DIAGNOSIS — F0281 Dementia in other diseases classified elsewhere with behavioral disturbance: Secondary | ICD-10-CM

## 2019-02-28 DIAGNOSIS — Z86718 Personal history of other venous thrombosis and embolism: Secondary | ICD-10-CM

## 2019-02-28 MED ORDER — APIXABAN 2.5 MG PO TABS: 2.5000 mg | ORAL_TABLET | Freq: Two times a day (BID) | ORAL | 0 refills | Status: DC

## 2019-02-28 MED ORDER — POTASSIUM CHLORIDE CRYS ER 10 MEQ PO TBCR: 10.0000 meq | EXTENDED_RELEASE_TABLET | Freq: Every day | ORAL | 0 refills | Status: DC

## 2019-02-28 MED ORDER — IPRATROPIUM-ALBUTEROL 0.5-2.5 (3) MG/3ML IN SOLN: 3.0000 mL | RESPIRATORY_TRACT | 0 refills | Status: AC | PRN

## 2019-02-28 MED ORDER — SPIRIVA RESPIMAT 2.5 MCG/ACT IN AERS: 2.0000 | INHALATION_SPRAY | Freq: Every day | RESPIRATORY_TRACT | 0 refills | Status: DC

## 2019-02-28 MED ORDER — DUTASTERIDE 0.5 MG PO CAPS: 0.5000 mg | ORAL_CAPSULE | Freq: Every day | ORAL | 0 refills | Status: DC

## 2019-02-28 MED ORDER — MEMANTINE HCL ER 28 MG PO CP24: 28.0000 mg | ORAL_CAPSULE | Freq: Every day | ORAL | 0 refills | Status: DC

## 2019-02-28 MED ORDER — CARVEDILOL 25 MG PO TABS: 25.0000 mg | ORAL_TABLET | Freq: Two times a day (BID) | ORAL | 0 refills | Status: DC

## 2019-02-28 MED ORDER — LEVOTHYROXINE SODIUM 175 MCG PO TABS: 175.0000 ug | ORAL_TABLET | Freq: Every day | ORAL | 0 refills | Status: DC

## 2019-02-28 NOTE — Progress Notes (Addendum)
Location:  [facility] Room Number: 505-P Place of Service:  SNF (31)  PCP: Lauree Chandler, NP Patient Care Team: Lauree Chandler, NP as PCP - General (Geriatric Medicine) Nahser, [name], MD as PCP - Cardiology (Cardiology) Nahser, [name], MD as Consulting Physician (Cardiology) [name], MD as Referring Physician (Urology)  Extended Emergency Contact Information Primary Emergency Contact: Bassette,[name] Address: [address]          [place], [place] [address] [name] of [place] Phone: [phone] Mobile Phone: [phone] Relation: Son Secondary Emergency Contact: Wetherington,[name] Address: [place], [place] Phone: [phone] Relation: Spouse  Allergies  Allergen Reactions  . Haldol [Haloperidol Lactate]     Over-sedation and at times agitation     Chief Complaint  Patient presents with  . Discharge Note    Patient to discharge home with family on 03/01/19    HPI:  85 y.o. male who presented to [facility] ED with complaints of generalized weakness, and inability to perform activities of daily living at home for approximately the prior 3 weeks.  This appears to have been recurrent.  Worsened with activity, standing, or stress and there is been diarrhea associated with myalgias but no abdominal pain, no chest pain no cough no numbness in extremities no fever, no loss of consciousness, no nausea, no seizures no shortness of breath, no vomiting.  Per patient's son he does does not feel like he is able to take care of him anymore.  It appears that patient had an H&P but was not actually admitted to the hospital but was in the emergency department from 6/6-8 at which time the patient was admitted to skilled nursing facility for OT/PT.  Patient is now ready to be discharged to home.    Past Medical History:  Diagnosis Date  . Anxiety   . Aortic valve insufficiency   .  Arthritis    "wee bit; knees, elbows" (10/15/2014)  . Atrial fibrillation ([place])   . Cardiomyopathy ([place])   . Chronic kidney disease    "down to ~ 1/2 of their regular use; I see kidney dr. in [place]" (10/15/2014)  . Complication of anesthesia    unsure of complications but pt. states there were complications  . COPD (chronic obstructive pulmonary disease) ([place])    on home o2  . Coronary artery disease   . Decreased testosterone level   . Depression   . Difficult intubation    difficult intubation 12/03/09 and 03/27/10 ([facility]); glidescope used 03/27/10  . DVT (deep venous thrombosis) ([place]) ~ 2013   "BLE"  . Dysrhythmia    A. Fib  . Emphysema of lung ([place])   . Enlarged prostate   . Falls frequently    > 20 times in the last year/notes 10/15/2014  . GERD (gastroesophageal reflux disease)   . Heart murmur   . History of blood transfusion 1960   "lots; related to an accident"  . History of echocardiogram    post AVR >> Echo 7/16:  Mild LVH, EF 20-25%, AVR ok (peak 15 mmHg, mean 9 mmHg), MAC, mild MR, severe LAE, mild reduced RVSF, mild RAE, PASP 35 mmHg  . Hypercholesteremia   . Hypertension   . Hypothyroidism   . OSA (obstructive sleep apnea)    "suppose to wear mask; I throw it off in my sleep" (10/15/2014)  . Plantar fasciitis   .  Right fibular fracture 10/15/2014  . Seasonal allergies   . Shortness of breath dyspnea   . Syncope 10/15/2014  . Syncope and collapse "several times"  . Urine frequency   . Urine incontinence   . Varicose veins     Past Surgical History:  Procedure Laterality Date  . ABDOMINAL AORTIC ANEURYSM REPAIR  01/2006; 03/10/2006   Archie Endo 01/12/2011  . AORTIC VALVE REPLACEMENT N/A 02/11/2015   Procedure: AORTIC VALVE REPLACEMENT (AVR);  Surgeon: Grace Isaac, MD;  Location: [facility];  Service: Open Heart Surgery;  Laterality: N/A;  . BRAIN SURGERY  1960 X 4   "S/P got my skull busted"; pt. states removed internal carotid artery  . CARDIAC CATHETERIZATION   1990's X 1; 09/2014   no stents  . CLIPPING OF ATRIAL APPENDAGE N/A 02/11/2015   Procedure: CLIPPING OF ATRIAL APPENDAGE;  Surgeon: Grace Isaac, MD;  Location: [facility];  Service: Open Heart Surgery;  Laterality: N/A;  . ERCP  11/2009   Archie Endo 12/05/2009  . gall stone    . HERNIA REPAIR    . LAPAROSCOPIC CHOLECYSTECTOMY  08/2009   w/IOC/notes 09/18/2009  . LAPAROSCOPIC INCISIONAL / UMBILICAL / VENTRAL HERNIA REPAIR  02/2010   VHR w/mesh/notes 03/28/2010  . NOSE SURGERY    . TEE WITHOUT CARDIOVERSION N/A 02/11/2015   Procedure: TRANSESOPHAGEAL ECHOCARDIOGRAM (TEE);  Surgeon: Grace Isaac, MD;  Location: [facility];  Service: Open Heart Surgery;  Laterality: N/A;  . TONSILLECTOMY       reports that he quit smoking about 44 years ago. His smoking use included cigarettes. He has a 44.00 pack-year smoking history. He has never used smokeless tobacco. He reports previous alcohol use. He reports that he does not use drugs. Social History   Socioeconomic History  . Marital status: Married    Spouse name: Joycelyn Schmid  . Number of children: 1  . Years of education: Master's  . Highest education level: Not on file  Occupational History  . Not on file  Social Needs  . Financial resource strain: Not on file  . Food insecurity    Worry: Not on file    Inability: Not on file  . Transportation needs    Medical: Not on file    Non-medical: Not on file  Tobacco Use  . Smoking status: Former Smoker    Packs/day: 2.00    Years: 22.00    Pack years: 44.00    Types: Cigarettes    Quit date: 04/29/1974    Years since quitting: 44.8  . Smokeless tobacco: Never Used  Substance and Sexual Activity  . Alcohol use: Not Currently    Alcohol/week: 0.0 standard drinks    Comment: 1 beer per year   . Drug use: No  . Sexual activity: Not Currently  Lifestyle  . Physical activity    Days per week: Not on file    Minutes per session: Not on file  . Stress: Not on file  Relationships  . Social Product manager on phone: Not on file    Gets together: Not on file    Attends religious service: Not on file    Active member of club or organization: Not on file    Attends meetings of clubs or organizations: Not on file    Relationship status: Not on file  . Intimate partner violence    Fear of current or ex partner: Not on file    Emotionally abused: Not on file  Physically abused: Not on file    Forced sexual activity: Not on file  Other Topics Concern  . Not on file  Social History Narrative   Social History      Diet?       Do you drink/eat things with caffeine? Some diet coke      Marital status?                married                    What year were you married? 1962      Do you live in a house, apartment, assisted living, condo, trailer, etc.? condo      Is it one or more stories? Yes- do not use upstairs      How many persons live in your home? 2      Do you have any pets in your home? (please list) no      Highest level of education completed? Masters      Current or past profession: Brewing technologist      Do you exercise?        no                              Type & how often? n/a      Advanced Directives      Do you have a living will? no      Do you have a DNR form?       no                           If not, do you want to discuss one? yes      Do you have signed POA/HPOA for forms? No- has durable POA      Functional Status      Do you have difficulty bathing or dressing yourself? difficulty      Do you have difficulty preparing food or eating?  Heat up food- do not cook      Do you have difficulty managing your medications? Son puts in containers      Do you have difficulty managing your finances? Son is doing      Do you have difficulty affording your medications? no           Pertinent  Health Maintenance Due  Topic Date Due  . INFLUENZA VACCINE  03/31/2019  . PNA vac Low Risk Adult  Completed    Medications: Allergies as of  02/28/2019      Reactions   Haldol [haloperidol Lactate]    Over-sedation and at times agitation       Medication List       Accurate as of February 28, 2019  2:14 PM. If you have any questions, ask your nurse or doctor.        acetaminophen 325 MG tablet Commonly known as: TYLENOL Take 650 mg by mouth every 6 (six) hours as needed for mild pain or moderate pain.   calcium carbonate 500 MG chewable tablet Commonly known as: TUMS - dosed in mg elemental calcium Chew 1 tablet by mouth daily.   carvedilol 25 MG tablet Commonly known as: COREG Take 1 tablet (25 mg total) by mouth 2 (two) times daily.   dutasteride 0.5 MG capsule Commonly known as: AVODART Take 0.5 mg by mouth daily.   Eliquis  2.5 MG Tabs tablet Generic drug: apixaban TAKE 1 TABLET BY MOUTH TWICE A DAY   ipratropium-albuterol 0.5-2.5 (3) MG/3ML Soln Commonly known as: DUONEB Take 3 mLs by nebulization every 4 (four) hours as needed.   levothyroxine 175 MCG tablet Commonly known as: SYNTHROID TAKE 1 TABLET (175 MCG TOTAL) BY MOUTH DAILY BEFORE BREAKFAST.   Melatonin 10 MG Caps Take 10 mg by mouth daily as needed (for sleep).   memantine 28 MG Cp24 24 hr capsule Commonly known as: NAMENDA XR TAKE 1 CAPSULE (28 MG TOTAL) BY MOUTH DAILY.   NON FORMULARY Diet Type:  Regular NAS. Heart Healthy   potassium chloride 10 MEQ tablet Commonly known as: K-DUR Take 10 mEq by mouth daily.   Spiriva Respimat 2.5 MCG/ACT Aers Generic drug: Tiotropium Bromide Monohydrate Inhale 2 puffs into the lungs daily.        Vitals:   02/28/19 1406  BP: 110/62  Pulse: 82  Resp: 18  Temp: 98 F (36.7 C)  TempSrc: Oral  SpO2: 99%  Weight: 225 lb 3.2 oz (102.2 kg)  Height: 6' (1.829 m)   Body mass index is 30.54 kg/m.  Physical Exam  GENERAL APPEARANCE: Alert, conversant. No acute distress.  HEENT: Unremarkable. RESPIRATORY: Breathing is even, unlabored. Lung sounds are clear   CARDIOVASCULAR: Heart RRR no  murmurs, rubs or gallops. No peripheral edema.  GASTROINTESTINAL: Abdomen is soft, non-tender, not distended w/ normal bowel sounds.  NEUROLOGIC: Cranial nerves 2-12 grossly intact. Moves all extremities   Labs reviewed: Basic Metabolic Panel: Recent Labs    04/27/18 0416  05/01/18 0425 05/02/18 0432 05/03/18 0428  10/16/18 0831 11/14/18 1404 02/03/19 1940  NA 141   < > 137 138 138   < > 143 141 137  K 4.2   < > 3.9 4.1 4.2   < > 4.4 5.1 4.4  CL 100   < > 99 98 97*   < > 108 106 105  CO2 32   < > 28 32 32   < > _0 GLUCOSE 125*   < > 96 105* 114*   < > 104* 97 98  BUN 25*   < > _1 < > _2 CREATININE 2.34*   < > 1.86* 2.04* 1.88*   < > 1.51* 1.69* 1.74*  CALCIUM 9.0   < > 8.3* 8.4* 8.8*   < > 8.9 8.7 8.6*  MG 2.1  --   --   --  2.3  --   --   --  1.9  PHOS 2.8   < > 3.9 3.6 3.8  --   --   --   --    < > = values in this interval not displayed.   No results found for: [facility] Liver Function Tests: Recent Labs    05/04/18 0333  09/04/18 0353  10/16/18 0831 11/14/18 1404 02/03/19 1940  AST 26   < > 149*   < > _3 ALT 18   < > 58*   < > 43 13 21  ALKPHOS 65  --  160*  --   --   --  111  BILITOT 0.8   < > 1.2   < > 1.0 0.6 1.2  PROT 6.3*   < > 6.3*   < > 6.5 6.5 7.0  ALBUMIN 3.4*  --  2.9*  --   --   --  3.0*   < > =  values in this interval not displayed.   Recent Labs    09/04/18 0353 02/03/19 1940  LIPASE 32 30   No results for input(s): AMMONIA in the last 8760 hours. CBC: Recent Labs    04/27/18 0416  07/12/18 1002 09/04/18 0105 09/29/18 1059 02/03/19 1940  WBC 6.2   < > 5.4 6.8 4.4 5.0  NEUTROABS 3.4  --  3,737  --  2,768  --   HGB 12.4*   < > 9.8* 11.5* 11.7* 10.3*  HCT 39.4   < > 29.9* 38.8* 35.8* 33.6*  MCV 98.3   < > 94.0 94.9 88.2 94.9  PLT 186   < > 176 181 175 159   < > = values in this interval not displayed.   Lipid Recent Labs    07/12/18 1002 11/14/18 1404  CHOL 87 126  HDL 28* 28*  LDLCALC 41 75  TRIG  92 147   Cardiac Enzymes: Recent Labs    09/04/18 1313 09/04/18 1752 02/03/19 1940  TROPONINI <0.03 <0.03 <0.03   BNP: Recent Labs    02/03/19 1940  BNP 244.6*   CBG: Recent Labs    04/26/18 0440  GLUCAP 136*    Procedures and Imaging Studies During Stay: Dg Chest Portable 1 View  Result Date: 02/03/2019 CLINICAL DATA:  Poor historian.  Weakness. EXAM: PORTABLE CHEST 1 VIEW COMPARISON:  09/04/2018 and chest CT 12/13/2018 FINDINGS: Sternotomy wires unchanged. Lungs are adequately inflated with stable elevation of the right hemidiaphragm. No evidence of focal airspace consolidation or effusion. Mild stable cardiomegaly. Remainder of the exam is unchanged. IMPRESSION: No acute cardiopulmonary disease. Stable cardiomegaly and stable elevation of the right hemidiaphragm. Electronically Signed   By: Marin Olp M.D.   On: 02/03/2019 19:56    Assessment/Plan:   Generalized weakness  Dementia with behavioral disturbance  Paroxysmal atrial fibrillation History of DVT Hypothyroidism BPH  Patient is being discharged with the following home health services:    Patient is being discharged with the following durable medical equipment:    Patient has been advised to f/u with their PCP in 1-2 weeks to bring them up to date on their rehab stay.  Social services at facility was responsible for arranging this appointment.  Pt was provided with a 30 day supply of prescriptions for medications and refills must be obtained from their PCP.  For controlled substances, a more limited supply may be provided adequate until PCP appointment only.  Future labs/tests needed:    Hennie Duos MD

## 2019-03-01 NOTE — Telephone Encounter (Signed)
Patient is in a nursing home. Dr.Alexander refilled medications yesterday. Additional refills can be provided at follow-up appointment

## 2019-03-05 ENCOUNTER — Encounter: Payer: Self-pay | Admitting: Nurse Practitioner

## 2019-03-05 ENCOUNTER — Other Ambulatory Visit: Payer: Self-pay

## 2019-03-05 ENCOUNTER — Ambulatory Visit (INDEPENDENT_AMBULATORY_CARE_PROVIDER_SITE_OTHER): Payer: Medicare Other | Admitting: Nurse Practitioner

## 2019-03-05 DIAGNOSIS — J449 Chronic obstructive pulmonary disease, unspecified: Secondary | ICD-10-CM

## 2019-03-05 DIAGNOSIS — I5032 Chronic diastolic (congestive) heart failure: Secondary | ICD-10-CM

## 2019-03-05 DIAGNOSIS — R531 Weakness: Secondary | ICD-10-CM

## 2019-03-05 DIAGNOSIS — F329 Major depressive disorder, single episode, unspecified: Secondary | ICD-10-CM

## 2019-03-05 DIAGNOSIS — N183 Chronic kidney disease, stage 3 unspecified: Secondary | ICD-10-CM

## 2019-03-05 DIAGNOSIS — I4821 Permanent atrial fibrillation: Secondary | ICD-10-CM

## 2019-03-05 DIAGNOSIS — G301 Alzheimer's disease with late onset: Secondary | ICD-10-CM

## 2019-03-05 DIAGNOSIS — F32A Depression, unspecified: Secondary | ICD-10-CM

## 2019-03-05 DIAGNOSIS — F0281 Dementia in other diseases classified elsewhere with behavioral disturbance: Secondary | ICD-10-CM

## 2019-03-05 DIAGNOSIS — F419 Anxiety disorder, unspecified: Secondary | ICD-10-CM

## 2019-03-05 DIAGNOSIS — Z6835 Body mass index (BMI) 35.0-35.9, adult: Secondary | ICD-10-CM

## 2019-03-05 DIAGNOSIS — F02818 Dementia in other diseases classified elsewhere, unspecified severity, with other behavioral disturbance: Secondary | ICD-10-CM

## 2019-03-05 DIAGNOSIS — E034 Atrophy of thyroid (acquired): Secondary | ICD-10-CM

## 2019-03-05 MED ORDER — ESCITALOPRAM OXALATE 10 MG PO TABS: 10.0000 mg | ORAL_TABLET | Freq: Every day | ORAL | 1 refills | Status: DC

## 2019-03-05 NOTE — Progress Notes (Signed)
This service is provided via telemedicine  No vital signs collected/recorded due to the encounter was a telemedicine visit.   Location of patient (ex: home, work): Home  Patient consents to a telephone visit:  Yes  Location of the provider (ex: office, home):  [facility], Office   Name of any referring provider:  N/A  Names of all persons participating in the telemedicine service and their role in the encounter: S.Chrae B/CMA, Sherrie Mustache, NP, Mr.Chenoweth (son) and Patient   Time spent on call:  11 min with medical assistant      Careteam: Patient Care Team: Lauree Chandler, NP as PCP - General (Geriatric Medicine) Nahser, [name], MD as PCP - Cardiology (Cardiology) Nahser, [name], MD as Consulting Physician (Cardiology) [name], MD as Referring Physician (Urology)  Advanced Directive information Does Patient Have a Medical Advance Directive?: Yes, Type of Advance Directive: Out of facility DNR (pink MOST or yellow form), Pre-existing out of facility DNR order (yellow form or pink MOST form): Yellow form placed in chart (order not valid for inpatient use), Does patient want to make changes to medical advance directive?: No - Guardian declined  Allergies  Allergen Reactions  . Haldol [Haloperidol Lactate]     Over-sedation and at times agitation     Chief Complaint  Patient presents with  . Follow-up    Rehab follow-up and discuss home health orders. Moderate fall risk     HPI: Patient is a 85 y.o. male for follow up from rehab.  Pt went to ED due to generalized weakness and he was transferred to rehab for 3-4 weeks and now home.  He is stronger than prior to going but still needing more rehab.  Using walking and able to transfer.  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   Dementia- continues on namenda. No acute changes  but ongoing decline.   Anxiety- focusing on certain things, a lot of negative focus since he has been out of [facility]. He was previously on medication for anxiety and depression. Previously on wellborn, Buspar, celexa and Remeron last year  Currently living in [place] due to [place] and family support.    Review of Systems:  Review of Systems  Constitutional: Positive for malaise/fatigue and weight loss (eating less, trying to lose some weight. ). Negative for chills and fever.  HENT: Negative for congestion, ear discharge, ear pain and sinus pain.   Eyes: Negative for pain and discharge.  Respiratory: Negative for cough, shortness of breath and wheezing.   Cardiovascular: Positive for leg swelling (improved at this time). Negative for chest pain, palpitations, orthopnea and claudication.       Right leg swelling  Gastrointestinal: Positive for constipation. Negative for abdominal pain, diarrhea, heartburn, nausea and vomiting.  Genitourinary: Negative for flank pain, frequency, hematuria and urgency.  Musculoskeletal: Negative for joint pain and myalgias.  Skin: Negative for itching and rash.  Neurological: Positive for weakness. Negative for dizziness and headaches.  Psychiatric/Behavioral: The patient is nervous/anxious. The patient does not have insomnia.     Past Medical History:  Diagnosis Date  . Anxiety   . Aortic valve insufficiency   . Arthritis    "wee bit; knees, elbows" (10/15/2014)  . Atrial fibrillation ([place])   . Cardiomyopathy ([place])   . Chronic kidney disease    "  down to ~ 1/2 of their regular use; I see kidney dr. in [place]" (10/15/2014)  . Complication of anesthesia    unsure of complications but pt. states there were complications  . COPD (chronic obstructive pulmonary disease) ([place])    on home o2  . Coronary artery disease   . Decreased testosterone level   . Depression   . Difficult intubation    difficult intubation 12/03/09 and 03/27/10 ([facility]);  glidescope used 03/27/10  . DVT (deep venous thrombosis) ([place]) ~ 2013   "BLE"  . Dysrhythmia    A. Fib  . Emphysema of lung ([place])   . Enlarged prostate   . Falls frequently    > 20 times in the last year/notes 10/15/2014  . GERD (gastroesophageal reflux disease)   . Heart murmur   . History of blood transfusion 1960   "lots; related to an accident"  . History of echocardiogram    post AVR >> Echo 7/16:  Mild LVH, EF 20-25%, AVR ok (peak 15 mmHg, mean 9 mmHg), MAC, mild MR, severe LAE, mild reduced RVSF, mild RAE, PASP 35 mmHg  . Hypercholesteremia   . Hypertension   . Hypothyroidism   . OSA (obstructive sleep apnea)    "suppose to wear mask; I throw it off in my sleep" (10/15/2014)  . Plantar fasciitis   . Right fibular fracture 10/15/2014  . Seasonal allergies   . Shortness of breath dyspnea   . Syncope 10/15/2014  . Syncope and collapse "several times"  . Urine frequency   . Urine incontinence   . Varicose veins    Past Surgical History:  Procedure Laterality Date  . ABDOMINAL AORTIC ANEURYSM REPAIR  01/2006; 03/10/2006   Archie Endo 01/12/2011  . AORTIC VALVE REPLACEMENT N/A 02/11/2015   Procedure: AORTIC VALVE REPLACEMENT (AVR);  Surgeon: Grace Isaac, MD;  Location: [place];  Service: Open Heart Surgery;  Laterality: N/A;  . BRAIN SURGERY  1960 X 4   "S/P got my skull busted"; pt. states removed internal carotid artery  . CARDIAC CATHETERIZATION  1990's X 1; 09/2014   no stents  . CLIPPING OF ATRIAL APPENDAGE N/A 02/11/2015   Procedure: CLIPPING OF ATRIAL APPENDAGE;  Surgeon: Grace Isaac, MD;  Location: [facility];  Service: Open Heart Surgery;  Laterality: N/A;  . ERCP  11/2009   Archie Endo 12/05/2009  . gall stone    . HERNIA REPAIR    . LAPAROSCOPIC CHOLECYSTECTOMY  08/2009   w/IOC/notes 09/18/2009  . LAPAROSCOPIC INCISIONAL / UMBILICAL / VENTRAL HERNIA REPAIR  02/2010   VHR w/mesh/notes 03/28/2010  . NOSE SURGERY    . TEE WITHOUT CARDIOVERSION N/A 02/11/2015   Procedure:  TRANSESOPHAGEAL ECHOCARDIOGRAM (TEE);  Surgeon: Grace Isaac, MD;  Location: [facility];  Service: Open Heart Surgery;  Laterality: N/A;  . TONSILLECTOMY     Social History:   reports that he quit smoking about 44 years ago. His smoking use included cigarettes. He has a 44.00 pack-year smoking history. He has never used smokeless tobacco. He reports previous alcohol use. He reports that he does not use drugs.  Family History  Problem Relation Age of Onset  . Rheum arthritis Mother   . Diabetes Mother   . Heart disease Father   . Rheum arthritis Father   . Kidney disease Father   . Other Sister        Murdered  . Breast cancer Sister   . Colon cancer Neg Hx   . Colon polyps Neg Hx   .  Liver disease Neg Hx     Medications: Patient's Medications  New Prescriptions   No medications on file  Previous Medications   APIXABAN (ELIQUIS) 2.5 MG TABS TABLET    Take 1 tablet (2.5 mg total) by mouth 2 (two) times daily.   BISACODYL (DULCOLAX PO)    Take 1 capsule by mouth as needed.   CARVEDILOL (COREG) 25 MG TABLET    Take 1 tablet (25 mg total) by mouth 2 (two) times daily.   DUTASTERIDE (AVODART) 0.5 MG CAPSULE    Take 1 capsule (0.5 mg total) by mouth daily.   FIBER PO    Take 1 tablet by mouth daily.   IBUPROFEN (ADVIL) 600 MG TABLET    Take 600 mg by mouth every 6 (six) hours as needed.   IPRATROPIUM-ALBUTEROL (DUONEB) 0.5-2.5 (3) MG/3ML SOLN    Take 3 mLs by nebulization every 4 (four) hours as needed.   LEVOTHYROXINE (SYNTHROID) 175 MCG TABLET    Take 1 tablet (175 mcg total) by mouth daily before breakfast.   MELATONIN 10 MG CAPS    Take 10 mg by mouth daily as needed (for sleep).   MEMANTINE (NAMENDA XR) 28 MG CP24 24 HR CAPSULE    Take 1 capsule (28 mg total) by mouth daily.   NON FORMULARY    Diet Type:  Regular NAS. Heart Healthy   POTASSIUM CHLORIDE (K-DUR) 10 MEQ TABLET    Take 1 tablet (10 mEq total) by mouth daily.   TIOTROPIUM BROMIDE MONOHYDRATE (SPIRIVA RESPIMAT) 2.5  MCG/ACT AERS    Inhale 2 puffs into the lungs daily.  Modified Medications   No medications on file  Discontinued Medications   ACETAMINOPHEN (TYLENOL) 325 MG TABLET    Take 650 mg by mouth every 6 (six) hours as needed for mild pain or moderate pain.   CALCIUM CARBONATE (TUMS - DOSED IN MG ELEMENTAL CALCIUM) 500 MG CHEWABLE TABLET    Chew 1 tablet by mouth daily.    Physical Exam:  There were no vitals filed for this visit. There is no height or weight on file to calculate BMI. Wt Readings from Last 3 Encounters:  02/28/19 225 lb 3.2 oz (102.2 kg)  02/07/19 240 lb (108.9 kg)  02/03/19 240 lb (108.9 kg)     Labs reviewed: Basic Metabolic Panel: Recent Labs    04/27/18 0416  05/01/18 0425 05/02/18 0432 05/03/18 0428  07/12/18 1002  09/29/18 1103 10/16/18 0831 11/14/18 1404 02/03/19 1940  NA 141   < > 137 138 138   < > 144   < >  --  143 141 137  K 4.2   < > 3.9 4.1 4.2   < > 4.5   < >  --  4.4 5.1 4.4  CL 100   < > 99 98 97*   < > 105   < >  --  108 106 105  CO2 32   < > 28 32 32   < > 33*   < >  --  _0 GLUCOSE 125*   < > 96 105* 114*   < > 105   < >  --  104* 97 98  BUN 25*   < > _1 < > 24   < >  --  _2 CREATININE 2.34*   < > 1.86* 2.04* 1.88*   < > 1.66*   < >  --  1.51* 1.69* 1.74*  CALCIUM 9.0   < > 8.3* 8.4* 8.8*   < > 8.9   < >  --  8.9 8.7 8.6*  MG 2.1  --   --   --  2.3  --   --   --   --   --   --  1.9  PHOS 2.8   < > 3.9 3.6 3.8  --   --   --   --   --   --   --   TSH  --    < >  --   --   --    < > 0.37*  --  0.10*  --  7.15*  --    < > = values in this interval not displayed.   Liver Function Tests: Recent Labs    05/04/18 0333  09/04/18 0353  10/16/18 0831 11/14/18 1404 02/03/19 1940  AST 26   < > 149*   < > _0 ALT 18   < > 58*   < > 43 13 21  ALKPHOS 65  --  160*  --   --   --  111  BILITOT 0.8   < > 1.2   < > 1.0 0.6 1.2  PROT 6.3*   < > 6.3*   < > 6.5 6.5 7.0  ALBUMIN 3.4*  --  2.9*  --   --   --  3.0*   < > =  values in this interval not displayed.   Recent Labs    09/04/18 0353 02/03/19 1940  LIPASE 32 30   No results for input(s): AMMONIA in the last 8760 hours. CBC: Recent Labs    04/27/18 0416  07/12/18 1002 09/04/18 0105 09/29/18 1059 02/03/19 1940  WBC 6.2   < > 5.4 6.8 4.4 5.0  NEUTROABS 3.4  --  3,737  --  2,768  --   HGB 12.4*   < > 9.8* 11.5* 11.7* 10.3*  HCT 39.4   < > 29.9* 38.8* 35.8* 33.6*  MCV 98.3   < > 94.0 94.9 88.2 94.9  PLT 186   < > 176 181 175 159   < > = values in this interval not displayed.   Lipid Panel: Recent Labs    07/12/18 1002 11/14/18 1404  CHOL 87 126  HDL 28* 28*  LDLCALC 41 75  TRIG 92 147  CHOLHDL 3.1 4.5   TSH: Recent Labs    07/12/18 1002 09/29/18 1103 11/14/18 1404  TSH 0.37* 0.10* 7.15*   A1C: Lab Results  Component Value Date   HGBA1C 5.7 (H) 01/28/2018     Assessment/Plan 1. Generalized weakness Ongoing, needing home health orders to restart therapies at home. - Ambulatory referral to Home Health  2. Late onset Alzheimer's disease with behavioral disturbance ([place]) Progressive decline. Continues on namenda. Staying with son who helps him.   3. Chronic diastolic heart failure (HCC) Stable, has not taken lasix, potassium still on medication list however due to CKD will have him stop medication and follow up BMP through home health as they are not able to come to the office.   4. COPD (chronic obstructive pulmonary disease) with chronic bronchitis (HCC) Stable, without recent exacerbation.   5. Hypothyroidism due to acquired atrophy of thyroid -TSH elevated on last lab, will have home health follow up TSH level at this time. Continues on synthroid 175 mcg daily - Ambulatory referral to [place]  6. Permanent atrial fibrillation Stable,  continues on eliquis BID with coreg 25 mg.   7. Class 2 severe obesity due to excess calories with serious comorbidity and body mass index (BMI) of 35.0 to 35.9 in adult  [facility]) Losing weight with decreased portion size. Continues to eat 3 meals a day.   8. Anxiety and depression -worsening anxiety since he has come off all his medication.  - escitalopram (LEXAPRO) 10 MG tablet; Take 1 tablet (10 mg total) by mouth daily.  Dispense: 30 tablet; Refill: 1  9. CKD (chronic kidney disease) stage 3, GFR 30-59 ml/min (HCC) Worsen renal function on last lab, following with nephrologist. Encourage proper hydration and to avoid NSAIDS (Aleve, Advil, Motrin, Ibuprofen)  - Ambulatory referral to [place]  Next appt: 3 months for routine follow up, sooner if needed  Angellee Cohill K. Faten Frieson, [facility] [phone]    Virtual Visit via Telephone Note  I connected with pt and son on 03/05/19 at  2:15 PM EDT by telephone and verified that I am speaking with the correct person using two identifiers.  Location: Patient: home Provider: office   I discussed the limitations, risks, security and privacy concerns of performing an evaluation and management service by telephone and the availability of in person appointments. I also discussed with the patient that there may be a patient responsible charge related to this service. The patient expressed understanding and agreed to proceed.   I discussed the assessment and treatment plan with the patient. The patient was provided an opportunity to ask questions and all were answered. The patient agreed with the plan and demonstrated an understanding of the instructions.   The patient was advised to call back or seek an in-person evaluation if the symptoms worsen or if the condition fails to improve as anticipated.  I provided 26 minutes of non-face-to-face time during this encounter.  [name] [name]. Harle Battiest Avs printed and mailed

## 2019-03-05 NOTE — Telephone Encounter (Signed)
Message routed to [place], Jessica K, NP  

## 2019-03-05 NOTE — Patient Instructions (Signed)
STOP POTASSIUM, we will follow up labs, suspect this should have been stopped with lasix.

## 2019-03-06 DIAGNOSIS — I482 Chronic atrial fibrillation, unspecified: Secondary | ICD-10-CM

## 2019-03-06 DIAGNOSIS — I2510 Atherosclerotic heart disease of native coronary artery without angina pectoris: Secondary | ICD-10-CM

## 2019-03-06 DIAGNOSIS — N183 Chronic kidney disease, stage 3 (moderate): Secondary | ICD-10-CM

## 2019-03-06 DIAGNOSIS — E039 Hypothyroidism, unspecified: Secondary | ICD-10-CM

## 2019-03-06 DIAGNOSIS — I130 Hypertensive heart and chronic kidney disease with heart failure and stage 1 through stage 4 chronic kidney disease, or unspecified chronic kidney disease: Secondary | ICD-10-CM

## 2019-03-06 DIAGNOSIS — D631 Anemia in chronic kidney disease: Secondary | ICD-10-CM

## 2019-03-06 DIAGNOSIS — F0280 Dementia in other diseases classified elsewhere without behavioral disturbance: Secondary | ICD-10-CM

## 2019-03-06 DIAGNOSIS — I69328 Other speech and language deficits following cerebral infarction: Secondary | ICD-10-CM

## 2019-03-06 NOTE — Telephone Encounter (Signed)
Patient had a telephone visit yesterday.

## 2019-03-08 ENCOUNTER — Ambulatory Visit: Payer: Medicare Other | Admitting: Nurse Practitioner

## 2019-03-09 ENCOUNTER — Telehealth: Payer: Self-pay | Admitting: *Deleted

## 2019-03-09 NOTE — Telephone Encounter (Signed)
There was a fax sent to my folder regarding labs, UA C&S ordered along with TSH, BMP and CBC

## 2019-03-09 NOTE — Telephone Encounter (Signed)
Myriam Jacobson with [facility] called and stated that patient is having increased weakness and confusion. Son feels like patient has a UTI. Nurse is wanting to know if she can collect a U/A and Cx. Please Advise.

## 2019-03-23 ENCOUNTER — Telehealth: Payer: Self-pay | Admitting: *Deleted

## 2019-03-23 NOTE — Telephone Encounter (Signed)
Geni with [facility] called and wanted verbal orders to resume care after discharge from [facility].  Verbal orders given.

## 2019-03-27 ENCOUNTER — Other Ambulatory Visit: Payer: Self-pay | Admitting: Nurse Practitioner

## 2019-03-27 DIAGNOSIS — F329 Major depressive disorder, single episode, unspecified: Secondary | ICD-10-CM

## 2019-03-27 DIAGNOSIS — F32A Depression, unspecified: Secondary | ICD-10-CM

## 2019-04-27 ENCOUNTER — Telehealth: Payer: Self-pay | Admitting: *Deleted

## 2019-04-27 ENCOUNTER — Encounter: Payer: Self-pay | Admitting: Nurse Practitioner

## 2019-04-27 NOTE — Telephone Encounter (Signed)
Son, [name] [name] called and stated that [place] Stated that they are staying up there until this COVID is over. Stated that he wants [facility] to come in to do therapy on Monday.   Janett Billow requested a Face to Face in order to properly fill out the [place]. Son made a virtual visit appointment for Monday with [name] [name] but wants to know if the paperwork could be signed now so there would be no delay in patient's therapy.   I spoke with Janett Billow and she stated that they could go ahead and sign it as long as patient is seen within 30 days.    I called [place] and spoke with Anderson Malta to have the orders faxed to our office. She stated that she will fax.   Informed son.

## 2019-04-27 NOTE — Telephone Encounter (Signed)
He will need follow up visit for any orders to be signed this will need to be a face to face

## 2019-04-27 NOTE — Telephone Encounter (Signed)
I copied and pasted Jessica's reply to patient/family

## 2019-04-27 NOTE — Telephone Encounter (Signed)
Message forward to Lauree Chandler, NP and clinical intake to confirm form received.

## 2019-04-30 ENCOUNTER — Encounter: Payer: Self-pay | Admitting: Family

## 2019-04-30 ENCOUNTER — Ambulatory Visit (INDEPENDENT_AMBULATORY_CARE_PROVIDER_SITE_OTHER): Payer: Medicare Other | Admitting: Family

## 2019-04-30 ENCOUNTER — Other Ambulatory Visit: Payer: Self-pay

## 2019-04-30 VITALS — BP 110/72 | HR 68 | Temp 98.4°F

## 2019-04-30 DIAGNOSIS — I38 Endocarditis, valve unspecified: Secondary | ICD-10-CM

## 2019-04-30 DIAGNOSIS — K5901 Slow transit constipation: Secondary | ICD-10-CM

## 2019-04-30 DIAGNOSIS — N183 Chronic kidney disease, stage 3 unspecified: Secondary | ICD-10-CM

## 2019-04-30 DIAGNOSIS — N401 Enlarged prostate with lower urinary tract symptoms: Secondary | ICD-10-CM

## 2019-04-30 DIAGNOSIS — Z86718 Personal history of other venous thrombosis and embolism: Secondary | ICD-10-CM

## 2019-04-30 DIAGNOSIS — J4489 Other specified chronic obstructive pulmonary disease: Secondary | ICD-10-CM

## 2019-04-30 DIAGNOSIS — R2681 Unsteadiness on feet: Secondary | ICD-10-CM

## 2019-04-30 DIAGNOSIS — F0390 Unspecified dementia without behavioral disturbance: Secondary | ICD-10-CM

## 2019-04-30 DIAGNOSIS — E785 Hyperlipidemia, unspecified: Secondary | ICD-10-CM | POA: Diagnosis not present

## 2019-04-30 DIAGNOSIS — E034 Atrophy of thyroid (acquired): Secondary | ICD-10-CM

## 2019-04-30 DIAGNOSIS — G4733 Obstructive sleep apnea (adult) (pediatric): Secondary | ICD-10-CM

## 2019-04-30 DIAGNOSIS — D508 Other iron deficiency anemias: Secondary | ICD-10-CM

## 2019-04-30 DIAGNOSIS — I4821 Permanent atrial fibrillation: Secondary | ICD-10-CM

## 2019-04-30 DIAGNOSIS — I10 Essential (primary) hypertension: Secondary | ICD-10-CM

## 2019-04-30 DIAGNOSIS — T826XXD Infection and inflammatory reaction due to cardiac valve prosthesis, subsequent encounter: Secondary | ICD-10-CM | POA: Diagnosis not present

## 2019-04-30 DIAGNOSIS — J449 Chronic obstructive pulmonary disease, unspecified: Secondary | ICD-10-CM

## 2019-04-30 DIAGNOSIS — R3911 Hesitancy of micturition: Secondary | ICD-10-CM

## 2019-04-30 DIAGNOSIS — M6281 Muscle weakness (generalized): Secondary | ICD-10-CM

## 2019-04-30 MED ORDER — MEMANTINE HCL ER 28 MG PO CP24: 28.0000 mg | ORAL_CAPSULE | Freq: Every day | ORAL | 1 refills | Status: DC

## 2019-04-30 NOTE — Progress Notes (Signed)
This service is provided via telemedicine  No vital signs collected/recorded due to the encounter was a telemedicine visit.   Location of patient (ex: home, work):  Home   Patient consents to a telephone visit:  Yes  Location of the provider (ex: office, home):  Office   Name of any referring provider: Sherrie Mustache, NP   Names of all persons participating in the telemedicine service and their role in the encounter: Marlowe Sax, NP, Ruthell Rummage CMA, [name] and son   Time spent on call: Ruthell Rummage CMA spent 11 minutes on on phone with patient    Provider: Dinah Ngetich FNP-C  Lauree Chandler, NP  Patient Care Team: Lauree Chandler, NP as PCP - General (Geriatric Medicine) Nahser, [name], MD as PCP - Cardiology (Cardiology) Nahser, [name], MD as Consulting Physician (Cardiology) [name], MD as Referring Physician (Urology)  Extended Emergency Contact Information Primary Emergency Contact: Zuckerman,[name] Address: [address]          [place], [place] [address] [name] Phone: [phone] Mobile Phone: [phone] Relation: Son Secondary Emergency Contact: Sliger,[name] Address: [place]          [place] Phone: [phone] Relation: Spouse  Code Status: DNR Goals of care: Advanced Directive information Advanced Directives 03/05/2019  Does Patient Have a Medical Advance Directive? Yes  Type of Advance Directive Out of facility DNR (pink MOST or yellow form)  Does patient want to make changes to medical advance directive? No - Guardian declined  Copy of [place] in Chart? -  Would patient like information on creating a medical advance directive? -  Pre-existing out of facility DNR order (yellow form or pink MOST form) Yellow form placed in chart (order not valid for inpatient use)     Chief Complaint  Patient presents with  . Form Completion     Face-to-Face virtual video visit     HPI:  Pt is a 85 y.o. male seen today for an acute visit for follow discharge from rehabilitation center post Hospital admission from 03/09/2019 - 03/16/2019 with right leg weakness and hypotension.He has a medical history of Hypertension,Afib on EliQuis,Chronic diastolic heart failure,Obstructive sleep apnea,COPD,GERD,Hypothyroidism,Dementia with behavioral disturbance,hyperlipidemia,obesity,iron deficiency anemia,CKD stage 3 BPH,dementia without behavioral disturbance among other conditions.He is [place] staying [place] [facility] due to [place] and will be there until COVID-19 resolve for now.His Hgb in the ED was 8.2 and Guaiac stool positive.Also had elevated Creatinine suspected due to CKD.He also complained of burning with urination and mild suprapubic abdominal pain.He was admitted for supportive care.Later Hgb dropped to 7.7 he received 2 units of PRBC and his EliQuis was held.Hgb stabilized and GI bleeding resolved.He had IVF.Urine specimen collected was treated initially with Vancomycin.He  was tested for COVID-19 not detected.Blood cultures was positive for streptococcus gordonil resistant to erythromycin.Endoscopy and colonoscopy initially planned was put on hold due to positive cultures.Echocardiography showed vegetation on his prosthetic aortic valve previously noted on his mitral valve but was an error.It's on his bioprosthetic aortic valve.Cardiology was consulted who recommended conservative management and no need for transfer.He was started on penicillin then switched to 2 Gm Rocephin via PICC line for 4-6 weeks.Has TEE in 1-2 weeks per cardiology recommendation.No chest pain or evidence of heart failure was noted.He remained stable and was discharged to [facility].Per son patient was discharge from rehab 04/29/2019.He states  doing well at home.He was discharged with Home health Nurse and  Physical Therapy for right leg weakness.[facility] ( [address],[place] [address] Telephone [phone], Fax [phone]) present during visit.Patient complains of no bowel movement x 3 days but son states has given him Dulcolax  Just prior to visit.He states Dulcolax usually effective.He has been up walking using his walker.He has upcoming appointment with cardiology 06/11/2019 per son.No recent fall episode.son states he has had progressive weight loss which is not new to patient.appetite is described as good. He states patient's foreskin not retracted was noted at the hospital plan to for possible surgery.  Patient was previous started on Lexapro 10 mg tablet but son states patient not taking medication.Depression symptoms have resolved.            Past Medical History:  Diagnosis Date  . Anxiety   . Aortic valve insufficiency   . Arthritis    "wee bit; knees, elbows" (10/15/2014)  . Atrial fibrillation ([place])   . Cardiomyopathy ([place])   . Chronic kidney disease    "down to ~ 1/2 of their regular use; I see kidney dr. in [place]" (10/15/2014)  . Complication of anesthesia    unsure of complications but pt. states there were complications  . COPD (chronic obstructive pulmonary disease) ([place])    on home o2  . Coronary artery disease   . Decreased testosterone level   . Depression   . Difficult intubation    difficult intubation 12/03/09 and 03/27/10 ([facility]); glidescope used 03/27/10  . DVT (deep venous thrombosis) ([place]) ~ 2013   "BLE"  . Dysrhythmia    A. Fib  . Emphysema of lung ([place])   . Enlarged prostate   . Falls frequently    > 20 times in the last year/notes 10/15/2014  . GERD (gastroesophageal reflux disease)   . Heart murmur   . History of blood transfusion 1960   "lots; related to an accident"  . History of echocardiogram    post AVR >> Echo 7/16:  Mild LVH, EF 20-25%, AVR ok (peak 15 mmHg, mean 9 mmHg), MAC, mild MR, severe LAE, mild reduced  RVSF, mild RAE, PASP 35 mmHg  . Hypercholesteremia   . Hypertension   . Hypothyroidism   . OSA (obstructive sleep apnea)    "suppose to wear mask; I throw it off in my sleep" (10/15/2014)  . Plantar fasciitis   . Right fibular fracture 10/15/2014  . Seasonal allergies   . Shortness of breath dyspnea   . Syncope 10/15/2014  . Syncope and collapse "several times"  . Urine frequency   . Urine incontinence   . Varicose veins    Past Surgical History:  Procedure Laterality Date  . ABDOMINAL AORTIC ANEURYSM REPAIR  01/2006; 03/10/2006   Archie Endo 01/12/2011  . AORTIC VALVE REPLACEMENT N/A 02/11/2015   Procedure: AORTIC VALVE REPLACEMENT (AVR);  Surgeon: Grace Isaac, MD;  Location: [facility];  Service: Open Heart Surgery;  Laterality: N/A;  . BRAIN SURGERY  1960 X 4   "S/P got my skull busted"; pt. states removed internal carotid artery  . CARDIAC CATHETERIZATION  1990's X 1; 09/2014   no stents  . CLIPPING OF ATRIAL APPENDAGE N/A 02/11/2015   Procedure: CLIPPING OF ATRIAL APPENDAGE;  Surgeon: Grace Isaac, MD;  Location: [facility];  Service: Open Heart Surgery;  Laterality: N/A;  . ERCP  11/2009   Archie Endo 12/05/2009  . gall stone    .  HERNIA REPAIR    . LAPAROSCOPIC CHOLECYSTECTOMY  08/2009   w/IOC/notes 09/18/2009  . LAPAROSCOPIC INCISIONAL / UMBILICAL / VENTRAL HERNIA REPAIR  02/2010   VHR w/mesh/notes 03/28/2010  . NOSE SURGERY    . TEE WITHOUT CARDIOVERSION N/A 02/11/2015   Procedure: TRANSESOPHAGEAL ECHOCARDIOGRAM (TEE);  Surgeon: Grace Isaac, MD;  Location: [facility];  Service: Open Heart Surgery;  Laterality: N/A;  . TONSILLECTOMY      Allergies  Allergen Reactions  . Haldol [Haloperidol Lactate]     Over-sedation and at times agitation     Outpatient Encounter Medications as of 04/30/2019  Medication Sig  . Bisacodyl (DULCOLAX PO) Take 1 capsule by mouth as needed.  . carvedilol (COREG) 25 MG tablet Take 1 tablet (25 mg total) by mouth 2 (two) times daily.  Marland Kitchen dutasteride  (AVODART) 0.5 MG capsule Take 1 capsule (0.5 mg total) by mouth daily.  Marland Kitchen FIBER PO Take 1 tablet by mouth daily.  Marland Kitchen ibuprofen (ADVIL) 600 MG tablet Take 600 mg by mouth every 6 (six) hours as needed.  Marland Kitchen ipratropium-albuterol (DUONEB) 0.5-2.5 (3) MG/3ML SOLN Take 3 mLs by nebulization every 4 (four) hours as needed.  Marland Kitchen levothyroxine (SYNTHROID) 175 MCG tablet Take 1 tablet (175 mcg total) by mouth daily before breakfast.  . Melatonin 10 MG CAPS Take 10 mg by mouth daily as needed (for sleep).  . memantine (NAMENDA XR) 28 MG CP24 24 hr capsule Take 1 capsule (28 mg total) by mouth daily.  . NON FORMULARY Diet Type:  Regular NAS. Heart Healthy  . Tiotropium Bromide Monohydrate (SPIRIVA RESPIMAT) 2.5 MCG/ACT AERS Inhale 2 puffs into the lungs daily.  Marland Kitchen escitalopram (LEXAPRO) 10 MG tablet Take 1 tablet by mouth daily : DX F41.9, F32.9 (Patient not taking: Reported on 04/30/2019)  . [DISCONTINUED] apixaban (ELIQUIS) 2.5 MG TABS tablet Take 1 tablet (2.5 mg total) by mouth 2 (two) times daily.   No facility-administered encounter medications on file as of 04/30/2019.     Review of Systems  Constitutional: Negative for appetite change, chills, fatigue and fever.  HENT: Negative for congestion, rhinorrhea, sinus pressure, sinus pain, sneezing and sore throat.   Eyes: Negative for discharge, redness and itching.  Respiratory: Positive for shortness of breath. Negative for cough and chest tightness.        COPD chronic shortness of breath on 3 liters via [place] coughs clear.Per son has not required his Duoneb  Cardiovascular: Negative for chest pain, palpitations and leg swelling.  Gastrointestinal: Positive for constipation. Negative for abdominal distention, abdominal pain, diarrhea, nausea and vomiting.       LBM 3 days ago   Endocrine: Negative for cold intolerance, heat intolerance, polydipsia, polyphagia and polyuria.  Genitourinary: Negative for difficulty urinating, dysuria, flank pain and frequency.   Musculoskeletal: Positive for gait problem.       Right leg chronic pain   Skin: Negative for color change, pallor and rash.  Neurological: Negative for dizziness, light-headedness and headaches.       Right leg weakness and numbness   Psychiatric/Behavioral: Positive for sleep disturbance. Negative for agitation and confusion. The patient is not nervous/anxious.     Immunization History  Administered Date(s) Administered  . Influenza, High Dose Seasonal PF 05/29/2013, 06/09/2016, 05/05/2018  . Influenza-Unspecified 04/30/2014, 08/15/2014  . Pneumococcal Conjugate-13 08/15/2014  . Pneumococcal Polysaccharide-23 04/29/2010   Pertinent  Health Maintenance Due  Topic Date Due  . INFLUENZA VACCINE  03/31/2019  . PNA vac Low Risk Adult  Completed  Fall Risk  04/30/2019 03/05/2019 09/29/2018 07/12/2018 06/27/2018  Falls in the past year? 0 1 1 0 Yes  Comment - - - - -  Number falls in past yr: 0 1 0 - 1  Comment - - - - -  Injury with Fall? 0 0 0 - No  Risk Factor Category  - - - - -  Risk for fall due to : - - - - -  Risk for fall due to: Comment - - - - -  Follow up - - - - -  Comment - - - - -    Vitals:   04/30/19 1355  BP: 110/72  Pulse: 68  Temp: 98.4 F (36.9 C)  TempSrc: Oral  SpO2: 98%   There is no height or weight on file to calculate BMI. Physical Exam Unable to complete on telephone.   Labs reviewed: Recent Labs    05/01/18 0425 05/02/18 0432 05/03/18 0428  10/16/18 0831 11/14/18 1404 02/03/19 1940  NA 137 138 138   < > 143 141 137  K 3.9 4.1 4.2   < > 4.4 5.1 4.4  CL 99 98 97*   < > 108 106 105  CO2 28 32 32   < > _0 GLUCOSE 96 105* 114*   < > 104* 97 98  BUN _1 < > _2 CREATININE 1.86* 2.04* 1.88*   < > 1.51* 1.69* 1.74*  CALCIUM 8.3* 8.4* 8.8*   < > 8.9 8.7 8.6*  MG  --   --  2.3  --   --   --  1.9  PHOS 3.9 3.6 3.8  --   --   --   --    < > = values in this interval not displayed.   Recent Labs    05/04/18 0333   09/04/18 0353  10/16/18 0831 11/14/18 1404 02/03/19 1940  AST 26   < > 149*   < > _3 ALT 18   < > 58*   < > 43 13 21  ALKPHOS 65  --  160*  --   --   --  111  BILITOT 0.8   < > 1.2   < > 1.0 0.6 1.2  PROT 6.3*   < > 6.3*   < > 6.5 6.5 7.0  ALBUMIN 3.4*  --  2.9*  --   --   --  3.0*   < > = values in this interval not displayed.   Recent Labs    07/12/18 1002 09/04/18 0105 09/29/18 1059 02/03/19 1940  WBC 5.4 6.8 4.4 5.0  NEUTROABS 3,737  --  2,768  --   HGB 9.8* 11.5* 11.7* 10.3*  HCT 29.9* 38.8* 35.8* 33.6*  MCV 94.0 94.9 88.2 94.9  PLT 176 181 175 159   Lab Results  Component Value Date   TSH 7.15 (H) 11/14/2018   Lab Results  Component Value Date   HGBA1C 5.7 (H) 01/28/2018   Lab Results  Component Value Date   CHOL 126 11/14/2018   HDL 28 (L) 11/14/2018   LDLCALC 75 11/14/2018   TRIG 147 11/14/2018   CHOLHDL 4.5 11/14/2018    Significant Diagnostic Results in last 30 days:  No results found.  Assessment/Plan 1. Endocarditis of prosthetic valve, subsequent encounter Afebrile.Latest WBC 6.2 continue on Rocephin 2 GM via PICC line per cardiology orders. - [place] present during  visit given verbal orders to draw CBC/diff,CMP then fax results to [facility] office.  2. Other iron deficiency anemia Hgb 8.2> 7.7> 8.2 due to GI bleed secondary to anticoagulant.EliQuis Held during admission with stable Hgb. - CBC/diff as above.   3. Essential hypertension B/p stable.continue on Coreg 25 mg tablet twice daily.   4. Hyperlipidemia, unspecified hyperlipidemia type LDL at goal.Not on medication.continue on low carbohydrate,low saturated fats and high vegetable diet.   5. CKD (chronic kidney disease) stage 3, GFR 30-59 ml/min (HCC) CR 1.6 at baseline.continue to avoid nephrotoxins and dose all medication for renal clearance.   6. Permanent atrial fibrillation On Coreg.Eliquis discontinued during recent hospitalization due to low hemoglobin  with positive stool Guaiac.   7. COPD (chronic obstructive pulmonary disease) with chronic bronchitis (HCC) Breathing reported stable on Spiriva 2.5 mcg  2 puffs daily and oxygen 3 liters via nasal cannula.Has Duoneb via Nebulizer but has not required.   8. OSA (obstructive sleep apnea) On going.  9. Hypothyroidism due to acquired atrophy of thyroid He missed TSH lab draw as previously ordered by PCP.HHN casey notified to draw TSH level then fax result to [facility] office.   10. History of DVT (deep vein thrombosis) EliQuis discontinued due to GI bleed.   11. Muscle weakness Right leg weakness reported.Discharge with PT/OT for muscle strengthening.   12. Unsteady gait Continue on PT/OT gait stability.   13. Dementia without Behavioral disturbance No new behavioral issues reported.Progressive weight loss reported.declined expected due to advance age.continue with supportive care.   14 BPH  No urine retention reported.Continue on Avodart 0.5 mg capsule daily.   15. Constipation  No BM x 3 days.Son has given him Dulcolax one capsule which is usually effective.No BM so far.recommended adding colace 100 mg capsule daily.son states Dulcolax usually effective.son will notify provider if no BM results.Encouraged oral intake and hydration.   Family/ staff Communication: Reviewed plan of care with patient,son and home health Nurse Myriam Jacobson.    Labs/tests ordered:  [facility] ([address],[place] [address] Telephone [phone], Fax [phone])  to draw CBC/diff,CMP and TSH level then fax results to [facility] office.   Spent 25 minutes of non-face to face with patient    Sandrea Hughs, NP

## 2019-05-01 HISTORY — PX: CIRCUMCISION: SUR203

## 2019-05-05 DIAGNOSIS — N183 Chronic kidney disease, stage 3 (moderate): Secondary | ICD-10-CM

## 2019-05-05 DIAGNOSIS — I5030 Unspecified diastolic (congestive) heart failure: Secondary | ICD-10-CM

## 2019-05-05 DIAGNOSIS — D631 Anemia in chronic kidney disease: Secondary | ICD-10-CM

## 2019-05-05 DIAGNOSIS — J439 Emphysema, unspecified: Secondary | ICD-10-CM

## 2019-05-05 DIAGNOSIS — I130 Hypertensive heart and chronic kidney disease with heart failure and stage 1 through stage 4 chronic kidney disease, or unspecified chronic kidney disease: Secondary | ICD-10-CM

## 2019-05-05 DIAGNOSIS — T826XXD Infection and inflammatory reaction due to cardiac valve prosthesis, subsequent encounter: Secondary | ICD-10-CM

## 2019-05-05 DIAGNOSIS — I330 Acute and subacute infective endocarditis: Secondary | ICD-10-CM

## 2019-05-05 DIAGNOSIS — E039 Hypothyroidism, unspecified: Secondary | ICD-10-CM

## 2019-05-11 LAB — BASIC METABOLIC PANEL
BUN: 20 (ref 4–21)
Creatinine: 1.7 — AB (ref 0.6–1.3)
Glucose: 93
Potassium: 4.3 (ref 3.4–5.3)
Sodium: 139 (ref 137–147)

## 2019-05-11 LAB — CBC AND DIFFERENTIAL
HCT: 34 — AB (ref 41–53)
Hemoglobin: 10.1 — AB (ref 13.5–17.5)
Neutrophils Absolute: 3
Platelets: 199 (ref 150–399)
WBC: 5.1

## 2019-05-11 LAB — HEPATIC FUNCTION PANEL
ALT: 16 (ref 10–40)
AST: 16 (ref 14–40)
Alkaline Phosphatase: 121 (ref 25–125)
Bilirubin, Total: 0.4

## 2019-05-11 LAB — TSH: TSH: 0.2 — AB (ref 0.41–5.90)

## 2019-05-16 ENCOUNTER — Encounter: Payer: Self-pay | Admitting: *Deleted

## 2019-05-16 NOTE — Progress Notes (Signed)
[place] [phone]

## 2019-05-18 ENCOUNTER — Telehealth: Payer: Self-pay | Admitting: *Deleted

## 2019-05-18 NOTE — Telephone Encounter (Signed)
Son, Leonor Liv called and stated that patient was discharged from hospital in [place] and the Home Health[facility] needs orders signed for [place]. Orders faxed and placed in [place] folder for review.  Home Health [phone] Fax: [phone]

## 2019-05-21 ENCOUNTER — Ambulatory Visit: Payer: Self-pay | Admitting: Nurse Practitioner

## 2019-05-21 ENCOUNTER — Ambulatory Visit (INDEPENDENT_AMBULATORY_CARE_PROVIDER_SITE_OTHER): Payer: Medicare Other | Admitting: Family

## 2019-05-21 ENCOUNTER — Encounter: Payer: Self-pay | Admitting: Family

## 2019-05-21 ENCOUNTER — Other Ambulatory Visit: Payer: Self-pay

## 2019-05-21 DIAGNOSIS — E034 Atrophy of thyroid (acquired): Secondary | ICD-10-CM | POA: Diagnosis not present

## 2019-05-21 DIAGNOSIS — Z9889 Other specified postprocedural states: Secondary | ICD-10-CM

## 2019-05-21 DIAGNOSIS — I959 Hypotension, unspecified: Secondary | ICD-10-CM | POA: Diagnosis not present

## 2019-05-21 DIAGNOSIS — R531 Weakness: Secondary | ICD-10-CM

## 2019-05-21 DIAGNOSIS — J449 Chronic obstructive pulmonary disease, unspecified: Secondary | ICD-10-CM | POA: Diagnosis not present

## 2019-05-21 DIAGNOSIS — R2681 Unsteadiness on feet: Secondary | ICD-10-CM

## 2019-05-21 MED ORDER — LEVOTHYROXINE SODIUM 150 MCG PO TABS: 150.0000 ug | ORAL_TABLET | Freq: Every day | ORAL | 0 refills | Status: DC

## 2019-05-21 NOTE — Progress Notes (Signed)
Provider: Dinah Ngetich FNP-C  Lauree Chandler, NP  Patient Care Team: Lauree Chandler, NP as PCP - General (Geriatric Medicine) Nahser, [name], MD as PCP - Cardiology (Cardiology) Nahser, [name], MD as Consulting Physician (Cardiology) [name], MD as Referring Physician (Urology)  Extended Emergency Contact Information Primary Emergency Contact: Laine,[name] Address: [place], [place] [address] [name] Phone: [phone] Mobile Phone: [phone] Relation: Son Secondary Emergency Contact: Rozier,[name] Address: [place], [place] Phone: [phone] Relation: Spouse   This service is provided via telemedicine visit   No vital signs collected/recorded due to the encounter was a telemedicine visit.   Location of patient (ex: home, work):  Home   Patient consents to a T visit:  Yes   Location of the provider (ex: office, home):  Office   Name of any referring provider: Sherrie Mustache, NP   Names of all persons participating in the telemedicine service and their role in the encounter:  Dinah Ngetich NP, Ruthell Rummage CMA, [name], and son   Time spent on call:  Ruthell Rummage CMA spent 8 minutes on phone with patient   Code Status:  DNR Goals of care: Advanced Directive information Advanced Directives 03/05/2019  Does Patient Have a Medical Advance Directive? Yes  Type of Advance Directive Out of facility DNR (pink MOST or yellow form)  Does patient want to make changes to medical advance directive? No - Guardian declined  Copy of [place] in Chart? -  Would patient like information on creating a medical advance directive? -  Pre-existing out of facility DNR order (yellow form or pink MOST form) Yellow form placed in chart (order not valid for inpatient use)     Chief Complaint  Patient presents with  . Follow-up    Patient  surgery last Tuesday and discharged Friday low blood when standing  faxed over paper work      HPI:  Pt is a 85 y.o. male seen today on video call for an acute visit for follow up Hospitalization 05/15/2019-05/18/2019 for circumcision.He has a medical history of Permanent Afib,bioprosthetic aortic valve endocarditis recently completed 8 weeks of IV antibiotics therapy via PICC line,COPD oxygen dependant 3 liters via [place],GI bleed,CKD stage 3,Obstructive sleep apnea,Hypothyroidism,dementia among other conditions.Patient's son state patient went to the hospital after circumcision due to Orthostatic hypotension.He was treated with IVF and admitted for observation.He had lab work ,EKG and Chest X-ray done which were unremarkable.His carvedilol was decreased from 25 mg tablet twice daily to 6.25 mg tablet daily.He was also started on Fludrocortisone 0.1 mg tablet one by mouth daily. His condition remained stable.  He was discharged home with son to follow up with outpatient cardiology.He has an appointment with cardiologist 06/11/2019.He also has a follow up appointment with Urologist 05/25/2019.He was also discharge with Home health Nurse and Physical Therapy.Son states patient's blood pressure is back to baseline recently checked by [facility].His blood pressure today was 140/90 son wonders whether we need to get him back on his regular coreg and whether Fludrocortisone 0.1 mg tablet one by mouth daily is needed.He denies any dizziness,headache or worsening leg swelling. Patient states has some burning on incision site but has been voiding without any difficulties.He applies triple antibiotic ointment to incision site daily.No drainage or redness reported.     Hypothyroidism -  Labs done 05/11/2019 TSH level 0.20 he is currently on Levothyroxine 175 mcg tablet.   Past Medical History:  Diagnosis Date  . Anxiety   . Aortic valve insufficiency   . Arthritis    "wee bit; knees, elbows" (10/15/2014)  . Atrial  fibrillation ([place])   . Cardiomyopathy ([place])   . Chronic kidney disease    "down to ~ 1/2 of their regular use; I see kidney dr. in [place]" (10/15/2014)  . Complication of anesthesia    unsure of complications but pt. states there were complications  . COPD (chronic obstructive pulmonary disease) ([place])    on home o2  . Coronary artery disease   . Decreased testosterone level   . Depression   . Difficult intubation    difficult intubation 12/03/09 and 03/27/10 ([facility]); glidescope used 03/27/10  . DVT (deep venous thrombosis) ([place]) ~ 2013   "BLE"  . Dysrhythmia    A. Fib  . Emphysema of lung ([place])   . Enlarged prostate   . Falls frequently    > 20 times in the last year/notes 10/15/2014  . GERD (gastroesophageal reflux disease)   . Heart murmur   . History of blood transfusion 1960   "lots; related to an accident"  . History of echocardiogram    post AVR >> Echo 7/16:  Mild LVH, EF 20-25%, AVR ok (peak 15 mmHg, mean 9 mmHg), MAC, mild MR, severe LAE, mild reduced RVSF, mild RAE, PASP 35 mmHg  . Hypercholesteremia   . Hypertension   . Hypothyroidism   . OSA (obstructive sleep apnea)    "suppose to wear mask; I throw it off in my sleep" (10/15/2014)  . Plantar fasciitis   . Right fibular fracture 10/15/2014  . Seasonal allergies   . Shortness of breath dyspnea   . Syncope 10/15/2014  . Syncope and collapse "several times"  . Urine frequency   . Urine incontinence   . Varicose veins    Past Surgical History:  Procedure Laterality Date  . ABDOMINAL AORTIC ANEURYSM REPAIR  01/2006; 03/10/2006   Archie Endo 01/12/2011  . AORTIC VALVE REPLACEMENT N/A 02/11/2015   Procedure: AORTIC VALVE REPLACEMENT (AVR);  Surgeon: Grace Isaac, MD;  Location: [facility];  Service: Open Heart Surgery;  Laterality: N/A;  . BRAIN SURGERY  1960 X 4   "S/P got my skull busted"; pt. states removed internal carotid artery  . CARDIAC CATHETERIZATION  1990's X 1; 09/2014   no stents  . CLIPPING OF ATRIAL APPENDAGE  N/A 02/11/2015   Procedure: CLIPPING OF ATRIAL APPENDAGE;  Surgeon: Grace Isaac, MD;  Location: [facility];  Service: Open Heart Surgery;  Laterality: N/A;  . ERCP  11/2009   Archie Endo 12/05/2009  . gall stone    . HERNIA REPAIR    . LAPAROSCOPIC CHOLECYSTECTOMY  08/2009   w/IOC/notes 09/18/2009  . LAPAROSCOPIC INCISIONAL / UMBILICAL / VENTRAL HERNIA REPAIR  02/2010   VHR w/mesh/notes 03/28/2010  . NOSE SURGERY    . TEE WITHOUT CARDIOVERSION N/A 02/11/2015   Procedure: TRANSESOPHAGEAL ECHOCARDIOGRAM (TEE);  Surgeon: Grace Isaac, MD;  Location: [facility];  Service: Open Heart Surgery;  Laterality: N/A;  . TONSILLECTOMY      Allergies  Allergen Reactions  . Haldol [Haloperidol Lactate]     Over-sedation and at times agitation     Outpatient Encounter Medications as of 05/21/2019  Medication Sig  . Bisacodyl (DULCOLAX PO) Take 1 capsule by mouth as needed.  . carvedilol (COREG) 6.25 MG tablet  Take 6.25 mg by mouth daily.  Marland Kitchen dutasteride (AVODART) 0.5 MG capsule Take 1 capsule (0.5 mg total) by mouth daily.  Marland Kitchen FIBER PO Take 2 tablets by mouth daily. 4 gram overthecounter [facility]  . ibuprofen (ADVIL) 600 MG tablet Take 600 mg by mouth every 6 (six) hours as needed.  Marland Kitchen ipratropium-albuterol (DUONEB) 0.5-2.5 (3) MG/3ML SOLN Take 3 mLs by nebulization every 4 (four) hours as needed.  Marland Kitchen levothyroxine (SYNTHROID) 175 MCG tablet Take 1 tablet (175 mcg total) by mouth daily before breakfast.  . Melatonin 10 MG CAPS Take 10 mg by mouth daily as needed (for sleep).  . memantine (NAMENDA XR) 28 MG CP24 24 hr capsule Take 1 capsule (28 mg total) by mouth daily.  . NON FORMULARY Diet Type:  Regular NAS. Heart Healthy  . Tiotropium Bromide Monohydrate (SPIRIVA RESPIMAT) 2.5 MCG/ACT AERS Inhale 2 puffs into the lungs daily.  Marland Kitchen escitalopram (LEXAPRO) 10 MG tablet Take 1 tablet by mouth daily : DX F41.9, F32.9 (Patient not taking: Reported on 04/30/2019)  . [DISCONTINUED] carvedilol (COREG) 25 MG tablet Take 1 tablet  (25 mg total) by mouth 2 (two) times daily.   No facility-administered encounter medications on file as of 05/21/2019.     Review of Systems  Constitutional: Negative for appetite change, chills, fatigue and fever.  HENT: Negative for congestion, rhinorrhea, sinus pressure, sinus pain and sore throat.   Respiratory: Negative for cough, chest tightness and wheezing.        Chronic shortness of breath from COPD on 3 Liters [place]   Cardiovascular: Negative for chest pain, palpitations and leg swelling.  Gastrointestinal: Negative for abdominal distention, abdominal pain, constipation, diarrhea, nausea and vomiting.  Genitourinary: Negative for decreased urine volume, difficulty urinating, dysuria, flank pain, frequency and urgency.  Musculoskeletal: Positive for arthralgias, back pain and gait problem.       Up with walker   Skin:       Status post circumcision   Neurological: Negative for dizziness and headaches.  Psychiatric/Behavioral: Negative for behavioral problems and sleep disturbance. The patient is not nervous/anxious.     Immunization History  Administered Date(s) Administered  . Influenza, High Dose Seasonal PF 05/29/2013, 06/09/2016, 05/05/2018  . Influenza-Unspecified 04/30/2014, 08/15/2014  . Pneumococcal Conjugate-13 08/15/2014  . Pneumococcal Polysaccharide-23 04/29/2010   Pertinent  Health Maintenance Due  Topic Date Due  . INFLUENZA VACCINE  03/31/2019  . PNA vac Low Risk Adult  Completed   Fall Risk  05/21/2019 04/30/2019 03/05/2019 09/29/2018 07/12/2018  Falls in the past year? 0 0 1 1 0  Comment - - - - -  Number falls in past yr: 0 0 1 0 -  Comment - - - - -  Injury with Fall? 0 0 0 0 -  Risk Factor Category  - - - - -  Risk for fall due to : - - - - -  Risk for fall due to: Comment - - - - -  Follow up - - - - -  Comment - - - - -   There were no vitals filed for this visit. There is no height or weight on file to calculate BMI. Physical  Exam Constitutional:      General: He is not in acute distress.    Appearance: He is obese. He is not ill-appearing.  HENT:     Mouth/Throat:     Mouth: Mucous membranes are moist.     Pharynx: Oropharynx is clear.  Eyes:  General: No scleral icterus.       Right eye: No discharge.        Left eye: No discharge.     Conjunctiva/sclera: Conjunctivae normal.  Pulmonary:     Effort: Pulmonary effort is normal. No respiratory distress.     Comments: Oxygen via nasal cannula in place  Genitourinary:    Penis: Circumcised.      Comments: Circumcision incision site healing well no redness or drainage noted.  Musculoskeletal:     Comments: Gait unsteady ambulates with a walker.right leg chronic edema.   Neurological:     Mental Status: He is alert and oriented to person, place, and time.     Gait: Gait abnormal.  Psychiatric:        Mood and Affect: Mood normal.        Behavior: Behavior normal.        Thought Content: Thought content normal.        Judgment: Judgment normal.     Labs reviewed: Recent Labs    10/16/18 0831 11/14/18 1404 02/03/19 1940 05/11/19  NA 143 141 137 139  K 4.4 5.1 4.4 4.3  CL 108 106 105  --   CO2 _0 --   GLUCOSE 104* 97 98  --   BUN _1 CREATININE 1.51* 1.69* 1.74* 1.7*  CALCIUM 8.9 8.7 8.6*  --   MG  --   --  1.9  --    Recent Labs    09/04/18 0353  10/16/18 0831 11/14/18 1404 02/03/19 1940 05/11/19  AST 149*   < > _2 ALT 58*   < > 43 _3 ALKPHOS 160*  --   --   --  111 121  BILITOT 1.2   < > 1.0 0.6 1.2  --   PROT 6.3*   < > 6.5 6.5 7.0  --   ALBUMIN 2.9*  --   --   --  3.0*  --    < > = values in this interval not displayed.   Recent Labs    07/12/18 1002 09/04/18 0105 09/29/18 1059 02/03/19 1940 05/11/19  WBC 5.4 6.8 4.4 5.0 5.1  NEUTROABS 3,737  --  2,768  --  3  HGB 9.8* 11.5* 11.7* 10.3* 10.1*  HCT 29.9* 38.8* 35.8* 33.6* 34*  MCV 94.0 94.9 88.2 94.9  --   PLT 176 181 175 159 199    Lab Results  Component Value Date   TSH 0.20 (A) 05/11/2019   Lab Results  Component Value Date   HGBA1C 5.7 (H) 01/28/2018   Lab Results  Component Value Date   CHOL 126 11/14/2018   HDL 28 (L) 11/14/2018   LDLCALC 75 11/14/2018   TRIG 147 11/14/2018   CHOLHDL 4.5 11/14/2018    Significant Diagnostic Results in last 30 days:  No results found.  Assessment/Plan 1. Hypotension, unspecified hypotension  Reports B/p 140/90 today.denies any dizziness or faint feeling.on Fludrocortisone 0.1 mg tablet one by mouth daily.Encouraged to check blood pressure daily and Hold Fludrocortisone 0.1 mg tablet one by mouth daily if B/p > 140/90.follow up with Cardiologist [facility] to continue to monitor blood pressure.   2. S/P routine circumcision Incision site healing well.reports some burning when urine touches incision site.Son applies triple antibiotic ointment daily.Advised to monitor for any redness,swelling or drainage from incision site and notify provider. Home health Nurse to continue to monitor  incision.  3. COPD (chronic obstructive pulmonary disease) with chronic bronchitis (HCC) Breathing is stable.continue on oxygen via nasal cannula,DuoNeb 0.5-2.5(3)mg/72m soln inhale 3 ml every 4 hours as needed and Spiriva 2 puffs daily.    4. Hypothyroidism due to acquired atrophy of thyroid Lab Results  Component Value Date   TSH 0.20 (A) 05/11/2019  Decrease levothyroxine from 175 mcg tablet daily to 150 mcg tablet daily.Recheck TSH level in 8 weeks.  5. Unsteady gait Uses walker to ambulate.continue with Physical therapy for gait stability. Fall and safety precautions.   6. Generalized weakness Has improved since hospital discharge.Physical Therapy to continue for muscle strengthening.   Family/ staff Communication: Reviewed plan of care with patient and patient's son.  Labs/tests ordered: TSH level in 8 weeks.   Spent 11 minutes of face to face on Video  call  with patient and son.   Next visit: 06/06/2019 with JSherrie Mustachefor medical management of chronic issues DSandrea Hughs NP

## 2019-06-05 ENCOUNTER — Ambulatory Visit: Payer: Medicare Other | Admitting: Nurse Practitioner

## 2019-06-06 ENCOUNTER — Ambulatory Visit (INDEPENDENT_AMBULATORY_CARE_PROVIDER_SITE_OTHER): Payer: Medicare Other | Admitting: Nurse Practitioner

## 2019-06-06 ENCOUNTER — Encounter: Payer: Self-pay | Admitting: Nurse Practitioner

## 2019-06-06 ENCOUNTER — Other Ambulatory Visit: Payer: Self-pay

## 2019-06-06 DIAGNOSIS — I4821 Permanent atrial fibrillation: Secondary | ICD-10-CM | POA: Diagnosis not present

## 2019-06-06 DIAGNOSIS — F329 Major depressive disorder, single episode, unspecified: Secondary | ICD-10-CM

## 2019-06-06 DIAGNOSIS — I2510 Atherosclerotic heart disease of native coronary artery without angina pectoris: Secondary | ICD-10-CM

## 2019-06-06 DIAGNOSIS — G301 Alzheimer's disease with late onset: Secondary | ICD-10-CM

## 2019-06-06 DIAGNOSIS — I959 Hypotension, unspecified: Secondary | ICD-10-CM

## 2019-06-06 DIAGNOSIS — F419 Anxiety disorder, unspecified: Secondary | ICD-10-CM

## 2019-06-06 DIAGNOSIS — R531 Weakness: Secondary | ICD-10-CM | POA: Diagnosis not present

## 2019-06-06 DIAGNOSIS — D508 Other iron deficiency anemias: Secondary | ICD-10-CM

## 2019-06-06 DIAGNOSIS — F0281 Dementia in other diseases classified elsewhere with behavioral disturbance: Secondary | ICD-10-CM

## 2019-06-06 DIAGNOSIS — I10 Essential (primary) hypertension: Secondary | ICD-10-CM

## 2019-06-06 DIAGNOSIS — N183 Chronic kidney disease, stage 3 unspecified: Secondary | ICD-10-CM

## 2019-06-06 DIAGNOSIS — J449 Chronic obstructive pulmonary disease, unspecified: Secondary | ICD-10-CM

## 2019-06-06 DIAGNOSIS — E034 Atrophy of thyroid (acquired): Secondary | ICD-10-CM

## 2019-06-06 NOTE — Progress Notes (Signed)
This service is provided via telemedicine  No vital signs collected/recorded due to the encounter was a telemedicine visit.   Location of patient (ex: home, work): Home  Patient consents to a telephone visit:  Yes  Location of the provider (ex: office, home): [facility], Office   Name of any referring provider:  N/A  Names of all persons participating in the telemedicine service and their role in the encounter:  S.Chrae B/CMA, Sherrie Mustache, NP, [name] (patients son) and Patient   Time spent on call: 8 min with medical assistant       Careteam: Patient Care Team: Lauree Chandler, NP as PCP - General (Geriatric Medicine) Nahser, [name], MD as PCP - Cardiology (Cardiology) Nahser, [name], MD as Consulting Physician (Cardiology) [name], MD as Referring Physician (Urology)  Advanced Directive information Does Patient Have a Medical Advance Directive?: Yes, Type of Advance Directive: Out of facility DNR (pink MOST or yellow form), Does patient want to make changes to medical advance directive?: No - Patient declined  Allergies  Allergen Reactions  . Haldol [Haloperidol Lactate]     Over-sedation and at times agitation     Chief Complaint  Patient presents with  . Medical Management of Chronic Issues    3 month follow-up. Virtual visit  . Immunizations    Flu vaccine due   . Advanced Directive    Reactivate DNR      HPI: Patient is a 85 y.o. male seen via virtual visit for routine follow up.  Pt has followed up with Dinah Np in our office after hospitalization and rehab stays for weakness, endocarditis and hypotension over the last several months.  [place] Pt with hx of dementia, hypotension, Permanent Afib,bioprosthetic aortic valve endocarditis, COPD 3L o2 dependant, OSA, CKD, and others.   A fib/hypotension/aortic valve endocarditis-  following with cardiologist [facility] after  his endocarditis.   there has been a lot of modifications to blood pressure medication weight loss and more active now- 130/82 Bp this morning. PT/OT and nurse monitoring. No more hypotension that they are aware of. Rate controlled on coreg, he has been taken off all anticoagulation of a fib- had significant anemia noted in ED with positive hema stools- he was given 2 units PRBC. Endoscopy and colonoscopy was placed on hold due to endocarditis.  He has now completed all treatment for endocarditis and doing well from that standpoint.   COPD- stable,  son questions if they can lower O2, he is on 3 and o2 sats at 98-99% continues on spiriva daily. Has not needed nebulizer.   He has lost 100 lbs in a year to 14 months.   Dementia- he had significant decline but has "rebounded" per son. Reports he has help in the home. PT/OT involved now.    Gait instability- swelling in leg made it hard for him to ambulate, this has now improved, has PT/OT.   Neuropathy- effecting his gait. Still able to walk just being more cautious due to this. States  Feels like it is asleep but not significantly painful.   Insomnia- doing well on 10 mg melatonin   Anxiety/depression- reports a little bit of depression- he is not taking lexapro, son states he can restart medication.   Hypothyroid- was getting too much thyroid, TSH low, therefore synthroid was recently reduced to 150 mcg.    Review of Systems:  Review of Systems  Constitutional:  Positive for weight loss. Negative for chills, fever and malaise/fatigue.  HENT: Negative for congestion, ear discharge, ear pain and sinus pain.   Eyes: Negative for pain and discharge.  Respiratory: Negative for cough, shortness of breath and wheezing.   Cardiovascular: Negative for chest pain, palpitations, orthopnea, claudication and leg swelling.       Right leg swelling  Gastrointestinal: Negative for abdominal pain, constipation, diarrhea, heartburn, nausea and vomiting.   Genitourinary: Negative for flank pain, frequency, hematuria and urgency.  Musculoskeletal: Negative for joint pain and myalgias.  Skin: Negative for itching and rash.  Neurological: Positive for weakness (improving). Negative for dizziness and headaches.  Psychiatric/Behavioral: Positive for depression. The patient is nervous/anxious. The patient does not have insomnia.     Past Medical History:  Diagnosis Date  . Anxiety   . Aortic valve insufficiency   . Arthritis    "wee bit; knees, elbows" (10/15/2014)  . Atrial fibrillation ([place])   . Cardiomyopathy ([place])   . Chronic kidney disease    "down to ~ 1/2 of their regular use; I see kidney dr. in [place]" (10/15/2014)  . Complication of anesthesia    unsure of complications but pt. states there were complications  . COPD (chronic obstructive pulmonary disease) ([place])    on home o2  . Coronary artery disease   . Decreased testosterone level   . Depression   . Difficult intubation    difficult intubation 12/03/09 and 03/27/10 ([facility]); glidescope used 03/27/10  . DVT (deep venous thrombosis) ([place]) ~ 2013   "BLE"  . Dysrhythmia    A. Fib  . Emphysema of lung ([place])   . Enlarged prostate   . Falls frequently    > 20 times in the last year/notes 10/15/2014  . GERD (gastroesophageal reflux disease)   . Heart murmur   . History of blood transfusion 1960   "lots; related to an accident"  . History of echocardiogram    post AVR >> Echo 7/16:  Mild LVH, EF 20-25%, AVR ok (peak 15 mmHg, mean 9 mmHg), MAC, mild MR, severe LAE, mild reduced RVSF, mild RAE, PASP 35 mmHg  . Hypercholesteremia   . Hypertension   . Hypothyroidism   . OSA (obstructive sleep apnea)    "suppose to wear mask; I throw it off in my sleep" (10/15/2014)  . Plantar fasciitis   . Right fibular fracture 10/15/2014  . Seasonal allergies   . Shortness of breath dyspnea   . Syncope 10/15/2014  . Syncope and collapse "several times"  . Urine frequency   . Urine  incontinence   . Varicose veins    Past Surgical History:  Procedure Laterality Date  . ABDOMINAL AORTIC ANEURYSM REPAIR  01/2006; 03/10/2006   Archie Endo 01/12/2011  . AORTIC VALVE REPLACEMENT N/A 02/11/2015   Procedure: AORTIC VALVE REPLACEMENT (AVR);  Surgeon: Grace Isaac, MD;  Location: [place];  Service: Open Heart Surgery;  Laterality: N/A;  . BRAIN SURGERY  1960 X 4   "S/P got my skull busted"; pt. states removed internal carotid artery  . CARDIAC CATHETERIZATION  1990's X 1; 09/2014   no stents  . CIRCUMCISION  05/2019  . CLIPPING OF ATRIAL APPENDAGE N/A 02/11/2015   Procedure: CLIPPING OF ATRIAL APPENDAGE;  Surgeon: Grace Isaac, MD;  Location: [place];  Service: Open Heart Surgery;  Laterality: N/A;  . ERCP  11/2009   Archie Endo 12/05/2009  . gall stone    . HERNIA REPAIR    . LAPAROSCOPIC CHOLECYSTECTOMY  08/2009   w/IOC/notes 09/18/2009  . LAPAROSCOPIC INCISIONAL / UMBILICAL / VENTRAL HERNIA REPAIR  02/2010   VHR w/mesh/notes 03/28/2010  . NOSE SURGERY    . TEE WITHOUT CARDIOVERSION N/A 02/11/2015   Procedure: TRANSESOPHAGEAL ECHOCARDIOGRAM (TEE);  Surgeon: Grace Isaac, MD;  Location: [place];  Service: Open Heart Surgery;  Laterality: N/A;  . TONSILLECTOMY     Social History:   reports that he quit smoking about 45 years ago. His smoking use included cigarettes. He has a 44.00 pack-year smoking history. He has never used smokeless tobacco. He reports previous alcohol use. He reports that he does not use drugs.  Family History  Problem Relation Age of Onset  . Rheum arthritis Mother   . Diabetes Mother   . Heart disease Father   . Rheum arthritis Father   . Kidney disease Father   . Other Sister        Murdered  . Breast cancer Sister   . Colon cancer Neg Hx   . Colon polyps Neg Hx   . Liver disease Neg Hx     Medications: Patient's Medications  New Prescriptions   No medications on file  Previous Medications   BISACODYL (DULCOLAX PO)    Take 1 capsule by mouth as  needed.   CARVEDILOL (COREG) 6.25 MG TABLET    Take 6.25 mg by mouth daily.   DUTASTERIDE (AVODART) 0.5 MG CAPSULE    Take 1 capsule (0.5 mg total) by mouth daily.   ESCITALOPRAM (LEXAPRO) 10 MG TABLET    Take 1 tablet by mouth daily : DX F41.9, F32.9   FIBER PO    Take 2 tablets by mouth daily. 4 gram overthecounter [facility]   IBUPROFEN (ADVIL) 600 MG TABLET    Take 600 mg by mouth every 6 (six) hours as needed.   IPRATROPIUM-ALBUTEROL (DUONEB) 0.5-2.5 (3) MG/3ML SOLN    Take 3 mLs by nebulization every 4 (four) hours as needed.   LEVOTHYROXINE (SYNTHROID) 150 MCG TABLET    Take 1 tablet (150 mcg total) by mouth daily before breakfast.   MELATONIN 10 MG CAPS    Take 10 mg by mouth daily as needed (for sleep).   MEMANTINE (NAMENDA XR) 28 MG CP24 24 HR CAPSULE    Take 1 capsule (28 mg total) by mouth daily.   NON FORMULARY    Diet Type:  Regular NAS. Heart Healthy   TIOTROPIUM BROMIDE MONOHYDRATE (SPIRIVA RESPIMAT) 2.5 MCG/ACT AERS    Inhale 2 puffs into the lungs daily.  Modified Medications   No medications on file  Discontinued Medications   FLUDROCORTISONE (FLORINEF) 0.1 MG TABLET    Take 0.1 mg by mouth daily.    Physical Exam:  There were no vitals filed for this visit. There is no height or weight on file to calculate BMI. Wt Readings from Last 3 Encounters:  02/28/19 225 lb 3.2 oz (102.2 kg)  02/07/19 240 lb (108.9 kg)  02/03/19 240 lb (108.9 kg)    Labs reviewed: Basic Metabolic Panel: Recent Labs    09/29/18 1103 10/16/18 0831 11/14/18 1404 02/03/19 1940 05/11/19  NA  --  143 141 137 139  K  --  4.4 5.1 4.4 4.3  CL  --  108 106 105  --   CO2  --  _0 --   GLUCOSE  --  104* 97 98  --   BUN  --  _1 CREATININE  --  1.51*  1.69* 1.74* 1.7*  CALCIUM  --  8.9 8.7 8.6*  --   MG  --   --   --  1.9  --   TSH 0.10*  --  7.15*  --  0.20*   Liver Function Tests: Recent Labs    09/04/18 0353  10/16/18 0831 11/14/18 1404 02/03/19 1940 05/11/19  AST 149*   <  > _0 ALT 58*   < > 43 _1 ALKPHOS 160*  --   --   --  111 121  BILITOT 1.2   < > 1.0 0.6 1.2  --   PROT 6.3*   < > 6.5 6.5 7.0  --   ALBUMIN 2.9*  --   --   --  3.0*  --    < > = values in this interval not displayed.   Recent Labs    09/04/18 0353 02/03/19 1940  LIPASE 32 30   No results for input(s): AMMONIA in the last 8760 hours. CBC: Recent Labs    07/12/18 1002 09/04/18 0105 09/29/18 1059 02/03/19 1940 05/11/19  WBC 5.4 6.8 4.4 5.0 5.1  NEUTROABS 3,737  --  2,768  --  3  HGB 9.8* 11.5* 11.7* 10.3* 10.1*  HCT 29.9* 38.8* 35.8* 33.6* 34*  MCV 94.0 94.9 88.2 94.9  --   PLT 176 181 175 159 199   Lipid Panel: Recent Labs    07/12/18 1002 11/14/18 1404  CHOL 87 126  HDL 28* 28*  LDLCALC 41 75  TRIG 92 147  CHOLHDL 3.1 4.5   TSH: Recent Labs    09/29/18 1103 11/14/18 1404 05/11/19  TSH 0.10* 7.15* 0.20*   A1C: Lab Results  Component Value Date   HGBA1C 5.7 (H) 01/28/2018     Assessment/Plan .1. Coronary artery disease involving native coronary artery of native heart without angina pectoris Stable, without chest pain. Continues to follow with cardiology at this time.  2. Generalized weakness Significant improvement in weakness. Working with home health PT/OT.   3. Late onset Alzheimer's disease with behavioral disturbance ([place]) Stable son states he is doing much better. Functional and cognitive status has actually improved per son. Continues with PT/OT therapies. Continues on namenda daily   4. Permanent atrial fibrillation -appears to be rate controlled, continues to follow up with cardiology, do not have any of these notes, will need son/pt to sign record release at next OV to review. Not currently on anticoagulation due to hem positive stool.   5. Hypotension, unspecified hypotension type Has improved at this time.  6. COPD (chronic obstructive pulmonary disease) with chronic bronchitis (HCC) Stable, currently on 3L but appears  this can be titrated down., encouraged to keep O2 sats over 90%. Continues on spiriva routinely  7. Hypothyroidism due to acquired atrophy of thyroid Recent reduction in synthroid due to low TSH, will need follow up TSH in 8 weeks, son plans to ask cardiologist if he will draw labs.   8. Other iron deficiency anemia Stable at this time.   9. Essential hypertension Stable, continues on coreg   10. Stage 3 chronic kidney disease, unspecified whether stage 3a or 3b CKD -Encourage proper hydration and to avoid NSAIDS (Aleve, Advil, Motrin, Ibuprofen)   11. Anxiety and depression Reports some worsening of depression, has not been taking lexapro but son plans to restart.   Next appt: 4 months  K. [place], [facility] [phone]  Virtual Visit via Video Note  I connected with Keron Neenan and son on 06/06/19 at  2:45 PM EDT by a video enabled telemedicine application and verified that I am speaking with the correct person using two identifiers.  Location: Patient: home Provider: office    I discussed the limitations of evaluation and management by telemedicine and the availability of in person appointments. The patient expressed understanding and agreed to proceed.    I discussed the assessment and treatment plan with the patient. The patient was provided an opportunity to ask questions and all were answered. The patient agreed with the plan and demonstrated an understanding of the instructions.   The patient was advised to call back or seek an in-person evaluation if the symptoms worsen or if the condition fails to improve as anticipated.  I provided 22 minutes of non-face-to-face time during this encounter.  Carlos American. Dewaine Oats, AGNP Avs printed and mailed.

## 2019-06-07 NOTE — Patient Instructions (Signed)
Please have cardiologist send [place] your records for review.   Will need TSH in 8 weeks.

## 2019-06-13 ENCOUNTER — Telehealth: Payer: Self-pay

## 2019-06-13 NOTE — Telephone Encounter (Signed)
Jeani Hawking with [employer] called to inform Lauree Chandler, NP that for the past 2 days patient c/o dizziness and low BP x 1 episode  B/P today was 102/68 B/P yesterday 130/68  Jeani Hawking thinks patient is not drinking adequate amounts of water. Jeani Hawking offered patient water and he drank 3 cups back to back. Jeani Hawking educated the family on the need to keep patient hydrated.  Jeani Hawking also mentioned patient seen cardiology recently and B/P was low and as far as she knows they were not concerned.  Jeani Hawking states they have a protocol to call if B/P low

## 2019-06-13 NOTE — Telephone Encounter (Signed)
Noted, we need records from most recent cardiologist visit as I have not seen these records

## 2019-06-14 NOTE — Telephone Encounter (Signed)
Spoke with patients son and asked that he contact patients cardiologist in [place], [place] and have them fax last office visit notes to our office.

## 2019-06-20 ENCOUNTER — Telehealth: Payer: Self-pay

## 2019-06-20 NOTE — Telephone Encounter (Signed)
Virtual Visit Pre-Appointment Phone Call  "(Name), I am calling you today to discuss your upcoming appointment. We are currently trying to limit exposure to the virus that causes COVID-19 by seeing patients at home rather than in the office."  1. "What is the BEST phone number to call the day of the visit?" - include this in appointment notes  2. "Do you have or have access to (through a family member/friend) a smartphone with video capability that we can use for your visit?" a. If yes - list this number in appt notes as "cell" (if different from BEST phone #) and list the appointment type as a VIDEO visit in appointment notes b. If no - list the appointment type as a PHONE visit in appointment notes  3. Confirm consent - "In the setting of the current Covid19 crisis, you are scheduled for a (phone or video) visit with your provider on (date) at (time).  Just as we do with many in-office visits, in order for you to participate in this visit, we must obtain consent.  If you'd like, I can send this to your mychart (if signed up) or email for you to review.  Otherwise, I can obtain your verbal consent now.  All virtual visits are billed to your insurance company just like a normal visit would be.  By agreeing to a virtual visit, we'd like you to understand that the technology does not allow for your provider to perform an examination, and thus may limit your provider's ability to fully assess your condition. If your provider identifies any concerns that need to be evaluated in person, we will make arrangements to do so.  Finally, though the technology is pretty good, we cannot assure that it will always work on either your or our end, and in the setting of a video visit, we may have to convert it to a phone-only visit.  In either situation, we cannot ensure that we have a secure connection.  Are you willing to proceed?" STAFF: Did the patient verbally acknowledge consent to telehealth visit? Document  YES/NO here: yes  4. Advise patient to be prepared - "Two hours prior to your appointment, go ahead and check your blood pressure, pulse, oxygen saturation, and your weight (if you have the equipment to check those) and write them all down. When your visit starts, your provider will ask you for this information. If you have an Apple Watch or Kardia device, please plan to have heart rate information ready on the day of your appointment. Please have a pen and paper handy nearby the day of the visit as well."  5. Give patient instructions for MyChart download to smartphone OR Doximity/Doxy.me as below if video visit (depending on what platform provider is using)  6. Inform patient they will receive a phone call 15 minutes prior to their appointment time (may be from unknown caller ID) so they should be prepared to answer    TELEPHONE CALL NOTE  [name] [name] has been deemed a candidate for a follow-up tele-health visit to limit community exposure during the Covid-19 pandemic. I spoke with the patient via phone to ensure availability of phone/video source, confirm preferred email & phone number, and discuss instructions and expectations.  I reminded [name] [name] to be prepared with any vital sign and/or heart rhythm information that could potentially be obtained via home monitoring, at the time of his visit. I reminded [name] [name] to expect a phone call prior to his visit.  [name], CMA 06/20/2019 3:10 PM   INSTRUCTIONS FOR DOWNLOADING THE MYCHART APP TO SMARTPHONE  - The patient must first make sure to have activated MyChart and know their login information - If Apple, go to [employer] and type in MyChart in the search bar and download the app. If Android, ask patient to go to [employer] and type in [place] in the search bar and download the app. The app is free but as with any other app downloads, their phone may require them to verify saved payment information or Apple/Android  password.  - The patient will need to then log into the app with their MyChart username and password, and select [facility] as their healthcare provider to link the account. When it is time for your visit, go to the MyChart app, find appointments, and click Begin Video Visit. Be sure to Select Allow for your device to access the Microphone and Camera for your visit. You will then be connected, and your provider will be with you shortly.  **If they have any issues connecting, or need assistance please contact MyChart service desk [phone])**  **If using a computer, in order to ensure the best quality for their visit they will need to use either of the following Internet Browsers: [employer] or Google Chrome**  IF USING DOXIMITY or DOXY.ME - The patient will receive a link just prior to their visit by text.     FULL LENGTH CONSENT FOR TELE-HEALTH VISIT   I hereby voluntarily request, consent and authorize [facility] and its employed or contracted physicians, physician assistants, nurse practitioners or other licensed health care professionals (the Practitioner), to provide me with telemedicine health care services (the "Services") as deemed necessary by the treating Practitioner. I acknowledge and consent to receive the Services by the Practitioner via telemedicine. I understand that the telemedicine visit will involve communicating with the Practitioner through live audiovisual communication technology and the disclosure of certain medical information by electronic transmission. I acknowledge that I have been given the opportunity to request an in-person assessment or other available alternative prior to the telemedicine visit and am voluntarily participating in the telemedicine visit.  I understand that I have the right to withhold or withdraw my consent to the use of telemedicine in the course of my care at any time, without affecting my right to future care or treatment,  and that the Practitioner or I may terminate the telemedicine visit at any time. I understand that I have the right to inspect all information obtained and/or recorded in the course of the telemedicine visit and may receive copies of available information for a reasonable fee.  I understand that some of the potential risks of receiving the Services via telemedicine include:  Marland Kitchen Delay or interruption in medical evaluation due to technological equipment failure or disruption; . Information transmitted may not be sufficient (e.g. poor resolution of images) to allow for appropriate medical decision making by the Practitioner; and/or  . In rare instances, security protocols could fail, causing a breach of personal health information.  Furthermore, I acknowledge that it is my responsibility to provide information about my medical history, conditions and care that is complete and accurate to the best of my ability. I acknowledge that Practitioner's advice, recommendations, and/or decision may be based on factors not within their control, such as incomplete or inaccurate data provided by me or distortions of diagnostic images or specimens that may result from electronic transmissions. I understand that  the practice of medicine is not an exact science and that Practitioner makes no warranties or guarantees regarding treatment outcomes. I acknowledge that I will receive a copy of this consent concurrently upon execution via email to the email address I last provided but may also request a printed copy by calling the office of [place].    I understand that my insurance will be billed for this visit.   I have read or had this consent read to me. . I understand the contents of this consent, which adequately explains the benefits and risks of the Services being provided via telemedicine.  . I have been provided ample opportunity to ask questions regarding this consent and the Services and have had my questions  answered to my satisfaction. . I give my informed consent for the services to be provided through the use of telemedicine in my medical care  By participating in this telemedicine visit I agree to the above.

## 2019-06-21 NOTE — Progress Notes (Signed)
Virtual Visit via Video Note   This visit type was conducted due to national recommendations for restrictions regarding the COVID-19 Pandemic (e.g. social distancing) in an effort to limit this patient's exposure and mitigate transmission in our community.  Due to his co-morbid illnesses, this patient is at least at moderate risk for complications without adequate follow up.  This format is felt to be most appropriate for this patient at this time.  All issues noted in this document were discussed and addressed.  A limited physical exam was performed with this format.  Please refer to the patient's chart for his consent to telehealth for [facility].   Date:  06/21/2019   ID:  [name] [name], DOB 1931/12/28, MRN [id number]  Patient Location: Home Provider Location: Home  PCP:  Lauree Chandler, NP  Cardiologist:  Mertie Moores, MD  Electrophysiologist:  None   Evaluation Performed:  Follow-Up Visit  Chief Complaint: Follow-up, seen for Dr. Acie Fredrickson  History of Present Illness:    [name] [name] is a 85 y.o. male with a hx of aortic insufficiency s/p AVR in 2016, chronic AFib s/p clipping in 2016, CAD with RCA disease, chronic systolic heart failure, HTN, HLD, OSA on CPAP (non compliant), AAA s/p repair in 2007 by Dr. Scot Dock, chronic LBBB, hypothyroidism, COPD, CKD stage III, s/p traumatic brain injury with craniotomy in 1960 and hx ofDVT(2013).  [name] [name] was last seen in hospital consultation 09/04/2018 for the evaluation of chest/epigastric pain. Work-up was negative for ACS with negative cardiac markers and a nonacute EKG. Due to his poor functional capacity, progressive cognitive decline and multiple comorbid medical conditions including CKD he was felt not to be a good candidate for invasive cardiac testing unless he was having acute coronary syndrome with positive markers and typical anginal symptoms.  IV heparin had been started however was discontinued and he was started back on  Eliquis therapy for permanent atrial fibrillation.  He has not been seen by her service since this time.  He previously underwent bioprosthetic AVR and clipping of the left atrial appendage 01/2015.  Bilateral saphenous veins were unsuitable for grafting.  Has 70% distal RCA disease from preoperative cardiac cath. Last echocardiogram 01/2018 showed LVEF of 60 to 65% with normal functioning aortic wall, severe dilated left atrium, mild mitral regurgitation and an ascending aortic diameter of 42 mmHg.  Seen in video telehealth with son's assistance. He has had some difficulty with recent hospitalizations in June and July of this year.  Most recently was admitted to [facility] 01/2019 for anemia and sepsis in which his Eliquis was stopped out of concern for bleeding however no further GI follow-up has been performed.  Has remained off since this time.  He was discharged to [facility] rehab facility after 20 days.  Son has a second home [place] the [place] of [place] and took him there after discharge from rehab.  Unfortunately, he was again admitted to the hospital 02/2019? with sepsis and endocarditis. He was treated and released with long-term IV therapy, home health RN/IV therapy and referral for cardiologist, Dr. Brenton Grills in [facility].  Apparently, a TEE was recommended by the discharging hospitalist however when seen in follow-up by above cardiologist, son reports this was not pursued as it was not felt indicated.  Per son's report, he was cleared from a cardiology standpoint.  We had a long discussion about obtaining medical records from these facilities.  After the above hospitalization, he had an extended rehabilitation  and has since been staying with his son (in [place], [place]) with home health assistance and aids.  He seems to be doing fairly well.  Son is not adamant about pursuing invasive procedures at this point given all of his comorbid conditions as he may not tolerate.  Continues to  wear 3 L continuous oxygen.  Tolerates ambulation without anginal symptoms.  Has had no palpitations, orthopnea, syncope or falls.  Has been having some issues with orthostatic hypotension in which his BP medications were reduced.  Eliquis still not on his medication list for reasons described above.  Son is interested in getting him reestablished with our team here in [place] as this will eventually be his main residence.  We will need to discuss next steps in care with Dr. Acie Fredrickson and review of medical records once obtained.  Discussed the need for possible further imaging for our records and an in person office visit.  Son has been very accepting of this however would like to give him a better timeline given that they are currently staying near [place].  The patient does not have symptoms concerning for COVID-19 infection (fever, chills, cough, or new shortness of breath).   Past Medical History:  Diagnosis Date   Anxiety    Aortic valve insufficiency    Arthritis    "wee bit; knees, elbows" (10/15/2014)   Atrial fibrillation (HCC)    Cardiomyopathy ([place])    Chronic kidney disease    "down to ~ 1/2 of their regular use; I see kidney dr. in [place]" (04/07/9832)   Complication of anesthesia    unsure of complications but pt. states there were complications   COPD (chronic obstructive pulmonary disease) ([place])    on home o2   Coronary artery disease    Decreased testosterone level    Depression    Difficult intubation    difficult intubation 12/03/09 and 03/27/10 ([facility]); glidescope used 03/27/10   DVT (deep venous thrombosis) ([place]) ~ 2013   "BLE"   Dysrhythmia    A. Fib   Emphysema of lung ([place])    Enlarged prostate    Falls frequently    > 20 times in the last year/notes 10/15/2014   GERD (gastroesophageal reflux disease)    Heart murmur    History of blood transfusion 1960   "lots; related to an accident"   History of echocardiogram    post AVR >> Echo 7/16:   Mild LVH, EF 20-25%, AVR ok (peak 15 mmHg, mean 9 mmHg), MAC, mild MR, severe LAE, mild reduced RVSF, mild RAE, PASP 35 mmHg   Hypercholesteremia    Hypertension    Hypothyroidism    OSA (obstructive sleep apnea)    "suppose to wear mask; I throw it off in my sleep" (10/15/2014)   Plantar fasciitis    Right fibular fracture 10/15/2014   Seasonal allergies    Shortness of breath dyspnea    Syncope 10/15/2014   Syncope and collapse "several times"   Urine frequency    Urine incontinence    Varicose veins    Past Surgical History:  Procedure Laterality Date   ABDOMINAL AORTIC ANEURYSM REPAIR  01/2006; 03/10/2006   Archie Endo 01/12/2011   AORTIC VALVE REPLACEMENT N/A 02/11/2015   Procedure: AORTIC VALVE REPLACEMENT (AVR);  Surgeon: Grace Isaac, MD;  Location: [facility];  Service: Open Heart Surgery;  Laterality: N/A;   BRAIN SURGERY  1960 X 4   "S/P got my skull busted"; pt. states removed internal carotid artery  CARDIAC CATHETERIZATION  1990's X 1; 09/2014   no stents   CIRCUMCISION  05/2019   CLIPPING OF ATRIAL APPENDAGE N/A 02/11/2015   Procedure: CLIPPING OF ATRIAL APPENDAGE;  Surgeon: Grace Isaac, MD;  Location: [facility];  Service: Open Heart Surgery;  Laterality: N/A;   ERCP  11/2009   /notes 12/05/2009   gall stone     HERNIA REPAIR     LAPAROSCOPIC CHOLECYSTECTOMY  08/2009   w/IOC/notes 09/18/2009   LAPAROSCOPIC INCISIONAL / UMBILICAL / VENTRAL HERNIA REPAIR  02/2010   VHR w/mesh/notes 03/28/2010   NOSE SURGERY     TEE WITHOUT CARDIOVERSION N/A 02/11/2015   Procedure: TRANSESOPHAGEAL ECHOCARDIOGRAM (TEE);  Surgeon: Grace Isaac, MD;  Location: [facility];  Service: Open Heart Surgery;  Laterality: N/A;   TONSILLECTOMY       No outpatient medications have been marked as taking for the 06/25/19 encounter (Appointment) with Tommie Raymond, NP.     Allergies:   Haldol [haloperidol lactate]   Social History   Tobacco Use   Smoking status: Former  Smoker    Packs/day: 2.00    Years: 22.00    Pack years: 44.00    Types: Cigarettes    Quit date: 04/29/1974    Years since quitting: 45.1   Smokeless tobacco: Never Used  Substance Use Topics   Alcohol use: Not Currently    Alcohol/week: 0.0 standard drinks    Comment: 1 beer per year    Drug use: No     Family Hx: The patient's family history includes Breast cancer in his sister; Diabetes in his mother; Heart disease in his father; Kidney disease in his father; Other in his sister; Rheum arthritis in his father and mother. There is no history of Colon cancer, Colon polyps, or Liver disease.  ROS:   Please see the history of present illness.     All other systems reviewed and are negative.  Prior CV studies:   The following studies were reviewed today:  ECHO: 01/28/2018: Study Conclusions  - Left ventricle: The cavity size was normal. Systolic function was normal. The estimated ejection fraction was in the range of 60% to 65%. Wall motion was normal; there were no regional wall motion abnormalities. Doppler parameters are consistent with high ventricular filling pressure. - Aortic valve: A prosthesis was present and functioning normally. The prosthesis had a normal range of motion. The sewing ring appeared normal, had no rocking motion, and showed no evidence of dehiscence. Peak velocity (S): 254 cm/s. Mean gradient (S): 13 mm Hg. Valve area (VTI): 1.91 cm^2. Valve area (Vmax): 1.8 cm^2. Valve area (Vmean): 1.94 cm^2. - Aorta: Ascending aortic diameter: 42 mm (S). - Ascending aorta: The ascending aorta was mildly dilated. - Mitral valve: There was mild regurgitation. - Left atrium: The atrium was severely dilated. - Right ventricle: The cavity size was moderately dilated. Wall thickness was normal. - Right atrium: The atrium was severely dilated. - Pulmonary arteries: Systolic pressure was mildly increased.  Impressions:  - EF is improved  when compared to prior  Carotid US 11/11/95 RICA 02-63%; LICA absent  LHC 7/85/88 ([place]) LM: Ok LAD: Insignificant LCx: Normal RCA: Dist 70%  Labs/Other Tests and Data Reviewed:    EKG:  No ECG reviewed.  Recent Labs: 02/03/2019: B Natriuretic Peptide 244.6; Magnesium 1.9 05/11/2019: ALT 16; BUN 20; Creatinine 1.7; Hemoglobin 10.1; Platelets 199; Potassium 4.3; Sodium 139; TSH 0.20   Recent Lipid Panel Lab Results  Component Value Date/Time  CHOL 126 11/14/2018 02:04 PM   TRIG 147 11/14/2018 02:04 PM   HDL 28 (L) 11/14/2018 02:04 PM   CHOLHDL 4.5 11/14/2018 02:04 PM   LDLCALC 75 11/14/2018 02:04 PM    Wt Readings from Last 3 Encounters:  02/28/19 225 lb 3.2 oz (102.2 kg)  02/07/19 240 lb (108.9 kg)  02/03/19 240 lb (108.9 kg)     Objective:    Vital Signs:  There were no vitals taken for this visit.   VITAL SIGNS:  reviewed GEN:  no acute distress EYES:  sclerae anicteric, EOMI - Extraocular Movements Intact RESPIRATORY:  normal respiratory effort, symmetric expansion NEURO:  alert and oriented x 3, no obvious focal deficit PSYCH:  normal affect  ASSESSMENT & PLAN:    1.  Chronic diastolic CHF: -Last echocardiogram 01/28/2018 with normal LVEF, normal functioning aortic valve prosthesis with a mean gradient of 13 mmHg.  Ascending aortic diameter 42 mm -Has chronic LE edema mostly from a sedentary lifestyle??? -Previous attempts to increase Lasix advised by other physicians resulted in significant renal insufficiency -Currently on no diuretic therapies  2.  History of AVR 2016: -Recent hospitalization for endocarditis as described above -Awaiting medical records from [facility] as well as [facility] -Was seen in the post hospital setting by cardiologist in [facility], Dr. Drenier?  Who cleared the patient from a cardiology perspective -May need further follow-up imaging from our office and in person visit  3.  Permanent atrial  fibrillation: -Patient with known persistent atrial fibrillation>> was on reduced dose Eliquis 2.5 mg twice daily however this was discontinued after recent episode of significant anemia that required 2 unit PRBC transfusion -Anticoagulations on hold>>  -Denies palpitations -Reduce carvedilol to 3.125 mg once daily  (discharged from hospital on 6.25 mg once daily secondary to hypotension )  -Discussed high risk of stroke with no AC however the risk of bleeding with most recent anemia and the need for blood transfusion -We will get Dr. Elmarie Shiley opinion for restart -CHA2DS2VASc =7 (age, CHF, hypertension, thromboembolism, vascular disease)  4.  Acute on chronic kidney disease stage III: -Last creatinine, 1.7 on 05/11/2019 which appears to be around his baseline of 1.6-1.7 however  5.  History of CAD with 70% distal RCA disease: -Per chart review, known RCA occlusion however unable to find cardiac cath report>> no bypass grafting secondary to lack of acceptable conduit -No anginal symptoms -Continue carvedilol -No ASA secondary to previous Eliquis therapy -If no Eliquis restart, consider starting ASA 81 daily given CAD history  6.  AAA: -Status post repair, followed by Dr. Scot Dock with [facility] -Echocardiogram from 01/28/2018, stable -Awaiting imaging from hospitals as described above  7.  COPD, 3L O2 dependent: -followed by Dr. [place] Bells -On 3 L nasal cannula -Continues with Spiriva  8.  HTN: -Stable, 118/88 -Previous antihypertensives decreased/discontinued secondary to hypotension during hospitalization -We will further decrease carvedilol to 3.125 mg  9.  HLD: -Last LDL, 75 on 11/14/2018 -Not on a statin?  10.  Dementia: -More stable -Lives with son currently, receives home health and nursing aide assistance -On Namenda   COVID-19 Education: The signs and symptoms of COVID-19 were discussed with the patient and how to seek care for testing (follow up with PCP or arrange E-visit).  The importance of social distancing was discussed today.  Time:   Today, I have spent 20 minutes with the patient with telehealth technology discussing the above problems.     Medication Adjustments/Labs and Tests  Ordered: Current medicines are reviewed at length with the patient today.  Concerns regarding medicines are outlined above.   Tests Ordered: No orders of the defined types were placed in this encounter.   Medication Changes: No orders of the defined types were placed in this encounter.   Follow Up:  In Person Dr. Acie Fredrickson in 1 month  Signed, Kathyrn Drown, NP  06/21/2019 10:18 AM    [place]

## 2019-06-25 ENCOUNTER — Telehealth: Payer: Self-pay | Admitting: *Deleted

## 2019-06-25 ENCOUNTER — Other Ambulatory Visit: Payer: Self-pay

## 2019-06-25 ENCOUNTER — Telehealth (INDEPENDENT_AMBULATORY_CARE_PROVIDER_SITE_OTHER): Payer: Medicare Other | Admitting: Cardiology

## 2019-06-25 VITALS — BP 118/85 | HR 73 | Ht 72.0 in | Wt 200.0 lb

## 2019-06-25 DIAGNOSIS — J449 Chronic obstructive pulmonary disease, unspecified: Secondary | ICD-10-CM

## 2019-06-25 DIAGNOSIS — I714 Abdominal aortic aneurysm, without rupture, unspecified: Secondary | ICD-10-CM

## 2019-06-25 DIAGNOSIS — I5032 Chronic diastolic (congestive) heart failure: Secondary | ICD-10-CM

## 2019-06-25 DIAGNOSIS — I2510 Atherosclerotic heart disease of native coronary artery without angina pectoris: Secondary | ICD-10-CM | POA: Diagnosis not present

## 2019-06-25 DIAGNOSIS — I330 Acute and subacute infective endocarditis: Secondary | ICD-10-CM

## 2019-06-25 DIAGNOSIS — I482 Chronic atrial fibrillation, unspecified: Secondary | ICD-10-CM | POA: Diagnosis not present

## 2019-06-25 DIAGNOSIS — I38 Endocarditis, valve unspecified: Secondary | ICD-10-CM | POA: Insufficient documentation

## 2019-06-25 MED ORDER — CARVEDILOL 3.125 MG PO TABS: 3.1250 mg | ORAL_TABLET | Freq: Every day | ORAL | 2 refills | Status: DC

## 2019-06-25 NOTE — Telephone Encounter (Signed)
Sounds like he needs a psychiatrist evaluation. Would stop the new medication if he is having suicidal thoughts since he started

## 2019-06-25 NOTE — Patient Instructions (Signed)
Medication Instructions:  Your physician has recommended you make the following change in your medication: Decrease Coreg to 3.125 mg by mouth daily,   *If you need a refill on your cardiac medications before your next appointment, please call your pharmacy*  Lab Work: none If you have labs (blood work) drawn today and your tests are completely normal, you will receive your results only by: Marland Kitchen MyChart Message (if you have MyChart) OR . A paper copy in the mail If you have any lab test that is abnormal or we need to change your treatment, we will call you to review the results.  Testing/Procedures: none  Follow-Up: At [facility], you and your health needs are our priority.  As part of our continuing mission to provide you with exceptional heart care, we have created designated Provider Care Teams.  These Care Teams include your primary Cardiologist (physician) and Advanced Practice Providers (APPs -  Physician Assistants and Nurse Practitioners) who all work together to provide you with the care you need, when you need it.  To be arranged after talking with Dr Acie Fredrickson.

## 2019-06-25 NOTE — Telephone Encounter (Signed)
[name] [name] with [facility] called and left message on Clinical intake stating that she just wanted to let you know that patient at visit today made some suicidal Statements, Not the first time this has happened. Stated that she did call family to have come sit with him and stated that his son told her he thought he was on a new depression medication.

## 2019-06-26 NOTE — Telephone Encounter (Signed)
[name] [name] with [facility] Notified and agreed. Stated that she will let the family know.

## 2019-07-04 ENCOUNTER — Telehealth: Payer: Self-pay | Admitting: Cardiovascular Disease

## 2019-07-04 NOTE — Telephone Encounter (Signed)
Medical records requested from [facility] and [facility]. 07/04/19 vlm

## 2019-07-06 ENCOUNTER — Telehealth: Payer: Self-pay | Admitting: Cardiovascular Disease

## 2019-07-06 NOTE — Telephone Encounter (Signed)
Medical records received from [facility] in box for Dr. Acie Fredrickson to review. 07/06/19 vlm

## 2019-07-24 ENCOUNTER — Ambulatory Visit: Payer: Medicare Other | Admitting: Cardiovascular Disease

## 2019-08-14 ENCOUNTER — Other Ambulatory Visit: Payer: Self-pay | Admitting: Family

## 2019-09-10 ENCOUNTER — Encounter: Payer: Self-pay | Admitting: Cardiovascular Disease

## 2019-09-10 ENCOUNTER — Other Ambulatory Visit: Payer: Self-pay

## 2019-09-10 ENCOUNTER — Telehealth (INDEPENDENT_AMBULATORY_CARE_PROVIDER_SITE_OTHER): Payer: Medicare PPO | Admitting: Cardiovascular Disease

## 2019-09-10 VITALS — BP 131/84 | HR 77 | Ht 72.0 in | Wt 202.0 lb

## 2019-09-10 DIAGNOSIS — I330 Acute and subacute infective endocarditis: Secondary | ICD-10-CM

## 2019-09-10 DIAGNOSIS — I482 Chronic atrial fibrillation, unspecified: Secondary | ICD-10-CM | POA: Diagnosis not present

## 2019-09-10 DIAGNOSIS — I2510 Atherosclerotic heart disease of native coronary artery without angina pectoris: Secondary | ICD-10-CM

## 2019-09-10 DIAGNOSIS — I10 Essential (primary) hypertension: Secondary | ICD-10-CM | POA: Diagnosis not present

## 2019-09-10 DIAGNOSIS — I351 Nonrheumatic aortic (valve) insufficiency: Secondary | ICD-10-CM

## 2019-09-10 DIAGNOSIS — Z7189 Other specified counseling: Secondary | ICD-10-CM

## 2019-09-10 NOTE — Patient Instructions (Signed)
Medication Instructions:  Your physician recommends that you continue on your current medications as directed. Please refer to the Current Medication list given to you today.  *If you need a refill on your cardiac medications before your next appointment, please call your pharmacy*  Lab Work: Lab work to be done at office visit in 6 months--BMP and CBC If you have labs (blood work) drawn today and your tests are completely normal, you will receive your results only by: Marland Kitchen MyChart Message (if you have MyChart) OR . A paper copy in the mail If you have any lab test that is abnormal or we need to change your treatment, we will call you to review the results.  Testing/Procedures: None  Follow-Up: At [facility], you and your health needs are our priority.  As part of our continuing mission to provide you with exceptional heart care, we have created designated Provider Care Teams.  These Care Teams include your primary Cardiologist (physician) and Advanced Practice Providers (APPs -  Physician Assistants and Nurse Practitioners) who all work together to provide you with the care you need, when you need it.  Your next appointment:   6 month(s)  The format for your next appointment:   In Person  Provider:   Georgie Chard, NP  Other Instructions

## 2019-09-10 NOTE — Progress Notes (Signed)
Virtual Visit via Video Note   This visit type was conducted due to national recommendations for restrictions regarding the COVID-19 Pandemic (e.g. social distancing) in an effort to limit this patient's exposure and mitigate transmission in our community.  Due to his co-morbid illnesses, this patient is at least at moderate risk for complications without adequate follow up.  This format is felt to be most appropriate for this patient at this time.  All issues noted in this document were discussed and addressed.  A limited physical exam was performed with this format.  Please refer to the patient's chart for his consent to telehealth for [facility].   Date:  09/10/2019   ID:  [name] [name], DOB 08/08/1932, MRN [id number]  Patient Location: Home Provider Location: Home  PCP:  Lauree Chandler, NP  Cardiologist:  Mertie Moores, MD  Electrophysiologist:  None   Evaluation Performed:  Follow-Up Visit  Chief Complaint:  Atrial fib   Previous Notes  Problem List: 1. Aortic insufficiency- he has severe AI.  - s/p AVR   2. Hyperlipidemia  3. HTN-  4. Obstructive sleep apnea 5. AAA - 2007 , chris Scot Dock 6. Hypothyroidism 7. COPD  8. Atrial fib:  -      [name] [name] is a 86 y.o. male who presents for evaluation for possible aortic valve replacement . He has been followed by the cardiologist at [facility] for years.   He wants to have his AVR here. Has chronic shortness of breath.  Gets worn out taking a load of laundry up the stairs.  Has had recent echos at [facility].  (have been incorporated in our system )   His overall condition has declined over the past 6 months.  Has some balance issues.  Has orthostatic hypotension.   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   Can walk 1/4 mile  Can climb 2 flights of stairs.  No PND or orthopnea.   No syncope.    February 05, 2015: He has  been seen by Dr. Servando Snare.  The plan is to consider AVR with possible MAZE  next week. He had an echo and cath at [facility] in January   Sept. 8, 2016:  Has had AVR  Doing ok  Oct. 6, 2016:  Pt is s/p AVR,  Had CABG. , atrial fib Persistently elevated BP     Occasionally eats ham and cheese for lunch. Eats frozen dinners for dinner almost every night.    Eats at [facility] if he does not eat the [employer]. Also goes to [employer] once a week.   Has some chest wall pain   November 03, 2015  Still very weak. Has DOE with any exertion,  We have tried lasix but that did not seem to help  Also has orthostatic hypotension .   Just came from the neurologist office.   Will be doing PT to try to help with this unsteadiness.   Has been watching his salt.   Jan 21, 2017:  [name] is seen back for follow up visit Has gained some weight .   Has been trying to watch his diet. Is not able to exercise .   Is hardly able to walk due to DOE  No CP .   Has COPD  ( by report)  ,  Atrial fib ,  LV function is normal   March 17, 2018 Hx of AVR in 2016.  Also has hypertension, atrial fibrillation and COPD. Since I last say him, has home O2 for his COPD Now uses a wheelchair. Also developed a leg infection   Has persistent atrial fib.    He has taken Eliquis 5. Mg  BID but it was changed to 2.5 BID in 2015  ( Creatinine of 1.65, age > 56 )  Uses a walker around his house.   He was in the hospital May 31 for orthostatic hypotension and leg infection Has marked right leg edema   .   R>>L    Venous duplex scan on June 1 were negative for DVT. Discussed the [place] Dr. Bertram Gala Rest  [place]. 11, 2021   [name] [name] is a 86 y.o. male with History of aortic insufficiency, status post AVR in 2016, chronic atrial fibrillation,, coronary artery disease, chronic systolic congestive heart failure, hypertension, hyperlipidemia, obstructive sleep apnea on CPAP (noncompliant).  He status post abdominal aortic  aneurysm repair in 2007 by Dr. Scot Dock.  He was last seen in our office by Kathyrn Drown, nurse practitioner by video chat in October, 2020.  He was admitted to the hospital in January, 2020 with chest pain.  His work-up was negative for ACS with negative cardiac markers and a nonacute EKG.  Due to his poor functional capacity, progressive cognitive decline and multiple comorbid medical conditions he was felt not to be a good candidate for invasive cardiac testing.  He has a history of chronic atrial fibrillation.  He has a history of left atrial appendage clipping at the time of his AVR.  He was admitted to [facility] in June, 2020 for anemia and sepsis.  The Eliquis was stopped during that hospitalization and he has remained off Eliquis since that time.  The patient has been seen by cardiologist in [place], [place].  There was some concern that he may have endocarditis.  A TEE was recommended but this has not been done because of the patient's overall poor health.  The patient's son has been helping with his medical issues.  Seen with his son today .  Son agrees with not pursuing TEE at this point .    The patient does not have symptoms concerning for COVID-19 infection (fever, chills, cough, or new shortness of breath).    Past Medical History:  Diagnosis Date  . Anxiety   . Aortic valve insufficiency   . Arthritis    "wee bit; knees, elbows" (10/15/2014)  . Atrial fibrillation ([place])   . Cardiomyopathy ([place])   . Chronic kidney disease    "down to ~ 1/2 of their regular use; I see kidney dr. in [place]" (10/15/2014)  . Complication of anesthesia    unsure of complications but pt. states there were complications  . COPD (chronic obstructive pulmonary disease) ([place])    on home o2  . Coronary artery disease   . Decreased testosterone level   . Depression   . Difficult intubation    difficult intubation 12/03/09 and 03/27/10 ([facility]); glidescope used 03/27/10  . DVT (deep venous  thrombosis) ([place]) ~ 2013   "BLE"  . Dysrhythmia    A. Fib  . Emphysema of lung ([place])   . Enlarged prostate   . Falls frequently    > 20 times in the last year/notes 10/15/2014  . GERD (gastroesophageal reflux disease)   . Heart murmur   . History of blood transfusion 1960   "lots; related to an accident"  .  History of echocardiogram    post AVR >> Echo 7/16:  Mild LVH, EF 20-25%, AVR ok (peak 15 mmHg, mean 9 mmHg), MAC, mild MR, severe LAE, mild reduced RVSF, mild RAE, PASP 35 mmHg  . Hypercholesteremia   . Hypertension   . Hypothyroidism   . OSA (obstructive sleep apnea)    "suppose to wear mask; I throw it off in my sleep" (10/15/2014)  . Plantar fasciitis   . Right fibular fracture 10/15/2014  . Seasonal allergies   . Shortness of breath dyspnea   . Syncope 10/15/2014  . Syncope and collapse "several times"  . Urine frequency   . Urine incontinence   . Varicose veins    Past Surgical History:  Procedure Laterality Date  . ABDOMINAL AORTIC ANEURYSM REPAIR  01/2006; 03/10/2006   Archie Endo 01/12/2011  . AORTIC VALVE REPLACEMENT N/A 02/11/2015   Procedure: AORTIC VALVE REPLACEMENT (AVR);  Surgeon: Grace Isaac, MD;  Location: [facility];  Service: Open Heart Surgery;  Laterality: N/A;  . BRAIN SURGERY  1960 X 4   "S/P got my skull busted"; pt. states removed internal carotid artery  . CARDIAC CATHETERIZATION  1990's X 1; 09/2014   no stents  . CIRCUMCISION  05/2019  . CLIPPING OF ATRIAL APPENDAGE N/A 02/11/2015   Procedure: CLIPPING OF ATRIAL APPENDAGE;  Surgeon: Grace Isaac, MD;  Location: [facility];  Service: Open Heart Surgery;  Laterality: N/A;  . ERCP  11/2009   Archie Endo 12/05/2009  . gall stone    . HERNIA REPAIR    . LAPAROSCOPIC CHOLECYSTECTOMY  08/2009   w/IOC/notes 09/18/2009  . LAPAROSCOPIC INCISIONAL / UMBILICAL / VENTRAL HERNIA REPAIR  02/2010   VHR w/mesh/notes 03/28/2010  . NOSE SURGERY    . TEE WITHOUT CARDIOVERSION N/A 02/11/2015   Procedure: TRANSESOPHAGEAL  ECHOCARDIOGRAM (TEE);  Surgeon: Grace Isaac, MD;  Location: [facility];  Service: Open Heart Surgery;  Laterality: N/A;  . TONSILLECTOMY       Current Meds  Medication Sig  . Bisacodyl (DULCOLAX PO) Take 1 capsule by mouth as needed.  . carvedilol (COREG) 3.125 MG tablet Take 1 tablet (3.125 mg total) by mouth daily.  Marland Kitchen dutasteride (AVODART) 0.5 MG capsule Take 1 capsule (0.5 mg total) by mouth daily.  Marland Kitchen escitalopram (LEXAPRO) 10 MG tablet Take 1 tablet by mouth daily : DX F41.9, F32.9  . FIBER PO Take 2 tablets by mouth daily. 4 gram overthecounter [facility]  . ibuprofen (ADVIL) 600 MG tablet Take 600 mg by mouth every 6 (six) hours as needed.  Marland Kitchen ipratropium-albuterol (DUONEB) 0.5-2.5 (3) MG/3ML SOLN Take 3 mLs by nebulization every 4 (four) hours as needed.  Marland Kitchen levothyroxine (SYNTHROID) 150 MCG tablet TAKE 1 TABLET (150 MCG TOTAL) BY MOUTH DAILY BEFORE BREAKFAST.  . Melatonin 10 MG CAPS Take 10 mg by mouth daily as needed (for sleep).  . memantine (NAMENDA XR) 28 MG CP24 24 hr capsule Take 1 capsule (28 mg total) by mouth daily.  . NON FORMULARY Diet Type:  Regular NAS. Heart Healthy  . Tiotropium Bromide Monohydrate (SPIRIVA RESPIMAT) 2.5 MCG/ACT AERS Inhale 2 puffs into the lungs daily. (Patient taking differently: Inhale 2 puffs into the lungs daily as needed. )     Allergies:   Haldol [haloperidol lactate]   Social History   Tobacco Use  . Smoking status: Former Smoker    Packs/day: 2.00    Years: 22.00    Pack years: 44.00    Types: Cigarettes  Quit date: 04/29/1974    Years since quitting: 45.3  . Smokeless tobacco: Never Used  Substance Use Topics  . Alcohol use: Not Currently    Alcohol/week: 0.0 standard drinks    Comment: 1 beer per year   . Drug use: No     Family Hx: The patient's family history includes Breast cancer in his sister; Diabetes in his mother; Heart disease in his father; Kidney disease in his father; Other in his sister; Rheum arthritis in his father  and mother. There is no history of Colon cancer, Colon polyps, or Liver disease.  ROS:   Please see the history of present illness.     All other systems reviewed and are negative.   Prior CV studies:   The following studies were reviewed today:    Labs/Other Tests and Data Reviewed:    EKG:  No ECG reviewed.  Recent Labs: 02/03/2019: B Natriuretic Peptide 244.6; Magnesium 1.9 05/11/2019: ALT 16; BUN 20; Creatinine 1.7; Hemoglobin 10.1; Platelets 199; Potassium 4.3; Sodium 139; TSH 0.20   Recent Lipid Panel Lab Results  Component Value Date/Time   CHOL 126 11/14/2018 02:04 PM   TRIG 147 11/14/2018 02:04 PM   HDL 28 (L) 11/14/2018 02:04 PM   CHOLHDL 4.5 11/14/2018 02:04 PM   [facility] 75 11/14/2018 02:04 PM    Wt Readings from Last 3 Encounters:  09/10/19 202 lb (91.6 kg)  06/25/19 200 lb (90.7 kg)  02/28/19 225 lb 3.2 oz (102.2 kg)     Objective:    Vital Signs:  BP 131/84   Pulse 77   Ht 6' (1.829 m)   Wt 202 lb (91.6 kg)   SpO2 98%   BMI 27.40 kg/m    VITAL SIGNS:  reviewed GEN:  no acute distress EYES:  sclerae anicteric, EOMI - Extraocular Movements Intact RESPIRATORY:  normal respiratory effort, symmetric expansion CARDIOVASCULAR:  no peripheral edema SKIN:  no rash, lesions or ulcers. MUSCULOSKELETAL:  no obvious deformities. NEURO:  alert and oriented x 3, no obvious focal deficit PSYCH:  unable to assess,  son did most of the talking   ASSESSMENT & PLAN:    1. Atrial fibrillation: The patient is no longer on Eliquis because of a history of anemia.  He has had left atrial appendage clipping at the time of his aortic valve surgery.  Continue current medications.  2.  Bacterial endocarditis: The patient has been seen by cardiology in [place], [place].  There is concern for endocarditis because of his recurrent episodes of sepsis.  A transesophageal echo was recommended at 1 point but but because of the patient's declining health and limited options,  we have not pursued the transesophageal echo.  The cardiologist up in [place] also agrees that a TEE would not necessarily be helpful.  I explained to the son that a TEE would be helpful only if we were contemplating open heart surgery with valve repair or replacement and unfortunately, Mr. Arpino is not a candidate for either.  COVID-19 Education: The signs and symptoms of COVID-19 were discussed with the patient and how to seek care for testing (follow up with PCP or arrange E-visit).  The importance of social distancing was discussed today.  Time:   Today, I have spent 19 minutes with the patient with telehealth technology discussing the above problems.     Medication Adjustments/Labs and Tests Ordered: Current medicines are reviewed at length with the patient today.  Concerns regarding medicines are outlined above.   Tests  Ordered: No orders of the defined types were placed in this encounter.   Medication Changes: No orders of the defined types were placed in this encounter.   Follow Up:  Either In Person or Virtual in 6 month(s) With Kathyrn Drown, NP    Signed, Mertie Moores, MD  09/10/2019 9:47 AM    [place]

## 2019-09-10 NOTE — Addendum Note (Signed)
Addended by: Dossie Arbour on: 09/10/2019 10:25 AM   Modules accepted: Orders

## 2019-09-24 ENCOUNTER — Other Ambulatory Visit: Payer: Self-pay | Admitting: Nurse Practitioner

## 2019-09-24 DIAGNOSIS — F32A Depression, unspecified: Secondary | ICD-10-CM

## 2019-09-24 DIAGNOSIS — F329 Major depressive disorder, single episode, unspecified: Secondary | ICD-10-CM

## 2019-09-24 NOTE — Telephone Encounter (Signed)
High risk or very high risk warning populated when attempting to refill medication. RX request sent to PCP for review and approval if warranted.   

## 2019-09-24 NOTE — Telephone Encounter (Signed)
Will approve but pt needs to schedule a 3 month follow up- he is due.

## 2019-10-08 ENCOUNTER — Encounter: Payer: Self-pay | Admitting: Nurse Practitioner

## 2019-10-08 ENCOUNTER — Telehealth (INDEPENDENT_AMBULATORY_CARE_PROVIDER_SITE_OTHER): Payer: Medicare PPO | Admitting: Nurse Practitioner

## 2019-10-08 ENCOUNTER — Other Ambulatory Visit: Payer: Self-pay

## 2019-10-08 ENCOUNTER — Ambulatory Visit (INDEPENDENT_AMBULATORY_CARE_PROVIDER_SITE_OTHER): Payer: Medicare PPO | Admitting: Nurse Practitioner

## 2019-10-08 VITALS — BP 137/90 | HR 84

## 2019-10-08 DIAGNOSIS — Z0000 Encounter for general adult medical examination without abnormal findings: Secondary | ICD-10-CM

## 2019-10-08 DIAGNOSIS — F3341 Major depressive disorder, recurrent, in partial remission: Secondary | ICD-10-CM

## 2019-10-08 DIAGNOSIS — I951 Orthostatic hypotension: Secondary | ICD-10-CM

## 2019-10-08 DIAGNOSIS — G301 Alzheimer's disease with late onset: Secondary | ICD-10-CM

## 2019-10-08 DIAGNOSIS — G4733 Obstructive sleep apnea (adult) (pediatric): Secondary | ICD-10-CM

## 2019-10-08 DIAGNOSIS — J449 Chronic obstructive pulmonary disease, unspecified: Secondary | ICD-10-CM | POA: Diagnosis not present

## 2019-10-08 DIAGNOSIS — N401 Enlarged prostate with lower urinary tract symptoms: Secondary | ICD-10-CM

## 2019-10-08 DIAGNOSIS — J4489 Other specified chronic obstructive pulmonary disease: Secondary | ICD-10-CM

## 2019-10-08 DIAGNOSIS — I714 Abdominal aortic aneurysm, without rupture, unspecified: Secondary | ICD-10-CM

## 2019-10-08 DIAGNOSIS — I4821 Permanent atrial fibrillation: Secondary | ICD-10-CM

## 2019-10-08 DIAGNOSIS — F0280 Dementia in other diseases classified elsewhere without behavioral disturbance: Secondary | ICD-10-CM

## 2019-10-08 DIAGNOSIS — R42 Dizziness and giddiness: Secondary | ICD-10-CM

## 2019-10-08 DIAGNOSIS — I5032 Chronic diastolic (congestive) heart failure: Secondary | ICD-10-CM

## 2019-10-08 DIAGNOSIS — N183 Chronic kidney disease, stage 3 unspecified: Secondary | ICD-10-CM

## 2019-10-08 DIAGNOSIS — R350 Frequency of micturition: Secondary | ICD-10-CM

## 2019-10-08 DIAGNOSIS — I25118 Atherosclerotic heart disease of native coronary artery with other forms of angina pectoris: Secondary | ICD-10-CM | POA: Diagnosis not present

## 2019-10-08 DIAGNOSIS — I209 Angina pectoris, unspecified: Secondary | ICD-10-CM | POA: Insufficient documentation

## 2019-10-08 NOTE — Progress Notes (Signed)
   This service is provided via telemedicine  Vitals collected by patients son in his home  Location of [name] [name] (ex: home, work):  Home   [name] [name] consents to a telephone visit:  Yes    Location of the provider (ex: office, home):  [employer], Office   Name of any referring provider:  N/A  Names of all persons participating in the telemedicine service and their role in the encounter:  S.Chrae B/CMA, Abbey Chatters, NP, Ardelia Mems (son) and [name] [name]   Time spent on call:  8 min with medical assistant

## 2019-10-08 NOTE — Progress Notes (Signed)
Careteam: Patient Care Team: Lauree Chandler, NP as PCP - General (Geriatric Medicine) Nahser, [name], MD as PCP - Cardiology (Cardiology) Nahser, [name], MD as Consulting Physician (Cardiology) [name], MD as Referring Physician (Urology)  Advanced Directive information    Allergies  Allergen Reactions  . Haldol [Haloperidol Lactate]     Over-sedation and at times agitation     Chief Complaint  Patient presents with  . Medical Management of Chronic Issues    4 month follow-up. Telehealth/mychart video visit   . Dizziness    Patient c/o dizziness      HPI: Patient is a 86 y.o. male for 4 month follow up.   Pt is seeing cardiologist in [place] as well as follow up with Dr Acie Fredrickson- they have decided against TEE due to pts overall health  A fib- not on anticoagulation due to hx of bleeding and eliquis.   Pt has been getting dizzy when he stands up-  strategies that they have been doing are sitting up to the side of the chair before standing but that has not helped. No passing out or blacking out- feels like he may though.   Reports they have been checking blood pressure and HR due to this- states blood pressure 137/60-90. Not tracking pulse rate.   No longer having home health agency coming out.   Depression- partial remission- better on lexapro. His wife is well into her dementia. She also has bipolar which son feels like effects MR Cabiness's mood.   Review of Systems:  Review of Systems  Constitutional: Negative for chills, fever and weight loss.  HENT: Negative for tinnitus.   Respiratory: Negative for cough, sputum production and shortness of breath.   Cardiovascular: Negative for chest pain, palpitations and leg swelling.  Gastrointestinal: Negative for abdominal pain, constipation, diarrhea and heartburn.  Genitourinary: Negative for dysuria, frequency and urgency.  Musculoskeletal: Negative for back pain, falls, joint pain and myalgias.  Skin:  Negative.   Neurological: Positive for dizziness. Negative for headaches.  Psychiatric/Behavioral: Positive for depression (improved on medication. ) and memory loss (improved in the last year). The patient does not have insomnia.     Past Medical History:  Diagnosis Date  . Anxiety   . Aortic valve insufficiency   . Arthritis    "wee bit; knees, elbows" (10/15/2014)  . Atrial fibrillation ([place])   . Cardiomyopathy ([place])   . Chronic kidney disease    "down to ~ 1/2 of their regular use; I see kidney dr. in [place]" (10/15/2014)  . Complication of anesthesia    unsure of complications but pt. states there were complications  . COPD (chronic obstructive pulmonary disease) ([place])    on home o2  . Coronary artery disease   . Decreased testosterone level   . Depression   . Difficult intubation    difficult intubation 12/03/09 and 03/27/10 ([facility]); glidescope used 03/27/10  . DVT (deep venous thrombosis) ([place]) ~ 2013   "BLE"  . Dysrhythmia    A. Fib  . Emphysema of lung ([place])   . Enlarged prostate   . Falls frequently    > 20 times in the last year/notes 10/15/2014  . GERD (gastroesophageal reflux disease)   . Heart murmur   . History of blood transfusion 1960   "lots; related to an accident"  . History of echocardiogram    post AVR >> Echo 7/16:  Mild LVH, EF 20-25%, AVR ok (peak 15 mmHg, mean 9 mmHg),  MAC, mild MR, severe LAE, mild reduced RVSF, mild RAE, PASP 35 mmHg  . Hypercholesteremia   . Hypertension   . Hypothyroidism   . OSA (obstructive sleep apnea)    "suppose to wear mask; I throw it off in my sleep" (10/15/2014)  . Plantar fasciitis   . Right fibular fracture 10/15/2014  . Seasonal allergies   . Shortness of breath dyspnea   . Syncope 10/15/2014  . Syncope and collapse "several times"  . Urine frequency   . Urine incontinence   . Varicose veins    Past Surgical History:  Procedure Laterality Date  . ABDOMINAL AORTIC ANEURYSM REPAIR  01/2006; 03/10/2006   Archie Endo  01/12/2011  . AORTIC VALVE REPLACEMENT N/A 02/11/2015   Procedure: AORTIC VALVE REPLACEMENT (AVR);  Surgeon: Grace Isaac, MD;  Location: [facility];  Service: Open Heart Surgery;  Laterality: N/A;  . BRAIN SURGERY  1960 X 4   "S/P got my skull busted"; pt. states removed internal carotid artery  . CARDIAC CATHETERIZATION  1990's X 1; 09/2014   no stents  . CIRCUMCISION  05/2019  . CLIPPING OF ATRIAL APPENDAGE N/A 02/11/2015   Procedure: CLIPPING OF ATRIAL APPENDAGE;  Surgeon: Grace Isaac, MD;  Location: [facility];  Service: Open Heart Surgery;  Laterality: N/A;  . ERCP  11/2009   Archie Endo 12/05/2009  . gall stone    . HERNIA REPAIR    . LAPAROSCOPIC CHOLECYSTECTOMY  08/2009   w/IOC/notes 09/18/2009  . LAPAROSCOPIC INCISIONAL / UMBILICAL / VENTRAL HERNIA REPAIR  02/2010   VHR w/mesh/notes 03/28/2010  . NOSE SURGERY    . TEE WITHOUT CARDIOVERSION N/A 02/11/2015   Procedure: TRANSESOPHAGEAL ECHOCARDIOGRAM (TEE);  Surgeon: Grace Isaac, MD;  Location: [facility];  Service: Open Heart Surgery;  Laterality: N/A;  . TONSILLECTOMY     Social History:   reports that he quit smoking about 45 years ago. His smoking use included cigarettes. He has a 44.00 pack-year smoking history. He has never used smokeless tobacco. He reports previous alcohol use. He reports that he does not use drugs.  Family History  Problem Relation Age of Onset  . Rheum arthritis Mother   . Diabetes Mother   . Heart disease Father   . Rheum arthritis Father   . Kidney disease Father   . Other Sister        Murdered  . Breast cancer Sister   . Colon cancer Neg Hx   . Colon polyps Neg Hx   . Liver disease Neg Hx     Medications: Patient's Medications  New Prescriptions   No medications on file  Previous Medications   BISACODYL (DULCOLAX PO)    Take 1 capsule by mouth as needed.   CARVEDILOL (COREG) 3.125 MG TABLET    Take 1 tablet (3.125 mg total) by mouth daily.   DUTASTERIDE (AVODART) 0.5 MG CAPSULE    Take 1 capsule  (0.5 mg total) by mouth daily.   ESCITALOPRAM (LEXAPRO) 10 MG TABLET    TAKE 1 TABLET BY MOUTH EVERY DAY   FIBER PO    Take 2 tablets by mouth daily. 4 gram overthecounter [facility]   IBUPROFEN (ADVIL) 600 MG TABLET    Take 600 mg by mouth every 6 (six) hours as needed.   IPRATROPIUM-ALBUTEROL (DUONEB) 0.5-2.5 (3) MG/3ML SOLN    Take 3 mLs by nebulization every 4 (four) hours as needed.   LEVOTHYROXINE (SYNTHROID) 150 MCG TABLET    TAKE 1 TABLET (150 MCG TOTAL)  BY MOUTH DAILY BEFORE BREAKFAST.   MELATONIN 10 MG CAPS    Take 10 mg by mouth daily as needed (for sleep).   MEMANTINE (NAMENDA XR) 28 MG CP24 24 HR CAPSULE    Take 1 capsule (28 mg total) by mouth daily.   NON FORMULARY    Diet Type:  Regular NAS. Heart Healthy   TIOTROPIUM BROMIDE MONOHYDRATE 2.5 MCG/ACT AERS    Inhale 2 puffs into the lungs as needed.  Modified Medications   No medications on file  Discontinued Medications   No medications on file    Physical Exam:  There were no vitals filed for this visit. There is no height or weight on file to calculate BMI. Wt Readings from Last 3 Encounters:  09/10/19 202 lb (91.6 kg)  06/25/19 200 lb (90.7 kg)  02/28/19 225 lb 3.2 oz (102.2 kg)      Labs reviewed: Basic Metabolic Panel: Recent Labs    10/16/18 0831 10/16/18 0831 11/14/18 1404 02/03/19 1940 05/11/19 0000  NA 143   < > 141 137 139  K 4.4   < > 5.1 4.4 4.3  CL 108  --  106 105  --   CO2 28  --  27 25  --   GLUCOSE 104*  --  97 98  --   BUN 22   < > _0 CREATININE 1.51*   < > 1.69* 1.74* 1.7*  CALCIUM 8.9  --  8.7 8.6*  --   MG  --   --   --  1.9  --   TSH  --   --  7.15*  --  0.20*   < > = values in this interval not displayed.   Liver Function Tests: Recent Labs    10/16/18 0831 10/16/18 0831 11/14/18 1404 02/03/19 1940 05/11/19 0000  AST 28   < > _1 ALT 43   < > _2 ALKPHOS  --   --   --  111 121  BILITOT 1.0  --  0.6 1.2  --   PROT 6.5  --  6.5 7.0  --   ALBUMIN  --   --    --  3.0*  --    < > = values in this interval not displayed.   Recent Labs    02/03/19 1940  LIPASE 30   No results for input(s): AMMONIA in the last 8760 hours. CBC: Recent Labs    02/03/19 1940 05/11/19 0000  WBC 5.0 5.1  NEUTROABS  --  3  HGB 10.3* 10.1*  HCT 33.6* 34*  MCV 94.9  --   PLT 159 199   Lipid Panel: Recent Labs    11/14/18 1404  CHOL 126  HDL 28*  LDLCALC 75  TRIG 147  CHOLHDL 4.5   TSH: Recent Labs    11/14/18 1404 05/11/19 0000  TSH 7.15* 0.20*   A1C: Lab Results  Component Value Date   HGBA1C 5.7 (H) 01/28/2018     Assessment/Plan 1. COPD (chronic obstructive pulmonary disease) with chronic bronchitis (HCC) Stable, continues to follow up with Dr Geannie Risen [facility].  Continues on 3L [place]  2. Chronic diastolic heart failure (HCC) -stable, not requiring diuretics at this time. Continues on coreg daily. No signs of fluid overload/shortness of breath/swelling.  3. Aneurysm of abdominal vessel (HCC) -s/p repair by Dr Bobette Mo. Son reports cardiologist is following, however it appears he was following with vascular. Ct chest  11/2018 was stable and periodic echo recommended.   4. Permanent atrial fibrillation ([place])  -son reports rate control but has not recorded HR. Not on anticoagulation due to significant anemia. Continues on carvediolol for rate control.  S/p left atrial appendage clipping at time of his aortic valve surgery per cardiologist recent note.  6. Coronary artery disease of native artery of native heart with stable angina pectoris (HCC) -stable, without chest pains on coreg 3.125 mg daily  7. Orthostatic hypotension -suspect this is cause of dizziness, instructed son to take orthostatic VS by having him lay down for 2 minutes then to take BP and HR, sit for 2 mins then to take BP and HR and then to stand for 2 mins and that BP and HR and record and send to [place] via mychart. Will also get referral to home health pt/ot at this  time. - Ambulatory referral to Home Health  8. Obstructive sleep apnea -could not tolerate CPAP therefore he does not use.  9. Benign prostatic hyperplasia with urinary frequency -stable.   10. Stage 3 chronic kidney disease, unspecified whether stage 3a or 3b CKD -will follow up lab, Encourage proper hydration and to avoid NSAIDS (Aleve, Advil, Motrin, Ibuprofen).   11. Late onset Alzheimer's disease without behavioral disturbance ([place]) -stable on namenda xr, memory has actually improved significantly since last year.  12. Dizziness -when he goes from sitting to standing, suspect orthostatic, will have son take VS and have home health, encouraged to continue to change positions slowly. Also will obtain blood work for TSH, CMP and CBC per home health. - Ambulatory referral to [place]. Depression -improved on lexapro, his wife with dementia and bipolar and her mood can effect his. Overall stable with lexapro. Encouraged to increase physical activity to also help with mood. So SI or HI.   Next appt: 4 months. Carlos American. Harle Battiest  [facility] [phone]   Virtual Visit via Video Note  I connected with [name] on 10/08/19 at  2:45 PM EST by a video enabled telemedicine application and verified that I am speaking with the correct person using two identifiers.  Location: Patient: home Provider: office   I discussed the limitations of evaluation and management by telemedicine and the availability of in person appointments. The patient expressed understanding and agreed to proceed.    I discussed the assessment and treatment plan with the patient. The patient was provided an opportunity to ask questions and all were answered. The patient agreed with the plan and demonstrated an understanding of the instructions.   The patient was advised to call back or seek an in-person evaluation if the symptoms worsen or if the condition fails to  improve as anticipated.  I provided 25 minutes of non-face-to-face time during this encounter.  Carlos American. Dewaine Oats, AGNP Avs printed and mailed.

## 2019-10-08 NOTE — Progress Notes (Signed)
   This service is provided via telemedicine  Vitals recorded were provided by patients son and obtained at home.  Location of patient (ex: home, work):  Home  Patient consents to a telephone visit:  Yes  Location of the provider (ex: office, home):  [facility], Office   Name of any referring provider:  N/A  Names of all persons participating in the telemedicine service and their role in the encounter:  S.Chrae B/CMA, Abbey Chatters, NP, [name](son), Mrs.[place], and Patient   Time spent on call:  10 min with medical assistant

## 2019-10-08 NOTE — Patient Instructions (Signed)
[name] , Thank you for taking time to come for your Medicare Wellness Visit. I appreciate your ongoing commitment to your health goals. Please review the following plan we discussed and let me know if I can assist you in the future.   Screening recommendations/referrals: Colonoscopy aged out  Recommended yearly ophthalmology/optometry visit for glaucoma screening and checkup Recommended yearly dental visit for hygiene and checkup  Vaccinations: Influenza vaccine up to date Pneumococcal vaccine up to date Tdap vaccine RECOMMENDED,  to get at local pharmacy Shingles vaccine RECOMMENDED, to get at local pharmacy    Advanced directives: on file.   Conditions/risks identified: increase physical activity- goal 30 mins of exercise 5 days a week  Next appointment: 1 year   Preventive Care 84 Years and Older, Male Preventive care refers to lifestyle choices and visits with your health care provider that can promote health and wellness. What does preventive care include?  A yearly physical exam. This is also called an annual well check.  Dental exams once or twice a year.  Routine eye exams. Ask your health care provider how often you should have your eyes checked.  Personal lifestyle choices, including:  Daily care of your teeth and gums.  Regular physical activity.  Eating a healthy diet.  Avoiding tobacco and drug use.  Limiting alcohol use.  Practicing safe sex.  Taking low doses of aspirin every day.  Taking vitamin and mineral supplements as recommended by your health care provider. What happens during an annual well check? The services and screenings done by your health care provider during your annual well check will depend on your age, overall health, lifestyle risk factors, and family history of disease. Counseling  Your health care provider may ask you questions about your:  Alcohol use.  Tobacco use.  Drug use.  Emotional well-being.  Home and  relationship well-being.  Sexual activity.  Eating habits.  History of falls.  Memory and ability to understand (cognition).  Work and work Astronomer. Screening  You may have the following tests or measurements:  Height, weight, and BMI.  Blood pressure.  Lipid and cholesterol levels. These may be checked every 5 years, or more frequently if you are over 12 years old.  Skin check.  Lung cancer screening. You may have this screening every year starting at age 84 if you have a 30-pack-year history of smoking and currently smoke or have quit within the past 15 years.  Fecal occult blood test (FOBT) of the stool. You may have this test every year starting at age 84.  Flexible sigmoidoscopy or colonoscopy. You may have a sigmoidoscopy every 5 years or a colonoscopy every 10 years starting at age 84.  Prostate cancer screening. Recommendations will vary depending on your family history and other risks.  Hepatitis C blood test.  Hepatitis B blood test.  Sexually transmitted disease (STD) testing.  Diabetes screening. This is done by checking your blood sugar (glucose) after you have not eaten for a while (fasting). You may have this done every 1-3 years.  Abdominal aortic aneurysm (AAA) screening. You may need this if you are a current or former smoker.  Osteoporosis. You may be screened starting at age 84 if you are at high risk. Talk with your health care provider about your test results, treatment options, and if necessary, the need for more tests. Vaccines  Your health care provider may recommend certain vaccines, such as:  Influenza vaccine. This is recommended every year.  Tetanus, diphtheria, and  acellular pertussis (Tdap, Td) vaccine. You may need a Td booster every 10 years.  Zoster vaccine. You may need this after age 39.  Pneumococcal 13-valent conjugate (PCV13) vaccine. One dose is recommended after age 19.  Pneumococcal polysaccharide (PPSV23) vaccine. One  dose is recommended after age 61. Talk to your health care provider about which screenings and vaccines you need and how often you need them. This information is not intended to replace advice given to you by your health care provider. Make sure you discuss any questions you have with your health care provider. Document Released: 09/12/2015 Document Revised: 05/05/2016 Document Reviewed: 06/17/2015 Elsevier Interactive Patient Education  2017 [employer] Prevention in the Home Falls can cause injuries. They can happen to people of all ages. There are many things you can do to make your home safe and to help prevent falls. What can I do on the outside of my home?  Regularly fix the edges of walkways and driveways and fix any cracks.  Remove anything that might make you trip as you walk through a door, such as a raised step or threshold.  Trim any bushes or trees on the path to your home.  Use bright outdoor lighting.  Clear any walking paths of anything that might make someone trip, such as rocks or tools.  Regularly check to see if handrails are loose or broken. Make sure that both sides of any steps have handrails.  Any raised decks and porches should have guardrails on the edges.  Have any leaves, snow, or ice cleared regularly.  Use sand or salt on walking paths during winter.  Clean up any spills in your garage right away. This includes oil or grease spills. What can I do in the bathroom?  Use night lights.  Install grab bars by the toilet and in the tub and shower. Do not use towel bars as grab bars.  Use non-skid mats or decals in the tub or shower.  If you need to sit down in the shower, use a plastic, non-slip stool.  Keep the floor dry. Clean up any water that spills on the floor as soon as it happens.  Remove soap buildup in the tub or shower regularly.  Attach bath mats securely with double-sided non-slip rug tape.  Do not have throw rugs and other  things on the floor that can make you trip. What can I do in the bedroom?  Use night lights.  Make sure that you have a light by your bed that is easy to reach.  Do not use any sheets or blankets that are too big for your bed. They should not hang down onto the floor.  Have a firm chair that has side arms. You can use this for support while you get dressed.  Do not have throw rugs and other things on the floor that can make you trip. What can I do in the kitchen?  Clean up any spills right away.  Avoid walking on wet floors.  Keep items that you use a lot in easy-to-reach places.  If you need to reach something above you, use a strong step stool that has a grab bar.  Keep electrical cords out of the way.  Do not use floor polish or wax that makes floors slippery. If you must use wax, use non-skid floor wax.  Do not have throw rugs and other things on the floor that can make you trip. What can I do with my stairs?  Do not leave any items on the stairs.  Make sure that there are handrails on both sides of the stairs and use them. Fix handrails that are broken or loose. Make sure that handrails are as long as the stairways.  Check any carpeting to make sure that it is firmly attached to the stairs. Fix any carpet that is loose or worn.  Avoid having throw rugs at the top or bottom of the stairs. If you do have throw rugs, attach them to the floor with carpet tape.  Make sure that you have a light switch at the top of the stairs and the bottom of the stairs. If you do not have them, ask someone to add them for you. What else can I do to help prevent falls?  Wear shoes that:  Do not have high heels.  Have rubber bottoms.  Are comfortable and fit you well.  Are closed at the toe. Do not wear sandals.  If you use a stepladder:  Make sure that it is fully opened. Do not climb a closed stepladder.  Make sure that both sides of the stepladder are locked into place.  Ask  someone to hold it for you, if possible.  Clearly mark and make sure that you can see:  Any grab bars or handrails.  First and last steps.  Where the edge of each step is.  Use tools that help you move around (mobility aids) if they are needed. These include:  Canes.  Walkers.  Scooters.  Crutches.  Turn on the lights when you go into a dark area. Replace any light bulbs as soon as they burn out.  Set up your furniture so you have a clear path. Avoid moving your furniture around.  If any of your floors are uneven, fix them.  If there are any pets around you, be aware of where they are.  Review your medicines with your doctor. Some medicines can make you feel dizzy. This can increase your chance of falling. Ask your doctor what other things that you can do to help prevent falls. This information is not intended to replace advice given to you by your health care provider. Make sure you discuss any questions you have with your health care provider. Document Released: 06/12/2009 Document Revised: 01/22/2016 Document Reviewed: 09/20/2014 Elsevier Interactive Patient Education  2017 [employer].

## 2019-10-08 NOTE — Patient Instructions (Signed)
lay down for 2 minutes then to take BP and HR, sit for 2 mins then to take BP and HR and then to stand for 2 mins and that BP and HR and record and send to [place] via mychart.

## 2019-10-08 NOTE — Progress Notes (Signed)
Subjective:   [name] [name] is a 86 y.o. male who presents for Medicare Annual/Subsequent preventive examination.  Review of Systems:   Cardiac Risk Factors include: hypertension;male gender;family history of premature cardiovascular disease;sedentary lifestyle;advanced age (>68mn, >>22women)     Objective:    Vitals: BP 137/90   Pulse 84   SpO2 99% Comment: 3 liters  There is no height or weight on file to calculate BMI.  Advanced Directives 10/08/2019 06/06/2019 03/05/2019 02/28/2019 02/07/2019 02/03/2019 09/04/2018  Does Patient Have a Medical Advance Directive? _0  No No  Type of AParamed[place]of [place] will;Out of facility DNR (pink MOST or yellow form) Out of facility DNR (pink MOST or yellow form) Out of facility DNR (pink MOST or yellow form) Out of facility DNR (pink MOST or yellow form) Out of facility DNR (pink MOST or yellow form) - -  Does patient want to make changes to medical advance directive? No - Patient declined No - Patient declined No - Guardian declined No - Guardian declined No - Patient declined - -  [place] of [place]? No - copy requested - - - - - -  Would patient like information on creating a medical advance directive? - - - - No - Patient declined No - Patient declined Yes (Inpatient - patient requests chaplain consult to create a medical advance directive)  Pre-existing out of facility DNR order (yellow form or pink MOST form) - - Yellow form placed in chart (order not valid for inpatient use) Yellow form placed in chart (order not valid for inpatient use) Yellow form placed in chart (order not valid for inpatient use) - -    Tobacco Social History   Tobacco Use  Smoking Status Former Smoker  . Packs/day: 2.00  . Years: 22.00  . Pack years: 44.00  . Types: Cigarettes  . Quit date: 04/29/1974  . Years since quitting: 45.4  Smokeless Tobacco Never Used     Counseling given: Not  Answered   Clinical Intake:  Pre-visit preparation completed: Yes  Pain : No/denies pain     BMI - recorded: 27.4 Nutritional Status: BMI 25 -29 Overweight Nutritional Risks: None Diabetes: No  How often do you need to have someone help you when you read instructions, pamphlets, or other written materials from your doctor or pharmacy?: 1 - Never What is the last grade level you completed in school?: post masters        Past Medical History:  Diagnosis Date  . Anxiety   . Aortic valve insufficiency   . Arthritis    "wee bit; knees, elbows" (10/15/2014)  . Atrial fibrillation ([place]   . Cardiomyopathy ([place]   . Chronic kidney disease    "down to ~ 1/2 of their regular use; [place]. [place] (10/15/2014)  . Complication of anesthesia    unsure of complications but pt. states there were complications  . COPD (chronic obstructive pulmonary disease) ([place]    on home o2  . Coronary artery disease   . Decreased testosterone level   . Depression   . Difficult intubation    difficult intubation 12/03/09 and 03/27/10 ([facility]); glidescope used 03/27/10  . DVT (deep venous thrombosis) ([facility] ~ 2013   "BLE"  . Dysrhythmia    A. Fib  . Emphysema of lung ([place]   . Enlarged prostate   . Falls frequently    > 20 times in the last  year/notes 10/15/2014  . GERD (gastroesophageal reflux disease)   . Heart murmur   . History of blood transfusion 1960   "lots; related to an accident"  . History of echocardiogram    post AVR >> Echo 7/16:  Mild LVH, EF 20-25%, AVR ok (peak 15 mmHg, mean 9 mmHg), MAC, mild MR, severe LAE, mild reduced RVSF, mild RAE, PASP 35 mmHg  . Hypercholesteremia   . Hypertension   . Hypothyroidism   . OSA (obstructive sleep apnea)    "suppose to wear mask; I throw it off in my sleep" (10/15/2014)  . Plantar fasciitis   . Right fibular fracture 10/15/2014  . Seasonal allergies   . Shortness of breath dyspnea   . Syncope 10/15/2014  . Syncope and collapse  "several times"  . Urine frequency   . Urine incontinence   . Varicose veins    Past Surgical History:  Procedure Laterality Date  . ABDOMINAL AORTIC ANEURYSM REPAIR  01/2006; 03/10/2006   Archie Endo 01/12/2011  . AORTIC VALVE REPLACEMENT N/A 02/11/2015   Procedure: AORTIC VALVE REPLACEMENT (AVR);  Surgeon: Grace Isaac, MD;  Location: [facility];  Service: Open Heart Surgery;  Laterality: N/A;  . BRAIN SURGERY  1960 X 4   "S/P got my skull busted"; pt. states removed internal carotid artery  . CARDIAC CATHETERIZATION  1990's X 1; 09/2014   no stents  . CIRCUMCISION  05/2019  . CLIPPING OF ATRIAL APPENDAGE N/A 02/11/2015   Procedure: CLIPPING OF ATRIAL APPENDAGE;  Surgeon: Grace Isaac, MD;  Location: [facility];  Service: Open Heart Surgery;  Laterality: N/A;  . ERCP  11/2009   Archie Endo 12/05/2009  . gall stone    . HERNIA REPAIR    . LAPAROSCOPIC CHOLECYSTECTOMY  08/2009   w/IOC/notes 09/18/2009  . LAPAROSCOPIC INCISIONAL / UMBILICAL / VENTRAL HERNIA REPAIR  02/2010   VHR w/mesh/notes 03/28/2010  . NOSE SURGERY    . TEE WITHOUT CARDIOVERSION N/A 02/11/2015   Procedure: TRANSESOPHAGEAL ECHOCARDIOGRAM (TEE);  Surgeon: Grace Isaac, MD;  Location: [facility];  Service: Open Heart Surgery;  Laterality: N/A;  . TONSILLECTOMY     Family History  Problem Relation Age of Onset  . Rheum arthritis Mother   . Diabetes Mother   . Heart disease Father   . Rheum arthritis Father   . Kidney disease Father   . Other Sister        Murdered  . Breast cancer Sister   . Colon cancer Neg Hx   . Colon polyps Neg Hx   . Liver disease Neg Hx    Social History   Socioeconomic History  . Marital status: Married    Spouse name: Joycelyn Schmid  . Number of children: 1  . Years of education: Master's  . Highest education level: Not on file  Occupational History  . Not on file  Tobacco Use  . Smoking status: Former Smoker    Packs/day: 2.00    Years: 22.00    Pack years: 44.00    Types: Cigarettes    Quit  date: 04/29/1974    Years since quitting: 45.4  . Smokeless tobacco: Never Used  Substance and Sexual Activity  . Alcohol use: Not Currently    Alcohol/week: 0.0 standard drinks    Comment: 1 beer per year   . Drug use: No  . Sexual activity: Not Currently  Other Topics Concern  . Not on file  Social History Narrative   Social History  Diet?       Do you drink/eat things with caffeine? Some diet coke      Marital status?                married                    What year were you married? 1962      Do you live in a house, apartment, assisted living, condo, trailer, etc.? condo      Is it one or more stories? Yes- do not use upstairs      How many persons live in your home? 2      Do you have any pets in your home? (please list) no      Highest level of education completed? Masters      Current or past profession: Brewing technologist      Do you exercise?        no                              Type & how often? n/a      Advanced Directives      Do you have a living will? no      Do you have a DNR form?       no                           If not, do you want to discuss one? yes      Do you have signed POA/HPOA for forms? No- has durable POA      Functional Status      Do you have difficulty bathing or dressing yourself? difficulty      Do you have difficulty preparing food or eating?  Heat up food- do not cook      Do you have difficulty managing your medications? Son puts in containers      Do you have difficulty managing your finances? Son is doing      Do you have difficulty affording your medications? no          Social Determinants of Health   Financial Resource Strain:   . Difficulty of Paying Living Expenses: Not on file  Food Insecurity:   . Worried About Charity fundraiser in the Last Year: Not on file  . Ran Out of Food in the Last Year: Not on file  Transportation Needs:   . Lack of Transportation (Medical): Not on file  . Lack of  Transportation (Non-Medical): Not on file  Physical Activity:   . Days of Exercise per Week: Not on file  . Minutes of Exercise per Session: Not on file  Stress:   . Feeling of Stress : Not on file  Social Connections:   . Frequency of Communication with Friends and Family: Not on file  . Frequency of Social Gatherings with Friends and Family: Not on file  . Attends Religious Services: Not on file  . Active Member of Clubs or Organizations: Not on file  . Attends Archivist Meetings: Not on file  . Marital Status: Not on file    Outpatient Encounter Medications as of 10/08/2019  Medication Sig  . Bisacodyl (DULCOLAX PO) Take 1 capsule by mouth as needed.  . carvedilol (COREG) 3.125 MG tablet Take 1 tablet (3.125 mg total) by mouth daily.  Marland Kitchen dutasteride (  AVODART) 0.5 MG capsule Take 1 capsule (0.5 mg total) by mouth daily.  Marland Kitchen escitalopram (LEXAPRO) 10 MG tablet TAKE 1 TABLET BY MOUTH EVERY DAY  . FIBER PO Take 2 tablets by mouth daily. 4 gram overthecounter [facility]  . ibuprofen (ADVIL) 600 MG tablet Take 600 mg by mouth every 6 (six) hours as needed.  Marland Kitchen ipratropium-albuterol (DUONEB) 0.5-2.5 (3) MG/3ML SOLN Take 3 mLs by nebulization every 4 (four) hours as needed.  Marland Kitchen levothyroxine (SYNTHROID) 150 MCG tablet TAKE 1 TABLET (150 MCG TOTAL) BY MOUTH DAILY BEFORE BREAKFAST.  . Melatonin 10 MG CAPS Take 10 mg by mouth daily as needed (for sleep).  . memantine (NAMENDA XR) 28 MG CP24 24 hr capsule Take 1 capsule (28 mg total) by mouth daily.  . NON FORMULARY Diet Type:  Regular NAS. Heart Healthy  . Tiotropium Bromide Monohydrate 2.5 MCG/ACT AERS Inhale 2 puffs into the lungs as needed.  . [DISCONTINUED] Tiotropium Bromide Monohydrate (SPIRIVA RESPIMAT) 2.5 MCG/ACT AERS Inhale 2 puffs into the lungs daily. (Patient taking differently: Inhale 2 puffs into the lungs daily as needed. )   No facility-administered encounter medications on file as of 10/08/2019.    Activities of Daily  Living In your present state of health, do you have any difficulty performing the following activities: 10/08/2019  Hearing? N  Vision? N  Difficulty concentrating or making decisions? N  Walking or climbing stairs? Y  Dressing or bathing? Y  Doing errands, shopping? Y  Preparing Food and eating ? N  Using the Toilet? N  In the past six months, have you accidently leaked urine? Y  Do you have problems with loss of bowel control? N  Managing your Medications? N  Managing your Finances? Y  Housekeeping or managing your Housekeeping? Y  Some recent data might be hidden    Patient Care Team: Lauree Chandler, NP as PCP - General (Geriatric Medicine) Nahser, [name], MD as PCP - Cardiology (Cardiology) Nahser, [name], MD as Consulting Physician (Cardiology) [name], MD as Referring Physician (Urology)   Assessment:   This is a routine wellness examination for Deaundre.  Exercise Activities and Dietary recommendations Current Exercise Habits: Home exercise routine, Type of exercise: calisthenics;stretching;strength training/weights, Time (Minutes): 10, Frequency (Times/Week): 7, Weekly Exercise (Minutes/Week): 70, Intensity: Mild  Goals    . Increase physical activity     Feb 2021 Walk more/better over the next year       Fall Risk Fall Risk  10/08/2019 06/06/2019 05/21/2019 04/30/2019 03/05/2019  Falls in the past year? 1 0 0 0 1  Comment - - - - -  Number falls in past yr: 0 0 0 0 1  Comment - - - - -  Injury with Fall? 0 0 0 0 0  Risk Factor Category  - - - - -  Risk for fall due to : - - - - -  Risk for fall due to: Comment - - - - -  Follow up - - - - -  Comment - - - - -   Is the patient's home free of loose throw rugs in walkways, pet beds, electrical cords, etc?   yes      Grab bars in the bathroom? yes      Handrails on the stairs?   yes      Adequate lighting?   yes  Timed Get Up and Go Performed:na  Depression Screen [facility] 2/9 Scores 10/08/2019  06/27/2018 11/26/2015 06/20/2015  PHQ - 2 Score 0 0 0 6  PHQ- 9 Score - - - -    Cognitive Function     6CIT Screen 10/08/2019  What Year? 0 points  What month? 0 points  What time? 0 points  Count back from 20 0 points  Months in reverse 0 points  Repeat phrase 2 points  Total Score 2    Immunization History  Administered Date(s) Administered  . Influenza, High Dose Seasonal PF 05/29/2013, 06/09/2016, 05/05/2018  . Influenza-Unspecified 04/30/2014, 08/15/2014  . Moderna SARS-COVID-2 Vaccination 09/12/2019  . Pneumococcal Conjugate-13 08/15/2014  . Pneumococcal Polysaccharide-23 04/29/2010    Qualifies for Shingles Vaccine? Yes recommended. Up   Screening Tests Health Maintenance  Topic Date Due  . INFLUENZA VACCINE  03/31/2019  . TETANUS/TDAP  02/07/2020 (Originally 10/14/1950)  . PNA vac Low Risk Adult  Completed   Cancer Screenings: Lung: Low Dose CT Chest recommended if Age 4-80 years, 30 pack-year currently smoking OR have quit w/in 15years. Patient does not qualify. Colorectal: aged out  Additional Screenings:  Hepatitis C Screening:aged      Plan:     I have personally reviewed and noted the following in the patient's chart:   . Medical and social history . Use of alcohol, tobacco or illicit drugs  . Current medications and supplements . Functional ability and status . Nutritional status . Physical activity . Advanced directives . List of other physicians . Hospitalizations, surgeries, and ER visits in previous 12 months . Vitals . Screenings to include cognitive, depression, and falls . Referrals and appointments  In addition, I have reviewed and discussed with patient certain preventive protocols, quality metrics, and best practice recommendations. A written personalized care plan for preventive services as well as general preventive health recommendations were provided to patient.     Lauree Chandler, NP  10/08/2019

## 2019-10-12 DIAGNOSIS — I130 Hypertensive heart and chronic kidney disease with heart failure and stage 1 through stage 4 chronic kidney disease, or unspecified chronic kidney disease: Secondary | ICD-10-CM

## 2019-10-12 DIAGNOSIS — I4821 Permanent atrial fibrillation: Secondary | ICD-10-CM

## 2019-10-12 DIAGNOSIS — I25119 Atherosclerotic heart disease of native coronary artery with unspecified angina pectoris: Secondary | ICD-10-CM

## 2019-10-12 DIAGNOSIS — I951 Orthostatic hypotension: Secondary | ICD-10-CM

## 2019-10-12 DIAGNOSIS — J449 Chronic obstructive pulmonary disease, unspecified: Secondary | ICD-10-CM

## 2019-10-12 DIAGNOSIS — N183 Chronic kidney disease, stage 3 unspecified: Secondary | ICD-10-CM

## 2019-10-12 DIAGNOSIS — G301 Alzheimer's disease with late onset: Secondary | ICD-10-CM

## 2019-10-12 DIAGNOSIS — I5032 Chronic diastolic (congestive) heart failure: Secondary | ICD-10-CM

## 2019-10-12 LAB — CBC AND DIFFERENTIAL
HCT: 30 — AB (ref 41–53)
Hemoglobin: 8.9 — AB (ref 13.5–17.5)
Neutrophils Absolute: 3
Platelets: 210 (ref 150–399)
WBC: 4.8

## 2019-10-12 LAB — BASIC METABOLIC PANEL
BUN: 25 — AB (ref 4–21)
CO2: 30 — AB (ref 13–22)
Chloride: 106 (ref 99–108)
Creatinine: 1.6 — AB (ref 0.6–1.3)
Glucose: 115
Potassium: 4.3 (ref 3.4–5.3)
Sodium: 142 (ref 137–147)

## 2019-10-12 LAB — TSH: TSH: 0.35 — AB (ref 0.41–5.90)

## 2019-10-12 LAB — COMPREHENSIVE METABOLIC PANEL
Albumin: 3 — AB (ref 3.5–5.0)
Calcium: 9.1 (ref 8.7–10.7)

## 2019-10-12 LAB — CBC: RBC: 3.29 — AB (ref 3.87–5.11)

## 2019-10-16 ENCOUNTER — Telehealth: Payer: Self-pay | Admitting: *Deleted

## 2019-10-16 NOTE — Telephone Encounter (Signed)
I sent a home health order over

## 2019-10-16 NOTE — Telephone Encounter (Signed)
Per Shanda Bumps " TSH is still low, need to continue to reduce synthroid to 137 mcg and have TSH rescheduled in 8 weeks, also protein and albumin levels are low and needs to increase protein in diet, can add dietary supplement between meals ok with smallest meal daily"   Spoke with patient and advised result. pt want another order to get TSH in 8 weeks at [facility] (not sure how he got it before, he said to ask Shanda Bumps?)

## 2019-10-17 NOTE — Telephone Encounter (Signed)
Order written on order form for [name] Seller, NP to review and sign  Order will then be faxed to [phone], [facility]- Lab

## 2019-10-22 ENCOUNTER — Encounter: Payer: Self-pay | Admitting: Nurse Practitioner

## 2019-10-22 ENCOUNTER — Other Ambulatory Visit: Payer: Self-pay

## 2019-10-22 MED ORDER — LEVOTHYROXINE SODIUM 137 MCG PO TABS: 137.0000 ug | ORAL_TABLET | Freq: Every day | ORAL | 1 refills | Status: AC

## 2019-11-01 ENCOUNTER — Telehealth: Payer: Self-pay | Admitting: *Deleted

## 2019-11-01 NOTE — Telephone Encounter (Signed)
Emily with [facility] called and stated that patient did not want to start his PT evaluation this week because he wasn't feeling well. Wants to wait to next week. Verbal orders given to start week of 11/05/19.   I called patient to check on him and spoke with his son. His son stated that he had just spoken with patient last night and he was fine but he would call him to make sure he was ok. Informed son that if patient needs an appointment we could get him in today. Son agreed and stated that he would call back if he feels he needs to be evaluated.

## 2019-11-01 NOTE — Telephone Encounter (Signed)
Noted thank you

## 2019-11-09 ENCOUNTER — Other Ambulatory Visit: Payer: Self-pay | Admitting: Family

## 2019-11-09 ENCOUNTER — Other Ambulatory Visit: Payer: Self-pay | Admitting: Nurse Practitioner

## 2019-11-30 ENCOUNTER — Telehealth: Payer: Self-pay | Admitting: Cardiovascular Disease

## 2019-11-30 NOTE — Telephone Encounter (Signed)
   Primary Cardiologist: [name] Miss, MD  Chart reviewed as part of pre-operative protocol coverage. Patient was contacted 11/30/2019 in reference to pre-operative risk assessment for pending surgery as outlined below.  [name] [name] was last seen on 09/10/19 by Dr. Elease Hashimoto with telehealth visit.  Since that day, [name] [name] has done well with hx of AVR in 2016 with pericardial tissue valve for severe AI. He has permanent atrial fib, no longer on anticoagulation.   (I talked with the pt's son)  He does need dental prophylaxis with antibiotics with his aortic valve replacement.  The patient's son said they already have.    Therefore, based on ACC/AHA guidelines, the patient would be at acceptable risk for the planned procedure without further cardiovascular testing.   I will route this recommendation to the requesting party via Epic fax function and remove from pre-op pool.  Please call with questions.  Nada Boozer, NP 11/30/2019, 2:52 PM

## 2019-11-30 NOTE — Telephone Encounter (Signed)
New message   1. What dental office are you calling from? [facility]  2. What is your office phone number? [phone]  3. What is your fax number?[phone]  4. What type of procedure is the patient having performed? Extractions  5. What date is procedure scheduled or is the patient there now? TBD  (if the patient is at the dentist's office question goes to their cardiologist if he/she is in the office.  If not, question should go to the DOD).Need to get a medical clearance before extractions   6. What is your question (ex. Antibiotics prior to procedure, holding medication-we need to know how long dentist wants pt to hold med)? Want provider to decide if any medications need to be withheld.

## 2019-12-07 NOTE — Telephone Encounter (Signed)
Follow up   [facility] states that they did not receive the fax for the clearance. Please refax the clearance to the fax # on file.

## 2019-12-07 NOTE — Telephone Encounter (Signed)
[facility] and they still have not received the fax. I notified them that I will send a manual fax over to their office. She verbalized understanding and thanked me for the call.

## 2019-12-07 NOTE — Telephone Encounter (Signed)
I have re-faxed [name] [name] note.

## 2019-12-19 MED ORDER — CIPROFLOXACIN HCL 500 MG PO TABS
500.00 | ORAL_TABLET | ORAL | Status: DC
Start: 2019-12-18 — End: 2019-12-19

## 2019-12-19 MED ORDER — LEVOTHYROXINE SODIUM 137 MCG PO TABS
137.00 | ORAL_TABLET | ORAL | Status: DC
Start: 2019-12-19 — End: 2019-12-19

## 2019-12-19 MED ORDER — SODIUM CHLORIDE 0.9 % IV SOLN
10.00 | INTRAVENOUS | Status: DC
Start: ? — End: 2019-12-19

## 2019-12-19 MED ORDER — CARVEDILOL 12.5 MG PO TABS
12.50 | ORAL_TABLET | ORAL | Status: DC
Start: 2019-12-18 — End: 2019-12-19

## 2019-12-19 MED ORDER — CHLOROBUTANOL-EUGENOL & APAP
0.40 | Status: DC
Start: ? — End: 2019-12-19

## 2019-12-19 MED ORDER — GENERIC EXTERNAL MEDICATION
Status: DC
Start: ? — End: 2019-12-19

## 2019-12-19 MED ORDER — ACCU-PRO PUMP SET/VENT MISC
2.50 | Status: DC
Start: ? — End: 2019-12-19

## 2019-12-19 MED ORDER — GENERIC EXTERNAL MEDICATION
10.00 | Status: DC
Start: 2019-12-18 — End: 2019-12-19

## 2019-12-19 MED ORDER — GUAIFENESIN 100 MG/5ML PO SYRP
200.00 | ORAL_SOLUTION | ORAL | Status: DC
Start: ? — End: 2019-12-19

## 2019-12-19 MED ORDER — ESCITALOPRAM OXALATE 10 MG PO TABS
10.00 | ORAL_TABLET | ORAL | Status: DC
Start: 2019-12-19 — End: 2019-12-19

## 2019-12-19 MED ORDER — MEMANTINE HCL 10 MG PO TABS
10.00 | ORAL_TABLET | ORAL | Status: DC
Start: 2019-12-18 — End: 2019-12-19

## 2019-12-19 MED ORDER — GENERIC EXTERNAL MEDICATION
62.50 | Status: DC
Start: 2019-12-18 — End: 2019-12-19

## 2019-12-19 MED ORDER — POLYETHYLENE GLYCOL 3350 17 GM/SCOOP PO POWD
17.00 | ORAL | Status: DC
Start: ? — End: 2019-12-19

## 2019-12-19 MED ORDER — SURE COMFORT INSULIN SYRINGE 30G X 1/2" 0.5 ML MISC
25.00 | Status: DC
Start: 2019-12-19 — End: 2019-12-19

## 2019-12-20 ENCOUNTER — Other Ambulatory Visit: Payer: Self-pay | Admitting: Nurse Practitioner

## 2019-12-20 DIAGNOSIS — F329 Major depressive disorder, single episode, unspecified: Secondary | ICD-10-CM

## 2019-12-20 DIAGNOSIS — F32A Depression, unspecified: Secondary | ICD-10-CM

## 2020-02-06 ENCOUNTER — Ambulatory Visit: Payer: Medicare PPO | Admitting: Nurse Practitioner

## 2020-03-12 ENCOUNTER — Other Ambulatory Visit: Payer: Self-pay | Admitting: Nurse Practitioner

## 2020-03-12 DIAGNOSIS — F32A Depression, unspecified: Secondary | ICD-10-CM

## 2020-03-24 ENCOUNTER — Other Ambulatory Visit: Payer: Self-pay | Admitting: Nurse Practitioner

## 2020-03-24 DIAGNOSIS — F419 Anxiety disorder, unspecified: Secondary | ICD-10-CM

## 2020-03-25 NOTE — Telephone Encounter (Signed)
I called patients [name] and [name] [name] stated patient is no longer under the care of Sharon Seller, NP.   Patient is under the care of a [facility]ospice doctor and [name] [name] plans to contact [facility] with the new doctors information. [name] [name] could not recall the new doctors name at the time of call.  RX denied with the indication patient no longer under our care

## 2020-03-29 ENCOUNTER — Other Ambulatory Visit: Payer: Self-pay | Admitting: Cardiology

## 2020-05-09 ENCOUNTER — Other Ambulatory Visit: Payer: Self-pay | Admitting: Family

## 2020-10-10 ENCOUNTER — Other Ambulatory Visit: Payer: Self-pay | Admitting: Family

## 2020-10-10 ENCOUNTER — Other Ambulatory Visit: Payer: Self-pay

## 2020-10-10 ENCOUNTER — Other Ambulatory Visit: Payer: Self-pay | Admitting: Cardiology

## 2020-10-10 ENCOUNTER — Telehealth: Payer: Self-pay

## 2020-10-10 ENCOUNTER — Encounter: Admitting: Nurse Practitioner

## 2020-10-10 NOTE — Telephone Encounter (Signed)
Tried calling patient to start AWV. Patient did not answer and vm was left. Tried calling patient 3 times.  1st attempt 10:08am called mobile phone and went straight to vm 2nd attempt 10:36 am called home phone no answer and no vm 3rd attempt 10:42 am called mobile number again. Left vm stating that this was my 3rd attempt to reach patient and that he would need to call office to reschedule and also is Abbey Chatters, Np is no longer his PCP he would need to let us know.

## 2020-11-11 ENCOUNTER — Other Ambulatory Visit: Payer: Self-pay | Admitting: Cardiovascular Disease

## 2021-01-21 ENCOUNTER — Other Ambulatory Visit: Payer: Self-pay | Admitting: Family

## 2021-05-29 ENCOUNTER — Other Ambulatory Visit: Payer: Self-pay | Admitting: Nurse Practitioner

## 2021-05-29 NOTE — Telephone Encounter (Addendum)
Tried calling Son, Raiford Noble to clarify if patient is still our patient. Patient has not been seen in office since 10/08/2019

## 2021-07-02 ENCOUNTER — Other Ambulatory Visit: Payer: Self-pay | Admitting: Nurse Practitioner
